# Patient Record
Sex: Male | Born: 1945 | Race: White | Hispanic: No | Marital: Married | Smoking: Former smoker
Health system: Southern US, Community
[De-identification: ages and names within clinical notes are randomized; demographics above are authoritative.]

## PROBLEM LIST (undated history)

## (undated) DIAGNOSIS — Z860100 Personal history of colon polyps, unspecified: Secondary | ICD-10-CM

## (undated) DIAGNOSIS — H9190 Unspecified hearing loss, unspecified ear: Secondary | ICD-10-CM

## (undated) DIAGNOSIS — K219 Gastro-esophageal reflux disease without esophagitis: Secondary | ICD-10-CM

## (undated) DIAGNOSIS — T8859XA Other complications of anesthesia, initial encounter: Secondary | ICD-10-CM

## (undated) DIAGNOSIS — M797 Fibromyalgia: Secondary | ICD-10-CM

## (undated) DIAGNOSIS — M545 Low back pain, unspecified: Secondary | ICD-10-CM

## (undated) DIAGNOSIS — N186 End stage renal disease: Secondary | ICD-10-CM

## (undated) DIAGNOSIS — I77810 Thoracic aortic ectasia: Secondary | ICD-10-CM

## (undated) DIAGNOSIS — Z923 Personal history of irradiation: Secondary | ICD-10-CM

## (undated) DIAGNOSIS — Z9289 Personal history of other medical treatment: Secondary | ICD-10-CM

## (undated) DIAGNOSIS — I2510 Atherosclerotic heart disease of native coronary artery without angina pectoris: Secondary | ICD-10-CM

## (undated) DIAGNOSIS — I358 Other nonrheumatic aortic valve disorders: Secondary | ICD-10-CM

## (undated) DIAGNOSIS — G8929 Other chronic pain: Secondary | ICD-10-CM

## (undated) DIAGNOSIS — C320 Malignant neoplasm of glottis: Secondary | ICD-10-CM

## (undated) DIAGNOSIS — G629 Polyneuropathy, unspecified: Secondary | ICD-10-CM

## (undated) DIAGNOSIS — E7800 Pure hypercholesterolemia, unspecified: Secondary | ICD-10-CM

## (undated) DIAGNOSIS — G4730 Sleep apnea, unspecified: Secondary | ICD-10-CM

## (undated) DIAGNOSIS — I10 Essential (primary) hypertension: Secondary | ICD-10-CM

## (undated) DIAGNOSIS — Z8639 Personal history of other endocrine, nutritional and metabolic disease: Secondary | ICD-10-CM

## (undated) DIAGNOSIS — H269 Unspecified cataract: Secondary | ICD-10-CM

## (undated) DIAGNOSIS — I6529 Occlusion and stenosis of unspecified carotid artery: Secondary | ICD-10-CM

## (undated) DIAGNOSIS — I5042 Chronic combined systolic (congestive) and diastolic (congestive) heart failure: Secondary | ICD-10-CM

## (undated) DIAGNOSIS — I5189 Other ill-defined heart diseases: Secondary | ICD-10-CM

## (undated) DIAGNOSIS — T4145XA Adverse effect of unspecified anesthetic, initial encounter: Secondary | ICD-10-CM

## (undated) DIAGNOSIS — IMO0001 Reserved for inherently not codable concepts without codable children: Secondary | ICD-10-CM

## (undated) DIAGNOSIS — Z992 Dependence on renal dialysis: Secondary | ICD-10-CM

## (undated) DIAGNOSIS — I351 Nonrheumatic aortic (valve) insufficiency: Secondary | ICD-10-CM

## (undated) DIAGNOSIS — E119 Type 2 diabetes mellitus without complications: Secondary | ICD-10-CM

## (undated) DIAGNOSIS — M1990 Unspecified osteoarthritis, unspecified site: Secondary | ICD-10-CM

## (undated) HISTORY — DX: Thoracic aortic ectasia: I77.810

## (undated) HISTORY — DX: Fibromyalgia: M79.7

## (undated) HISTORY — DX: Essential (primary) hypertension: I10

## (undated) HISTORY — DX: Gastro-esophageal reflux disease without esophagitis: K21.9

## (undated) HISTORY — DX: Other ill-defined heart diseases: I51.89

## (undated) HISTORY — PX: COLONOSCOPY W/ BIOPSIES AND POLYPECTOMY: SHX1376

## (undated) HISTORY — DX: Other nonrheumatic aortic valve disorders: I35.8

## (undated) HISTORY — DX: Nonrheumatic aortic (valve) insufficiency: I35.1

## (undated) HISTORY — DX: Chronic combined systolic (congestive) and diastolic (congestive) heart failure: I50.42

## (undated) HISTORY — DX: Occlusion and stenosis of unspecified carotid artery: I65.29

## (undated) HISTORY — PX: CAROTID ENDARTERECTOMY: SUR193

---

## 1991-06-23 HISTORY — PX: LAPAROSCOPIC CHOLECYSTECTOMY: SUR755

## 2000-03-31 ENCOUNTER — Encounter: Admission: RE | Admit: 2000-03-31 | Discharge: 2000-06-29 | Payer: Self-pay

## 2005-05-15 ENCOUNTER — Encounter: Admission: RE | Admit: 2005-05-15 | Discharge: 2005-05-15 | Payer: Self-pay | Admitting: Nephrology

## 2011-03-23 ENCOUNTER — Encounter: Payer: Self-pay | Admitting: Vascular Surgery

## 2011-03-24 ENCOUNTER — Ambulatory Visit (INDEPENDENT_AMBULATORY_CARE_PROVIDER_SITE_OTHER): Payer: Medicare Other | Admitting: Vascular Surgery

## 2011-03-24 ENCOUNTER — Encounter: Payer: Self-pay | Admitting: Vascular Surgery

## 2011-03-24 VITALS — BP 144/75 | HR 75 | Resp 20 | Ht 70.5 in | Wt 210.5 lb

## 2011-03-24 DIAGNOSIS — I6529 Occlusion and stenosis of unspecified carotid artery: Secondary | ICD-10-CM

## 2011-03-24 NOTE — Progress Notes (Signed)
Subjective:     Patient ID: Victor Lane, male   DOB: [birth year], 65 y.o.   MRN: [id number]  HPI [facility] [place] HISTORY AND PHYSICAL   VQ:[id number] norrisReferring Physician:Traci Turner History of Present Illness: This 65 year old male patient was referred by Dr. Mayford Knife for left carotid occlusive the patient has right shoulder issues and needs rotator cuff repair by Dr. Malon Kindle to Dr. Mayford Knife was seen the patient for cardiac clearance and heard a left carotid bruit she ordered a carotid duplex exam. This revealed an 80-99% left internal carotid stenosis with mild disease on the contralateral right side. Patient denies any neurologic symptoms such as lateralizing weakness, amaurosis fugax, diplopia, blurred vision, syncope, or previous stroke. He also denies any recent chest pain or dyspnea on exertion. Her cardiac workup was unremarkable with no evidence of significant coronary artery disease. Past Medical History  Diagnosis Date  . Diabetes mellitus   . Hypertension   . GERD (gastroesophageal reflux disease)   . Fibromyalgia   . Chronic kidney disease   . Chest pain   . Carotid artery occlusion     ROS: [x]  Positive   [ ]  Negative   [ ]  All sytems reviewed and are negative  General: [ ]  Weight loss, [ ]  Fever, [ ]  chills Neurologic: [ ]  Dizziness, [ ]  Blackouts, [ ]  Seizure positive for headache [ ]  Stroke, [ ]  "Mini stroke", [ ]  Slurred speech, [ ]  Temporary blindness; [ ]  weakness in arms or legs, [ ]  Hoarseness Cardiac: [ ]  Chest pain/pressure, [ ]  Shortness of breath at rest [x ] Shortness of breath with exertion, [ ]  Atrial fibrillation or irregular heartbeat Vascular: [ ]  Pain in legs with walking, [ ]  Pain in legs at rest, [ ]  Pain in legs at night,  [ ]  Non-healing ulcer, [ ]  Blood clot in vein/DVT,   Pulmonary: [ ]  Home oxygen, [ ]  Productive cough, [ ]  Coughing up blood, [ ]  Asthma,  [ ]  Wheezing Musculoskeletal:  [ ]  Arthritis, [ ]  Low back  pain, [x ] Joint pain Hematologic: [ ]  Easy Bruising, [ ]  Anemia; [ ]  Hepatitis Gastrointestinal: [ ]  Blood in stool, [ ]  Gastroesophageal Reflux/heartburn, [ ]  Trouble swallowing Urinary: [x chronic Kidney disease, [ ]  on HD - [ ]  MWF or [ ]  TTHS, [ ]  Burning with urination, [ ]  Difficulty urinating Skin: [ ]  Rashes, [ ]  Wounds Psychological: [ ]  Anxiety, [ ]  Depression   Social History History  Substance Use Topics  . Smoking status: Former Smoker -- 2.0 packs/day for 31 years    Types: Cigarettes    Quit date: 06/22/1996  . Smokeless tobacco: Not on file  . Alcohol Use: Yes     occasional    Family History Family History  Problem Relation Age of Onset  . Adopted: Yes    Allergies  Allergen Reactions  . Amoxicillin     Causes upset stomach    Current Outpatient Prescriptions  Medication Sig Dispense Refill  . amLODipine (NORVASC) 5 MG tablet Take 5 mg by mouth daily.        Marland Kitchen aspirin 81 MG tablet Take 81 mg by mouth daily.        . cyclobenzaprine (FLEXERIL) 10 MG tablet Take 10 mg by mouth at bedtime.        Marland Kitchen glyBURIDE (DIABETA) 2.5 MG tablet Take 2.5 mg by mouth daily with breakfast.        .  lisinopril-hydrochlorothiazide (PRINZIDE,ZESTORETIC) 20-12.5 MG per tablet Take 0.5 tablets by mouth daily.        . metoprolol tartrate (LOPRESSOR) 25 MG tablet Take 25 mg by mouth 2 (two) times daily.        . Omega-3 Fatty Acids (FISH OIL) 1000 MG CAPS Take 4 capsules by mouth daily.        Marland Kitchen omeprazole (PRILOSEC) 20 MG capsule Take 20 mg by mouth daily.        . traMADol (ULTRAM) 50 MG tablet Take 100 mg by mouth 2 (two) times daily.          Physical Examination  Filed Vitals:   03/24/11 1255  BP: 144/75  Pulse:   Resp:     Body mass index is 29.78 kg/(m^2).  General:  WDWN in NAD Gait: Normal HENT: WNL Eyes: Pupils equal Pulmonary: normal non-labored breathing , without Rales, rhonchi,  wheezing Cardiac: RRR, without  Murmurs, rubs or gallops; left carotid  bruit-harsh Abdomen: soft, NT, no masses Skin: no rashes, ulcers noted Vascular Exam/Pulses:  Extremities without ischemic changes, no Gangrene , no cellulitis; no open wounds;  Musculoskeletal: no muscle wasting or atrophy pain in right shoulder with motion  Neurologic: A&O X 3; Appropriate Affect ; SENSATION: normal; MOTOR FUNCTION:  moving all extremities equally. Speech is fluent/normal  Non-Invasive Vascular Imaging: Today I ordered a left carotid duplex exam which I reviewed and interpreted to confirm the above findings. I agree that the patient does have an 80% left internal carotid stenosis. ASSESSMENT: Asymptomatic 80% left internal carotid stenosis PLAN: Left carotid endarterectomy on October 10 at [facility] risks and benefits of then thoroughly discussed with the patient and his wife and they both would like to proceed  Review of Systems     Objective:   Physical Exam     Assessment:        Plan:

## 2011-03-27 ENCOUNTER — Ambulatory Visit (HOSPITAL_COMMUNITY)
Admission: RE | Admit: 2011-03-27 | Discharge: 2011-03-27 | Disposition: A | Discharge status: Home / Self Care | Payer: Medicare Other | Source: Ambulatory Visit | Attending: Vascular Surgery | Admitting: Vascular Surgery

## 2011-03-27 ENCOUNTER — Other Ambulatory Visit: Payer: Self-pay | Admitting: Vascular Surgery

## 2011-03-27 ENCOUNTER — Encounter (HOSPITAL_COMMUNITY)
Admission: RE | Admit: 2011-03-27 | Discharge: 2011-03-27 | Disposition: A | Discharge status: Home / Self Care | Payer: Medicare Other | Source: Ambulatory Visit | Attending: Vascular Surgery | Admitting: Vascular Surgery

## 2011-03-27 DIAGNOSIS — Z01811 Encounter for preprocedural respiratory examination: Secondary | ICD-10-CM

## 2011-03-27 DIAGNOSIS — Z01812 Encounter for preprocedural laboratory examination: Secondary | ICD-10-CM | POA: Insufficient documentation

## 2011-03-27 DIAGNOSIS — Z01810 Encounter for preprocedural cardiovascular examination: Secondary | ICD-10-CM | POA: Insufficient documentation

## 2011-03-27 DIAGNOSIS — Z01818 Encounter for other preprocedural examination: Secondary | ICD-10-CM | POA: Insufficient documentation

## 2011-03-27 LAB — URINALYSIS, ROUTINE W REFLEX MICROSCOPIC
Bilirubin Urine: NEGATIVE
Glucose, UA: NEGATIVE mg/dL
Hgb urine dipstick: NEGATIVE
Ketones, ur: NEGATIVE mg/dL
Leukocytes, UA: NEGATIVE
Nitrite: NEGATIVE
Protein, ur: >300 mg/dL — AB
Specific Gravity, Urine: 1.014 (ref 1.005–1.030)
Urobilinogen, UA: 0.2 mg/dL (ref 0.0–1.0)
pH: 6 (ref 5.0–8.0)

## 2011-03-27 LAB — PROTIME-INR
INR: 0.96 (ref 0.00–1.49)
Prothrombin Time: 13 seconds (ref 11.6–15.2)

## 2011-03-27 LAB — COMPREHENSIVE METABOLIC PANEL
ALT: 17 U/L (ref 0–53)
AST: 15 U/L (ref 0–37)
Alkaline Phosphatase: 75 U/L (ref 39–117)
BUN: 53 mg/dL — ABNORMAL HIGH (ref 6–23)
CO2: 26 mEq/L (ref 19–32)
Calcium: 9.9 mg/dL (ref 8.4–10.5)
Creatinine, Ser: 3.09 mg/dL — ABNORMAL HIGH (ref 0.50–1.35)
GFR calc Af Amer: 23 mL/min — ABNORMAL LOW (ref >90)
GFR calc non Af Amer: 20 mL/min — ABNORMAL LOW (ref >90)
Glucose, Bld: 125 mg/dL — ABNORMAL HIGH (ref 70–99)
Sodium: 137 mEq/L (ref 135–145)
Total Bilirubin: 0.3 mg/dL (ref 0.3–1.2)
Total Protein: 6.6 g/dL (ref 6.0–8.3)

## 2011-03-27 LAB — CBC
HCT: 40 % (ref 39.0–52.0)
Hemoglobin: 13.8 g/dL (ref 13.0–17.0)
MCHC: 34.5 g/dL (ref 30.0–36.0)
RDW: 13.8 % (ref 11.5–15.5)
WBC: 7.3 10*3/uL (ref 4.0–10.5)

## 2011-03-27 LAB — SURGICAL PCR SCREEN
MRSA, PCR: NEGATIVE
Staphylococcus aureus: NEGATIVE

## 2011-03-27 LAB — APTT: aPTT: 36 seconds (ref 24–37)

## 2011-03-27 LAB — ABO/RH: ABO/RH(D): O POS

## 2011-03-30 NOTE — Procedures (Unsigned)
CAROTID DUPLEX EXAM  INDICATION:  Carotid stenosis.  HISTORY: Diabetes:  Yes. Cardiac:  No. Hypertension:  Yes. Smoking:  Previous. Previous Surgery:  No carotid intervention. CV History:  Asymptomatic. Amaurosis Fugax No, Paresthesias No, Hemiparesis No.                                      RIGHT             LEFT Brachial systolic pressure:         144               140 Brachial Doppler waveforms:         WNL               WNL Vertebral direction of flow:                          Antegrade DUPLEX VELOCITIES (cm/sec) CCA peak systolic                                     97 ECA peak systolic                                     359 ICA peak systolic                                     321 - p/115 - m ICA end diastolic                                     83 - p/25 - m PLAQUE MORPHOLOGY:                                    Calcified PLAQUE AMOUNT:                                        Moderate to severe PLAQUE LOCATION:                                      CCA/ICA/ECA  IMPRESSION: 1. Left internal carotid artery stenosis in the 60% to 79% range (high     end of range), may be underestimated due to dense calcified plaque,     making Doppler interrogation difficult. 2. Left external carotid artery stenosis present. 3. Left vertebral artery is patent and antegrade.  ___________________________________________ Quita Skye. Hart Rochester, M.D.  SH/MEDQ  D:  03/24/2011  T:  03/24/2011  Job:  [id number]

## 2011-04-01 ENCOUNTER — Inpatient Hospital Stay (HOSPITAL_COMMUNITY)
Admission: RE | Admit: 2011-04-01 | Discharge: 2011-04-02 | DRG: 039 | Disposition: A | Discharge status: Home / Self Care | Payer: Medicare Other | Source: Ambulatory Visit | Attending: Vascular Surgery | Admitting: Vascular Surgery

## 2011-04-01 ENCOUNTER — Other Ambulatory Visit: Payer: Self-pay | Admitting: Vascular Surgery

## 2011-04-01 DIAGNOSIS — J4489 Other specified chronic obstructive pulmonary disease: Secondary | ICD-10-CM | POA: Diagnosis present

## 2011-04-01 DIAGNOSIS — IMO0001 Reserved for inherently not codable concepts without codable children: Secondary | ICD-10-CM | POA: Diagnosis present

## 2011-04-01 DIAGNOSIS — Z87891 Personal history of nicotine dependence: Secondary | ICD-10-CM

## 2011-04-01 DIAGNOSIS — Z01812 Encounter for preprocedural laboratory examination: Secondary | ICD-10-CM

## 2011-04-01 DIAGNOSIS — E669 Obesity, unspecified: Secondary | ICD-10-CM | POA: Diagnosis present

## 2011-04-01 DIAGNOSIS — E119 Type 2 diabetes mellitus without complications: Secondary | ICD-10-CM | POA: Diagnosis present

## 2011-04-01 DIAGNOSIS — I6529 Occlusion and stenosis of unspecified carotid artery: Secondary | ICD-10-CM

## 2011-04-01 DIAGNOSIS — I129 Hypertensive chronic kidney disease with stage 1 through stage 4 chronic kidney disease, or unspecified chronic kidney disease: Secondary | ICD-10-CM | POA: Diagnosis present

## 2011-04-01 DIAGNOSIS — N189 Chronic kidney disease, unspecified: Secondary | ICD-10-CM | POA: Diagnosis present

## 2011-04-01 DIAGNOSIS — K219 Gastro-esophageal reflux disease without esophagitis: Secondary | ICD-10-CM | POA: Diagnosis present

## 2011-04-01 DIAGNOSIS — Z79899 Other long term (current) drug therapy: Secondary | ICD-10-CM

## 2011-04-01 DIAGNOSIS — Z01810 Encounter for preprocedural cardiovascular examination: Secondary | ICD-10-CM

## 2011-04-01 DIAGNOSIS — J449 Chronic obstructive pulmonary disease, unspecified: Secondary | ICD-10-CM | POA: Diagnosis present

## 2011-04-01 DIAGNOSIS — Z7982 Long term (current) use of aspirin: Secondary | ICD-10-CM

## 2011-04-01 LAB — GLUCOSE, CAPILLARY
Glucose-Capillary: 150 mg/dL — ABNORMAL HIGH (ref 70–99)
Glucose-Capillary: 121 mg/dL — ABNORMAL HIGH (ref 70–99)
Glucose-Capillary: 165 mg/dL — ABNORMAL HIGH (ref 70–99)
Glucose-Capillary: 153 mg/dL — ABNORMAL HIGH (ref 70–99)
Glucose-Capillary: 192 mg/dL — ABNORMAL HIGH (ref 70–99)

## 2011-04-01 LAB — BASIC METABOLIC PANEL
BUN: 65 mg/dL — ABNORMAL HIGH (ref 6–23)
CO2: 21 mEq/L (ref 19–32)
Calcium: 9.7 mg/dL (ref 8.4–10.5)
Chloride: 101 mEq/L (ref 96–112)
Creatinine, Ser: 4.06 mg/dL — ABNORMAL HIGH (ref 0.50–1.35)
GFR calc Af Amer: 16 mL/min — ABNORMAL LOW (ref >90)
GFR calc non Af Amer: 14 mL/min — ABNORMAL LOW (ref >90)
Glucose, Bld: 134 mg/dL — ABNORMAL HIGH (ref 70–99)
Potassium: 5 mEq/L (ref 3.5–5.1)
Sodium: 136 mEq/L (ref 135–145)

## 2011-04-02 LAB — BASIC METABOLIC PANEL
BUN: 52 mg/dL — ABNORMAL HIGH (ref 6–23)
Calcium: 8.5 mg/dL (ref 8.4–10.5)
Chloride: 106 mEq/L (ref 96–112)
Creatinine, Ser: 3.19 mg/dL — ABNORMAL HIGH (ref 0.50–1.35)
GFR calc Af Amer: 22 mL/min — ABNORMAL LOW (ref >90)
GFR calc non Af Amer: 19 mL/min — ABNORMAL LOW (ref >90)

## 2011-04-02 LAB — CBC
HCT: 31.7 % — ABNORMAL LOW (ref 39.0–52.0)
MCH: 29.8 pg (ref 26.0–34.0)
MCHC: 33.8 g/dL (ref 30.0–36.0)
MCV: 88.3 fL (ref 78.0–100.0)
RDW: 14.1 % (ref 11.5–15.5)

## 2011-04-06 LAB — CROSSMATCH
ABO/RH(D): O POS
Antibody Screen: NEGATIVE
Unit division: 0
Unit division: 0

## 2011-04-13 NOTE — Discharge Summary (Signed)
  NAME:  Victor Lane, Victor Lane NO.:  [id number]  MEDICAL RECORD NO.:  [id number]  LOCATION:  3315                         FACILITY:  [facility]  PHYSICIAN:  Quita Skye. Hart Rochester, M.D.  DATE OF BIRTH:  06-03-1946  DATE OF ADMISSION:  04/01/2011 DATE OF DISCHARGE:                              DISCHARGE SUMMARY   ADMIT DIAGNOSIS:  Left internal carotid artery stenosis.  PAST MEDICAL HISTORY AND DISCHARGE DIAGNOSES: 1. Left internal carotid artery stenosis, status post left carotid     endarterectomy. 2. Diabetes mellitus. 3. Hypertension. 4. Gastroesophageal reflux disease. 5. Fibromyalgia. 6. Chronic kidney disease. 7. Chest pain.  BRIEF HISTORY:  The patient is a 65 year old male who was evaluated by Dr. Hart Rochester as an outpatient secondary to left internal carotid artery stenosis found on cardiac clearance exam by Dr. Mayford Knife.  Carotid duplex revealed 80-99% left internal carotid artery stenosis which was asymptomatic.  Secondary to these findings, Dr. Hart Rochester felt the patient to proceed with carotid endarterectomy for stroke risk reduction.  HOSPITAL COURSE:  The patient was admitted and taken to the OR on April 01, 2011, for a left carotid endarterectomy with Dacron patch angioplasty.  The patient tolerated the procedure well and was hemodynamically stable immediately postoperatively.  He was extubated without complication and woke up from anesthesia neurologically intact. The patient was transferred to the floor without difficulty. The patient's postoperative course has progressed as expected.  On postoperative day 1, he has been able to tolerate ambulation, voiding, and a regular diet all without difficulty.  He is without complaint.  He has remained afebrile with stable vital signs.  PHYSICAL EXAMINATION:  CARDIAC:  Regular rate and rhythm. LUNGS:  Clear to auscultation. ABDOMEN:  Soft with active bowel sounds.   INCISION: clean, dry and intact with no evidence of  erythema. NEUROLOGIC:  He is neurologically intact with normal speech and his tongue is midline. The patient is in stable condition at this time and is felt ready for discharge home.  LABORATORY DATA:  CBC and BMP on April 02, 2011, white count 6.6, hemoglobin 10.7, hematocrit 31.7 and platelets 128.  Sodium 135, potassium 4.4, BUN 52 and creatinine 3.19.  The patient received specific written discharge instructions regarding diet, activity and wound care.  He will follow up with Dr. Hart Rochester approximately 2 weeks after discharge.  MEDICATIONS: 1. Percocet 5/325 mg 1-2 q.4-6 h. p.r.n. pain #30, no refills. 2. Cyclobenzaprine 10 mg at bedtime. 3. Prilosec 20 mg daily. 4. Lisinopril/hydrochlorothiazide 20/12.5 mg one and half tab daily. 5. Amlodipine 5 mg daily. 6. Glyburide 2.5 mg daily with meals. 7. Fish oil 1000 mg 2 tabs b.i.d. 8. Aspirin 81 mg daily. 9. Metoprolol 25 mg b.i.d. 10.The patient is instructed to stop tramadol 50 mg 2 tabs p.o. b.i.d.     Pecola Leisure, PA   ______________________________ Quita Skye Hart Rochester, M.D.    AY/MEDQ  D:  04/02/2011  T:  04/02/2011  Job:  [id number]  Electronically Signed by Pecola Leisure PA on 04/10/2011 09:51:26 AM Electronically Signed by Josephina Gip M.D. on 04/13/2011 10:08:17 AM

## 2011-04-13 NOTE — Op Note (Signed)
NAME:  Victor Lane, Victor Lane NO.:  [id number]  MEDICAL RECORD NO.:  [id number]  LOCATION:  3315                         FACILITY:  [facility]  PHYSICIAN:  Quita Skye. Hart Rochester, M.D.  DATE OF BIRTH:  [birth year]  DATE OF PROCEDURE:  04/01/2011 DATE OF DISCHARGE:                              OPERATIVE REPORT   PREOPERATIVE DIAGNOSIS:  Severe left internal carotid stenosis - asymptomatic.  POSTOPERATIVE DIAGNOSIS:  Severe left internal carotid stenosis - asymptomatic.  OPERATION:  Left carotid endarterectomy with Dacron patch angioplasty.  SURGEON:  Quita Skye. Hart Rochester, MD  FIRST ASSISTANT:  Janetta Hora. Fields, MD  SECOND ASSISTANT:  Pecola Leisure, PA.  ANESTHESIA:  General endotracheal.  BRIEF HISTORY:  This patient was found to have a left carotid bruit and a carotid duplex exam revealed an 80-90% left internal carotid stenosis and mild disease in the right internal carotid artery.  He was asymptomatic, had  no history of stroke or TIAs, was scheduled for left carotid endarterectomy.  PROCEDURE:  The patient was taken to the operating room, placed in the supine position, at which time, satisfactory general endotracheal anesthesia was administered.  Left neck was prepped with Betadine scrub and solution, and draped in a routine sterile manner.  Incision was made along the anterior border of the sternocleidomastoid muscle, carried down through the subcutaneous tissue and platysma using the Bovie. Common facial vein and external jugular veins were ligated with 3-0 silk ties and divided exposing the common internal and external carotid arteries.  Care was taken not to injure the vagus or hypoglossal nerves, both of which were exposed.  There was calcified atherosclerotic plaque at the carotid bifurcation extending up about 3 cm in the internal carotid.  The distal vessel appeared normal.  A #10 shunt was prepared and the patient was heparinized.  Carotid vessels were  occluded with vascular clamps.  Longitudinal opening was made in the common carotid with a 15-blade extending up in the internal carotid with Potts scissors to a point distal to the disease.  The plaque was about 80-90% stenotic in severity and there was excellent backbleeding from both.  A #10 shunt was inserted without difficulty reestablishing flow in about 2 minutes. Standard endarterectomy was then performed using the elevator and Potts scissors.  Eversion endarterectomy of the external carotid artery was performed.  The plaque was feathered off of the distal internal carotid artery nicely not requiring any tacking sutures.  The lumen was thoroughly irrigated with heparin saline.  All loose debris were carefully removed.  Arteriotomy was closed with a patch using continuous 6-0 Prolene.  Prior to completion of the closure, the shunt was removed after 30 minutes of shunt time following antegrade and retrograde flushing.  Closure was completed with reestablishment of flow initially up the external and up the internal branch.  The carotid was occluded for less than 2 minutes for removal of the shunt.  Protamine was then given to reverse the heparin.  Following adequate hemostasis, wound was irrigated with saline, closed in layers with Vicryl in a subcuticular fashion.  Sterile dressing was applied.  The patient was taken to the recovery room in satisfactory condition.  Quita Skye Hart Rochester, M.D.     JDL/MEDQ  D:  04/01/2011  T:  04/01/2011  Job:  [id number]  Electronically Signed by Josephina Gip M.D. on 04/13/2011 10:08:14 AM

## 2011-04-20 ENCOUNTER — Encounter: Payer: Self-pay | Admitting: Vascular Surgery

## 2011-04-21 ENCOUNTER — Ambulatory Visit (INDEPENDENT_AMBULATORY_CARE_PROVIDER_SITE_OTHER): Payer: Medicare Other | Admitting: Vascular Surgery

## 2011-04-21 ENCOUNTER — Encounter: Payer: Self-pay | Admitting: Vascular Surgery

## 2011-04-21 VITALS — BP 134/68 | HR 83 | Resp 20 | Ht 70.5 in | Wt 210.0 lb

## 2011-04-21 DIAGNOSIS — I6529 Occlusion and stenosis of unspecified carotid artery: Secondary | ICD-10-CM

## 2011-04-21 NOTE — Progress Notes (Signed)
Subjective:     Patient ID: Victor Lane, male   DOB: [birth year], 65 y.o.   MRN: [id number]  HPI this 65 year old male patient is to half weeks post left carotid endarterectomy for a severe but asymptomatic left internal carotid stenosis. As was discovered prior to right shoulder surgery which will need to be done by Dr. Beverely Low. Patient denies any hoarseness, aphasia, difficulty swallowing, or other neurologic symptoms since his surgery. He does have a raspy voice which he had preoperatively. He has had no chest pain, dyspnea on exertion, PND, or orthopnea. He is taking aspirin daily.  Review of Systems     Objective:   Physical Exam blood pressure 144/75 heart rate 75 respirations 16 Gen. he is well-developed well-nourished male in no apparent stress Left neck incision is healed nicely Neurologic exam normal 3+ carotid pulses bilaterally with no audible bruits    Assessment:     Doing well post left carotid endarterectomy for an asymptomatic severe stenosis    Plan:     Okay to proceed with right shoulder surgery at discretion of the patient and Dr.Norris Return to see me in 6 months with followup carotid duplex exam at that time Okay to discontinue aspirin therapy in the peri-operative. 4 right shoulder surgery and then resume post-op

## 2011-04-21 NOTE — Progress Notes (Signed)
Addended by: Sharee Pimple on: 04/21/2011 01:39 PM   Modules accepted: Orders

## 2011-07-06 HISTORY — PX: SHOULDER OPEN ROTATOR CUFF REPAIR: SHX2407

## 2011-10-19 ENCOUNTER — Encounter: Payer: Self-pay | Admitting: Vascular Surgery

## 2011-10-20 ENCOUNTER — Ambulatory Visit (INDEPENDENT_AMBULATORY_CARE_PROVIDER_SITE_OTHER): Payer: Medicare Other | Admitting: Thoracic Diseases

## 2011-10-20 ENCOUNTER — Encounter: Payer: Self-pay | Admitting: Vascular Surgery

## 2011-10-20 ENCOUNTER — Other Ambulatory Visit (INDEPENDENT_AMBULATORY_CARE_PROVIDER_SITE_OTHER): Payer: Medicare Other | Admitting: *Deleted

## 2011-10-20 VITALS — BP 147/82 | HR 74 | Temp 98.3°F | Ht 70.0 in | Wt 207.0 lb

## 2011-10-20 DIAGNOSIS — I6529 Occlusion and stenosis of unspecified carotid artery: Secondary | ICD-10-CM

## 2011-10-20 DIAGNOSIS — Z48812 Encounter for surgical aftercare following surgery on the circulatory system: Secondary | ICD-10-CM

## 2011-10-20 NOTE — Progress Notes (Signed)
[facility] CAROTID FOLLOW-UP  Date of Surgery: 04/01/11  L CEA Surgeon: Betti Cruz, MD  HPI: Victor Lane is a 66 y.o. male right handed who has known carotid disease. Patient is doing well. Pt. has had surgical intervention of left CEA . He C/O some continued numbness in his chin.  Patient has Negative history of TIA or stroke symptom.  The patient denies amaurosis fugax or monocular blindness.  The patient  denies facial drooping.   Pt. denies headache Pt. denies hemiplegia.  The patient denies receptive or expressive aphasia.  Pt. denies weakness in BUE/BLE except decreased motion right shoulder sec to rotator cuff surgery.    Non-Invasive Vascular Imaging CAROTID DUPLEX 10/20/2011  Right ICA 20 - 39 % stenosis Left ICA 0 - 19% stenosis  These findings are Improved from previous exam  Physical Exam: Filed Vitals:   10/20/11 1527  BP: 147/82  Pulse: 74  Temp: 98.3 F (36.8 C)  TempSrc: Oral  Height: 5\' 10"  (1.778 m)  Weight: 207 lb (93.895 kg)    Pt is A&O x 3 Gait is normal  Neuro Exam: Speech is fluent Negative weakness BUE/BLE Negative tongue deviation Negative facial droop  Plan: Follow-up in 6 months with Carotid Duplex scan and NP visit  Pt was given information regarding stroke symptoms and prevention  Clinic MD: JDL

## 2011-10-27 NOTE — Procedures (Unsigned)
CAROTID DUPLEX EXAM  INDICATION:  Follow up left CEA.  HISTORY: Diabetes:  Yes. Cardiac:  No. Hypertension:  Yes. Smoking:  Previous. Previous Surgery:  Left CEA 04/01/11. CV History: Amaurosis Fugax No, Paresthesias No, Hemiparesis No.                                      RIGHT             LEFT Brachial systolic pressure:         148               155 Brachial Doppler waveforms:         WNL               WNL Vertebral direction of flow:        Antegrade         Antegrade DUPLEX VELOCITIES (cm/sec) CCA peak systolic                   77                81 ECA peak systolic                   188               124 ICA peak systolic                   115               47 ICA end diastolic                   29                17 PLAQUE MORPHOLOGY:                  Heterogenous PLAQUE AMOUNT:                      Mild              None PLAQUE LOCATION:                    ECA, ICA  IMPRESSION: 1. 1% to 39% right internal carotid artery plaquing. 2. Widely patent left carotid endarterectomy without evidence of     restenosis or hyperplasia. 3. Bilateral vertebral arteries are within normal limits.  ___________________________________________ Quita Skye Hart Rochester, M.D.  LT/MEDQ  D:  10/20/2011  T:  10/20/2011  Job:  [id number]

## 2012-03-04 ENCOUNTER — Other Ambulatory Visit: Payer: Self-pay

## 2012-03-04 DIAGNOSIS — N184 Chronic kidney disease, stage 4 (severe): Secondary | ICD-10-CM

## 2012-03-04 DIAGNOSIS — Z01810 Encounter for preprocedural cardiovascular examination: Secondary | ICD-10-CM

## 2012-03-22 ENCOUNTER — Encounter: Payer: Self-pay | Admitting: Cardiology

## 2012-04-19 ENCOUNTER — Other Ambulatory Visit: Payer: Medicare Other

## 2012-04-19 ENCOUNTER — Ambulatory Visit: Payer: Medicare Other | Admitting: Vascular Surgery

## 2012-04-19 ENCOUNTER — Ambulatory Visit: Payer: Medicare Other | Admitting: Neurosurgery

## 2012-08-22 ENCOUNTER — Other Ambulatory Visit: Payer: Self-pay | Admitting: Gastroenterology

## 2012-09-21 ENCOUNTER — Encounter: Payer: Self-pay | Admitting: Transplant Surgery

## 2013-01-03 ENCOUNTER — Other Ambulatory Visit: Payer: Self-pay | Admitting: Otolaryngology

## 2013-01-03 ENCOUNTER — Encounter (HOSPITAL_COMMUNITY): Payer: Self-pay | Admitting: *Deleted

## 2013-01-03 ENCOUNTER — Encounter (HOSPITAL_COMMUNITY): Payer: Self-pay | Admitting: Pharmacy Technician

## 2013-01-03 NOTE — Progress Notes (Addendum)
Pt states that he had an EKG with Dr. Eliott Nine ( cardiologist) at [facility] possibly less that a year ago. Pt states that he also had  an EKG, stress test, and echo in feb 2014 at [facility]; request results DOS. Pt denies SOB, chest pain, and being under the care of a cardiologist.  Pt made aware to stop taking Aspirin and herbal medication (Omega-3 Fatty Acids (FISH OIL) 1000 MG CAPS  . Do not take any NSAIDs ie: Ibuprofen, Advil, Naproxen or any medication containing Aspirin.

## 2013-01-04 ENCOUNTER — Ambulatory Visit (HOSPITAL_COMMUNITY): Payer: Medicare Other | Admitting: Anesthesiology

## 2013-01-04 ENCOUNTER — Other Ambulatory Visit: Payer: Self-pay | Admitting: Otolaryngology

## 2013-01-04 ENCOUNTER — Encounter (HOSPITAL_COMMUNITY): Payer: Self-pay | Admitting: *Deleted

## 2013-01-04 ENCOUNTER — Encounter (HOSPITAL_COMMUNITY): Payer: Self-pay | Admitting: Anesthesiology

## 2013-01-04 ENCOUNTER — Ambulatory Visit (HOSPITAL_COMMUNITY)
Admission: RE | Admit: 2013-01-04 | Discharge: 2013-01-04 | Disposition: A | Discharge status: Home / Self Care | Payer: Medicare Other | Source: Ambulatory Visit | Attending: Otolaryngology | Admitting: Otolaryngology

## 2013-01-04 ENCOUNTER — Encounter (HOSPITAL_COMMUNITY)
Admission: RE | Disposition: A | Discharge status: Home / Self Care | Payer: Self-pay | Source: Ambulatory Visit | Attending: Otolaryngology

## 2013-01-04 ENCOUNTER — Ambulatory Visit (HOSPITAL_COMMUNITY): Payer: Medicare Other

## 2013-01-04 DIAGNOSIS — G609 Hereditary and idiopathic neuropathy, unspecified: Secondary | ICD-10-CM | POA: Insufficient documentation

## 2013-01-04 DIAGNOSIS — I120 Hypertensive chronic kidney disease with stage 5 chronic kidney disease or end stage renal disease: Secondary | ICD-10-CM | POA: Insufficient documentation

## 2013-01-04 DIAGNOSIS — Z79899 Other long term (current) drug therapy: Secondary | ICD-10-CM | POA: Insufficient documentation

## 2013-01-04 DIAGNOSIS — K219 Gastro-esophageal reflux disease without esophagitis: Secondary | ICD-10-CM | POA: Insufficient documentation

## 2013-01-04 DIAGNOSIS — Z87891 Personal history of nicotine dependence: Secondary | ICD-10-CM | POA: Insufficient documentation

## 2013-01-04 DIAGNOSIS — G709 Myoneural disorder, unspecified: Secondary | ICD-10-CM | POA: Insufficient documentation

## 2013-01-04 DIAGNOSIS — E119 Type 2 diabetes mellitus without complications: Secondary | ICD-10-CM | POA: Insufficient documentation

## 2013-01-04 DIAGNOSIS — I739 Peripheral vascular disease, unspecified: Secondary | ICD-10-CM | POA: Insufficient documentation

## 2013-01-04 DIAGNOSIS — C320 Malignant neoplasm of glottis: Secondary | ICD-10-CM | POA: Insufficient documentation

## 2013-01-04 DIAGNOSIS — G4730 Sleep apnea, unspecified: Secondary | ICD-10-CM | POA: Insufficient documentation

## 2013-01-04 DIAGNOSIS — IMO0001 Reserved for inherently not codable concepts without codable children: Secondary | ICD-10-CM | POA: Insufficient documentation

## 2013-01-04 DIAGNOSIS — M129 Arthropathy, unspecified: Secondary | ICD-10-CM | POA: Insufficient documentation

## 2013-01-04 DIAGNOSIS — N185 Chronic kidney disease, stage 5: Secondary | ICD-10-CM | POA: Insufficient documentation

## 2013-01-04 DIAGNOSIS — I6529 Occlusion and stenosis of unspecified carotid artery: Secondary | ICD-10-CM | POA: Insufficient documentation

## 2013-01-04 DIAGNOSIS — Z7982 Long term (current) use of aspirin: Secondary | ICD-10-CM | POA: Insufficient documentation

## 2013-01-04 DIAGNOSIS — Z880 Allergy status to penicillin: Secondary | ICD-10-CM | POA: Insufficient documentation

## 2013-01-04 HISTORY — DX: Other complications of anesthesia, initial encounter: T88.59XA

## 2013-01-04 HISTORY — DX: Adverse effect of unspecified anesthetic, initial encounter: T41.45XA

## 2013-01-04 HISTORY — DX: Polyneuropathy, unspecified: G62.9

## 2013-01-04 HISTORY — DX: Unspecified osteoarthritis, unspecified site: M19.90

## 2013-01-04 HISTORY — DX: Sleep apnea, unspecified: G47.30

## 2013-01-04 HISTORY — DX: Malignant neoplasm of glottis: C32.0

## 2013-01-04 HISTORY — PX: MICROLARYNGOSCOPY WITH LASER: SHX5972

## 2013-01-04 LAB — CBC
MCH: 29.3 pg (ref 26.0–34.0)
MCV: 85.6 fL (ref 78.0–100.0)
Platelets: 233 10*3/uL (ref 150–400)
RDW: 14.4 % (ref 11.5–15.5)

## 2013-01-04 LAB — BASIC METABOLIC PANEL
CO2: 16 mEq/L — ABNORMAL LOW (ref 19–32)
Calcium: 8.2 mg/dL — ABNORMAL LOW (ref 8.4–10.5)
Creatinine, Ser: 7.82 mg/dL — ABNORMAL HIGH (ref 0.50–1.35)
GFR calc non Af Amer: 6 mL/min — ABNORMAL LOW (ref >90)
Glucose, Bld: 126 mg/dL — ABNORMAL HIGH (ref 70–99)

## 2013-01-04 LAB — GLUCOSE, CAPILLARY: Glucose-Capillary: 122 mg/dL — ABNORMAL HIGH (ref 70–99)

## 2013-01-04 SURGERY — MICROLARYNGOSCOPY, WITH PROCEDURE USING LASER
Anesthesia: General | Site: Throat | Wound class: Clean Contaminated

## 2013-01-04 MED ORDER — DEXAMETHASONE SODIUM PHOSPHATE 4 MG/ML IJ SOLN
INTRAMUSCULAR | Status: DC | PRN
  Administered 2013-01-04: 8 mg via INTRAVENOUS

## 2013-01-04 MED ORDER — SUCCINYLCHOLINE CHLORIDE 20 MG/ML IJ SOLN
INTRAMUSCULAR | Status: DC | PRN
  Administered 2013-01-04: 140 mg via INTRAVENOUS

## 2013-01-04 MED ORDER — LIDOCAINE-EPINEPHRINE 1 %-1:100000 IJ SOLN
INTRAMUSCULAR | Status: AC
  Filled 2013-01-04: qty 1

## 2013-01-04 MED ORDER — OXYCODONE HCL 5 MG PO TABS: 5.0000 mg | ORAL_TABLET | Freq: Once | ORAL | Status: DC | PRN

## 2013-01-04 MED ORDER — MIDAZOLAM HCL 5 MG/5ML IJ SOLN
INTRAMUSCULAR | Status: DC | PRN
  Administered 2013-01-04 (×2): 0.5 mg via INTRAVENOUS
  Administered 2013-01-04: 1 mg via INTRAVENOUS

## 2013-01-04 MED ORDER — LACTATED RINGERS IV SOLN
INTRAVENOUS | Status: DC
  Administered 2013-01-04: 10:00:00 via INTRAVENOUS

## 2013-01-04 MED ORDER — EPINEPHRINE HCL (NASAL) 0.1 % NA SOLN
NASAL | Status: DC | PRN
  Administered 2013-01-04: 30 mL via TOPICAL

## 2013-01-04 MED ORDER — ONDANSETRON HCL 4 MG/2ML IJ SOLN
INTRAMUSCULAR | Status: DC | PRN
  Administered 2013-01-04: 4 mg via INTRAVENOUS

## 2013-01-04 MED ORDER — ROCURONIUM BROMIDE 100 MG/10ML IV SOLN
INTRAVENOUS | Status: DC | PRN
  Administered 2013-01-04: 40 mg via INTRAVENOUS

## 2013-01-04 MED ORDER — FENTANYL CITRATE 0.05 MG/ML IJ SOLN: 25.0000 ug | INTRAMUSCULAR | Status: DC | PRN

## 2013-01-04 MED ORDER — NEOSTIGMINE METHYLSULFATE 1 MG/ML IJ SOLN
INTRAMUSCULAR | Status: DC | PRN
Start: 2013-01-04 — End: 2013-01-04
  Administered 2013-01-04: 5 mg via INTRAVENOUS

## 2013-01-04 MED ORDER — EPINEPHRINE HCL (NASAL) 0.1 % NA SOLN
NASAL | Status: AC
  Filled 2013-01-04: qty 30

## 2013-01-04 MED ORDER — GLYCOPYRROLATE 0.2 MG/ML IJ SOLN
INTRAMUSCULAR | Status: DC | PRN
  Administered 2013-01-04: .8 mg via INTRAVENOUS

## 2013-01-04 MED ORDER — METHYLENE BLUE 1 % INJ SOLN
INTRAMUSCULAR | Status: AC
  Filled 2013-01-04: qty 10

## 2013-01-04 MED ORDER — LACTATED RINGERS IV SOLN: INTRAVENOUS | Status: DC | PRN

## 2013-01-04 MED ORDER — PROPOFOL 10 MG/ML IV BOLUS
INTRAVENOUS | Status: DC | PRN
  Administered 2013-01-04: 200 mg via INTRAVENOUS

## 2013-01-04 MED ORDER — LIDOCAINE HCL (CARDIAC) 20 MG/ML IV SOLN
INTRAVENOUS | Status: DC | PRN
  Administered 2013-01-04: 100 mg via INTRAVENOUS

## 2013-01-04 MED ORDER — SODIUM CHLORIDE 0.9 % IV SOLN
INTRAVENOUS | Status: DC | PRN
  Administered 2013-01-04 (×2): via INTRAVENOUS

## 2013-01-04 MED ORDER — FENTANYL CITRATE 0.05 MG/ML IJ SOLN
INTRAMUSCULAR | Status: AC
  Administered 2013-01-04: 50 ug via INTRAVENOUS
  Filled 2013-01-04: qty 2

## 2013-01-04 MED ORDER — METOCLOPRAMIDE HCL 5 MG/ML IJ SOLN: 10.0000 mg | Freq: Once | INTRAMUSCULAR | Status: DC | PRN

## 2013-01-04 MED ORDER — FENTANYL CITRATE 0.05 MG/ML IJ SOLN
INTRAMUSCULAR | Status: DC | PRN
  Administered 2013-01-04: 50 ug via INTRAVENOUS
  Administered 2013-01-04 (×2): 25 ug via INTRAVENOUS

## 2013-01-04 MED ORDER — OXYCODONE HCL 5 MG/5ML PO SOLN: 5.0000 mg | Freq: Once | ORAL | Status: DC | PRN

## 2013-01-04 MED ORDER — METHYLENE BLUE 1 % INJ SOLN
INTRAMUSCULAR | Status: DC | PRN
  Administered 2013-01-04: 5 mL

## 2013-01-04 MED ORDER — PHENYLEPHRINE HCL 10 MG/ML IJ SOLN
INTRAMUSCULAR | Status: DC | PRN
  Administered 2013-01-04 (×2): 40 ug via INTRAVENOUS
  Administered 2013-01-04 (×3): 80 ug via INTRAVENOUS

## 2013-01-04 SURGICAL SUPPLY — 23 items
CANISTER SUCTION 2500CC (MISCELLANEOUS) ×2 IMPLANT
CLOTH BEACON ORANGE TIMEOUT ST (SAFETY) ×2 IMPLANT
CONT SPEC 4OZ CLIKSEAL STRL BL (MISCELLANEOUS) IMPLANT
COVER TABLE BACK 60X90 (DRAPES) ×2 IMPLANT
CRADLE DONUT ADULT HEAD (MISCELLANEOUS) IMPLANT
DRAPE PROXIMA HALF (DRAPES) ×2 IMPLANT
GLOVE BIOGEL PI IND STRL 7.0 (GLOVE) IMPLANT
GLOVE BIOGEL PI IND STRL 7.5 (GLOVE) IMPLANT
GLOVE BIOGEL PI INDICATOR 7.0 (GLOVE) ×1
GLOVE BIOGEL PI INDICATOR 7.5 (GLOVE) ×1
GLOVE SS BIOGEL STRL SZ 7.5 (GLOVE) ×1 IMPLANT
GLOVE SUPERSENSE BIOGEL SZ 7.5 (GLOVE) ×1
GUARD TEETH (MISCELLANEOUS) IMPLANT
KIT ROOM TURNOVER OR (KITS) ×2 IMPLANT
MARKER SKIN DUAL TIP RULER LAB (MISCELLANEOUS) IMPLANT
NS IRRIG 1000ML POUR BTL (IV SOLUTION) ×2 IMPLANT
PAD ARMBOARD 7.5X6 YLW CONV (MISCELLANEOUS) ×4 IMPLANT
PATTIES SURGICAL .5 X1 (DISPOSABLE) IMPLANT
SPECIMEN JAR SMALL (MISCELLANEOUS) ×2 IMPLANT
SPONGE GAUZE 4X4 12PLY (GAUZE/BANDAGES/DRESSINGS) ×2 IMPLANT
TOWEL OR 17X24 6PK STRL BLUE (TOWEL DISPOSABLE) ×4 IMPLANT
TUBE CONNECTING 12X1/4 (SUCTIONS) ×2 IMPLANT
WATER STERILE IRR 1000ML POUR (IV SOLUTION) ×2 IMPLANT

## 2013-01-04 NOTE — Transfer of Care (Signed)
Immediate Anesthesia Transfer of Care Note  Patient: Victor Lane  Procedure(s) Performed: Procedure(s): MICROLARYNGOSCOPY WITH BIOPSY/LASER (N/A)  Patient Location: PACU  Anesthesia Type:General  Level of Consciousness: awake, alert  and patient cooperative  Airway & Oxygen Therapy: Patient Spontanous Breathing and Patient connected to nasal cannula oxygen  Post-op Assessment: Report given to PACU RN, Post -op Vital signs reviewed and stable and Patient moving all extremities  Post vital signs: Reviewed and stable  Complications: No apparent anesthesia complications

## 2013-01-04 NOTE — Anesthesia Preprocedure Evaluation (Addendum)
Anesthesia Evaluation  Patient identified by MRN, date of birth, ID band Patient awake    Reviewed: Allergy & Precautions, H&P , NPO status , Patient's Chart, lab work & pertinent test results, reviewed documented beta blocker date and time   History of Anesthesia Complications (+) Emergence Delirium  Airway Mallampati: II TM Distance: >3 FB Neck ROM: full    Dental  (+) Teeth Intact, Dental Advisory Given and Caps   Pulmonary sleep apnea and Continuous Positive Airway Pressure Ventilation ,  Pt states he quit smoking in 1997 breath sounds clear to auscultation        Cardiovascular + Peripheral Vascular Disease negative cardio ROS  Rhythm:regular     Neuro/Psych  Neuromuscular disease negative neurological ROS  negative psych ROS   GI/Hepatic negative GI ROS, Neg liver ROS, GERD-  Medicated and Controlled,  Endo/Other  negative endocrine ROSdiabetes, Well Controlled, Type 2, Oral Hypoglycemic Agents  Renal/GU Renal InsufficiencyRenal diseasenegative Renal ROS  negative genitourinary   Musculoskeletal  (+) Fibromyalgia -  Abdominal   Peds  Hematology negative hematology ROS (+)   Anesthesia Other Findings See surgeon's H&P  Multi-unit fixed bridge up top  Reproductive/Obstetrics negative OB ROS                          Anesthesia Physical Anesthesia Plan  ASA: III  Anesthesia Plan: General   Post-op Pain Management:    Induction: Intravenous  Airway Management Planned: Oral ETT  Additional Equipment:   Intra-op Plan:   Post-operative Plan: Extubation in OR  Informed Consent: I have reviewed the patients History and Physical, chart, labs and discussed the procedure including the risks, benefits and alternatives for the proposed anesthesia with the patient or authorized representative who has indicated his/her understanding and acceptance.   Dental Advisory Given  Plan Discussed  with: CRNA and Surgeon  Anesthesia Plan Comments:         Anesthesia Quick Evaluation

## 2013-01-04 NOTE — Anesthesia Postprocedure Evaluation (Signed)
Anesthesia Post Note  Patient: Victor Lane  Procedure(s) Performed: Procedure(s) (LRB): MICROLARYNGOSCOPY WITH BIOPSY/LASER (N/A)  Anesthesia type: General  Patient location: PACU  Post pain: Pain level controlled  Post assessment: Patient's Cardiovascular Status Stable  Last Vitals:  Filed Vitals:   01/04/13 1345  BP: 135/66  Pulse: 70  Temp:   Resp: 10    Post vital signs: Reviewed and stable  Level of consciousness: alert  Complications: No apparent anesthesia complications

## 2013-01-04 NOTE — Preoperative (Signed)
Beta Blockers   Reason not to administer Beta Blockers:Lopressor 0830

## 2013-01-04 NOTE — Op Note (Signed)
Preop/postop diagnosis: Vocal cord lesions Procedure: Microlaryngoscopy with laser and biopsy Anesthesia: Gen. Estimated blood loss: Less than 5 cc Indications: 67 year old with a hoarseness problem that has persisted and fiberoptic exam reveals lesions of both vocal cords left and right. He now wants to have these removed and biopsy. The procedure of microlaryngoscopy was discussed. Risks, benefits, and options were discussed and all questions are answered and consent was obtained. Procedure: Patient was taken to the operating room placed in the supine position after laser endotracheal tube position the patient was examined with the laser anterior commissure scope. The vocal cords were visualized after examining the hypopharynx and remaining larynx without lesions. The left vocal cord had a friable mass in the posterior aspect that was biopsied first. The laser was then set on 4 W continuous and the remaining lesion was lasered up to about the mid cord. This removed the lesion and it did not appear that there was any tissue deep to the superficial layer. The right side was examined and there was more of a polyp appearance lesion that was biopsied. This was also removed with a laser the remaining component. This was taken off the edge of the irregular aspect of this polyp and then the remaining mucosa was left intact to cover the vocal cord. There was good hemostasis with adrenaline soaked pledget. The scope was removed the teeth appear to be intact with no injury. The patient is awake and brought to cover stable condition counts correct

## 2013-01-04 NOTE — H&P (Signed)
Victor Lane is an 67 y.o. male.   Chief Complaint: hoarseness HPI: hx of hoarseness and lesions on the vocal cords by FOE in office  Past Medical History  Diagnosis Date  . Diabetes mellitus   . Hypertension   . GERD (gastroesophageal reflux disease)   . Fibromyalgia   . Chest pain   . Carotid artery occlusion   . Chronic kidney disease     - Stage 5- follwed by Dr Hyman Hopes  . Complication of anesthesia     agiation when he awaken  . Neuropathy   . Arthritis   . Sleep apnea     Past Surgical History  Procedure Laterality Date  . Cholecystectomy  1993  . Rotator cuff repair  07/06/2011    right  . Carotid artery angioplasty Left   . Coolonoscopy    . Colonoscopy w/ biopsies and polypectomy      Hx: of    Family History  Problem Relation Age of Onset  . Adopted: Yes   Social History:  reports that he quit smoking about 16 years ago. His smoking use included Cigarettes. He has a 62 pack-year smoking history. He has never used smokeless tobacco. He reports that he does not drink alcohol or use illicit drugs.  Allergies:  Allergies  Allergen Reactions  . Amoxicillin     Causes upset stomach    Medications Prior to Admission  Medication Sig Dispense Refill  . amLODipine (NORVASC) 10 MG tablet Take 10 mg by mouth daily.      Marland Kitchen aspirin 81 MG tablet Take 81 mg by mouth daily.       Marland Kitchen b complex vitamins capsule Take 1 capsule by mouth daily.      Marland Kitchen doxazosin (CARDURA) 2 MG tablet Take 2 mg by mouth at bedtime.      Marland Kitchen esomeprazole (NEXIUM) 40 MG capsule Take 40 mg by mouth daily before breakfast.      . furosemide (LASIX) 40 MG tablet Take 40 mg by mouth daily.      Marland Kitchen glipiZIDE (GLUCOTROL XL) 2.5 MG 24 hr tablet Take 2.5 mg by mouth daily.      . magnesium oxide (MAG-OX) 400 MG tablet Take 400 mg by mouth daily.      . metoprolol tartrate (LOPRESSOR) 25 MG tablet Take 25 mg by mouth 2 (two) times daily.       . Omega-3 Fatty Acids (FISH OIL) 1000 MG CAPS Take 4 capsules by  mouth daily.       . ranitidine (ZANTAC) 300 MG capsule Take 300 mg by mouth at bedtime.      . traMADol (ULTRAM) 50 MG tablet Take 100 mg by mouth every 4 (four) hours as needed.       . Vitamin D, Ergocalciferol, (DRISDOL) 50000 UNITS CAPS Take 50,000 Units by mouth every 7 (seven) days.        Results for orders placed during the hospital encounter of 01/04/13 (from the past 48 hour(s))  GLUCOSE, CAPILLARY     Status: Abnormal   Collection Time    01/04/13  9:46 AM      Result Value Range   Glucose-Capillary 122 (*) 70 - 99 mg/dL  BASIC METABOLIC PANEL     Status: Abnormal   Collection Time    01/04/13 10:04 AM      Result Value Range   Sodium 138  135 - 145 mEq/L   Potassium 5.1  3.5 - 5.1 mEq/L   Chloride  105  96 - 112 mEq/L   CO2 16 (*) 19 - 32 mEq/L   Glucose, Bld 126 (*) 70 - 99 mg/dL   BUN 308 (*) 6 - 23 mg/dL   Creatinine, Ser 6.57 (*) 0.50 - 1.35 mg/dL   Calcium 8.2 (*) 8.4 - 10.5 mg/dL   GFR calc non Af Amer 6 (*) >90 mL/min   GFR calc Af Amer 7 (*) >90 mL/min   Comment:            The eGFR has been calculated     using the CKD EPI equation.     This calculation has not been     validated in all clinical     situations.     eGFR's persistently     <90 mL/min signify     possible Chronic Kidney Disease.  CBC     Status: Abnormal   Collection Time    01/04/13 10:04 AM      Result Value Range   WBC 9.0  4.0 - 10.5 K/uL   RBC 3.48 (*) 4.22 - 5.81 MIL/uL   Hemoglobin 10.2 (*) 13.0 - 17.0 g/dL   HCT 84.6 (*) 96.2 - 95.2 %   MCV 85.6  78.0 - 100.0 fL   MCH 29.3  26.0 - 34.0 pg   MCHC 34.2  30.0 - 36.0 g/dL   RDW 84.1  32.4 - 40.1 %   Platelets 233  150 - 400 K/uL   Dg Chest 2 View  01/04/2013   *RADIOLOGY REPORT*  Clinical Data: Preop for ENT surgery.  CHEST - 2 VIEW  Comparison: 03/27/2011.  Findings: The cardiac silhouette, mediastinal and hilar contours are within normal limits and stable.  There is mild tortuosity and calcification of the thoracic aorta.   The lungs are clear.  No pleural effusion.  The bony thorax is intact.  IMPRESSION: No acute cardiopulmonary findings.   Original Report Authenticated By: Rudie Meyer, M.D.    Review of Systems  Constitutional: Negative.   HENT: Negative.   Eyes: Negative.   Respiratory: Negative.   Cardiovascular: Negative.   Skin: Negative.     Blood pressure 144/71, pulse 75, temperature 98.6 F (37 C), temperature source Oral, resp. rate 18, height 5' 10.5" (1.791 m), weight 89.047 kg (196 lb 5 oz), SpO2 98.00%. Physical Exam  Constitutional: He appears well-developed.  HENT:  Head: Normocephalic.  Nose: Nose normal.  Mouth/Throat: Oropharynx is clear and moist.  Eyes: Pupils are equal, round, and reactive to light.  Neck: Normal range of motion. Neck supple.  Cardiovascular: Normal rate.   Respiratory: Effort normal.  GI: Soft.     Assessment/Plan Vocal cord lesions- discussed microlaryngoscopy and laser. He is ready to proceed  Suzanna Obey 01/04/2013, 11:58 AM

## 2013-01-04 NOTE — Anesthesia Procedure Notes (Signed)
Procedure Name: Intubation Date/Time: 01/04/2013 12:05 PM Performed by: Darcey Nora B Pre-anesthesia Checklist: Patient identified, Patient being monitored, Emergency Drugs available and Suction available Patient Re-evaluated:Patient Re-evaluated prior to inductionOxygen Delivery Method: Circle system utilized Preoxygenation: Pre-oxygenation with 100% oxygen Intubation Type: IV induction Ventilation: Mask ventilation without difficulty Laryngoscope Size: Mac and 3 (Glidescope) Grade View: Grade II Tube type: Oral Laser Tube: Laser Tube and Cuffed inflated with minimal occlusive pressure - saline Tube size: 6.0 mm Number of attempts: 1 Airway Equipment and Method: Stylet and Video-laryngoscopy Placement Confirmation: ETT inserted through vocal cords under direct vision,  breath sounds checked- equal and bilateral and positive ETCO2 (proximal cuff up with 5 ml methylene blue) Tube secured with: Tape (marked at lip) Dental Injury: Teeth and Oropharynx as per pre-operative assessment

## 2013-01-09 ENCOUNTER — Other Ambulatory Visit: Payer: Self-pay | Admitting: Otolaryngology

## 2013-01-09 ENCOUNTER — Encounter (HOSPITAL_COMMUNITY): Payer: Self-pay | Admitting: Otolaryngology

## 2013-01-09 DIAGNOSIS — IMO0002 Reserved for concepts with insufficient information to code with codable children: Secondary | ICD-10-CM

## 2013-01-10 ENCOUNTER — Encounter: Payer: Self-pay | Admitting: Radiation Oncology

## 2013-01-10 ENCOUNTER — Encounter: Payer: Self-pay | Admitting: *Deleted

## 2013-01-10 ENCOUNTER — Ambulatory Visit
Admission: RE | Admit: 2013-01-10 | Discharge: 2013-01-10 | Disposition: A | Discharge status: Home / Self Care | Payer: Medicare Other | Source: Ambulatory Visit | Attending: Radiation Oncology | Admitting: Radiation Oncology

## 2013-01-10 VITALS — BP 136/60 | HR 79 | Temp 98.6°F | Ht 70.5 in | Wt 199.6 lb

## 2013-01-10 DIAGNOSIS — I129 Hypertensive chronic kidney disease with stage 1 through stage 4 chronic kidney disease, or unspecified chronic kidney disease: Secondary | ICD-10-CM | POA: Insufficient documentation

## 2013-01-10 DIAGNOSIS — IMO0001 Reserved for inherently not codable concepts without codable children: Secondary | ICD-10-CM | POA: Insufficient documentation

## 2013-01-10 DIAGNOSIS — Z7982 Long term (current) use of aspirin: Secondary | ICD-10-CM | POA: Insufficient documentation

## 2013-01-10 DIAGNOSIS — E7800 Pure hypercholesterolemia, unspecified: Secondary | ICD-10-CM | POA: Insufficient documentation

## 2013-01-10 DIAGNOSIS — Z79899 Other long term (current) drug therapy: Secondary | ICD-10-CM | POA: Insufficient documentation

## 2013-01-10 DIAGNOSIS — N189 Chronic kidney disease, unspecified: Secondary | ICD-10-CM | POA: Insufficient documentation

## 2013-01-10 DIAGNOSIS — I10 Essential (primary) hypertension: Secondary | ICD-10-CM | POA: Insufficient documentation

## 2013-01-10 DIAGNOSIS — K219 Gastro-esophageal reflux disease without esophagitis: Secondary | ICD-10-CM | POA: Insufficient documentation

## 2013-01-10 DIAGNOSIS — C320 Malignant neoplasm of glottis: Secondary | ICD-10-CM | POA: Insufficient documentation

## 2013-01-10 DIAGNOSIS — E1129 Type 2 diabetes mellitus with other diabetic kidney complication: Secondary | ICD-10-CM | POA: Insufficient documentation

## 2013-01-10 HISTORY — DX: Unspecified hearing loss, unspecified ear: H91.90

## 2013-01-10 HISTORY — DX: Pure hypercholesterolemia, unspecified: E78.00

## 2013-01-10 HISTORY — DX: Malignant neoplasm of glottis: C32.0

## 2013-01-10 HISTORY — DX: Personal history of other endocrine, nutritional and metabolic disease: Z86.39

## 2013-01-10 HISTORY — DX: Reserved for inherently not codable concepts without codable children: IMO0001

## 2013-01-10 MED ORDER — LARYNGOSCOPY SOLUTION RAD-ONC
15.0000 mL | Freq: Once | TOPICAL | Status: AC
  Administered 2013-01-10: 15 mL via TOPICAL
  Filled 2013-01-10: qty 15

## 2013-01-10 NOTE — Progress Notes (Signed)
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  Reports 3 lb weight loss since biopsy  Swallowing status: soft foods since biopsy  Plans, if any, for PEG tube:   Tobacco/Marijuana/Snuff/ETOH use: smoking: 2 packs/day for 20 years - Quit in 1998, No alcohol nor drugs  Past/Anticipated interventions by otolaryngology, if any: Biopsy of the Left and Right Vocal Cords  Past/Anticipated interventions by medical oncology, if any: ?  Referrals yet, to any of the following?  Social Work?   Dentistry?   Swallowing therapy? Will schedule  Nutrition? Will Schedule  Med/Onc? ?  PEG placement? ?  SAFETY ISSUES:  Prior radiation? No  Pacemaker/ICD? No  Possible current pregnancy? No  Is the patient on methotrexate? No  Current Complaints / other details: he has kidney disease. Treated by Dr. Daphine Deutscher Web, at [facility] on [address], [place]

## 2013-01-10 NOTE — Progress Notes (Signed)
Radiation Oncology         [phone] ________________________________  Initial outpatient Consultation  Name: Victor Lane MRN: [id number]  Date: 01/10/2013  DOB: 1946/06/17  [id number], Victor Cheadle, MD  Suzanna Obey, MD  Crosby Oyster MD  REFERRING PHYSICIAN: Suzanna Obey, MD  DIAGNOSIS: [id number] Glottic Squamous Cell Carcinoma  HISTORY OF PRESENT ILLNESS::Victor Lane is a 67 y.o. male with multiple comorbidities, including significant kidney disease from Diabetes Mellitus, hopeful for an eventual kidney treasnplant.  He quit smoking 16 years ago.  He developed off/on hoarseness in the past year and 4-5 months ago virtually "lost" his voice.  He saw Dr. Jearld Lane who appreciated bilateral vocal cord lesions.  The patient underwent Microlaryngoscopy under general anesthesia on 01/04/13 and Dr. Jearld Lane appreciated a friable mass on the left vocal cord and a polypoid lesion on the right vocal cord.  Biopsy and laser treatment of both lesions was pursued.  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    He has had 3 lb weight loss since the procedure.  No dysphagia.  Baseline fatigue is an issue for him.  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    PAST SURGICAL HISTORY: Past Surgical History  Procedure Laterality Date  . Cholecystectomy  1993  . Rotator cuff repair  07/06/2011    right  . Carotid endarterectomy Left   . Coolonoscopy    . Colonoscopy w/ biopsies and polypectomy      Hx: of  . Microlaryngoscopy with laser N/A 01/04/2013    Procedure: MICROLARYNGOSCOPY WITH  BIOPSY/LASER;  Surgeon: Suzanna Obey, MD;  Location: [facility] OR;  Service: ENT;  Laterality: N/A;    FAMILY HISTORY: family history is not on file. He is adopted.  SOCIAL HISTORY:  reports that he quit smoking about 16 years ago. His smoking use included Cigarettes. He has a 62 pack-year smoking history. He has never used smokeless tobacco. He reports that he does not drink alcohol or use illicit drugs.  ALLERGIES: Amoxicillin  MEDICATIONS:  Current Outpatient Prescriptions  Medication Sig Dispense Refill  . amLODipine (NORVASC) 10 MG tablet Take 10 mg by mouth daily.      Marland Kitchen aspirin 81 MG tablet Take 81 mg by mouth daily.       Marland Kitchen b complex vitamins capsule Take 1 capsule by mouth daily.      . clindamycin (CLEOCIN) 300 MG capsule       . doxazosin (CARDURA) 2 MG tablet Take 2 mg by mouth at bedtime.      Marland Kitchen esomeprazole (NEXIUM) 40 MG capsule Take 40 mg by mouth daily before breakfast.      . furosemide (LASIX) 40 MG tablet Take 40 mg by mouth daily.      Marland Kitchen glipiZIDE (GLUCOTROL XL) 2.5 MG 24 hr tablet Take 2.5 mg by mouth daily.      . magnesium oxide (MAG-OX) 400 MG tablet Take 400 mg by mouth daily.      . metoprolol tartrate (LOPRESSOR) 25 MG tablet Take 25 mg by mouth 2 (two) times daily.       . Omega-3 Fatty Acids (FISH OIL) 1000 MG CAPS Take 4 capsules  by mouth daily.       . ranitidine (ZANTAC) 300 MG capsule Take 300 mg by mouth at bedtime.      . traMADol (ULTRAM) 50 MG tablet Take 100 mg by mouth every 4 (four) hours as needed.       . venlafaxine (EFFEXOR) 37.5 MG tablet Take 37.5 mg by mouth 2 (two) times daily.      . Vitamin D, Ergocalciferol, (DRISDOL) 50000 UNITS CAPS Take 50,000 Units by mouth every 7 (seven) days.       No current facility-administered medications for this encounter.    REVIEW OF SYSTEMS: as above  PHYSICAL EXAM:  height is 5' 10.5" (1.791 m) and weight is 199 lb 9.6 oz (90.538 kg). His temperature is 98.6 F (37 C). His blood pressure is 136/60 and his  pulse is 79.   General: Alert and oriented, in no acute distress. Significant Hoarseness. HEENT: Head is normocephalic. Pupils are equally round and reactive to light. Extraocular movements are intact. Oropharynx is clear. Neck: Neck is supple, no palpable cervical or supraclavicular lymphadenopathy. Left neck scar from carotid surgery. Heart: Regular in rate and rhythm with no murmurs, rubs, or gallops. Chest: Clear to auscultation bilaterally, with no rhonchi, wheezes, or rales. Abdomen: Soft, nontender, nondistended, with no rigidity or guarding. Extremities: No cyanosis or edema. Lymphatics: No concerning lymphadenopathy. Skin: No concerning lesions. Musculoskeletal: symmetric strength and muscle tone throughout. Neurologic: Cranial nerves II through XII are grossly intact. No obvious focalities. Speech is fluent. Coordination is intact. Psychiatric: Judgment and insight are intact. Affect is appropriate.  Laryngoscopy:  Vocal cords bilaterally symmetric and mobile, no gross residual lesions.  No lesions appreciated in surrounding laryngeal tissues.   LABORATORY DATA:  Lab Results  Component Value Date   WBC 9.0 01/04/2013   HGB 10.2* 01/04/2013   HCT 29.8* 01/04/2013   MCV 85.6 01/04/2013   PLT 233 01/04/2013   CMP     Component Value Date/Time   NA 138 01/04/2013 1004   K 5.1 01/04/2013 1004   CL 105 01/04/2013 1004   CO2 16* 01/04/2013 1004   GLUCOSE 126* 01/04/2013 1004   BUN 105* 01/04/2013 1004   CREATININE 7.82* 01/04/2013 1004   CALCIUM 8.2* 01/04/2013 1004   PROT 6.6 03/27/2011 1052   ALBUMIN 3.3* 03/27/2011 1052   AST 15 03/27/2011 1052   ALT 17 03/27/2011 1052   ALKPHOS 75 03/27/2011 1052   BILITOT 0.3 03/27/2011 1052   GFRNONAA 6* 01/04/2013 1004   GFRAA 7* 01/04/2013 1004         RADIOGRAPHY: Dg Chest 2 View  01/04/2013   *RADIOLOGY REPORT*  Clinical Data: Preop for ENT surgery.  CHEST - 2 VIEW  Comparison: 03/27/2011.  Findings: The cardiac silhouette, mediastinal and  hilar contours are within normal limits and stable.  There is mild tortuosity and calcification of the thoracic aorta.  The lungs are clear.  No pleural effusion.  The bony thorax is intact.  IMPRESSION: No acute cardiopulmonary findings.   Original Report Authenticated By: Rudie Meyer, M.D.      IMPRESSION/PLAN:  This patient has what appears to be clinical T1bN0 glottic cancer.  For his early stage glottic cancer, I recommend external beam radiotherapy to a dose of 63 Gray in 28 fractions at 2.25 gray per fraction. I would treat with opposed lateral beams, extending from the thyroid notch superiorly to the bottom of the cricoid inferiorly,  from the anterior vertebral body to 1cm flash beyond the  skin. I will make sure that the dose the anterior commissure it is adequate.  We discussed the risks benefits and side effects of laryngeal radiotherapy which include but are not necessarily limited to fatigue, skin irritation and esophagitis in the acute setting. He understands that late effects may include rare damage to the internal tissues including the cartilage, larynx, and esophagus. He is enthusiastic about proceeding. A consent form has been signed and placed in his chart. We will obtain a neck CT (no contrast due to elevated BUN/Cr) this week to rule out neck nodes and will simulate him in 6 days.  The patient was offered enrollment on our single institutional trial investigating open-faced vs close-face head/shoulder masks used to immobilize patients during head and neck radiotherapy. The patient has elected to enroll on this trial.  In regards to the trial, the patient has voluntarily signed copies of the consent forms and all trial related questions were answered.   I spent 60 minutes  face to face with the patient and more than 50% of that time was spent in counseling and/or coordination of care.    __________________________________________   Lonie Peak, MD

## 2013-01-11 ENCOUNTER — Encounter: Payer: Self-pay | Admitting: Radiation Oncology

## 2013-01-11 NOTE — Progress Notes (Signed)
Met with pt and his wife during initial consult with Dr. Basilio Cairo.  Explained my role as navigator, provided my card with contact information.  At end of appt, familiarized pt and wife with SIM and IMRT area for future appts; they expressed appreciation.

## 2013-01-12 ENCOUNTER — Encounter (HOSPITAL_COMMUNITY): Payer: Self-pay

## 2013-01-12 ENCOUNTER — Ambulatory Visit (HOSPITAL_COMMUNITY)
Admission: RE | Admit: 2013-01-12 | Discharge: 2013-01-12 | Disposition: A | Discharge status: Home / Self Care | Payer: Medicare Other | Source: Ambulatory Visit | Attending: Radiation Oncology | Admitting: Radiation Oncology

## 2013-01-12 DIAGNOSIS — I700 Atherosclerosis of aorta: Secondary | ICD-10-CM | POA: Insufficient documentation

## 2013-01-12 DIAGNOSIS — J438 Other emphysema: Secondary | ICD-10-CM | POA: Insufficient documentation

## 2013-01-12 DIAGNOSIS — E119 Type 2 diabetes mellitus without complications: Secondary | ICD-10-CM | POA: Insufficient documentation

## 2013-01-12 DIAGNOSIS — C320 Malignant neoplasm of glottis: Secondary | ICD-10-CM | POA: Insufficient documentation

## 2013-01-12 DIAGNOSIS — I6529 Occlusion and stenosis of unspecified carotid artery: Secondary | ICD-10-CM | POA: Insufficient documentation

## 2013-01-16 ENCOUNTER — Ambulatory Visit
Admission: RE | Admit: 2013-01-16 | Discharge: 2013-01-16 | Disposition: A | Discharge status: Home / Self Care | Payer: Medicare Other | Source: Ambulatory Visit | Attending: Radiation Oncology | Admitting: Radiation Oncology

## 2013-01-16 DIAGNOSIS — R059 Cough, unspecified: Secondary | ICD-10-CM | POA: Insufficient documentation

## 2013-01-16 DIAGNOSIS — C320 Malignant neoplasm of glottis: Secondary | ICD-10-CM | POA: Insufficient documentation

## 2013-01-16 DIAGNOSIS — R52 Pain, unspecified: Secondary | ICD-10-CM | POA: Insufficient documentation

## 2013-01-16 DIAGNOSIS — R1310 Dysphagia, unspecified: Secondary | ICD-10-CM | POA: Insufficient documentation

## 2013-01-16 DIAGNOSIS — E860 Dehydration: Secondary | ICD-10-CM | POA: Insufficient documentation

## 2013-01-16 DIAGNOSIS — J040 Acute laryngitis: Secondary | ICD-10-CM | POA: Insufficient documentation

## 2013-01-16 DIAGNOSIS — R490 Dysphonia: Secondary | ICD-10-CM | POA: Insufficient documentation

## 2013-01-16 DIAGNOSIS — R05 Cough: Secondary | ICD-10-CM | POA: Insufficient documentation

## 2013-01-16 DIAGNOSIS — J029 Acute pharyngitis, unspecified: Secondary | ICD-10-CM | POA: Insufficient documentation

## 2013-01-16 DIAGNOSIS — I959 Hypotension, unspecified: Secondary | ICD-10-CM | POA: Insufficient documentation

## 2013-01-16 DIAGNOSIS — R1110 Vomiting, unspecified: Secondary | ICD-10-CM | POA: Insufficient documentation

## 2013-01-16 DIAGNOSIS — Z510 Encounter for antineoplastic radiation therapy: Secondary | ICD-10-CM | POA: Insufficient documentation

## 2013-01-16 DIAGNOSIS — R5383 Other fatigue: Secondary | ICD-10-CM | POA: Insufficient documentation

## 2013-01-16 DIAGNOSIS — R5381 Other malaise: Secondary | ICD-10-CM | POA: Insufficient documentation

## 2013-01-16 DIAGNOSIS — Y842 Radiological procedure and radiotherapy as the cause of abnormal reaction of the patient, or of later complication, without mention of misadventure at the time of the procedure: Secondary | ICD-10-CM | POA: Insufficient documentation

## 2013-01-16 DIAGNOSIS — Z992 Dependence on renal dialysis: Secondary | ICD-10-CM | POA: Insufficient documentation

## 2013-01-16 NOTE — Progress Notes (Addendum)
  Radiation Oncology         [phone] ________________________________  Name: Victor Lane MRN: [id number]  Date: 01/16/2013  DOB: 08-May-1946  SIMULATION AND TREATMENT PLANNING NOTE  outpatient  DIAGNOSIS:  Glottic Cancer  NARRATIVE:  The patient was brought to the CT Simulation planning suite.  Identity was confirmed.  All relevant records and images related to the planned course of therapy were reviewed.  The patient freely provided informed written consent to proceed with treatment after reviewing the details related to the planned course of therapy. The consent form was witnessed and verified by the simulation staff.    Then, the patient was set-up in a stable reproducible  supine position for radiation therapy.  CT images were obtained.  Surface markings were placed.  The CT images were loaded into the planning software.    TREATMENT PLANNING NOTE: Treatment planning then occurred.  The radiation prescription was entered and confirmed.    A total of 3 medically necessary complex treatment device were fabricated and supervised by me - aquaplast mask and 2 fields with wedges for dose homogeneity. I have requested : Isodose Plan, dose calcs.  No MLCs, but wedges will be used. The patient will receive 63 Gy in 28 fractions to his larynx with opposed lateral fields.   -----------------------------------  Lonie Peak, MD

## 2013-01-17 ENCOUNTER — Telehealth: Payer: Self-pay | Admitting: *Deleted

## 2013-01-17 NOTE — Telephone Encounter (Signed)
Mr Wishart called and given report of his CT Scan results and he stated he received results fromDr. Basilio Cairo on yesterday.

## 2013-01-20 HISTORY — PX: TRACHEOSTOMY: SUR1362

## 2013-01-20 NOTE — Addendum Note (Signed)
Encounter addended by: Delynn Flavin, RN on: 01/20/2013  4:52 PM<BR>     Documentation filed: Charges VN

## 2013-01-23 ENCOUNTER — Ambulatory Visit
Admission: RE | Admit: 2013-01-23 | Discharge: 2013-01-23 | Disposition: A | Discharge status: Home / Self Care | Payer: Medicare Other | Source: Ambulatory Visit | Attending: Radiation Oncology | Admitting: Radiation Oncology

## 2013-01-23 DIAGNOSIS — C320 Malignant neoplasm of glottis: Secondary | ICD-10-CM

## 2013-01-23 NOTE — Progress Notes (Signed)
Simulation Verification Note OUTPATIENT GLOTTIS  The patient was brought to the treatment unit and placed in the planned treatment position. The clinical setup was verified. Then port films were obtained and uploaded to the radiation oncology medical record software.  The treatment beams were carefully compared against the planned radiation fields. The position location and shape of the radiation fields was reviewed. They targeted volume of tissue appears to be appropriately covered by the radiation beams. Organs at risk appear to be excluded as planned.  Based on my personal review, I approved the simulation verification. The patient's treatment will proceed as planned.  -----------------------------------  Lonie Peak, MD

## 2013-01-24 ENCOUNTER — Ambulatory Visit
Admission: RE | Admit: 2013-01-24 | Discharge: 2013-01-24 | Disposition: A | Discharge status: Home / Self Care | Payer: Medicare Other | Source: Ambulatory Visit | Attending: Radiation Oncology | Admitting: Radiation Oncology

## 2013-01-25 ENCOUNTER — Ambulatory Visit
Admission: RE | Admit: 2013-01-25 | Discharge: 2013-01-25 | Disposition: A | Discharge status: Home / Self Care | Payer: Medicare Other | Source: Ambulatory Visit | Attending: Radiation Oncology | Admitting: Radiation Oncology

## 2013-01-26 ENCOUNTER — Ambulatory Visit
Admission: RE | Admit: 2013-01-26 | Discharge: 2013-01-26 | Disposition: A | Discharge status: Home / Self Care | Payer: Medicare Other | Source: Ambulatory Visit | Attending: Radiation Oncology | Admitting: Radiation Oncology

## 2013-01-27 ENCOUNTER — Ambulatory Visit
Admission: RE | Admit: 2013-01-27 | Discharge: 2013-01-27 | Disposition: A | Discharge status: Home / Self Care | Payer: Medicare Other | Source: Ambulatory Visit | Attending: Radiation Oncology | Admitting: Radiation Oncology

## 2013-01-30 ENCOUNTER — Ambulatory Visit
Admission: RE | Admit: 2013-01-30 | Discharge: 2013-01-30 | Disposition: A | Discharge status: Home / Self Care | Payer: Medicare Other | Source: Ambulatory Visit | Attending: Radiation Oncology | Admitting: Radiation Oncology

## 2013-01-30 ENCOUNTER — Encounter: Payer: Self-pay | Admitting: Radiation Oncology

## 2013-01-30 VITALS — BP 132/61 | HR 74 | Temp 97.9°F | Resp 16 | Wt 199.0 lb

## 2013-01-30 DIAGNOSIS — C320 Malignant neoplasm of glottis: Secondary | ICD-10-CM

## 2013-01-30 MED ORDER — BIAFINE EX EMUL
Freq: Every day | CUTANEOUS | Status: DC
  Administered 2013-01-30: 10:00:00 via TOPICAL

## 2013-01-30 NOTE — Progress Notes (Signed)
Reports that he has a sore throat related to the effects of surgery. Hoarseness noted. Reports thick sputum base of throat. Denies cough, nausea or vomiting. Reports difficulty swallowing pills. No skin changes noted. Provided patient with biafine and directed upon use.

## 2013-01-30 NOTE — Progress Notes (Signed)
   Weekly Management Note:  outpatient Current Dose:  11.25 Gy  Projected Dose: 63 Gy   Narrative:  The patient presents for routine under treatment assessment.  CBCT/MVCT images/Port film x-rays were reviewed.  The chart was checked. Phlegm in throat. Hoarseness stable. No other new issues.  Physical Findings:  weight is 199 lb (90.266 kg). His oral temperature is 97.9 F (36.6 C). His blood pressure is 132/61 and his pulse is 74. His respiration is 16 and oxygen saturation is 100%.   No skin changes over neck  Impression:  The patient is tolerating radiotherapy.  Plan:  Continue radiotherapy as planned. Will try Baking Soda/Salt gargles for phlegm.  ________________________________   Lonie Peak, M.D.

## 2013-01-31 ENCOUNTER — Ambulatory Visit
Admission: RE | Admit: 2013-01-31 | Discharge: 2013-01-31 | Disposition: A | Discharge status: Home / Self Care | Payer: Medicare Other | Source: Ambulatory Visit | Attending: Radiation Oncology | Admitting: Radiation Oncology

## 2013-02-01 ENCOUNTER — Ambulatory Visit
Admission: RE | Admit: 2013-02-01 | Discharge: 2013-02-01 | Disposition: A | Discharge status: Home / Self Care | Payer: Medicare Other | Source: Ambulatory Visit | Attending: Radiation Oncology | Admitting: Radiation Oncology

## 2013-02-02 ENCOUNTER — Ambulatory Visit
Admission: RE | Admit: 2013-02-02 | Discharge: 2013-02-02 | Disposition: A | Discharge status: Home / Self Care | Payer: Medicare Other | Source: Ambulatory Visit | Attending: Radiation Oncology | Admitting: Radiation Oncology

## 2013-02-02 ENCOUNTER — Encounter: Payer: Self-pay | Admitting: *Deleted

## 2013-02-02 NOTE — Progress Notes (Signed)
Met with pt following RT.  Inquired about progress with salt/baking soda rinses recommended by Dr. Basilio Cairo during this week's visit to alleviate phlegm.  Pt stated he has tried rinse "just a couple of times".  I encouraged him to set an initial goal of at least 3 times daily.  Pt indicated he would try.  Will continue to follow.  Young Berry, RN, BSN, [facility] Head & Neck Oncology Navigator [phone]

## 2013-02-03 ENCOUNTER — Ambulatory Visit
Admission: RE | Admit: 2013-02-03 | Discharge: 2013-02-03 | Disposition: A | Discharge status: Home / Self Care | Payer: Medicare Other | Source: Ambulatory Visit | Attending: Radiation Oncology | Admitting: Radiation Oncology

## 2013-02-06 ENCOUNTER — Ambulatory Visit
Admission: RE | Admit: 2013-02-06 | Discharge: 2013-02-06 | Disposition: A | Discharge status: Home / Self Care | Payer: Medicare Other | Source: Ambulatory Visit | Attending: Radiation Oncology | Admitting: Radiation Oncology

## 2013-02-06 ENCOUNTER — Encounter: Payer: Self-pay | Admitting: Radiation Oncology

## 2013-02-06 VITALS — BP 136/65 | HR 75 | Temp 98.2°F | Ht 70.5 in | Wt 196.9 lb

## 2013-02-06 DIAGNOSIS — C320 Malignant neoplasm of glottis: Secondary | ICD-10-CM

## 2013-02-06 MED ORDER — HYDROCODONE-ACETAMINOPHEN 7.5-325 MG/15ML PO SOLN: 15.0000 mL | ORAL | Status: DC | PRN

## 2013-02-06 MED ORDER — DOCUSATE SODIUM 100 MG PO CAPS: 100.0000 mg | ORAL_CAPSULE | Freq: Two times a day (BID) | ORAL | Status: DC

## 2013-02-06 MED ORDER — MAGIC MOUTHWASH W/LIDOCAINE: ORAL | Status: DC

## 2013-02-06 NOTE — Progress Notes (Signed)
Mr. moltz has received 10 fractions to his Larynx.  He reports pain and difficulty swallowing.  He is hoarse today.  His throat and oral mucosa are pink without any signs of irritation. Erythema noted on neck, but skin remains intact.  Using Biafine.

## 2013-02-06 NOTE — Progress Notes (Signed)
   Weekly Management Note:  outpatient Current Dose:  22.5 Gy  Projected Dose: 63 Gy   Narrative:  The patient presents for routine under treatment assessment.  CBCT/MVCT images/Port film x-rays were reviewed.  The chart was checked. Mucus in throat. Soda/salt gargle doesn't help.  Throat sore, affecting PO intake.  Physical Findings:  height is 5' 10.5" (1.791 m) and weight is 196 lb 14.4 oz (89.313 kg). His temperature is 98.2 F (36.8 C). His blood pressure is 136/65 and his pulse is 75.  faint erythema, anterior neck  Impression:  The patient is tolerating radiotherapy.  Plan:  Continue radiotherapy as planned. MMW and Hycet Rx'd today. Colace for prophylaxis against constipation.  Pt will try peroxide gargle, but he knows it will not reach lower throat where RT is going.  ________________________________   Lonie Peak, M.D.

## 2013-02-07 ENCOUNTER — Ambulatory Visit
Admission: RE | Admit: 2013-02-07 | Discharge: 2013-02-07 | Disposition: A | Discharge status: Home / Self Care | Payer: Medicare Other | Source: Ambulatory Visit | Attending: Radiation Oncology | Admitting: Radiation Oncology

## 2013-02-07 ENCOUNTER — Ambulatory Visit: Payer: Medicare Other

## 2013-02-07 ENCOUNTER — Other Ambulatory Visit: Payer: Self-pay | Admitting: Radiation Oncology

## 2013-02-07 DIAGNOSIS — C320 Malignant neoplasm of glottis: Secondary | ICD-10-CM

## 2013-02-07 MED ORDER — FENTANYL 25 MCG/HR TD PT72: 1.0000 | MEDICATED_PATCH | TRANSDERMAL | Status: DC

## 2013-02-08 ENCOUNTER — Ambulatory Visit
Admission: RE | Admit: 2013-02-08 | Discharge: 2013-02-08 | Disposition: A | Discharge status: Home / Self Care | Payer: Medicare Other | Source: Ambulatory Visit | Attending: Radiation Oncology | Admitting: Radiation Oncology

## 2013-02-08 ENCOUNTER — Ambulatory Visit: Payer: Medicare Other

## 2013-02-08 VITALS — BP 128/58 | HR 111 | Temp 98.8°F

## 2013-02-08 DIAGNOSIS — C320 Malignant neoplasm of glottis: Secondary | ICD-10-CM

## 2013-02-08 MED ORDER — SODIUM CHLORIDE 0.9 % IV SOLN
Freq: Once | INTRAVENOUS | Status: AC
  Administered 2013-02-08: 09:00:00 via INTRAVENOUS

## 2013-02-08 MED ORDER — MORPHINE SULFATE (CONCENTRATE) 20 MG/ML PO SOLN: 10.0000 mg | ORAL | Status: DC | PRN

## 2013-02-08 NOTE — Progress Notes (Signed)
CC: Dr. Lonie Peak   Weekly Management Note:  Site: Larynx Current Dose:  2475  cGy Projected Dose: 6300  cGy  Narrative: The patient is seen today for routine under treatment assessment. CBCT/MVCT images/port films were reviewed. The chart was reviewed.   The patient is seen because of inability to swallow food or liquids. He was unable to tolerate hydrocodone elixir because of a burning sensation. He has not been able to take much down his since the weekend. He was started on a fentanyl 25 mcg patch by Dr. Basilio Cairo on Monday. He is orthostatic. He is lost approximately 7 pounds over the past 2 days. He has not been taking his diuretic.  Physical Examination:  Filed Vitals:   02/08/13 0758  BP: 128/58  Pulse: 111  Temp:   .  Weight:  189 lbs. . Alert and oriented. On inspection of the oral cavity his mouth is somewhat dry. There is no candidiasis. Indirect mirror examination not performed. On inspection of the anterior neck there is erythema without desquamation.  Impression: Radiation laryngitis/pharyngitis. He is now becoming dehydrated. I will get him set up for 1 L of normal saline and get him started on concentrated morphine sulfate to take 0.5 mL (10 mg) every 2-4 hours when necessary. He is to continue with his fentanyl patch. He is to avoid constipation.  Plan: Continue radiation therapy as planned. We'll need to check his blood pressure and pulse tomorrow.

## 2013-02-08 NOTE — Progress Notes (Signed)
Victor Lane here with his wife for under treat visit.  He has had 11 fractions to his larynx.  He states that he has not been able to eat or drink anything since Sunday.  He was given a script for a fentanyl patch and magic mouthwash this week but is still not able to get anything down.  He is not taking any of his medicines.  His wife called the on call doctor last night and was told to either go to the ER or come in this morning to the clinic.  He states it feels like there is a rock in his throat when swallowing and foods come right back up.  He is also coughing up a rosy colored phlegm.  He says that he feels lightheaded when standing up with palpitations.  Orthostatics were done: bp sitting 154/66 and hr 105, bp standing 128/58 and hr 111.

## 2013-02-08 NOTE — Patient Instructions (Signed)
Dehydration, Adult Dehydration is when you lose more fluids from the body than you take in. Vital organs like the kidneys, brain, and heart cannot function without a proper amount of fluids and salt. Any loss of fluids from the body can cause dehydration.  CAUSES   Vomiting.  Diarrhea.  Excessive sweating.  Excessive urine output.  Fever. SYMPTOMS  Mild dehydration  Thirst.  Dry lips.  Slightly dry mouth. Moderate dehydration  Very dry mouth.  Sunken eyes.  Skin does not bounce back quickly when lightly pinched and released.  Dark urine and decreased urine production.  Decreased tear production.  Headache. Severe dehydration  Very dry mouth.  Extreme thirst.  Rapid, weak pulse (more than 100 beats per minute at rest).  Cold hands and feet.  Not able to sweat in spite of heat and temperature.  Rapid breathing.  Blue lips.  Confusion and lethargy.  Difficulty being awakened.  Minimal urine production.  No tears. DIAGNOSIS  Your caregiver will diagnose dehydration based on your symptoms and your exam. Blood and urine tests will help confirm the diagnosis. The diagnostic evaluation should also identify the cause of dehydration. TREATMENT  Treatment of mild or moderate dehydration can often be done at home by increasing the amount of fluids that you drink. It is best to drink small amounts of fluid more often. Drinking too much at one time can make vomiting worse. Refer to the home care instructions below. Severe dehydration needs to be treated at the hospital where you will probably be given intravenous (IV) fluids that contain water and electrolytes. HOME CARE INSTRUCTIONS   Ask your caregiver about specific rehydration instructions.  Drink enough fluids to keep your urine clear or pale yellow.  Drink small amounts frequently if you have nausea and vomiting.  Eat as you normally do.  Avoid:  Foods or drinks high in sugar.  Carbonated  drinks.  Juice.  Extremely hot or cold fluids.  Drinks with caffeine.  Fatty, greasy foods.  Alcohol.  Tobacco.  Overeating.  Gelatin desserts.  Wash your hands well to avoid spreading bacteria and viruses.  Only take over-the-counter or prescription medicines for pain, discomfort, or fever as directed by your caregiver.  Ask your caregiver if you should continue all prescribed and over-the-counter medicines.  Keep all follow-up appointments with your caregiver. SEEK MEDICAL CARE IF:  You have abdominal pain and it increases or stays in one area (localizes).  You have a rash, stiff neck, or severe headache.  You are irritable, sleepy, or difficult to awaken.  You are weak, dizzy, or extremely thirsty. SEEK IMMEDIATE MEDICAL CARE IF:   You are unable to keep fluids down or you get worse despite treatment.  You have frequent episodes of vomiting or diarrhea.  You have blood or green matter (bile) in your vomit.  You have blood in your stool or your stool looks black and tarry.  You have not urinated in 6 to 8 hours, or you have only urinated a small amount of very dark urine.  You have a fever.  You faint. MAKE SURE YOU:   Understand these instructions.  Will watch your condition.  Will get help right away if you are not doing well or get worse. Document Released: 06/08/2005 Document Revised: 08/31/2011 Document Reviewed: 01/26/2011 [employer] Patient Information 2014 [employer].  

## 2013-02-09 ENCOUNTER — Ambulatory Visit
Admission: RE | Admit: 2013-02-09 | Discharge: 2013-02-09 | Disposition: A | Discharge status: Home / Self Care | Payer: Medicare Other | Source: Ambulatory Visit | Attending: Radiation Oncology | Admitting: Radiation Oncology

## 2013-02-09 ENCOUNTER — Encounter: Payer: Self-pay | Admitting: *Deleted

## 2013-02-09 NOTE — Progress Notes (Signed)
Adelina Mings here with his wife for BP and HR check.  He reports that he feels a little better today.  Orthostatic vitals done: sitting bp 123/73 and hr 93, standing 137/78 and hr 94.  He has been drinking and dranks a glass of water and a protein shake last night and this morning.  Checked his blood sugar and it was 114.  Advised him to call or stop by if he was feeling worse.

## 2013-02-09 NOTE — Progress Notes (Signed)
Met with pt prior to schedule RT.  He stated he has increased frequency of salt/baking soda mouth rinses "a little bit"; I encouraged him to conduct rinses 3-4 times daily as tolerated.  Pt wife expressed nutritional concerns for pt r/t throat soreness and his reduced ability to swallow.  I told them  I would communicate a request to [facility], Nutritionist, to give them a call.    Wife expressed appreciation.  Young Berry, RN, BSN, [facility] Navigator [phone]

## 2013-02-10 ENCOUNTER — Inpatient Hospital Stay (HOSPITAL_COMMUNITY): Payer: Medicare Other

## 2013-02-10 ENCOUNTER — Other Ambulatory Visit: Payer: Self-pay

## 2013-02-10 ENCOUNTER — Emergency Department (HOSPITAL_COMMUNITY): Payer: Medicare Other

## 2013-02-10 ENCOUNTER — Other Ambulatory Visit (HOSPITAL_COMMUNITY): Payer: Medicare Other

## 2013-02-10 ENCOUNTER — Encounter (HOSPITAL_COMMUNITY): Payer: Self-pay | Admitting: *Deleted

## 2013-02-10 ENCOUNTER — Ambulatory Visit (HOSPITAL_COMMUNITY): Payer: Medicare Other

## 2013-02-10 ENCOUNTER — Other Ambulatory Visit: Payer: Medicare Other

## 2013-02-10 ENCOUNTER — Ambulatory Visit: Payer: Medicare Other

## 2013-02-10 ENCOUNTER — Inpatient Hospital Stay (HOSPITAL_COMMUNITY)
Admission: EM | Admit: 2013-02-10 | Discharge: 2013-02-25 | DRG: 003 | Disposition: A | Discharge status: Home Health | Payer: Medicare Other | Attending: Critical Care Medicine | Admitting: Critical Care Medicine

## 2013-02-10 DIAGNOSIS — R4182 Altered mental status, unspecified: Secondary | ICD-10-CM | POA: Diagnosis present

## 2013-02-10 DIAGNOSIS — R1310 Dysphagia, unspecified: Secondary | ICD-10-CM

## 2013-02-10 DIAGNOSIS — Z87891 Personal history of nicotine dependence: Secondary | ICD-10-CM

## 2013-02-10 DIAGNOSIS — J189 Pneumonia, unspecified organism: Secondary | ICD-10-CM

## 2013-02-10 DIAGNOSIS — E119 Type 2 diabetes mellitus without complications: Secondary | ICD-10-CM

## 2013-02-10 DIAGNOSIS — H9190 Unspecified hearing loss, unspecified ear: Secondary | ICD-10-CM | POA: Diagnosis present

## 2013-02-10 DIAGNOSIS — M129 Arthropathy, unspecified: Secondary | ICD-10-CM | POA: Diagnosis present

## 2013-02-10 DIAGNOSIS — N19 Unspecified kidney failure: Secondary | ICD-10-CM

## 2013-02-10 DIAGNOSIS — R1313 Dysphagia, pharyngeal phase: Secondary | ICD-10-CM | POA: Diagnosis present

## 2013-02-10 DIAGNOSIS — D631 Anemia in chronic kidney disease: Secondary | ICD-10-CM | POA: Diagnosis present

## 2013-02-10 DIAGNOSIS — E1129 Type 2 diabetes mellitus with other diabetic kidney complication: Secondary | ICD-10-CM | POA: Diagnosis present

## 2013-02-10 DIAGNOSIS — E872 Acidosis, unspecified: Secondary | ICD-10-CM | POA: Diagnosis present

## 2013-02-10 DIAGNOSIS — R5381 Other malaise: Secondary | ICD-10-CM | POA: Diagnosis present

## 2013-02-10 DIAGNOSIS — C320 Malignant neoplasm of glottis: Secondary | ICD-10-CM

## 2013-02-10 DIAGNOSIS — F411 Generalized anxiety disorder: Secondary | ICD-10-CM | POA: Diagnosis not present

## 2013-02-10 DIAGNOSIS — E559 Vitamin D deficiency, unspecified: Secondary | ICD-10-CM | POA: Diagnosis present

## 2013-02-10 DIAGNOSIS — E43 Unspecified severe protein-calorie malnutrition: Secondary | ICD-10-CM

## 2013-02-10 DIAGNOSIS — K219 Gastro-esophageal reflux disease without esophagitis: Secondary | ICD-10-CM | POA: Diagnosis present

## 2013-02-10 DIAGNOSIS — D638 Anemia in other chronic diseases classified elsewhere: Secondary | ICD-10-CM

## 2013-02-10 DIAGNOSIS — G4733 Obstructive sleep apnea (adult) (pediatric): Secondary | ICD-10-CM | POA: Diagnosis present

## 2013-02-10 DIAGNOSIS — N2581 Secondary hyperparathyroidism of renal origin: Secondary | ICD-10-CM | POA: Diagnosis present

## 2013-02-10 DIAGNOSIS — I10 Essential (primary) hypertension: Secondary | ICD-10-CM

## 2013-02-10 DIAGNOSIS — N179 Acute kidney failure, unspecified: Secondary | ICD-10-CM | POA: Diagnosis not present

## 2013-02-10 DIAGNOSIS — R000 Tachycardia, unspecified: Secondary | ICD-10-CM | POA: Diagnosis present

## 2013-02-10 DIAGNOSIS — N058 Unspecified nephritic syndrome with other morphologic changes: Secondary | ICD-10-CM | POA: Diagnosis present

## 2013-02-10 DIAGNOSIS — E875 Hyperkalemia: Secondary | ICD-10-CM

## 2013-02-10 DIAGNOSIS — J690 Pneumonitis due to inhalation of food and vomit: Principal | ICD-10-CM

## 2013-02-10 DIAGNOSIS — R7401 Elevation of levels of liver transaminase levels: Secondary | ICD-10-CM | POA: Diagnosis present

## 2013-02-10 DIAGNOSIS — G9340 Encephalopathy, unspecified: Secondary | ICD-10-CM | POA: Diagnosis present

## 2013-02-10 DIAGNOSIS — J9600 Acute respiratory failure, unspecified whether with hypoxia or hypercapnia: Secondary | ICD-10-CM

## 2013-02-10 DIAGNOSIS — E7800 Pure hypercholesterolemia, unspecified: Secondary | ICD-10-CM | POA: Diagnosis present

## 2013-02-10 DIAGNOSIS — N186 End stage renal disease: Secondary | ICD-10-CM

## 2013-02-10 DIAGNOSIS — I6529 Occlusion and stenosis of unspecified carotid artery: Secondary | ICD-10-CM | POA: Diagnosis present

## 2013-02-10 DIAGNOSIS — I4891 Unspecified atrial fibrillation: Secondary | ICD-10-CM | POA: Diagnosis not present

## 2013-02-10 DIAGNOSIS — C329 Malignant neoplasm of larynx, unspecified: Secondary | ICD-10-CM | POA: Diagnosis present

## 2013-02-10 DIAGNOSIS — R7402 Elevation of levels of lactic acid dehydrogenase (LDH): Secondary | ICD-10-CM | POA: Diagnosis present

## 2013-02-10 DIAGNOSIS — I120 Hypertensive chronic kidney disease with stage 5 chronic kidney disease or end stage renal disease: Secondary | ICD-10-CM | POA: Diagnosis present

## 2013-02-10 DIAGNOSIS — Z930 Tracheostomy status: Secondary | ICD-10-CM

## 2013-02-10 DIAGNOSIS — R0902 Hypoxemia: Secondary | ICD-10-CM

## 2013-02-10 DIAGNOSIS — IMO0001 Reserved for inherently not codable concepts without codable children: Secondary | ICD-10-CM | POA: Diagnosis present

## 2013-02-10 LAB — LIPASE, BLOOD: Lipase: 34 U/L (ref 11–59)

## 2013-02-10 LAB — URINALYSIS, ROUTINE W REFLEX MICROSCOPIC
Ketones, ur: NEGATIVE mg/dL
Leukocytes, UA: NEGATIVE
Nitrite: NEGATIVE
pH: 5.5 (ref 5.0–8.0)

## 2013-02-10 LAB — COMPREHENSIVE METABOLIC PANEL
Albumin: 2.7 g/dL — ABNORMAL LOW (ref 3.5–5.2)
BUN: 101 mg/dL — ABNORMAL HIGH (ref 6–23)
Calcium: 8.4 mg/dL (ref 8.4–10.5)
Chloride: 97 mEq/L (ref 96–112)
Creatinine, Ser: 11.22 mg/dL — ABNORMAL HIGH (ref 0.50–1.35)
Total Bilirubin: 0.3 mg/dL (ref 0.3–1.2)
Total Protein: 6.9 g/dL (ref 6.0–8.3)

## 2013-02-10 LAB — RETICULOCYTES
RBC.: 3.71 MIL/uL — ABNORMAL LOW (ref 4.22–5.81)
Retic Ct Pct: 1.6 % (ref 0.4–3.1)

## 2013-02-10 LAB — CBC WITH DIFFERENTIAL/PLATELET
Basophils Absolute: 0 10*3/uL (ref 0.0–0.1)
Eosinophils Absolute: 0 10*3/uL (ref 0.0–0.7)
HCT: 35.7 % — ABNORMAL LOW (ref 39.0–52.0)
Lymphs Abs: 0.6 10*3/uL — ABNORMAL LOW (ref 0.7–4.0)
MCH: 29.3 pg (ref 26.0–34.0)
MCHC: 32.2 g/dL (ref 30.0–36.0)
MCV: 90.8 fL (ref 78.0–100.0)
Monocytes Absolute: 0.6 10*3/uL (ref 0.1–1.0)
Neutro Abs: 8 10*3/uL — ABNORMAL HIGH (ref 1.7–7.7)
Platelets: 211 10*3/uL (ref 150–400)
RDW: 14.4 % (ref 11.5–15.5)
WBC Morphology: INCREASED

## 2013-02-10 LAB — BLOOD GAS, ARTERIAL
Bicarbonate: 19.6 mEq/L — ABNORMAL LOW (ref 20.0–24.0)
Expiratory PAP: 6
pCO2 arterial: 38.2 mmHg (ref 35.0–45.0)
pH, Arterial: 7.329 — ABNORMAL LOW (ref 7.350–7.450)
pO2, Arterial: 74.1 mmHg — ABNORMAL LOW (ref 80.0–100.0)

## 2013-02-10 LAB — GLUCOSE, CAPILLARY
Glucose-Capillary: 196 mg/dL — ABNORMAL HIGH (ref 70–99)
Glucose-Capillary: 104 mg/dL — ABNORMAL HIGH (ref 70–99)
Glucose-Capillary: 116 mg/dL — ABNORMAL HIGH (ref 70–99)
Glucose-Capillary: 149 mg/dL — ABNORMAL HIGH (ref 70–99)

## 2013-02-10 LAB — HEPATITIS B SURFACE ANTIBODY,QUALITATIVE: Hep B S Ab: POSITIVE — AB

## 2013-02-10 LAB — BASIC METABOLIC PANEL
BUN: 67 mg/dL — ABNORMAL HIGH (ref 6–23)
CO2: 17 mEq/L — ABNORMAL LOW (ref 19–32)
CO2: 22 mEq/L (ref 19–32)
Calcium: 8.1 mg/dL — ABNORMAL LOW (ref 8.4–10.5)
Calcium: 8 mg/dL — ABNORMAL LOW (ref 8.4–10.5)
Chloride: 105 mEq/L (ref 96–112)
Creatinine, Ser: 10.44 mg/dL — ABNORMAL HIGH (ref 0.50–1.35)
Creatinine, Ser: 6.97 mg/dL — ABNORMAL HIGH (ref 0.50–1.35)
GFR calc non Af Amer: 4 mL/min — ABNORMAL LOW (ref >90)
Glucose, Bld: 217 mg/dL — ABNORMAL HIGH (ref 70–99)
Glucose, Bld: 116 mg/dL — ABNORMAL HIGH (ref 70–99)

## 2013-02-10 LAB — LACTIC ACID, PLASMA: Lactic Acid, Venous: 1.9 mmol/L (ref 0.5–2.2)

## 2013-02-10 LAB — IRON AND TIBC: TIBC: 160 ug/dL — ABNORMAL LOW (ref 215–435)

## 2013-02-10 LAB — FERRITIN: Ferritin: 1264 ng/mL — ABNORMAL HIGH (ref 22–322)

## 2013-02-10 LAB — PHOSPHORUS: Phosphorus: 8.4 mg/dL — ABNORMAL HIGH (ref 2.3–4.6)

## 2013-02-10 LAB — STREP PNEUMONIAE URINARY ANTIGEN: Strep Pneumo Urinary Antigen: NEGATIVE

## 2013-02-10 LAB — CG4 I-STAT (LACTIC ACID): Lactic Acid, Venous: 3.82 mmol/L — ABNORMAL HIGH (ref 0.5–2.2)

## 2013-02-10 LAB — URINE MICROSCOPIC-ADD ON

## 2013-02-10 MED ORDER — DEXTROSE 50 % IV SOLN: 0.5000 | Freq: Once | INTRAVENOUS | Status: DC

## 2013-02-10 MED ORDER — SODIUM BICARBONATE 8.4 % IV SOLN: 50.0000 meq | Freq: Once | INTRAVENOUS | Status: DC

## 2013-02-10 MED ORDER — CALCIUM CARBONATE 1250 (500 CA) MG PO CAPS: 1250.0000 mg | ORAL_CAPSULE | Freq: Two times a day (BID) | ORAL | Status: DC

## 2013-02-10 MED ORDER — ONDANSETRON HCL 4 MG/2ML IJ SOLN
4.0000 mg | Freq: Once | INTRAMUSCULAR | Status: AC
  Administered 2013-02-10: 4 mg via INTRAVENOUS
  Filled 2013-02-10: qty 2

## 2013-02-10 MED ORDER — VANCOMYCIN HCL 10 G IV SOLR
1500.0000 mg | Freq: Once | INTRAVENOUS | Status: AC
  Administered 2013-02-10: 1500 mg via INTRAVENOUS
  Filled 2013-02-10: qty 1500

## 2013-02-10 MED ORDER — METOPROLOL TARTRATE 1 MG/ML IV SOLN: 5.0000 mg | Freq: Once | INTRAVENOUS | Status: AC

## 2013-02-10 MED ORDER — FENTANYL CITRATE 0.05 MG/ML IJ SOLN
INTRAMUSCULAR | Status: DC | PRN
  Administered 2013-02-10: 50 ug via INTRAVENOUS

## 2013-02-10 MED ORDER — SODIUM CHLORIDE 0.9 % IV SOLN: 100.0000 mL | INTRAVENOUS | Status: DC | PRN

## 2013-02-10 MED ORDER — ASPIRIN EC 81 MG PO TBEC
81.0000 mg | DELAYED_RELEASE_TABLET | Freq: Every day | ORAL | Status: DC
  Administered 2013-02-10: 81 mg via ORAL
  Filled 2013-02-10 (×3): qty 1

## 2013-02-10 MED ORDER — SODIUM CHLORIDE 0.9 % IV BOLUS (SEPSIS)
1000.0000 mL | Freq: Once | INTRAVENOUS | Status: AC
  Administered 2013-02-10: 1000 mL via INTRAVENOUS

## 2013-02-10 MED ORDER — FUROSEMIDE 10 MG/ML IJ SOLN
160.0000 mg | Freq: Once | INTRAMUSCULAR | Status: AC
  Administered 2013-02-10: 160 mg via INTRAVENOUS
  Filled 2013-02-10: qty 16

## 2013-02-10 MED ORDER — DEXTROSE 5 % IV SOLN
1.0000 g | Freq: Once | INTRAVENOUS | Status: AC
  Administered 2013-02-10: 1 g via INTRAVENOUS
  Filled 2013-02-10: qty 10

## 2013-02-10 MED ORDER — LIDOCAINE HCL (PF) 1 % IJ SOLN
5.0000 mL | INTRAMUSCULAR | Status: DC | PRN
  Filled 2013-02-10: qty 5

## 2013-02-10 MED ORDER — ASPIRIN 81 MG PO TABS: 81.0000 mg | ORAL_TABLET | Freq: Every day | ORAL | Status: DC

## 2013-02-10 MED ORDER — ONDANSETRON HCL 4 MG/2ML IJ SOLN
4.0000 mg | Freq: Four times a day (QID) | INTRAMUSCULAR | Status: DC | PRN
  Administered 2013-02-21: 4 mg via INTRAVENOUS
  Filled 2013-02-10: qty 2

## 2013-02-10 MED ORDER — CEFAZOLIN SODIUM-DEXTROSE 2-3 GM-% IV SOLR
2.0000 g | INTRAVENOUS | Status: AC
  Administered 2013-02-10: 2 g via INTRAVENOUS
  Filled 2013-02-10: qty 50

## 2013-02-10 MED ORDER — SODIUM CHLORIDE 0.9 % IV SOLN
INTRAVENOUS | Status: DC
  Administered 2013-02-10: 17:00:00 via INTRAVENOUS

## 2013-02-10 MED ORDER — ONDANSETRON HCL 4 MG PO TABS: 4.0000 mg | ORAL_TABLET | Freq: Four times a day (QID) | ORAL | Status: DC | PRN

## 2013-02-10 MED ORDER — VENLAFAXINE HCL 37.5 MG PO TABS
37.5000 mg | ORAL_TABLET | Freq: Two times a day (BID) | ORAL | Status: DC
  Administered 2013-02-10: 37.5 mg via ORAL
  Filled 2013-02-10 (×5): qty 1

## 2013-02-10 MED ORDER — ALTEPLASE 2 MG IJ SOLR
2.0000 mg | Freq: Once | INTRAMUSCULAR | Status: DC | PRN
  Filled 2013-02-10: qty 2

## 2013-02-10 MED ORDER — CALCIUM CARBONATE 1250 (500 CA) MG PO TABS
1.0000 | ORAL_TABLET | Freq: Two times a day (BID) | ORAL | Status: DC
  Filled 2013-02-10 (×5): qty 1

## 2013-02-10 MED ORDER — HEPARIN SODIUM (PORCINE) 5000 UNIT/ML IJ SOLN
5000.0000 [IU] | Freq: Three times a day (TID) | INTRAMUSCULAR | Status: DC
  Administered 2013-02-10 – 2013-02-23 (×34): 5000 [IU] via SUBCUTANEOUS
  Filled 2013-02-10 (×44): qty 1

## 2013-02-10 MED ORDER — INSULIN GLARGINE 100 UNIT/ML ~~LOC~~ SOLN
10.0000 [IU] | Freq: Once | SUBCUTANEOUS | Status: AC
  Administered 2013-02-10: 10 [IU] via SUBCUTANEOUS
  Filled 2013-02-10: qty 0.1

## 2013-02-10 MED ORDER — SODIUM CHLORIDE 0.9 % IV SOLN: INTRAVENOUS | Status: DC

## 2013-02-10 MED ORDER — ALBUMIN HUMAN 25 % IV SOLN
INTRAVENOUS | Status: AC
  Administered 2013-02-10: 50 g
  Filled 2013-02-10: qty 150

## 2013-02-10 MED ORDER — NEPRO/CARBSTEADY PO LIQD
237.0000 mL | ORAL | Status: DC | PRN
  Filled 2013-02-10: qty 237

## 2013-02-10 MED ORDER — MIDAZOLAM HCL 2 MG/2ML IJ SOLN
INTRAMUSCULAR | Status: DC | PRN
  Administered 2013-02-10: 1 mg via INTRAVENOUS

## 2013-02-10 MED ORDER — SODIUM CHLORIDE 0.9 % IV BOLUS (SEPSIS)
500.0000 mL | Freq: Once | INTRAVENOUS | Status: AC
  Administered 2013-02-10: 500 mL via INTRAVENOUS

## 2013-02-10 MED ORDER — AMLODIPINE BESYLATE 10 MG PO TABS
10.0000 mg | ORAL_TABLET | Freq: Every day | ORAL | Status: DC
  Filled 2013-02-10: qty 1

## 2013-02-10 MED ORDER — MAGNESIUM OXIDE 400 MG PO TABS: 400.0000 mg | ORAL_TABLET | Freq: Every day | ORAL | Status: DC

## 2013-02-10 MED ORDER — LIDOCAINE-PRILOCAINE 2.5-2.5 % EX CREA
1.0000 "application " | TOPICAL_CREAM | CUTANEOUS | Status: DC | PRN
  Filled 2013-02-10: qty 5

## 2013-02-10 MED ORDER — DOXAZOSIN MESYLATE 2 MG PO TABS
2.0000 mg | ORAL_TABLET | Freq: Every day | ORAL | Status: DC
  Filled 2013-02-10: qty 1

## 2013-02-10 MED ORDER — ALBUMIN HUMAN 25 % IV SOLN
INTRAVENOUS | Status: AC
  Filled 2013-02-10: qty 50

## 2013-02-10 MED ORDER — METOPROLOL TARTRATE 25 MG PO TABS
25.0000 mg | ORAL_TABLET | Freq: Two times a day (BID) | ORAL | Status: DC
  Administered 2013-02-10: 25 mg via ORAL
  Filled 2013-02-10 (×5): qty 1

## 2013-02-10 MED ORDER — INSULIN REGULAR NICU BOLUS VIA INFUSION: 10.0000 [IU] | Freq: Once | INTRAVENOUS | Status: DC

## 2013-02-10 MED ORDER — HYDROMORPHONE HCL PF 1 MG/ML IJ SOLN
1.0000 mg | INTRAMUSCULAR | Status: DC | PRN
  Filled 2013-02-10: qty 1

## 2013-02-10 MED ORDER — VANCOMYCIN HCL IN DEXTROSE 1-5 GM/200ML-% IV SOLN: 1000.0000 mg | INTRAVENOUS | Status: DC

## 2013-02-10 MED ORDER — METOPROLOL TARTRATE 1 MG/ML IV SOLN
2.5000 mg | Freq: Once | INTRAVENOUS | Status: AC
  Administered 2013-02-10: 2.5 mg via INTRAVENOUS
  Filled 2013-02-10: qty 5

## 2013-02-10 MED ORDER — FAMOTIDINE 20 MG PO TABS
20.0000 mg | ORAL_TABLET | Freq: Every day | ORAL | Status: DC
  Administered 2013-02-10: 20 mg via ORAL
  Filled 2013-02-10 (×3): qty 1

## 2013-02-10 MED ORDER — SODIUM CHLORIDE 0.9 % IJ SOLN
3.0000 mL | Freq: Two times a day (BID) | INTRAMUSCULAR | Status: DC
  Administered 2013-02-12 – 2013-02-25 (×20): 3 mL via INTRAVENOUS

## 2013-02-10 MED ORDER — TRAMADOL HCL 50 MG PO TABS: 100.0000 mg | ORAL_TABLET | Freq: Two times a day (BID) | ORAL | Status: DC | PRN

## 2013-02-10 MED ORDER — INSULIN ASPART 100 UNIT/ML ~~LOC~~ SOLN
0.0000 [IU] | SUBCUTANEOUS | Status: DC
  Administered 2013-02-10: 1 [IU] via SUBCUTANEOUS
  Administered 2013-02-10: 2 [IU] via SUBCUTANEOUS
  Administered 2013-02-11 – 2013-02-13 (×10): 1 [IU] via SUBCUTANEOUS
  Administered 2013-02-13: 3 [IU] via SUBCUTANEOUS
  Administered 2013-02-13 – 2013-02-14 (×4): 1 [IU] via SUBCUTANEOUS
  Administered 2013-02-14: 2 [IU] via SUBCUTANEOUS
  Administered 2013-02-14: 1 [IU] via SUBCUTANEOUS
  Filled 2013-02-10: qty 2

## 2013-02-10 MED ORDER — SODIUM CHLORIDE 0.9 % IV SOLN
1.0000 g | Freq: Once | INTRAVENOUS | Status: AC
  Administered 2013-02-10: 1 g via INTRAVENOUS
  Filled 2013-02-10: qty 10

## 2013-02-10 MED ORDER — SODIUM BICARBONATE 650 MG PO TABS
325.0000 mg | ORAL_TABLET | Freq: Three times a day (TID) | ORAL | Status: DC
Start: 2013-02-10 — End: 2013-02-11
  Administered 2013-02-10: 325 mg via ORAL
  Filled 2013-02-10 (×4): qty 0.5

## 2013-02-10 MED ORDER — SODIUM BICARBONATE 8.4 % IV SOLN
50.0000 meq | Freq: Once | INTRAVENOUS | Status: AC
  Administered 2013-02-10: 50 meq via INTRAVENOUS
  Filled 2013-02-10: qty 50

## 2013-02-10 MED ORDER — DEXTROSE 5 % IV SOLN
2.0000 g | Freq: Three times a day (TID) | INTRAVENOUS | Status: DC
  Filled 2013-02-10 (×2): qty 2

## 2013-02-10 MED ORDER — HEPARIN SODIUM (PORCINE) 1000 UNIT/ML DIALYSIS: 20.0000 [IU]/kg | INTRAMUSCULAR | Status: DC | PRN

## 2013-02-10 MED ORDER — HEPARIN SODIUM (PORCINE) 1000 UNIT/ML DIALYSIS
1000.0000 [IU] | INTRAMUSCULAR | Status: DC | PRN
  Filled 2013-02-10: qty 1

## 2013-02-10 MED ORDER — MORPHINE SULFATE (CONCENTRATE) 10 MG /0.5 ML PO SOLN: 10.0000 mg | ORAL | Status: DC | PRN

## 2013-02-10 MED ORDER — MAGIC MOUTHWASH W/LIDOCAINE
10.0000 mL | Freq: Four times a day (QID) | ORAL | Status: DC | PRN
  Administered 2013-02-10 (×2): 10 mL via ORAL
  Filled 2013-02-10: qty 10

## 2013-02-10 MED ORDER — INSULIN ASPART 100 UNIT/ML IV SOLN
10.0000 [IU] | Freq: Once | INTRAVENOUS | Status: DC
  Filled 2013-02-10: qty 0.1

## 2013-02-10 MED ORDER — DILTIAZEM LOAD VIA INFUSION
15.0000 mg | Freq: Once | INTRAVENOUS | Status: AC
  Administered 2013-02-10: 15 mg via INTRAVENOUS
  Filled 2013-02-10: qty 15

## 2013-02-10 MED ORDER — FENTANYL 25 MCG/HR TD PT72: 25.0000 ug | MEDICATED_PATCH | TRANSDERMAL | Status: DC

## 2013-02-10 MED ORDER — PENTAFLUOROPROP-TETRAFLUOROETH EX AERO
1.0000 "application " | INHALATION_SPRAY | CUTANEOUS | Status: DC | PRN
  Filled 2013-02-10: qty 103.5

## 2013-02-10 MED ORDER — DEXTROSE 5 % IV SOLN
2.0000 g | INTRAVENOUS | Status: DC
  Administered 2013-02-10: 2 g via INTRAVENOUS
  Filled 2013-02-10 (×2): qty 2

## 2013-02-10 MED ORDER — MAGNESIUM OXIDE 400 (241.3 MG) MG PO TABS
400.0000 mg | ORAL_TABLET | Freq: Every day | ORAL | Status: DC
  Administered 2013-02-10: 400 mg via ORAL
  Filled 2013-02-10 (×2): qty 1

## 2013-02-10 MED ORDER — METOPROLOL TARTRATE 1 MG/ML IV SOLN
INTRAVENOUS | Status: AC
  Administered 2013-02-10: 5 mg via INTRAVENOUS
  Filled 2013-02-10: qty 5

## 2013-02-10 MED ORDER — PANTOPRAZOLE SODIUM 40 MG PO TBEC
40.0000 mg | DELAYED_RELEASE_TABLET | Freq: Every day | ORAL | Status: DC
  Administered 2013-02-10: 40 mg via ORAL
  Filled 2013-02-10: qty 1

## 2013-02-10 MED ORDER — DEXTROSE 5 % IV SOLN
500.0000 mg | Freq: Once | INTRAVENOUS | Status: AC
  Administered 2013-02-10: 500 mg via INTRAVENOUS
  Filled 2013-02-10: qty 500

## 2013-02-10 MED ORDER — DILTIAZEM HCL 100 MG IV SOLR
5.0000 mg/h | INTRAVENOUS | Status: DC
  Administered 2013-02-10: 5 mg/h via INTRAVENOUS
  Administered 2013-02-11 (×2): 15 mg/h via INTRAVENOUS
  Administered 2013-02-11: 5 mg/h via INTRAVENOUS
  Administered 2013-02-12: 15 mg/h via INTRAVENOUS
  Administered 2013-02-12 – 2013-02-13 (×2): 5 mg/h via INTRAVENOUS
  Administered 2013-02-13: 15 mg/h via INTRAVENOUS
  Administered 2013-02-14 – 2013-02-15 (×3): 5 mg/h via INTRAVENOUS
  Filled 2013-02-10 (×13): qty 100

## 2013-02-10 MED ORDER — MORPHINE SULFATE (CONCENTRATE) 20 MG/ML PO SOLN: 10.0000 mg | ORAL | Status: DC | PRN

## 2013-02-10 NOTE — Consult Note (Signed)
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 Jearld Fenton) ,undergoing RT [name]) adm to [facility] 8/22 with confusion, dyspnea, hypoxia, hyperkalemia & AKI with cr 11/ BUN 101 - transferred to [facility] for rt IJ permacath placement then , emergent HD. PCCM consulted 8/22 pm for worsening dyspnea while on HD requiring bipap. CXR shows BL nodular infiltrates ? 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 Jearld Fenton) ,undergoing RT [name]) adm to [facility] 8/22 with confusion, dyspnea, hypoxia, hyperkalemia & AKI with cr 11/ BUN 101 - transferred to [facility] for rt IJ permacath placement then , emergent HD. PCCM consulted 8/22 pm for worsening dyspnea while on HD requiring bipap  PAST MEDICAL HISTORY :  Past Medical History  Diagnosis Date  . Diabetes mellitus   . Hypertension   . GERD (gastroesophageal reflux disease)   . Fibromyalgia   . Chest pain   . Carotid artery occlusion   . Chronic kidney disease     - Stage 5- follwed by Dr Hyman Hopes  . Complication of anesthesia     agiation when he awaken  . Neuropathy   . Arthritis   . Sleep apnea   . H/O vitamin D deficiency   . Hearing loss   . Hypercholesteremia   . Myalgia and myositis   . Vocal cord cancer 01/04/13    Invasive Squamous Cell Carcinoma of the Right and Left Vocal Cords   Past  Surgical History  Procedure Laterality Date  . Cholecystectomy  1993  . Rotator cuff repair  07/06/2011    right  . Carotid endarterectomy Left   . Coolonoscopy    . Colonoscopy w/ biopsies and polypectomy      Hx: of  . Microlaryngoscopy with laser N/A 01/04/2013    Procedure: MICROLARYNGOSCOPY WITH BIOPSY/LASER;  Surgeon: Suzanna Obey, MD;  Location: [facility];  Service: ENT;  Laterality: N/A;   Prior to Admission medications   Medication Sig Start Date End Date Taking? Authorizing Provider  Alum & Mag Hydroxide-Simeth (MAGIC MOUTHWASH W/LIDOCAINE) SOLN 1part nystatin,1part Maaloxplus,1part benadryl,3part 2%viscous lidocaine. Swallow 10 mL up to QID, before meals/bedtime 02/06/13  Yes Lonie Peak, MD  amLODipine (NORVASC) 10 MG tablet Take 10 mg by mouth daily.   Yes Historical Provider, MD  aspirin 81 MG tablet Take 81 mg by mouth daily.    Yes Historical Provider, MD  calcium carbonate 1250 MG capsule Take 1,250 mg by mouth 2 (two) times daily with a meal.   Yes Historical Provider, MD  doxazosin (CARDURA) 2 MG tablet Take 2 mg by mouth at bedtime.   Yes Historical Provider, MD  esomeprazole (NEXIUM) 40 MG capsule Take 40 mg by mouth daily before breakfast.   Yes Historical Provider, MD  fentaNYL (DURAGESIC - DOSED MCG/HR) 25 MCG/HR patch Place 1 patch onto the skin every 3 (three) days. Last patch  was applied 8/19 at 1600 02/07/13  Yes Historical Provider, MD  furosemide (LASIX) 40 MG tablet Take 40 mg by mouth daily.   Yes Historical Provider, MD  glipiZIDE (GLUCOTROL XL) 2.5 MG 24 hr tablet Take 2.5 mg by mouth daily.   Yes Historical Provider, MD  magnesium oxide (MAG-OX) 400 MG tablet Take 400 mg by mouth daily.   Yes Historical Provider, MD  metoprolol tartrate (LOPRESSOR) 25 MG tablet Take 25 mg by mouth 2 (two) times daily.    Yes Historical Provider, MD  morphine (ROXANOL) 20 MG/ML concentrated solution Take 10-20 mg by mouth every 2 (two) hours as needed for pain. 02/08/13  Yes  Maryln Gottron, MD  Omega-3 Fatty Acids (FISH OIL) 1200 MG CPDR Take 2,400 mg by mouth.   Yes Historical Provider, MD  ranitidine (ZANTAC) 300 MG capsule Take 300 mg by mouth at bedtime.   Yes Historical Provider, MD  sodium bicarbonate 325 MG tablet Take 325 mg by mouth 3 (three) times daily.    Yes Historical Provider, MD  traMADol (ULTRAM) 50 MG tablet Take 100 mg by mouth every 4 (four) hours as needed.    Yes Historical Provider, MD  venlafaxine (EFFEXOR) 37.5 MG tablet Take 37.5 mg by mouth 2 (two) times daily.   Yes Historical Provider, MD  Vitamin D, Ergocalciferol, (DRISDOL) 50000 UNITS CAPS Take 50,000 Units by mouth every 14 (fourteen) days. Next dose is due august 30th 02/05/13  Yes Historical Provider, MD  b complex vitamins capsule Take 1 capsule by mouth daily.    Historical Provider, MD   Allergies  Allergen Reactions  . Amoxicillin     Causes upset stomach    FAMILY HISTORY:  Family History  Problem Relation Age of Onset  . Adopted: Yes   SOCIAL HISTORY:  reports that he quit smoking about 16 years ago. His smoking use included Cigarettes. He has a 62 pack-year smoking history. He has never used smokeless tobacco. He reports that he does not drink alcohol or use illicit drugs.  REVIEW OF SYSTEMS:  Unobtainable on bipap  SUBJECTIVE:   VITAL SIGNS: Pulse Rate:  [106-127] 119 (08/22 1732) Resp:  [12-28] 20 (08/22 1649) BP: (83-160)/(51-85) 98/77 mmHg (08/22 1732) SpO2:  [80 %-98 %] 97 % (08/22 1724) FiO2 (%):  [100 %] 100 % (08/22 1532) Weight:  [86.3 kg (190 lb 4.1 oz)] 86.3 kg (190 lb 4.1 oz) (08/22 1733) HEMODYNAMICS:   VENTILATOR SETTINGS: Vent Mode:  [-]  FiO2 (%):  [100 %] 100 % INTAKE / OUTPUT: Intake/Output   None     PHYSICAL EXAMINATION: General:  Chronically ill, well nourished, on bipap Neuro:  Arousable, opens eyes, follows 1 step commands, drowsy, non focal HEENT:  Pupils 3mm RTL, no JVD, no stridor Cardiovascular:  s1s2 tachy, no rub,  murmur Lungs:  Decreased rt base, no rhonchi Abdomen:  Soft, non tender Musculoskeletal:  No edema Skin:  No rash  LABS:  CBC Recent Labs     02/10/13  1000  WBC  9.2  HGB  11.5*  HCT  35.7*  PLT  211   Coag's No results found for this basename: APTT, INR,  in the last 72 hours BMET Recent Labs     02/10/13  1000  02/10/13  1326  NA  135  137  K  7.0*  6.9*  CL  97  103  CO2  16*  17*  BUN  101*  100*  CREATININE  11.22*  10.44*  GLUCOSE  217*  217*   Electrolytes Recent Labs     02/10/13  1000  02/10/13  1326  CALCIUM  8.4  8.1*   Sepsis Markers No results found for this basename: LACTICACIDVEN, PROCALCITON, O2SATVEN,  in the last 72 hours ABG No results found for this basename: PHART, PCO2ART, PO2ART,  in the last 72 hours Liver Enzymes Recent Labs     02/10/13  1000  AST  190*  ALT  155*  ALKPHOS  84  BILITOT  0.3  ALBUMIN  2.7*   Cardiac Enzymes No results found for this basename: TROPONINI, PROBNP,  in the last 72 hours Glucose Recent Labs     02/10/13  1327  GLUCAP  196*    Imaging Dg Chest 2 View  02/10/2013   *RADIOLOGY REPORT*  Clinical Data: Tachycardia  CHEST - 2 VIEW  Comparison: 01/04/2013  Findings: Cardiac shadow is stable.  New bilateral lung infiltrates are seen within the lingula and right middle lobe as well as the left lower lobe.  No sizable effusion is seen.  No acute bony abnormality is noted.  IMPRESSION: New bilateral infiltrates.   Original Report Authenticated By: Alcide Clever, M.D.   Ir Fluoro Guide Cv Line Right  02/10/2013   *RADIOLOGY REPORT*  Clinical data: Acute worsening of renal insufficiency.  Needs access for hemodialysis.  TUNNELED HEMODIALYSIS CATHETER PLACEMENT WITH ULTRASOUND AND FLUOROSCOPIC GUIDANCE  Comparison: None available  Technique: The procedure, risks, benefits, and alternatives were explained to the patient.  Questions regarding the procedure were encouraged and answered.  The patient understands and  consents to the procedure.  As antibiotic prophylaxis, cefazolin 1 grain was ordered pre- procedure and administered intravenously within one hour of incision.  Patency of the right IJ vein was confirmed with ultrasound with image documentation. An appropriate skin site was determined. Region was prepped using maximum barrier technique including cap and mask, sterile gown, sterile gloves, large sterile sheet, and Chlorhexidine   as cutaneous antisepsis. The region was infiltrated locally with 1% lidocaine.  Intravenous Fentanyl and Versed were administered as conscious sedation during continuous cardiorespiratory monitoring by the radiology RN, with a total moderate sedation time of nine minutes.  Under real-time ultrasound guidance, the right IJ vein was accessed with a 21 gauge micropuncture needle; the needle tip within the vein was confirmed with ultrasound image documentation.   Needle exchanged over the 018 guidewire for transitional dilator, which allowed advancement of a Benson wire into the IVC. Over this, an MPA catheter was advanced. A Hemosplit 19 hemodialysis catheter was tunneled from the right anterior chest wall approach to the right IJ dermatotomy site. The MPA catheter was exchanged over an Amplatz wire for serial vascular dilators which allow placement of a peel- away sheath, through which the catheter was advanced under intermittent fluoroscopy, positioned with its tips in the proximal and midright atrium. Spot chest radiograph confirms good catheter position. No pneumothorax. Catheter was flushed and primed per protocol. Catheter secured externally with O Prolene sutures. The right IJ   dermatotomy site was closed with  Dermabond. No immediate complication.  Fluoroscopy time: 15 seconds  IMPRESSION:  1. Technically successful placement of tunneled right IJ hemodialysis catheter with ultrasound and fluoroscopic guidance. Ready for routine use.   Original Report Authenticated By: D. Andria Rhein,  MD   Ir US Guide Vasc Access Right  02/10/2013   *RADIOLOGY REPORT*  Clinical data: Acute worsening of renal insufficiency.  Needs access for hemodialysis.  TUNNELED HEMODIALYSIS  CATHETER PLACEMENT WITH ULTRASOUND AND FLUOROSCOPIC GUIDANCE  Comparison: None available  Technique: The procedure, risks, benefits, and alternatives were explained to the patient.  Questions regarding the procedure were encouraged and answered.  The patient understands and consents to the procedure.  As antibiotic prophylaxis, cefazolin 1 grain was ordered pre- procedure and administered intravenously within one hour of incision.  Patency of the right IJ vein was confirmed with ultrasound with image documentation. An appropriate skin site was determined. Region was prepped using maximum barrier technique including cap and mask, sterile gown, sterile gloves, large sterile sheet, and Chlorhexidine   as cutaneous antisepsis. The region was infiltrated locally with 1% lidocaine.  Intravenous Fentanyl and Versed were administered as conscious sedation during continuous cardiorespiratory monitoring by the radiology RN, with a total moderate sedation time of nine minutes.  Under real-time ultrasound guidance, the right IJ vein was accessed with a 21 gauge micropuncture needle; the needle tip within the vein was confirmed with ultrasound image documentation.   Needle exchanged over the 018 guidewire for transitional dilator, which allowed advancement of a Benson wire into the IVC. Over this, an MPA catheter was advanced. A Hemosplit 19 hemodialysis catheter was tunneled from the right anterior chest wall approach to the right IJ dermatotomy site. The MPA catheter was exchanged over an Amplatz wire for serial vascular dilators which allow placement of a peel- away sheath, through which the catheter was advanced under intermittent fluoroscopy, positioned with its tips in the proximal and midright atrium. Spot chest radiograph confirms good  catheter position. No pneumothorax. Catheter was flushed and primed per protocol. Catheter secured externally with O Prolene sutures. The right IJ   dermatotomy site was closed with  Dermabond. No immediate complication.  Fluoroscopy time: 15 seconds  IMPRESSION:  1. Technically successful placement of tunneled right IJ hemodialysis catheter with ultrasound and fluoroscopic guidance. Ready for routine use.   Original Report Authenticated By: D. Andria Rhein, MD     CXR: no pnthx, BL patchy  infx -L>R  ASSESSMENT / PLAN:  PULMONARY A:Acute respiratory failure - /aspn pna vs fluid overload No stridor P:   Bipap for HD , then change to venti mask If worsens, may need intubation If fluid-should improve with HD Albuterol/atrovent nebs q 4h  CARDIOVASCULAR A: Htn P:  Hold lopressor/amlodipin Chk ekg/ Tp for completion  RENAL A:  Was stg V CKD  -Now ESRD Hyperkalemia Uremia P:   HD with fluid removal, low K  GASTROINTESTINAL A:  No issues P:   Npo for now  HEMATOLOGIC A:  No issues P:  Follow WC  INFECTIOUS A:  Aspiration pna vs CAP P:   Ceftx/azithro ok  ENDOCRINE A:  DM-2   P:   SSI  NEUROLOGIC A:  Acute encephalopathy ? Uremic vs narcotics for procedure P:   Follow after HD, doubt CVA since non focal - defer imaging for now  TODAY'S SUMMARY: Unclear cause of worsening dyspnea- BL infx may be fluid rather than pna, reassess after HD, ct bipap for now OK to transfer to ICU after HD  I have personally obtained a history, examined the patient, evaluated laboratory and imaging results, formulated the assessment and plan and placed orders. CRITICAL CARE: The patient is critically ill with multiple organ systems failure and requires high complexity decision making for assessment and support, frequent evaluation and titration of therapies, application of advanced monitoring technologies and extensive interpretation of multiple databases. Critical Care Time devoted to  patient care services described  in this note is 50 minutes.   Oretha Milch  Pulmonary and Critical Care Medicine [facility] Pager: [phone]  02/10/2013, 5:39 PM

## 2013-02-10 NOTE — Consult Note (Signed)
HPI: Victor Lane is an 67 y.o. male with advanced stage V CKD who is now uremic and hyperkalemic in need of urgent dialysis. He is being transferred from [facility] to [facility] so that HD can be initiated. IR is requested to placed HD catheter. Chart, status, PMHx, and meds reviewed.   Past Medical History:  Past Medical History  Diagnosis Date  . Diabetes mellitus   . Hypertension   . GERD (gastroesophageal reflux disease)   . Fibromyalgia   . Chest pain   . Carotid artery occlusion   . Chronic kidney disease     - Stage 5- follwed by Dr Hyman Hopes  . Complication of anesthesia     agiation when he awaken  . Neuropathy   . Arthritis   . Sleep apnea   . H/O vitamin D deficiency   . Hearing loss   . Hypercholesteremia   . Myalgia and myositis   . 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 Cholecystectomy  1993  . Rotator cuff repair  07/06/2011    right  . Carotid endarterectomy Left   . Coolonoscopy    . Colonoscopy w/ biopsies and polypectomy      Hx: of  . Microlaryngoscopy with laser N/A 01/04/2013    Procedure: MICROLARYNGOSCOPY WITH BIOPSY/LASER;  Surgeon: Suzanna Obey, MD;  Location: [facility] OR;  Service: ENT;  Laterality: N/A;    Family History:  Family History  Problem Relation Age of Onset  . Adopted: Yes    Social History:  reports that he quit smoking about 16 years ago. His smoking use included Cigarettes. He has a 62 pack-year smoking history. He has never used smokeless tobacco. He reports that he does not drink alcohol or use illicit drugs.  Allergies:  Allergies  Allergen Reactions  . Amoxicillin     Causes upset stomach    Medications:   Medication List    ASK your doctor about these medications       amLODipine 10 MG tablet  Commonly known as:  NORVASC  Take 10 mg by mouth daily.     aspirin 81 MG  tablet  Take 81 mg by mouth daily.     b complex vitamins capsule  Take 1 capsule by mouth daily.     calcium carbonate 1250 MG capsule  Take 1,250 mg by mouth 2 (two) times daily with a meal.     doxazosin 2 MG tablet  Commonly known as:  CARDURA  Take 2 mg by mouth at bedtime.     esomeprazole 40 MG capsule  Commonly known as:  NEXIUM  Take 40 mg by mouth daily before breakfast.     fentaNYL 25 MCG/HR patch  Commonly known as:  DURAGESIC - dosed mcg/hr  Place 1 patch onto the skin every 3 (three) days. Last patch was applied 8/19 at 1600     Fish Oil 1200 MG Cpdr  Take 2,400 mg by mouth.     furosemide 40 MG tablet  Commonly known as:  LASIX  Take 40 mg by mouth daily.     glipiZIDE 2.5 MG 24 hr tablet  Commonly known as:  GLUCOTROL XL  Take 2.5 mg by mouth daily.     magic mouthwash w/lidocaine Soln  1part nystatin,1part Maaloxplus,1part benadryl,3part 2%viscous lidocaine. Swallow 10 mL up to QID,  before meals/bedtime     magnesium oxide 400 MG tablet  Commonly known as:  MAG-OX  Take 400 mg by mouth daily.     metoprolol tartrate 25 MG tablet  Commonly known as:  LOPRESSOR  Take 25 mg by mouth 2 (two) times daily.     morphine 20 MG/ML concentrated solution  Commonly known as:  ROXANOL  Take 10-20 mg by mouth every 2 (two) hours as needed for pain.     ranitidine 300 MG capsule  Commonly known as:  ZANTAC  Take 300 mg by mouth at bedtime.     sodium bicarbonate 325 MG tablet  Take 325 mg by mouth 3 (three) times daily.     traMADol 50 MG tablet  Commonly known as:  ULTRAM  Take 100 mg by mouth every 4 (four) hours as needed.     venlafaxine 37.5 MG tablet  Commonly known as:  EFFEXOR  Take 37.5 mg by mouth 2 (two) times daily.     Vitamin D (Ergocalciferol) 50000 UNITS Caps capsule  Commonly known as:  DRISDOL  Take 50,000 Units by mouth every 14 (fourteen) days. Next dose is due august 30th        Please HPI for pertinent positives,  otherwise complete 10 system ROS negative.  Physical Exam: BP 128/59  Pulse 110  Resp 20  SpO2 96% There is no weight on file to calculate BMI.   General Appearance:  Alert, but fatigued and diaphoretic male.  Head:  Normocephalic, without obvious abnormality, atraumatic  ENT: Unremarkable  Neck: Supple, symmetrical, trachea midline  Lungs:   Clear to auscultation bilaterally, no w/r/r  Chest Wall:  No tenderness or deformity  Heart:  Tachy regular rate and rhythm,  Abdomen:   Soft, non-tender, non distended.  Extremities: Extremities normal, atraumatic, no cyanosis or edema  Pulses: 2+ and symmetric  Neurologic: Normal affect, no gross deficits.   Results for orders placed during the hospital encounter of 02/10/13 (from the past 48 hour(s))  CBC WITH DIFFERENTIAL     Status: Abnormal   Collection Time    02/10/13 10:00 AM      Result Value Range   WBC 9.2  4.0 - 10.5 K/uL   RBC 3.93 (*) 4.22 - 5.81 MIL/uL   Hemoglobin 11.5 (*) 13.0 - 17.0 g/dL   HCT 96.0 (*) 45.4 - 09.8 %   MCV 90.8  78.0 - 100.0 fL   MCH 29.3  26.0 - 34.0 pg   MCHC 32.2  30.0 - 36.0 g/dL   RDW 11.9  14.7 - 82.9 %   Platelets 211  150 - 400 K/uL   Neutrophils Relative % 86 (*) 43 - 77 %   Lymphocytes Relative 7 (*) 12 - 46 %   Monocytes Relative 7  3 - 12 %   Eosinophils Relative 0  0 - 5 %   Basophils Relative 0  0 - 1 %   Neutro Abs 8.0 (*) 1.7 - 7.7 K/uL   Lymphs Abs 0.6 (*) 0.7 - 4.0 K/uL   Monocytes Absolute 0.6  0.1 - 1.0 K/uL   Eosinophils Absolute 0.0  0.0 - 0.7 K/uL   Basophils Absolute 0.0  0.0 - 0.1 K/uL   WBC Morphology INCREASED BANDS (>20% BANDS)    COMPREHENSIVE METABOLIC PANEL     Status: Abnormal   Collection Time    02/10/13 10:00 AM      Result Value Range   Sodium 135  135 -  145 mEq/L   Comment: REPEATED TO VERIFY   Potassium 7.0 (*) 3.5 - 5.1 mEq/L   Comment: REPEATED TO VERIFY     NO VISIBLE HEMOLYSIS     CRITICAL RESULT CALLED TO, READ BACK BY AND VERIFIED WITH:      BINGHAM,S. RN@1110  02/10/13 HAMER,N.   Chloride 97  96 - 112 mEq/L   Comment: REPEATED TO VERIFY   CO2 16 (*) 19 - 32 mEq/L   Comment: REPEATED TO VERIFY   Glucose, Bld 217 (*) 70 - 99 mg/dL   BUN 161 (*) 6 - 23 mg/dL   Creatinine, Ser 09.60 (*) 0.50 - 1.35 mg/dL   Calcium 8.4  8.4 - 45.4 mg/dL   Total Protein 6.9  6.0 - 8.3 g/dL   Albumin 2.7 (*) 3.5 - 5.2 g/dL   AST 098 (*) 0 - 37 U/L   ALT 155 (*) 0 - 53 U/L   Alkaline Phosphatase 84  39 - 117 U/L   Total Bilirubin 0.3  0.3 - 1.2 mg/dL   GFR calc non Af Amer 4 (*) >90 mL/min   GFR calc Af Amer 5 (*) >90 mL/min   Comment: (NOTE)     The eGFR has been calculated using the CKD EPI equation.     This calculation has not been validated in all clinical situations.     eGFR's persistently <90 mL/min signify possible Chronic Kidney     Disease.  LIPASE, BLOOD     Status: None   Collection Time    02/10/13 10:00 AM      Result Value Range   Lipase 34  11 - 59 U/L  CG4 I-STAT (LACTIC ACID)     Status: Abnormal   Collection Time    02/10/13 10:12 AM      Result Value Range   Lactic Acid, Venous 3.82 (*) 0.5 - 2.2 mmol/L  RETICULOCYTES     Status: Abnormal   Collection Time    02/10/13  1:26 PM      Result Value Range   Retic Ct Pct 1.6  0.4 - 3.1 %   RBC. 3.71 (*) 4.22 - 5.81 MIL/uL   Retic Count, Manual 59.4  19.0 - 186.0 K/uL  LACTIC ACID, PLASMA     Status: None   Collection Time    02/10/13  1:26 PM      Result Value Range   Lactic Acid, Venous 1.9  0.5 - 2.2 mmol/L  GLUCOSE, CAPILLARY     Status: Abnormal   Collection Time    02/10/13  1:27 PM      Result Value Range   Glucose-Capillary 196 (*) 70 - 99 mg/dL   Dg Chest 2 View  07/10/1476   *RADIOLOGY REPORT*  Clinical Data: Tachycardia  CHEST - 2 VIEW  Comparison: 01/04/2013  Findings: Cardiac shadow is stable.  New bilateral lung infiltrates are seen within the lingula and right middle lobe as well as the left lower lobe.  No sizable effusion is seen.  No acute bony  abnormality is noted.  IMPRESSION: New bilateral infiltrates.   Original Report Authenticated By: Alcide Clever, M.D.    Assessment/Plan ESRD with acute uremia and hyperkalemia in need of urgent dialysis. Discussed placement of HD catheter. Would like to get tunneled catheter placed. Explained procedure, risks, complications, limited use of sedation given hyperkalemia. Labs reviewed. Pt to be transferred to [facility] and arrive at Radiology RN station. To be admitted to SDU after HD catheter placed. Consent signed  in chart by wife.  Brayton El PA-C 02/10/2013, 2:21 PM

## 2013-02-10 NOTE — ED Provider Notes (Addendum)
CSN: [id number]     Arrival date & time 02/10/13  4098 History     First MD Initiated Contact with Patient 02/10/13 [phone]     Chief Complaint  Patient presents with  . Drug Overdose   (Consider location/radiation/quality/duration/timing/severity/associated sxs/prior Treatment) HPI Comments: Pt recently started on fentanyl patch yesterday and was taking hycet pain control.  But over the last week has been having trouble swallowing and severe throat pain from radiation.  She noticed at 1pm yesterday difficulty arousing him but was able to eventually wake him and get him some water and a little food.  Then due to still being lethargic she removed fentanyl patch at 3 and has not given him any other pain killers.  However this am he was still groggy and unable to get out of bed and confused which is very unusual for him.  He also has had nausea and vomiting today and the vomit appeared to have some dark blood in it.  Patient is a 67 y.o. male presenting with altered mental status and vomiting. The history is provided by the patient and the spouse.  Altered Mental Status Associated symptoms: abdominal pain and vomiting   Emesis Severity:  Mild Duration:  4 hours Timing:  Intermittent Number of daily episodes:  2 Emesis appearance: appeared to have dark blood in vomit today. Progression:  Unchanged Chronicity:  New Recent urination:  Normal Relieved by:  Nothing Exacerbated by: severe pain when trying to swallow any liquids so had just stopped trying to eat or drink. Ineffective treatments: trying the magic mouthwash but unable to swallow it despite multiple tries. Associated symptoms: abdominal pain, chills and sore throat   Associated symptoms comment:  Diaphoresis, nausea and vomiting only in last night and this am Risk factors comment:  Vocal cord cancer currently undergoing radiation to the neck   Past Medical History  Diagnosis Date  . Diabetes mellitus   . Hypertension   . GERD  (gastroesophageal reflux disease)   . Fibromyalgia   . Chest pain   . Carotid artery occlusion   . Chronic kidney disease     - Stage 5- follwed by Dr Hyman Hopes  . Complication of anesthesia     agiation when he awaken  . Neuropathy   . Arthritis   . Sleep apnea   . H/O vitamin D deficiency   . Hearing loss   . Hypercholesteremia   . Myalgia and myositis   . 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 Cholecystectomy  1993  . Rotator cuff repair  07/06/2011    right  . Carotid endarterectomy Left   . Coolonoscopy    . Colonoscopy w/ biopsies and polypectomy      Hx: of  . Microlaryngoscopy with laser N/A 01/04/2013    Procedure: MICROLARYNGOSCOPY WITH BIOPSY/LASER;  Surgeon: Suzanna Obey, MD;  Location: [facility] OR;  Service: ENT;  Laterality: N/A;   Family History  Problem Relation Age of Onset  . Adopted: Yes   History  Substance Use Topics  . Smoking status: Former Smoker -- 2.00 packs/day for 31 years    Types: Cigarettes    Quit date: 06/22/1996  . Smokeless tobacco: Never Used  . Alcohol Use: No    Review of Systems  Constitutional: Positive for chills.  HENT: Positive for sore throat and trouble swallowing.   Respiratory: Positive for cough.  Negative for shortness of breath.   Gastrointestinal: Positive for vomiting and abdominal pain.       Minimal stools.  Last BM 3 days ago and normal in color  Genitourinary: Negative for dysuria.  All other systems reviewed and are negative.    Allergies  Amoxicillin  Home Medications   Current Outpatient Rx  Name  Route  Sig  Dispense  Refill  . Alum & Mag Hydroxide-Simeth (MAGIC MOUTHWASH W/LIDOCAINE) SOLN      1part nystatin,1part Maaloxplus,1part benadryl,3part 2%viscous lidocaine. Swallow 10 mL up to QID, before meals/bedtime   480 mL   5   . amLODipine (NORVASC) 10 MG tablet   Oral   Take 10 mg by mouth  daily.         Marland Kitchen aspirin 81 MG tablet   Oral   Take 81 mg by mouth daily.          Marland Kitchen b complex vitamins capsule   Oral   Take 1 capsule by mouth daily.         . calcium carbonate (TUMS - DOSED IN MG ELEMENTAL CALCIUM) 500 MG chewable tablet   Oral   Chew 1 tablet by mouth daily.         Marland Kitchen docusate sodium (COLACE) 100 MG capsule   Oral   Take 1 capsule (100 mg total) by mouth 2 (two) times daily.   60 capsule   1   . doxazosin (CARDURA) 2 MG tablet   Oral   Take 2 mg by mouth at bedtime.         Marland Kitchen emollient (BIAFINE) cream   Topical   Apply topically as needed.         Marland Kitchen esomeprazole (NEXIUM) 40 MG capsule   Oral   Take 40 mg by mouth daily before breakfast.         . fentaNYL (DURAGESIC - DOSED MCG/HR) 25 MCG/HR patch   Transdermal   Place 1 patch (25 mcg total) onto the skin every 3 (three) days.   5 patch   0   . furosemide (LASIX) 40 MG tablet   Oral   Take 40 mg by mouth daily.         Marland Kitchen glipiZIDE (GLUCOTROL XL) 2.5 MG 24 hr tablet   Oral   Take 2.5 mg by mouth daily.         Marland Kitchen HYDROcodone-acetaminophen (HYCET) 7.5-325 mg/15 ml solution   Oral   Take 15 mL by mouth every 4 (four) hours as needed for pain.   480 mL   1   . magnesium oxide (MAG-OX) 400 MG tablet   Oral   Take 400 mg by mouth daily.         . metoprolol tartrate (LOPRESSOR) 25 MG tablet   Oral   Take 25 mg by mouth 2 (two) times daily.          Marland Kitchen morphine (ROXANOL) 20 MG/ML concentrated solution   Oral   Take 0.5 mL (10 mg total) by mouth every 2 (two) hours as needed for pain.   30 mL   0   . Omega-3 Fatty Acids (FISH OIL) 1000 MG CAPS   Oral   Take 4 capsules by mouth daily.          . ranitidine (ZANTAC) 300 MG capsule   Oral   Take 300 mg by mouth at bedtime.         . sodium bicarbonate 325 MG tablet  Oral   Take 325 mg by mouth 4 (four) times daily.         . traMADol (ULTRAM) 50 MG tablet   Oral   Take 100 mg by mouth every 4 (four)  hours as needed.          . venlafaxine (EFFEXOR) 37.5 MG tablet   Oral   Take 37.5 mg by mouth 2 (two) times daily.         . Vitamin D, Ergocalciferol, (DRISDOL) 50000 UNITS CAPS   Oral   Take 50,000 Units by mouth every 7 (seven) days.          BP 140/69  Pulse 106  Resp 20  SpO2 93% Physical Exam  Nursing note and vitals reviewed. Constitutional: He is oriented to person, place, and time. He appears well-developed and well-nourished. No distress.  HENT:  Head: Normocephalic and atraumatic.  Mouth/Throat: Oropharynx is clear and moist. Mucous membranes are pale and dry.  Severe dry mouth.  Minimal dried blood on the roof of the mouth.  No oral lesions noted  Eyes: Conjunctivae and EOM are normal. Pupils are equal, round, and reactive to light.  Neck: Normal range of motion. Neck supple.  Cardiovascular: Regular rhythm and intact distal pulses.  Tachycardia present.   No murmur heard. Pulmonary/Chest: Effort normal and breath sounds normal. No respiratory distress. He has no wheezes. He has no rales.  Abdominal: Soft. Bowel sounds are normal. He exhibits no distension. There is tenderness in the right upper quadrant, epigastric area and left upper quadrant. There is no rebound, no guarding and no CVA tenderness.  Musculoskeletal: Normal range of motion. He exhibits no edema and no tenderness.  Neurological: He is alert and oriented to person, place, and time.  Skin: Skin is warm and dry. No rash noted. No erythema. There is pallor.  Psychiatric: He has a normal mood and affect. His behavior is normal.    ED Course   Procedures (including critical care time)  Labs Reviewed  CBC WITH DIFFERENTIAL - Abnormal; Notable for the following:    RBC 3.93 (*)    Hemoglobin 11.5 (*)    HCT 35.7 (*)    Neutrophils Relative % 86 (*)    Lymphocytes Relative 7 (*)    Neutro Abs 8.0 (*)    Lymphs Abs 0.6 (*)    All other components within normal limits  COMPREHENSIVE METABOLIC  PANEL - Abnormal; Notable for the following:    Potassium 7.0 (*)    CO2 16 (*)    Glucose, Bld 217 (*)    BUN 101 (*)    Creatinine, Ser 11.22 (*)    Albumin 2.7 (*)    AST 190 (*)    ALT 155 (*)    GFR calc non Af Amer 4 (*)    GFR calc Af Amer 5 (*)    All other components within normal limits  CG4 I-STAT (LACTIC ACID) - Abnormal; Notable for the following:    Lactic Acid, Venous 3.82 (*)    All other components within normal limits  LIPASE, BLOOD  URINALYSIS, ROUTINE W REFLEX MICROSCOPIC    Date: 02/10/2013  Rate: 105  Rhythm: sinus tachycardia  QRS Axis: normal  Intervals: normal  ST/T Wave abnormalities: normal  Conduction Disutrbances:none  Narrative Interpretation:   Old EKG Reviewed: unchanged   Dg Chest 2 View  02/10/2013   *RADIOLOGY REPORT*  Clinical Data: Tachycardia  CHEST - 2 VIEW  Comparison: 01/04/2013  Findings: Cardiac  shadow is stable.  New bilateral lung infiltrates are seen within the lingula and right middle lobe as well as the left lower lobe.  No sizable effusion is seen.  No acute bony abnormality is noted.  IMPRESSION: New bilateral infiltrates.   Original Report Authenticated By: Alcide Clever, M.D.   CRITICAL CARE Performed by: Gwyneth Sprout Total critical care time: 45 Critical care time was exclusive of separately billable procedures and treating other patients. Critical care was necessary to treat or prevent imminent or life-threatening deterioration. Critical care was time spent personally by me on the following activities: development of treatment plan with patient and/or surrogate as well as nursing, discussions with consultants, evaluation of patient's response to treatment, examination of patient, obtaining history from patient or surrogate, ordering and performing treatments and interventions, ordering and review of laboratory studies, ordering and review of radiographic studies, pulse oximetry and re-evaluation of patient's condition.  1.  Renal failure   2. Hyperkalemia   3. CAP (community acquired pneumonia)   4. Hypoxia     MDM   Patient presents by EMS due to decreased mental status, not tolerating by mouth's and vomiting of dark material. Patient has a history of cancer currently undergoing radiation. Per his wife he was placed on patches and given-pain. Around 1 PM the wife noticed that patient was. Hard to arouse him so at 3 PM she removed double patch and has not given him any other pain medications. However he's continued to be groggy, confused in life states this morning she was unable to get him out of bed because he was diffusely weak and had 2 episodes of vomiting this morning that was dark and appeared to be blood. Patient has been unable to tolerate by mouth's due to pain and has not eaten or drinking anything for several days. On exam patient is awake and alert he is able to answer questions but he appears chronically ill. He is pale and hydrated. Patient is tachycardic and has mild upper abdominal pain. The mouth or some dried blood on the roof of his mouth but no signs of thrush.  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  Also pt has had some coughing on exam and sats are 80% RA.  This is new as pt does not routinely use O2 at home.  Patient given nausea medication as well as IV fluids. Patient last received IV fluids on Tuesday by his oncologist which did improve his symptoms.  CBC, CMP, lipase, UA, lactate, chest x-ray pending. EKG showed sinus tachycardia otherwise normal.  10:38 AM CXR shows bilateral new infiltrates which could be CAP vs aspiration.  Pt does have hx of CKD with last Cr of 7.8 but no signs of fluid overload today.  Lactate is 3.8.  Pt feeling better after 1L of fluid.  CMP still pending.  Will cover with rocephin and azithro.  11:23 AM Patient found to have a creatinine of 11.5 with a potassium of 7. EKG showed peak  T waves or widening of the QRS.  That some of his chronic renal disease has been exacerbated by prerenal azotemia. Patient given bicarbonate, insulin, calcium gluconate. Will continue fluids at this time. Consistent with renal and hospitalist for further care.  11:44 AM Spoke with renal and they request that [facility] needs emergent dialysis but agreed with ongoing hydration at this time.   Gwyneth Sprout, MD 02/10/13 1125  Gwyneth Sprout, MD 02/10/13 1145  Gwyneth Sprout, MD 02/10/13 1218

## 2013-02-10 NOTE — Significant Event (Signed)
Pt with persistent tachycardia while on 15 mg/hr cardizem gtt.  Will give lopressor 5 mg IV x one and monitor.  Coralyn Helling, MD 02/10/2013, 11:45 PM

## 2013-02-10 NOTE — H&P (Signed)
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 Jearld Fenton, final pathology report 01/04/2013) currently undergoing radiation therapy under Dr. Basilio Cairo care, now presenting to [facility] long emergency department after found to be more lethargic at home and difficult to by his wife. Patient explains he has felt progressively weaker over the past several days and unable to ambulate, went to bed last night and provide unable to wake up this morning. Wife also explains patient has been more confused over the past several days. Patient also reports progressively worsening shortness of breath and feeling that he cannot take full deep breath, worse with exertion and somewhat improved with rest, 2-3 pillow orthopnea. Patient denies chest pain, no specific abdominal concerns. Patient explains he is still able to void but has sensation of incomplete voiding. Patient denies dysuria, urinary urgency or frequency, no flank pain, no fevers, and no chills. Patient denies specific focal neurological symptoms, no dizziness, no palpitations, no visual changes. Patient explains he was first diagnosed with kidney disease about 7 years ago but has never had any workup done. He has an appointment scheduled with Dr. Hyman Hopes this month for further discussion on peritoneal dialysis. Patient reports he is not interested in other forms of hemodialysis other than peritoneal one.  In the emergency department, patient found to be hypoxic with oxygen saturation in the 80s,  improved with 4 L of nasal cannula. Electrolyte panel significant for hyperkalemia K = 7.0, creatinine equal 11.22. [facility] asked to admit to [facility] hospital for further evaluation and management and due to hypoxia, SDU bed requested.   Assessment and Plan:  Principal Problem:   Altered mental status - Most likely secondary to end-stage kidney disease, uremia - Will plan on transferring patient to [facility] to step down unit for further evaluation and management - Nephrology team has been notified - Patient's mental status appeared to be improved since admission, per patient and wife's report Active Problems:   Glottis carcinoma - Will notify Dr. Basilio Cairo of patient's admission   Acute respiratory failure - Most likely secondary to vascular congestion - Continue oxygen via nasal cannula and monitor vitals and step down unit - Bilateral infiltrates noted on chest x-ray but likely secondary to volume congestion rather than infectious etiology - Patient has received Zithromax and Rocephin in emergency department but I will hold off on IV antibiotics at this time is no clear infectious etiology noted - If patient develops fevers, cough, leukocytosis, will consider addition of antibiotics   Hyperkalemia - In the setting of end-stage kidney disease, uremia - No peaked T waves on 12-lead EKG noted, patient denies chest pain - Repeat electrolyte panel stat   ESRD (end stage renal disease) - Nephrology team notify, appreciate assistance   Transaminitis - Likely secondary to vascular congestion, repeat CMET in the morning   High anion gap metabolic acidosis - Secondary to end-stage kidney disease - gentle hydration, continue bicarb PO per home regimen - follow up on nephrology recommendations    Diabetes mellitus - on Glipizide at home, will hold here and place on SSI sensitive  coverage - check A1C    HTN (hypertension) - continue Norvasc and Metoprolol - holding lasix for now   Anemia  of chronic disease - secondary to ESRD, check anemia panel  - CBC in AM  Code Status: Full Family Communication: Pt and wife at bedside Disposition Plan: Admit to [facility] telemetry bed   Review of Systems:  Constitutional: Negative for fever, chills. Negative for diaphoresis.  HENT: Negative for hearing loss, ear pain, nosebleeds, congestion, sore throat, neck pain, tinnitus and ear discharge.   Eyes: Negative for blurred vision, double vision, photophobia, pain, discharge and redness.  Respiratory: Negative for cough, hemoptysis, sputum production, wheezing and stridor.   Cardiovascular: Negative for chest pain, palpitations, claudication.  Gastrointestinal: Negative for nausea, vomiting and abdominal pain. Negative for heartburn, constipation, blood in stool and melena.  Genitourinary: Negative for dysuria, urgency, frequency, hematuria and flank pain.  Musculoskeletal: Negative for myalgias, joint pain and falls.  Skin: Negative for itching and rash.  Neurological: Negative for tingling, tremors, sensory change, speech change, focal weakness, loss of consciousness and headaches.  Endo/Heme/Allergies: Negative for environmental allergies and polydipsia. Does not bruise/bleed easily.  Psychiatric/Behavioral: Negative for suicidal ideas. The patient is not nervous/anxious.      Past Medical History  Diagnosis Date  . Diabetes mellitus   . Hypertension   . GERD (gastroesophageal reflux disease)   . Fibromyalgia   . Chest pain   . Carotid artery occlusion   . Chronic kidney disease     - Stage 5- follwed by Dr Hyman Hopes  . Complication of anesthesia     agiation when he awaken  . Neuropathy   . Arthritis   . Sleep apnea   . H/O vitamin D deficiency   . Hearing loss   . Hypercholesteremia   . Myalgia and myositis   . 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  Cholecystectomy  1993  . Rotator cuff repair  07/06/2011    right  . Carotid endarterectomy Left   . Coolonoscopy    . Colonoscopy w/ biopsies and polypectomy      Hx: of  . Microlaryngoscopy with laser N/A 01/04/2013    Procedure: MICROLARYNGOSCOPY WITH BIOPSY/LASER;  Surgeon: Suzanna Obey, MD;  Location: [facility] OR;  Service: ENT;  Laterality: N/A;    Social History:  reports that he quit smoking about 16 years ago. His smoking use included Cigarettes. He has a 62 pack-year smoking history. He has never used smokeless tobacco. He reports that he does not drink alcohol or use illicit drugs.  Allergies  Allergen Reactions  . Amoxicillin     Causes upset stomach    Family History  Problem Relation Age of Onset  . Adopted: Yes    Medication Sig  amLODipine (NORVASC) 10 MG tablet Take 10 mg by mouth daily.  aspirin 81 MG tablet Take 81 mg by mouth daily.   calcium carbonate 1250 MG capsule Take 1,250 mg by mouth 2 (two) times daily with a meal.  doxazosin (CARDURA) 2 MG tablet Take 2 mg by mouth at bedtime.  esomeprazole (NEXIUM) 40 MG capsule Take 40 mg by mouth daily before breakfast.  fentaNYL  25 MCG/HR patch Place 1 patch onto the skin every 3 (three) days.   furosemide (LASIX) 40 MG tablet Take 40 mg by mouth daily.  glipiZIDE  2.5 MG 24 hr  tablet Take 2.5 mg by mouth daily.  magnesium oxide (MAG-OX) 400 MG tablet Take 400 mg by mouth daily.  metoprolol tartrate  25 MG tablet Take 25 mg by mouth 2 (two) times daily.   morphine  20 MG/ML concentrated solution Take 10-20 mg by mouth every 2 (two) hours as needed for pain.  ranitidine (ZANTAC) 300 MG capsule Take 300 mg by mouth at bedtime.  sodium bicarbonate 325 MG tablet Take 325 mg by mouth 3 (three) times daily.   traMADol (ULTRAM) 50 MG tablet Take 100 mg by mouth every 4 (four) hours as needed.   venlafaxine (EFFEXOR) 37.5 MG tablet Take 37.5 mg by mouth 2 (two) times daily.    Physical Exam: Filed Vitals:   02/10/13 0929  02/10/13 0932 02/10/13 0937  BP:   140/69  Pulse:   106  Resp:   20  SpO2: 90% 80% 93%   Physical Exam  Constitutional: Appears well-developed and well-nourished. In mild distress due to shortness of breath  HENT: Normocephalic. External right and left ear normal. Oropharynx is clear and moist.  Eyes: Conjunctivae and EOM are normal. PERRLA, no scleral icterus.  Neck: Normal ROM. Neck supple. No JVD. No tracheal deviation. No thyromegaly.  CVS: Regular rhythm, tachycardic, S1/S2 +, no murmurs, no gallops, no carotid bruit.  Pulmonary: Diminished breath sounds at bases, bibasilar crackles, no wheezing and no tachypnea Abdominal: Soft. BS +,  slightly distended, no tenderness, rebound or guarding.  Musculoskeletal: Normal range of motion. Trace bilateral lower extremity pitting edema and no tenderness.  Lymphadenopathy: No lymphadenopathy noted, cervical, inguinal. Neuro: Alert. Normal reflexes, muscle tone coordination. No cranial nerve deficit. Skin: Skin is warm and dry. No rash noted. Not diaphoretic. No erythema. No pallor.  Psychiatric: Normal mood and affect. Behavior, judgment, thought content normal.   Labs on Admission:  Basic Metabolic Panel:  Recent Labs Lab 02/10/13 1000  NA 135  K 7.0*  CL 97  CO2 16*  GLUCOSE 217*  BUN 101*  CREATININE 11.22*  CALCIUM 8.4   Liver Function Tests:  Recent Labs Lab 02/10/13 1000  AST 190*  ALT 155*  ALKPHOS 84  BILITOT 0.3  PROT 6.9  ALBUMIN 2.7*    Recent Labs Lab 02/10/13 1000  LIPASE 34   CBC:  Recent Labs Lab 02/10/13 1000  WBC 9.2  NEUTROABS 8.0*  HGB 11.5*  HCT 35.7*  MCV 90.8  PLT 211   Cardiac Enzymes: Radiological Exams on Admission: Dg Chest 2 View  02/10/2013  New bilateral infiltrates.  EKG: Normal sinus rhythm, no ST/T wave changes  Debbora Presto, MD  [facility] Pager [phone]  If 7PM-7AM, please contact night-coverage [email or web address] Password [facility] 02/10/2013, 12:26  PM

## 2013-02-10 NOTE — Progress Notes (Signed)
eLink Physician-Brief Progress Note Patient Name: Victor Lane DOB: [birth year] MRN: [id number]  Date of Service  02/10/2013   HPI/Events of Note  Pt with SVT HR 165, pt with ESRD, still volume excess  eICU Interventions  Try IV lasix, give IV lopressor and IV diltiazem    Intervention Category Major Interventions: Acute renal failure - evaluation and management;Arrhythmia - evaluation and management  Shan Levans 02/10/2013, 7:30 PM

## 2013-02-10 NOTE — Progress Notes (Signed)
ANTIBIOTIC CONSULT NOTE - INITIAL  Pharmacy Consult for Vancomycin, Aztreonam Indication: rule out pneumonia  Allergies  Allergen Reactions  . Amoxicillin     Causes upset stomach    Patient Measurements: Height: 5' 10.47" (179 cm) Weight: 194 lb 14.2 oz (88.4 kg) IBW/kg (Calculated) : 74.09  Vital Signs: Temp: 98.6 F (37 C) (08/22 1830) Temp src: Oral (08/22 1830) BP: 108/76 mmHg (08/22 1913) Pulse Rate: 165 (08/22 1913)  Labs:  Recent Labs  02/10/13 1000 02/10/13 1326 02/10/13 1819  WBC 9.2  --   --   HGB 11.5*  --   --   PLT 211  --   --   CREATININE 11.22* 10.44* 6.97*   Estimated Creatinine Clearance: 10.9 ml/min (by C-G formula based on Cr of 6.97). No results found for this basename: VANCOTROUGH, VANCOPEAK, VANCORANDOM, GENTTROUGH, GENTPEAK, GENTRANDOM, TOBRATROUGH, TOBRAPEAK, TOBRARND, AMIKACINPEAK, AMIKACINTROU, AMIKACIN,  in the last 72 hours   Microbiology: No results found for this or any previous visit (from the past 720 hour(s)).  Medical History: Past Medical History  Diagnosis Date  . Diabetes mellitus   . Hypertension   . GERD (gastroesophageal reflux disease)   . Fibromyalgia   . Chest pain   . Carotid artery occlusion   . Chronic kidney disease     - Stage 5- follwed by Dr Hyman Hopes  . Complication of anesthesia     agiation when he awaken  . Neuropathy   . Arthritis   . Sleep apnea   . H/O vitamin D deficiency   . Hearing loss   . Hypercholesteremia   . Myalgia and myositis   . Vocal cord cancer 01/04/13    Invasive Squamous Cell Carcinoma of the Right and Left Vocal Cords    Assessment: 67 year old male with stage 5 CKD -- impending HD, invasive squamous cell carcinoma of the vocal cords undergoing radiation who was transferred from [facility] for rt IJ cath placement for emergent HD.  Developed worsening dyspnea while on HD -- admitted to [facility] ICU.    Goal of Therapy:  Appropriate Vancomycin / Aztreonam dosing  Plan:  1) Aztreonam 1  Gram iv Q 24 hours 2) Vancomycin 1500 mg iv x 1 dose tonight 3) Follow up plans for HD, further dosing  Thank you. Okey Regal, PharmD   02/10/2013,7:29 PM

## 2013-02-10 NOTE — Progress Notes (Addendum)
Patient transferred from [facility] for emergent dialysis.  Cr 10 and K 7.  HR elevated but BP WNL.  Appreciate nephrology.  Cr in July appeared to be 7 up from baseline of about 3.     Opens eyes but not speaking.  Spoke with critical care who will see patient.  Await ABG.  Will transfer to ICU not SDU once patient is done with dialysis.  -if intubation needed, may be difficult due to h/o radiation to larynx   Marlin Canary DO

## 2013-02-10 NOTE — Progress Notes (Addendum)
Hemodialysis-See Flowsheet  Pt received 1 hour and 40 minutes of first hemodialysis treatment. Pt became tachy 170s sustained, bp dropped to 87/64. Dr. Kathrene Bongo on unit. Order to bolus with 500cc NS and recheck. HR remained tachy post bolus and order for Albumin 50g was given and patient was taken off HD, one more bolus of 500 ordered per MD. Rapid response was called to bedside. CBG 104. Pt is arousable however has been sleeping since arrival to unit. PT had recently had a chest xray on HD unit, ABG, and had orders to transfer to ICU instead of stepdown (as was originally ordered) post treatment. Total of 1300cc NS given on HD. Repeat BMET drawn and sent to check K.  Pt transferred to 2M09 accompanied by Rapid Response and RT. Family notified of change in status.

## 2013-02-10 NOTE — Progress Notes (Signed)
Hemodialysis- Pt received from IR post cath placement. Snoring heavily, sats 85-89 on NRB. RT called and order for Bipap and stat ABG was placed per Dr. Kathrene Bongo.

## 2013-02-10 NOTE — Consult Note (Addendum)
[facility] Renal Consultation Note  Requesting MD:  Izola Price Indication for Consultation:  Advanced CKD, need to start HD with hyperkalemia  HPI:  Victor Lane is a 67 y.o. male with PMhx significant for advanced CKD- seen Dr. Elvis Coil in the office, presumed diabetic nephropathy- he had been interested in peritoneal dialysis and had a visit for transplant at [facility]- last saw Dr. Hyman Hopes on 6/30 and was not uremic at that time.  Creatinine was 7.64 at that time.  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  His other PMhx includes HTN, OSA, hypercholesterolemia, s/p CEA.  Pt is brought to the ER by his wife with weakness, confusion, SOB and decreased UOP.  Labs of note in the ER include BUN and creatinine of 101 and 11, K of 7.0 (given IV calc/IV bicarb in ER- no peaked t waves) Pt is not hypotensive so does not appear to be volume depleted- probably just has progression of his CKD and needs to start HD treatments.  We will need to do semiemergent HD  I have spoken to IR to place tunneled hemodialysis cath today and have repeated IV bicarb and also given insulin and d50 for his high K.  Seen after PC placement in dialysis- he is sedated- shallow breathing with lowish 02 sats- we are getting his CPAP.  Also has an elevated lactate at 3.82   Creatinine, Ser  Date/Time Value Range Status  02/10/2013 10:00 AM 11.22* 0.50 - 1.35 mg/dL Final  4/54/0981 19:14 AM 7.82* 0.50 - 1.35 mg/dL Final  78/29/5621  3:08 AM 3.19* 0.50 - 1.35 mg/dL Final  65/78/4696  2:95 AM 4.06* 0.50 - 1.35 mg/dL Final  28/09/1322 40:10 AM 3.09* 0.50 - 1.35 mg/dL Final     PMHx:   Past Medical History  Diagnosis Date  . Diabetes mellitus   . Hypertension   . GERD (gastroesophageal reflux disease)   . Fibromyalgia   . Chest pain   . Carotid artery occlusion   . Chronic kidney disease     - Stage 5- follwed by Dr Hyman Hopes  .  Complication of anesthesia     agiation when he awaken  . Neuropathy   . Arthritis   . Sleep apnea   . H/O vitamin D deficiency   . Hearing loss   . Hypercholesteremia   . Myalgia and myositis   . 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 Cholecystectomy  1993  . Rotator cuff repair  07/06/2011    right  . Carotid endarterectomy Left   . Coolonoscopy    . Colonoscopy w/ biopsies and polypectomy      Hx: of  . Microlaryngoscopy with laser N/A 01/04/2013    Procedure: MICROLARYNGOSCOPY WITH BIOPSY/LASER;  Surgeon: Suzanna Obey, MD;  Location: [facility] OR;  Service: ENT;  Laterality: N/A;    Family Hx:  Family History  Problem Relation Age of Onset  . Adopted: Yes    Social History:  reports that he quit smoking about 16 years ago. His smoking use included Cigarettes. He has a 62 pack-year smoking history. He has never used smokeless tobacco. He reports that he does not drink alcohol or use illicit drugs.  Allergies:  Allergies  Allergen Reactions  . Amoxicillin     Causes upset stomach  Medications: Prior to Admission medications   Medication Sig Start Date End Date Taking? Authorizing Provider  Alum & Mag Hydroxide-Simeth (MAGIC MOUTHWASH W/LIDOCAINE) SOLN 1part nystatin,1part Maaloxplus,1part benadryl,3part 2%viscous lidocaine. Swallow 10 mL up to QID, before meals/bedtime 02/06/13  Yes Lonie Peak, MD  amLODipine (NORVASC) 10 MG tablet Take 10 mg by mouth daily.   Yes Historical Provider, MD  aspirin 81 MG tablet Take 81 mg by mouth daily.    Yes Historical Provider, MD  calcium carbonate 1250 MG capsule Take 1,250 mg by mouth 2 (two) times daily with a meal.   Yes Historical Provider, MD  doxazosin (CARDURA) 2 MG tablet Take 2 mg by mouth at bedtime.   Yes Historical Provider, MD  esomeprazole (NEXIUM) 40 MG capsule Take 40 mg by mouth daily before breakfast.    Yes Historical Provider, MD  fentaNYL (DURAGESIC - DOSED MCG/HR) 25 MCG/HR patch Place 1 patch onto the skin every 3 (three) days. Last patch was applied 8/19 at 1600 02/07/13  Yes Historical Provider, MD  furosemide (LASIX) 40 MG tablet Take 40 mg by mouth daily.   Yes Historical Provider, MD  glipiZIDE (GLUCOTROL XL) 2.5 MG 24 hr tablet Take 2.5 mg by mouth daily.   Yes Historical Provider, MD  magnesium oxide (MAG-OX) 400 MG tablet Take 400 mg by mouth daily.   Yes Historical Provider, MD  metoprolol tartrate (LOPRESSOR) 25 MG tablet Take 25 mg by mouth 2 (two) times daily.    Yes Historical Provider, MD  morphine (ROXANOL) 20 MG/ML concentrated solution Take 10-20 mg by mouth every 2 (two) hours as needed for pain. 02/08/13  Yes Maryln Gottron, MD  Omega-3 Fatty Acids (FISH OIL) 1200 MG CPDR Take 2,400 mg by mouth.   Yes Historical Provider, MD  ranitidine (ZANTAC) 300 MG capsule Take 300 mg by mouth at bedtime.   Yes Historical Provider, MD  sodium bicarbonate 325 MG tablet Take 325 mg by mouth 3 (three) times daily.    Yes Historical Provider, MD  traMADol (ULTRAM) 50 MG tablet Take 100 mg by mouth every 4 (four) hours as needed.    Yes Historical Provider, MD  venlafaxine (EFFEXOR) 37.5 MG tablet Take 37.5 mg by mouth 2 (two) times daily.   Yes Historical Provider, MD  Vitamin D, Ergocalciferol, (DRISDOL) 50000 UNITS CAPS Take 50,000 Units by mouth every 14 (fourteen) days. Next dose is due august 30th 02/05/13  Yes Historical Provider, MD  b complex vitamins capsule Take 1 capsule by mouth daily.    Historical Provider, MD    I have reviewed the patient's current medications.  Labs:  Results for orders placed during the hospital encounter of 02/10/13 (from the past 48 hour(s))  CBC WITH DIFFERENTIAL     Status: Abnormal   Collection Time    02/10/13 10:00 AM      Result Value Range   WBC 9.2  4.0 - 10.5 K/uL   RBC 3.93 (*) 4.22 - 5.81 MIL/uL   Hemoglobin 11.5 (*) 13.0 - 17.0 g/dL    HCT 19.1 (*) 47.8 - 52.0 %   MCV 90.8  78.0 - 100.0 fL   MCH 29.3  26.0 - 34.0 pg   MCHC 32.2  30.0 - 36.0 g/dL   RDW 29.5  62.1 - 30.8 %   Platelets 211  150 - 400 K/uL   Neutrophils Relative % 86 (*) 43 - 77 %   Lymphocytes Relative 7 (*) 12 - 46 %   Monocytes  Relative 7  3 - 12 %   Eosinophils Relative 0  0 - 5 %   Basophils Relative 0  0 - 1 %   Neutro Abs 8.0 (*) 1.7 - 7.7 K/uL   Lymphs Abs 0.6 (*) 0.7 - 4.0 K/uL   Monocytes Absolute 0.6  0.1 - 1.0 K/uL   Eosinophils Absolute 0.0  0.0 - 0.7 K/uL   Basophils Absolute 0.0  0.0 - 0.1 K/uL   WBC Morphology INCREASED BANDS (>20% BANDS)    COMPREHENSIVE METABOLIC PANEL     Status: Abnormal   Collection Time    02/10/13 10:00 AM      Result Value Range   Sodium 135  135 - 145 mEq/L   Comment: REPEATED TO VERIFY   Potassium 7.0 (*) 3.5 - 5.1 mEq/L   Comment: REPEATED TO VERIFY     NO VISIBLE HEMOLYSIS     CRITICAL RESULT CALLED TO, READ BACK BY AND VERIFIED WITH:     BINGHAM,S. RN@1110  02/10/13 HAMER,N.   Chloride 97  96 - 112 mEq/L   Comment: REPEATED TO VERIFY   CO2 16 (*) 19 - 32 mEq/L   Comment: REPEATED TO VERIFY   Glucose, Bld 217 (*) 70 - 99 mg/dL   BUN 161 (*) 6 - 23 mg/dL   Creatinine, Ser 09.60 (*) 0.50 - 1.35 mg/dL   Calcium 8.4  8.4 - 45.4 mg/dL   Total Protein 6.9  6.0 - 8.3 g/dL   Albumin 2.7 (*) 3.5 - 5.2 g/dL   AST 098 (*) 0 - 37 U/L   ALT 155 (*) 0 - 53 U/L   Alkaline Phosphatase 84  39 - 117 U/L   Total Bilirubin 0.3  0.3 - 1.2 mg/dL   GFR calc non Af Amer 4 (*) >90 mL/min   GFR calc Af Amer 5 (*) >90 mL/min   Comment: (NOTE)     The eGFR has been calculated using the CKD EPI equation.     This calculation has not been validated in all clinical situations.     eGFR's persistently <90 mL/min signify possible Chronic Kidney     Disease.  LIPASE, BLOOD     Status: None   Collection Time    02/10/13 10:00 AM      Result Value Range   Lipase 34  11 - 59 U/L  CG4 I-STAT (LACTIC ACID)     Status: Abnormal    Collection Time    02/10/13 10:12 AM      Result Value Range   Lactic Acid, Venous 3.82 (*) 0.5 - 2.2 mmol/L  RETICULOCYTES     Status: Abnormal   Collection Time    02/10/13  1:26 PM      Result Value Range   Retic Ct Pct 1.6  0.4 - 3.1 %   RBC. 3.71 (*) 4.22 - 5.81 MIL/uL   Retic Count, Manual 59.4  19.0 - 186.0 K/uL  GLUCOSE, CAPILLARY     Status: Abnormal   Collection Time    02/10/13  1:27 PM      Result Value Range   Glucose-Capillary 196 (*) 70 - 99 mg/dL     ROS:   Pertinent items are noted in HPI.  ROS really unable to be obtained secondary to pts mental status. Wife and son at bedside reiterate a several day history of weakness- and decreased MS- poor appetitie  Physical Exam: Filed Vitals:   02/10/13 1330  BP: 128/59  Pulse: 110  Resp:  20     General: obese WM, currently very sedated from IR - audible stridor shallow breaths HEENT: PERRLA, EOMI- mucous membranes dry Neck: no JVD Heart: tachy Lungs: poor effort- stridor-  Abdomen: soft, non tender Extremities: no peripheral edema Skin: warm and dry Neuro: sedated, not very responsive  Assessment/Plan: 67 year old male with multiple medical problems to include advanced renal insufficiency- now with hyperkalemia and uremia necessitating a semi emergent start to HD 1.Renal- advanced renal insufficiency at baseline, now uremic and hyperkalemic necessitating start to HD.  I  have spoken with IR to get PC so can start dialysis tonight.  He may have some volume depletion by exam and that may have something to do with his numbers, will run even on HD and hydrate gently after.  I anticipate that he will now be HD dependant from here on and have told family that.  But, will check ultrasound to look for reversibility and follow UOP- place foley  2. Hypertension/volume  - seems dry- will hold lasix and other antihypertensives and hydrate gently 3. Hyperkalemia- no peaked T's.... Will redose bicarb and also give insulin and  D50- no kaexylate because needs to be NPO for his PC- will be able to get K down with HD tonight- follow afterwards 4. Anemia  - not a significant issue as of yet, anticipate will decrease with hydration- will follow CBC and treat as needed 5. Respiratory status- a little tenuous right now, borderline sats- placed on bipap- I anticipate the dialysis will help with clearing of narcotics and uremia which may help situation. I also am not sure how this vocal cord CA and radiation will impact things 6. Decreased MS- uremia- narcotics with minimal clearance- hopefully dialysis will help 7. Bilateral pulm infiltrates- Not sure if this is poor effort or infection.  Did get doses of antibiotics in the ER.  I dont think it is fluid    Victor Lane A 02/10/2013, 1:40 PM

## 2013-02-10 NOTE — Procedures (Signed)
Patient was seen on dialysis and the procedure was supervised.  BFR 200  Via PC BP is  120/85.   Patient seen- first treatment ever- uremic and hyperkalemia and also with some resp distress  Aby Gessel A 02/10/2013

## 2013-02-10 NOTE — ED Notes (Signed)
Critical lab value potassium 6.9

## 2013-02-10 NOTE — Significant Event (Signed)
Rapid Response Event Note  Overview:  Called to HD to see patient with rapid HR Time Called: 1801 Arrival Time: 1810 Event Type: Cardiac  Initial Focused Assessment:  On arrival patient supine in bed with BiPap on - just finishing rinsing from HD treatment - had 1 hour 40 mins of a two hour scheduled treatment - RN Belenda Cruise and Dr. Kathrene Bongo at bedside.  Per report patient transferred from [facility] today for HD cath insertion and HD treatment for hypokalemia.  On arrival to HD was hypoxic with rapid resps - Dr Vassie Loll saw and placed patient on BiPap.  RN Belenda Cruise reports HD uneventful until became tachycardic.  Had 50 mcg Fentanyl and 1 mg Versed in IR with cath placement and has been sleepy during treatment.  Currently will arouse to name - denies CP or SOB when questioned several times.  Follows simple commands.  Skin hot and moist.  Bil BS with Rhonchi noted all fields - NAD.  HR tachy and regular. BP 105/56 HR 154 RR 20 O2 sats 97%.  RN Belenda Cruise reports patient has received 1.5 liters of NS during HD case for BP as well as some albumin.   Interventions:  Stat 12 lead - SVT - Dr. Kathrene Bongo reviewing.  Spoke with Dr. Delford Field in [facility] - orders to minimize fluids, stat BMET sent (drawn by HD RN) and stat transfer to ICU.  Patient transferred to 2M09 via bed with Bipap - RT - Heart monitor - tol transfer.  On arrival able to open eyes briefly - clearly nods yes and no to questions.  Denies CP or SOB.  [facility] notified of arrival.  Hand off to [employer].  RN Krisitin to call and update family.    Event Summary: Name of Physician Notified: Dr. Kathrene Bongo at  (at bedside)  Name of Consulting Physician Notified: Dr. Delford Field at [phone]  Outcome: Transferred (Comment)  Event End Time: 1840  [place]

## 2013-02-10 NOTE — ED Notes (Signed)
Advised patient need urine sample. Will re-attempt when back from Xray

## 2013-02-10 NOTE — ED Notes (Signed)
Per ems: pt has hx of stage 5 kidney cancer, not on dialysis. Was recently prescribed fentanyl patch, wife states he seemed incoherent last night and took her a long time to wake him up this morning. Patch was removed 12 hours ago. Pt has been nauseated and vomited once this morning at 0800. Wife reports vomit appeared to have blood in it. Pt appears pale and short of breath. Has not been drinking fluids. Pt also reports numbness in bilateral hands, left hand worse than right. bp 118/82, pulse 113, EKG unremarkable. saO2 90%, cbg 205

## 2013-02-10 NOTE — ED Notes (Signed)
Patient transported to X-ray 

## 2013-02-10 NOTE — Procedures (Signed)
R IJ HemoSplit HD catheter placed No complication No blood loss. See complete dictation in [facility].

## 2013-02-10 NOTE — ED Notes (Addendum)
[facility] brought pt straight to [facility] IR, called and gave RN report. Pt will then be going to [facility] stepdown. Pt still needs 8 units of novolog (2 units given prior d/t wrong order placed) MD also requesting pt to have second dose of sodium bicarb 50 mEq and dextrose 50% 25 mL.

## 2013-02-11 DIAGNOSIS — E875 Hyperkalemia: Secondary | ICD-10-CM

## 2013-02-11 DIAGNOSIS — N19 Unspecified kidney failure: Secondary | ICD-10-CM

## 2013-02-11 DIAGNOSIS — R4182 Altered mental status, unspecified: Secondary | ICD-10-CM

## 2013-02-11 DIAGNOSIS — R0902 Hypoxemia: Secondary | ICD-10-CM

## 2013-02-11 LAB — GLUCOSE, CAPILLARY
Glucose-Capillary: 120 mg/dL — ABNORMAL HIGH (ref 70–99)
Glucose-Capillary: 107 mg/dL — ABNORMAL HIGH (ref 70–99)
Glucose-Capillary: 103 mg/dL — ABNORMAL HIGH (ref 70–99)
Glucose-Capillary: 115 mg/dL — ABNORMAL HIGH (ref 70–99)

## 2013-02-11 LAB — CBC
HCT: 28.4 % — ABNORMAL LOW (ref 39.0–52.0)
Hemoglobin: 9.4 g/dL — ABNORMAL LOW (ref 13.0–17.0)
RBC: 3.2 MIL/uL — ABNORMAL LOW (ref 4.22–5.81)
WBC: 6 10*3/uL (ref 4.0–10.5)

## 2013-02-11 LAB — COMPREHENSIVE METABOLIC PANEL
ALT: 94 U/L — ABNORMAL HIGH (ref 0–53)
BUN: 88 mg/dL — ABNORMAL HIGH (ref 6–23)
CO2: 20 mEq/L (ref 19–32)
Calcium: 8.3 mg/dL — ABNORMAL LOW (ref 8.4–10.5)
Creatinine, Ser: 9.27 mg/dL — ABNORMAL HIGH (ref 0.50–1.35)
GFR calc Af Amer: 6 mL/min — ABNORMAL LOW (ref >90)
GFR calc non Af Amer: 5 mL/min — ABNORMAL LOW (ref >90)
Glucose, Bld: 126 mg/dL — ABNORMAL HIGH (ref 70–99)
Sodium: 142 mEq/L (ref 135–145)

## 2013-02-11 LAB — LEGIONELLA ANTIGEN, URINE: Legionella Antigen, Urine: NEGATIVE

## 2013-02-11 MED ORDER — CEFEPIME HCL 1 G IJ SOLR
1.0000 g | INTRAMUSCULAR | Status: DC
  Administered 2013-02-11 – 2013-02-14 (×4): 1 g via INTRAVENOUS
  Filled 2013-02-11 (×5): qty 1

## 2013-02-11 MED ORDER — SODIUM CHLORIDE 0.9 % IV SOLN
500.0000 mg | Freq: Once | INTRAVENOUS | Status: AC
  Administered 2013-02-11: 500 mg via INTRAVENOUS
  Filled 2013-02-11: qty 500

## 2013-02-11 MED ORDER — DARBEPOETIN ALFA-POLYSORBATE 100 MCG/0.5ML IJ SOLN
100.0000 ug | INTRAMUSCULAR | Status: DC
  Administered 2013-02-11: 100 ug via INTRAVENOUS
  Filled 2013-02-11 (×3): qty 0.5

## 2013-02-11 MED ORDER — AZTREONAM 1 G IJ SOLR
1.0000 g | INTRAMUSCULAR | Status: DC
  Filled 2013-02-11: qty 1

## 2013-02-11 NOTE — Progress Notes (Signed)
Victor Bury, MD of patient HR of 150s while on Cardizem drip at 15mg /hr.    I: order for Lopressor 5mg  IV given  Nicole Cella, RN

## 2013-02-11 NOTE — Progress Notes (Signed)
ANTIBIOTIC CONSULT NOTE - FOLLOW UP  Pharmacy Consult for Vancomycin + Cefepime Indication: rule out pneumonia  Allergies  Allergen Reactions  . Amoxicillin     Causes upset stomach    Patient Measurements: Height: 5' 10.47" (179 cm) Weight: 194 lb 7.1 oz (88.2 kg) IBW/kg (Calculated) : 74.09  Vital Signs: Temp: 98 F (36.7 C) (08/23 0738) Temp src: Oral (08/23 0738) BP: 113/67 mmHg (08/23 0700) Pulse Rate: 84 (08/23 0700) Intake/Output from previous day: 08/22 0701 - 08/23 0700 In: 1700.9 [I.V.:1600.9; IV Piggyback:100] Out: -855 [Urine:475] Intake/Output from this shift:    Labs:  Recent Labs  02/10/13 1000 02/10/13 1326 02/10/13 1819 02/11/13 0520  WBC 9.2  --   --  6.0  HGB 11.5*  --   --  9.4*  PLT 211  --   --  153  CREATININE 11.22* 10.44* 6.97* 9.27*   Estimated Creatinine Clearance: 8.1 ml/min (by C-G formula based on Cr of 9.27). No results found for this basename: VANCOTROUGH, Leodis Binet, VANCORANDOM, GENTTROUGH, GENTPEAK, GENTRANDOM, TOBRATROUGH, TOBRAPEAK, TOBRARND, AMIKACINPEAK, AMIKACINTROU, AMIKACIN,  in the last 72 hours   Microbiology: Recent Results (from the past 720 hour(s))  MRSA PCR SCREENING     Status: None   Collection Time    02/10/13  6:44 PM      Result Value Range Status   MRSA by PCR NEGATIVE  NEGATIVE Final   Comment:            The GeneXpert MRSA Assay (FDA     approved for NASAL specimens     only), is one component of a     comprehensive MRSA colonization     surveillance program. It is not     intended to diagnose MRSA     infection nor to guide or     monitor treatment for     MRSA infections.    Anti-infectives   Start     Dose/Rate Route Frequency Ordered Stop   02/10/13 2200  aztreonam (AZACTAM) 2 g in dextrose 5 % 50 mL IVPB  Status:  Discontinued     2 g 100 mL/hr over 30 Minutes Intravenous 3 times per day 02/10/13 1853 02/10/13 1928   02/10/13 2000  aztreonam (AZACTAM) 2 g in dextrose 5 % 50 mL IVPB     2  g 100 mL/hr over 30 Minutes Intravenous Every 24 hours 02/10/13 1928 02/18/13 1959   02/10/13 1945  vancomycin (VANCOCIN) 1,500 mg in sodium chloride 0.9 % 500 mL IVPB     1,500 mg 250 mL/hr over 120 Minutes Intravenous  Once 02/10/13 1937 02/10/13 2206   02/10/13 1900  ceFAZolin (ANCEF) IVPB 2 g/50 mL premix     2 g 100 mL/hr over 30 Minutes Intravenous On call 02/10/13 1853 02/10/13 2012   02/10/13 1500  vancomycin (VANCOCIN) IVPB 1000 mg/200 mL premix  Status:  Discontinued     1,000 mg 200 mL/hr over 60 Minutes Intravenous On call 02/10/13 1458 02/10/13 1501   02/10/13 1100  cefTRIAXone (ROCEPHIN) 1 g in dextrose 5 % 50 mL IVPB     1 g 100 mL/hr over 30 Minutes Intravenous  Once 02/10/13 1057 02/10/13 1159   02/10/13 1100  azithromycin (ZITHROMAX) 500 mg in dextrose 5 % 250 mL IVPB     500 mg 250 mL/hr over 60 Minutes Intravenous  Once 02/10/13 1057 02/10/13 1348     Medications: Vancomycin 1.5 g @ 2000 on 8/22 Azactam 2g @ 2200 on 8/22  Assessment: [name] who was started on Vancomycin + Aztreonam on 8/22 in the [facility] for r/o PNA in the setting of SOB and possible infiltrates on CXR. Spoke with Canary Brim today regarding the patient's "allergy to amoxicillin" as only an intolerance of "stomach upset". Will d/c Azactam and change to patient to Cefepime for gram negative coverage.  The patient was noted to require emergent HD on admission -- however only tolerated ~1 hr at BFR of 200 cc/min on 8/22. The patient received HD again today and tolerated 3 hr at BFR 250 ml/hr. Given this, will go ahead and give a low-dose Vancomycin replacement this afternoon. Will plan to check a random Vancomycin level on 8/24 to determine appropriateness.    Goal of Therapy:  Pre-HD Vancomycin level of 15-25 mcg/ml Proper antibiotics for infection/cultures adjusted for renal/hepatic function   Plan:  1. D/c Azactam and start Cefepime 1g IV every 24 hours (in the evening to be given after HD  sessions) 2. Vanc 500 mg IV x 1 dose this afternoon 3. Will continue to follow HD schedule/duration, culture results, LOT, and antibiotic de-escalation plans   Georgina Pillion, PharmD, BCPS Clinical Pharmacist Pager: [phone] 02/11/2013 10:52 AM

## 2013-02-11 NOTE — Progress Notes (Signed)
Subjective:  Events noted- pt relatively stable overnight- HR now down- dilt being weaned- did have close to 500 of UOP with lasix- bipap also been weaned to mask with good sats- having loose stools.    Objective Vital signs in last 24 hours: Filed Vitals:   02/11/13 0452 02/11/13 0500 02/11/13 0525 02/11/13 0600  BP:  90/78  122/69  Pulse:  79 79 84  Temp:      TempSrc:      Resp:    13  Height:      Weight: 88.2 kg (194 lb 7.1 oz)     SpO2:  94% 91% 95%   Weight change:   Intake/Output Summary (Last 24 hours) at 02/11/13 0703 Last data filed at 02/11/13 0600  Gross per 24 hour  Intake 1695.92 ml  Output   -875 ml  Net 2570.92 ml    Assessment/Plan: 67 year old male with multiple medical problems to include advanced renal insufficiency- now presents with hyperkalemia and uremia necessitating a semi emergent start to HD  1.Renal- advanced renal insufficiency at baseline, now uremic and hyperkalemic necessitating start to HD. He did not tolerate his first treatment the best with tachycardia and hypotension even though I was not removing fluid. Plan for second HD treatment today - will do in the ICU.  I anticipate that he will now be HD dependant from here on and have told family that. But, will check ultrasound to look for reversibility (pending)  and follow UOP 2. Hypertension/volume - initially seemed dry to me- holding lasix and other antihypertensives and hydrating gently at 50 cc per hour but now that HR is down will stop IVF- but run even with HD 3. Hyperkalemia- better with HD, has rebounded some this AM- for HD 4. Anemia - hgb of 9 more in line with this clinical picture- will start ESA - no iron due to increased ferritin 5. Respiratory status- a little tenuous right now but better, bipap weaned- I anticipate the dialysis will help with clearing of narcotics and uremia which may help situation. I also am not sure how this vocal cord CA and radiation will impact things.  I think  the infiltrates are more likely infectious than fluid- on ABX per CCM for PNA (? Aspiration? With history of not being able to swallow) 6. Decreased MS- uremia- narcotics with minimal clearance- hopefully serial dialysis will help  7. Bilateral pulm infiltrates- Not sure if this is poor effort or infection. Did get doses of antibiotics in the ER. I dont think it is fluid      Kamaljit Hizer A    Labs: Basic Metabolic Panel:  Recent Labs Lab 02/10/13 1326 02/10/13 1800 02/10/13 1819 02/11/13 0520  NA 137  --  142 142  K 6.9*  --  4.5 5.3*  CL 103  --  105 106  CO2 17*  --  22 20  GLUCOSE 217*  --  116* 126*  BUN 100*  --  67* 88*  CREATININE 10.44*  --  6.97* 9.27*  CALCIUM 8.1*  --  8.0* 8.3*  PHOS  --  8.4*  --   --    Liver Function Tests:  Recent Labs Lab 02/10/13 1000 02/11/13 0520  AST 190* 194*  ALT 155* 94*  ALKPHOS 84 54  BILITOT 0.3 0.2*  PROT 6.9 5.4*  ALBUMIN 2.7* 2.2*    Recent Labs Lab 02/10/13 1000  LIPASE 34   No results found for this basename: AMMONIA,  in  the last 168 hours CBC:  Recent Labs Lab 02/10/13 1000 02/11/13 0520  WBC 9.2 6.0  NEUTROABS 8.0*  --   HGB 11.5* 9.4*  HCT 35.7* 28.4*  MCV 90.8 88.8  PLT 211 153   Cardiac Enzymes: No results found for this basename: CKTOTAL, CKMB, CKMBINDEX, TROPONINI,  in the last 168 hours CBG:  Recent Labs Lab 02/10/13 1807 02/10/13 1837 02/10/13 2020 02/11/13 0029 02/11/13 0400  GLUCAP 104* 116* 149* 146* 114*    Iron Studies:  Recent Labs  02/10/13 1326  IRON 17*  TIBC 160*  FERRITIN 1264*   Studies/Results: Dg Chest 2 View  02/10/2013   *RADIOLOGY REPORT*  Clinical Data: Tachycardia  CHEST - 2 VIEW  Comparison: 01/04/2013  Findings: Cardiac shadow is stable.  New bilateral lung infiltrates are seen within the lingula and right middle lobe as well as the left lower lobe.  No sizable effusion is seen.  No acute bony abnormality is noted.  IMPRESSION: New bilateral  infiltrates.   Original Report Authenticated By: Alcide Clever, M.D.   Ir Fluoro Guide Cv Line Right  02/10/2013   *RADIOLOGY REPORT*  Clinical data: Acute worsening of renal insufficiency.  Needs access for hemodialysis.  TUNNELED HEMODIALYSIS CATHETER PLACEMENT WITH ULTRASOUND AND FLUOROSCOPIC GUIDANCE  Comparison: None available  Technique: The procedure, risks, benefits, and alternatives were explained to the patient.  Questions regarding the procedure were encouraged and answered.  The patient understands and consents to the procedure.  As antibiotic prophylaxis, cefazolin 1 grain was ordered pre- procedure and administered intravenously within one hour of incision.  Patency of the right IJ vein was confirmed with ultrasound with image documentation. An appropriate skin site was determined. Region was prepped using maximum barrier technique including cap and mask, sterile gown, sterile gloves, large sterile sheet, and Chlorhexidine   as cutaneous antisepsis. The region was infiltrated locally with 1% lidocaine.  Intravenous Fentanyl and Versed were administered as conscious sedation during continuous cardiorespiratory monitoring by the radiology RN, with a total moderate sedation time of nine minutes.  Under real-time ultrasound guidance, the right IJ vein was accessed with a 21 gauge micropuncture needle; the needle tip within the vein was confirmed with ultrasound image documentation.   Needle exchanged over the 018 guidewire for transitional dilator, which allowed advancement of a Benson wire into the IVC. Over this, an MPA catheter was advanced. A Hemosplit 19 hemodialysis catheter was tunneled from the right anterior chest wall approach to the right IJ dermatotomy site. The MPA catheter was exchanged over an Amplatz wire for serial vascular dilators which allow placement of a peel- away sheath, through which the catheter was advanced under intermittent fluoroscopy, positioned with its tips in the proximal  and midright atrium. Spot chest radiograph confirms good catheter position. No pneumothorax. Catheter was flushed and primed per protocol. Catheter secured externally with O Prolene sutures. The right IJ   dermatotomy site was closed with  Dermabond. No immediate complication.  Fluoroscopy time: 15 seconds  IMPRESSION:  1. Technically successful placement of tunneled right IJ hemodialysis catheter with ultrasound and fluoroscopic guidance. Ready for routine use.   Original Report Authenticated By: D. Andria Rhein, MD   Ir US Guide Vasc Access Right  02/10/2013   *RADIOLOGY REPORT*  Clinical data: Acute worsening of renal insufficiency.  Needs access for hemodialysis.  TUNNELED HEMODIALYSIS CATHETER PLACEMENT WITH ULTRASOUND AND FLUOROSCOPIC GUIDANCE  Comparison: None available  Technique: The procedure, risks, benefits, and alternatives were explained to the patient.  Questions regarding the procedure were encouraged and answered.  The patient understands and consents to the procedure.  As antibiotic prophylaxis, cefazolin 1 grain was ordered pre- procedure and administered intravenously within one hour of incision.  Patency of the right IJ vein was confirmed with ultrasound with image documentation. An appropriate skin site was determined. Region was prepped using maximum barrier technique including cap and mask, sterile gown, sterile gloves, large sterile sheet, and Chlorhexidine   as cutaneous antisepsis. The region was infiltrated locally with 1% lidocaine.  Intravenous Fentanyl and Versed were administered as conscious sedation during continuous cardiorespiratory monitoring by the radiology RN, with a total moderate sedation time of nine minutes.  Under real-time ultrasound guidance, the right IJ vein was accessed with a 21 gauge micropuncture needle; the needle tip within the vein was confirmed with ultrasound image documentation.   Needle exchanged over the 018 guidewire for transitional dilator, which  allowed advancement of a Benson wire into the IVC. Over this, an MPA catheter was advanced. A Hemosplit 19 hemodialysis catheter was tunneled from the right anterior chest wall approach to the right IJ dermatotomy site. The MPA catheter was exchanged over an Amplatz wire for serial vascular dilators which allow placement of a peel- away sheath, through which the catheter was advanced under intermittent fluoroscopy, positioned with its tips in the proximal and midright atrium. Spot chest radiograph confirms good catheter position. No pneumothorax. Catheter was flushed and primed per protocol. Catheter secured externally with O Prolene sutures. The right IJ   dermatotomy site was closed with  Dermabond. No immediate complication.  Fluoroscopy time: 15 seconds  IMPRESSION:  1. Technically successful placement of tunneled right IJ hemodialysis catheter with ultrasound and fluoroscopic guidance. Ready for routine use.   Original Report Authenticated By: D. Andria Rhein, MD   Dg Chest Port 1 View  02/10/2013   CLINICAL DATA:  Status post central line insertion.  EXAM: PORTABLE CHEST - 1 VIEW  COMPARISON:  Earlier today.  FINDINGS: Interval right jugular double-lumen catheter with its tip in the superior vena cava. No pneumothorax. Increased right basilar atelectasis. Decreased bilateral patchy airspace opacity. Normal sized heart. Thoracic spine degenerative changes.  IMPRESSION: 1. Right jugular port catheter tip in the superior vena cava without pneumothorax.  2.  Increased right basal atelectasis.  3. Mildly decreased bilateral pneumonia or alveolar edema.   Electronically Signed   By: Gordan Payment   On: 02/10/2013 17:38   Medications: Infusions: . sodium chloride 50 mL/hr at 02/10/13 1257  . sodium chloride 50 mL/hr at 02/10/13 1700  . diltiazem (CARDIZEM) infusion 5 mg/hr (02/11/13 0445)    Scheduled Medications: . aspirin EC  81 mg Oral Daily  . aztreonam  2 g Intravenous Q24H  . calcium carbonate  1  tablet Oral BID WC  . dextrose  0.5 ampule Intravenous Once  . famotidine  20 mg Oral QHS  . fentaNYL  25 mcg Transdermal Q72H  . heparin  5,000 Units Subcutaneous Q8H  . insulin aspart  0-9 Units Subcutaneous Q4H  . insulin aspart  10 Units Intravenous Once  . magnesium oxide  400 mg Oral Daily  . metoprolol tartrate  25 mg Oral BID  . pantoprazole  40 mg Oral Daily  . sodium bicarbonate  50 mEq Intravenous Once  . sodium bicarbonate  325 mg Oral TID  . sodium chloride  3 mL Intravenous Q12H  . venlafaxine  37.5 mg Oral BID    have reviewed scheduled and prn  medications.  Physical Exam: General: somnolent but arousable Heart: RRR Lungs: poor effort, crackles Abdomen: obese, distended Extremities: no edema Dialysis Access: PC placed by IR on 8/22    02/11/2013,7:03 AM  LOS: 1 day

## 2013-02-11 NOTE — Progress Notes (Signed)
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 Jearld Fenton) ,undergoing RT [name]) adm to [facility] 8/22 with confusion, dyspnea, hypoxia, hyperkalemia & AKI with cr 11/ BUN 101 - transferred to [facility] for rt IJ permacath placement then , emergent HD. PCCM consulted 8/22 pm for worsening dyspnea while on HD requiring bipap. CXR shows BL nodular infiltrates ? pna vs fluid   SIGNIFICANT EVENTS / STUDIES:  01/04/13 microlaryngoscopy - Jearld Fenton  LINES / TUBES: RIJ permacath (IR) 8/22 >>  CULTURES: resp c/s 8/22 BCx2 8/22   ANTIBIOTICS: ceftx 8/22>> azithro 8/22 >>  SUBJECTIVE:  Pt still dyspneic on high flow oxygen  VITAL SIGNS: Temp:  [98 F (36.7 C)-98.6 F (37 C)] 98 F (36.7 C) (08/23 0738) Pulse Rate:  [79-174] 84 (08/23 0700) Resp:  [11-28] 14 (08/23 0700) BP: (78-160)/(49-102) 113/67 mmHg (08/23 0700) SpO2:  [80 %-100 %] 95 % (08/23 0700) FiO2 (%):  [50 %-100 %] 50 % (08/23 0400) Weight:  [86.3 kg (190 lb 4.1 oz)-88.4 kg (194 lb 14.2 oz)] 88.2 kg (194 lb 7.1 oz) (08/23 0452) HEMODYNAMICS: CV stable   VENTILATOR SETTINGS: Vent Mode:  [-]  FiO2 (%):  [50 %-100 %] 50 % INTAKE / OUTPUT: Intake/Output     08/22 0701 - 08/23 0700 08/23 0701 - 08/24 0700   I.V. (mL/kg) 1600.9 (18.2)    IV Piggyback 100    Total Intake(mL/kg) 1700.9 (19.3)    Urine (mL/kg/hr) 475    Other -1330    Total Output -855     Net +2555.9          Stool Occurrence 4 x      PHYSICAL EXAMINATION: General:  Chronically ill, well nourished, on NRM Neuro:  Awake and aler HEENT:  Pupils 3mm RTL, no JVD, no stridor Cardiovascular:  s1s2 tachy, no  rub, murmur Lungs:  Decreased rt base, no rhonchi Abdomen:  Soft, non tender Musculoskeletal:  No edema Skin:  No rash  LABS:  CBC Recent Labs     02/10/13  1000  02/11/13  0520  WBC  9.2  6.0  HGB  11.5*  9.4*  HCT  35.7*  28.4*  PLT  211  153   Coag's No results found for this basename: APTT, INR,  in the last 72 hours BMET Recent Labs     02/10/13  1326  02/10/13  1819  02/11/13  0520  NA  137  142  142  K  6.9*  4.5  5.3*  CL  103  105  106  CO2  17*  22  20  BUN  100*  67*  88*  CREATININE  10.44*  6.97*  9.27*  GLUCOSE  217*  116*  126*   Electrolytes Recent Labs     02/10/13  1326  02/10/13  1800  02/10/13  1819  02/11/13  0520  CALCIUM  8.1*   --   8.0*  8.3*  PHOS   --   8.4*   --    --    Sepsis Markers No results found for this basename: LACTICACIDVEN, PROCALCITON, O2SATVEN,  in the last 72 hours ABG Recent  Labs     02/10/13  2008  PHART  7.329*  PCO2ART  38.2  PO2ART  74.1*   Liver Enzymes Recent Labs     02/10/13  1000  02/11/13  0520  AST  190*  194*  ALT  155*  94*  ALKPHOS  84  54  BILITOT  0.3  0.2*  ALBUMIN  2.7*  2.2*   Cardiac Enzymes No results found for this basename: TROPONINI, PROBNP,  in the last 72 hours Glucose Recent Labs     02/10/13  1807  02/10/13  1837  02/10/13  2020  02/11/13  0029  02/11/13  0400  02/11/13  0717  GLUCAP  104*  116*  149*  146*  114*  120*    Imaging CXR reviewed.  RLL Infiltrate      ASSESSMENT / PLAN:  PULMONARY A:Acute respiratory failure - /aspn pna RLL No stridor P:   Prn Bipap for HD If worsens, may need intubation More HD 8/23 Albuterol/atrovent nebs q 4h  CARDIOVASCULAR A: Htn, afib rvr P:  Cont dilt drip, wean after HD today  RENAL A:  Was stg V CKD  -Now ESRD Hyperkalemia better  Uremia P:   HD again 8/23  GASTROINTESTINAL A:  No issues P:   Npo for now  HEMATOLOGIC A:  No issues P:  Follow WC  INFECTIOUS A:  Aspiration pna vs CAP P:    Ceftx/azithro ok  ENDOCRINE A:  DM-2   P:   SSI  NEUROLOGIC A:  Acute encephalopathy ? Uremic vs narcotics for procedure>>improved 8/23 P:   Follow after HD, doubt CVA since non focal - defer imaging for now  TODAY'S SUMMARY:CKD, now ESRD, asp pna, laryngeal CA, afib rvr,  resp failure. Plan HD today and wean oxygen and dilt drip.     I have personally obtained a history, examined the patient, evaluated laboratory and imaging results, formulated the assessment and plan and placed orders. CRITICAL CARE: The patient is critically ill with multiple organ systems failure and requires high complexity decision making for assessment and support, frequent evaluation and titration of therapies, application of advanced monitoring technologies and extensive interpretation of multiple databases. Critical Care Time devoted to patient care services described in this note is 35 minutes.    Shan Levans MD Beeper  [phone]  Cell  [phone]  If no response or cell goes to voicemail, call beeper [phone]   Pulmonary and Critical Care Medicine [facility] Pager: [phone]  02/11/2013, 8:40 AM

## 2013-02-11 NOTE — Progress Notes (Addendum)
INITIAL NUTRITION ASSESSMENT  DOCUMENTATION CODES Per approved criteria  -Not Applicable   INTERVENTION: 1.  Meals/snacks; resume diet once respiratory status improves.  Consider liberalizing diet order based on improvement in labs if appropriate as pt is requiring emergent dialysis- Regular vs. Renal 80-90g 2.  Enteral nutrition; initiate Adult Tube Feeding Protocol if pt is intubated which includes consult to RD for further management.  Addendum 1730:  MD has ordered NGT for pt, however RN not able to place at bedside. Family at bedside report little to no intake in the past 1 week.  They report radiation therapy has been extremely difficult for pt and he has tolerated poorly.  Pt c/o trouble swallowing.  Will continue to follow especially if pt receives swallow evaluation.  NUTRITION DIAGNOSIS: Inadequate oral intake related to omission of energy dense foods as evidenced by pt diet currently being held due to respiratory status.   Monitor:  1.  Food/Beverage; diet resumed with pt meeting >/=90% estimated needs with tolerance. 2.  Wt/wt change; monitor trends  Reason for Assessment: MST  67 y.o. male  Admitting Dx: Altered mental status  ASSESSMENT: Pt admitted with AMS r/t respiratory distress.  Pt with h/o end-stage renal disease now requiring emergent HD.  Pt with recent dx of vocal cord cancer (01/04/13) and is currently undergoing radiation therapy.  Per H&P, wife reported increased lethargy, weakness, and some confusion at home.  Pt is awake, somewhat alert, but lethargic and unable to answer questions at this time.  No family at bedside.  RN at bedside is setting up dialysis for pt.   RD notes some recent wt fluctuation between 190-195 lbs.  Will continue to monitor for nutritional adequacy.  Nutrition Focused Physical Exam:  Subcutaneous Fat:  Orbital Region: WNL Upper Arm Region: WNL Thoracic and Lumbar Region: WNL  Muscle:  Temple Region: WNL Clavicle Bone Region:  WSNL Clavicle and Acromion Bone Region: WNL Scapular Bone Region: WNL Dorsal Hand: WNL Patellar Region: WNL Anterior Thigh Region: WNL Posterior Calf Region: WNL  Edema: none present   Height: Ht Readings from Last 1 Encounters:  02/10/13 5' 10.47" (1.79 m)    Weight: Wt Readings from Last 1 Encounters:  02/11/13 194 lb 7.1 oz (88.2 kg)    Ideal Body Weight: 166 lbs  % Ideal Body Weight: 116%  Wt Readings from Last 10 Encounters:  02/11/13 194 lb 7.1 oz (88.2 kg)  02/09/13 190 lb 4.8 oz (86.32 kg)  02/06/13 196 lb 14.4 oz (89.313 kg)  01/30/13 199 lb (90.266 kg)  01/04/13 196 lb 5 oz (89.047 kg)  01/04/13 196 lb 5 oz (89.047 kg)  10/20/11 207 lb (93.895 kg)  04/21/11 210 lb (95.255 kg)  03/24/11 210 lb 8 oz (95.482 kg)    Usual Body Weight: 205-210 lbs  % Usual Body Weight: ~95%  BMI:  Body mass index is 27.53 kg/(m^2).  Estimated Nutritional Needs: Kcal: 2250-2480 Protein: 105-132g Fluid: ~2.2-2.5 L/day  Skin: intact  Diet Order: Renal 60/70-- currently held due to respiratory status  EDUCATION NEEDS: -Education needs addressed   Intake/Output Summary (Last 24 hours) at 02/11/13 1048 Last data filed at 02/11/13 0700  Gross per 24 hour  Intake 1700.92 ml  Output   -855 ml  Net 2555.92 ml    Last BM: 8/23, several BMs today  Labs:   Recent Labs Lab 02/10/13 1326 02/10/13 1800 02/10/13 1819 02/11/13 0520  NA 137  --  142 142  K 6.9*  --  4.5  5.3*  CL 103  --  105 106  CO2 17*  --  22 20  BUN 100*  --  67* 88*  CREATININE 10.44*  --  6.97* 9.27*  CALCIUM 8.1*  --  8.0* 8.3*  PHOS  --  8.4*  --   --   GLUCOSE 217*  --  116* 126*    CBG (last 3)   Recent Labs  02/11/13 0029 02/11/13 0400 02/11/13 0717  GLUCAP 146* 114* 120*    Scheduled Meds: . aspirin EC  81 mg Oral Daily  . aztreonam  2 g Intravenous Q24H  . calcium carbonate  1 tablet Oral BID WC  . darbepoetin (ARANESP) injection - DIALYSIS  100 mcg Intravenous Q  Sat-HD  . famotidine  20 mg Oral QHS  . fentaNYL  25 mcg Transdermal Q72H  . heparin  5,000 Units Subcutaneous Q8H  . insulin aspart  0-9 Units Subcutaneous Q4H  . magnesium oxide  400 mg Oral Daily  . metoprolol tartrate  25 mg Oral BID  . pantoprazole  40 mg Oral Daily  . sodium chloride  3 mL Intravenous Q12H  . venlafaxine  37.5 mg Oral BID    Continuous Infusions: . diltiazem (CARDIZEM) infusion 5 mg/hr (02/11/13 0445)    Past Medical History  Diagnosis Date  . Diabetes mellitus   . Hypertension   . GERD (gastroesophageal reflux disease)   . Fibromyalgia   . Chest pain   . Carotid artery occlusion   . Chronic kidney disease     - Stage 5- follwed by Dr Hyman Hopes  . Complication of anesthesia     agiation when he awaken  . Neuropathy   . Arthritis   . Sleep apnea   . H/O vitamin D deficiency   . Hearing loss   . Hypercholesteremia   . Myalgia and myositis   . 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 Cholecystectomy  1993  . Rotator cuff repair  07/06/2011    right  . Carotid endarterectomy Left   . Coolonoscopy    . Colonoscopy w/ biopsies and polypectomy      Hx: of  . Microlaryngoscopy with laser N/A 01/04/2013    Procedure: MICROLARYNGOSCOPY WITH BIOPSY/LASER;  Surgeon: Suzanna Obey, MD;  Location: [facility] OR;  Service: ENT;  Laterality: N/A;    Loyce Dys, MS RD LDN Clinical Inpatient Dietitian Pager: [phone] Weekend/After hours pager: [phone]

## 2013-02-12 ENCOUNTER — Inpatient Hospital Stay (HOSPITAL_COMMUNITY): Payer: Medicare Other

## 2013-02-12 DIAGNOSIS — J9600 Acute respiratory failure, unspecified whether with hypoxia or hypercapnia: Secondary | ICD-10-CM

## 2013-02-12 DIAGNOSIS — E43 Unspecified severe protein-calorie malnutrition: Secondary | ICD-10-CM | POA: Diagnosis present

## 2013-02-12 LAB — BLOOD GAS, ARTERIAL
Acid-base deficit: 2.1 mmol/L — ABNORMAL HIGH (ref 0.0–2.0)
Acid-base deficit: 2.4 mmol/L — ABNORMAL HIGH (ref 0.0–2.0)
Bicarbonate: 23.3 mEq/L (ref 20.0–24.0)
Delivery systems: POSITIVE
Drawn by: 24513
FIO2: 1 %
MECHVT: 500 mL
O2 Saturation: 93.1 %
RATE: 16 resp/min
TCO2: 24.8 mmol/L (ref 0–100)
pCO2 arterial: 48.4 mmHg — ABNORMAL HIGH (ref 35.0–45.0)
pO2, Arterial: 91.4 mmHg (ref 80.0–100.0)

## 2013-02-12 LAB — GLUCOSE, CAPILLARY
Glucose-Capillary: 128 mg/dL — ABNORMAL HIGH (ref 70–99)
Glucose-Capillary: 131 mg/dL — ABNORMAL HIGH (ref 70–99)
Glucose-Capillary: 106 mg/dL — ABNORMAL HIGH (ref 70–99)
Glucose-Capillary: 134 mg/dL — ABNORMAL HIGH (ref 70–99)

## 2013-02-12 LAB — BASIC METABOLIC PANEL
Calcium: 8.5 mg/dL (ref 8.4–10.5)
GFR calc non Af Amer: 7 mL/min — ABNORMAL LOW (ref >90)
Sodium: 141 mEq/L (ref 135–145)

## 2013-02-12 LAB — POCT I-STAT 3, ART BLOOD GAS (G3+)
Bicarbonate: 24 mEq/L (ref 20.0–24.0)
Patient temperature: 98.6

## 2013-02-12 LAB — CBC
Platelets: 197 10*3/uL (ref 150–400)
RBC: 3.31 MIL/uL — ABNORMAL LOW (ref 4.22–5.81)
WBC: 10.8 10*3/uL — ABNORMAL HIGH (ref 4.0–10.5)

## 2013-02-12 LAB — MAGNESIUM: Magnesium: 2.1 mg/dL (ref 1.5–2.5)

## 2013-02-12 MED ORDER — PROPOFOL 10 MG/ML IV EMUL
5.0000 ug/kg/min | INTRAVENOUS | Status: DC
  Administered 2013-02-12: 15 ug/kg/min via INTRAVENOUS
  Administered 2013-02-12: 25.168 ug/kg/min via INTRAVENOUS
  Administered 2013-02-12: 20 ug/kg/min via INTRAVENOUS
  Administered 2013-02-13: 18 ug/kg/min via INTRAVENOUS
  Administered 2013-02-13: 7.5 ug/kg/min via INTRAVENOUS
  Administered 2013-02-14: 30 ug/kg/min via INTRAVENOUS
  Administered 2013-02-14: 25 ug/kg/min via INTRAVENOUS
  Administered 2013-02-14: 30 ug/kg/min via INTRAVENOUS
  Filled 2013-02-12 (×6): qty 100

## 2013-02-12 MED ORDER — LORAZEPAM 2 MG/ML IJ SOLN
1.0000 mg | Freq: Once | INTRAMUSCULAR | Status: AC
  Administered 2013-02-12: 1 mg via INTRAVENOUS

## 2013-02-12 MED ORDER — CHLORHEXIDINE GLUCONATE 0.12 % MT SOLN
15.0000 mL | Freq: Two times a day (BID) | OROMUCOSAL | Status: DC
  Administered 2013-02-12 – 2013-02-25 (×24): 15 mL via OROMUCOSAL
  Filled 2013-02-12 (×30): qty 15

## 2013-02-12 MED ORDER — HALOPERIDOL LACTATE 5 MG/ML IJ SOLN
INTRAMUSCULAR | Status: AC
  Filled 2013-02-12: qty 1

## 2013-02-12 MED ORDER — PROPOFOL 10 MG/ML IV EMUL
INTRAVENOUS | Status: AC
  Administered 2013-02-12: 90 mg
  Filled 2013-02-12: qty 100

## 2013-02-12 MED ORDER — METOPROLOL TARTRATE 1 MG/ML IV SOLN
5.0000 mg | INTRAVENOUS | Status: DC | PRN
  Administered 2013-02-12 – 2013-02-14 (×2): 5 mg via INTRAVENOUS
  Filled 2013-02-12: qty 5

## 2013-02-12 MED ORDER — LEVALBUTEROL HCL 0.63 MG/3ML IN NEBU
0.6300 mg | INHALATION_SOLUTION | Freq: Four times a day (QID) | RESPIRATORY_TRACT | Status: DC
  Administered 2013-02-12: 0.63 mg via RESPIRATORY_TRACT
  Filled 2013-02-12 (×2): qty 3

## 2013-02-12 MED ORDER — METOPROLOL TARTRATE 25 MG PO TABS
25.0000 mg | ORAL_TABLET | Freq: Two times a day (BID) | ORAL | Status: DC
  Administered 2013-02-12 – 2013-02-14 (×6): 25 mg
  Filled 2013-02-12 (×8): qty 1

## 2013-02-12 MED ORDER — ROCURONIUM BROMIDE 50 MG/5ML IV SOLN
50.0000 mg | Freq: Once | INTRAVENOUS | Status: AC
  Administered 2013-02-12: 50 mg via INTRAVENOUS

## 2013-02-12 MED ORDER — BIOTENE DRY MOUTH MT LIQD
15.0000 mL | Freq: Four times a day (QID) | OROMUCOSAL | Status: DC
  Administered 2013-02-12 – 2013-02-25 (×39): 15 mL via OROMUCOSAL

## 2013-02-12 MED ORDER — LEVALBUTEROL HCL 0.63 MG/3ML IN NEBU
0.6300 mg | INHALATION_SOLUTION | Freq: Four times a day (QID) | RESPIRATORY_TRACT | Status: DC
  Administered 2013-02-12 – 2013-02-15 (×11): 0.63 mg via RESPIRATORY_TRACT
  Filled 2013-02-12 (×19): qty 3

## 2013-02-12 MED ORDER — METOPROLOL TARTRATE 1 MG/ML IV SOLN
INTRAVENOUS | Status: AC
  Filled 2013-02-12: qty 5

## 2013-02-12 MED ORDER — PANTOPRAZOLE SODIUM 40 MG IV SOLR
40.0000 mg | Freq: Every day | INTRAVENOUS | Status: DC
  Administered 2013-02-12 – 2013-02-13 (×2): 40 mg via INTRAVENOUS
  Filled 2013-02-12 (×2): qty 40

## 2013-02-12 MED ORDER — PROPOFOL 10 MG/ML IV BOLUS: 90.0000 mg | Freq: Once | INTRAVENOUS | Status: DC

## 2013-02-12 MED ORDER — VITAL AF 1.2 CAL PO LIQD
1000.0000 mL | ORAL | Status: DC
  Administered 2013-02-12 – 2013-02-13 (×2): 1000 mL
  Filled 2013-02-12 (×3): qty 1000

## 2013-02-12 MED ORDER — LORAZEPAM 2 MG/ML IJ SOLN
INTRAMUSCULAR | Status: AC
  Filled 2013-02-12: qty 1

## 2013-02-12 NOTE — Procedures (Signed)
Central Venous Catheter Insertion Procedure Note Victor Lane [id number] 29-Mar-1946  Procedure: Insertion of Central Venous Catheter Indications: Assessment of intravascular volume, Drug and/or fluid administration and Frequent blood sampling  Procedure Details Consent: Risks of procedure as well as the alternatives and risks of each were explained to the (patient/caregiver).  Consent for procedure obtained.  Time Out: Verified patient identification, verified procedure, site/side was marked, verified correct patient position, special equipment/implants available, medications/allergies/relevent history reviewed, required imaging and test results available.  Performed  Maximum sterile technique was used including antiseptics, cap, gloves, gown, hand hygiene, mask and sheet. Skin prep: Chlorhexidine; local anesthetic administered  A triple lumen catheter was placed in the left internal jugular vein to 19 cm using the Seldinger technique.  Evaluation Blood flow good Complications: No apparent complications Patient did tolerate procedure well. Chest X-ray ordered to verify placement.  CXR: pending.   Procedure performed under direct supervision of Dr. Delford Field and with ultrasound guidance for real time vessel cannulation.       Canary Brim, NP-C [facility] Pgr: [phone] or [phone]    02/12/2013, 9:38 AM  I was present for this procedure. Dorcas Carrow Beeper  [phone]  Cell  [phone]  If no response or cell goes to voicemail, call beeper [phone]

## 2013-02-12 NOTE — Progress Notes (Signed)
Dr. Darrick Penna notified of patient change in mental status and anxious behavior. Order for ABG, and Dr. Harlin Heys to bedside.  Corliss Skains

## 2013-02-12 NOTE — Progress Notes (Signed)
ANTIBIOTIC CONSULT NOTE - FOLLOW UP  Pharmacy Consult for Vancomycin + Cefepime Indication: rule out pneumonia  Allergies  Allergen Reactions  . Amoxicillin     Causes upset stomach    Patient Measurements: Height: 5\' 10"  (177.8 cm) Weight: 195 lb 5.2 oz (88.6 kg) IBW/kg (Calculated) : 73  Vital Signs: Temp: 98.6 F (37 C) (08/24 0732) Temp src: Oral (08/24 0732) BP: 74/47 mmHg (08/24 1000) Pulse Rate: 97 (08/24 1000) Intake/Output from previous day: 08/23 0701 - 08/24 0700 In: 624 [I.V.:474; IV Piggyback:150] Out: 540 [Urine:540] Intake/Output from this shift: Total I/O In: 102.8 [I.V.:102.8] Out: 15 [Urine:15]  Labs:  Recent Labs  02/10/13 1000  02/10/13 1819 02/11/13 0520 02/12/13 0500  WBC 9.2  --   --  6.0 10.8*  HGB 11.5*  --   --  9.4* 9.9*  PLT 211  --   --  153 197  CREATININE 11.22*  < > 6.97* 9.27* 7.26*  < > = values in this interval not displayed. Estimated Creatinine Clearance: 11.1 ml/min (by C-G formula based on Cr of 7.26).  Recent Labs  02/12/13 0535  VANCORANDOM 17.5     Microbiology: Recent Results (from the past 720 hour(s))  MRSA PCR SCREENING     Status: None   Collection Time    02/10/13  6:44 PM      Result Value Range Status   MRSA by PCR NEGATIVE  NEGATIVE Final   Comment:            The GeneXpert MRSA Assay (FDA     approved for NASAL specimens     only), is one component of a     comprehensive MRSA colonization     surveillance program. It is not     intended to diagnose MRSA     infection nor to guide or     monitor treatment for     MRSA infections.    Anti-infectives   Start     Dose/Rate Route Frequency Ordered Stop   02/11/13 2200  aztreonam (AZACTAM) 1 g in dextrose 5 % 50 mL IVPB  Status:  Discontinued     1 g 100 mL/hr over 30 Minutes Intravenous Every 24 hours 02/11/13 1201 02/11/13 1205   02/11/13 2200  ceFEPIme (MAXIPIME) 1 g in dextrose 5 % 50 mL IVPB     1 g 100 mL/hr over 30 Minutes Intravenous  Every 24 hours 02/11/13 1205 02/18/13 2159   02/11/13 1600  vancomycin (VANCOCIN) 500 mg in sodium chloride 0.9 % 100 mL IVPB     500 mg 100 mL/hr over 60 Minutes Intravenous  Once 02/11/13 1431 02/11/13 1802   02/10/13 2200  aztreonam (AZACTAM) 2 g in dextrose 5 % 50 mL IVPB  Status:  Discontinued     2 g 100 mL/hr over 30 Minutes Intravenous 3 times per day 02/10/13 1853 02/10/13 1928   02/10/13 2000  aztreonam (AZACTAM) 2 g in dextrose 5 % 50 mL IVPB  Status:  Discontinued     2 g 100 mL/hr over 30 Minutes Intravenous Every 24 hours 02/10/13 1928 02/11/13 1201   02/10/13 1945  vancomycin (VANCOCIN) 1,500 mg in sodium chloride 0.9 % 500 mL IVPB     1,500 mg 250 mL/hr over 120 Minutes Intravenous  Once 02/10/13 1937 02/10/13 2206   02/10/13 1900  ceFAZolin (ANCEF) IVPB 2 g/50 mL premix     2 g 100 mL/hr over 30 Minutes Intravenous On call 02/10/13 1853 02/10/13 2012  02/10/13 1500  vancomycin (VANCOCIN) IVPB 1000 mg/200 mL premix  Status:  Discontinued     1,000 mg 200 mL/hr over 60 Minutes Intravenous On call 02/10/13 1458 02/10/13 1501   02/10/13 1100  cefTRIAXone (ROCEPHIN) 1 g in dextrose 5 % 50 mL IVPB     1 g 100 mL/hr over 30 Minutes Intravenous  Once 02/10/13 1057 02/10/13 1159   02/10/13 1100  azithromycin (ZITHROMAX) 500 mg in dextrose 5 % 250 mL IVPB     500 mg 250 mL/hr over 60 Minutes Intravenous  Once 02/10/13 1057 02/10/13 1348      Assessment: [name] who continues on Vancomycin + Cefepime for r/o PNA -- now also concerned for aspiration. A random Vancomycin level this morning is therapeutic (Vanc random: 17.5 mcg/ml, goal of 15-25 mcg/ml). The patient is noted to have some residual UOP -- total of 0.3 ml/kg/hr over the past 24 hours. Despite this, the patient will likely not need any additional Vancomycin until after his planned HD session tomorrow.  Discussed possible broadening of antibiotics to cover anaerobes for potential aspiration with Dr. Florene Route today --  he wants to hold off on this for now. Consider adding if patient continues to spike fevers or WBC continues to climb.   Goal of Therapy:  Pre-HD Vancomycin trough of 15-25 mcg/ml Proper antibiotics for infection/cultures adjusted for renal/hepatic function   Plan:  1. No Vancomycin for now -- will plan to enter a maintenance dose after the next HD session planned for 8/25 (if the patient tolerates) 2. Continue Cefepime 1g IV every 24 hours 3. Will continue to follow HD schedule/duration, culture results, LOT, and antibiotic de-escalation plans   Georgina Pillion, PharmD, BCPS Clinical Pharmacist Pager: [phone] 02/12/2013 10:52 AM

## 2013-02-12 NOTE — Procedures (Addendum)
Intubation Procedure Note Victor Lane [id number] [birth year]  Procedure: Intubation Indications: Airway protection and maintenance  Procedure Details Consent: Unable to obtain consent because of emergent medical necessity. Time Out: Verified patient identification, verified procedure, site/side was marked, verified correct patient position, special equipment/implants available, medications/allergies/relevent history reviewed, required imaging and test results available. MAC and 4  Extensive old blood and scar tissue in larynx. Very difficult intubation. Would suggest Anesthesia/ENT at bedside for future intubations. Eventually able to obtain grade II view.  Evaluation Hemodynamic Status: BP stable throughout; O2 sats: transiently fell during during procedure Patient's Current Condition: stable Complications: No apparent complications Patient did tolerate procedure well. Chest X-ray ordered to verify placement.  CXR: pending.   Victor Lane R. 02/12/2013

## 2013-02-12 NOTE — Progress Notes (Addendum)
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 Jearld Fenton) ,undergoing RT [name]) adm to [facility] 8/22 with confusion, dyspnea, hypoxia, hyperkalemia & AKI with cr 11/ BUN 101 - transferred to [facility] for rt IJ permacath placement then , emergent HD. PCCM consulted 8/22 pm for worsening dyspnea while on HD requiring bipap. CXR shows BL nodular infiltrates ? pna vs fluid   SIGNIFICANT EVENTS / STUDIES:  01/04/13 microlaryngoscopy - Jearld Fenton  LINES / TUBES: RIJ permacath (IR) 8/22 >> L IV CVL 8/24 ETT 8/24  CULTURES: resp c/s 8/22 BCx2 8/22  Trach asp 8/24>>  ANTIBIOTICS: ceftx 8/22>> azithro 8/22 >>  SUBJECTIVE:  Required intubation overnight . Difficult airway  VITAL SIGNS: Temp:  [97.6 F (36.4 C)-99.3 F (37.4 C)] 98.6 F (37 C) (08/24 0732) Pulse Rate:  [52-161] 105 (08/24 0800) Resp:  [11-31] 15 (08/24 0800) BP: (83-169)/(42-108) 143/67 mmHg (08/24 0800) SpO2:  [81 %-100 %] 94 % (08/24 0800) FiO2 (%):  [15 %-100 %] 80 % (08/24 0800) Weight:  [88.6 kg (195 lb 5.2 oz)-90 kg (198 lb 6.6 oz)] 88.6 kg (195 lb 5.2 oz) (08/24 0500) HEMODYNAMICS: CV stable   VENTILATOR SETTINGS: Vent Mode:  [-] PRVC FiO2 (%):  [15 %-100 %] 80 % Set Rate:  [16 bmp] 16 bmp Vt Set:  [550 mL] 550 mL PEEP:  [5 cmH20] 5 cmH20 Plateau Pressure:  [18 cmH20-20 cmH20] 20 cmH20 INTAKE / OUTPUT: Intake/Output     08/23 0701 - 08/24 0700 08/24 0701 - 08/25 0700   I.V. (mL/kg) 474 (5.4) 51.4 (0.6)   IV Piggyback 150    Total Intake(mL/kg) 624 (7) 51.4 (0.6)   Urine (mL/kg/hr) 540 (0.3) 15 (0.1)   Other     Total Output 540 15   Net +84 +36.4       PHYSICAL EXAMINATION: General:  Chronically ill, well nourished, on NRM Neuro:  Awake and aler HEENT:  Pupils 3mm RTL, no JVD, no stridor Cardiovascular:  s1s2 tachy, no rub, murmur Lungs:  Rales, rhonchi Abdomen:  Soft, non tender Musculoskeletal:  No edema Skin:  No rash  LABS:  CBC Recent Labs     02/10/13  1000  02/11/13  0520  02/12/13  0500  WBC  9.2  6.0  10.8*  HGB  11.5*  9.4*  9.9*  HCT  35.7*  28.4*  29.5*  PLT  211  153  197   Coag's No results found for this basename: APTT, INR,  in the last 72 hours BMET Recent Labs     02/10/13  1819  02/11/13  0520  02/12/13  0500  NA  142  142  141  K  4.5  5.3*  4.7  CL  105  106  102  CO2  22  20  23   BUN  67*  88*  68*  CREATININE  6.97*  9.27*  7.26*  GLUCOSE  116*  126*  149*   Electrolytes Recent Labs     02/10/13  1326  02/10/13  1800  02/10/13  1819  02/11/13  0520  02/12/13  0500  CALCIUM  8.1*   --  8.0*  8.3*  8.5  MG   --    --    --    --   2.1  PHOS   --   8.4*   --    --    --    Sepsis Markers No results found for this basename: LACTICACIDVEN, PROCALCITON, O2SATVEN,  in the last 72 hours ABG Recent Labs     02/10/13  2008  02/12/13  0325  02/12/13  0535  PHART  7.329*  7.303*  7.348*  PCO2ART  38.2  48.4*  41.7  PO2ART  74.1*  91.4  73.0*   Liver Enzymes Recent Labs     02/10/13  1000  02/11/13  0520  AST  190*  194*  ALT  155*  94*  ALKPHOS  84  54  BILITOT  0.3  0.2*  ALBUMIN  2.7*  2.2*   Cardiac Enzymes No results found for this basename: TROPONINI, PROBNP,  in the last 72 hours Glucose Recent Labs     02/11/13  1155  02/11/13  1538  02/11/13  2003  02/11/13  2353  02/12/13  0351  02/12/13  0711  GLUCAP  107*  103*  115*  137*  128*  131*    Imaging CXR : pulm infiltrates, ETT.    ASSESSMENT / PLAN:  PULMONARY A:Acute respiratory failure - /aspn pna RLL Very difficult intubation , d/t laryngeal Ca P:   Full vent supp, increase peep Pt will  need trach>>call ENT BDs F/u c/s data Cont abx  CARDIOVASCULAR A: Htn, afib rvr>>rate better controlled, now sinus tachy P:  Cont dilt drip  RENAL A:  ESRD Hyperkalemia better  Uremia P:   HD per renal  GASTROINTESTINAL A:  PCM, start TF P:   TF  Will need G tube  HEMATOLOGIC A:  No issues P:  monitor  INFECTIOUS A:  HCAP, aspiration P:   Cont cefepime/vanco HCAP coverage/aspiration   ENDOCRINE A:  DM-2   P:   SSI  NEUROLOGIC A:  Encephalopathy d/t uremia and hypoxemia P:   Sedation protocol  TODAY'S SUMMARY:ESRD, laryngeal CA, VDRF, difficult airway, Asp pna, ALI,  Plan: full vent, ARDS protocol, HD, ABX, BDs, needs Trach/Gtube this week. ENT called.  Place CVL for access  I have personally obtained a history, examined the patient, evaluated laboratory and imaging results, formulated the assessment and plan and placed orders. CRITICAL CARE: The patient is critically ill with multiple organ systems failure and requires high complexity decision making for assessment and support, frequent evaluation and titration of therapies, application of advanced monitoring technologies and extensive interpretation of multiple databases. Critical Care Time devoted to patient care services described in this note is 35 minutes.    Shan Levans MD Beeper  [phone]  Cell  [phone]  If no response or cell goes to voicemail, call beeper [phone]   Pulmonary and Critical Care Medicine [facility] Pager: [phone]  02/12/2013, 9:05 AM

## 2013-02-12 NOTE — Progress Notes (Signed)
Subjective:  Events noted- HD done again yest- seemed to tolerate OK- relatively stable overnight but then tired out early this AM required intubation, seemingly from more upper airway/aspiration issues.  Still having tachycardia but controlled with meds   Objective Vital signs in last 24 hours: Filed Vitals:   02/12/13 0500 02/12/13 0515 02/12/13 0600 02/12/13 0732  BP: 83/42 108/55 96/57   Pulse: 56 52 97   Temp:    98.6 F (37 C)  TempSrc:    Oral  Resp: 21 20 15    Height:      Weight: 88.6 kg (195 lb 5.2 oz)     SpO2: 90% 92% 97%    Weight change: 3.7 kg (8 lb 2.5 oz)  Intake/Output Summary (Last 24 hours) at 02/12/13 0735 Last data filed at 02/12/13 0600  Gross per 24 hour  Intake 624.03 ml  Output    540 ml  Net  84.03 ml    Assessment/Plan: 67 year old male with multiple medical problems to include advanced renal insufficiency- now presents with hyperkalemia and uremia necessitating a semi emergent start to HD  1.Renal- advanced renal insufficiency at baseline, now uremic and hyperkalemic necessitating start to HD. First treatment 8/22-not tolerated  treatment the best with tachycardia and hypotension even though I was not removing fluid. Second treatment 8/23- I anticipate that he will now be HD dependant from here on and have told family that. Is having some UOP.  Plan for third treatment tomorrow 2. Hypertension/volume - initially seemed dry to me- holding lasix and other antihypertensives and hydrated gently at 50 cc per hour but now that is stopped-  run even with HD.   Still with occasional tachycardia- PRN beta blocker and cardizem drip 3. Hyperkalemia- better with HD 4. Anemia - hgb of 9's more in line with this clinical picture- have started ESA - no iron due to increased ferritin 5. Respiratory status- per CCM- now on vent- wondering if trach may be needed 6. Decreased MS- uremia- narcotics with minimal clearance- hopefully serial dialysis will help  7. Bilateral  pulm infiltrates- maxipime/vanc- again per CCM     Emmalyne Giacomo A    Labs: Basic Metabolic Panel:  Recent Labs Lab 02/10/13 1326 02/10/13 1800 02/10/13 1819 02/11/13 0520 02/12/13 0500  NA 137  --  142 142 141  K 6.9*  --  4.5 5.3* 4.7  CL 103  --  105 106 102  CO2 17*  --  22 20 23   GLUCOSE 217*  --  116* 126* 149*  BUN 100*  --  67* 88* 68*  CREATININE 10.44*  --  6.97* 9.27* 7.26*  CALCIUM 8.1*  --  8.0* 8.3* 8.5  PHOS  --  8.4*  --   --   --    Liver Function Tests:  Recent Labs Lab 02/10/13 1000 02/11/13 0520  AST 190* 194*  ALT 155* 94*  ALKPHOS 84 54  BILITOT 0.3 0.2*  PROT 6.9 5.4*  ALBUMIN 2.7* 2.2*    Recent Labs Lab 02/10/13 1000  LIPASE 34   No results found for this basename: AMMONIA,  in the last 168 hours CBC:  Recent Labs Lab 02/10/13 1000 02/11/13 0520 02/12/13 0500  WBC 9.2 6.0 10.8*  NEUTROABS 8.0*  --   --   HGB 11.5* 9.4* 9.9*  HCT 35.7* 28.4* 29.5*  MCV 90.8 88.8 89.1  PLT 211 153 197   Cardiac Enzymes: No results found for this basename: CKTOTAL, CKMB, CKMBINDEX, TROPONINI,  in the last 168 hours CBG:  Recent Labs Lab 02/11/13 1538 02/11/13 2003 02/11/13 2353 02/12/13 0351 02/12/13 0711  GLUCAP 103* 115* 137* 128* 131*    Iron Studies:   Recent Labs  02/10/13 1326  IRON 17*  TIBC 160*  FERRITIN 1264*   Studies/Results: Dg Chest 2 View  02/10/2013   *RADIOLOGY REPORT*  Clinical Data: Tachycardia  CHEST - 2 VIEW  Comparison: 01/04/2013  Findings: Cardiac shadow is stable.  New bilateral lung infiltrates are seen within the lingula and right middle lobe as well as the left lower lobe.  No sizable effusion is seen.  No acute bony abnormality is noted.  IMPRESSION: New bilateral infiltrates.   Original Report Authenticated By: Alcide Clever, M.D.   Ir Fluoro Guide Cv Line Right  02/10/2013   *RADIOLOGY REPORT*  Clinical data: Acute worsening of renal insufficiency.  Needs access for hemodialysis.   TUNNELED HEMODIALYSIS CATHETER PLACEMENT WITH ULTRASOUND AND FLUOROSCOPIC GUIDANCE  Comparison: None available  Technique: The procedure, risks, benefits, and alternatives were explained to the patient.  Questions regarding the procedure were encouraged and answered.  The patient understands and consents to the procedure.  As antibiotic prophylaxis, cefazolin 1 grain was ordered pre- procedure and administered intravenously within one hour of incision.  Patency of the right IJ vein was confirmed with ultrasound with image documentation. An appropriate skin site was determined. Region was prepped using maximum barrier technique including cap and mask, sterile gown, sterile gloves, large sterile sheet, and Chlorhexidine   as cutaneous antisepsis. The region was infiltrated locally with 1% lidocaine.  Intravenous Fentanyl and Versed were administered as conscious sedation during continuous cardiorespiratory monitoring by the radiology RN, with a total moderate sedation time of nine minutes.  Under real-time ultrasound guidance, the right IJ vein was accessed with a 21 gauge micropuncture needle; the needle tip within the vein was confirmed with ultrasound image documentation.   Needle exchanged over the 018 guidewire for transitional dilator, which allowed advancement of a Benson wire into the IVC. Over this, an MPA catheter was advanced. A Hemosplit 19 hemodialysis catheter was tunneled from the right anterior chest wall approach to the right IJ dermatotomy site. The MPA catheter was exchanged over an Amplatz wire for serial vascular dilators which allow placement of a peel- away sheath, through which the catheter was advanced under intermittent fluoroscopy, positioned with its tips in the proximal and midright atrium. Spot chest radiograph confirms good catheter position. No pneumothorax. Catheter was flushed and primed per protocol. Catheter secured externally with O Prolene sutures. The right IJ   dermatotomy site  was closed with  Dermabond. No immediate complication.  Fluoroscopy time: 15 seconds  IMPRESSION:  1. Technically successful placement of tunneled right IJ hemodialysis catheter with ultrasound and fluoroscopic guidance. Ready for routine use.   Original Report Authenticated By: D. Andria Rhein, MD   Ir US Guide Vasc Access Right  02/10/2013   *RADIOLOGY REPORT*  Clinical data: Acute worsening of renal insufficiency.  Needs access for hemodialysis.  TUNNELED HEMODIALYSIS CATHETER PLACEMENT WITH ULTRASOUND AND FLUOROSCOPIC GUIDANCE  Comparison: None available  Technique: The procedure, risks, benefits, and alternatives were explained to the patient.  Questions regarding the procedure were encouraged and answered.  The patient understands and consents to the procedure.  As antibiotic prophylaxis, cefazolin 1 grain was ordered pre- procedure and administered intravenously within one hour of incision.  Patency of the right IJ vein was confirmed with ultrasound with image documentation. An appropriate skin  site was determined. Region was prepped using maximum barrier technique including cap and mask, sterile gown, sterile gloves, large sterile sheet, and Chlorhexidine   as cutaneous antisepsis. The region was infiltrated locally with 1% lidocaine.  Intravenous Fentanyl and Versed were administered as conscious sedation during continuous cardiorespiratory monitoring by the radiology RN, with a total moderate sedation time of nine minutes.  Under real-time ultrasound guidance, the right IJ vein was accessed with a 21 gauge micropuncture needle; the needle tip within the vein was confirmed with ultrasound image documentation.   Needle exchanged over the 018 guidewire for transitional dilator, which allowed advancement of a Benson wire into the IVC. Over this, an MPA catheter was advanced. A Hemosplit 19 hemodialysis catheter was tunneled from the right anterior chest wall approach to the right IJ dermatotomy site. The MPA  catheter was exchanged over an Amplatz wire for serial vascular dilators which allow placement of a peel- away sheath, through which the catheter was advanced under intermittent fluoroscopy, positioned with its tips in the proximal and midright atrium. Spot chest radiograph confirms good catheter position. No pneumothorax. Catheter was flushed and primed per protocol. Catheter secured externally with O Prolene sutures. The right IJ   dermatotomy site was closed with  Dermabond. No immediate complication.  Fluoroscopy time: 15 seconds  IMPRESSION:  1. Technically successful placement of tunneled right IJ hemodialysis catheter with ultrasound and fluoroscopic guidance. Ready for routine use.   Original Report Authenticated By: D. Andria Rhein, MD   Dg Chest Port 1 View  02/12/2013   *RADIOLOGY REPORT*  Clinical Data: Endotracheal tube placement.  PORTABLE CHEST - 1 VIEW  Comparison: 02/10/2013  Findings: Interval placement of an endotracheal tube with tip measuring about 5.5 cm above the carina.  Stable appearance of right central venous catheter with tip in the cavoatrial junction. Borderline heart size.  Pulmonary vascularity is increasing since previous study.  There is increasing airspace disease in both lower lungs in the left mid lung.  Changes could represent increasing edema or pneumonia.  Calcification of the aorta.  No pneumothorax. No blunting of costophrenic angles.  IMPRESSION: Appliances appear in satisfactory position.  Increasing heart size and pulmonary vascular congestion with increasing airspace disease in the lungs suggesting increasing edema or pneumonia.   Original Report Authenticated By: Burman Nieves, M.D.   Dg Chest Port 1 View  02/10/2013   CLINICAL DATA:  Status post central line insertion.  EXAM: PORTABLE CHEST - 1 VIEW  COMPARISON:  Earlier today.  FINDINGS: Interval right jugular double-lumen catheter with its tip in the superior vena cava. No pneumothorax. Increased right basilar  atelectasis. Decreased bilateral patchy airspace opacity. Normal sized heart. Thoracic spine degenerative changes.  IMPRESSION: 1. Right jugular port catheter tip in the superior vena cava without pneumothorax.  2.  Increased right basal atelectasis.  3. Mildly decreased bilateral pneumonia or alveolar edema.   Electronically Signed   By: Gordan Payment   On: 02/10/2013 17:38   Medications: Infusions: . diltiazem (CARDIZEM) infusion 15 mg/hr (02/12/13 0615)  . propofol 20 mcg/kg/min (02/12/13 0537)    Scheduled Medications: . antiseptic oral rinse  15 mL Mouth Rinse QID  . aspirin EC  81 mg Oral Daily  . calcium carbonate  1 tablet Oral BID WC  . ceFEPime (MAXIPIME) IV  1 g Intravenous Q24H  . chlorhexidine  15 mL Mouth Rinse BID  . darbepoetin (ARANESP) injection - DIALYSIS  100 mcg Intravenous Q Sat-HD  . fentaNYL  25 mcg  Transdermal Q72H  . haloperidol lactate      . heparin  5,000 Units Subcutaneous Q8H  . insulin aspart  0-9 Units Subcutaneous Q4H  . metoprolol tartrate  25 mg Oral BID  . pantoprazole (PROTONIX) IV  40 mg Intravenous Daily  . propofol  90 mg Intravenous Once  . sodium chloride  3 mL Intravenous Q12H  . venlafaxine  37.5 mg Oral BID    have reviewed scheduled and prn medications.  Physical Exam: General: somnolent but arousable Heart: RRR Lungs: poor effort, crackles Abdomen: obese, distended Extremities: no edema Dialysis Access: PC placed by IR on 8/22    02/12/2013,7:35 AM  LOS: 2 days

## 2013-02-12 NOTE — Progress Notes (Signed)
Dr. Curt Bears at the bedside, patient with Spo2 in the 80's  on BiPAP with increased work of breathing. Patient intubated by MD, propofol drip for sedation, family notified.  Corliss Skains RN

## 2013-02-12 NOTE — Consult Note (Signed)
Reason for Consult: Airway obstruction Referring Physician: Pulmonary  Victor Lane is an 67 y.o. male.  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 He now presents with mental status changes and renal insufficiency. He had some respiratory decompensation and required intubation. Apparently the intubation was difficult which probably is secondary to the radiation swelling as well as to some degree the cancer. He also has some aspiration pneumonia issues. It's unclear how long he will require intubation and extubation may be difficult secondary to the swelling and now the irritation of the endotracheal tube. He is currently being treated for his renal failure with dialysis.  Past Medical History  Diagnosis Date  . Diabetes mellitus   . Hypertension   . GERD (gastroesophageal reflux disease)   . Fibromyalgia   . Chest pain   . Carotid artery occlusion   . Chronic kidney disease     - Stage 5- follwed by Dr Hyman Hopes  . Complication of anesthesia     agiation when he awaken  . Neuropathy   . Arthritis   . Sleep apnea   . H/O vitamin D deficiency   . Hearing loss   . Hypercholesteremia   . Myalgia and myositis   . 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 Cholecystectomy  1993  . Rotator cuff repair  07/06/2011    right  . Carotid endarterectomy Left   . Coolonoscopy    . Colonoscopy w/ biopsies and polypectomy      Hx: of  . Microlaryngoscopy with laser N/A 01/04/2013    Procedure: MICROLARYNGOSCOPY WITH BIOPSY/LASER;  Surgeon: Suzanna Obey, MD;  Location: [facility] OR;  Service: ENT;  Laterality: N/A;    Family History  Problem Relation Age of Onset  . Adopted: Yes    Social History:  reports that he quit smoking about 16 years ago. His smoking use included Cigarettes. He  has a 62 pack-year smoking history. He has never used smokeless tobacco. He reports that he does not drink alcohol or use illicit drugs.  Allergies:  Allergies  Allergen Reactions  . Amoxicillin     Causes upset stomach    Medications: I have reviewed the patient's current medications.  Results for orders placed during the hospital encounter of 02/10/13 (from the past 48 hour(s))  VITAMIN B12     Status: None   Collection Time    02/10/13  1:26 PM      Result Value Range   Vitamin B-12 570  211 - 911 pg/mL   Comment: Performed at [facility]  FOLATE     Status: None   Collection Time    02/10/13  1:26 PM      Result Value Range   Folate 19.8     Comment: (NOTE)     Reference Ranges            Deficient:       0.4 - 3.3 ng/mL            Indeterminate:   3.4 - 5.4 ng/mL            Normal:              > 5.4 ng/mL     Performed at [facility]  IRON AND TIBC     Status: Abnormal   Collection Time    02/10/13  1:26 PM      Result Value Range   Iron 17 (*) 42 - 135 ug/dL   TIBC 782 (*) 956 - 213 ug/dL   Saturation Ratios 11 (*) 20 - 55 %   UIBC 143  125 - 400 ug/dL   Comment: Performed at [facility]  FERRITIN     Status: Abnormal   Collection Time    02/10/13  1:26 PM      Result Value Range   Ferritin 1264 (*) 22 - 322 ng/mL   Comment: Performed at [facility]  RETICULOCYTES     Status: Abnormal   Collection Time    02/10/13  1:26 PM      Result Value Range   Retic Ct Pct 1.6  0.4 - 3.1 %   RBC. 3.71 (*) 4.22 - 5.81 MIL/uL   Retic Count, Manual 59.4  19.0 - 186.0 K/uL  LACTIC ACID, PLASMA     Status: None   Collection Time    02/10/13  1:26 PM      Result Value Range   Lactic Acid, Venous 1.9  0.5 - 2.2 mmol/L  BASIC METABOLIC PANEL     Status: Abnormal   Collection Time    02/10/13  1:26 PM      Result Value Range   Sodium 137  135 - 145 mEq/L   Potassium 6.9 (*) 3.5 - 5.1 mEq/L   Comment: NO VISIBLE HEMOLYSIS      CRITICAL RESULT CALLED TO, READ BACK BY AND VERIFIED WITH:     MERKLE,B. RN AT 1425 02/10/13 BARFIELD,T   Chloride 103  96 - 112 mEq/L   CO2 17 (*) 19 - 32 mEq/L   Glucose, Bld 217 (*) 70 - 99 mg/dL   BUN 086 (*) 6 - 23 mg/dL   Creatinine, Ser 57.84 (*) 0.50 - 1.35 mg/dL   Calcium 8.1 (*) 8.4 - 10.5 mg/dL   GFR calc non Af Amer 4 (*) >90 mL/min   GFR calc Af Amer 5 (*) >90 mL/min   Comment: (NOTE)     The eGFR has been calculated using the CKD EPI equation.     This calculation has not been validated in all clinical situations.     eGFR's persistently <90 mL/min signify possible Chronic Kidney     Disease.  GLUCOSE, CAPILLARY     Status: Abnormal   Collection Time    02/10/13  1:27 PM      Result Value Range   Glucose-Capillary 196 (*) 70 - 99 mg/dL  HEPATITIS B SURFACE ANTIGEN     Status: None   Collection Time    02/10/13  6:00 PM      Result Value Range   Hepatitis B Surface Ag NEGATIVE  NEGATIVE   Comment: Performed at [facility]  HEPATITIS B SURFACE ANTIBODY     Status: Abnormal   Collection Time    02/10/13  6:00 PM      Result Value Range   Hep B S Ab POSITIVE (*) NEGATIVE   Comment: Performed at [facility]  PHOSPHORUS     Status: Abnormal   Collection Time    02/10/13  6:00 PM      Result Value Range   Phosphorus 8.4 (*) 2.3 - 4.6 mg/dL  HIV ANTIBODY (ROUTINE TESTING)     Status: None   Collection  Time    02/10/13  6:00 PM      Result Value Range   HIV NON REACTIVE  NON REACTIVE   Comment: Performed at [facility]  GLUCOSE, CAPILLARY     Status: Abnormal   Collection Time    02/10/13  6:07 PM      Result Value Range   Glucose-Capillary 104 (*) 70 - 99 mg/dL  BASIC METABOLIC PANEL     Status: Abnormal   Collection Time    02/10/13  6:19 PM      Result Value Range   Sodium 142  135 - 145 mEq/L   Potassium 4.5  3.5 - 5.1 mEq/L   Chloride 105  96 - 112 mEq/L   CO2 22  19 - 32 mEq/L   Glucose, Bld 116 (*) 70 - 99 mg/dL   BUN 67  (*) 6 - 23 mg/dL   Creatinine, Ser 1.47 (*) 0.50 - 1.35 mg/dL   Calcium 8.0 (*) 8.4 - 10.5 mg/dL   GFR calc non Af Amer 7 (*) >90 mL/min   GFR calc Af Amer 8 (*) >90 mL/min   Comment: (NOTE)     The eGFR has been calculated using the CKD EPI equation.     This calculation has not been validated in all clinical situations.     eGFR's persistently <90 mL/min signify possible Chronic Kidney     Disease.  GLUCOSE, CAPILLARY     Status: Abnormal   Collection Time    02/10/13  6:37 PM      Result Value Range   Glucose-Capillary 116 (*) 70 - 99 mg/dL  MRSA PCR SCREENING     Status: None   Collection Time    02/10/13  6:44 PM      Result Value Range   MRSA by PCR NEGATIVE  NEGATIVE   Comment:            The GeneXpert MRSA Assay (FDA     approved for NASAL specimens     only), is one component of a     comprehensive MRSA colonization     surveillance program. It is not     intended to diagnose MRSA     infection nor to guide or     monitor treatment for     MRSA infections.  URINALYSIS, ROUTINE W REFLEX MICROSCOPIC     Status: Abnormal   Collection Time    02/10/13  7:53 PM      Result Value Range   Color, Urine YELLOW  YELLOW   APPearance CLOUDY (*) CLEAR   Specific Gravity, Urine 1.023  1.005 - 1.030   pH 5.5  5.0 - 8.0   Glucose, UA 100 (*) NEGATIVE mg/dL   Hgb urine dipstick LARGE (*) NEGATIVE   Bilirubin Urine NEGATIVE  NEGATIVE   Ketones, ur NEGATIVE  NEGATIVE mg/dL   Protein, ur >829 (*) NEGATIVE mg/dL   Urobilinogen, UA 0.2  0.0 - 1.0 mg/dL   Nitrite NEGATIVE  NEGATIVE   Leukocytes, UA NEGATIVE  NEGATIVE  LEGIONELLA ANTIGEN, URINE     Status: None   Collection Time    02/10/13  7:53 PM      Result Value Range   Specimen Description URINE, CATHETERIZED     Special Requests NONE     Legionella Antigen, Urine       Value: Negative for Legionella pneumophilia serogroup 1     Performed at [facility]   Report Status 02/11/2013  FINAL    STREP PNEUMONIAE  URINARY ANTIGEN     Status: None   Collection Time    02/10/13  7:53 PM      Result Value Range   Strep Pneumo Urinary Antigen NEGATIVE  NEGATIVE   Comment:            Infection due to S. pneumoniae     cannot be absolutely ruled out     since the antigen present     may be below the detection limit     of the test.  URINE MICROSCOPIC-ADD ON     Status: None   Collection Time    02/10/13  7:53 PM      Result Value Range   WBC, UA 0-2  <3 WBC/hpf   Urine-Other AMORPHOUS URATES/PHOSPHATES    BLOOD GAS, ARTERIAL     Status: Abnormal   Collection Time    02/10/13  8:08 PM      Result Value Range   FIO2 50.00     Delivery systems BILEVEL POSITIVE AIRWAY PRESSURE     Mode BILEVEL POSITIVE AIRWAY PRESSURE     Rate 8.0     Inspiratory PAP 12.0     Expiratory PAP 6.0     pH, Arterial 7.329 (*) 7.350 - 7.450   pCO2 arterial 38.2  35.0 - 45.0 mmHg   pO2, Arterial 74.1 (*) 80.0 - 100.0 mmHg   Bicarbonate 19.6 (*) 20.0 - 24.0 mEq/L   TCO2 20.7  0 - 100 mmol/L   Acid-base deficit 5.3 (*) 0.0 - 2.0 mmol/L   O2 Saturation 93.8     Patient temperature 98.6     Collection site RIGHT RADIAL     Drawn by [phone]     Sample type ARTERIAL     Allens test (pass/fail) PASS  PASS  GLUCOSE, CAPILLARY     Status: Abnormal   Collection Time    02/10/13  8:20 PM      Result Value Range   Glucose-Capillary 149 (*) 70 - 99 mg/dL  GLUCOSE, CAPILLARY     Status: Abnormal   Collection Time    02/11/13 12:29 AM      Result Value Range   Glucose-Capillary 146 (*) 70 - 99 mg/dL  GLUCOSE, CAPILLARY     Status: Abnormal   Collection Time    02/11/13  4:00 AM      Result Value Range   Glucose-Capillary 114 (*) 70 - 99 mg/dL  CBC     Status: Abnormal   Collection Time    02/11/13  5:20 AM      Result Value Range   WBC 6.0  4.0 - 10.5 K/uL   RBC 3.20 (*) 4.22 - 5.81 MIL/uL   Hemoglobin 9.4 (*) 13.0 - 17.0 g/dL   HCT 60.4 (*) 54.0 - 98.1 %   MCV 88.8  78.0 - 100.0 fL   MCH 29.4  26.0 - 34.0 pg    MCHC 33.1  30.0 - 36.0 g/dL   RDW 19.1  47.8 - 29.5 %   Platelets 153  150 - 400 K/uL  COMPREHENSIVE METABOLIC PANEL     Status: Abnormal   Collection Time    02/11/13  5:20 AM      Result Value Range   Sodium 142  135 - 145 mEq/L   Potassium 5.3 (*) 3.5 - 5.1 mEq/L   Chloride 106  96 - 112 mEq/L   CO2 20  19 - 32 mEq/L  Glucose, Bld 126 (*) 70 - 99 mg/dL   BUN 88 (*) 6 - 23 mg/dL   Creatinine, Ser 1.61 (*) 0.50 - 1.35 mg/dL   Calcium 8.3 (*) 8.4 - 10.5 mg/dL   Total Protein 5.4 (*) 6.0 - 8.3 g/dL   Albumin 2.2 (*) 3.5 - 5.2 g/dL   AST 096 (*) 0 - 37 U/L   ALT 94 (*) 0 - 53 U/L   Alkaline Phosphatase 54  39 - 117 U/L   Total Bilirubin 0.2 (*) 0.3 - 1.2 mg/dL   GFR calc non Af Amer 5 (*) >90 mL/min   GFR calc Af Amer 6 (*) >90 mL/min   Comment: (NOTE)     The eGFR has been calculated using the CKD EPI equation.     This calculation has not been validated in all clinical situations.     eGFR's persistently <90 mL/min signify possible Chronic Kidney     Disease.  GLUCOSE, CAPILLARY     Status: Abnormal   Collection Time    02/11/13  7:17 AM      Result Value Range   Glucose-Capillary 120 (*) 70 - 99 mg/dL  GLUCOSE, CAPILLARY     Status: Abnormal   Collection Time    02/11/13 11:55 AM      Result Value Range   Glucose-Capillary 107 (*) 70 - 99 mg/dL  GLUCOSE, CAPILLARY     Status: Abnormal   Collection Time    02/11/13  3:38 PM      Result Value Range   Glucose-Capillary 103 (*) 70 - 99 mg/dL  GLUCOSE, CAPILLARY     Status: Abnormal   Collection Time    02/11/13  8:03 PM      Result Value Range   Glucose-Capillary 115 (*) 70 - 99 mg/dL   Comment 1 Notify RN    GLUCOSE, CAPILLARY     Status: Abnormal   Collection Time    02/11/13 11:53 PM      Result Value Range   Glucose-Capillary 137 (*) 70 - 99 mg/dL   Comment 1 Notify RN    BLOOD GAS, ARTERIAL     Status: Abnormal   Collection Time    02/12/13  3:25 AM      Result Value Range   FIO2 1.00     Delivery  systems BILEVEL POSITIVE AIRWAY PRESSURE     Inspiratory PAP 14     Expiratory PAP 6     pH, Arterial 7.303 (*) 7.350 - 7.450   pCO2 arterial 48.4 (*) 35.0 - 45.0 mmHg   pO2, Arterial 91.4  80.0 - 100.0 mmHg   Bicarbonate 23.3  20.0 - 24.0 mEq/L   TCO2 24.8  0 - 100 mmol/L   Acid-base deficit 2.1 (*) 0.0 - 2.0 mmol/L   O2 Saturation 97.7     Patient temperature 98.6     Collection site RIGHT RADIAL     Drawn by [phone]     Sample type ARTERIAL DRAW     Allens test (pass/fail) PASS  PASS  GLUCOSE, CAPILLARY     Status: Abnormal   Collection Time    02/12/13  3:51 AM      Result Value Range   Glucose-Capillary 128 (*) 70 - 99 mg/dL   Comment 1 Notify RN    BASIC METABOLIC PANEL     Status: Abnormal   Collection Time    02/12/13  5:00 AM      Result Value Range  Sodium 141  135 - 145 mEq/L   Potassium 4.7  3.5 - 5.1 mEq/L   Chloride 102  96 - 112 mEq/L   CO2 23  19 - 32 mEq/L   Glucose, Bld 149 (*) 70 - 99 mg/dL   BUN 68 (*) 6 - 23 mg/dL   Creatinine, Ser 4.09 (*) 0.50 - 1.35 mg/dL   Calcium 8.5  8.4 - 81.1 mg/dL   GFR calc non Af Amer 7 (*) >90 mL/min   GFR calc Af Amer 8 (*) >90 mL/min   Comment: (NOTE)     The eGFR has been calculated using the CKD EPI equation.     This calculation has not been validated in all clinical situations.     eGFR's persistently <90 mL/min signify possible Chronic Kidney     Disease.  CBC     Status: Abnormal   Collection Time    02/12/13  5:00 AM      Result Value Range   WBC 10.8 (*) 4.0 - 10.5 K/uL   RBC 3.31 (*) 4.22 - 5.81 MIL/uL   Hemoglobin 9.9 (*) 13.0 - 17.0 g/dL   HCT 91.4 (*) 78.2 - 95.6 %   MCV 89.1  78.0 - 100.0 fL   MCH 29.9  26.0 - 34.0 pg   MCHC 33.6  30.0 - 36.0 g/dL   RDW 21.3  08.6 - 57.8 %   Platelets 197  150 - 400 K/uL  MAGNESIUM     Status: None   Collection Time    02/12/13  5:00 AM      Result Value Range   Magnesium 2.1  1.5 - 2.5 mg/dL  VANCOMYCIN, RANDOM     Status: None   Collection Time    02/12/13   5:35 AM      Result Value Range   Vancomycin Rm 17.5     Comment:            Random Vancomycin therapeutic     range is dependent on dosage and     time of specimen collection.     A peak range is 20.0-40.0 ug/mL     A trough range is 5.0-15.0 ug/mL             BLOOD GAS, ARTERIAL     Status: Abnormal   Collection Time    02/12/13  5:35 AM      Result Value Range   FIO2 1.00     Delivery systems VENTILATOR     Mode PRESSURE REGULATED VOLUME CONTROL     VT 500     Rate 16     Peep/cpap 5.0     pH, Arterial 7.348 (*) 7.350 - 7.450   pCO2 arterial 41.7  35.0 - 45.0 mmHg   pO2, Arterial 73.0 (*) 80.0 - 100.0 mmHg   Bicarbonate 22.4  20.0 - 24.0 mEq/L   TCO2 23.6  0 - 100 mmol/L   Acid-base deficit 2.4 (*) 0.0 - 2.0 mmol/L   O2 Saturation 93.1     Patient temperature 98.6     Collection site RIGHT RADIAL     Drawn by [phone]     Sample type ARTERIAL DRAW     Allens test (pass/fail) PASS  PASS  GLUCOSE, CAPILLARY     Status: Abnormal   Collection Time    02/12/13  7:11 AM      Result Value Range   Glucose-Capillary 131 (*) 70 - 99 mg/dL    Dg  Chest 2 View  02/10/2013   *RADIOLOGY REPORT*  Clinical Data: Tachycardia  CHEST - 2 VIEW  Comparison: 01/04/2013  Findings: Cardiac shadow is stable.  New bilateral lung infiltrates are seen within the lingula and right middle lobe as well as the left lower lobe.  No sizable effusion is seen.  No acute bony abnormality is noted.  IMPRESSION: New bilateral infiltrates.   Original Report Authenticated By: Alcide Clever, M.D.   Ir Fluoro Guide Cv Line Right  02/10/2013   *RADIOLOGY REPORT*  Clinical data: Acute worsening of renal insufficiency.  Needs access for hemodialysis.  TUNNELED HEMODIALYSIS CATHETER PLACEMENT WITH ULTRASOUND AND FLUOROSCOPIC GUIDANCE  Comparison: None available  Technique: The procedure, risks, benefits, and alternatives were explained to the patient.  Questions regarding the procedure were encouraged and answered.  The  patient understands and consents to the procedure.  As antibiotic prophylaxis, cefazolin 1 grain was ordered pre- procedure and administered intravenously within one hour of incision.  Patency of the right IJ vein was confirmed with ultrasound with image documentation. An appropriate skin site was determined. Region was prepped using maximum barrier technique including cap and mask, sterile gown, sterile gloves, large sterile sheet, and Chlorhexidine   as cutaneous antisepsis. The region was infiltrated locally with 1% lidocaine.  Intravenous Fentanyl and Versed were administered as conscious sedation during continuous cardiorespiratory monitoring by the radiology RN, with a total moderate sedation time of nine minutes.  Under real-time ultrasound guidance, the right IJ vein was accessed with a 21 gauge micropuncture needle; the needle tip within the vein was confirmed with ultrasound image documentation.   Needle exchanged over the 018 guidewire for transitional dilator, which allowed advancement of a Benson wire into the IVC. Over this, an MPA catheter was advanced. A Hemosplit 19 hemodialysis catheter was tunneled from the right anterior chest wall approach to the right IJ dermatotomy site. The MPA catheter was exchanged over an Amplatz wire for serial vascular dilators which allow placement of a peel- away sheath, through which the catheter was advanced under intermittent fluoroscopy, positioned with its tips in the proximal and midright atrium. Spot chest radiograph confirms good catheter position. No pneumothorax. Catheter was flushed and primed per protocol. Catheter secured externally with O Prolene sutures. The right IJ   dermatotomy site was closed with  Dermabond. No immediate complication.  Fluoroscopy time: 15 seconds  IMPRESSION:  1. Technically successful placement of tunneled right IJ hemodialysis catheter with ultrasound and fluoroscopic guidance. Ready for routine use.   Original Report  Authenticated By: D. Andria Rhein, MD   Ir US Guide Vasc Access Right  02/10/2013   *RADIOLOGY REPORT*  Clinical data: Acute worsening of renal insufficiency.  Needs access for hemodialysis.  TUNNELED HEMODIALYSIS CATHETER PLACEMENT WITH ULTRASOUND AND FLUOROSCOPIC GUIDANCE  Comparison: None available  Technique: The procedure, risks, benefits, and alternatives were explained to the patient.  Questions regarding the procedure were encouraged and answered.  The patient understands and consents to the procedure.  As antibiotic prophylaxis, cefazolin 1 grain was ordered pre- procedure and administered intravenously within one hour of incision.  Patency of the right IJ vein was confirmed with ultrasound with image documentation. An appropriate skin site was determined. Region was prepped using maximum barrier technique including cap and mask, sterile gown, sterile gloves, large sterile sheet, and Chlorhexidine   as cutaneous antisepsis. The region was infiltrated locally with 1% lidocaine.  Intravenous Fentanyl and Versed were administered as conscious sedation during continuous cardiorespiratory monitoring by the  radiology RN, with a total moderate sedation time of nine minutes.  Under real-time ultrasound guidance, the right IJ vein was accessed with a 21 gauge micropuncture needle; the needle tip within the vein was confirmed with ultrasound image documentation.   Needle exchanged over the 018 guidewire for transitional dilator, which allowed advancement of a Benson wire into the IVC. Over this, an MPA catheter was advanced. A Hemosplit 19 hemodialysis catheter was tunneled from the right anterior chest wall approach to the right IJ dermatotomy site. The MPA catheter was exchanged over an Amplatz wire for serial vascular dilators which allow placement of a peel- away sheath, through which the catheter was advanced under intermittent fluoroscopy, positioned with its tips in the proximal and midright atrium. Spot  chest radiograph confirms good catheter position. No pneumothorax. Catheter was flushed and primed per protocol. Catheter secured externally with O Prolene sutures. The right IJ   dermatotomy site was closed with  Dermabond. No immediate complication.  Fluoroscopy time: 15 seconds  IMPRESSION:  1. Technically successful placement of tunneled right IJ hemodialysis catheter with ultrasound and fluoroscopic guidance. Ready for routine use.   Original Report Authenticated By: D. Andria Rhein, MD   Dg Chest Port 1 View  02/12/2013   *RADIOLOGY REPORT*  Clinical Data: Endotracheal tube placement.  PORTABLE CHEST - 1 VIEW  Comparison: 02/10/2013  Findings: Interval placement of an endotracheal tube with tip measuring about 5.5 cm above the carina.  Stable appearance of right central venous catheter with tip in the cavoatrial junction. Borderline heart size.  Pulmonary vascularity is increasing since previous study.  There is increasing airspace disease in both lower lungs in the left mid lung.  Changes could represent increasing edema or pneumonia.  Calcification of the aorta.  No pneumothorax. No blunting of costophrenic angles.  IMPRESSION: Appliances appear in satisfactory position.  Increasing heart size and pulmonary vascular congestion with increasing airspace disease in the lungs suggesting increasing edema or pneumonia.   Original Report Authenticated By: Burman Nieves, M.D.   Dg Chest Port 1 View  02/10/2013   CLINICAL DATA:  Status post central line insertion.  EXAM: PORTABLE CHEST - 1 VIEW  COMPARISON:  Earlier today.  FINDINGS: Interval right jugular double-lumen catheter with its tip in the superior vena cava. No pneumothorax. Increased right basilar atelectasis. Decreased bilateral patchy airspace opacity. Normal sized heart. Thoracic spine degenerative changes.  IMPRESSION: 1. Right jugular port catheter tip in the superior vena cava without pneumothorax.  2.  Increased right basal atelectasis.  3.  Mildly decreased bilateral pneumonia or alveolar edema.   Electronically Signed   By: Gordan Payment   On: 02/10/2013 17:38    ROS Blood pressure 143/67, pulse 96, temperature 98.6 F (37 C), temperature source Oral, resp. rate 18, height 5\' 10"  (1.778 m), weight 88.6 kg (195 lb 5.2 oz), SpO2 95.00%. Physical Exam  Constitutional: He appears well-developed.  HENT:  He has a endotracheal tube as well as an NG tube in position. There is no evidence of any excessive swelling. His neck is without significant swelling. His thyroid cartilage is easily palpable. There is no mass.  Neck: Neck supple.    Assessment/Plan: Respiratory failure/squamous cell carcinoma of the larynx-he had a difficult intubation and it's unclear how long he will require intubation. I have talked to the wife regarding the risks and benefits of tracheotomy. She certainly wants to give him the most comfortable situation which most likely this would be a trach especially if he  is going to have an extended intubation. It is concerning that with extubation he may still have enough swelling to give him respiratory distress or the endotracheal tube will worsen the laryngeal swelling. This may require emergent reintubation which could be even more difficult. The tracheotomy would seem to be a very reasonable approach. We talked about the procedure. I discussed the situation with Dr. Delford Field and he feels like the pulmonary team could do a percutaneous bedside tracheotomy and the wife is much more in favor of this option. This will be scheduled next week. I will be available for any further issues.  Suzanna Obey 02/12/2013, 10:21 AM

## 2013-02-12 NOTE — Progress Notes (Signed)
eLink Physician-Brief Progress Note Patient Name: Victor Lane DOB: [birth year] MRN: [id number]  Date of Service  02/12/2013   HPI/Events of Note  Patient remains on BiPAP but has been quite stable with the exception of intermittent elevations in HR to the 140s and anxiety along with inability to sleep.  Current sats of 97% with RR of 16.  HR is 145 with BP of 143/69.  Was not able to take standing dose of lopressor last pm.   eICU Interventions  Plan: Lopressor 5 mg IV times one dose now Ativan 1 mg IV for anxiety Monitor response to both.   Intervention Category Intermediate Interventions: Arrhythmia - evaluation and management Minor Interventions: Agitation / anxiety - evaluation and management  DETERDING,ELIZABETH 02/12/2013, 2:39 AM

## 2013-02-13 ENCOUNTER — Inpatient Hospital Stay (HOSPITAL_COMMUNITY): Payer: Medicare Other

## 2013-02-13 ENCOUNTER — Ambulatory Visit
Admission: RE | Admit: 2013-02-13 | Discharge: 2013-02-13 | Disposition: A | Discharge status: Home / Self Care | Payer: Medicare Other | Source: Ambulatory Visit | Attending: Radiation Oncology | Admitting: Radiation Oncology

## 2013-02-13 ENCOUNTER — Telehealth: Payer: Self-pay | Admitting: *Deleted

## 2013-02-13 LAB — GLUCOSE, CAPILLARY
Glucose-Capillary: 130 mg/dL — ABNORMAL HIGH (ref 70–99)
Glucose-Capillary: 137 mg/dL — ABNORMAL HIGH (ref 70–99)
Glucose-Capillary: 146 mg/dL — ABNORMAL HIGH (ref 70–99)
Glucose-Capillary: 150 mg/dL — ABNORMAL HIGH (ref 70–99)
Glucose-Capillary: 211 mg/dL — ABNORMAL HIGH (ref 70–99)

## 2013-02-13 LAB — CBC WITH DIFFERENTIAL/PLATELET
Hemoglobin: 8 g/dL — ABNORMAL LOW (ref 13.0–17.0)
Lymphs Abs: 0.8 10*3/uL (ref 0.7–4.0)
MCH: 29.1 pg (ref 26.0–34.0)
Monocytes Relative: 6 % (ref 3–12)
Neutro Abs: 6 10*3/uL (ref 1.7–7.7)
Neutrophils Relative %: 79 % — ABNORMAL HIGH (ref 43–77)
RBC: 2.75 MIL/uL — ABNORMAL LOW (ref 4.22–5.81)

## 2013-02-13 LAB — COMPREHENSIVE METABOLIC PANEL
AST: 58 U/L — ABNORMAL HIGH (ref 0–37)
CO2: 21 mEq/L (ref 19–32)
Calcium: 8.2 mg/dL — ABNORMAL LOW (ref 8.4–10.5)
Creatinine, Ser: 8.86 mg/dL — ABNORMAL HIGH (ref 0.50–1.35)
GFR calc non Af Amer: 5 mL/min — ABNORMAL LOW (ref >90)

## 2013-02-13 LAB — POCT I-STAT 3, ART BLOOD GAS (G3+)
Bicarbonate: 20.2 mEq/L (ref 20.0–24.0)
O2 Saturation: 93 %
Patient temperature: 98.6
TCO2: 22 mmol/L (ref 0–100)

## 2013-02-13 MED ORDER — HEPARIN SODIUM (PORCINE) 1000 UNIT/ML DIALYSIS
20.0000 [IU]/kg | INTRAMUSCULAR | Status: DC | PRN
  Filled 2013-02-13: qty 2

## 2013-02-13 MED ORDER — NEPRO/CARBSTEADY PO LIQD
1000.0000 mL | ORAL | Status: DC
  Administered 2013-02-13 – 2013-02-14 (×2): 1000 mL
  Filled 2013-02-13 (×5): qty 1000

## 2013-02-13 MED ORDER — PROPOFOL 10 MG/ML IV EMUL
5.0000 ug/kg/min | Freq: Once | INTRAVENOUS | Status: DC
  Filled 2013-02-13: qty 100

## 2013-02-13 MED ORDER — PRO-STAT SUGAR FREE PO LIQD
30.0000 mL | Freq: Three times a day (TID) | ORAL | Status: DC
  Administered 2013-02-13 – 2013-02-14 (×4): 30 mL
  Filled 2013-02-13 (×8): qty 30

## 2013-02-13 MED ORDER — FENTANYL CITRATE 0.05 MG/ML IJ SOLN
200.0000 ug | Freq: Once | INTRAMUSCULAR | Status: AC
  Administered 2013-02-14: 200 ug via INTRAVENOUS
  Filled 2013-02-13: qty 4

## 2013-02-13 MED ORDER — MIDAZOLAM HCL 2 MG/2ML IJ SOLN
4.0000 mg | Freq: Once | INTRAMUSCULAR | Status: AC
  Administered 2013-02-14: 4 mg via INTRAVENOUS
  Filled 2013-02-13: qty 4

## 2013-02-13 MED ORDER — ETOMIDATE 2 MG/ML IV SOLN
40.0000 mg | Freq: Once | INTRAVENOUS | Status: DC
  Filled 2013-02-13 (×2): qty 20

## 2013-02-13 MED ORDER — SODIUM CHLORIDE 0.9 % IV SOLN
20.0000 ug | Freq: Once | INTRAVENOUS | Status: AC
  Administered 2013-02-14: 20 ug via INTRAVENOUS
  Filled 2013-02-13 (×2): qty 5

## 2013-02-13 MED ORDER — VECURONIUM BROMIDE 10 MG IV SOLR: 10.0000 mg | Freq: Once | INTRAVENOUS | Status: DC

## 2013-02-13 MED ORDER — FENTANYL CITRATE 0.05 MG/ML IJ SOLN
25.0000 ug | INTRAMUSCULAR | Status: DC | PRN
  Administered 2013-02-13 – 2013-02-14 (×3): 100 ug via INTRAVENOUS
  Administered 2013-02-14: 50 ug via INTRAVENOUS
  Administered 2013-02-14: 75 ug via INTRAVENOUS
  Filled 2013-02-13 (×4): qty 2

## 2013-02-13 MED ORDER — FENTANYL CITRATE 0.05 MG/ML IJ SOLN
INTRAMUSCULAR | Status: AC
  Filled 2013-02-13: qty 2

## 2013-02-13 MED ORDER — PANTOPRAZOLE SODIUM 40 MG PO PACK
40.0000 mg | PACK | ORAL | Status: DC
  Administered 2013-02-14: 40 mg
  Filled 2013-02-13 (×3): qty 20

## 2013-02-13 MED ORDER — VANCOMYCIN HCL IN DEXTROSE 1-5 GM/200ML-% IV SOLN
1000.0000 mg | Freq: Once | INTRAVENOUS | Status: DC
  Administered 2013-02-13: 1000 mg via INTRAVENOUS
  Filled 2013-02-13 (×2): qty 200

## 2013-02-13 NOTE — Progress Notes (Signed)
Initial utilization review completed.  P.J. Kyli Sorter,RN,BSN Case Manager [phone]

## 2013-02-13 NOTE — Progress Notes (Signed)
Victor Lane was admitted with confusion, dyspnea, hypoxia, hyperkalemia and kidney failure and started emergent hemodialysis.   He may have aspiration PNA as well.  Currently in ICU. Failed BIPAP and intubated thereafter.    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  Reached out to his wife by phone and we spoke about letting him stabilize before attempting any more treatments.  While it is preferable to minimize treatment breaks in between RT sessions, she knows that it is not feasible to continue RT in his current state.   -----------------------------------  Lonie Peak, MD

## 2013-02-13 NOTE — Progress Notes (Signed)
Mr Mendenhall is an inpatient at [facility].  He is currently on a ventilator and will not receive radiation therapy today per Dr. Basilio Cairo.  Will monitor his status.  Linac # 1 notified of patiens' current status.

## 2013-02-13 NOTE — Telephone Encounter (Signed)
Reached patient's wife at home and received update on patient who was admitted to [facility] over weekend.  She expressed appreciation for call; I will continue to follow.  Young Berry, RN, BSN, [facility] Oncology Navigator [phone]

## 2013-02-13 NOTE — Progress Notes (Addendum)
NUTRITION FOLLOW UP  Intervention:   1. D/C Vital AF 1.2  2. Initiate Nepro @ 35 ml/hr.  3. 30 ml Prostat TID.  At goal rate, tube feeding regimen will provide 1812 kcal (99% of needs), 113 grams of protein, and 613 ml of H2O.   Current Propofol and TF regimen will provide 1917 total kcal (105% of needs)   Nutrition Dx:   Inadequate oral intake related to inability to eat as evidenced by NPO status; ongoing.   Goal:   Pt to meet >/= 90% of their estimated nutrition needs; not met.   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 Per nephrology pt will be HD dependent from now on. Hyperkalemia improved with HD. Patient is currently intubated on ventilator support secondary to aspiration pneumonia. Plan for trach 8/26 then determine if PEG needed.  MV: 9 L/min Temp:Temp (24hrs), Avg:98.4 F (36.9 C), Min:97.5 F (36.4 C), Max:99 F (37.2 C)  Propofol: 4 ml/hr providing 105 kcal per day from lipid  Vital AF 1.2 is infusing @ 40 ml/hr. Tube feeding regimen currently providing 1152 kcal, 72 grams protein, and 778 ml H2O.   Height: Ht Readings from Last 1 Encounters:  02/12/13 5\' 10"  (1.778 m)    Weight Status:   Wt Readings from Last 1 Encounters:  02/13/13 198 lb 3.1 oz (89.9 kg)  Admission weight 194 lb 8/22  Re-estimated needs:  Kcal: 1829 Protein: 105-123 Fluid: >1.2 L/day  Skin: no issues noted  Diet Order: NPO   Intake/Output Summary (Last 24 hours) at 02/13/13 1114 Last data filed at 02/13/13 1000  Gross per 24 hour  Intake 1204.11 ml  Output    400 ml  Net 804.11 ml    Last BM: via rectal pouch (placed 8/23) 300 ml documented 8/25   Labs:   Recent Labs Lab 02/10/13 1326  02/10/13 1800  02/11/13 0520 02/12/13 0500 02/13/13 0420  NA 137  --   < > 142 141 138  K 6.9*  --   < > 5.3* 4.7 3.7  CL 103  --   < > 106 102 101  CO2 17*  --   < > 20 23 21   BUN 100*  --   < > 88* 68* 93*  CREATININE 10.44*  --   < > 9.27* 7.26* 8.86*  CALCIUM 8.1*  --   < > 8.3* 8.5 8.2*  MG  --   --   --   --  2.1  --   PHOS  --  8.4*  --   --   --   --   GLUCOSE 217*  --   < > 126* 149* 159*  < > = values in this interval not displayed.  CBG (last 3)   Recent Labs  02/12/13 2345 02/13/13 0359 02/13/13 0748  GLUCAP 150* 146* 140*    Scheduled Meds: . antiseptic oral rinse  15 mL Mouth Rinse QID  . ceFEPime (MAXIPIME) IV  1 g Intravenous Q24H  . chlorhexidine  15 mL Mouth Rinse BID  . darbepoetin (ARANESP) injection - DIALYSIS  100 mcg Intravenous Q Sat-HD  . [START ON 02/14/2013] desmopressin (DDAVP) IV  20 mcg Intravenous Once  . [  START ON 02/14/2013] etomidate  40 mg Intravenous Once  . feeding supplement (VITAL AF 1.2 CAL)  1,000 mL Per Tube Q24H  . [START ON 02/14/2013] fentaNYL  200 mcg Intravenous Once  . heparin  5,000 Units Subcutaneous Q8H  . insulin aspart  0-9 Units Subcutaneous Q4H  . levalbuterol  0.63 mg Nebulization Q6H  . metoprolol tartrate  25 mg Per Tube BID  . [START ON 02/14/2013] midazolam  4 mg Intravenous Once  . pantoprazole sodium  40 mg Per Tube Q24H  . [START ON 02/14/2013] propofol  5-70 mcg/kg/min Intravenous Once  . sodium chloride  3 mL Intravenous Q12H    Continuous Infusions: . diltiazem (CARDIZEM) infusion 5 mg/hr (02/13/13 0427)  . propofol 15 mcg/kg/min (02/13/13 0227)    Kendell Bane RD, LDN, CNSC [phone] Pager [phone] After Hours Pager

## 2013-02-13 NOTE — Progress Notes (Signed)
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 Jearld Fenton) ,undergoing RT [name]) adm to [facility] 8/22 with confusion, dyspnea, hypoxia, hyperkalemia & AKI with cr 11/ BUN 101 - transferred to [facility] for rt IJ permacath placement then , emergent HD. PCCM consulted 8/22 pm for worsening dyspnea while on HD requiring bipap. CXR shows BL nodular infiltrates ? pna vs fluid  SIGNIFICANT EVENTS: 01/04/13 microlaryngoscopy - Jearld Fenton 8/22 Start HD, PCCM consulted for dyspnea, SVT 8/24 Failed BiPAP >> VDRF, ENT consulted  STUDIES:    LINES / TUBES: Rt IJ permacath (IR) 8/22 >> Lt IJ CVL 8/24 >> ETT 8/24 >>   CULTURES: resp c/s 8/22 >> BCx2 8/22 >> Trach asp 8/24>>  ANTIBIOTICS: Zithromax 8/22 >> 8/22 Aztreonam 8/22 >> 8/22 Ancef 8/22 >> 8/22 Rocephin 8/22 >> 8/22 Vancomycin 8/22 >> Cefepime 8/23 >>   SUBJECTIVE:  Tolerating pressure support.  For HD today.  VITAL SIGNS: Temp:  [97.5 F (36.4 C)-99 F (37.2 C)] 97.5 F (36.4 C) (08/25 0816) Pulse Rate:  [73-108] 89 (08/25 1000) Resp:  [9-25] 11 (08/25 1000) BP: (84-141)/(45-63) 138/55 mmHg (08/25 1000) SpO2:  [93 %-100 %] 96 % (08/25 1000) FiO2 (%):  [40 %-50 %] 40 % (08/25 1000) Weight:  [198 lb 3.1 oz (89.9 kg)] 198 lb 3.1 oz (89.9 kg) (08/25 0411) VENTILATOR SETTINGS: Vent Mode:  [-] CPAP;PSV FiO2 (%):  [40 %-50 %] 40 % Set Rate:  [22 bmp] 22 bmp Vt Set:  [440 mL-510 mL] 510 mL PEEP:  [5 cmH20-10 cmH20] 5 cmH20 Pressure Support:  [5 cmH20] 5 cmH20 Plateau Pressure:  [14 cmH20-20 cmH20] 19 cmH20 INTAKE / OUTPUT: Intake/Output     08/24 0701 - 08/25 0700 08/25 0701 - 08/26 0700   I.V. (mL/kg)  454.5 (5.1) 39 (0.4)   NG/GT 647 140   IV Piggyback 50    Total Intake(mL/kg) 1151.5 (12.8) 179 (2)   Urine (mL/kg/hr) 115 (0.1)    Stool 300 (0.1)    Total Output 415     Net +736.5 +179          PHYSICAL EXAMINATION: General: No distress Neuro: sedated  HEENT: ETT, OG tube in place Cardiovascular: regular, no murmur Lungs: b/l rhonchi Abdomen:  Soft, non tender Musculoskeletal:  No edema Skin:  No rash  LABS:  CBC Recent Labs     02/11/13  0520  02/12/13  0500  02/13/13  0420  WBC  6.0  10.8*  7.6  HGB  9.4*  9.9*  8.0*  HCT  28.4*  29.5*  24.9*  PLT  153  197  150   Coag's No results found for this basename: APTT, INR,  in the last 72 hours  BMET Recent Labs     02/11/13  0520  02/12/13  0500  02/13/13  0420  NA  142  141  138  K  5.3*  4.7  3.7  CL  106  102  101  CO2  20  23  21   BUN  88*  68*  93*  CREATININE  9.27*  7.26*  8.86*  GLUCOSE  126*  149*  159*   Electrolytes Recent Labs  02/10/13  1326  02/10/13  1800   02/11/13  0520  02/12/13  0500  02/13/13  0420  CALCIUM  8.1*   --    < >  8.3*  8.5  8.2*  MG   --    --    --    --   2.1   --   PHOS   --   8.4*   --    --    --    --    < > = values in this interval not displayed.   Sepsis Markers No results found for this basename: LACTICACIDVEN, PROCALCITON, O2SATVEN,  in the last 72 hours  ABG Recent Labs     02/12/13  0325  02/12/13  0535  02/12/13  1114  PHART  7.303*  7.348*  7.428  PCO2ART  48.4*  41.7  36.3  PO2ART  91.4  73.0*  218.0*   Liver Enzymes Recent Labs     02/11/13  0520  02/13/13  0420  AST  194*  58*  ALT  94*  10  ALKPHOS  54  60  BILITOT  0.2*  0.2*  ALBUMIN  2.2*  1.8*   Cardiac Enzymes No results found for this basename: TROPONINI, PROBNP,  in the last 72 hours  Glucose Recent Labs     02/12/13  1143  02/12/13  1611  02/12/13  1927  02/12/13  2345  02/13/13  0359  02/13/13  0748  GLUCAP  106*  136*  134*  150*  146*  140*     Imaging Dg Chest Port 1 View  02/13/2013   *RADIOLOGY REPORT*  Clinical Data: Evaluate endotracheal tube.  Pneumonia.  ARDS.  PORTABLE CHEST - 1 VIEW  Comparison: 02/12/2013.  Findings: Worsening pulmonary aeration is present.  Perihilar and basilar predominant airspace disease remains present, with increasing basilar atelectasis compared to the prior exam.  The support apparatus appears similar consisting of an endotracheal tube, enteric tube, left IJ central line with the tip in the left brachiocephalic vein, right IJ dialysis catheter.  Comparing yesterday's exam, the worsening aeration appears predominately due to lower lung volumes with the associated atelectasis.  IMPRESSION:  1.  Stable support apparatus. 2.  Lower lung volumes with increasing bilateral basilar atelectasis. 3.  Unchanged bilateral perihilar and basilar predominant airspace disease most compatible with volume overload/pulmonary edema.   Original Report Authenticated By: Andreas Newport, M.D.   Dg Chest Port 1 View  02/12/2013   *RADIOLOGY REPORT*  Clinical Data: Left jugular central venous catheter placement at bedside.  PORTABLE CHEST - 1 VIEW 02/12/2013 1018 hours:  Comparison: Portable chest x-ray earlier same date 0414 hours.  Findings: New left jugular central venous catheter tip projects over the upper SVC.  No evidence of pneumothorax mediastinal hematoma.  Right jugular dialysis catheter tips project over the upper and mid SVC, unchanged.  Endotracheal tube tip remains in satisfactory position approximately 4 cm above the carina. Nasogastric courses below the diaphragm into the stomach.  Cardiac silhouette normal in size for AP portable technique. Pulmonary vascularity normal.  Improved aeration in the lung bases since earlier in the morning.  Patchy airspace opacities persist throughout both lungs, however.  IMPRESSION:  1.  New left jugular central venous catheter tip projects over the upper SVC.  No acute complicating  features. 2.  Remaining support apparatus satisfactory. 3.  Improved aeration in the lung bases since earlier in the morning.  Persistent patchy airspace opacities  throughout both lungs consistent with pneumonia.   Original Report Authenticated By: Hulan Saas, M.D.   Dg Chest Port 1 View  02/12/2013   *RADIOLOGY REPORT*  Clinical Data: Endotracheal tube placement.  PORTABLE CHEST - 1 VIEW  Comparison: 02/10/2013  Findings: Interval placement of an endotracheal tube with tip measuring about 5.5 cm above the carina.  Stable appearance of right central venous catheter with tip in the cavoatrial junction. Borderline heart size.  Pulmonary vascularity is increasing since previous study.  There is increasing airspace disease in both lower lungs in the left mid lung.  Changes could represent increasing edema or pneumonia.  Calcification of the aorta.  No pneumothorax. No blunting of costophrenic angles.  IMPRESSION: Appliances appear in satisfactory position.  Increasing heart size and pulmonary vascular congestion with increasing airspace disease in the lungs suggesting increasing edema or pneumonia.   Original Report Authenticated By: Burman Nieves, M.D.     ASSESSMENT / PLAN:  PULMONARY A: Acute respiratory failure 2nd to aspiration pneumonia in setting of laryngeal cancer >> needs airway. P:   -pressure support wean as tolerated -arrange for percutaneous tracheostomy -f/u CXR after trach -continue BD's >> use xopenex in setting of tachyarrhythmia  CARDIOVASCULAR A: A fib with RVR >> improved, and now in sinus rhythm. Hx of HTN. P:  -wean off cardizem to keep HR < 110 -continue lopressor  RENAL A:  ESRD on HD. Hyperkalemia >> resolved. P:   -HD per renal  GASTROINTESTINAL A: Dysphagia. Nutrition. Hx of GERD. P:   -continue tube feeds -assess need for G tube after tracheostomy  HEMATOLOGIC A: Anemia of critical illness and chronic disease. Hx of laryngeal cancer s/p XRT. P:   -f/u CBC -transfuse for Hb < 7 -aranesp per renal -SQ heparin for DVT prevention  INFECTIOUS A:  HCAP, aspiration P:   -Continue cefepime/vanco  ENDOCRINE A:  DM-2   P:   -SSI  NEUROLOGIC A:  Encephalopathy d/t uremia and hypoxemia P:   -Sedation protocol while on vent  TODAY'S SUMMARY: Updated wife at bedside.  She is agreeable for plan of percutaneous tracheostomy >> tentatively scheduled for 8/26 at 10 am with Dr. Tyson Alias.  CC time 40 minutes.  Coralyn Helling, MD [facility] Pulmonary/Critical Care 02/13/2013, 10:49 AM Pager:  [phone] After 3pm call: [phone]

## 2013-02-13 NOTE — Progress Notes (Signed)
ANTIBIOTIC CONSULT NOTE - FOLLOW UP  Pharmacy Consult:  Vancomycin Indication: rule out pneumonia  Allergies  Allergen Reactions  . Amoxicillin     Causes upset stomach    Patient Measurements: Height: 5\' 10"  (177.8 cm) Weight: 201 lb 1 oz (91.2 kg) IBW/kg (Calculated) : 73  Vital Signs: Temp: 97.9 F (36.6 C) (08/25 1500) Temp src: Oral (08/25 1500) BP: 143/64 mmHg (08/25 1715) Pulse Rate: 101 (08/25 1715) Intake/Output from previous day: 08/24 0701 - 08/25 0700 In: 1151.5 [I.V.:454.5; NG/GT:647; IV Piggyback:50] Out: 415 [Urine:115; Stool:300] Intake/Output from this shift: Total I/O In: 432 [I.V.:72; NG/GT:360] Out: 150 [Urine:150]  Labs:  Recent Labs  02/11/13 0520 02/12/13 0500 02/13/13 0420  WBC 6.0 10.8* 7.6  HGB 9.4* 9.9* 8.0*  PLT 153 197 150  CREATININE 9.27* 7.26* 8.86*   Estimated Creatinine Clearance: 9.2 ml/min (by C-G formula based on Cr of 8.86).  Recent Labs  02/12/13 0535  VANCORANDOM 17.5     Microbiology: Recent Results (from the past 720 hour(s))  MRSA PCR SCREENING     Status: None   Collection Time    02/10/13  6:44 PM      Result Value Range Status   MRSA by PCR NEGATIVE  NEGATIVE Final   Comment:            The GeneXpert MRSA Assay (FDA     approved for NASAL specimens     only), is one component of a     comprehensive MRSA colonization     surveillance program. It is not     intended to diagnose MRSA     infection nor to guide or     monitor treatment for     MRSA infections.  CULTURE, BLOOD (ROUTINE X 2)     Status: None   Collection Time    02/10/13  7:28 PM      Result Value Range Status   Specimen Description BLOOD LEFT ARM   Final   Special Requests BOTTLES DRAWN AEROBIC AND ANAEROBIC 10CC   Final   Culture  Setup Time     Final   Value: 02/11/2013 01:15     Performed at [facility]   Culture     Final   Value:        BLOOD CULTURE RECEIVED NO GROWTH TO DATE CULTURE WILL BE HELD FOR 5 DAYS BEFORE  ISSUING A FINAL NEGATIVE REPORT     Performed at [facility]   Report Status PENDING   Incomplete  CULTURE, BLOOD (ROUTINE X 2)     Status: None   Collection Time    02/10/13  7:28 PM      Result Value Range Status   Specimen Description BLOOD LEFT ARM   Final   Special Requests BOTTLES DRAWN AEROBIC AND ANAEROBIC 10CC   Final   Culture  Setup Time     Final   Value: 02/11/2013 01:15     Performed at [facility]   Culture     Final   Value:        BLOOD CULTURE RECEIVED NO GROWTH TO DATE CULTURE WILL BE HELD FOR 5 DAYS BEFORE ISSUING A FINAL NEGATIVE REPORT     Performed at [facility]   Report Status PENDING   Incomplete  CULTURE, RESPIRATORY (NON-EXPECTORATED)     Status: None   Collection Time    02/12/13 11:33 AM      Result Value Range Status  Specimen Description TRACHEAL ASPIRATE   Final   Special Requests Normal   Final   Gram Stain     Final   Value: ABUNDANT WBC PRESENT, PREDOMINANTLY PMN     RARE SQUAMOUS EPITHELIAL CELLS PRESENT     NO ORGANISMS SEEN     Performed at [facility]   Culture     Final   Value: NO GROWTH     Performed at [facility]   Report Status PENDING   Incomplete      Assessment: [name] who continues on vancomycin and cefepime for r/o PNA, concerned with aspiration.  A random vancomycin level on 02/12/13 was therapeutic (VR = 17.5 mcg/mL, goal of 15-25 mcg/mL). The patient is noted to have some residual UOP -- total of 0.1 ml/kg/hr over the past 24 hours.  He is tolerating his HD session today, 02/13/13, at a BFR of 300 for a total of 3.5 hours.   Goal of Therapy:  Pre-HD Vancomycin trough of 15-25 mcg/ml Proper antibiotics for infection/cultures adjusted for renal/hepatic function    Plan:  - Vanc 1gm IV x 1 (no standing vanc order for now) - Continue Cefepime 1gm IV Q24H - Follow HD schedule / duration / tolerance - Monitor micro data, vanc levels    Jenasia Dolinar D. Laney Potash, PharmD, BCPS Pager:   [phone] 02/13/2013, 5:34 PM

## 2013-02-13 NOTE — Progress Notes (Signed)
eLink Physician-Brief Progress Note Patient Name: Victor Lane DOB: [birth year] MRN: [id number]  Date of Service  02/13/2013   HPI/Events of Note  Call from nurse reporting that the patient appears to be in pain, grimacing on vent.  No pain meds ordered on propofol gtt.   eICU Interventions  Plan: Fentanyl 25 to 100 mcg IV q2 hours prn pain   Intervention Category Minor Interventions: Routine modifications to care plan (e.g. PRN medications for pain, fever)  DETERDING,ELIZABETH 02/13/2013, 12:07 AM

## 2013-02-13 NOTE — Procedures (Signed)
I was present at this dialysis session. I have reviewed the session itself and made appropriate changes.   Vinson Moselle  MD Pager [phone]    Cell  [phone] 02/13/2013, 4:03 PM

## 2013-02-13 NOTE — Progress Notes (Signed)
Renal Service Daily Progress Note  Subjective: On vent, awakens easily and follow simple commands  Physical Exam:  Blood pressure 131/55, pulse 91, temperature 98.4 F (36.9 C), temperature source Oral, resp. rate 10, height 5\' 10"  (1.778 m), weight 89.9 kg (198 lb 3.1 oz), SpO2 98.00%. Gen: on vent, arouses easily Heart: RRR no M or rub Lungs: clear bilat ant and lat Abd: nondistended, nontender, no ascites Ext: no edema Access:  R IJ TDC by IR on 8/22    Assessment: 1. ESRD- new start to HD, uremia improved > 3rd HD today  2. HTN/vol- euvolemic, keep even w HD 3. Anemia- started esa, no Fe d/t ^ferritin 4. VDRF / hx laryngeal Ca- for trach 5. AMS- improved with HD  6. HCAP- on maxipime/vanc, suspected aspiration 7. Laryngeal Ca- on XRT pta 8. Afib / RVR- on diltiazem drip, BB  Vinson Moselle  MD Pager [phone]    Cell  [phone] 02/13/2013, 12:39 PM    Recent Labs Lab 02/10/13 1326 02/10/13 1800  02/11/13 0520 02/12/13 0500 02/13/13 0420  NA 137  --   < > 142 141 138  K 6.9*  --   < > 5.3* 4.7 3.7  CL 103  --   < > 106 102 101  CO2 17*  --   < > 20 23 21   GLUCOSE 217*  --   < > 126* 149* 159*  BUN 100*  --   < > 88* 68* 93*  CREATININE 10.44*  --   < > 9.27* 7.26* 8.86*  CALCIUM 8.1*  --   < > 8.3* 8.5 8.2*  PHOS  --  8.4*  --   --   --   --   < > = values in this interval not displayed.  Recent Labs Lab 02/10/13 1000 02/11/13 0520 02/13/13 0420  AST 190* 194* 58*  ALT 155* 94* 10  ALKPHOS 84 54 60  BILITOT 0.3 0.2* 0.2*  PROT 6.9 5.4* 5.0*  ALBUMIN 2.7* 2.2* 1.8*    Recent Labs Lab 02/10/13 1000 02/11/13 0520 02/12/13 0500 02/13/13 0420  WBC 9.2 6.0 10.8* 7.6  NEUTROABS 8.0*  --   --  6.0  HGB 11.5* 9.4* 9.9* 8.0*  HCT 35.7* 28.4* 29.5* 24.9*  MCV 90.8 88.8 89.1 90.5  PLT 211 153 197 150   . antiseptic oral rinse  15 mL Mouth Rinse QID  . ceFEPime (MAXIPIME) IV  1 g Intravenous Q24H  . chlorhexidine  15 mL Mouth Rinse BID  .  darbepoetin (ARANESP) injection - DIALYSIS  100 mcg Intravenous Q Sat-HD  . [START ON 02/14/2013] desmopressin (DDAVP) IV  20 mcg Intravenous Once  . [START ON 02/14/2013] etomidate  40 mg Intravenous Once  . feeding supplement (VITAL AF 1.2 CAL)  1,000 mL Per Tube Q24H  . [START ON 02/14/2013] fentaNYL  200 mcg Intravenous Once  . heparin  5,000 Units Subcutaneous Q8H  . insulin aspart  0-9 Units Subcutaneous Q4H  . levalbuterol  0.63 mg Nebulization Q6H  . metoprolol tartrate  25 mg Per Tube BID  . [START ON 02/14/2013] midazolam  4 mg Intravenous Once  . pantoprazole sodium  40 mg Per Tube Q24H  . [START ON 02/14/2013] propofol  5-70 mcg/kg/min Intravenous Once  . sodium chloride  3 mL Intravenous Q12H   . diltiazem (CARDIZEM) infusion 5 mg/hr (02/13/13 0427)  . propofol 7.5 mcg/kg/min (02/13/13 1114)   fentaNYL, metoprolol, ondansetron (ZOFRAN) IV

## 2013-02-14 ENCOUNTER — Ambulatory Visit: Payer: Medicare Other

## 2013-02-14 ENCOUNTER — Inpatient Hospital Stay (HOSPITAL_COMMUNITY): Payer: Medicare Other

## 2013-02-14 DIAGNOSIS — C320 Malignant neoplasm of glottis: Secondary | ICD-10-CM

## 2013-02-14 LAB — CBC
MCH: 30.4 pg (ref 26.0–34.0)
MCHC: 33.3 g/dL (ref 30.0–36.0)
Platelets: 137 10*3/uL — ABNORMAL LOW (ref 150–400)
RDW: 14.9 % (ref 11.5–15.5)

## 2013-02-14 LAB — GLUCOSE, CAPILLARY
Glucose-Capillary: 136 mg/dL — ABNORMAL HIGH (ref 70–99)
Glucose-Capillary: 134 mg/dL — ABNORMAL HIGH (ref 70–99)

## 2013-02-14 LAB — BASIC METABOLIC PANEL
Calcium: 7.5 mg/dL — ABNORMAL LOW (ref 8.4–10.5)
Creatinine, Ser: 5.22 mg/dL — ABNORMAL HIGH (ref 0.50–1.35)
GFR calc non Af Amer: 10 mL/min — ABNORMAL LOW (ref >90)
Sodium: 138 mEq/L (ref 135–145)

## 2013-02-14 MED ORDER — SODIUM CHLORIDE 0.9 % IV SOLN
25.0000 ug/h | INTRAVENOUS | Status: DC
  Administered 2013-02-14: 50 ug/h via INTRAVENOUS
  Filled 2013-02-14 (×2): qty 50

## 2013-02-14 MED ORDER — FENTANYL CITRATE 0.05 MG/ML IJ SOLN
25.0000 ug | INTRAMUSCULAR | Status: DC | PRN
  Administered 2013-02-14 (×2): 50 ug via INTRAVENOUS
  Filled 2013-02-14 (×2): qty 2

## 2013-02-14 MED ORDER — VECURONIUM BROMIDE 10 MG IV SOLR
INTRAVENOUS | Status: AC
  Administered 2013-02-14: 7 mg via INTRAVENOUS
  Filled 2013-02-14: qty 20

## 2013-02-14 NOTE — Procedures (Signed)
Bedside Tracheostomy Insertion Procedure Note   Patient Details:   Name: Victor Lane DOB: 21-Jul-1945 MRN: [id number]  Procedure: Tracheostomy  Pre Procedure Assessment: ET Tube Size:7.5 ET Tube secured at lip (cm): 26 Bite block in place: No Breath Sounds: Clear  Post Procedure Assessment: BP 152/59  Pulse 97  Temp(Src) 97.5 F (36.4 C) (Oral)  Resp 31  Ht 5\' 10"  (1.778 m)  Wt 198 lb 6.6 oz (90 kg)  BMI 28.47 kg/m2  SpO2 100% O2 sats: stable throughout Complications: No apparent complications Patient did tolerate procedure well Tracheostomy Brand:Shiley Tracheostomy Style:Cuffed Tracheostomy Size: 8.0 Tracheostomy Secured WUJ:[id number], velcro Tracheostomy Placement Confirmation:Trach cuff visualized and in place    Jacqulynn Cadet 02/14/2013, 11:20 AM

## 2013-02-14 NOTE — Procedures (Signed)
Procedure done by Anders Simmonds ACNP and Clayton Lefort MD. At first bronch was introduce through ET tube and structures of tracheal rings, carina identified for operator of tracheostomy who was Dr Tyson Alias. Light of bronch passed through trachea and skin for indentification of tracheal rings for tracheostomy puncture. After this, under bronchoscopy guidance,  ET tube was pulled back sufficiently and very carefully to 19 cm from initial 26cm placement. The ET tube was  pulled back enough to give room for tracheostomy operator and yet at same time to to ensure a secured airway. After this was accomplished, bronchoscope was withdrawn into the ET tube. After this,  Dr Tyson Alias then performed tracheostomy under video visual provided by flexible video bronchoscopy. Followng introduction of tracheostomy,  the bronchoscope was removed from ET tube and introduced through tracheostomy. Correct position of tracheostomy was ensured, with enough room between carina and distal tracheostomy and no evidence of bleeding. The bronchoscope was then withdrawn. Respiratory therapist was then instructed to remove the ET tube.  Dr Tyson Alias then proceeded to complete the tracheostomy with stay sutures  Airways inspected. RUL/RML/RLL: no lesions. LLL. LUL: unremarkable. Upper airway showed tumor growth just below vocal cords on anterior tracheal wall, and vocal cord edema.  No complications   Coralyn Helling, MD [facility] Pulmonary/Critical Care 02/14/2013, 12:03 PM Pager:  [phone] After 3pm call: [phone]

## 2013-02-14 NOTE — Care Management Note (Unsigned)
Page 1 of 2   02/25/2013     4:29:56 PM   CARE MANAGEMENT NOTE 02/25/2013  Patient:  Victor Lane, Victor Lane   Account Number:  [id number]  Date Initiated:  02/13/2013  Documentation initiated by:  Junius Creamer  Subjective/Objective Assessment:   adm w emerg hemodialysis, on vent     Action/Plan:   lives w wife, pcp dr Molly Maduro freid   Anticipated DC Date:  02/24/2013   Anticipated DC Plan:  HOME W HOME HEALTH SERVICES      DC Planning Services  CM consult      [facility] Choice  HOME HEALTH   Choice offered to / List presented to:  C-1 Patient   DME arranged  TRACH SUPPLIES  WALKER - ROLLING  BEDSIDE COMMODE  TUBE FEEDING  TUBE FEEDING PUMP  OXYGEN      DME agency  [facility]     HH arranged  HH-1 RN  HH-2 PT  HH-3 OT  HH-5 SPEECH THERAPY      HH agency  [facility]   Status of service:  Completed, signed off Medicare Important Message given?   (If response is "NO", the following Medicare IM given date fields will be blank) Date Medicare IM given:   Date Additional Medicare IM given:    Discharge Disposition:  HOME W HOME HEALTH SERVICES  Per UR Regulation:  Reviewed for med. necessity/level of care/duration of stay  If discussed at Long Length of Stay Meetings, dates discussed:   02/16/2013    Comments:  02/25/13 CM called [facility] to inform of discharge.  Equipment brought to room by [facility] prior to discharge.  No other CM needs communicated.  Freddy Jaksch, BSN, CM [phone].  02-24-13 2:40pm Avie Arenas, RNBSN [phone] Dialysis ok with trach - talked with Dr. Hyman Hopes and Dialysis center.  PCCM updated.  Working on time for dialysis - needs to be Monday - Cannot discharge until we have a time for dialysis.  Dr. Hyman Hopes working on and will call  me back. Verified HH set up.  Does not need oxygen - just humidifed trach collar 21% at night.  HH updated.  TF now just bolus during day not at night - again [facility] updated. Also updated HH that we are hoping for  this w/e discharge if can get dailysis OP set up.  Dialysis time set for Monday at 10:30am to do paperwork and then dialysis per Dr. Hyman Hopes.  Anders Simmonds updated.  02/23/13 JULIE AMERSON,RN,BSN [id number] NOTIFIED BY JUDY IN HD THAT  [facility] HAS DECLINED PT FOR OP DIALYSIS.  HER RECOMMENDATION IS TO HAVE DR WEBB SPEAK WITH [facility]ICAL [facility] IN AM, AS CURRENTLY TRACH IS CAPPED, HE IS ON NO OXYGEN, AND IS REQUIRING NO SUCTIONING OR DRESSING CHANGES.(CONFIRMED WITH Koleen Nimrod, BEDSIDE RN).  HOPEFUL THAT THIS WILL CHANGE AFTER MD TO MD CONVERSATION.  WILL FOLLOW UP.  02/23/13 JULIE AMERSON,RN,BSN PER [facility], INSURANCE WILL NOT PAY FOR TUBE FEEDING IF ONLY NEEDED FOR MEETING <75% OF PT'S NUTRITIONAL NEEDS.  OPTION IS TO DO BOLUS FEEDS WITH CANS OF NEPRO AND PRIVATE PAY BY THE CASE.  SPOKE WITH DIETICIAN, AND SHE WILL LEAVE RECOMMENDATIONS FOR BOLUS FEEDS.  WIFE IS OKAY WITH PRIVATE PAYING FOR TUBE FEEDING IF NEEDED.  "WHATEVER HE NEEDS."  02/23/13 JULIE AMERSON,RN,BSN [phone] ORDERED ALL DME, TRACH SUPPLIES, AND TUBE FEEDING TODAY, AS WELL AS HOME HEALTH.  PLAN TO DELIVER ALL EQUIPMENT TO PT HOME  THIS EVENING.  WIFE STATES PT WILL NOT NEED HOSP BED AS ORIGINALLY THOUGHT.  PT ON ROOM AIR, AND SATS ARE WNL; WILL NOT QUALIFY FOR HOME OXYGEN.  02/21/13 JULIE AMERSON,RN,BSN [phone] OP DIALYSIS CENTERS WILL ONLY TAKE PTS WITH CAPPED TRACHS. DISCUSSED THIS PROBLEM AT LENGTH WITH CCM NURSE PRACTITIONER-DECISION MADE TO DOWNSIZE TRACH TODAY AND BEGIN CAPPING TRIALS IN AN ATTEMPT TO GET PT TO A POINT THAT HE CAN TOLERATE TRACH BEING CAPPED DURING HD.  MET WITH PT AND WIFE AND EXPLAINED THIS IN DETAIL.  02/17/13 JULIE AMERSON,RN,BSN [phone] MET WITH PT, WIFE AND SISTER TO DISCUSS DC PLANS.  PT WANTS TO GO HOME, AND WIFE IS ABLE/WILLING TO PROVIDE CARE AT DC. WIFE HAS STARTED WATCHING NURSES PROVIDE TRACH CARE, AND 'HAS STARTED TAKING NOTES."  SHE STATES SHE IS OVERWHELMED, BUT WANTS TO DO THIS.  THEY ARE  AGREEABLE THAT PT WILL NEED A HOSPITAL BED; WILL LIKELY NEED BSC, RW.  PT WILL NEED ORDERS FOR TRACH SUPPLIES, HOME O2 AND SUCTION SET UP. WILL PROVIDE MORE THOROUGH LIST WHEN CLOSER TO DC.  PT WILL ALSO NEED TUBE FEEDING AND TUBE FEEDING PUMP FOR HOME. LIKELY TO NEED TRANPORT HOME BY NON-EMERGENT AMBULANCE. RESPIRATORY THERAPY TO BEGIN TRACH TEACHING WITH WIFE ASAP. REFERRAL TO [facility] TO BEGIN PROCESS FOR NEW [facility] HOME DC. WILL FOLLOW PROGRESS; FOLLOW UP WITH MD FOR ORDERS. ENCOURAGE BEDSIDE NURSES TO PROVIDE TRACH AND TUBE FEEDING TEACHING TO WIFE WHEN SHE IS AVAILABLE.  02/16/13 JULIE AMERSON,RN,BSN [phone] PT/OT CONSULTS PENDING.  NOTED PLANNING G-TUBE PLACEMENT IN IR FOR NUTRITIONAL NEEDS.  PT WITH NEW TRACH; INSTRUCTED BEDSIDE NURSE TO BEGIN TRACH CARE TEACHING WITH WIFE.  PT IS ELIGIBLE FOR LTAC AT THIS TIME; PLEASE CONSIDER LTAC CONSULT IF YOU FEEL PT WILL NEED THIS PRIOR TO RETURN HOME. WILL FOLLOW UP WITH PT/WIFE.  8/26 1633p debbie dowell rn,bsn met w mrs Settlemyre to discuss role of cm. pt got trach placed today. discussed ltac-hhc-snf depending on pt's needs. wife hopes pt can get home at disch but needs teaching. will assist as pt progresses.

## 2013-02-14 NOTE — Procedures (Signed)
Perc trach 8 placed shiley Blood loss less 1 cc See full dictation  Mcarthur Rossetti. Tyson Alias, MD, FACP Pgr: [phone] [facility]

## 2013-02-14 NOTE — Progress Notes (Signed)
Renal Service Daily Progress Note  Subjective: unresponsive, s/p trach today, on propofol  Physical Exam:  Blood pressure 152/59, pulse 97, temperature 97.5 F (36.4 C), temperature source Oral, resp. rate 31, height 5\' 10"  (1.778 m), weight 90 kg (198 lb 6.6 oz), SpO2 100.00%. Gen: on vent, does not awaken, sedated Heart: RRR no M or rub Lungs: clear bilat ant and lat Abd: nondistended, nontender, no ascites Ext: no edema Access:  R IJ TDC   Assessment: 1. CKD, new ESRD- new start to HD, permcath IR 8/22 2. HTN/vol- up 4kg from admit 3. Anemia- Hb 7.7, transfuse prn, s/p darbe 100ug 8/23, no Fe d/t ^ferritin 4. VDRF / hx laryngeal Ca- for trach 5. AMS- on propofol, will prob be extubated tomorrow per ccm 6. HCAP- on maxipime/vanc, suspected aspiration 7. Laryngeal Ca- on XRT pta 8. Afib / RVR- on diltiazem drip, BB 9. Nutrition- alb 1.8  Plan- HD tomorrow, Franchot Erichsen  MD Pager [phone]    Cell  [phone] 02/14/2013, 11:19 AM    Recent Labs Lab 02/10/13 1326 02/10/13 1800  02/12/13 0500 02/13/13 0420 02/14/13 0500  NA 137  --   < > 141 138 138  K 6.9*  --   < > 4.7 3.7 3.6  CL 103  --   < > 102 101 100  CO2 17*  --   < > 23 21 29   GLUCOSE 217*  --   < > 149* 159* 119*  BUN 100*  --   < > 68* 93* 51*  CREATININE 10.44*  --   < > 7.26* 8.86* 5.22*  CALCIUM 8.1*  --   < > 8.5 8.2* 7.5*  PHOS  --  8.4*  --   --   --   --   < > = values in this interval not displayed.  Recent Labs Lab 02/10/13 1000 02/11/13 0520 02/13/13 0420  AST 190* 194* 58*  ALT 155* 94* 10  ALKPHOS 84 54 60  BILITOT 0.3 0.2* 0.2*  PROT 6.9 5.4* 5.0*  ALBUMIN 2.7* 2.2* 1.8*    Recent Labs Lab 02/10/13 1000  02/12/13 0500 02/13/13 0420 02/14/13 0500  WBC 9.2  < > 10.8* 7.6 6.4  NEUTROABS 8.0*  --   --  6.0  --   HGB 11.5*  < > 9.9* 8.0* 7.7*  HCT 35.7*  < > 29.5* 24.9* 23.1*  MCV 90.8  < > 89.1 90.5 91.3  PLT 211  < > 197 150 137*  < > = values in this interval  not displayed. Marland Kitchen antiseptic oral rinse  15 mL Mouth Rinse QID  . ceFEPime (MAXIPIME) IV  1 g Intravenous Q24H  . chlorhexidine  15 mL Mouth Rinse BID  . darbepoetin (ARANESP) injection - DIALYSIS  100 mcg Intravenous Q Sat-HD  . etomidate  40 mg Intravenous Once  . feeding supplement (NEPRO CARB STEADY)  1,000 mL Per Tube Q24H  . feeding supplement  30 mL Per Tube TID  . heparin  5,000 Units Subcutaneous Q8H  . insulin aspart  0-9 Units Subcutaneous Q4H  . levalbuterol  0.63 mg Nebulization Q6H  . metoprolol tartrate  25 mg Per Tube BID  . pantoprazole sodium  40 mg Per Tube Q24H  . propofol  5-70 mcg/kg/min Intravenous Once  . sodium chloride  3 mL Intravenous Q12H  . vancomycin  1,000 mg Intravenous Once   . diltiazem (CARDIZEM) infusion 5 mg/hr (02/14/13 0420)  .  propofol 25 mcg/kg/min (02/14/13 1046)   fentaNYL, heparin, heparin, metoprolol, ondansetron (ZOFRAN) IV

## 2013-02-14 NOTE — Progress Notes (Signed)
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 Jearld Fenton) ,undergoing RT [name]) adm to [facility] 8/22 with confusion, dyspnea, hypoxia, hyperkalemia & AKI with cr 11/ BUN 101 - transferred to [facility] for rt IJ permacath placement then , emergent HD. PCCM consulted 8/22 pm for worsening dyspnea while on HD requiring bipap. CXR shows BL nodular infiltrates ? pna vs fluid  SIGNIFICANT EVENTS: 01/04/13 microlaryngoscopy - Jearld Fenton 8/22 Start HD, PCCM consulted for dyspnea, SVT 8/24 Failed BiPAP >> VDRF, ENT consulted 8/26 Perc trach by DF >> #8 trach  STUDIES:    LINES / TUBES: Rt IJ permacath (IR) 8/22 >> Lt IJ CVL 8/24 >> ETT 8/24 >> 8/26 Trach (DF) 8/26 >>  CULTURES: resp c/s 8/22 >> BCx2 8/22 >> Trach asp 8/24>>  ANTIBIOTICS: Zithromax 8/22 >> 8/22 Aztreonam 8/22 >> 8/22 Ancef 8/22 >> 8/22 Rocephin 8/22 >> 8/22 Vancomycin 8/22 >> Cefepime 8/23 >>   SUBJECTIVE:  Had trach earlier this AM.  VITAL SIGNS: Temp:  [97.5 F (36.4 C)-98.4 F (36.9 C)] 97.5 F (36.4 C) (08/26 0830) Pulse Rate:  [47-124] 78 (08/26 1130) Resp:  [9-31] 28 (08/26 1130) BP: (81-158)/(42-100) 116/59 mmHg (08/26 1130) SpO2:  [96 %-100 %] 100 % (08/26 1130) FiO2 (%):  [40 %-100 %] 40 % (08/26 1130) Weight:  [198 lb 6.6 oz (90 kg)-201 lb 1 oz (91.2 kg)] 198 lb 6.6 oz (90 kg) (08/26 0500) VENTILATOR SETTINGS: Vent Mode:  [-] PRVC FiO2 (%):  [40 %-100 %] 40 % Set Rate:  [14 bmp-28 bmp] 28 bmp Vt Set:  [510 mL-580 mL] 580 mL PEEP:  [5 cmH20] 5 cmH20 Pressure Support:  [8 cmH20] 8 cmH20 Plateau Pressure:  [13 cmH20-17 cmH20] 17 cmH20 INTAKE / OUTPUT: Intake/Output   08/25 0701 - 08/26 0700 08/26 0701 - 08/27 0700   I.V. (mL/kg) 419.9 (4.7) 84.8 (0.9)   Other 25    NG/GT 681.8    IV Piggyback     Total Intake(mL/kg) 1126.8 (12.5) 84.8 (0.9)   Urine (mL/kg/hr) 200 (0.1)    Stool     Total Output 200     Net +926.8 +84.8        Stool Occurrence 1 x      PHYSICAL EXAMINATION: General: No distress Neuro: sedated  HEENT: trach, OG tube in place Cardiovascular: regular, no murmur Lungs: b/l rhonchi Abdomen:  Soft, non tender Musculoskeletal:  No edema Skin:  No rash  LABS:  CBC Recent Labs     02/12/13  0500  02/13/13  0420  02/14/13  0500  WBC  10.8*  7.6  6.4  HGB  9.9*  8.0*  7.7*  HCT  29.5*  24.9*  23.1*  PLT  197  150  137*   Coag's Recent Labs     02/13/13  1100  APTT  30  INR  1.10    BMET Recent Labs     02/12/13  0500  02/13/13  0420  02/14/13  0500  NA  141  138  138  K  4.7  3.7  3.6  CL  102  101  100  CO2  23  21  29  BUN  68*  93*  51*  CREATININE  7.26*  8.86*  5.22*  GLUCOSE  149*  159*  119*   Electrolytes Recent Labs     02/12/13  0500  02/13/13  0420  02/14/13  0500  CALCIUM  8.5  8.2*  7.5*  MG  2.1   --    --    ABG Recent Labs     02/12/13  0325  02/12/13  0535  02/12/13  1114  PHART  7.303*  7.348*  7.428  PCO2ART  48.4*  41.7  36.3  PO2ART  91.4  73.0*  218.0*   Liver Enzymes Recent Labs     02/13/13  0420  AST  58*  ALT  10  ALKPHOS  60  BILITOT  0.2*  ALBUMIN  1.8*   Glucose Recent Labs     02/13/13  1547  02/13/13  1828  02/13/13  2014  02/13/13  2332  02/14/13  0341  02/14/13  0755  GLUCAP  130*  137*  211*  141*  120*  119*    Imaging Chest Portable 1 View To Assess Tube Placement And Rule-out Pneumothorax  02/14/2013   CLINICAL DATA:  Postop from tracheostomy tube placement. Acute respiratory failure.  EXAM: PORTABLE CHEST - 1 VIEW  COMPARISON:  02/13/2013  FINDINGS: A new tracheostomy tube is seen in appropriate position. Nasogastric tube and right  internal jugular dual-lumen central venous catheters remain in appropriate position.  Mild bibasilar infiltrates or atelectasis show no significant change.  IMPRESSION: New tracheostomy tube in appropriate position. Bibasilar infiltrates or atelectasis, without significant change.   Electronically Signed   By: Myles Rosenthal   On: 02/14/2013 11:04   Dg Chest Port 1 View  02/13/2013   *RADIOLOGY REPORT*  Clinical Data: Evaluate endotracheal tube.  Pneumonia.  ARDS.  PORTABLE CHEST - 1 VIEW  Comparison: 02/12/2013.  Findings: Worsening pulmonary aeration is present.  Perihilar and basilar predominant airspace disease remains present, with increasing basilar atelectasis compared to the prior exam.  The support apparatus appears similar consisting of an endotracheal tube, enteric tube, left IJ central line with the tip in the left brachiocephalic vein, right IJ dialysis catheter.  Comparing yesterday's exam, the worsening aeration appears predominately due to lower lung volumes with the associated atelectasis.  IMPRESSION:  1.  Stable support apparatus. 2.  Lower lung volumes with increasing bilateral basilar atelectasis. 3.  Unchanged bilateral perihilar and basilar predominant airspace disease most compatible with volume overload/pulmonary edema.   Original Report Authenticated By: Andreas Newport, M.D.     ASSESSMENT / PLAN:  PULMONARY A: Acute respiratory failure 2nd to aspiration pneumonia in setting of laryngeal cancer >> needs airway; s/p trach 8/26. P:   -full vent support for now >> plan to push to trach collar 8/27 -f/u CXR  -continue BD's >> use xopenex in setting of tachyarrhythmia  CARDIOVASCULAR A: A fib with RVR >> improved, and now in sinus rhythm. Hx of HTN. P:  -wean off cardizem to keep HR < 110 -continue lopressor  RENAL A:  ESRD on HD. Hyperkalemia >> resolved. P:   -HD per renal  GASTROINTESTINAL A: Dysphagia. Nutrition. Hx of GERD. P:   -continue tube feeds -assess  need for G tube after tracheostomy  HEMATOLOGIC A: Anemia of critical illness and chronic disease. Hx of laryngeal cancer s/p XRT. P:  -f/u CBC -transfuse for Hb < 7 -aranesp per renal -SQ heparin for DVT prevention  INFECTIOUS A:  HCAP, aspiration P:   -D5/x cefepime/vanco  ENDOCRINE A:  DM-2   P:   -SSI  NEUROLOGIC A:  Encephalopathy d/t uremia and hypoxemia >> improved P:   -Sedation protocol while on vent  TODAY'S SUMMARY: Updated wife at bedside.  CC time 40 minutes.  Coralyn Helling, MD [facility] Pulmonary/Critical Care 02/14/2013, 11:49 AM Pager:  [phone] After 3pm call: [phone]

## 2013-02-15 ENCOUNTER — Inpatient Hospital Stay (HOSPITAL_COMMUNITY): Payer: Medicare Other

## 2013-02-15 ENCOUNTER — Ambulatory Visit: Payer: Medicare Other

## 2013-02-15 DIAGNOSIS — I10 Essential (primary) hypertension: Secondary | ICD-10-CM

## 2013-02-15 LAB — CBC
HCT: 25.5 % — ABNORMAL LOW (ref 39.0–52.0)
Hemoglobin: 8.2 g/dL — ABNORMAL LOW (ref 13.0–17.0)
MCHC: 32.2 g/dL (ref 30.0–36.0)
RDW: 14.6 % (ref 11.5–15.5)
WBC: 9.3 10*3/uL (ref 4.0–10.5)

## 2013-02-15 LAB — GLUCOSE, CAPILLARY
Glucose-Capillary: 116 mg/dL — ABNORMAL HIGH (ref 70–99)
Glucose-Capillary: 118 mg/dL — ABNORMAL HIGH (ref 70–99)
Glucose-Capillary: 151 mg/dL — ABNORMAL HIGH (ref 70–99)

## 2013-02-15 LAB — VANCOMYCIN, RANDOM: Vancomycin Rm: 20.6 ug/mL

## 2013-02-15 LAB — BASIC METABOLIC PANEL
Chloride: 99 mEq/L (ref 96–112)
Creatinine, Ser: 6.54 mg/dL — ABNORMAL HIGH (ref 0.50–1.35)
GFR calc Af Amer: 9 mL/min — ABNORMAL LOW (ref >90)
GFR calc non Af Amer: 8 mL/min — ABNORMAL LOW (ref >90)
Potassium: 3.7 mEq/L (ref 3.5–5.1)

## 2013-02-15 LAB — CULTURE, RESPIRATORY W GRAM STAIN

## 2013-02-15 MED ORDER — SODIUM CHLORIDE 0.9 % IV SOLN: 100.0000 mL | INTRAVENOUS | Status: DC | PRN

## 2013-02-15 MED ORDER — HEPARIN SODIUM (PORCINE) 1000 UNIT/ML DIALYSIS: 1000.0000 [IU] | INTRAMUSCULAR | Status: DC | PRN

## 2013-02-15 MED ORDER — METOPROLOL TARTRATE 1 MG/ML IV SOLN
2.5000 mg | Freq: Four times a day (QID) | INTRAVENOUS | Status: DC
  Administered 2013-02-15 – 2013-02-16 (×6): 2.5 mg via INTRAVENOUS
  Filled 2013-02-15 (×7): qty 5

## 2013-02-15 MED ORDER — LIDOCAINE HCL (PF) 1 % IJ SOLN: 5.0000 mL | INTRAMUSCULAR | Status: DC | PRN

## 2013-02-15 MED ORDER — LIDOCAINE-PRILOCAINE 2.5-2.5 % EX CREA
1.0000 "application " | TOPICAL_CREAM | CUTANEOUS | Status: DC | PRN
  Filled 2013-02-15: qty 5

## 2013-02-15 MED ORDER — VANCOMYCIN HCL IN DEXTROSE 1-5 GM/200ML-% IV SOLN
1000.0000 mg | Freq: Once | INTRAVENOUS | Status: DC
  Administered 2013-02-15: 1000 mg via INTRAVENOUS
  Filled 2013-02-15: qty 200

## 2013-02-15 MED ORDER — LABETALOL HCL 5 MG/ML IV SOLN
10.0000 mg | INTRAVENOUS | Status: DC | PRN
  Administered 2013-02-15 (×2): 10 mg via INTRAVENOUS
  Filled 2013-02-15 (×4): qty 4

## 2013-02-15 MED ORDER — FUROSEMIDE 10 MG/ML IJ SOLN
120.0000 mg | Freq: Once | INTRAVENOUS | Status: AC
  Administered 2013-02-15: 120 mg via INTRAVENOUS
  Filled 2013-02-15: qty 12

## 2013-02-15 MED ORDER — ALTEPLASE 2 MG IJ SOLR: 2.0000 mg | Freq: Once | INTRAMUSCULAR | Status: AC | PRN

## 2013-02-15 MED ORDER — DEXTROSE 5 % IV SOLN
2.0000 g | INTRAVENOUS | Status: DC
  Administered 2013-02-15: 2 g via INTRAVENOUS
  Filled 2013-02-15: qty 2

## 2013-02-15 MED ORDER — FENTANYL CITRATE 0.05 MG/ML IJ SOLN
12.5000 ug | INTRAMUSCULAR | Status: DC | PRN
Start: 2013-02-15 — End: 2013-02-16

## 2013-02-15 MED ORDER — LEVALBUTEROL HCL 0.63 MG/3ML IN NEBU
0.6300 mg | INHALATION_SOLUTION | RESPIRATORY_TRACT | Status: DC | PRN
  Administered 2013-02-17 – 2013-02-19 (×2): 0.63 mg via RESPIRATORY_TRACT
  Filled 2013-02-15 (×2): qty 3

## 2013-02-15 MED ORDER — NEPRO/CARBSTEADY PO LIQD: 237.0000 mL | ORAL | Status: DC | PRN

## 2013-02-15 MED ORDER — PANTOPRAZOLE SODIUM 40 MG IV SOLR
40.0000 mg | INTRAVENOUS | Status: DC
  Administered 2013-02-15 – 2013-02-16 (×2): 40 mg via INTRAVENOUS
  Filled 2013-02-15 (×2): qty 40

## 2013-02-15 MED ORDER — PENTAFLUOROPROP-TETRAFLUOROETH EX AERO: 1.0000 "application " | INHALATION_SPRAY | CUTANEOUS | Status: DC | PRN

## 2013-02-15 NOTE — Evaluation (Signed)
Passy-Muir Speaking Valve - Evaluation Patient Details  Name: Victor Lane MRN: [id number] Date of Birth: 07-Oct-1945  Today's Date: 02/15/2013 Time: 0956-     Past Medical History:  Past Medical History  Diagnosis Date  . Diabetes mellitus   . Hypertension   . GERD (gastroesophageal reflux disease)   . Fibromyalgia   . Chest pain   . Carotid artery occlusion   . Chronic kidney disease     - Stage 5- follwed by Dr Hyman Hopes  . Complication of anesthesia     agiation when he awaken  . Neuropathy   . Arthritis   . Sleep apnea   . H/O vitamin D deficiency   . Hearing loss   . Hypercholesteremia   . Myalgia and myositis   . 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 Cholecystectomy  1993  . Rotator cuff repair  07/06/2011    right  . Carotid endarterectomy Left   . Coolonoscopy    . Colonoscopy w/ biopsies and polypectomy      Hx: of  . 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 Jearld Fenton, final pathology report 01/04/2013) currently undergoing radiation therapy under Dr. Basilio Cairo care, now presenting to [facility] long emergency department after found to be more lethargic at home and difficult to by his wife. Patient explains he has felt progressively weaker over the past several days and unable to ambulate, went to bed last night and provide unable to wake up this morning. Wife also explains patient has  been more confused over the past several days. Patient also reports progressively worsening shortness of breath and feeling that he cannot take full deep breath, worse with exertion and somewhat improved with rest, 2-3 pillow orthopnea. Patient denies chest pain, no specific abdominal concerns. Patient explains he is still able to void but has sensation of incomplete voiding. Patient denies dysuria, urinary urgency or frequency, no flank pain, no fevers, and no chills. Patient denies specific focal neurological symptoms, no dizziness, no palpitations, no visual changes. Patient explains he was first diagnosed with kidney disease about 7 years ago but has never had any workup done. He has an appointment scheduled with Dr. Hyman Hopes this month for further discussion on peritoneal dialysis. Patient reports he is not interested in other forms of hemodialysis other than peritoneal one.   Assessment / Plan / Recommendation Clinical Impression  Pt. tolerated the PMSV beautifully, and was able to phonate immediately.  Voice quality is hoarse, but this is his baseline due to vocal cord SCCa.  (VC lesions were removed by Dr. Jearld Fenton in July).  Sats. increased from 98% and remained at 100% throughout this session.  Pt. responded to questions with short phrases, and reported he is releived to be able to communicate verbally.  Reviewed with RN and family the precautions for use with PMSV, and how to place, remove, and clean the speaking valve.  Educated pt. and family to  MBS vs. FEES.  They prefer FEES.    SLP Assessment  Patient needs continued Speech Lanaguage Pathology Services    Follow Up Recommendations  Home health SLP    Frequency and Duration min 2x/week  2 weeks   Pertinent Vitals/Pain n/a    SLP Goals Potential to Achieve Goals: Good Progress/Goals/Alternative treatment plan discussed with pt/caregiver and they: Agree SLP Goal #1: Pt. will wear PMSV for all waking hours with all VSS. SLP Goal #2: Family  will demonstrated placement, removal, and verbalize cleaning instructions independantly. SLP Goal #3: Pt./family will verbalize PMSV precautions.   PMSV Trial  PMSV was placed for: 40 minutes Able to redirect subglottic air through upper airway: Yes Able to Attain Phonation: Yes Voice Quality: Hoarse (Hoarse at baseline due to SCCa bileteral ) Able to Expectorate Secretions: Yes Level of Secretion Expectoration with PMSV: Oral Breath Support for Phonation: Adequate Intelligibility: Intelligible Respirations During Trial: 20 SpO2 During Trial: 100 % Pulse During Trial: 102   Tracheostomy Tube       Vent Dependency  FiO2 (%): 35 %    Cuff Deflation Trial Tolerated Cuff Deflation: Yes Length of Time for Cuff Deflation Trial: 5 minutes Behavior: Alert;Quiet;Responsive to questions;Cooperative   [name] T 02/15/2013, 11:03 AM

## 2013-02-15 NOTE — Progress Notes (Addendum)
Renal Service Daily Progress Note  Subjective: off vent, moved to floor, doing well, no complaints  Physical Exam:  Blood pressure 154/71, pulse 95, temperature 98.5 F (36.9 C), temperature source Oral, resp. rate 16, height 5\' 10"  (1.778 m), weight 86.4 kg (190 lb 7.6 oz), SpO2 96.00%. Gen: up in bed, alert, pleasant, trach in place Heart: RRR no M or rub Lungs: clear bilat Abd: nondistended, nontender, no ascites Ext: no LE edema Access:  R IJ TDC   Assessment: 1. CKD, new ESRD- new start, permcath by IR 8/22; pt planning to do PD, will plan PD cath placement after recovered from this illness 2. HTN/vol- +IS edema by xray today and on 8/25, removed 2kg w HD today, making urine, will give one dose IV lasix as well 3. Anemia- Hb 8.2, transfuse prn, s/p darbe 100ug 8/23, no Fe d/t ^ferritin 4. VDRF / hx laryngeal Ca- for trach 5. AMS- resolved 6. HCAP- on maxipime, suspected aspiration 7. Laryngeal Ca- on XRT prior to admission 8. Afib / RVR- on diltiazem drip, BB 9. Nutrition- alb 1.8  Plan- HD today, watch volume may need further UF  Victor Moselle  MD Pager [phone]    Cell  [phone] 02/15/2013, 4:24 PM    Recent Labs Lab 02/10/13 1326 02/10/13 1800  02/13/13 0420 02/14/13 0500 02/15/13 0500  NA 137  --   < > 138 138 138  K 6.9*  --   < > 3.7 3.6 3.7  CL 103  --   < > 101 100 99  CO2 17*  --   < > 21 29 25   GLUCOSE 217*  --   < > 159* 119* 129*  BUN 100*  --   < > 93* 51* 74*  CREATININE 10.44*  --   < > 8.86* 5.22* 6.54*  CALCIUM 8.1*  --   < > 8.2* 7.5* 7.8*  PHOS  --  8.4*  --   --   --   --   < > = values in this interval not displayed.  Recent Labs Lab 02/10/13 1000 02/11/13 0520 02/13/13 0420  AST 190* 194* 58*  ALT 155* 94* 10  ALKPHOS 84 54 60  BILITOT 0.3 0.2* 0.2*  PROT 6.9 5.4* 5.0*  ALBUMIN 2.7* 2.2* 1.8*    Recent Labs Lab 02/10/13 1000  02/13/13 0420 02/14/13 0500 02/15/13 0500  WBC 9.2  < > 7.6 6.4 9.3  NEUTROABS 8.0*  --   6.0  --   --   HGB 11.5*  < > 8.0* 7.7* 8.2*  HCT 35.7*  < > 24.9* 23.1* 25.5*  MCV 90.8  < > 90.5 91.3 91.4  PLT 211  < > 150 137* 143*  < > = values in this interval not displayed. Marland Kitchen antiseptic oral rinse  15 mL Mouth Rinse QID  . ceFEPime (MAXIPIME) IV  2 g Intravenous Q M,W,F-HD  . chlorhexidine  15 mL Mouth Rinse BID  . darbepoetin (ARANESP) injection - DIALYSIS  100 mcg Intravenous Q Sat-HD  . etomidate  40 mg Intravenous Once  . heparin  5,000 Units Subcutaneous Q8H  . insulin aspart  0-9 Units Subcutaneous Q4H  . metoprolol  2.5 mg Intravenous Q6H  . pantoprazole (PROTONIX) IV  40 mg Intravenous Q24H  . sodium chloride  3 mL Intravenous Q12H   . diltiazem (CARDIZEM) infusion 5 mg/hr (02/15/13 0800)   sodium chloride, sodium chloride, alteplase, fentaNYL, heparin, heparin, heparin, labetalol, levalbuterol, lidocaine (PF), lidocaine-prilocaine, ondansetron (  ZOFRAN) IV, pentafluoroprop-tetrafluoroeth

## 2013-02-15 NOTE — Progress Notes (Signed)
Trach care provided with inner cannula changed per protocol. Patient tolerated well. Patient changed to 28% trach collar per RT. Call bell near. Will continue to monitor.Victor Lane

## 2013-02-15 NOTE — Progress Notes (Signed)
Will continue diltalizem gtt per MD d/t not able to convert to po; will reassess in am. Mamie Levers

## 2013-02-15 NOTE — Procedures (Addendum)
Objective Swallowing Evaluation: Fiberoptic Endoscopic Evaluation of Swallowing  Patient Details  Name: Victor Lane MRN: [id number] Date of Birth: 08/04/1945  Today's Date: 02/15/2013 Time: 4098-1191 SLP Time Calculation (min): 74 min  Past Medical History:  Past Medical History  Diagnosis Date  . Diabetes mellitus   . Hypertension   . GERD (gastroesophageal reflux disease)   . Fibromyalgia   . Chest pain   . Carotid artery occlusion   . Chronic kidney disease     - Stage 5- follwed by Dr Hyman Hopes  . Complication of anesthesia     agiation when he awaken  . Neuropathy   . Arthritis   . Sleep apnea   . H/O vitamin D deficiency   . Hearing loss   . Hypercholesteremia   . Myalgia and myositis   . 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 Cholecystectomy  1993  . Rotator cuff repair  07/06/2011    right  . Carotid endarterectomy Left   . Coolonoscopy    . Colonoscopy w/ biopsies and polypectomy      Hx: of  . 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 Jearld Fenton, final pathology report 01/04/2013) currently undergoing radiation therapy under Dr. Basilio Cairo care, now presenting to [facility] long emergency department after found to be more lethargic at home and difficult to by his wife. Patient explains he has felt progressively weaker over the past several days and unable to ambulate, went to bed last night  and provide unable to wake up this morning. Wife also explains patient has been more confused over the past several days. Patient also reports progressively worsening shortness of breath and feeling that he cannot take full deep breath, worse with exertion and somewhat improved with rest, 2-3 pillow orthopnea. Patient denies chest pain, no specific abdominal concerns. Patient explains he is still able to void but has sensation of incomplete voiding. Patient denies dysuria, urinary urgency or frequency, no flank pain, no fevers, and no chills. Patient denies specific focal neurological symptoms, no dizziness, no palpitations, no visual changes. Patient explains he was first diagnosed with kidney disease about 7 years ago but has never had any workup done. He has an appointment scheduled with Dr. Hyman Hopes this month for further discussion on peritoneal dialysis. Patient reports he is not interested in other forms of hemodialysis other than peritoneal one.     Assessment / Plan / Recommendation Clinical Impression  Dysphagia Diagnosis: Severe pharyngeal phase dysphagia Clinical impression: Pt, with secretions on the posterior pharyngeal wall and on the UES, that moved when pt. coughed, but did not completely clear.  Suction (oral or tracheal) would not clear secretions.  Pt. exhibits a moderate to severe pharyngeal dysphagia, with decreased base ot tongue to pharyngeal wall contraction, decreased bolus propulsion through the pharyngeal areal, reduced vocal cord adduction (structurally unable to completely adduct the cords), resulting in positive aspiration of honey thick liquids, and penetration and  moderate laryngeal residue with purees.  Pt. was unable to clear the puree with repeat dry swallows, and a chin tuck did not prevent aspiration or decrease laryngeal residue.  The residue was observed to penetrate the glottis via the interarytenoid space, as it mixed with secretions.    Treatment Recommendation   Therapy as outlined in treatment plan below    Diet Recommendation NPO;Alternative means - temporary;Ice chips PRN after oral care   Medication Administration: Via alternative means    Other  Recommendations Recommended Consults: FEES (Repeat FEES prior to resuming PO's) Oral Care Recommendations: Staff/trained caregiver to provide oral care;Oral care QID (prior to allowing 1-2 ice chips) Other Recommendations: Place PMSV during PO intake   Follow Up Recommendations  Home health SLP    Frequency and Duration        Pertinent Vitals/Pain n/a    SLP Swallow Goals    1.  Pt. Will complete repeat objective swallow evaluation (preferably FEES) prior to resuming po diet.  2.  Pt. Will tolerate occassional ice chips after thorough oral care, with mod. Assist.    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 Jearld Fenton, final pathology report 01/04/2013) currently undergoing radiation therapy under Dr. Basilio Cairo care, now presenting to [facility] long emergency department after found to be more lethargic at home and difficult to by his wife. Patient explains he has felt progressively weaker over the past several days and unable to ambulate, went to bed last night and provide unable to wake up this morning. Wife also explains patient has been more confused over the past several days. Patient also reports progressively worsening shortness of breath and feeling that he cannot take full deep breath, worse with exertion and somewhat improved with rest, 2-3 pillow orthopnea. Patient denies chest pain, no specific abdominal concerns. Patient explains he is still able to void but has sensation of incomplete voiding. Patient denies  dysuria, urinary urgency or frequency, no flank pain, no fevers, and no chills. Patient denies specific focal neurological symptoms, no dizziness, no palpitations, no visual changes. Patient explains he was first diagnosed with kidney disease about 7 years ago but has never had any workup done. He has an appointment scheduled with Dr. Hyman Hopes this month for further discussion on peritoneal dialysis. Patient reports he is not interested in other forms of hemodialysis other than peritoneal one. Type of Study: Fiberoptic Endoscopic Evaluation of Swallowing Reason for Referral: Objectively evaluate swallowing function Previous Swallow Assessment: None in [facility] system.  Admitted with pneumonia; aspiration suspected. Diet Prior to this Study: NPO Temperature Spikes Noted: Yes Respiratory Status: Trach Trach Size and Type: Cuff;With PMSV in place (7.5) History of Recent Intubation: No Behavior/Cognition: Alert;Cooperative;Requires cueing;Decreased sustained attention Oral Cavity - Dentition: Adequate natural dentition Oral Motor / Sensory Function: Within functional limits Self-Feeding Abilities: Needs assist Patient Positioning: Upright in bed Baseline Vocal Quality: Hoarse (Baseline after removal of VC lesions (SCCa)) Volitional Cough: Strong Volitional Swallow: Able to elicit Anatomy: Other (Comment) (Excised sections of VC bilaterally) Pharyngeal Secretions: Standing secretions in (comment) (on the posterior pharyngeal wall, on the UES)    Reason for Referral Objectively evaluate swallowing function   Oral Phase Oral Preparation/Oral Phase Oral Phase: WFL   Pharyngeal Phase Pharyngeal Phase Pharyngeal Phase:  Impaired Pharyngeal - Honey Pharyngeal - Honey Cup: Reduced laryngeal elevation;Reduced airway/laryngeal closure;Reduced tongue base retraction;Penetration/Aspiration after swallow;Moderate aspiration;Pharyngeal residue - pyriform sinuses;Pharyngeal residue - valleculae;Pharyngeal residue -  cp segment;Inter-arytenoid space residue Penetration/Aspiration details (honey cup): Material enters airway, passes BELOW cords and not ejected out despite cough attempt by patient Pharyngeal - Solids Pharyngeal - Puree: Reduced laryngeal elevation;Reduced airway/laryngeal closure;Reduced tongue base retraction;Penetration/Aspiration during swallow;Penetration/Aspiration after swallow;Pharyngeal residue - valleculae;Pharyngeal residue - pyriform sinuses;Pharyngeal residue - cp segment Penetration/Aspiration details (puree): Material enters airway, remains ABOVE vocal cords and not ejected out  Cervical Esophageal Phase    GO              [name] T 02/15/2013, 3:46 PM

## 2013-02-15 NOTE — Progress Notes (Signed)
NUTRITION FOLLOW UP  Intervention:   1. Supplement diet as appropriate.   Nutrition Dx:   Inadequate oral intake related to inability to eat as evidenced by NPO status; ongoing.   Goal:   Pt to meet >/= 90% of their estimated nutrition needs; not met.   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 Per nephrology pt will be HD dependent from now on. Hyperkalemia improved with HD. Trach placed 8/26. Panda removed this am. Pt scheduled for FEES this afternoon. Discussed importance of nutrition with pt. Pt and wife would like Renal diet education prior to d/c.   Height: Ht Readings from Last 1 Encounters:  02/12/13 5\' 10"  (1.778 m)    Weight Status:   Wt Readings from Last 1 Encounters:  02/15/13 190 lb 7.6 oz (86.4 kg)  Admission weight 194 lb 8/22  Re-estimated needs:  Kcal: 2100-2300 Protein: 105-115 grams Fluid: >1.2 L/day  Skin: no issues noted  Diet Order: NPO   Intake/Output Summary (Last 24 hours) at 02/15/13 0941 Last data filed at 02/15/13 0900  Gross per 24 hour  Intake 692.96 ml  Output   1425 ml  Net -732.04 ml    Last BM: via rectal pouch (placed 8/23) 300 ml documented 8/25 300 ml 8/26  Labs:   Recent Labs Lab 02/10/13 1326 02/10/13 1800  02/11/13 0520 02/12/13 0500 02/13/13 0420 02/14/13 0500 02/15/13 0500  NA 137  --   < > 142 141 138 138 138  K 6.9*  --   < > 5.3* 4.7 3.7 3.6 3.7  CL 103  --   < > 106 102 101 100 99  CO2 17*  --   < > 20 23 21 29 25   BUN 100*  --   < > 88* 68* 93* 51* 74*  CREATININE 10.44*  --   < > 9.27* 7.26* 8.86* 5.22* 6.54*  CALCIUM 8.1*  --   < > 8.3* 8.5 8.2* 7.5* 7.8*  MG  --   --   --   --  2.1  --   --   --   PHOS  --  8.4*  --   --    --   --   --   --   GLUCOSE 217*  --   < > 126* 149* 159* 119* 129*  < > = values in this interval not displayed.  CBG (last 3)   Recent Labs  02/14/13 2037 02/14/13 2340 02/15/13 0354  GLUCAP 150* 151* 113*    Scheduled Meds: . antiseptic oral rinse  15 mL Mouth Rinse QID  . ceFEPime (MAXIPIME) IV  2 g Intravenous Q M,W,F-HD  . chlorhexidine  15 mL Mouth Rinse BID  . darbepoetin (ARANESP) injection - DIALYSIS  100 mcg Intravenous Q Sat-HD  . etomidate  40 mg Intravenous Once  . heparin  5,000 Units Subcutaneous Q8H  . insulin aspart  0-9 Units Subcutaneous Q4H  . metoprolol  2.5 mg Intravenous Q6H  . pantoprazole (PROTONIX) IV  40 mg Intravenous Q24H  . sodium chloride  3 mL Intravenous Q12H    Continuous Infusions: . diltiazem (CARDIZEM) infusion 5 mg/hr (02/15/13 0800)    Kendell Bane RD, LDN,  CNSC [phone] Pager [phone] After Hours Pager

## 2013-02-15 NOTE — Progress Notes (Signed)
ANTIBIOTIC CONSULT NOTE - FOLLOW UP  Pharmacy Consult for Vancomycin + Cefepime Indication: Rule out pneumonia   Allergies  Allergen Reactions  . Amoxicillin     Causes upset stomach    Patient Measurements: Height: 5\' 10"  (177.8 cm) Weight: 190 lb 7.6 oz (86.4 kg) IBW/kg (Calculated) : 73  Vital Signs: Temp: 97.9 F (36.6 C) (08/27 0819) Temp src: Oral (08/27 0819) BP: 175/64 mmHg (08/27 0814) Pulse Rate: 91 (08/27 0814) Intake/Output from previous day: 08/26 0701 - 08/27 0700 In: 720.7 [I.V.:261.7; NG/GT:458.9] Out: 1310 [Urine:1010; Stool:300] Intake/Output from this shift: Total I/O In: 10 [I.V.:10] Out: 125 [Urine:125]  Labs:  Recent Labs  02/13/13 0420 02/14/13 0500 02/15/13 0500  WBC 7.6 6.4 9.3  HGB 8.0* 7.7* 8.2*  PLT 150 137* 143*  CREATININE 8.86* 5.22* 6.54*   Estimated Creatinine Clearance: 11.3 ml/min (by C-G formula based on Cr of 6.54).  Recent Labs  02/15/13 0500  VANCORANDOM 20.6     Microbiology: Recent Results (from the past 720 hour(s))  MRSA PCR SCREENING     Status: None   Collection Time    02/10/13  6:44 PM      Result Value Range Status   MRSA by PCR NEGATIVE  NEGATIVE Final   Comment:            The GeneXpert MRSA Assay (FDA     approved for NASAL specimens     only), is one component of a     comprehensive MRSA colonization     surveillance program. It is not     intended to diagnose MRSA     infection nor to guide or     monitor treatment for     MRSA infections.  CULTURE, BLOOD (ROUTINE X 2)     Status: None   Collection Time    02/10/13  7:28 PM      Result Value Range Status   Specimen Description BLOOD LEFT ARM   Final   Special Requests BOTTLES DRAWN AEROBIC AND ANAEROBIC 10CC   Final   Culture  Setup Time     Final   Value: 02/11/2013 01:15     Performed at [facility]   Culture     Final   Value:        BLOOD CULTURE RECEIVED NO GROWTH TO DATE CULTURE WILL BE HELD FOR 5 DAYS BEFORE ISSUING A  FINAL NEGATIVE REPORT     Performed at [facility]   Report Status PENDING   Incomplete  CULTURE, BLOOD (ROUTINE X 2)     Status: None   Collection Time    02/10/13  7:28 PM      Result Value Range Status   Specimen Description BLOOD LEFT ARM   Final   Special Requests BOTTLES DRAWN AEROBIC AND ANAEROBIC 10CC   Final   Culture  Setup Time     Final   Value: 02/11/2013 01:15     Performed at [facility]   Culture     Final   Value:        BLOOD CULTURE RECEIVED NO GROWTH TO DATE CULTURE WILL BE HELD FOR 5 DAYS BEFORE ISSUING A FINAL NEGATIVE REPORT     Performed at [facility]   Report Status PENDING   Incomplete  CULTURE, RESPIRATORY (NON-EXPECTORATED)     Status: None   Collection Time    02/12/13 11:33 AM      Result Value Range Status  Specimen Description TRACHEAL ASPIRATE   Final   Special Requests Normal   Final   Gram Stain     Final   Value: ABUNDANT WBC PRESENT, PREDOMINANTLY PMN     RARE SQUAMOUS EPITHELIAL CELLS PRESENT     NO ORGANISMS SEEN     Performed at [facility]   Culture     Final   Value: Non-Pathogenic Oropharyngeal-type Flora Isolated.     Performed at [facility]   Report Status PENDING   Incomplete    Anti-infectives   Start     Dose/Rate Route Frequency Ordered Stop   02/13/13 1745  vancomycin (VANCOCIN) IVPB 1000 mg/200 mL premix     1,000 mg 200 mL/hr over 60 Minutes Intravenous  Once 02/13/13 1740     02/11/13 2200  aztreonam (AZACTAM) 1 g in dextrose 5 % 50 mL IVPB  Status:  Discontinued     1 g 100 mL/hr over 30 Minutes Intravenous Every 24 hours 02/11/13 1201 02/11/13 1205   02/11/13 2200  ceFEPIme (MAXIPIME) 1 g in dextrose 5 % 50 mL IVPB     1 g 100 mL/hr over 30 Minutes Intravenous Every 24 hours 02/11/13 1205 02/18/13 2159   02/11/13 1600  vancomycin (VANCOCIN) 500 mg in sodium chloride 0.9 % 100 mL IVPB     500 mg 100 mL/hr over 60 Minutes Intravenous  Once 02/11/13 1431 02/11/13 1802    02/10/13 2200  aztreonam (AZACTAM) 2 g in dextrose 5 % 50 mL IVPB  Status:  Discontinued     2 g 100 mL/hr over 30 Minutes Intravenous 3 times per day 02/10/13 1853 02/10/13 1928   02/10/13 2000  aztreonam (AZACTAM) 2 g in dextrose 5 % 50 mL IVPB  Status:  Discontinued     2 g 100 mL/hr over 30 Minutes Intravenous Every 24 hours 02/10/13 1928 02/11/13 1201   02/10/13 1945  vancomycin (VANCOCIN) 1,500 mg in sodium chloride 0.9 % 500 mL IVPB     1,500 mg 250 mL/hr over 120 Minutes Intravenous  Once 02/10/13 1937 02/10/13 2206   02/10/13 1900  ceFAZolin (ANCEF) IVPB 2 g/50 mL premix     2 g 100 mL/hr over 30 Minutes Intravenous On call 02/10/13 1853 02/10/13 2012   02/10/13 1500  vancomycin (VANCOCIN) IVPB 1000 mg/200 mL premix  Status:  Discontinued     1,000 mg 200 mL/hr over 60 Minutes Intravenous On call 02/10/13 1458 02/10/13 1501   02/10/13 1100  cefTRIAXone (ROCEPHIN) 1 g in dextrose 5 % 50 mL IVPB     1 g 100 mL/hr over 30 Minutes Intravenous  Once 02/10/13 1057 02/10/13 1159   02/10/13 1100  azithromycin (ZITHROMAX) 500 mg in dextrose 5 % 250 mL IVPB     500 mg 250 mL/hr over 60 Minutes Intravenous  Once 02/10/13 1057 02/10/13 1348      Assessment: [name] who continues on vancomycin and cefepime for r/o PNA, concerned for aspiration. A random vancomycin level on 02/15/13 was therapeutic (VR 20.6, goal 15-25 mcg/mL) after he received 1gm IV post-HD on 8/25. The patient is noted to have some residual UOP-- total of 0.5 mL/kg/hr over the past 24 hours. He is tolerating his HD session today, 02/15/13, at a BFR of 300, anticipated for a total of 3.5 hours. Now that pt has been tolerating full-length HD, cefepime can be changed to HD dosing.    Goal of Therapy:  Pre-HD Vancomycin trough of 15-25 mcg/ml  Proper antibiotics for infection/cultures adjusted for renal/hepatic function    Plan:  -Vancomycin 1gm IV x 1 (no standing vanc for now) -Change Cefepime to 2gm IV qHD -Follow HD  schedule/duration/tolerance -Monitor micro data, vancomycin levels  Victor Lane, PharmD Candidate 02/15/2013,8:29 AM  Addendum:  MD has discontinued vancomycin and continuing therapy for PNA with cefepime alone. Plan for 7 days of total antibiotics which will be completed tomorrow.   Agree with above plan for Cefepime 2g IV post-HD today.  Follow-up culture data and clincial status.  Follow-up that stop date is added 8/28.   Victor Lane, PharmD, BCPS Clinical Pharmacist [phone] 02/15/2013, 10:03 AM

## 2013-02-15 NOTE — Op Note (Signed)
NAME:  Victor Lane, Victor Lane NO.:  [id number]  MEDICAL RECORD NO.:  [id number]  LOCATION:  2M09C                        FACILITY:  [facility]  PHYSICIAN:  Nelda Bucks, MD DATE OF BIRTH:  Mar 15, 1946  DATE OF PROCEDURE: DATE OF DISCHARGE:                              OPERATIVE REPORT   PROCEDURE:  Percutaneous tracheostomy.  Consent was obtained from the patient's wife, fully aware of risks and benefits of procedure including infection, bleeding, pneumothorax, and death.  PREOPERATIVE DIAGNOSES:  Laryngeal cancer, upper airway obstruction, respiratory failure.  POSTOPERATIVE DIAGNOSES:  Status post tracheostomy secondary to upper airway obstruction.  BRONCHOSCOPIST FOR THE PROCEDURE:  Coralyn Helling, MD  DESCRIPTION OF PROCEDURE:  The patient was placed in supine position. Chlorhexidine preparation was used to sterilize the operative site.  7 mL of lidocaine with epinephrine was injected over the 2nd and 3rd endotracheal space.  Bronchoscopist placed a bronchoscope through the endotracheal tube and backed up to approximately 19 cm.  A 1.2 vertical incision was made.  Dissection was made down to the tracheal planes, identification of strap muscles were made, there was no arterial or vascular structures.  An 18-gauge needle was placed over a white catheter sheath into the airway without any post trauma injury directly visualized by the bronchoscopist.  The white catheter sheath remained, wire was placed through the white catheter sheath, which was directly visualized again without any injury.  White catheter sheath was removed. A 14-French punch dilator was placed over the wire successfully in and out of the airway.  A progressive rhino dilator to approximately 32 to 35-French over a glider was placed over the wire successfully in and out of the airway.  The glider and wire remained and the dilator was removed.  A size 8 tracheostomy over 28-French dilator was placed  over the glider and wire successfully into the airway.  Everything was removed except for the tracheostomy.  Tracheostomy was then sutured in with 4-0 monofilament sutures.  Blood loss for the procedure was less than 1 mL.  The patient did require paralysis for this procedure as well as continued propofol infusion and bolus Versed and fentanyl.  The patient's hemodynamic saturations were all within normal limits throughout the procedure.  The bronchoscopist placed a bronchoscope through the tracheostomy, noted carina approximately 4 cm below without any posterior wall injury, bleeding, or lacerations.  Postoperative portable chest x-ray is pending at this time.  This patient can follow up in our Percutaneous Tracheostomy Clinic at [facility] by calling [phone] or of course to be followed up with his primary ENT specialist, Dr. Jearld Fenton.     Nelda Bucks, MD     DJF/MEDQ  D:  02/14/2013  T:  02/15/2013  Job:  [id number]  cc:   Suzanna Obey, M.D. Charlcie Cradle Delford Field, MD, FCCP Coralyn Helling, MD

## 2013-02-15 NOTE — Progress Notes (Signed)
Patient previously failed swallow evaluation. Patient's family to room and upset. Requesting that patient have some type of nutrition tonight. Spoke to Dr. Vassie Loll. Instructed to reinsert NGT and resume feedings. NGT difficult to insert x3RNs; patient did not tolerate well. NGT not placed per patient's wishes. Explained to patient's wife and son. Agreed to re-evaluate with MD in AM. No further distress. Will continue to monitor. Call bell and family near. Mamie Levers

## 2013-02-15 NOTE — Progress Notes (Addendum)
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 Jearld Fenton) ,undergoing RT [name]) adm to [facility] 8/22 with confusion, dyspnea, hypoxia, hyperkalemia & AKI with cr 11/ BUN 101 - transferred to [facility] for rt IJ permacath placement then , emergent HD. PCCM consulted 8/22 pm for worsening dyspnea while on HD requiring bipap. CXR shows BL nodular infiltrates ? pna vs fluid  SIGNIFICANT EVENTS: 01/04/13 microlaryngoscopy - Jearld Fenton 8/22 Start HD, PCCM consulted for dyspnea, SVT 8/24 Failed BiPAP >> VDRF, ENT consulted 8/26 Perc trach by DF >> #8 trach 8/27 Off vent, transfer to telemetry  STUDIES:    LINES / TUBES: Rt IJ permacath (IR) 8/22 >> Lt IJ CVL 8/24 >> ETT 8/24 >> 8/26 Trach (DF) 8/26 >>  CULTURES: BCx2 8/22 >> Trach asp 8/24>>oral flora  ANTIBIOTICS: Zithromax 8/22 >> 8/22 Aztreonam 8/22 >> 8/22 Ancef 8/22 >> 8/22 Rocephin 8/22 >> 8/22 Vancomycin 8/22 >> 8/27 Cefepime 8/23 >>   SUBJECTIVE:  Off vent.  Pulled out NG tube.  Denies chest pain/dyspnea.  C/o cough with sputum.  Getting HD.  VITAL SIGNS: Temp:  [97.5 F (36.4 C)-99.3 F (37.4 C)] 97.9 F (36.6 C) (08/27 0819) Pulse Rate:  [78-104] 99 (08/27 0902) Resp:  [5-31] 20 (08/27 0902) BP: (90-192)/(50-118) 157/79 mmHg (08/27 0902) SpO2:  [91 %-100 %] 100 % (08/27 0902) FiO2 (%):  [35 %-100 %] 35 % (08/27 0830) Weight:  [190 lb 7.6 oz (86.4 kg)] 190 lb 7.6 oz (86.4 kg) (08/27 0441) 35% TC  INTAKE / OUTPUT: Intake/Output     08/26 0701 - 08/27 0700 08/27 0701 - 08/28 0700   I.V. (mL/kg) 261.7 (3) 10 (0.1)   Other     NG/GT 458.9    Total Intake(mL/kg) 720.7 (8.3) 10 (0.1)   Urine  (mL/kg/hr) 1010 (0.5) 125 (0.7)   Stool 300 (0.1)    Total Output 1310 125   Net -589.4 -115        Stool Occurrence 2 x      PHYSICAL EXAMINATION: General: No distress Neuro: Alert, normal strength, follows commands HEENT: trach site clean Cardiovascular: regular, no murmur Lungs: scattered rhonchi Abdomen:  Soft, non tender Musculoskeletal:  No edema Skin:  No rash  LABS:  CBC Recent Labs     02/13/13  0420  02/14/13  0500  02/15/13  0500  WBC  7.6  6.4  9.3  HGB  8.0*  7.7*  8.2*  HCT  24.9*  23.1*  25.5*  PLT  150  137*  143*   Coag's Recent Labs     02/13/13  1100  APTT  30  INR  1.10    BMET Recent Labs     02/13/13  0420  02/14/13  0500  02/15/13  0500  NA  138  138  138  K  3.7  3.6  3.7  CL  101  100  99  CO2  21  29  25   BUN  93*  51*  74*  CREATININE  8.86*  5.22*  6.54*  GLUCOSE  159*  119*  129*   Electrolytes Recent Labs     02/13/13  0420  02/14/13  0500  02/15/13  0500  CALCIUM  8.2*  7.5*  7.8*   ABG Recent Labs     02/12/13  1114  PHART  7.428  PCO2ART  36.3  PO2ART  218.0*   Liver Enzymes Recent Labs     02/13/13  0420  AST  58*  ALT  10  ALKPHOS  60  BILITOT  0.2*  ALBUMIN  1.8*   Glucose Recent Labs     02/14/13  0755  02/14/13  1150  02/14/13  1603  02/14/13  2037  02/14/13  2340  02/15/13  0354  GLUCAP  119*  136*  134*  150*  151*  113*    Imaging Dg Chest Port 1 View  02/15/2013   *RADIOLOGY REPORT*  Clinical Data: Follow up pneumonia  PORTABLE CHEST - 1 VIEW  Comparison: Prior chest x-ray 02/14/2013  Findings: The nasogastric tube is been removed.  Tracheostomy tube remains in good position with the tip midline and at the level of the clavicles.  Right IJ tunneled hemodialysis catheter is in unchanged position.  The catheter tips project over the mid and distal SVC.  A left IJ approach central venous catheter terminates with its tip in the horizontal segment of the brachiocephalic vein. Stable  cardiomegaly.  Aortic atherosclerosis noted.  Similar degree of pulmonary vascular congestion and mild interstitial edema. Right slightly greater than left bibasilar opacities are nonspecific.  No pneumothorax.  IMPRESSION:  1.  Stable to slightly increased pulmonary vascular congestion and interstitial edema. 2.  Right greater than left bibasilar opacities may reflect atelectasis and / or asymmetric edema versus right basilar pneumonia. 3.  The nasogastric tube has been removed. 4.  The tip of the left IJ central venous catheter remains within the left innominate vein.   Original Report Authenticated By: Malachy Moan, M.D.   Chest Portable 1 View To Assess Tube Placement And Rule-out Pneumothorax  02/14/2013   CLINICAL DATA:  Postop from tracheostomy tube placement. Acute respiratory failure.  EXAM: PORTABLE CHEST - 1 VIEW  COMPARISON:  02/13/2013  FINDINGS: A new tracheostomy tube is seen in appropriate position. Nasogastric tube and right internal jugular dual-lumen central venous catheters remain in appropriate position.  Mild bibasilar infiltrates or atelectasis show no significant change.  IMPRESSION: New tracheostomy tube in appropriate position. Bibasilar infiltrates or atelectasis, without significant change.   Electronically Signed   By: Myles Rosenthal   On: 02/14/2013 11:04   Dg Abd Portable 1v  02/14/2013   CLINICAL DATA:  NG tube placement.  EXAM: PORTABLE ABDOMEN - 1 VIEW  COMPARISON:  None.  FINDINGS: Feeding tube is in place with the tip in the mid portion of the stomach. Nonobstructive bowel gas pattern. Prior cholecystectomy.  IMPRESSION: Feeding tube tip in the body of the stomach.   Electronically Signed   By: Charlett Nose   On: 02/14/2013 12:54     ASSESSMENT / PLAN:  PULMONARY A: Acute respiratory failure 2nd to aspiration pneumonia in setting of laryngeal cancer >> needs airway; s/p trach 8/26. P:   -continue trach collar -d/c vent -f/u CXR intermittently -change to prn BD's  >> use xopenex in setting of tachyarrhythmia  CARDIOVASCULAR A: A fib with RVR >> improved, and now in sinus rhythm. Hx of HTN. P:  -wean off cardizem to keep HR < 110 -continue lopressor >> use IV until able to take pills  RENAL A:  ESRD on HD. Hyperkalemia >> resolved. P:   -HD per renal -d/c  foley and place condom cath  GASTROINTESTINAL A: Dysphagia. Nutrition. Hx of GERD. Diarrhea >> improving. P:   -speech therapy to assess swallowing >> pending results will then decide if need for G tube -continue protonix -keep rectal tube in for now  HEMATOLOGIC A: Anemia of critical illness and chronic disease. Hx of laryngeal cancer s/p XRT. P:  -f/u CBC -transfuse for Hb < 7 -aranesp per renal -SQ heparin for DVT prevention  INFECTIOUS A:  HCAP, aspiration P:   -D6/7 cefepime -d/c vancomycin 8/27 -keep CVL in place for now  ENDOCRINE A:  DM-2   P:   -SSI  NEUROLOGIC A:  Encephalopathy d/t uremia and hypoxemia >> resolved. Pain control. Deconditioning. P:   -prn fentanyl -PT/OT evaluation  TODAY'S SUMMARY: Updated wife at bedside.  Transfer to telemetry.  Keep on PCCM service per family request.  Coralyn Helling, MD [facility] Pulmonary/Critical Care 02/15/2013, 9:04 AM Pager:  [phone] After 3pm call: [phone]   Pt c/o numbness in left hand involving lateral half of 4 th digit and 5 th digit.  He reports resting his elbow and hand on desk frequently.  Has reproducible symptoms with pressure over hook of hamate bone on Lt.  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  Monitor for now.  Also did very well with speech valve.  For FEES this PM.  Coralyn Helling, MD [facility] Pulmonary/Critical Care 02/15/2013, 10:56 AM Pager:  [phone] After 3pm call: [phone]

## 2013-02-15 NOTE — Progress Notes (Signed)
eLink Physician-Brief Progress Note Patient Name: Victor Lane DOB: 12/19/1945 MRN: [id number]  Date of Service  02/15/2013   HPI/Events of Note  Failed swallow   eICU Interventions  Place NGT & resume TFs ct diltiazem gtt for now Family requesting second nephrology opinion - will have rounding team address ina m   Intervention Category Major Interventions: Other:  Alyscia Carmon V. 02/15/2013, 5:55 PM

## 2013-02-15 NOTE — Progress Notes (Signed)
02/15/13 0140 am Pt removed panda tube from left nare and does not wish for reinsertion. Pt writes that it is "uncomfortable". MD notified. Will leave out for now as pt is potential candidate for G tube. Will continue to monitor.

## 2013-02-16 ENCOUNTER — Inpatient Hospital Stay (HOSPITAL_COMMUNITY): Payer: Medicare Other

## 2013-02-16 ENCOUNTER — Ambulatory Visit: Payer: Medicare Other

## 2013-02-16 ENCOUNTER — Encounter (HOSPITAL_COMMUNITY): Payer: Self-pay | Admitting: Radiology

## 2013-02-16 DIAGNOSIS — R1310 Dysphagia, unspecified: Secondary | ICD-10-CM

## 2013-02-16 DIAGNOSIS — J690 Pneumonitis due to inhalation of food and vomit: Secondary | ICD-10-CM

## 2013-02-16 LAB — GLUCOSE, CAPILLARY
Glucose-Capillary: 102 mg/dL — ABNORMAL HIGH (ref 70–99)
Glucose-Capillary: 109 mg/dL — ABNORMAL HIGH (ref 70–99)
Glucose-Capillary: 114 mg/dL — ABNORMAL HIGH (ref 70–99)

## 2013-02-16 LAB — CBC
HCT: 26.2 % — ABNORMAL LOW (ref 39.0–52.0)
MCV: 91.9 fL (ref 78.0–100.0)
Platelets: 166 10*3/uL (ref 150–400)
RBC: 2.85 MIL/uL — ABNORMAL LOW (ref 4.22–5.81)
WBC: 9.4 10*3/uL (ref 4.0–10.5)

## 2013-02-16 LAB — RENAL FUNCTION PANEL
Albumin: 1.9 g/dL — ABNORMAL LOW (ref 3.5–5.2)
BUN: 49 mg/dL — ABNORMAL HIGH (ref 6–23)
Chloride: 104 mEq/L (ref 96–112)
Glucose, Bld: 125 mg/dL — ABNORMAL HIGH (ref 70–99)
Potassium: 4 mEq/L (ref 3.5–5.1)

## 2013-02-16 MED ORDER — FENTANYL CITRATE 0.05 MG/ML IJ SOLN: 12.5000 ug | INTRAMUSCULAR | Status: DC | PRN

## 2013-02-16 MED ORDER — METOPROLOL TARTRATE 1 MG/ML IV SOLN
5.0000 mg | Freq: Four times a day (QID) | INTRAVENOUS | Status: DC
  Administered 2013-02-16 – 2013-02-21 (×19): 5 mg via INTRAVENOUS
  Filled 2013-02-16 (×21): qty 5

## 2013-02-16 MED ORDER — CEFAZOLIN SODIUM-DEXTROSE 2-3 GM-% IV SOLR
2.0000 g | Freq: Once | INTRAVENOUS | Status: AC
  Administered 2013-02-17: 2 g via INTRAVENOUS
  Filled 2013-02-16: qty 50

## 2013-02-16 NOTE — Progress Notes (Signed)
Assisted patient to bed. Oral care and trach care provide. Wife at bedside to observe. Central line d/c'd per protocol. 4x4 vaseline gauze applied; patient tolerated well. HOB 30 degrees. Call bell near. Will monitor.Mamie Levers

## 2013-02-16 NOTE — Evaluation (Signed)
Physical Therapy Evaluation Patient Details Name: Victor Lane MRN: [id number] DOB: [birth year] Today's Date: 02/16/2013 Time: 4098-1191 PT Time Calculation (min): 33 min  PT Assessment / Plan / Recommendation History of Present Illness  Pt admitted with AMS and hypoxia.  Was undergoing radiation for laryngeal carcinoma.  Now with trach, dysphagia (plan for PEG tube), new ESRD, afib with RVR.    Clinical Impression  Pt admitted with AMS and acute respiratory failure now with trach. Pt currently with functional limitations due to the deficits listed below (see PT Problem List). Pt highly motivated and willing to work with therapy  Pt will benefit from skilled PT to increase their independence and safety with mobility to allow discharge to the venue listed below.      PT Assessment  Patient needs continued PT services    Follow Up Recommendations  Home health PT;Supervision/Assistance - 24 hour    Equipment Recommendations  Rolling walker with 5" wheels    Frequency Min 3X/week    Precautions / Restrictions Precautions Precautions: Fall Precaution Comments: multiple lines Restrictions Weight Bearing Restrictions: No   Pertinent Vitals/Pain FiO2 28%; VSS       Mobility  Bed Mobility Bed Mobility: Supine to Sit;Sitting - Scoot to Edge of Bed Supine to Sit: 5: Supervision Sitting - Scoot to [place] of Bed: 5: Supervision Transfers Transfers: Sit to Stand;Stand to Sit Sit to Stand: 4: Min assist;With upper extremity assist;From bed Stand to Sit: 4: Min assist;With upper extremity assist;To chair/3-in-1 Details for Transfer Assistance: (A) to initiate transfer and slowly descend to recliner Ambulation/Gait Ambulation/Gait Assistance: 4: Min assist Ambulation Distance (Feet): 4 Feet Assistive device: 1 person hand held assist Ambulation/Gait Assistance Details: (A) to maintain balance.  Limited due to trach and overall fatigue.  Gait Pattern: Step-to pattern;Decreased stride  length;Narrow base of support Gait velocity: decreased    Exercises     PT Diagnosis: Difficulty walking;Generalized weakness  PT Problem List: Decreased strength;Decreased activity tolerance;Decreased balance;Decreased mobility;Decreased knowledge of use of DME;Cardiopulmonary status limiting activity PT Treatment Interventions: DME instruction;Gait training;Stair training;Functional mobility training;Therapeutic activities;Therapeutic exercise;Balance training;Patient/family education     PT Goals(Current goals can be found in the care plan section) Acute Rehab PT Goals Patient Stated Goal: return home PT Goal Formulation: With patient Time For Goal Achievement: 02/23/13 Potential to Achieve Goals: Good  Visit Information  Last PT Received On: 02/16/13 Assistance Needed: +2 (to ambulate, for lines) History of Present Illness: Pt admitted with AMS and hypoxia.  Was undergoing radiation for laryngeal carcinoma.  Now with trach, dysphagia (plan for PEG tube), new ESRD, afib with RVR.         Prior Functioning  Home Living Family/patient expects to be discharged to:: Private residence Living Arrangements: Spouse/significant other Available Help at Discharge: Family Type of Home: House Home Access: Stairs to enter Secretary/administrator of Steps: 6 Entrance Stairs-Rails: Left Home Layout: One level Home Equipment: None Prior Function Level of Independence: Independent Communication Communication: Passy-Muir valve Dominant Hand: Right    Cognition  Cognition Arousal/Alertness: Awake/alert Behavior During Therapy: WFL for tasks assessed/performed Overall Cognitive Status: Within Functional Limits for tasks assessed    Extremity/Trunk Assessment Upper Extremity Assessment Upper Extremity Assessment: Overall WFL for tasks assessed Lower Extremity Assessment Lower Extremity Assessment: Overall WFL for tasks assessed Cervical / Trunk Assessment Cervical / Trunk Assessment:  Normal   Balance Balance Balance Assessed: Yes Static Sitting Balance Static Sitting - Balance Support: Feet supported Static Sitting - Level of Assistance: 7: Independent  Static Sitting - Comment/# of Minutes: 5 Static Standing Balance Static Standing - Balance Support: No upper extremity supported Static Standing - Level of Assistance: 5: Stand by assistance Static Standing - Comment/# of Minutes: 1  End of Session PT - End of Session Equipment Utilized During Treatment: Gait belt;Oxygen (trach collar) Activity Tolerance: Patient limited by fatigue Patient left: in chair;with call bell/phone within reach Nurse Communication: Mobility status  GP     Victor Lane 02/16/2013, 4:51 PM  Victor Lane, PT DPT [phone]

## 2013-02-16 NOTE — Progress Notes (Signed)
Speech Language Pathology Dysphagia Treatment Patient Details Name: Victor Lane MRN: [id number] DOB: 1945/10/12 Today's Date: 02/16/2013 Time: 7829-5621 SLP Time Calculation (min): 88 min  Assessment / Plan / Recommendation Clinical Impression  Pt and wife were seen for education following FEES.  No PO trials were attempted, due to severity of dysphagia per FEES.  Discussed FEES results and recommendations, PMSV role in speech/swallow, ice chips following thorough oral care, and provided written oropharyngeal strengthening exercises.  Encouraged pt to complete exercises 2x/day, 6 days/week.  Resources given re: Managing speech and swallowing difficulties resulting from H&N CA.  Encouraged oral care with green sponges, using once and throwing away, for oral moisture.  RN present to complete oral care with Biotene cleaner/moisturizer.  Discussed likelihood of PEG placement for safe nutrition, hydration and medication, and recommendation for referral to outpatient neurorehab clinic for continued speech therapy intervention during and after radiation treatment.     Diet Recommendation  Continue with Current Diet: NPO    SLP Plan Continue with current plan of care   Pertinent Vitals/Pain No pain reported   Swallowing Goals   Pt will tolerate ice chips following thorough oral care with min assist.  General Temperature Spikes Noted: No Respiratory Status: Trach Behavior/Cognition: Alert;Cooperative;Requires cueing;Decreased sustained attention;Distractible (appears overwhelmed by amount of info being given) Oral Cavity - Dentition: Adequate natural dentition Patient Positioning: Upright in bed  Oral Cavity - Oral Hygiene Brush patient's teeth BID with toothbrush (using toothpaste with fluoride): Yes Patient is AT RISK - Oral Care Protocol followed (see row info): Yes   Dysphagia Treatment Treatment focused on: Patient/family/caregiver education Family/Caregiver Educated: wife Treatment  Methods/Modalities: Effortful swallow;Masako Maneuver Patient observed directly with PO's: No Reason PO's not observed: Other (comment) (unsafe per FEES)   GO   Kalaya Infantino B. Murvin [facility], CCC-SLP [phone]  Leigh Aurora 02/16/2013, 12:22 PM

## 2013-02-16 NOTE — Evaluation (Signed)
Occupational Therapy Evaluation Patient Details Name: Victor Lane MRN: [id number] DOB: [birth year] Today's Date: 02/16/2013 Time: 7829-5621 OT Time Calculation (min): 33 min  OT Assessment / Plan / Recommendation History of present illness Pt admitted with AMS and hypoxia.  Was undergoing radiation for laryngeal carcinoma.  Now with trach, dysphagia (plan for PEG tube), new ESRD, afib with RVR.     Clinical Impression   Pt is highly motivated and moving remarkably well, this being his first time OOB since admission.  ADL limited by impaired balance and generalized weakness.  Will follow to address ADL and transfers and determine any necessary DME needs for home    OT Assessment  Patient needs continued OT Services    Follow Up Recommendations  No OT follow up    Barriers to Discharge      Equipment Recommendations   (TBA)    Recommendations for Other Services    Frequency  Min 2X/week    Precautions / Restrictions Precautions Precautions: Fall Precaution Comments: multiple lines Restrictions Weight Bearing Restrictions: No   Pertinent Vitals/Pain 28% o2, VSS    ADL  Eating/Feeding: NPO Where Assessed - Eating/Feeding: Chair Grooming: Wash/dry hands;Wash/dry face;Set up Where Assessed - Grooming: Unsupported sitting Upper Body Bathing: Set up Where Assessed - Upper Body Bathing: Unsupported sitting Lower Body Bathing: Minimal assistance Where Assessed - Lower Body Bathing: Unsupported sitting;Supported sit to stand Upper Body Dressing: Set up Where Assessed - Upper Body Dressing: Unsupported sitting Lower Body Dressing: Minimal assistance Where Assessed - Lower Body Dressing: Unsupported sitting;Supported sit to stand Toilet Transfer: Minimal assistance Toilet Transfer Method: Sit to stand Equipment Used: Gait belt Transfers/Ambulation Related to ADLs: min assist bed to chair ADL Comments: pt able to access his feet with ease in sitting, fatigues in standing     OT Diagnosis: Generalized weakness  OT Problem List: Decreased strength;Decreased activity tolerance;Impaired balance (sitting and/or standing);Decreased knowledge of use of DME or AE;Cardiopulmonary status limiting activity OT Treatment Interventions: Self-care/ADL training;DME and/or AE instruction;Patient/family education;Balance training;Energy conservation   OT Goals(Current goals can be found in the care plan section) Acute Rehab OT Goals Patient Stated Goal: return home OT Goal Formulation: With patient Time For Goal Achievement: 03/02/13 Potential to Achieve Goals: Good ADL Goals Pt Will Perform Grooming: standing;with modified independence Pt Will Perform Lower Body Bathing: with modified independence;sit to/from stand Pt Will Perform Lower Body Dressing: with modified independence;sit to/from stand Pt Will Transfer to Toilet: with modified independence;ambulating Pt Will Perform Toileting - Clothing Manipulation and hygiene: with modified independence;sit to/from stand Pt Will Perform Tub/Shower Transfer: with modified independence;ambulating (determine DME needs)  Visit Information  Last OT Received On: 02/16/13 Assistance Needed: +2 (to ambulate, for lines) PT/OT Co-Evaluation/Treatment: Yes History of Present Illness: Pt admitted with AMS and hypoxia.  Was undergoing radiation for laryngeal carcinoma.  Now with trach, dysphagia (plan for PEG tube), new ESRD, afib with RVR.         Prior Functioning     Home Living Family/patient expects to be discharged to:: Private residence Living Arrangements: Spouse/significant other Available Help at Discharge: Family Type of Home: House Home Access: Stairs to enter Secretary/administrator of Steps: 6 Entrance Stairs-Rails: Left Home Layout: One level Home Equipment: None Prior Function Level of Independence: Independent Communication Communication: Passy-Muir valve Dominant Hand: Right         Vision/Perception  Vision - History Patient Visual Report: No change from baseline   Cognition  Cognition Arousal/Alertness: Awake/alert Behavior During Therapy: [facility]  for tasks assessed/performed Overall Cognitive Status: Within Functional Limits for tasks assessed    Extremity/Trunk Assessment Upper Extremity Assessment Upper Extremity Assessment: Overall WFL for tasks assessed Lower Extremity Assessment Lower Extremity Assessment: Defer to PT evaluation Cervical / Trunk Assessment Cervical / Trunk Assessment: Normal     Mobility Bed Mobility Bed Mobility: Supine to Sit;Sitting - Scoot to Edge of Bed Supine to Sit: 5: Supervision Sitting - Scoot to [place] of Bed: 5: Supervision Transfers Transfers: Sit to Stand;Stand to Sit Sit to Stand: 4: Min assist;With upper extremity assist;From bed Stand to Sit: 4: Min assist;With upper extremity assist;To chair/3-in-1     Exercise     Balance Balance Balance Assessed: Yes Static Sitting Balance Static Sitting - Balance Support: Feet supported Static Sitting - Level of Assistance: 7: Independent Static Sitting - Comment/# of Minutes: 5 Static Standing Balance Static Standing - Balance Support: No upper extremity supported Static Standing - Level of Assistance: 5: Stand by assistance Static Standing - Comment/# of Minutes: 1   End of Session OT - End of Session Activity Tolerance: Patient limited by fatigue Patient left: in chair;with call bell/phone within reach Nurse Communication: Mobility status  GO     Evern Bio 02/16/2013, 3:04 PM [phone]

## 2013-02-16 NOTE — Progress Notes (Signed)
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 Jearld Lane) ,undergoing RT Victor Lane) adm to [facility] 8/22 with confusion, dyspnea, hypoxia, hyperkalemia & AKI with cr 11/ BUN 101 - transferred to [facility] for rt IJ permacath placement then , emergent HD. PCCM consulted 8/22 pm for worsening dyspnea while on HD requiring bipap. CXR shows BL nodular infiltrates ? pna vs fluid  SIGNIFICANT EVENTS: 01/04/13 microlaryngoscopy - Victor Lane 8/22 Start HD, PCCM consulted for dyspnea, SVT 8/24 Failed BiPAP >> VDRF, ENT consulted 8/26 Perc trach by Victor Lane >> #8 trach 8/27 Off vent, transfer to telemetry  STUDIES:    LINES / TUBES: Rt IJ permcath (IR) 8/22 >> Lt IJ CVL 8/24 >> 8/28 ETT 8/24 >> 8/26 Trach (Victor Lane) 8/26 >>  CULTURES: BCx2 8/22 >> Trach asp 8/24>>oral flora  ANTIBIOTICS: Zithromax 8/22 >> 8/22 Aztreonam 8/22 >> 8/22 Ancef 8/22 >> 8/22 Rocephin 8/22 >> 8/22 Vancomycin 8/22 >> 8/27 Cefepime 8/23 >> 8/27  SUBJECTIVE:  Looks good. No new complaints. Well oriented. No distress  VITAL SIGNS: Temp:  [98.2 F (36.8 C)-98.8 F (37.1 C)] 98.3 F (36.8 C) (08/28 2022) Pulse Rate:  [86-97] 86 (08/28 2022) Resp:  [14-18] 18 (08/28 2022) BP: (156-175)/(67-95) 169/81 mmHg (08/28 2022) SpO2:  [90 %-100 %] 100 % (08/28 2022) FiO2 (%):  [28 %] 28 % (08/28 2022) Weight:  [84.5 kg (186 lb 4.6 oz)] 84.5 kg (186 lb 4.6 oz) (08/28 0624)  INTAKE / OUTPUT: Intake/Output     08/28 0701 - 08/29 0700   I.V. (mL/kg)    Total Intake(mL/kg)    Urine (mL/kg/hr) 650 (0.5)   Other    Stool 200 (0.1)   Total Output 850   Net -850         PHYSICAL EXAMINATION: General: No  distress Neuro: Alert, normal strength, follows commands HEENT: trach site clean Cardiovascular: regular, no murmur Lungs: scattered rhonchi Abdomen:  Soft, non tender Musculoskeletal:  No edema Skin:  No rash  LABS:  CBC Recent Labs     02/14/13  0500  02/15/13  0500  02/16/13  0400  WBC  6.4  9.3  9.4  HGB  7.7*  8.2*  8.4*  HCT  23.1*  25.5*  26.2*  PLT  137*  143*  166   Coag's No results found for this basename: APTT, INR,  in the last 72 hours  BMET Recent Labs     02/14/13  0500  02/15/13  0500  02/16/13  0400  NA  138  138  142  K  3.6  3.7  4.0  CL  100  99  104  CO2  29  25  24   BUN  51*  74*  49*  CREATININE  5.22*  6.54*  5.29*  GLUCOSE  119*  129*  125*   Electrolytes Recent Labs     02/14/13  0500  02/15/13  0500  02/16/13  0400  CALCIUM  7.5*  7.8*  8.2*  PHOS   --    --   5.1*   ABG No results found for this basename: PHART, PCO2ART, PO2ART,  in the last 72 hours Liver Enzymes Recent  Labs     02/16/13  0400  ALBUMIN  1.9*   Glucose Recent Labs     02/15/13  2033  02/15/13  2355  02/16/13  0423  02/16/13  0831  02/16/13  1120  02/16/13  1700  GLUCAP  116*  118*  114*  110*  107*  109*    Imaging Dg Chest Port 1 View  02/16/2013   *RADIOLOGY REPORT*  Clinical Data: Pulmonary edema  PORTABLE CHEST - 1 VIEW  Comparison: February 15, 2013.  Findings: Stable cardiomediastinal silhouette. Tracheostomy tube and right internal jugular dialysis catheter unchanged in position. Left internal jugular venous catheter is unchanged in position with distal tip in the expected position of the left innominate vein. There appears to be slight improvement in the bilateral perihilar and basilar edema compared to prior exam.  No definite pleural effusion is noted.  No pneumothorax is seen.  IMPRESSION: Stable support apparatus as described above.  Slightly improved bilateral perihilar and basilar edema compared to prior exam.   Original Report  Authenticated By: Lupita Raider.,  M.D.   Dg Chest Port 1 View  02/15/2013   *RADIOLOGY REPORT*  Clinical Data: Follow up pneumonia  PORTABLE CHEST - 1 VIEW  Comparison: Prior chest x-ray 02/14/2013  Findings: The nasogastric tube is been removed.  Tracheostomy tube remains in good position with the tip midline and at the level of the clavicles.  Right IJ tunneled hemodialysis catheter is in unchanged position.  The catheter tips project over the mid and distal SVC.  A left IJ approach central venous catheter terminates with its tip in the horizontal segment of the brachiocephalic vein. Stable cardiomegaly.  Aortic atherosclerosis noted.  Similar degree of pulmonary vascular congestion and mild interstitial edema. Right slightly greater than left bibasilar opacities are nonspecific.  No pneumothorax.  IMPRESSION:  1.  Stable to slightly increased pulmonary vascular congestion and interstitial edema. 2.  Right greater than left bibasilar opacities may reflect atelectasis and / or asymmetric edema versus right basilar pneumonia. 3.  The nasogastric tube has been removed. 4.  The tip of the left IJ central venous catheter remains within the left innominate vein.   Original Report Authenticated By: Malachy Moan, M.D.     ASSESSMENT / PLAN:  PULMONARY A: Acute respiratory failure 2nd to aspiration pneumonia  Laryngeal cancer Trach status P:   continue trach collar Cont BDs  CARDIOVASCULAR A: A fib with RVR >> NSR Hx of HTN. P:  DC dilt infusion Cont metoprolol  RENAL A:  ESRD on HD. Hyperkalemia >> resolved. P:   -HD per renal -d/c foley and place condom cath  GASTROINTESTINAL A: Dysphagia. Hx of GERD. Diarrhea >> improving. P:   DC rectal tube For G tube per IR 8/29  HEMATOLOGIC A: Anemia of critical illness and chronic disease. Hx of laryngeal cancer s/p XRT. P:  -f/u CBC -transfuse for Hb < 7 -aranesp per renal -SQ heparin for DVT prevention  INFECTIOUS A:  HCAP,  aspiration P:   Micro and abx as above  ENDOCRINE A:  DM-2   P:   Holding glyburide Not requiring coverage Cont CBGs Resume SSI for glu > 180  NEUROLOGIC A:  Encephalopathy d/t uremia and hypoxemia,  resolved. Pain Deconditioning. P:   Cont prn fentanyl Cont PT/OT  TODAY'S SUMMARY:    Billy Fischer, MD ; [facility] service Mobile [phone].  After 5:30 PM or weekends, call [phone]

## 2013-02-16 NOTE — Progress Notes (Signed)
Renal Service Daily Progress Note  Subjective: in good spirits today, wondering about nutrtition- ?feeding tube, IV nutrition, etc. CXR showed today clearing of vasc congestion  Physical Exam:  Blood pressure 156/67, pulse 91, temperature 98.8 F (37.1 C), temperature source Oral, resp. rate 16, height 5\' 10"  (1.778 m), weight 84.5 kg (186 lb 4.6 oz), SpO2 90.00%. Gen: up in bed, alert, trach in place, L IJ triple-lumen Heart: RRR no M or rub Lungs: clear bilat Abd: nondistended, nontender, no ascites Ext: no LE edema Access:  R IJ TDC   Assessment: 1. CKD, new ESRD- new start, permcath by IR 8/22. Creat was 7.8 in July prior to starting XRT so this is ESRD.  2. Dysphagia- due to radiation side effects, needs PEG tube preferably before the weekend. History of PEG does not preclude doing PD later on, but of course they can't both be done at the same time, patient advised concerning this. If dysphagia is going to be a long-term problem, we may need to commit to hemodialysis with placement of vasc access 3. HTN/vol- cxr clear today, will remove more volume with HD tomorrow. On IV metoprolol. Was on norvasc, metoprolol and cardura at home. 4. Anemia- Hb 8.2, transfuse prn, s/p darbe 100ug 8/23, no Fe d/t ^ferritin 5. VDRF / hx laryngeal Ca- for trach 6. AMS- resolved 7. HCAP- on maxipime 8. Laryngeal Ca- has finished 3 of 6 wks XRT 9. Afib / RVR- on diltiazem drip, BB 10. Nutrition- alb 1.8  Plan- HD am, IR consulted for PEG tube, vein mapping for AV fistula   Vinson Moselle  MD Pager [phone]    Cell  [phone] 02/16/2013, 11:23 AM    Recent Labs Lab 02/10/13 1326 02/10/13 1800  02/14/13 0500 02/15/13 0500 02/16/13 0400  NA 137  --   < > 138 138 142  K 6.9*  --   < > 3.6 3.7 4.0  CL 103  --   < > 100 99 104  CO2 17*  --   < > 29 25 24   GLUCOSE 217*  --   < > 119* 129* 125*  BUN 100*  --   < > 51* 74* 49*  CREATININE 10.44*  --   < > 5.22* 6.54* 5.29*  CALCIUM 8.1*  --    < > 7.5* 7.8* 8.2*  PHOS  --  8.4*  --   --   --  5.1*  < > = values in this interval not displayed.  Recent Labs Lab 02/10/13 1000 02/11/13 0520 02/13/13 0420 02/16/13 0400  AST 190* 194* 58*  --   ALT 155* 94* 10  --   ALKPHOS 84 54 60  --   BILITOT 0.3 0.2* 0.2*  --   PROT 6.9 5.4* 5.0*  --   ALBUMIN 2.7* 2.2* 1.8* 1.9*    Recent Labs Lab 02/10/13 1000  02/13/13 0420 02/14/13 0500 02/15/13 0500 02/16/13 0400  WBC 9.2  < > 7.6 6.4 9.3 9.4  NEUTROABS 8.0*  --  6.0  --   --   --   HGB 11.5*  < > 8.0* 7.7* 8.2* 8.4*  HCT 35.7*  < > 24.9* 23.1* 25.5* 26.2*  MCV 90.8  < > 90.5 91.3 91.4 91.9  PLT 211  < > 150 137* 143* 166  < > = values in this interval not displayed. Marland Kitchen antiseptic oral rinse  15 mL Mouth Rinse QID  . ceFEPime (MAXIPIME) IV  2 g Intravenous Q M,W,F-HD  .  chlorhexidine  15 mL Mouth Rinse BID  . darbepoetin (ARANESP) injection - DIALYSIS  100 mcg Intravenous Q Sat-HD  . etomidate  40 mg Intravenous Once  . heparin  5,000 Units Subcutaneous Q8H  . insulin aspart  0-9 Units Subcutaneous Q4H  . metoprolol  2.5 mg Intravenous Q6H  . pantoprazole (PROTONIX) IV  40 mg Intravenous Q24H  . sodium chloride  3 mL Intravenous Q12H   . diltiazem (CARDIZEM) infusion 5 mg/hr (02/15/13 1658)   sodium chloride, sodium chloride, fentaNYL, heparin, heparin, heparin, labetalol, levalbuterol, lidocaine (PF), lidocaine-prilocaine, ondansetron (ZOFRAN) IV, pentafluoroprop-tetrafluoroeth

## 2013-02-16 NOTE — Consult Note (Signed)
HPI: Victor Lane is an 67 y.o. male with ESRD who recently had HD catheter placed. Condition deteriorated and now has trach for VDRF. Significant dysphagia from recent radiation therapy of laryngeal cancer. Has become weak and malnourished. Has otherwise improved significantly since presented to [facility] ER on 8/22 IR is asked to place PEG tube for nutritional needs. Pt failed FEES test with significant aspiration risk. Had an NG but could not tolerate. Chart, status, PMHx, and meds reviewed.   Past Medical History:  Past Medical History  Diagnosis Date  . Diabetes mellitus   . Hypertension   . GERD (gastroesophageal reflux disease)   . Fibromyalgia   . Chest pain   . Carotid artery occlusion   . Chronic kidney disease     - Stage 5- follwed by Dr Hyman Hopes  . Complication of anesthesia     agiation when he awaken  . Neuropathy   . Arthritis   . Sleep apnea   . H/O vitamin D deficiency   . Hearing loss   . Hypercholesteremia   . Myalgia and myositis   . 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 Cholecystectomy  1993  . Rotator cuff repair  07/06/2011    right  . Carotid endarterectomy Left   . Coolonoscopy    . Colonoscopy w/ biopsies and polypectomy      Hx: of  . Microlaryngoscopy with laser N/A 01/04/2013    Procedure: MICROLARYNGOSCOPY WITH BIOPSY/LASER;  Surgeon: Suzanna Obey, MD;  Location: [facility] OR;  Service: ENT;  Laterality: N/A;    Family History:  Family History  Problem Relation Age of Onset  . Adopted: Yes    Social History:  reports that he quit smoking about 16 years ago. His smoking use included Cigarettes. He has a 62 pack-year smoking history. He has never used smokeless tobacco. He reports that he does not drink alcohol or use illicit drugs.  Allergies:  Allergies  Allergen Reactions  . Amoxicillin     Causes upset  stomach    Medications:   Medication List    ASK your doctor about these medications       amLODipine 10 MG tablet  Commonly known as:  NORVASC  Take 10 mg by mouth daily.     aspirin 81 MG tablet  Take 81 mg by mouth daily.     b complex vitamins capsule  Take 1 capsule by mouth daily.     calcium carbonate 1250 MG capsule  Take 1,250 mg by mouth 2 (two) times daily with a meal.     doxazosin 2 MG tablet  Commonly known as:  CARDURA  Take 2 mg by mouth at bedtime.     esomeprazole 40 MG capsule  Commonly known as:  NEXIUM  Take 40 mg by mouth daily before breakfast.     fentaNYL 25 MCG/HR patch  Commonly known as:  DURAGESIC - dosed mcg/hr  Place 1 patch onto the skin every 3 (three) days. Last patch was applied 8/19 at 1600     Fish Oil 1200 MG Cpdr  Take 2,400 mg by mouth.     furosemide 40 MG tablet  Commonly known as:  LASIX  Take 40 mg by mouth daily.     glipiZIDE 2.5 MG 24 hr tablet  Commonly known as:  GLUCOTROL XL  Take  2.5 mg by mouth daily.     magic mouthwash w/lidocaine Soln  1part nystatin,1part Maaloxplus,1part benadryl,3part 2%viscous lidocaine. Swallow 10 mL up to QID, before meals/bedtime     magnesium oxide 400 MG tablet  Commonly known as:  MAG-OX  Take 400 mg by mouth daily.     metoprolol tartrate 25 MG tablet  Commonly known as:  LOPRESSOR  Take 25 mg by mouth 2 (two) times daily.     morphine 20 MG/ML concentrated solution  Commonly known as:  ROXANOL  Take 10-20 mg by mouth every 2 (two) hours as needed for pain.     ranitidine 300 MG capsule  Commonly known as:  ZANTAC  Take 300 mg by mouth at bedtime.     sodium bicarbonate 325 MG tablet  Take 325 mg by mouth 3 (three) times daily.     traMADol 50 MG tablet  Commonly known as:  ULTRAM  Take 100 mg by mouth every 4 (four) hours as needed.     venlafaxine 37.5 MG tablet  Commonly known as:  EFFEXOR  Take 37.5 mg by mouth 2 (two) times daily.     Vitamin D  (Ergocalciferol) 50000 UNITS Caps capsule  Commonly known as:  DRISDOL  Take 50,000 Units by mouth every 14 (fourteen) days. Next dose is due august 30th        Please HPI for pertinent positives, otherwise complete 10 system ROS negative.  Physical Exam: BP 175/81  Pulse 90  Temp(Src) 98.2 F (36.8 C) (Oral)  Resp 16  Ht 5\' 10"  (1.778 m)  Wt 186 lb 4.6 oz (84.5 kg)  BMI 26.73 kg/m2  SpO2 97% Body mass index is 26.73 kg/(m^2).   General Appearance:  Alert, but fatigued male.  Head:  Normocephalic, without obvious abnormality, atraumatic  ENT: Unremarkable  Neck: Supple, symmetrical,  Tracheostomy intact, clean.  Lungs:   Clear to auscultation bilaterally, no w/r/r  Chest Wall:  Rt Permcath intact, site clean.  Heart:  Tachy regular rate and rhythm,  Abdomen:   Soft, non-tender, non distended.  Extremities: Extremities normal, atraumatic, no cyanosis or edema  Pulses: 2+ and symmetric  Neurologic: Normal affect, no gross deficits.   Results for orders placed during the hospital encounter of 02/10/13 (from the past 48 hour(s))  GLUCOSE, CAPILLARY     Status: Abnormal   Collection Time    02/14/13  4:03 PM      Result Value Range   Glucose-Capillary 134 (*) 70 - 99 mg/dL  GLUCOSE, CAPILLARY     Status: Abnormal   Collection Time    02/14/13  8:37 PM      Result Value Range   Glucose-Capillary 150 (*) 70 - 99 mg/dL  GLUCOSE, CAPILLARY     Status: Abnormal   Collection Time    02/14/13 11:40 PM      Result Value Range   Glucose-Capillary 151 (*) 70 - 99 mg/dL   Comment 1 Notify RN    GLUCOSE, CAPILLARY     Status: Abnormal   Collection Time    02/15/13  3:54 AM      Result Value Range   Glucose-Capillary 113 (*) 70 - 99 mg/dL   Comment 1 Notify RN    BASIC METABOLIC PANEL     Status: Abnormal   Collection Time    02/15/13  5:00 AM      Result Value Range   Sodium 138  135 - 145 mEq/L   Potassium 3.7  3.5 - 5.1 mEq/L   Chloride 99  96 - 112 mEq/L   CO2 25  19  - 32 mEq/L   Glucose, Bld 129 (*) 70 - 99 mg/dL   BUN 74 (*) 6 - 23 mg/dL   Creatinine, Ser 1.19 (*) 0.50 - 1.35 mg/dL   Calcium 7.8 (*) 8.4 - 10.5 mg/dL   GFR calc non Af Amer 8 (*) >90 mL/min   GFR calc Af Amer 9 (*) >90 mL/min   Comment: (NOTE)     The eGFR has been calculated using the CKD EPI equation.     This calculation has not been validated in all clinical situations.     eGFR's persistently <90 mL/min signify possible Chronic Kidney     Disease.  CBC     Status: Abnormal   Collection Time    02/15/13  5:00 AM      Result Value Range   WBC 9.3  4.0 - 10.5 K/uL   RBC 2.79 (*) 4.22 - 5.81 MIL/uL   Hemoglobin 8.2 (*) 13.0 - 17.0 g/dL   HCT 14.7 (*) 82.9 - 56.2 %   MCV 91.4  78.0 - 100.0 fL   MCH 29.4  26.0 - 34.0 pg   MCHC 32.2  30.0 - 36.0 g/dL   RDW 13.0  86.5 - 78.4 %   Platelets 143 (*) 150 - 400 K/uL  VANCOMYCIN, RANDOM     Status: None   Collection Time    02/15/13  5:00 AM      Result Value Range   Vancomycin Rm 20.6     Comment:            Random Vancomycin therapeutic     range is dependent on dosage and     time of specimen collection.     A peak range is 20.0-40.0 ug/mL     A trough range is 5.0-15.0 ug/mL             TRIGLYCERIDES     Status: Abnormal   Collection Time    02/15/13  5:00 AM      Result Value Range   Triglycerides 434 (*) <150 mg/dL  GLUCOSE, CAPILLARY     Status: Abnormal   Collection Time    02/15/13 11:03 AM      Result Value Range   Glucose-Capillary 114 (*) 70 - 99 mg/dL  GLUCOSE, CAPILLARY     Status: Abnormal   Collection Time    02/15/13 12:03 PM      Result Value Range   Glucose-Capillary 117 (*) 70 - 99 mg/dL  GLUCOSE, CAPILLARY     Status: Abnormal   Collection Time    02/15/13  5:14 PM      Result Value Range   Glucose-Capillary 122 (*) 70 - 99 mg/dL   Comment 1 Documented in Chart     Comment 2 Notify RN    GLUCOSE, CAPILLARY     Status: Abnormal   Collection Time    02/15/13  8:33 PM      Result Value Range    Glucose-Capillary 116 (*) 70 - 99 mg/dL   Comment 1 Documented in Chart     Comment 2 Notify RN    GLUCOSE, CAPILLARY     Status: Abnormal   Collection Time    02/15/13 11:55 PM      Result Value Range   Glucose-Capillary 118 (*) 70 - 99 mg/dL  RENAL FUNCTION PANEL     Status:  Abnormal   Collection Time    02/16/13  4:00 AM      Result Value Range   Sodium 142  135 - 145 mEq/L   Potassium 4.0  3.5 - 5.1 mEq/L   Chloride 104  96 - 112 mEq/L   CO2 24  19 - 32 mEq/L   Glucose, Bld 125 (*) 70 - 99 mg/dL   BUN 49 (*) 6 - 23 mg/dL   Comment: DELTA CHECK NOTED   Creatinine, Ser 5.29 (*) 0.50 - 1.35 mg/dL   Calcium 8.2 (*) 8.4 - 10.5 mg/dL   Phosphorus 5.1 (*) 2.3 - 4.6 mg/dL   Albumin 1.9 (*) 3.5 - 5.2 g/dL   GFR calc non Af Amer 10 (*) >90 mL/min   GFR calc Af Amer 12 (*) >90 mL/min   Comment: (NOTE)     The eGFR has been calculated using the CKD EPI equation.     This calculation has not been validated in all clinical situations.     eGFR's persistently <90 mL/min signify possible Chronic Kidney     Disease.  CBC     Status: Abnormal   Collection Time    02/16/13  4:00 AM      Result Value Range   WBC 9.4  4.0 - 10.5 K/uL   RBC 2.85 (*) 4.22 - 5.81 MIL/uL   Hemoglobin 8.4 (*) 13.0 - 17.0 g/dL   HCT 25.3 (*) 66.4 - 40.3 %   MCV 91.9  78.0 - 100.0 fL   MCH 29.5  26.0 - 34.0 pg   MCHC 32.1  30.0 - 36.0 g/dL   RDW 47.4  25.9 - 56.3 %   Platelets 166  150 - 400 K/uL  GLUCOSE, CAPILLARY     Status: Abnormal   Collection Time    02/16/13  4:23 AM      Result Value Range   Glucose-Capillary 114 (*) 70 - 99 mg/dL  GLUCOSE, CAPILLARY     Status: Abnormal   Collection Time    02/16/13  8:31 AM      Result Value Range   Glucose-Capillary 110 (*) 70 - 99 mg/dL   Comment 1 Notify RN     Comment 2 Documented in Chart    GLUCOSE, CAPILLARY     Status: Abnormal   Collection Time    02/16/13 11:20 AM      Result Value Range   Glucose-Capillary 107 (*) 70 - 99 mg/dL   Comment 1  Notify RN     Comment 2 Documented in Chart     Dg Chest Port 1 View  02/16/2013   *RADIOLOGY REPORT*  Clinical Data: Pulmonary edema  PORTABLE CHEST - 1 VIEW  Comparison: February 15, 2013.  Findings: Stable cardiomediastinal silhouette. Tracheostomy tube and right internal jugular dialysis catheter unchanged in position. Left internal jugular venous catheter is unchanged in position with distal tip in the expected position of the left innominate vein. There appears to be slight improvement in the bilateral perihilar and basilar edema compared to prior exam.  No definite pleural effusion is noted.  No pneumothorax is seen.  IMPRESSION: Stable support apparatus as described above.  Slightly improved bilateral perihilar and basilar edema compared to prior exam.   Original Report Authenticated By: Lupita Raider.,  M.D.   Dg Chest Port 1 View  02/15/2013   *RADIOLOGY REPORT*  Clinical Data: Follow up pneumonia  PORTABLE CHEST - 1 VIEW  Comparison: Prior chest x-ray 02/14/2013  Findings:  The nasogastric tube is been removed.  Tracheostomy tube remains in good position with the tip midline and at the level of the clavicles.  Right IJ tunneled hemodialysis catheter is in unchanged position.  The catheter tips project over the mid and distal SVC.  A left IJ approach central venous catheter terminates with its tip in the horizontal segment of the brachiocephalic vein. Stable cardiomegaly.  Aortic atherosclerosis noted.  Similar degree of pulmonary vascular congestion and mild interstitial edema. Right slightly greater than left bibasilar opacities are nonspecific.  No pneumothorax.  IMPRESSION:  1.  Stable to slightly increased pulmonary vascular congestion and interstitial edema. 2.  Right greater than left bibasilar opacities may reflect atelectasis and / or asymmetric edema versus right basilar pneumonia. 3.  The nasogastric tube has been removed. 4.  The tip of the left IJ central venous catheter remains within  the left innominate vein.   Original Report Authenticated By: Malachy Moan, M.D.    Assessment/Plan Dysphagia from radiation therapy PCM and FTT Discussed G-tube placement. Explained procedure, risks, complications, use of sedation He cannot swallow contrast and does not want to have NGT placed again. Discussed that procedure may be possible without use of contrast but also may need barium enema if better visualization is needed. Consent signed in chart by wife.  Brayton El PA-C 02/16/2013, 2:06 PM

## 2013-02-16 NOTE — Progress Notes (Signed)
Rectal flexiseal d/c'd intact. Patient tolerated well. PT to room to evaluate patient. Will continue to monitor. Mamie Levers

## 2013-02-17 ENCOUNTER — Inpatient Hospital Stay (HOSPITAL_COMMUNITY): Payer: Medicare Other

## 2013-02-17 ENCOUNTER — Telehealth: Payer: Self-pay | Admitting: *Deleted

## 2013-02-17 ENCOUNTER — Ambulatory Visit: Payer: Medicare Other

## 2013-02-17 DIAGNOSIS — N186 End stage renal disease: Secondary | ICD-10-CM

## 2013-02-17 LAB — RENAL FUNCTION PANEL
BUN: 77 mg/dL — ABNORMAL HIGH (ref 6–23)
CO2: 21 mEq/L (ref 19–32)
Calcium: 8.3 mg/dL — ABNORMAL LOW (ref 8.4–10.5)
Creatinine, Ser: 7.4 mg/dL — ABNORMAL HIGH (ref 0.50–1.35)
GFR calc Af Amer: 8 mL/min — ABNORMAL LOW (ref >90)
Glucose, Bld: 111 mg/dL — ABNORMAL HIGH (ref 70–99)
Phosphorus: 5.7 mg/dL — ABNORMAL HIGH (ref 2.3–4.6)

## 2013-02-17 LAB — CULTURE, BLOOD (ROUTINE X 2): Culture: NO GROWTH

## 2013-02-17 LAB — CBC
HCT: 26 % — ABNORMAL LOW (ref 39.0–52.0)
Hemoglobin: 8.2 g/dL — ABNORMAL LOW (ref 13.0–17.0)
MCH: 28.9 pg (ref 26.0–34.0)
MCHC: 31.5 g/dL (ref 30.0–36.0)
MCV: 91.5 fL (ref 78.0–100.0)
RBC: 2.84 MIL/uL — ABNORMAL LOW (ref 4.22–5.81)

## 2013-02-17 LAB — GLUCOSE, CAPILLARY: Glucose-Capillary: 90 mg/dL (ref 70–99)

## 2013-02-17 MED ORDER — NEPRO/CARBSTEADY PO LIQD: 1000.0000 mL | ORAL | Status: DC

## 2013-02-17 MED ORDER — ALTEPLASE 2 MG IJ SOLR
2.0000 mg | Freq: Once | INTRAMUSCULAR | Status: DC | PRN
  Filled 2013-02-17: qty 2

## 2013-02-17 MED ORDER — MIDAZOLAM HCL 2 MG/2ML IJ SOLN
INTRAMUSCULAR | Status: AC | PRN
  Administered 2013-02-17: 1 mg via INTRAVENOUS

## 2013-02-17 MED ORDER — FENTANYL CITRATE 0.05 MG/ML IJ SOLN
INTRAMUSCULAR | Status: AC | PRN
  Administered 2013-02-17: 50 ug via INTRAVENOUS
  Administered 2013-02-17: 25 ug via INTRAVENOUS

## 2013-02-17 MED ORDER — PENTAFLUOROPROP-TETRAFLUOROETH EX AERO: 1.0000 "application " | INHALATION_SPRAY | CUTANEOUS | Status: DC | PRN

## 2013-02-17 MED ORDER — NEPRO/CARBSTEADY PO LIQD
1000.0000 mL | ORAL | Status: DC
  Administered 2013-02-18 – 2013-02-22 (×3): 1000 mL
  Filled 2013-02-17 (×7): qty 1000

## 2013-02-17 MED ORDER — NEPRO/CARBSTEADY PO LIQD
237.0000 mL | ORAL | Status: DC | PRN
  Filled 2013-02-17: qty 237

## 2013-02-17 MED ORDER — SODIUM CHLORIDE 0.9 % IV SOLN: 100.0000 mL | INTRAVENOUS | Status: DC | PRN

## 2013-02-17 MED ORDER — LIDOCAINE-PRILOCAINE 2.5-2.5 % EX CREA: 1.0000 "application " | TOPICAL_CREAM | CUTANEOUS | Status: DC | PRN

## 2013-02-17 MED ORDER — HEPARIN SODIUM (PORCINE) 1000 UNIT/ML DIALYSIS: 1000.0000 [IU] | INTRAMUSCULAR | Status: DC | PRN

## 2013-02-17 MED ORDER — IOHEXOL 300 MG/ML  SOLN
50.0000 mL | Freq: Once | INTRAMUSCULAR | Status: AC | PRN
  Administered 2013-02-17: 20 mL via INTRAVENOUS

## 2013-02-17 MED ORDER — LIDOCAINE HCL (PF) 1 % IJ SOLN: 5.0000 mL | INTRAMUSCULAR | Status: DC | PRN

## 2013-02-17 NOTE — Progress Notes (Signed)
PT Cancellation Note  Patient Details Name: Victor Lane MRN: [id number] DOB: [birth year]   Cancelled Treatment:    Reason Eval/Treat Not Completed: Patient at procedure or test/unavailable (in HD)   Toney Sang [facility] 02/17/2013, 7:28 AM  [name], PT [phone]

## 2013-02-17 NOTE — Procedures (Signed)
I was present at this dialysis session. I have reviewed the session itself and made appropriate changes.   Vinson Moselle  MD Pager [phone]    Cell  [phone] 02/17/2013, 11:50 AM

## 2013-02-17 NOTE — Progress Notes (Signed)
Unable to do 0000 trach check due to being on another unit with a new vent pt.

## 2013-02-17 NOTE — Procedures (Signed)
Successful 20FR PULL THROUGH G TUBE NO COMP STABLE FULL REPORT IN PACS

## 2013-02-17 NOTE — Progress Notes (Signed)
Returned from dialysis. Patient incontinent of stool d/t coughing. Assisted to bedside commode. Patient bathed with gown and linens changed. Returned to bed. Trach intact; remains on 28% trach collar. Patient transported via bed along with O2 and RN to interventional radiology. CMT notified. Report/care released to IR RN for G-tube placement.Victor Lane

## 2013-02-17 NOTE — Progress Notes (Signed)
G Tube placement checked and hooked to low intermittent suction. Light yellow bile noted in suction container.  Abdominal binder applied. Will continue to monitor pt closely.

## 2013-02-17 NOTE — Progress Notes (Signed)
Trach inner cannula changed and site cleansed. Passy Muir valve returned in place. Pt remains on 28% FiO2. Will continue to monitor pt closely.

## 2013-02-17 NOTE — Progress Notes (Signed)
Subjective:   Starting to feel better, wants to get out of here. IR for PEG tube and vein mapping today  Objective Filed Vitals:   02/17/13 0900 02/17/13 0930 02/17/13 1000 02/17/13 1030  BP: 165/82 160/86 112/76 117/72  Pulse: 90 103 107 108  Temp:      TempSrc:      Resp:      Height:      Weight:      SpO2:       Physical Exam General: Alert and oriented. On HD. No acute distress.  Heart: RRR Lungs: clear, unlabored. Trach in place. Dry cough Abdomen: soft nontender, nondisted. +BS Extremities: No LE edema Dialysis Access: R IJ cath (placed 8/22)  Assessment/Plan: 1. ESRD - new HD start 8/22. HD today. K+ 3.8 2. 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 For PEG tube placement today- ancef once  3. Anemia - hgb 8.2, aranesp 4. Secondary hyperparathyroidism - Ca+ 8.3. Phose 5.7 5. HTN/volume -  BP112/76 6. Nutrition -  NPO for IR procedure.  7. Afib- metoprolol 8. Aspiration pneumonia vs pulm edema- s/p abx , blood and sputum cx's neg  Jetty Duhamel, NP [facility] Beeper [phone] 02/17/2013,10:42 AM  LOS: 7 days   Patient seen and examined.  Agree with assessment and plan as above.  Have ordered vein mapping and will ask surgeon's to see patient for AVF Vinson Moselle  MD Pager [phone]    Cell  [phone] 02/17/2013, 11:48 AM     Additional Objective Labs: Basic Metabolic Panel:  Recent Labs Lab 02/10/13 1800  02/15/13 0500 02/16/13 0400 02/17/13 0708  NA  --   < > 138 142 143  K  --   < > 3.7 4.0 3.8  CL  --   < > 99 104 103  CO2  --   < > 25 24 21   GLUCOSE  --   < > 129* 125* 111*  BUN  --   < > 74* 49* 77*  CREATININE  --   < > 6.54* 5.29* 7.40*  CALCIUM  --   < > 7.8* 8.2* 8.3*  PHOS 8.4*  --   --  5.1* 5.7*  < > = values in this interval not displayed. Liver Function Tests:  Recent Labs Lab 02/11/13 0520 02/13/13 0420 02/16/13 0400 02/17/13 0708  AST 194* 58*  --   --   ALT 94* 10  --   --   ALKPHOS 54 60   --   --   BILITOT 0.2* 0.2*  --   --   PROT 5.4* 5.0*  --   --   ALBUMIN 2.2* 1.8* 1.9* 1.9*   No results found for this basename: LIPASE, AMYLASE,  in the last 168 hours CBC:  Recent Labs Lab 02/13/13 0420 02/14/13 0500 02/15/13 0500 02/16/13 0400 02/17/13 0708  WBC 7.6 6.4 9.3 9.4 10.9*  NEUTROABS 6.0  --   --   --   --   HGB 8.0* 7.7* 8.2* 8.4* 8.2*  HCT 24.9* 23.1* 25.5* 26.2* 26.0*  MCV 90.5 91.3 91.4 91.9 91.5  PLT 150 137* 143* 166 209   Blood Culture    Component Value Date/Time   SDES TRACHEAL ASPIRATE 02/12/2013 1133   SPECREQUEST Normal 02/12/2013 1133   CULT  Value: Non-Pathogenic Oropharyngeal-type Flora Isolated. Performed at [facility] 02/12/2013 1133   REPTSTATUS 02/15/2013 FINAL 02/12/2013 1133    Cardiac Enzymes: No results found for this basename: CKTOTAL, CKMB,  CKMBINDEX, TROPONINI,  in the last 168 hours CBG:  Recent Labs Lab 02/16/13 0423 02/16/13 0831 02/16/13 1120 02/16/13 1700 02/16/13 2345  GLUCAP 114* 110* 107* 109* 102*   Iron Studies: No results found for this basename: IRON, TIBC, TRANSFERRIN, FERRITIN,  in the last 72 hours @lablastinr3 @ Studies/Results: Dg Chest Port 1 View  02/16/2013   *RADIOLOGY REPORT*  Clinical Data: Pulmonary edema  PORTABLE CHEST - 1 VIEW  Comparison: February 15, 2013.  Findings: Stable cardiomediastinal silhouette. Tracheostomy tube and right internal jugular dialysis catheter unchanged in position. Left internal jugular venous catheter is unchanged in position with distal tip in the expected position of the left innominate vein. There appears to be slight improvement in the bilateral perihilar and basilar edema compared to prior exam.  No definite pleural effusion is noted.  No pneumothorax is seen.  IMPRESSION: Stable support apparatus as described above.  Slightly improved bilateral perihilar and basilar edema compared to prior exam.   Original Report Authenticated By: Lupita Raider.,  M.D.    Medications:   . antiseptic oral rinse  15 mL Mouth Rinse QID  .  ceFAZolin (ANCEF) IV  2 g Intravenous Once  . chlorhexidine  15 mL Mouth Rinse BID  . darbepoetin (ARANESP) injection - DIALYSIS  100 mcg Intravenous Q Sat-HD  . heparin  5,000 Units Subcutaneous Q8H  . metoprolol  5 mg Intravenous Q6H  . sodium chloride  3 mL Intravenous Q12H

## 2013-02-17 NOTE — Progress Notes (Signed)
SPEECH PATHOLOGY NOTE  Spoke with Dr. Sung Amabile re: cancelled order. He reported wanting ST to continue following pt, but was trying to "clean up the order set".  Explained to Dr. Sung Amabile that cancelling order discontinues our ability to see patient.  Verbal order received to resume ST services.  Formal reevaluation not necessary at this time - will proceed with plan of care established by initial examinations (FEES, PMSV).  Victor Lane [facility], CCC-SLP [phone]

## 2013-02-17 NOTE — Progress Notes (Signed)
PT Cancellation Note  Patient Details Name: Victor Lane MRN: [id number] DOB: 06-17-1946   Cancelled Treatment:    Reason Eval/Treat Not Completed: Patient at procedure or test/unavailable (OOR for stress test. will attempt at later date)   [name] 02/17/2013, 1:11 PM [name], PT [phone]

## 2013-02-17 NOTE — Telephone Encounter (Signed)
Received call from patient's wife who shared a positive update on her husband's condition.  I thanked her for the call and she expressed that it meant a lot to her to be able to call me.   Young Berry, RN, BSN, [facility] Navigator [phone]

## 2013-02-17 NOTE — Progress Notes (Signed)
Speech Language Pathology Dysphagia Treatment Patient Details Name: Victor Lane MRN: [id number] DOB: 09-Apr-1946 Today's Date: 02/17/2013 Time: 1200-1230 SLP Time Calculation (min): 30 min  Assessment / Plan / Recommendation Clinical Impression  Pt seen at bedside for continued education.  Pt was given handouts on xerostomia and effects of radiation on speech/swallowing.  Spoke with Dr. Sung Amabile as well, and requested referral to Verdie Mosher at [facility] at [place].  Pt continues to tolerate PMSV very well with adequate voicing and breath control.  Pt reported completing some of the swallowing exercises last night, but indicated being tired thus far today.  Encouraged continued efforts.  ST to follow for education.    Diet Recommendation  Continue with Current Diet: NPO    SLP Plan Continue with current plan of care   Pertinent Vitals/Pain No pain reported   Swallowing Goals  SLP Swallowing Goals Goal #3: Pt will exhibit independence with HEP. Swallow Study Goal #3 - Progress: Progressing toward goal  General Temperature Spikes Noted: No Respiratory Status: Trach (PMSV in place) Behavior/Cognition: Alert;Cooperative;Requires cueing;Decreased sustained attention;Distractible Patient Positioning: Partially reclined  Oral Cavity - Oral Hygiene Does patient have any of the following "at risk" factors?: Oxygen therapy - cannula, mask, simple oxygen devices Brush patient's teeth BID with toothbrush (using toothpaste with fluoride): Yes Patient is AT RISK - Oral Care Protocol followed (see row info): Yes   Dysphagia Treatment Treatment focused on: Patient/family/caregiver education Patient observed directly with PO's: No   GO   Victor Lane B. Murvin Natal [facility], CCC-SLP [phone]  Leigh Aurora 02/17/2013, 12:40 PM

## 2013-02-17 NOTE — Consult Note (Signed)
[facility] [place] CONSULT NOTE Reason for Consult: ESRD Referring Physician: Dr. Schertz  History of Present Illness: Victor Lane is a 67 y.o. 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 We were asked to evaluate for permanent access.  Pt is RHD and states his BP is higher on the left as compared to the right.  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  Additional Present medical problems: DM, HTN, Carotid occlusive disease, GERD - all medically managed   Current Facility-Administered Medications  Medication Dose Route Frequency Provider Last Rate Last Dose  . 0.9 %  sodium chloride infusion  100 mL Intravenous PRN Robert D Schertz, MD      . 0.9 %  sodium chloride infusion  100 mL Intravenous PRN Robert D Schertz, MD      . antiseptic oral rinse (BIOTENE) solution 15 mL  15 mL Mouth Rinse QID Elizabeth C Deterding, MD   15 mL at 02/17/13 0400  . chlorhexidine (PERIDEX) 0.12 % solution 15 mL  15 mL Mouth Rinse BID Elizabeth C Deterding, MD   15 mL at 02/16/13 1944  . darbepoetin (ARANESP) injection 100 mcg  100 mcg Intravenous Q Sat-HD Kellie A Goldsborough, MD   100 mcg at 02/11/13 1317  . fentaNYL (SUBLIMAZE) injection 12.5-25 mcg  12.5-25 mcg Intravenous Q2H PRN David B Simonds, MD      . heparin injection 1,000 Units  1,000 Units Dialysis PRN Robert D Schertz, MD      . heparin injection 1,800 Units  20 Units/kg Dialysis PRN Kellie A Goldsborough, MD      . heparin injection 5,000 Units  5,000 Units Subcutaneous Q8H Iskra M Myers, MD   5,000 Units at 02/16/13 2121  . levalbuterol (XOPENEX) nebulizer solution 0.63 mg  0.63 mg Nebulization Q3H PRN Vineet Sood, MD   0.63 mg at 02/17/13 0426  . lidocaine (PF) (XYLOCAINE) 1 % injection 5 mL  5 mL Intradermal PRN Robert D Schertz, MD      . lidocaine-prilocaine  (EMLA) cream 1 application  1 application Topical PRN Robert D Schertz, MD      . metoprolol (LOPRESSOR) injection 5 mg  5 mg Intravenous Q6H Rakesh V Alva, MD   5 mg at 02/17/13 0559  . ondansetron (ZOFRAN) injection 4 mg  4 mg Intravenous Q6H PRN Iskra M Myers, MD      . pentafluoroprop-tetrafluoroeth (GEBAUERS) aerosol 1 application  1 application Topical PRN Robert D Schertz, MD      . sodium chloride 0.9 % injection 3 mL  3 mL Intravenous Q12H Iskra M Myers, MD   3 mL at 02/16/13 2121   Facility-Administered Medications Ordered in Other Encounters  Medication Dose Route Frequency Provider Last Rate Last Dose  . fentaNYL (SUBLIMAZE) injection   Intravenous PRN Michael Shick, MD   25 mcg at 02/17/13 1333  . midazolam (VERSED) injection   Intravenous PRN Michael Shick, MD   1 mg at 02/17/13 1327   Pt meds include: Statin :No Betablocker: Yes ASA: No Other anticoagulants/antiplatelets: none  Past Medical History  Diagnosis Date  . Diabetes mellitus   . Hypertension   . GERD (gastroesophageal reflux disease)   . Fibromyalgia   . Chest pain   . Carotid artery occlusion   . Chronic kidney disease     -   Stage 5- follwed by Dr Webb  . Complication of anesthesia     agiation when he awaken  . Neuropathy   . Arthritis   . Sleep apnea   . H/O vitamin D deficiency   . Hearing loss   . Hypercholesteremia   . Myalgia and myositis   . 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 Cholecystectomy  1993  . Rotator cuff repair  07/06/2011    right  . Carotid endarterectomy Left   . Coolonoscopy    . Colonoscopy w/ biopsies and polypectomy      Hx: of  . Microlaryngoscopy with laser N/A 01/04/2013    Procedure: MICROLARYNGOSCOPY WITH BIOPSY/LASER;  Surgeon: John Byers, MD;  Location: [facility] OR;  Service: ENT;  Laterality: N/A;    Social History History  Substance Use Topics  .  Smoking status: Former Smoker -- 2.00 packs/day for 31 years    Types: Cigarettes    Quit date: 06/22/1996  . Smokeless tobacco: Never Used  . Alcohol Use: No    Family History Family History  Problem Relation Age of Onset  . Adopted: Yes    Allergies  Allergen Reactions  . Amoxicillin     Causes upset stomach     REVIEW OF SYSTEMS  General: [ ] Weight loss, [ ] Fever, [ ] chills Neurologic: [ ] Dizziness, [ ] Blackouts, [ ] Seizure [ ] Stroke, [ ] "Mini stroke", [ ] Slurred speech, [ ] Temporary blindness; [ ] weakness in arms or legs, [ ] Hoarseness [x ] Dysphagia Cardiac: [ ] Chest pain/pressure, [ ] Shortness of breath at rest [ ] Shortness of breath with exertion, [ ] Atrial fibrillation or irregular heartbeat  Vascular: [ ] Pain in legs with walking, [ ] Pain in legs at rest, [ ] Pain in legs at night,  [ ] Non-healing ulcer, [ ] Blood clot in vein/DVT,   Pulmonary: [ ] Home oxygen, [ ] Productive cough, [ ] Coughing up blood, [ ] Asthma,  [ ] Wheezing [ ] COPD Musculoskeletal:  [ ] Arthritis, [ ] Low back pain, [ ] Joint pain Hematologic: [ ] Easy Bruising, [ ] Anemia; [ ] Hepatitis Gastrointestinal: [ ] Blood in stool, [ ] Gastroesophageal Reflux/heartburn, Urinary: [x ] chronic Kidney disease, [x ] on HD - [ x] MWF or [ ] TTHS, [ ] Burning with urination, [ ] Difficulty urinating Skin: [ ] Rashes, [ ] Wounds Psychological: [ ] Anxiety, [ ] Depression  Physical Examination Filed Vitals:   02/17/13 1030 02/17/13 1100 02/17/13 1105 02/17/13 1425  BP: 117/72 124/57 116/63 131/78  Pulse: 108 109 119 108  Temp:   98.2 F (36.8 C)   TempSrc:   Oral   Resp:   12   Height:      Weight:      SpO2:    100%   Body mass index is 26.67 kg/(m^2).  General:  WDWN in NAD HENT: WNL except trach in place Eyes: Pupils equal Pulmonary: normal non-labored breathing , without Rales, rhonchi,  wheezing Cardiac: RRR, without  Murmurs, rubs or gallops; Abdomen: soft, NT, no  masses Skin: no rashes, ulcers noted;  no Gangrene , no cellulitis; no open wounds;  Vascular Exam/Pulses: 2+ radial pulses bilat, right slightly less Musculoskeletal: no muscle wasting or atrophy; no edema  Neurologic: A&O   X 3; Appropriate Affect ;  SENSATION: normal; MOTOR FUNCTION: 5/5 Symmetric Speech is hoarse  Significant Diagnostic Studies: CBC Lab Results  Component Value Date   WBC 10.9* 02/17/2013   HGB 8.2* 02/17/2013   HCT 26.0* 02/17/2013   MCV 91.5 02/17/2013   PLT 209 02/17/2013   BMET    Component Value Date/Time   NA 143 02/17/2013 0708   K 3.8 02/17/2013 0708   CL 103 02/17/2013 0708   CO2 21 02/17/2013 0708   GLUCOSE 111* 02/17/2013 0708   BUN 77* 02/17/2013 0708   CREATININE 7.40* 02/17/2013 0708   CALCIUM 8.3* 02/17/2013 0708   GFRNONAA 7* 02/17/2013 0708   GFRAA 8* 02/17/2013 0708   Estimated Creatinine Clearance: 10 ml/min (by C-G formula based on Cr of 7.4).  COAG Lab Results  Component Value Date   INR 1.10 02/13/2013   INR 0.96 03/27/2011   Non-Invasive Vascular Imaging: Vein Mapping pending  Carotid Doppler at [facility] 07/08/12 - Right 60-79%  PSV/EDV  138/43 Left widely patent - S/P Left CEA by Dr. Lawson 04/01/2011  ASSESSMENT/PLAN: Victor Lane is a 67 y.o. male with Hx vocal cord CA with radiation, DM, HTN, Dysphagia who developed ESRD and is now on HD. Pt is in need of permanent access. Vein Mapping, remove IV from Left arm  numbness left small finger - Probable ulnar nerve compression As pt has a catheter, we can place fistula next week if he is still in the hospital or this can be done as an outpatient  ROCZNIAK,REGINA J 02/17/2013 2:30 PM  Agree with above.Vein map pending. We would not be able to do AVF until late next week. If he goes home before this, this can be arranged as an outpt.  Krystl Wickware, MD, FACS Beeper [phone] 02/17/2013  

## 2013-02-17 NOTE — ED Notes (Addendum)
Some left hand pain as when he was admitted

## 2013-02-17 NOTE — Progress Notes (Signed)
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 Jearld Fenton) ,undergoing RT [name]) adm to [facility] 8/22 with confusion, dyspnea, hypoxia, hyperkalemia & AKI with cr 11/ BUN 101 - transferred to [facility] for rt IJ permacath placement then , emergent HD. PCCM consulted 8/22 pm for worsening dyspnea while on HD requiring bipap.  SIGNIFICANT EVENTS: 01/04/13 microlaryngoscopy - Jearld Fenton 8/22 Start HD, PCCM consulted for dyspnea, SVT 8/24 Failed BiPAP >> VDRF, ENT consulted 8/26 Perc trach by DF >> #8 trach 8/27 Off vent, transfer to telemetry 8/29 G tube per IR  STUDIES:    LINES / TUBES: Rt IJ permcath (IR) 8/22 >> Lt IJ CVL 8/24 >> 8/28 ETT 8/24 >> 8/26 Trach (DF) 8/26 >>  CULTURES: BCx2 8/22 >> Trach asp 8/24>>oral flora  ANTIBIOTICS: Zithromax 8/22 >> 8/22 Aztreonam 8/22 >> 8/22 Ancef 8/22 >> 8/22 Rocephin 8/22 >> 8/22 Vancomycin 8/22 >> 8/27 Cefepime 8/23 >> 8/28  SUBJECTIVE:  Looks good. No new complaints. Well oriented. No distress  VITAL SIGNS: Temp:  [97.6 F (36.4 C)-98.4 F (36.9 C)] 98.2 F (36.8 C) (08/29 1105) Pulse Rate:  [86-119] 108 (08/29 1425) Resp:  [12-20] 14 (08/29 1340) BP: (112-176)/(57-92) 131/78 mmHg (08/29 1425) SpO2:  [92 %-100 %] 100 % (08/29 1425) FiO2 (%):  [28 %] 28 % (08/29 1425) Weight:  [82.555 kg (182 lb)-84.3 kg (185 lb 13.6 oz)] 84.3 kg (185 lb 13.6 oz) (08/29 0640)  INTAKE / OUTPUT: Intake/Output     08/28 0701 - 08/29 0700 08/29 0701 - 08/30 0700   I.V. (mL/kg)     Total Intake(mL/kg)     Urine (mL/kg/hr) 1100 (0.5)    Other  2182 (2.6)   Stool 201 (0.1)    Total Output 1301 2182   Net -1301 -2182      Stool Occurrence  1 x     PHYSICAL EXAMINATION: General: No distress Neuro: Alert, normal strength, follows commands HEENT: trach site clean Cardiovascular: regular, no murmur Lungs: scattered rhonchi Abdomen:  Soft, non tender Musculoskeletal:  No edema Skin:  No rash  LABS:  CBC Recent Labs     02/15/13  0500  02/16/13  0400  02/17/13  0708  WBC  9.3  9.4  10.9*  HGB  8.2*  8.4*  8.2*  HCT  25.5*  26.2*  26.0*  PLT  143*  166  209   Coag's No results found for this basename: APTT, INR,  in the last 72 hours  BMET Recent Labs     02/15/13  0500  02/16/13  0400  02/17/13  0708  NA  138  142  143  K  3.7  4.0  3.8  CL  99  104  103  CO2  25  24  21   BUN  74*  49*  77*  CREATININE  6.54*  5.29*  7.40*  GLUCOSE  129*  125*  111*   Electrolytes Recent Labs     02/15/13  0500  02/16/13  0400  02/17/13  0708  CALCIUM  7.8*  8.2*  8.3*  PHOS   --   5.1*  5.7*   ABG No results found  for this basename: PHART, PCO2ART, PO2ART,  in the last 72 hours Liver Enzymes Recent Labs     02/16/13  0400  02/17/13  0708  ALBUMIN  1.9*  1.9*   Glucose Recent Labs     02/16/13  0831  02/16/13  1120  02/16/13  1700  02/16/13  2345  02/17/13  1158  02/17/13  1617  GLUCAP  110*  107*  109*  102*  118*  90    CXR: NNF    ASSESSMENT / PLAN:  PULMONARY A: Acute respiratory failure 2nd to aspiration pneumonia  Laryngeal cancer Trach status P:   continue trach collar Cont BDs  CARDIOVASCULAR A: A fib with RVR >> NSR Hx of HTN. P:  DC dilt infusion Cont metoprolol  RENAL A:  ESRD on HD. Hyperkalemia >> resolved. P:   -HD per renal -d/c foley and place condom cath  GASTROINTESTINAL A: Dysphagia. Hx of GERD. Diarrhea >> improving. S/P G tube 8/29 P:   Begin TFs when OK per IR  HEMATOLOGIC A: Anemia of critical illness and chronic disease. Hx of laryngeal cancer s/p XRT. P:  -f/u CBC -transfuse for Hb < 7 -aranesp per renal -SQ  heparin for DVT prevention  INFECTIOUS A:  HCAP, aspiration P:   Micro and abx as above  ENDOCRINE A:  DM-2   P:   Holding glyburide Not requiring coverage Cont CBGs Resume SSI for glu > 180  NEUROLOGIC A:  Encephalopathy d/t uremia and hypoxemia,  resolved. Pain Deconditioning. P:   Cont prn fentanyl Cont PT/OT Discharge planning  TODAY'S SUMMARY:    Billy Fischer, MD ; [facility] service Mobile [phone].  After 5:30 PM or weekends, call [phone]

## 2013-02-17 NOTE — Progress Notes (Signed)
NUTRITION FOLLOW UP  Intervention:   1. Once PEG has been cleared for use, Initiate Nepro @ 20 ml/hr via PEG and increase by 10 ml every 4 hours to goal rate of 45 ml/hr. 30 ml Prostat BID.  At goal rate, tube feeding regimen will provide 2144 kcal, 117 grams of protein, and 785 ml of H2O.    2. This will not meet hydration needs, will need additional free water to meet hydration needs.  3. Family desires nocturnal feeding to allow for time free from feeding pump. Once pt is able to tolerate TF can transition to the following: Nepro @ 90 ml/hr over 12 hours and 30 ml Pro-stat BID. This will provide the same calories and protein as the 24 hour regimen.   4. Basic renal diet education was provided to pt/wife. Hand outs given with RD contact information.   Nutrition Dx:   Inadequate oral intake related to inability to eat as evidenced by NPO status; ongoing.   Goal:   Pt to meet >/=90% estimated nutrition needs. Not met.   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  Per nephrology pt will be HD dependent from now on.  Pt seen by SLP and has been made NPO after FEES. Attempted NGT placement x3 and was unsuccessful and PEG was decided on. Placed today by radiology. RD consulted for initiation and management of enteral nutrition once G tube cleared for use.   RD spoke with pt and family regarding tube feeding. Discussed options for being off the pump to allow for work with therapy and HD. Pt motivated to work with SLP to regain strength with swallowing and transition to oral intake. Wife expressed concerns about renal diet. RD provided basic renal diet education and provided "Choose a Meal" booklet and "Renal Food Guide  Pyramid" to reference. Encouraged them to talk with HD RD if further questions arise. Discussed with RN-- RD will order enteral nutrition regimen per protocol with instruction not to begin until tube has been cleared for use, likely tomorrow. RN in agreement.    Height: Ht Readings from Last 1 Encounters:  02/12/13 5\' 10"  (1.778 m)    Weight Status:   Wt Readings from Last 1 Encounters:  02/17/13 185 lb 13.6 oz (84.3 kg)  admission weight 194 lbs. Weight loss related to fluid status?   Re-estimated needs:  Kcal: 2100-2300  Protein: 105-115 grams  Fluid: >1.2 L/day  Skin: intact   Diet Order: NPO   Intake/Output Summary (Last 24 hours) at 02/17/13 1446 Last data filed at 02/17/13 1105  Gross per 24 hour  Intake      0 ml  Output   3283 ml  Net  -3283 ml  Net -1.9 L this admission   Last BM: 8/29   Labs:   Recent Labs Lab 02/10/13 1800  02/11/13 0520 02/12/13 0500  02/15/13 0500 02/16/13 0400 02/17/13 0708  NA  --   < > 142 141  < > 138 142 143  K  --   < > 5.3* 4.7  < > 3.7 4.0 3.8  CL  --   < > 106 102  < > 99 104 103  CO2  --   < >  20 23  < > 25 24 21   BUN  --   < > 88* 68*  < > 74* 49* 77*  CREATININE  --   < > 9.27* 7.26*  < > 6.54* 5.29* 7.40*  CALCIUM  --   < > 8.3* 8.5  < > 7.8* 8.2* 8.3*  MG  --   --   --  2.1  --   --   --   --   PHOS 8.4*  --   --   --   --   --  5.1* 5.7*  GLUCOSE  --   < > 126* 149*  < > 129* 125* 111*  < > = values in this interval not displayed.  CBG (last 3)   Recent Labs  02/16/13 1700 02/16/13 2345 02/17/13 1158  GLUCAP 109* 102* 118*    Scheduled Meds: . antiseptic oral rinse  15 mL Mouth Rinse QID  . chlorhexidine  15 mL Mouth Rinse BID  . darbepoetin (ARANESP) injection - DIALYSIS  100 mcg Intravenous Q Sat-HD  . heparin  5,000 Units Subcutaneous Q8H  . metoprolol  5 mg Intravenous Q6H  . sodium chloride  3 mL Intravenous Q12H    Continuous Infusions:  none   Clarene Duke RD, LDN Pager  [phone] After Hours pager [phone]

## 2013-02-17 NOTE — Procedures (Signed)
I was present at this dialysis session. I have reviewed the session itself and made appropriate changes.   Vinson Moselle  MD Pager [phone]    Cell  [phone] 02/17/2013, 11:09 AM

## 2013-02-17 NOTE — Progress Notes (Signed)
Orthopedic Tech Progress Note Patient Details:  Victor Lane Nov 02, 1945 [id number]  Ortho Devices Type of Ortho Device: Abdominal binder Ortho Device/Splint Interventions: [name] 02/17/2013, 6:27 PM

## 2013-02-17 NOTE — Progress Notes (Signed)
Patient transported to 2w10. Report/Care released to [employer], Charity fundraiser.Mamie Levers

## 2013-02-18 DIAGNOSIS — R1310 Dysphagia, unspecified: Secondary | ICD-10-CM

## 2013-02-18 DIAGNOSIS — E43 Unspecified severe protein-calorie malnutrition: Secondary | ICD-10-CM

## 2013-02-18 DIAGNOSIS — J690 Pneumonitis due to inhalation of food and vomit: Principal | ICD-10-CM

## 2013-02-18 LAB — GLUCOSE, CAPILLARY: Glucose-Capillary: 101 mg/dL — ABNORMAL HIGH (ref 70–99)

## 2013-02-18 MED ORDER — RENA-VITE PO TABS
1.0000 | ORAL_TABLET | Freq: Every day | ORAL | Status: DC
  Administered 2013-02-18 – 2013-02-24 (×6): 1 via ORAL
  Filled 2013-02-18 (×8): qty 1

## 2013-02-18 MED ORDER — FREE WATER
100.0000 mL | Freq: Three times a day (TID) | Status: DC
  Administered 2013-02-18 – 2013-02-24 (×18): 100 mL

## 2013-02-18 MED ORDER — PRO-STAT SUGAR FREE PO LIQD
30.0000 mL | Freq: Two times a day (BID) | ORAL | Status: DC
  Administered 2013-02-18 – 2013-02-22 (×9): 30 mL
  Filled 2013-02-18 (×12): qty 30

## 2013-02-18 MED ORDER — DARBEPOETIN ALFA-POLYSORBATE 100 MCG/0.5ML IJ SOLN
100.0000 ug | INTRAMUSCULAR | Status: DC
  Administered 2013-02-20: 100 ug via INTRAVENOUS
  Filled 2013-02-18: qty 0.5

## 2013-02-18 MED ORDER — CALCIUM ACETATE 667 MG PO CAPS
667.0000 mg | ORAL_CAPSULE | Freq: Three times a day (TID) | ORAL | Status: DC
  Administered 2013-02-18 – 2013-02-25 (×14): 667 mg via ORAL
  Filled 2013-02-18 (×24): qty 1

## 2013-02-18 NOTE — Progress Notes (Signed)
Subjective:  Sitting at bedside with wife / tolerated hd yesterday/ " hungry ready for Tube feeds to start" Objective Vital signs in last 24 hours: Filed Vitals:   02/17/13 2353 02/18/13 0428 02/18/13 0509 02/18/13 0931  BP:   164/76   Pulse: 86 82 90 100  Temp:   98.9 F (37.2 C)   TempSrc:   Oral   Resp: 14 15 16 16   Height:      Weight:   79.8 kg (175 lb 14.8 oz)   SpO2: 95% 100% 100% 99%   Weight change: -2.755 kg (-6 lb 1.2 oz)  Intake/Output Summary (Last 24 hours) at 02/18/13 1114 Last data filed at 02/18/13 0600  Gross per 24 hour  Intake    103 ml  Output    400 ml  Net   -297 ml   Labs: Basic Metabolic Panel:  Recent Labs Lab 02/15/13 0500 02/16/13 0400 02/17/13 0708  NA 138 142 143  K 3.7 4.0 3.8  CL 99 104 103  CO2 25 24 21   GLUCOSE 129* 125* 111*  BUN 74* 49* 77*  CREATININE 6.54* 5.29* 7.40*  CALCIUM 7.8* 8.2* 8.3*  PHOS  --  5.1* 5.7*   Liver Function Tests:  Recent Labs Lab 02/13/13 0420 02/16/13 0400 02/17/13 0708  AST 58*  --   --   ALT 10  --   --   ALKPHOS 60  --   --   BILITOT 0.2*  --   --   PROT 5.0*  --   --   ALBUMIN 1.8* 1.9* 1.9*  CBC:  Recent Labs Lab 02/13/13 0420 02/14/13 0500 02/15/13 0500 02/16/13 0400 02/17/13 0708  WBC 7.6 6.4 9.3 9.4 10.9*  NEUTROABS 6.0  --   --   --   --   HGB 8.0* 7.7* 8.2* 8.4* 8.2*  HCT 24.9* 23.1* 25.5* 26.2* 26.0*  MCV 90.5 91.3 91.4 91.9 91.5  PLT 150 137* 143* 166 209  CBG:  Recent Labs Lab 02/16/13 1700 02/16/13 2345 02/17/13 1158 02/17/13 1617 02/18/13 0017  GLUCAP 109* 102* 118* 90 101*   Medications: . feeding supplement (NEPRO CARB STEADY)     . antiseptic oral rinse  15 mL Mouth Rinse QID  . chlorhexidine  15 mL Mouth Rinse BID  . darbepoetin (ARANESP) injection - DIALYSIS  100 mcg Intravenous Q Sat-HD  . heparin  5,000 Units Subcutaneous Q8H  . metoprolol  5 mg Intravenous Q6H  . sodium chloride  3 mL Intravenous Q12H    Physical Exam: General: Alert, NAD  WM Heart: RRR , no murmur or rub Lungs: faint wheeze LLL, otherwise CTA Abdomen: BS pos. Soft Peg tube in place, nontender Extremities: Dialysis Access: No pedal edema / R IJ permcath  Assessment/Plan:  1. ESRD - new HD start 8/22. HD yesterday and MWF in hosp. K 3.8 yesterday and stable/  Verifying  With Pulmononary Service, Dr . Maple Hudson today  Will need current  Trach.setting and would need LTAC  Setting for HD/ no outpt. HD until Trach could be plugged. 2. 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 PEG tube placed and Radiology okayed Feeding start 3. Anemia - hgb 8.2, aranesp  Given last Saturday , continue q Monday hd  / TFS 11% with Ferritin > 1,000/ no iron currently 4. Secondary hyperparathyroidism - Ca+ 8.3. Phose 5.7/ start Phoslo  With feedings 5. HTN/volume -2182 cc uf yesterday  With hd and tolerated 6. Nutrition - Tube feedings and start  renal vitamin 7. Afib- metoprolol IV / SR with exam 8. Aspiration [place] vs Pulmonary Edema= CX's neg/ sp Abx.  Lenny Pastel, PA-C [facility] Beeper [phone] 02/18/2013,11:14 AM  LOS: 8 days   Patient seen and examined.  Agree with assessment and plan as above. Vinson Moselle  MD Pager [phone]    Cell  [phone] 02/18/2013, 3:59 PM

## 2013-02-18 NOTE — Progress Notes (Signed)
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 Jearld Fenton) ,undergoing RT [name]) adm to [facility] 8/22 with confusion, dyspnea, hypoxia, hyperkalemia & AKI with cr 11/ BUN 101 - transferred to [facility] for rt IJ permacath placement then , emergent HD. PCCM consulted 8/22 pm for worsening dyspnea while on HD requiring bipap.  SIGNIFICANT EVENTS: 01/04/13 microlaryngoscopy - Jearld Fenton 8/22 Start HD, PCCM consulted for dyspnea, SVT 8/24 Failed BiPAP >> VDRF, ENT consulted 8/26 Perc trach by DF >> #8 trach 8/27 Off vent, transfer to telemetry 8/29 G tube per IR  STUDIES:    LINES / TUBES: Rt IJ permcath (IR) 8/22 >> Lt IJ CVL 8/24 >> 8/28 ETT 8/24 >> 8/26 Trach (DF) 8/26 >>  CULTURES: BCx2 8/22 >> Trach asp 8/24>>oral flora  ANTIBIOTICS: Zithromax 8/22 >> 8/22 Aztreonam 8/22 >> 8/22 Ancef 8/22 >> 8/22 Rocephin 8/22 >> 8/22 Vancomycin 8/22 >> 8/27 Cefepime 8/23 >> 8/28  SUBJECTIVE:  "Famished" Wife in room  VITAL SIGNS: Temp:  [98 F (36.7 C)-98.9 F (37.2 C)] 98.9 F (37.2 C) (08/30 0509) Pulse Rate:  [82-108] 100 (08/30 0931) Resp:  [14-17] 16 (08/30 0931) BP: (112-164)/(66-79) 164/76 mmHg (08/30 0509) SpO2:  [95 %-100 %] 99 % (08/30 0931) FiO2 (%):  [28 %-35 %] 28 % (08/30 0931) Weight:  [79.8 kg (175 lb 14.8 oz)] 79.8 kg (175 lb 14.8 oz) (08/30 0509)  INTAKE / OUTPUT: Intake/Output     08/29 0701 - 08/30 0700 08/30 0701 - 08/31 0700   P.O. 100    I.V. (mL/kg) 3 (0)    Total Intake(mL/kg) 103 (1.3)    Urine (mL/kg/hr)     Drains 400 (0.2)    Other 2182 (1.1)    Stool     Total Output 2582     Net -2479          Stool Occurrence 1 x       PHYSICAL EXAMINATION: General: No distress Neuro: Alert, normal strength, follows commands HEENT: trach site clean Cardiovascular: regular, no murmur Lungs: few crackles Abdomen:  New G tube Musculoskeletal:  No edema Skin:  No rash  LABS:  CBC Recent Labs     02/16/13  0400  02/17/13  0708  WBC  9.4  10.9*  HGB  8.4*  8.2*  HCT  26.2*  26.0*  PLT  166  209   Coag's No results found for this basename: APTT, INR,  in the last 72 hours  BMET Recent Labs     02/16/13  0400  02/17/13  0708  NA  142  143  K  4.0  3.8  CL  104  103  CO2  24  21  BUN  49*  77*  CREATININE  5.29*  7.40*  GLUCOSE  125*  111*   Electrolytes Recent Labs     02/16/13  0400  02/17/13  0708  CALCIUM  8.2*  8.3*  PHOS  5.1*  5.7*   ABG No results found for this basename: PHART, PCO2ART, PO2ART,  in the last 72 hours Liver Enzymes Recent Labs     02/16/13  0400  02/17/13  0708  ALBUMIN  1.9*  1.9*   Glucose Recent Labs     02/16/13  1120  02/16/13  1700  02/16/13  2345  02/17/13  1158  02/17/13  1617  02/18/13  0017  GLUCAP  107*  109*  102*  118*  90  101*    CXR: NNF    ASSESSMENT / PLAN:  PULMONARY A: Acute respiratory failure 2nd to aspiration pneumonia  Laryngeal cancer Trach status P:   continue trach collar Cont BDs  CARDIOVASCULAR A: A fib with RVR >> NSR Hx of HTN. P:  DC dilt infusion Cont metoprolol  RENAL A:  ESRD on HD. Hyperkalemia >> resolved. P:   -HD per renal -d/c foley and place condom cath  GASTROINTESTINAL A: Dysphagia. Hx of GERD. Diarrhea >> improving. S/P G tube 8/29. OK per IR to begin TF P:   Nutrition consult to initiate tube feed.   HEMATOLOGIC A: Anemia of critical illness and chronic disease. Hx of laryngeal cancer s/p XRT. P:  -f/u CBC               8/30 = 8.2 Hg -transfuse for Hb < 7 -aranesp per renal -SQ heparin for DVT prevention  INFECTIOUS A:  HCAP, aspiration P:   Micro and abx as  above  ENDOCRINE A:  DM-2   P:   Holding glyburide Not requiring coverage- Will change as feeding begins Cont CBGs Resume SSI for glu > 180  NEUROLOGIC A:  Encephalopathy d/t uremia and hypoxemia,  resolved. Pain Deconditioning. P:   Cont prn fentanyl Cont PT/OT Discharge planning  TODAY'S SUMMARY: Nutrition consulted for tube feed. Wil impact renal function/ HD and glucose   CD Young, MD PCCM m [phone] After 5:30 PM or weekends, call [phone]

## 2013-02-18 NOTE — Progress Notes (Signed)
Subjective: Pt without new c/o; some mild soreness at G tube site  Objective: Vital signs in last 24 hours: Temp:  [98 F (36.7 C)-98.9 F (37.2 C)] 98.9 F (37.2 C) (08/30 0509) Pulse Rate:  [82-119] 100 (08/30 0931) Resp:  [12-17] 16 (08/30 0931) BP: (112-164)/(63-79) 164/76 mmHg (08/30 0509) SpO2:  [95 %-100 %] 99 % (08/30 0931) FiO2 (%):  [28 %-35 %] 28 % (08/30 0931) Weight:  [175 lb 14.8 oz (79.8 kg)] 175 lb 14.8 oz (79.8 kg) (08/30 0509) Last BM Date: 02/17/13  Intake/Output from previous day: 08/29 0701 - 08/30 0700 In: 103 [P.O.:100; I.V.:3] Out: 2582 [Drains:400] Intake/Output this shift:     gastrostomy tube intact, insertion site ok, mildly tender; abd- soft,ND  Lab Results:   Recent Labs  02/16/13 0400 02/17/13 0708  WBC 9.4 10.9*  HGB 8.4* 8.2*  HCT 26.2* 26.0*  PLT 166 209   BMET  Recent Labs  02/16/13 0400 02/17/13 0708  NA 142 143  K 4.0 3.8  CL 104 103  CO2 24 21  GLUCOSE 125* 111*  BUN 49* 77*  CREATININE 5.29* 7.40*  CALCIUM 8.2* 8.3*   PT/INR No results found for this basename: LABPROT, INR,  in the last 72 hours ABG No results found for this basename: PHART, PCO2, PO2, HCO3,  in the last 72 hours  Studies/Results: Ir Gastrostomy Tube Mod Sed  02/17/2013   *RADIOLOGY REPORT*  Clinical Data:  Dysphagia, laryngeal cancer  FLUOROSCOPIC 20-FRENCH PULL-THROUGH GASTROSTOMY  Date:  02/17/2013 12:10:00  Radiologist:  M. Ruel Favors, M.D.  Medications:  2 grams ancefadministered within 1 hour of the procedure,1 mg Versed, 75 mcg Fentanyl  Guidance:  Fluoroscopic  Fluoroscopy time:  4 minutes 30 seconds  Sedation time:  15 minutes  Contrast volume:  20 ml Omnipaque-300  Complications:  No immediate  PROCEDURE/FINDINGS:  Informed consent was obtained from the patient following explanation of the procedure, risks, benefits and alternatives. The patient understands, agrees and consents for the procedure. All questions were addressed.  A time out  was performed.  Maximal barrier sterile technique utilized including caps, mask, sterile gowns, sterile gloves, large sterile drape, hand hygiene, and betadine prep.  The left upper quadrant was sterilely prepped and draped.  An oral gastric catheter was inserted into the stomach under fluoroscopy. The existing nasogastric feeding tube was removed.  Air was injected into the stomach for insufflation and visualization under fluoroscopy.  The air distended stomach was confirmed beneath the anterior abdominal wall in the frontal and lateral projections. Under sterile conditions and local anesthesia, a 17 gauge trocar needle was utilized to access the stomach percutaneously beneath the left subcostal margin.  Needle position was confirmed within the stomach under biplane fluoroscopy.  Contrast injection confirmed position also.  A single T tack was deployed for gastropexy.  Over an Amplatz guide wire, a 9-French sheath was inserted into the stomach.  A snare device was utilized to capture the oral gastric catheter.  The snare device was pulled retrograde from the stomach up the esophagus and out the oropharynx.  The 20- French pull-through gastrostomy was connected to the snare device and pulled antegrade through the oropharynx down the esophagus into the stomach and then through the percutaneous tract external to the patient.  The gastrostomy was assembled externally.  Contrast injection confirms position in the stomach.  Images were obtained for documentation.  The patient tolerated procedure well.  No immediate complication.  IMPRESSION:  Fluoroscopic insertion of a 20-French "  pull-through" gastrostomy.   Original Report Authenticated By: Judie Petit. Miles Costain, M.D.    Anti-infectives: Anti-infectives   Start     Dose/Rate Route Frequency Ordered Stop   02/17/13 0800  ceFAZolin (ANCEF) IVPB 2 g/50 mL premix     2 g 100 mL/hr over 30 Minutes Intravenous  Once 02/16/13 1414 02/17/13 1323   02/15/13 1200  ceFEPIme  (MAXIPIME) 2 g in dextrose 5 % 50 mL IVPB  Status:  Discontinued     2 g 100 mL/hr over 30 Minutes Intravenous Every M-W-F (Hemodialysis) 02/15/13 0844 02/16/13 1349   02/15/13 0830  vancomycin (VANCOCIN) IVPB 1000 mg/200 mL premix  Status:  Discontinued     1,000 mg 200 mL/hr over 60 Minutes Intravenous  Once 02/15/13 0831 02/15/13 1044   02/13/13 1745  vancomycin (VANCOCIN) IVPB 1000 mg/200 mL premix  Status:  Discontinued     1,000 mg 200 mL/hr over 60 Minutes Intravenous  Once 02/13/13 1740 02/15/13 0829   02/11/13 2200  aztreonam (AZACTAM) 1 g in dextrose 5 % 50 mL IVPB  Status:  Discontinued     1 g 100 mL/hr over 30 Minutes Intravenous Every 24 hours 02/11/13 1201 02/11/13 1205   02/11/13 2200  ceFEPIme (MAXIPIME) 1 g in dextrose 5 % 50 mL IVPB  Status:  Discontinued     1 g 100 mL/hr over 30 Minutes Intravenous Every 24 hours 02/11/13 1205 02/15/13 0844   02/11/13 1600  vancomycin (VANCOCIN) 500 mg in sodium chloride 0.9 % 100 mL IVPB     500 mg 100 mL/hr over 60 Minutes Intravenous  Once 02/11/13 1431 02/11/13 1802   02/10/13 2200  aztreonam (AZACTAM) 2 g in dextrose 5 % 50 mL IVPB  Status:  Discontinued     2 g 100 mL/hr over 30 Minutes Intravenous 3 times per day 02/10/13 1853 02/10/13 1928   02/10/13 2000  aztreonam (AZACTAM) 2 g in dextrose 5 % 50 mL IVPB  Status:  Discontinued     2 g 100 mL/hr over 30 Minutes Intravenous Every 24 hours 02/10/13 1928 02/11/13 1201   02/10/13 1945  vancomycin (VANCOCIN) 1,500 mg in sodium chloride 0.9 % 500 mL IVPB     1,500 mg 250 mL/hr over 120 Minutes Intravenous  Once 02/10/13 1937 02/10/13 2206   02/10/13 1900  ceFAZolin (ANCEF) IVPB 2 g/50 mL premix     2 g 100 mL/hr over 30 Minutes Intravenous On call 02/10/13 1853 02/10/13 2012   02/10/13 1500  vancomycin (VANCOCIN) IVPB 1000 mg/200 mL premix  Status:  Discontinued     1,000 mg 200 mL/hr over 60 Minutes Intravenous On call 02/10/13 1458 02/10/13 1501   02/10/13 1100  cefTRIAXone  (ROCEPHIN) 1 g in dextrose 5 % 50 mL IVPB     1 g 100 mL/hr over 30 Minutes Intravenous  Once 02/10/13 1057 02/10/13 1159   02/10/13 1100  azithromycin (ZITHROMAX) 500 mg in dextrose 5 % 250 mL IVPB     500 mg 250 mL/hr over 60 Minutes Intravenous  Once 02/10/13 1057 02/10/13 1348      Assessment/Plan: s/p perc gastrostomy tube placement 8/29; ok to use tube; other plans as per primary  LOS: 8 days    Kayzen Kendzierski,D [facility] 02/18/2013

## 2013-02-18 NOTE — Progress Notes (Signed)
Speech Language Pathology Dysphagia Treatment Patient Details Name: Victor Lane MRN: [id number] DOB: [birth year] Today's Date: 02/18/2013 Time: 1100-1156 SLP Time Calculation (min): 56 min  Assessment / Plan / Recommendation Clinical Impression  Reviewed information with wife (who was not present yesterday). Noted mouth swab in water at bedside. Education provided re: buildup of bacteria with that practice, encouraged pt to use and pitch swabs.  Reviewed protocol for eating ice chips, and reiterated importance of oral care.  Encouraged completion of oropharyngeal exercises now, not waiting until he is home.      Diet Recommendation  Continue with Current Diet: NPO    SLP Plan Continue with current plan of care   Pertinent Vitals/Pain No pain reported   Swallowing Goals  SLP Swallowing Goals Goal #3: Pt will exhibit independence with HEP. Swallow Study Goal #3 - Progress: Progressing toward goal  General Temperature Spikes Noted: No Respiratory Status: Trach (PMV in place) Behavior/Cognition: Alert;Cooperative;Requires cueing;Decreased sustained attention;Distractible Oral Cavity - Dentition: Adequate natural dentition Patient Positioning: Upright in bed  Oral Cavity - Oral Hygiene Does patient have any of the following "at risk" factors?: Oxygen therapy - cannula, mask, simple oxygen devices;Other - dysphagia;Nutritional status - inadequate (trach collar) Brush patient's teeth BID with toothbrush (using toothpaste with fluoride): Yes Patient is HIGH RISK - Oral Care Protocol followed (see row info): Yes Patient is AT RISK - Oral Care Protocol followed (see row info): Yes   Dysphagia Treatment Treatment focused on: Patient/family/caregiver education Family/Caregiver Educated: wife Patient observed directly with PO's: No   GO    Celia B. Murvin Natal [facility], CCC-SLP [phone]  Leigh Aurora 02/18/2013, 12:00 PM

## 2013-02-18 NOTE — Progress Notes (Signed)
NUTRITION FOLLOW UP/CONSULT  Intervention:   1. Initiate Nepro @ 20 ml/hr via PEG and increase by 10 ml every 4 hours to goal rate of 45 ml/hr. 30 ml Prostat BID.  At goal rate, tube feeding regimen will provide 2144 kcal, 117 grams of protein, and 785 ml of H2O.    2. Will add free water flushes of 100 ml TID. Renal MD: adjust free water as needed.  3. Family desires nocturnal feeding to allow for time free from feeding pump. Once pt is able to tolerate TF can transition to the following: Nepro @ 90 ml/hr over 12 hours and 30 ml Pro-stat BID. This will provide the same calories and protein as the 24 hour regimen.   4. Basic renal diet education was provided to pt/wife. Hand outs given with RD contact information.   Nutrition Dx:   Inadequate oral intake related to inability to eat as evidenced by NPO status; ongoing.   Goal:   Pt to meet >/=90% estimated nutrition needs. Not met.   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 Per nephrology pt will be HD dependent from now on.  Pt seen by SLP and has been made NPO after FEES. Attempted NGT placement x3 and was unsuccessful and PEG was decided on. Placed today by radiology. RD consulted for initiation and management of enteral nutrition once G tube cleared for use.   RD spoke with pt and family regarding tube feeding 8/29. Discussed options for being off the pump to allow for work with therapy and HD. Pt motivated to work with SLP to regain strength with swallowing and transition to oral intake. Wife expressed concerns about renal diet. RD provided basic renal diet education and provided "Choose a Meal" booklet and "Renal Food Guide Pyramid" to reference. Encouraged  them to talk with HD RD if further questions arise.  RD discussed with RN initiation of nutrition support. TF orders in EPIC, has yet to be started. Discussed orders with RN, RN verbalized understanding.   Height: Ht Readings from Last 1 Encounters:  02/12/13 5\' 10"  (1.778 m)    Weight Status:   Wt Readings from Last 1 Encounters:  02/18/13 175 lb 14.8 oz (79.8 kg)  admission weight 194 lbs. Weight loss related to fluid status?   Re-estimated needs:  Kcal: 2100-2300  Protein: 105-115 grams  Fluid: >1.2 L/day  Skin: intact   Diet Order: NPO   Intake/Output Summary (Last 24 hours) at 02/18/13 1229 Last data filed at 02/18/13 0600  Gross per 24 hour  Intake    103 ml  Output    400 ml  Net   -297 ml  Net -2.2 L this admission   Last BM: 8/29   Labs:   Recent Labs Lab 02/12/13 0500  02/15/13 0500 02/16/13 0400 02/17/13 0708  NA 141  < > 138 142 143  K 4.7  < > 3.7 4.0 3.8  CL 102  < > 99 104 103  CO2 23  < > 25 24 21   BUN 68*  < > 74* 49* 77*  CREATININE 7.26*  < > 6.54* 5.29* 7.40*  CALCIUM 8.5  < > 7.8* 8.2* 8.3*  MG 2.1  --   --   --   --  PHOS  --   --   --  5.1* 5.7*  GLUCOSE 149*  < > 129* 125* 111*  < > = values in this interval not displayed.  CBG (last 3)   Recent Labs  02/17/13 1158 02/17/13 1617 02/18/13 0017  GLUCAP 118* 90 101*    Scheduled Meds: . antiseptic oral rinse  15 mL Mouth Rinse QID  . calcium acetate  667 mg Oral TID WC  . chlorhexidine  15 mL Mouth Rinse BID  . [START ON 02/20/2013] darbepoetin (ARANESP) injection - DIALYSIS  100 mcg Intravenous Q Mon-HD  . heparin  5,000 Units Subcutaneous Q8H  . metoprolol  5 mg Intravenous Q6H  . multivitamin  1 tablet Oral QHS  . sodium chloride  3 mL Intravenous Q12H    Continuous Infusions: . feeding supplement (NEPRO CARB STEADY)     Jarold Motto MS, RD, LDN Pager: [phone] After-hours pager: [phone]

## 2013-02-19 DIAGNOSIS — Z01810 Encounter for preprocedural cardiovascular examination: Secondary | ICD-10-CM

## 2013-02-19 LAB — GLUCOSE, CAPILLARY
Glucose-Capillary: 121 mg/dL — ABNORMAL HIGH (ref 70–99)
Glucose-Capillary: 128 mg/dL — ABNORMAL HIGH (ref 70–99)
Glucose-Capillary: 111 mg/dL — ABNORMAL HIGH (ref 70–99)
Glucose-Capillary: 107 mg/dL — ABNORMAL HIGH (ref 70–99)

## 2013-02-19 MED ORDER — HYDROCORTISONE ACETATE 25 MG RE SUPP
25.0000 mg | Freq: Two times a day (BID) | RECTAL | Status: DC | PRN
  Filled 2013-02-19: qty 1

## 2013-02-19 MED ORDER — ZOLPIDEM TARTRATE 5 MG PO TABS
5.0000 mg | ORAL_TABLET | Freq: Every evening | ORAL | Status: DC | PRN
  Administered 2013-02-19 – 2013-02-23 (×4): 5 mg
  Filled 2013-02-19 (×4): qty 1

## 2013-02-19 MED ORDER — SODIUM CHLORIDE 0.9 % IV SOLN
125.0000 mg | INTRAVENOUS | Status: DC
  Administered 2013-02-20 – 2013-02-24 (×3): 125 mg via INTRAVENOUS
  Filled 2013-02-19 (×6): qty 10

## 2013-02-19 NOTE — Progress Notes (Signed)
VASCULAR LAB PRELIMINARY  PRELIMINARY  PRELIMINARY  PRELIMINARY  Left Upper Extremity Vein Map    Cephalic  Segment Diameter Comment  1. Axilla 3.3 mm   2. Mid upper arm 7.2 mm   3. Above AC 3.9 mm   4. In AC 4.7 mm   5. Below AC 3.3 mm Multiple branches  6. Mid forearm 2.7 mm Branch  7. Wrist 2.6 mm    Basilic  Segment Diameter Depth Comment  2. Mid upper arm 4 mm 21.8 mm Enters Brachial  3. Above AC 3.3 mm 7.6 mm   4. In AC 5 mm 6 mm   5. Below AC 2.9 mm 2.4 mm   6. Mid forearm 1.6 mm 3.2 mm Multiple branches  7. Wrist 1.7 mm 3.6 mm     Kaitlyn Franko, RVS 02/19/2013, 2:48 PM  Sherren Kerns, RVS 02/19/2013, 2:48 PM

## 2013-02-19 NOTE — Progress Notes (Signed)
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 Jearld Fenton) ,undergoing RT [name]) adm to [facility] 8/22 with confusion, dyspnea, hypoxia, hyperkalemia & AKI with cr 11/ BUN 101 - transferred to [facility] for rt IJ permacath placement then , emergent HD. PCCM consulted 8/22 pm for worsening dyspnea while on HD requiring bipap.  SIGNIFICANT EVENTS: 01/04/13 microlaryngoscopy - Jearld Fenton 8/22 Start HD, PCCM consulted for dyspnea, SVT 8/24 Failed BiPAP >> VDRF, ENT consulted 8/26 Perc trach by DF >> #8 trach 8/27 Off vent, transfer to telemetry 8/29 G tube per IR  STUDIES:    LINES / TUBES: Rt IJ permcath (IR) 8/22 >> Lt IJ CVL 8/24 >> 8/28 ETT 8/24 >> 8/26 Trach (DF) 8/26 >>  CULTURES: BCx2 8/22 >> Trach asp 8/24>>oral flora  ANTIBIOTICS: Zithromax 8/22 >> 8/22 Aztreonam 8/22 >> 8/22 Ancef 8/22 >> 8/22 Rocephin 8/22 >> 8/22 Vancomycin 8/22 >> 8/27 Cefepime 8/23 >> 8/28  SUBJECTIVE:  Started tube feed. Asking ice chips. Wants to mobilize. Sore after rectal tube. Nurse reports request sleep aid.  VITAL SIGNS: Temp:  [97.7 F (36.5 C)-98.6 F (37 C)] 97.8 F (36.6 C) (08/31 0415) Pulse Rate:  [83-104] 104 (08/31 0415) Resp:  [16-18] 16 (08/31 0415) BP: (132-136)/(72-79) 132/79 mmHg (08/31 0415) SpO2:  [96 %-100 %] 100 % (08/31 0454) FiO2 (%):  [28 %] 28 % (08/31 0454)  INTAKE / OUTPUT: Intake/Output     08/30 0701 - 08/31 0700 08/31 0701 - 09/01 0700   P.O.     I.V. (mL/kg) 3 (0)    NG/GT 78    Total Intake(mL/kg) 81 (1)    Urine (mL/kg/hr) 200 (0.1)    Drains     Other     Total Output 200     Net -119            PHYSICAL  EXAMINATION: General: No distress Neuro: Alert, normal strength, follows commands HEENT: trach site clean. Speaking clearly Cardiovascular: regular, no murmur Lungs: few crackles Abdomen:  New G tube Musculoskeletal:  No edema Skin:  No rash  LABS:  CBC Recent Labs     02/17/13  0708  WBC  10.9*  HGB  8.2*  HCT  26.0*  PLT  209   Coag's No results found for this basename: APTT, INR,  in the last 72 hours  BMET Recent Labs     02/17/13  0708  NA  143  K  3.8  CL  103  CO2  21  BUN  77*  CREATININE  7.40*  GLUCOSE  111*   Electrolytes Recent Labs     02/17/13  0708  CALCIUM  8.3*  PHOS  5.7*   ABG No results found for this basename: PHART, PCO2ART, PO2ART,  in the last 72 hours Liver Enzymes Recent Labs     02/17/13  0708  ALBUMIN  1.9*   Glucose Recent Labs     02/17/13  1617  02/18/13  0017  02/18/13  2054  02/19/13  0034  02/19/13  0400  02/19/13  1130  GLUCAP  90  101*  126*  128*  121*  128*    CXR: NNF  ASSESSMENT / PLAN:  PULMONARY A: Acute respiratory failure 2nd to aspiration pneumonia  Laryngeal cancer Trach status P:   continue trach collar Cont BDs Mobilize as tolerated. Note PT/OT consult 8/28  CARDIOVASCULAR A: A fib with RVR >> NSR Hx of HTN. P:  DC dilt infusion Cont metoprolol  RENAL A:  ESRD on HD. Hyperkalemia >> resolved. P:   -HD per renal. chronic - condom cath  GASTROINTESTINAL A: Dysphagia. Hx of GERD. Diarrhea >> improving. S/P G tube 8/29.   P:    Advance tube feed as tolerated  HEMATOLOGIC A: Anemia of critical illness and chronic disease. Hx of laryngeal cancer s/p XRT. P:  -f/u CBC               8/30 = 8.2 Hg -transfuse for Hb < 7 -aranesp per renal -SQ heparin for DVT prevention - for CBC 9/1  INFECTIOUS A:  HCAP, aspiration P:   Micro and abx as above  ENDOCRINE A:  DM-2   P:   Holding glyburide Not requiring coverage- Will change as feeding begins Cont CBGs Resume  SSI for glu > 180  NEUROLOGIC A:  Encephalopathy d/t uremia and hypoxemia,  resolved. Pain Deconditioning. P:   Cont prn fentanyl Cont PT/OT Discharge planning  TODAY'S SUMMARY: Advancing tube feed.  Added ambien and Anusol per request Advance mobility as tolerated.  Nephrology plans chronic HD Need to Plan disposition/ resumption of cancer Rx-? Back to [name], home?   CD Maple Hudson, MD PCCM m [phone] After 5:30 PM or weekends, call [phone]

## 2013-02-19 NOTE — Progress Notes (Signed)
Vascular Surgery Vein map still pending.  Consider AVF late next week or arrange in the future as an outpt. Cari Caraway

## 2013-02-19 NOTE — Progress Notes (Signed)
Assisted patient OOB to chair. Patient tolerated well. Will continue to monitor. Lajuana Matte, RN

## 2013-02-19 NOTE — Progress Notes (Signed)
Subjective:  No cos, tolerating Peg tube feedings, Vein Mapping just completed for HD access Objective Vital signs in last 24 hours: Filed Vitals:   02/18/13 2315 02/19/13 0315 02/19/13 0415 02/19/13 0454  BP:   132/79   Pulse: 92 83 104   Temp:   97.8 F (36.6 C)   TempSrc:   Oral   Resp: 18 18 16    Height:      Weight:      SpO2: 98% 97% 100% 100%   Weight change:   Intake/Output Summary (Last 24 hours) at 02/19/13 1001 Last data filed at 02/19/13 0431  Gross per 24 hour  Intake     81 ml  Output    200 ml  Net   -119 ml   Labs: Basic Metabolic Panel:  Recent Labs Lab 02/15/13 0500 02/16/13 0400 02/17/13 0708  NA 138 142 143  K 3.7 4.0 3.8  CL 99 104 103  CO2 25 24 21   GLUCOSE 129* 125* 111*  BUN 74* 49* 77*  CREATININE 6.54* 5.29* 7.40*  CALCIUM 7.8* 8.2* 8.3*  PHOS  --  5.1* 5.7*   Liver Function Tests:  Recent Labs Lab 02/13/13 0420 02/16/13 0400 02/17/13 0708  AST 58*  --   --   ALT 10  --   --   ALKPHOS 60  --   --   BILITOT 0.2*  --   --   PROT 5.0*  --   --   ALBUMIN 1.8* 1.9* 1.9*   CBC:  Recent Labs Lab 02/13/13 0420 02/14/13 0500 02/15/13 0500 02/16/13 0400 02/17/13 0708  WBC 7.6 6.4 9.3 9.4 10.9*  NEUTROABS 6.0  --   --   --   --   HGB 8.0* 7.7* 8.2* 8.4* 8.2*  HCT 24.9* 23.1* 25.5* 26.2* 26.0*  MCV 90.5 91.3 91.4 91.9 91.5  PLT 150 137* 143* 166 209    CBG:  Recent Labs Lab 02/16/13 2345 02/17/13 1158 02/17/13 1617 02/18/13 0017 02/18/13 2054  GLUCAP 102* 118* 90 101* 126*     Medications: . feeding supplement (NEPRO CARB STEADY) 1,000 mL (02/18/13 1725)   . antiseptic oral rinse  15 mL Mouth Rinse QID  . calcium acetate  667 mg Oral TID WC  . chlorhexidine  15 mL Mouth Rinse BID  . [START ON 02/20/2013] darbepoetin (ARANESP) injection - DIALYSIS  100 mcg Intravenous Q Mon-HD  . feeding supplement  30 mL Per Tube BID  . free water  100 mL Per Tube Q8H  . heparin  5,000 Units Subcutaneous Q8H  . metoprolol  5  mg Intravenous Q6H  . multivitamin  1 tablet Oral QHS  . sodium chloride  3 mL Intravenous Q12H   Physical Exam:  General: Alert, NAD WM  Heart: RRR , no murmur or rub  Lungs: decr at bases, otherwise CTA  Abdomen: BS pos. Soft Peg tube in place, nontender  Extremities: Dialysis Access: No pedal edema / R IJ permcath   Assessment/Plan:  1. ESRD - new HD start 8/22. HD  MWF in hosp. / using perm cath vein map done/ VVS Access surg  ? Tuesday/ Verifyied With Pulmononary Service, Dr. Maple Hudson  needs current Trach.setting and would need LTAC Setting for HD/ no outpt. HD until Trach could be plugged./ Long term Pt. Wanting to do PD at home (was working from home setting before admit) 2. 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 PEG tube placed and tolerating Feedings . 3. Anemia -  hgb 8.2, aranesp Given last Saturday , continue q Monday hd / TFS 11% with Ferritin 1264 in the setting of his acute illness ,start  Iron with hd. 4. Secondary hyperparathyroidism - Ca+ 8.3. Phose 5.7/ start Phoslo With feedings/ check Ipth no current vit d 5. HTN/volume -bp = 132/79 and on Metoprolol for A fib. / tolerating uf on hd / am hd attempt 2.5 to 3 liters with Peg tube feedings started. 6. Nutrition - Tube feedings and  renal vitamin 7. Laryngeal CA sp XRT - Pulmonary following/ 8. Afib- metoprolol 5mg  IV q 6 hr ? Could change to po / SR with exam and on telem. 9. Aspiration [place] vs Pulmonary Edema= CX's neg/ sp Abx. 10. Deconditioning- wants to do rehab/ was working at home  Victor Pastel, PA-C [employer] Beeper [phone] 02/19/2013,10:01 AM  LOS: 9 days   Patient seen and examined.  Agree with assessment and plan as above. Victor Moselle  MD Pager [phone]    Cell  [phone] 02/19/2013, 4:06 PM

## 2013-02-20 DIAGNOSIS — D638 Anemia in other chronic diseases classified elsewhere: Secondary | ICD-10-CM

## 2013-02-20 HISTORY — PX: TRACHEOSTOMY CLOSURE: SHX458

## 2013-02-20 LAB — CBC
MCH: 30.1 pg (ref 26.0–34.0)
MCHC: 34 g/dL (ref 30.0–36.0)
Platelets: 279 10*3/uL (ref 150–400)

## 2013-02-20 LAB — RENAL FUNCTION PANEL
Albumin: 2.2 g/dL — ABNORMAL LOW (ref 3.5–5.2)
Calcium: 7.6 mg/dL — ABNORMAL LOW (ref 8.4–10.5)
GFR calc Af Amer: 5 mL/min — ABNORMAL LOW (ref >90)
GFR calc non Af Amer: 5 mL/min — ABNORMAL LOW (ref >90)
Phosphorus: 10 mg/dL — ABNORMAL HIGH (ref 2.3–4.6)
Potassium: 4.1 mEq/L (ref 3.5–5.1)
Sodium: 135 mEq/L (ref 135–145)

## 2013-02-20 LAB — GLUCOSE, CAPILLARY: Glucose-Capillary: 133 mg/dL — ABNORMAL HIGH (ref 70–99)

## 2013-02-20 MED ORDER — ACETAMINOPHEN 325 MG PO TABS
650.0000 mg | ORAL_TABLET | Freq: Once | ORAL | Status: DC
  Filled 2013-02-20: qty 2

## 2013-02-20 MED ORDER — ACETAMINOPHEN 325 MG PO TABS
ORAL_TABLET | ORAL | Status: AC
  Filled 2013-02-20: qty 1

## 2013-02-20 MED ORDER — DARBEPOETIN ALFA-POLYSORBATE 100 MCG/0.5ML IJ SOLN
INTRAMUSCULAR | Status: AC
  Filled 2013-02-20: qty 0.5

## 2013-02-20 NOTE — Progress Notes (Signed)
VASCULAR SURGERY  In hemodialysis. Has functioning catheter.  BP higher in left arm Reviewed vein map: Cephalic and basilic vein in the left arm look reasonable.  I can schedule left AVF for Thursday. If he is D/C'd before this we can arrange this as an outpt.  Waverly Ferrari, MD, FACS Beeper [phone] 02/20/2013

## 2013-02-20 NOTE — Progress Notes (Signed)
PT Cancellation Note  Patient Details Name: Victor Lane MRN: [id number] DOB: [birth year]   Cancelled Treatment:    Reason Eval/Treat Not Completed: Patient at procedure or test/unavailable (pt in HD)   Toney Sang [facility] 02/20/2013, 7:21 AM [name], PT [phone]

## 2013-02-20 NOTE — Procedures (Signed)
I have seen and examined this patient and agree with the plan of care. Tolerating dialysis well. Will need AVF prior to discharge. Mattthew Ziomek W 02/20/2013, 8:38 AM

## 2013-02-20 NOTE — Progress Notes (Signed)
PT Cancellation Note  Patient Details Name: Victor Lane MRN: [id number] DOB: [birth year]   Cancelled Treatment:    Reason Eval/Treat Not Completed: Fatigue/lethargy limiting ability to participate. Pt returned from HD but reports he needs a few hours to recover prior to mobility. Will attempt in AM and pt request up to chair with nursing.    [name] 02/20/2013, 1:34 PM [name], PT [phone]

## 2013-02-20 NOTE — Progress Notes (Signed)
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 Jearld Fenton) ,undergoing RT [name]) adm to [facility] 8/22 with confusion, dyspnea, hypoxia, hyperkalemia & AKI with cr 11/ BUN 101 - transferred to [facility] for rt IJ permacath placement then , emergent HD. PCCM consulted 8/22 pm for worsening dyspnea while on HD requiring bipap.  SIGNIFICANT EVENTS: 01/04/13 microlaryngoscopy - Jearld Fenton 8/22 Start HD, PCCM consulted for dyspnea, SVT 8/24 Failed BiPAP >> VDRF, ENT consulted 8/26 Perc trach by DF >> #8 trach 8/27 Off vent, transfer to telemetry 8/29 G tube per IR  STUDIES:    LINES / TUBES: Rt IJ permcath (IR) 8/22 >> Lt IJ CVL 8/24 >> 8/28 ETT 8/24 >> 8/26 Trach (DF) 8/26 >>  CULTURES: BCx2 8/22 >> Trach asp 8/24>>oral flora  ANTIBIOTICS: Zithromax 8/22 >> 8/22 Aztreonam 8/22 >> 8/22 Ancef 8/22 >> 8/22 Rocephin 8/22 >> 8/22 Vancomycin 8/22 >> 8/27 Cefepime 8/23 >> 8/28  SUBJECTIVE:  Started tube feed. Asking ice chips. Wants to mobilize. Sore after rectal tube. Nurse reports request sleep aid. 9/1> In Dialysis  VITAL SIGNS: Temp:  [98.4 F (36.9 C)-98.9 F (37.2 C)] 98.9 F (37.2 C) (09/01 0414) Pulse Rate:  [85-97] 87 (09/01 0416) Resp:  [16-18] 16 (09/01 0416) BP: (108-134)/(62-77) 108/62 mmHg (09/01 0414) SpO2:  [93 %-100 %] 97 % (09/01 0416) FiO2 (%):  [28 %] 28 % (09/01 0416) Weight:  [82.146 kg (181 lb 1.6 oz)] 82.146 kg (181 lb 1.6 oz) (09/01 0416)  INTAKE / OUTPUT: Intake/Output     08/31 0701 - 09/01 0700 09/01 0701 - 09/02 0700   P.O. 120    I.V. (mL/kg)     NG/GT     Total Intake(mL/kg) 120 (1.5)    Urine (mL/kg/hr)     Total Output       Net +120           Stool Occurrence 2 x      PHYSICAL EXAMINATION: General: No distress Neuro: Alert, normal strength, follows commands HEENT: trach site clean. Speaking clearly Cardiovascular: regular, no murmur Lungs: few crackles Abdomen:  New G tube Musculoskeletal:  No edema Skin:  No rash  LABS:  CBC No results found for this basename: WBC, HGB, HCT, PLT,  in the last 72 hours Coag's No results found for this basename: APTT, INR,  in the last 72 hours  BMET No results found for this basename: NA, K, CL, CO2, BUN, CREATININE, GLUCOSE,  in the last 72 hours Electrolytes No results found for this basename: CALCIUM, MG, PHOS,  in the last 72 hours ABG No results found for this basename: PHART, PCO2ART, PO2ART,  in the last 72 hours Liver Enzymes No results found for this basename: AST, ALT, ALKPHOS, BILITOT, ALBUMIN,  in the last 72 hours Glucose Recent Labs     02/19/13  0400  02/19/13  1130  02/19/13  1609  02/19/13  2023  02/19/13  2305  02/20/13  0417  GLUCAP  121*  128*  124*  111*  107*  133*    CXR: 8/28>  IMPRESSION: Stable support apparatus as described above. Slightly improved bilateral perihilar and basilar edema compared to prior exam.    ASSESSMENT /  PLAN:  PULMONARY A: Acute respiratory failure 2nd to aspiration pneumonia  Laryngeal cancer Trach status P:   continue trach collar Cont BDs Mobilize as tolerated. Note PT/OT consult 8/28  CARDIOVASCULAR A: A fib with RVR >> NSR Hx of HTN. P:  DC dilt infusion Cont metoprolol  RENAL A:  ESRD on HD. Hyperkalemia >> resolved. P:   -HD per renal-. chronic - condom cath  GASTROINTESTINAL A: Dysphagia. Hx of GERD. Diarrhea >> improving. S/P G tube 8/29.   P:    Advance tube feed as tolerated  HEMATOLOGIC A: Anemia of critical illness and chronic disease. Hx of laryngeal cancer s/p XRT. P:  -f/u CBC               8/30 = 8.2 Hg -transfuse for Hb < 7 -aranesp per renal -SQ heparin for DVT  prevention - for CBC 9/1  INFECTIOUS A:  HCAP, aspiration P:   Micro and abx as above  ENDOCRINE A:  DM-2   P:   Holding glyburide Not requiring coverage- Will change as feeding begins Cont CBGs Resume SSI for glu > 180  NEUROLOGIC A:  Encephalopathy d/t uremia and hypoxemia,  resolved. Pain Deconditioning. P:   Cont prn fentanyl Cont PT/OT Discharge planning   Epimenio Schetter M. Kriste Basque, MD PCCM 02/20/13 @ 7:35AM

## 2013-02-21 ENCOUNTER — Ambulatory Visit: Payer: Medicare Other

## 2013-02-21 ENCOUNTER — Encounter: Payer: Self-pay | Admitting: *Deleted

## 2013-02-21 ENCOUNTER — Encounter (HOSPITAL_COMMUNITY): Payer: Self-pay | Admitting: Anesthesiology

## 2013-02-21 ENCOUNTER — Inpatient Hospital Stay (HOSPITAL_COMMUNITY): Payer: Medicare Other

## 2013-02-21 LAB — GLUCOSE, CAPILLARY
Glucose-Capillary: 115 mg/dL — ABNORMAL HIGH (ref 70–99)
Glucose-Capillary: 129 mg/dL — ABNORMAL HIGH (ref 70–99)

## 2013-02-21 LAB — PARATHYROID HORMONE, INTACT (NO CA): PTH: 906.5 pg/mL — ABNORMAL HIGH (ref 14.0–72.0)

## 2013-02-21 LAB — PROCALCITONIN: Procalcitonin: 2.23 ng/mL

## 2013-02-21 MED ORDER — METOPROLOL TARTRATE 25 MG/10 ML ORAL SUSPENSION
12.5000 mg | Freq: Two times a day (BID) | ORAL | Status: DC
  Administered 2013-02-21 – 2013-02-25 (×7): 12.5 mg
  Filled 2013-02-21 (×10): qty 5

## 2013-02-21 MED ORDER — ALPRAZOLAM 0.25 MG PO TABS
0.2500 mg | ORAL_TABLET | Freq: Every evening | ORAL | Status: DC | PRN
  Administered 2013-02-21 – 2013-02-23 (×2): 0.25 mg
  Filled 2013-02-21 (×2): qty 1

## 2013-02-21 MED ORDER — DIPHENOXYLATE-ATROPINE 2.5-0.025 MG PO TABS: 2.0000 | ORAL_TABLET | Freq: Four times a day (QID) | ORAL | Status: DC | PRN

## 2013-02-21 MED ORDER — CEFAZOLIN SODIUM 1-5 GM-% IV SOLN
1.0000 g | INTRAVENOUS | Status: AC
  Filled 2013-02-21: qty 50

## 2013-02-21 MED FILL — Fentanyl Citrate Inj 0.05 MG/ML: INTRAMUSCULAR | Qty: 2 | Status: AC

## 2013-02-21 MED FILL — Midazolam HCl Inj 2 MG/2ML (Base Equivalent): INTRAMUSCULAR | Qty: 2 | Status: AC

## 2013-02-21 NOTE — Progress Notes (Signed)
[facility] ROUNDING NOTE   Subjective:   Interval History: still has foley and trach. No specific complaints but worried about progression and discharge plans  Objective:  Vital signs in last 24 hours:  Temp:  [98 F (36.7 C)-98.3 F (36.8 C)] 98.2 F (36.8 C) (09/02 0500) Pulse Rate:  [72-112] 90 (09/02 0500) Resp:  [12-24] 12 (09/02 0500) BP: (94-144)/(48-80) 94/58 mmHg (09/02 0500) SpO2:  [96 %-100 %] 98 % (09/02 0500) FiO2 (%):  [28 %] 28 % (09/02 0219) Weight:  [78.2 kg (172 lb 6.4 oz)-80.4 kg (177 lb 4 oz)] 78.2 kg (172 lb 6.4 oz) (09/02 0500)  Weight change: 1.854 kg (4 lb 1.4 oz) Filed Weights   02/20/13 0730 02/20/13 1210 02/21/13 0500  Weight: 84 kg (185 lb 3 oz) 80.4 kg (177 lb 4 oz) 78.2 kg (172 lb 6.4 oz)    Intake/Output: I/O last 3 completed shifts: In: 75 [Other:25; NG/GT:740] Out: 2200 [Other:2200]   Intake/Output this shift:  Total I/O In: -  Out: 1 [Stool:1]  General: Alert,  Heart: RRR , no murmur or rub  Lungs: decr at bases, otherwise CTA  Abdomen:. Soft Peg tube in place, nontender  Extremities: Dialysis Access: No pedal edema / R IJ permcath     Basic Metabolic Panel:  Recent Labs Lab 02/15/13 0500 02/16/13 0400 02/17/13 0708 02/20/13 0654  NA 138 142 143 135  K 3.7 4.0 3.8 4.1  CL 99 104 103 89*  CO2 25 24 21 25   GLUCOSE 129* 125* 111* 115*  BUN 74* 49* 77* 98*  CREATININE 6.54* 5.29* 7.40* 10.05*  CALCIUM 7.8* 8.2* 8.3* 7.6*  PHOS  --  5.1* 5.7* 10.0*    Liver Function Tests:  Recent Labs Lab 02/16/13 0400 02/17/13 0708 02/20/13 0654  ALBUMIN 1.9* 1.9* 2.2*   No results found for this basename: LIPASE, AMYLASE,  in the last 168 hours No results found for this basename: AMMONIA,  in the last 168 hours  CBC:  Recent Labs Lab 02/15/13 0500 02/16/13 0400 02/17/13 0708 02/20/13 0654  WBC 9.3 9.4 10.9* 12.7*  HGB 8.2* 8.4* 8.2* 8.1*  HCT 25.5* 26.2* 26.0* 23.8*  MCV 91.4 91.9 91.5 88.5  PLT  143* 166 209 279    Cardiac Enzymes: No results found for this basename: CKTOTAL, CKMB, CKMBINDEX, TROPONINI,  in the last 168 hours  BNP: No components found with this basename: POCBNP,   CBG:  Recent Labs Lab 02/20/13 1619 02/20/13 2006 02/20/13 2359 02/21/13 0450 02/21/13 0811  GLUCAP 132* 123* 100* 116* 103*    Microbiology: Results for orders placed during the hospital encounter of 02/10/13  MRSA PCR SCREENING     Status: None   Collection Time    02/10/13  6:44 PM      Result Value Range Status   MRSA by PCR NEGATIVE  NEGATIVE Final   Comment:            The GeneXpert MRSA Assay (FDA     approved for NASAL specimens     only), is one component of a     comprehensive MRSA colonization     surveillance program. It is not     intended to diagnose MRSA     infection nor to guide or     monitor treatment for     MRSA infections.  CULTURE, BLOOD (ROUTINE X 2)     Status: None   Collection Time    02/10/13  7:28 PM  Result Value Range Status   Specimen Description BLOOD LEFT ARM   Final   Special Requests BOTTLES DRAWN AEROBIC AND ANAEROBIC 10CC   Final   Culture  Setup Time     Final   Value: 02/11/2013 01:15     Performed at [facility]   Culture     Final   Value: NO GROWTH 5 DAYS     Performed at [facility]   Report Status 02/17/2013 FINAL   Final  CULTURE, BLOOD (ROUTINE X 2)     Status: None   Collection Time    02/10/13  7:28 PM      Result Value Range Status   Specimen Description BLOOD LEFT ARM   Final   Special Requests BOTTLES DRAWN AEROBIC AND ANAEROBIC 10CC   Final   Culture  Setup Time     Final   Value: 02/11/2013 01:15     Performed at [facility]   Culture     Final   Value: NO GROWTH 5 DAYS     Performed at [facility]   Report Status 02/17/2013 FINAL   Final  CULTURE, RESPIRATORY (NON-EXPECTORATED)     Status: None   Collection Time    02/12/13 11:33 AM      Result Value Range Status    Specimen Description TRACHEAL ASPIRATE   Final   Special Requests Normal   Final   Gram Stain     Final   Value: ABUNDANT WBC PRESENT, PREDOMINANTLY PMN     RARE SQUAMOUS EPITHELIAL CELLS PRESENT     NO ORGANISMS SEEN     Performed at [facility]   Culture     Final   Value: Non-Pathogenic Oropharyngeal-type Flora Isolated.     Performed at [facility]   Report Status 02/15/2013 FINAL   Final    Coagulation Studies: No results found for this basename: LABPROT, INR,  in the last 72 hours  Urinalysis: No results found for this basename: COLORURINE, APPERANCEUR, LABSPEC, PHURINE, GLUCOSEU, HGBUR, BILIRUBINUR, KETONESUR, PROTEINUR, UROBILINOGEN, NITRITE, LEUKOCYTESUR,  in the last 72 hours    Imaging: No results found.   Medications:   . feeding supplement (NEPRO CARB STEADY) 1,000 mL (02/21/13 0506)   . acetaminophen  650 mg Oral Once  . antiseptic oral rinse  15 mL Mouth Rinse QID  . calcium acetate  667 mg Oral TID WC  . chlorhexidine  15 mL Mouth Rinse BID  . darbepoetin (ARANESP) injection - DIALYSIS  100 mcg Intravenous Q Mon-HD  . feeding supplement  30 mL Per Tube BID  . ferric gluconate (FERRLECIT/NULECIT) IV  125 mg Intravenous Q M,W,F-HD  . free water  100 mL Per Tube Q8H  . heparin  5,000 Units Subcutaneous Q8H  . metoprolol tartrate  12.5 mg Per Tube BID  . multivitamin  1 tablet Oral QHS  . sodium chloride  3 mL Intravenous Q12H   ALPRAZolam, heparin, heparin, hydrocortisone, levalbuterol, lidocaine (PF), lidocaine-prilocaine, ondansetron (ZOFRAN) IV, pentafluoroprop-tetrafluoroeth, zolpidem  Assessment/ Plan:  1.ESRD - new HD start 8/22. HD MWF in hosp. / using perm cath vein map done/ VVS to evaluate Tuesday/ Verifyied With Pulmononary Service, Dr. Maple Hudson needs current Trach.setting and would need LTAC Setting for HD/ no outpt. HD until Trach could be plugged./ Long term Pt. Wanting to do PD at home (was working from home setting before  admit) 2.Dysphagia- result of radiation for laryngeal cancer.. PEG tube placed and tolerating Feedings .  3 Anemia - hgb 8's, aranesp Given last Saturday , continue q Monday hd / TFS 11% with Ferritin 1264 in the setting of his acute illness . 4. Secondary hyperparathyroidism - Ca+ 8.3. Phose 10 /  Phoslo With feedings/ we shall follow hopefully will start oral feeding soon  5HTN/volume -bp = 100/79 and on Metoprolol for A fib. / tolerating uf on hd / am hd attempt 2.5 to 3 liters with Peg tube feedings started. 6 Nutrition - Tube feedings and renal vitamin 7. Laryngeal CA sp XRT - Pulmonary following 8.Aspiration [place] vs Pulmonary Edema= CX's neg/ sp Abx.  9. Deconditioning- wants to do rehab/ was working at home 10. Very complicated issues that will need to be addressed prior to discharge     LOS: [address] W @TODAY @10 :55 AM

## 2013-02-21 NOTE — Progress Notes (Signed)
Mr. Victor Lane admitted to adm to [facility] 8/22 with confusion, dyspnea, hypoxia, hyperkalemia & AKI with cr 11/ BUN 101 - transferred to [facility] for rt IJ permacath placement then , emergent HD. PCCM consulted 8/22 pm for worsening dyspnea while on HD requiring bipap. He is now an inpatient on [facility].  He was initially placed on a ventilator and required a placement of a trach and a G-tube.  He also had SVT which has resolved and his rhythm for the past two days has been Normal Sinus Rhythm. He received dialysis for acute renal failure on 02/18/13 and 02/20/13.  His attending physician gave orders for patient not to be treated today and Dr. Basilio Cairo, therapist on machine #1 and Ferd Glassing were notified.

## 2013-02-21 NOTE — Progress Notes (Signed)
Respiratory therapy note-Trach down sized today to #6 without difficulty with Dr. Tyson Alias. Red tracheal plug used and patient has had it on all day. States that it is more comfortable and breaths better with it in. Discussed that trach education will be done in the morning and I went over some education this evening. Wife was at the bedside.

## 2013-02-21 NOTE — Procedures (Signed)
Trach change  First Dc sutures Removed 8 cuff easily replaced with 6 cuffless  Inner cannula placed No bleeding Cap pos Equal BS Did well replaced PMV, improved stong voice  .Mcarthur Rossetti. Tyson Alias, MD, FACP Pgr: [phone] [facility]

## 2013-02-21 NOTE — Procedures (Signed)
Objective Swallowing Evaluation: Modified Barium Swallowing Study  Patient Details  Name: Victor Lane MRN: [id number] Date of Birth: Aug 08, 1945  Today's Date: 02/21/2013 Time: 1330-1350 SLP Time Calculation (min): 20 min  Past Medical History:  Past Medical History  Diagnosis Date  . Diabetes mellitus   . Hypertension   . GERD (gastroesophageal reflux disease)   . Fibromyalgia   . Chest pain   . Carotid artery occlusion   . Chronic kidney disease     - Stage 5- follwed by Dr Hyman Hopes  . Complication of anesthesia     agiation when he awaken  . Neuropathy   . Arthritis   . Sleep apnea   . H/O vitamin D deficiency   . Hearing loss   . Hypercholesteremia   . Myalgia and myositis   . 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 Cholecystectomy  1993  . Rotator cuff repair  07/06/2011    right  . Carotid endarterectomy Left   . Coolonoscopy    . Colonoscopy w/ biopsies and polypectomy      Hx: of  . 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 Jearld Fenton, final pathology report 01/04/2013) currently undergoing radiation therapy under Dr. Basilio Cairo care, now presenting to [facility] long emergency department after found to be more lethargic at home and difficult to by his wife. Patient explains he has felt progressively weaker over the past several days and unable to ambulate, went to bed last night and provide  unable to wake up this morning. Wife also explains patient has been more confused over the past several days. Patient also reports progressively worsening shortness of breath and feeling that he cannot take full deep breath, worse with exertion and somewhat improved with rest, 2-3 pillow orthopnea. Patient denies chest pain, no specific abdominal concerns. Patient explains he is still able to void but has sensation of incomplete voiding. Patient denies dysuria, urinary urgency or frequency, no flank pain, no fevers, and no chills. Patient denies specific focal neurological symptoms, no dizziness, no palpitations, no visual changes. Patient explains he was first diagnosed with kidney disease about 7 years ago but has never had any workup done. He has an appointment scheduled with Dr. Hyman Hopes this month for further discussion on peritoneal dialysis. Patient reports he is not interested in other forms of hemodialysis other than peritoneal one.     Assessment / Plan / Recommendation Clinical Impression  Dysphagia Diagnosis: Mild pharyngeal phase dysphagia Clinical impression: Pt presents with significant improvement in pharyngeal swallow function with mild sensorimotor deficits noted during today's repeat objective study. Pharyngeal phase is characterized by decreased movement of pharyngeal musculature s/p radiation treatment as well as anatomical changes noted during 8/27 resulting in decreased vocal fold adduction, which lead to decreased airway protection and pharyngeal clearance. Trace, shallow penetration of thin liquids was observed only during straw sips and mixed consistency boluses, with all penetrates effectively cleared with a cued  throat clear. Mild pharyngeal residue at the valleculae and CP segment are reduced with a second swallow, which is intermittently reflexive. Recommend to initiate trial diet of Dys. 3 textures to facilitate clearance and thin liquids with no straws.    Treatment  Recommendation  Therapy as outlined in treatment plan below    Diet Recommendation Dysphagia 3 (Mechanical Soft);Thin liquid   Liquid Administration via: Cup;No straw Medication Administration: Whole meds with puree Supervision: Patient able to self feed;Full supervision/cueing for compensatory strategies Compensations: Slow rate;Small sips/bites;Multiple dry swallows after each bite/sip Postural Changes and/or Swallow Maneuvers: Seated upright 90 degrees    Other  Recommendations Oral Care Recommendations: Oral care BID Other Recommendations: Place PMSV during PO intake   Follow Up Recommendations  Home health SLP;Outpatient SLP    Frequency and Duration min 2x/week  2 weeks   Pertinent Vitals/Pain N/A    SLP Swallow Goals Patient will consume recommended diet without observed clinical signs of aspiration with: Supervision/safety Patient will utilize recommended strategies during swallow to increase swallowing safety with: Supervision/safety   General HPI: Pt is 67 yo male with multiple and complex medical history including end-stage kidney disease, diabetes mellitus, long history of tobacco abuse but quit 16 years ago, recent diagnosis of 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 Jearld Fenton, final pathology report 01/04/2013) currently undergoing radiation therapy under Dr. Basilio Cairo care, now presenting to [facility] long emergency department after found to be more lethargic at home and difficult to by his wife. Patient explains he has felt progressively weaker over the past several days and unable to ambulate, went to bed last night and provide unable to wake up this morning. Wife also explains patient has been more confused over the past several days. Patient also reports progressively worsening shortness of breath and feeling that he cannot take full deep breath, worse with exertion and somewhat  improved with rest, 2-3 pillow orthopnea. Patient denies chest pain, no specific abdominal concerns. Patient explains he is still able to void but has sensation of incomplete voiding. Patient denies dysuria, urinary urgency or frequency, no flank pain, no fevers, and no chills. Patient denies specific focal neurological symptoms, no dizziness, no palpitations, no visual changes. Patient explains he was first diagnosed with kidney disease about 7 years ago but has never had any workup done. He has an appointment scheduled with Dr. Hyman Hopes this month for further discussion on peritoneal dialysis. Patient reports he is not interested in other forms of hemodialysis other than peritoneal one. Type of Study: Modified Barium Swallowing Study Reason for Referral: Objectively evaluate swallowing function Previous Swallow Assessment: FEES 8/27 (recommend NPO) Diet Prior to this Study: NPO;PEG tube Temperature Spikes Noted: No Respiratory Status: Trach (downsized and capped today) Trach Size and Type: Uncuffed;#6;Other (Comment) (capped) History of Recent Intubation: No Behavior/Cognition: Alert;Cooperative;Pleasant mood Oral Cavity - Dentition: Adequate natural dentition Oral Motor / Sensory Function: Within functional limits Self-Feeding Abilities: Able to feed self Patient Positioning: Upright in chair Baseline Vocal Quality: Hoarse (Baseline after removal of VC lesions (SCCa)) Volitional Cough: Strong Volitional Swallow: Able to elicit Anatomy: Other (Comment) (Excised sections of VC bilaterally-unable to view given MBS) Pharyngeal Secretions: Not observed secondary MBS    Reason for Referral Objectively evaluate swallowing function   Oral Phase Oral Preparation/Oral Phase Oral Phase: WFL   Pharyngeal Phase Pharyngeal Phase Pharyngeal Phase: Impaired Pharyngeal - Honey Pharyngeal - Honey Cup: Not tested Pharyngeal - Nectar Pharyngeal -  Nectar Teaspoon: Reduced anterior laryngeal mobility;Reduced  laryngeal elevation;Reduced tongue base retraction;Pharyngeal residue - valleculae;Pharyngeal residue - cp segment;Compensatory strategies attempted (Comment) (second swallow) Pharyngeal - Nectar Cup: Reduced anterior laryngeal mobility;Reduced laryngeal elevation;Reduced tongue base retraction;Pharyngeal residue - valleculae;Pharyngeal residue - cp segment;Compensatory strategies attempted (Comment) (second swallow) Pharyngeal - Thin Pharyngeal - Thin Teaspoon: Reduced anterior laryngeal mobility;Reduced laryngeal elevation;Reduced tongue base retraction;Pharyngeal residue - valleculae;Pharyngeal residue - cp segment;Compensatory strategies attempted (Comment) (second swallow) Pharyngeal - Thin Cup: Reduced anterior laryngeal mobility;Reduced laryngeal elevation;Reduced tongue base retraction;Pharyngeal residue - valleculae;Pharyngeal residue - cp segment;Compensatory strategies attempted (Comment) (second swallow) Pharyngeal - Thin Straw: Reduced anterior laryngeal mobility;Reduced laryngeal elevation;Reduced tongue base retraction;Pharyngeal residue - valleculae;Pharyngeal residue - cp segment;Compensatory strategies attempted (Comment);Penetration/Aspiration during swallow (second swallow) Penetration/Aspiration details (thin straw): Material enters airway, remains ABOVE vocal cords and not ejected out Pharyngeal - Solids Pharyngeal - Puree: Reduced anterior laryngeal mobility;Reduced laryngeal elevation;Reduced tongue base retraction;Pharyngeal residue - valleculae;Pharyngeal residue - cp segment;Compensatory strategies attempted (Comment) (second swallow) Penetration/Aspiration details (puree): Material does not enter airway Pharyngeal - Mechanical Soft: Reduced anterior laryngeal mobility;Reduced laryngeal elevation;Reduced tongue base retraction;Pharyngeal residue - valleculae;Pharyngeal residue - cp segment;Compensatory strategies attempted (Comment) (second swallow) Pharyngeal - Pill: Reduced  anterior laryngeal mobility;Reduced laryngeal elevation;Reduced tongue base retraction;Pharyngeal residue - valleculae;Pharyngeal residue - cp segment;Compensatory strategies attempted (Comment);Penetration/Aspiration during swallow (penetration of thin liquid during pill consumption) Penetration/Aspiration details (pill): Material enters airway, remains ABOVE vocal cords and not ejected out (thin liquid penetrates during pill administration)  Cervical Esophageal Phase    GO    Cervical Esophageal Phase Cervical Esophageal Phase: Impaired Cervical Esophageal Phase - Nectar Nectar Teaspoon: Reduced cricopharyngeal relaxation Nectar Cup: Reduced cricopharyngeal relaxation Cervical Esophageal Phase - Thin Thin Teaspoon: Reduced cricopharyngeal relaxation Thin Cup: Reduced cricopharyngeal relaxation Thin Straw: Reduced cricopharyngeal relaxation Cervical Esophageal Phase - Solids Puree: Reduced cricopharyngeal relaxation Mechanical Soft: Reduced cricopharyngeal relaxation Pill: Reduced cricopharyngeal relaxation         Maxcine Ham 02/21/2013, 2:32 PM  Maxcine Ham, M.A. CCC-SLP

## 2013-02-21 NOTE — Progress Notes (Signed)
Physical Therapy Treatment Patient Details Name: Victor Lane MRN: [id number] DOB: 1945/12/24 Today's Date: 02/21/2013 Time: 4098-1191 PT Time Calculation (min): 34 min  PT Assessment / Plan / Recommendation  History of Present Illness Pt admitted with AMS and hypoxia.  Was undergoing radiation for laryngeal carcinoma.  Now with trach, dysphagia (plan for PEG tube), new ESRD, afib with RVR.     PT Comments   Pt with excellent progression with mobility today. Pt maintaining sats 93-100% on RA at rest but unable to get good reading with movement and maintained trach collar at 28% pt with brief period of lateral left thigh pain which pt reports is baseline prior to admission. Will continue to follow and recommend pt continue daily ambulation, self care with staff.  Follow Up Recommendations        Does the patient have the potential to tolerate intense rehabilitation     Barriers to Discharge        Equipment Recommendations       Recommendations for Other Services    Frequency     Progress towards PT Goals Progress towards PT goals: Progressing toward goals  Plan Current plan remains appropriate    Precautions / Restrictions Precautions Precautions: Fall Precaution Comments: trach   Pertinent Vitals/Pain No pain at rest sats 99% on RA    Mobility  Bed Mobility Bed Mobility: Not assessed Details for Bed Mobility Assistance: pt on BSC on arrival and recliner end of session Transfers Sit to Stand: 4: Min guard;From chair/3-in-1 Stand to Sit: 5: Supervision;To chair/3-in-1 Details for Transfer Assistance: cueing for hand placement and safety Ambulation/Gait Ambulation/Gait Assistance: 5: Supervision Ambulation Distance (Feet): 250 Feet Assistive device: Rolling walker Ambulation/Gait Assistance Details: cueing for posture.  Gait Pattern: Step-through pattern;Decreased stride length Gait velocity: decreased Stairs: No    Exercises General Exercises - Lower  Extremity Long Arc Quad: AROM;Both;20 reps;Seated Hip ABduction/ADduction: AROM;Both;20 reps;Seated Hip Flexion/Marching: AROM;Both;20 reps;Seated Toe Raises: AROM;Both;20 reps;Seated Heel Raises: AROM;Seated;Both;20 reps   PT Diagnosis:    PT Problem List:   PT Treatment Interventions:     PT Goals (current goals can now be found in the care plan section)    Visit Information  Last PT Received On: 02/21/13 Assistance Needed: +1 History of Present Illness: Pt admitted with AMS and hypoxia.  Was undergoing radiation for laryngeal carcinoma.  Now with trach, dysphagia (plan for PEG tube), new ESRD, afib with RVR.      Subjective Data      Cognition  Cognition Arousal/Alertness: Awake/alert Behavior During Therapy: WFL for tasks assessed/performed Overall Cognitive Status: Within Functional Limits for tasks assessed    Balance     End of Session PT - End of Session Equipment Utilized During Treatment: Gait belt;Oxygen Activity Tolerance: Patient tolerated treatment well Patient left: in chair;with call bell/phone within reach;with family/visitor present Nurse Communication: Mobility status   GP     [name] 02/21/2013, 12:42 PM [name], PT [phone]

## 2013-02-21 NOTE — Progress Notes (Signed)
NUTRITION FOLLOW UP/CONSULT  Intervention:   1. Continue Nepro @  45 ml/hr. 30 ml Prostat BID. This provides, tube feeding regimen will provide 2144 kcal, 117 grams of protein, and 785 ml of H2O.    2. Will add free water flushes of 100 ml TID. Renal MD: adjust free water as needed.  3. Family desires nocturnal feeding to allow for time free from feeding pump. Once pt is able to tolerate TF can transition to the following: Nepro @ 90 ml/hr over 12 hours and 30 ml Pro-stat BID. This will provide the same calories and protein as the 24 hour regimen.   4. Pt reports diarrhea, recommend addition of anti-diarrheal if able as unable to change formula at this time.   Nutrition Dx:   Inadequate oral intake related to inability to eat as evidenced by NPO status; ongoing.   Goal:   Pt to meet >/=90% estimated nutrition needs. Not met.   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 Per nephrology pt will be HD dependent from now on.  Pt seen by SLP and has been made NPO after FEES. Attempted NGT placement x3 and was unsuccessful and PEG was decided on. Placed today by radiology. RD consulted for initiation and management of enteral nutrition once G tube cleared for use.   Currently pt at goal rate with tube feeding of Nepro @ 45. Pt reports that he has had diarrhea since initiation of enteral nutrition. Per chart review pt has had 3 BM's so far today, but only 2 all day yesterday. Continues on HD, phosphorous remains elevated. Unable to change to another formula at this time given electrolyte composition. This facility also does not carry a fiber additive. Recommend adding an anti-diarrheal agent if able to help with diarrhea.     Height: Ht Readings from Last 1 Encounters:  02/12/13 5\' 10"  (1.778 m)    Weight Status:   Wt Readings from Last 1 Encounters:  02/21/13 172 lb 6.4 oz (78.2 kg)  admission weight 194 lbs. Weight loss related to fluid status?   Re-estimated needs:  Kcal: 2100-2300  Protein: 105-115 grams  Fluid: >1.2 L/day  Skin: intact   Diet Order: NPO   Intake/Output Summary (Last 24 hours) at 02/21/13 1103 Last data filed at 02/21/13 0900  Gross per 24 hour  Intake    765 ml  Output   2201 ml  Net  -1436 ml  Net -3.7 L this admission   Last BM: 9/2   Labs:   Recent Labs Lab 02/16/13 0400 02/17/13 0708 02/20/13 0654  NA 142 143 135  K 4.0 3.8 4.1  CL 104 103 89*  CO2 24 21 25   BUN 49* 77* 98*  CREATININE 5.29* 7.40* 10.05*  CALCIUM 8.2* 8.3* 7.6*  PHOS 5.1* 5.7* 10.0*  GLUCOSE 125* 111* 115*    CBG (last 3)   Recent Labs  02/20/13 2359 02/21/13 0450 02/21/13 0811  GLUCAP 100* 116* 103*    Scheduled Meds: . acetaminophen  650 mg Oral Once  . antiseptic oral rinse  15 mL Mouth Rinse QID  . calcium acetate  667 mg Oral TID WC  . chlorhexidine  15 mL Mouth Rinse BID  .  darbepoetin (ARANESP) injection - DIALYSIS  100 mcg Intravenous Q Mon-HD  . feeding supplement  30 mL Per Tube BID  . ferric gluconate (FERRLECIT/NULECIT) IV  125 mg Intravenous Q M,W,F-HD  . free water  100 mL Per Tube Q8H  . heparin  5,000 Units Subcutaneous Q8H  . metoprolol tartrate  12.5 mg Per Tube BID  . multivitamin  1 tablet Oral QHS  . sodium chloride  3 mL Intravenous Q12H    Continuous Infusions: . feeding supplement (NEPRO CARB STEADY) 1,000 mL (02/21/13 0506)   Clarene Duke RD, LDN Pager [phone] After Hours pager [phone]

## 2013-02-21 NOTE — Progress Notes (Signed)
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 Jearld Fenton) ,undergoing RT [name]) adm to [facility] 8/22 with confusion, dyspnea, hypoxia, hyperkalemia & AKI with cr 11/ BUN 101 - transferred to [facility] for rt IJ permacath placement then , emergent HD. PCCM consulted 8/22 pm for worsening dyspnea while on HD requiring bipap.  SIGNIFICANT EVENTS: 01/04/13 microlaryngoscopy - Jearld Fenton 8/22 Start HD, PCCM consulted for dyspnea, SVT 8/24 Failed BiPAP >> VDRF, ENT consulted 8/26 Perc trach by DF >> #8 trach 8/27 Off vent, transfer to telemetry 8/29 G tube per IR  STUDIES:    LINES / TUBES: Rt IJ permcath (IR) 8/22 >> Lt IJ CVL 8/24 >> 8/28 ETT 8/24 >> 8/26 Trach (DF) 8/26 >>  CULTURES: BCx2 8/22 >> Trach asp 8/24>>oral flora  ANTIBIOTICS: Zithromax 8/22 >> 8/22 Aztreonam 8/22 >> 8/22 Ancef 8/22 >> 8/22 Rocephin 8/22 >> 8/22 Vancomycin 8/22 >> 8/27 Cefepime 8/23 >> 8/28  SUBJECTIVE:  Anxious, not getting sleep   VITAL SIGNS: Temp:  [98 F (36.7 C)-98.3 F (36.8 C)] 98.2 F (36.8 C) (09/02 0500) Pulse Rate:  [72-112] 90 (09/02 0500) Resp:  [12-24] 12 (09/02 0500) BP: (94-144)/(48-80) 94/58 mmHg (09/02 0500) SpO2:  [96 %-100 %] 98 % (09/02 0500) FiO2 (%):  [28 %] 28 % (09/02 0219) Weight:  [78.2 kg (172 lb 6.4 oz)-80.4 kg (177 lb 4 oz)] 78.2 kg (172 lb 6.4 oz) (09/02 0500)  INTAKE / OUTPUT: Intake/Output     09/01 0701 - 09/02 0700 09/02 0701 - 09/03 0700   P.O.     Other 25    NG/GT 740    Total Intake(mL/kg) 765 (9.8)    Other 2200    Total Output 2200     Net -1435          Stool Occurrence 3 x      PHYSICAL EXAMINATION: General: No distress Neuro: Alert, normal  strength, follows commands HEENT: trach site clean. Speaking clearly Cardiovascular: regular, no murmur Lungs: clear  Abdomen:  New G tube Musculoskeletal:  No edema Skin:  No rash  LABS:  CBC Recent Labs     02/20/13  0654  WBC  12.7*  HGB  8.1*  HCT  23.8*  PLT  279   Coag's No results found for this basename: APTT, INR,  in the last 72 hours  BMET Recent Labs     02/20/13  0654  NA  135  K  4.1  CL  89*  CO2  25  BUN  98*  CREATININE  10.05*  GLUCOSE  115*   Electrolytes Recent Labs     02/20/13  0654  CALCIUM  7.6*  PHOS  10.0*   ABG No results found for this basename: PHART, PCO2ART, PO2ART,  in the last 72 hours Liver Enzymes Recent Labs     02/20/13  0654  ALBUMIN  2.2*   Glucose Recent Labs     02/20/13  1156  02/20/13  1619  02/20/13  2006  02/20/13  2359  02/21/13  0450  02/21/13  0811  GLUCAP  100*  132*  123*  100*  116*  103*    CXR: 8/28>  IMPRESSION: Stable support  apparatus as described above. Slightly improved bilateral perihilar and basilar edema compared to prior exam.   ASSESSMENT / PLAN:  Laryngeal cancer Trach status-->will be an issue re: options for HD centers.  P:   Will down size to 6, begin capping trials (will need to be able to tolerate red-cap while at HD in order to be eligible for HD at a center).  If fails red-cap trial will look at [facility] for more time  NPO for now until passes swallow eval  Goal to restart XRT asap, although acknowledge this will potentially stall swallowing progress. We may need to balance these goals.   Chills.--> wbc climbing P: CXR, UC, and PCT today F/u cbc   PAF with RVR >> NSR Hx of HTN. P:  Cont metoprolol-->change to vt route  ESRD on HD. P:   -HD per renal-. Chronic - for AVF on Thursday  - condom cath - will d/w CM, need to find out re: HD center options w/ chronic trach   Dysphagia-->pt and wife feel he has improved and are requesting repeat swallow eval. Seems  reasonable.  Hx of GERD. Diarrhea -->still keeping him up all night. Will ask nutrition to re-eval.  S/P G tube 8/29.   P:   Will have SLP repeat swallow eval Will ask RD to re-eval nutritional regimen ? More fiber?? May be non-issue if can tol diet.    Anemia of critical illness and chronic disease. P:  -f/u CBC -transfuse for Hb < 7 -aranesp per renal -SQ heparin for DVT prevention  DM-2   CBG (last 3)   Recent Labs  02/20/13 2359 02/21/13 0450 02/21/13 0811  GLUCAP 100* 116* 103*   P:   Holding glyburide Resume SSI for glu > 180  Anxiety -->can't sleep  Pain-->tolerable, mostly just annoyed by abd brace  Deconditioning-->working w/ PT P:   Try low dose xanax Cont PT/OT Discharge planning   Levy Pupa, MD, PhD 02/21/2013, 1:53 PM [facility] [phone] or if no answer [phone]

## 2013-02-21 NOTE — Progress Notes (Signed)
Visited patient at [facility] Friday afternoon, 8/29; wife and son also present.  Pt in good spirits, encouraged by progress he's been making the past several days.  Wife indicated they home to be home early this week.  Will continue to follow.  Young Berry, RN, BSN, [facility] Navigator [phone]

## 2013-02-21 NOTE — Progress Notes (Signed)
VASCULAR PROGRESS NOTE  SUBJECTIVE: No specific complaints.  PHYSICAL EXAM: Filed Vitals:   02/20/13 1628 02/20/13 1724 02/20/13 2007 02/21/13 0500  BP:  108/68 105/67 94/58  Pulse: 100 72 99 90  Temp:   98.3 F (36.8 C) 98.2 F (36.8 C)  TempSrc:   Oral Oral  Resp: 18  18 12   Height:      Weight:    172 lb 6.4 oz (78.2 kg)  SpO2: 99%  96% 98%   Palpable left radial pulse  LABS: Lab Results  Component Value Date   WBC 12.7* 02/20/2013   HGB 8.1* 02/20/2013   HCT 23.8* 02/20/2013   MCV 88.5 02/20/2013   PLT 279 02/20/2013   ASSESSMENT AND PLAN:  * I have scheduled pt for left AVF tomorrow by Dr. Imogene Burn as his 2nd case. I have asked HD to do his HD at 6 AM so as not to interfere with the OR schedule if possible. He appears to be a reasonable candidate for a left radialcephalic or brachialcephalic AVF. Orders written.  Cari Caraway Beeper: [phone] 02/21/2013

## 2013-02-21 NOTE — Progress Notes (Signed)
Occupational Therapy Treatment Patient Details Name: YIDA HYAMS MRN: [id number] DOB: Oct 19, 1945 Today's Date: 02/21/2013 Time: 4098-1191 OT Time Calculation (min): 29 min  OT Assessment / Plan / Recommendation  History of present illness Pt admitted with AMS and hypoxia.  Was undergoing radiation for laryngeal carcinoma.  Now with trach, dysphagia (plan for PEG tube), new ESRD, afib with RVR.     OT comments  Pt demonstrates increasing independence with BADLs.  Currently is at min A to min guard assist level.  He does fatigue fairly quickly.  Pt noted to ask same questions repetitively with no apparent awareness of doing so.  Recommend 24 hour supervision at discharge.   Follow Up Recommendations  No OT follow up    Barriers to Discharge       Equipment Recommendations  None recommended by OT    Recommendations for Other Services    Frequency Min 2X/week   Progress towards OT Goals Progress towards OT goals: Progressing toward goals  Plan Discharge plan remains appropriate    Precautions / Restrictions Precautions Precautions: Fall Precaution Comments: trach Restrictions Weight Bearing Restrictions: No   Pertinent Vitals/Pain     ADL  Eating/Feeding: NPO Grooming: Wash/dry hands;Min guard Where Assessed - Grooming: Supported standing Lower Body Dressing: Minimal assistance Where Assessed - Lower Body Dressing: Supported sit to Pharmacist, hospital: Hydrographic surveyor Method: Sit to stand;Stand pivot Acupuncturist: Comfort height toilet;Grab bars Toileting - Architect and Hygiene: Min guard Where Assessed - Engineer, mining and Hygiene: Standing Equipment Used: Gait belt Transfers/Ambulation Related to ADLs: min guard assist to ambulate to BR pushing IV ple ADL Comments: Pt fatigues - requires rest break after ~15 mins of activity     OT Diagnosis:    OT Problem List:   OT Treatment Interventions:     OT  Goals(current goals can now be found in the care plan section) ADL Goals Pt Will Perform Grooming: standing;with modified independence Pt Will Perform Lower Body Bathing: with modified independence;sit to/from stand Pt Will Perform Lower Body Dressing: with modified independence;sit to/from stand Pt Will Transfer to Toilet: with modified independence;ambulating Pt Will Perform Toileting - Clothing Manipulation and hygiene: with modified independence;sit to/from stand Pt Will Perform Tub/Shower Transfer: with modified independence;ambulating  Visit Information  Last OT Received On: 02/21/13 Assistance Needed: +1 History of Present Illness: Pt admitted with AMS and hypoxia.  Was undergoing radiation for laryngeal carcinoma.  Now with trach, dysphagia (plan for PEG tube), new ESRD, afib with RVR.      Subjective Data      Prior Functioning       Cognition  Cognition Arousal/Alertness: Awake/alert Behavior During Therapy: WFL for tasks assessed/performed Overall Cognitive Status: No family/caregiver present to determine baseline cognitive functioning (Pt repeating questions multiple times)    Mobility  Bed Mobility Bed Mobility: Supine to Sit;Sitting - Scoot to Edge of Bed;Sit to Supine Supine to Sit: 5: Supervision Sitting - Scoot to Edge of Bed: 5: Supervision Sit to Supine: 5: Supervision Details for Bed Mobility Assistance: pt on BSC on arrival and recliner end of session Transfers Transfers: Sit to Stand;Stand to Sit Sit to Stand: 4: Min guard;With upper extremity assist;From bed;From toilet Stand to Sit: 4: Min guard;With upper extremity assist;To bed;To toilet Details for Transfer Assistance: verbal cues for safety.  Pt with LOB when standing from toilet    Exercises  General Exercises - Upper Extremity Shoulder Flexion: AROM;Right;Left;Seated;20 reps General Exercises - Lower Extremity  Long Arc Quad: AROM;Both;20 reps;Seated Hip ABduction/ADduction: AROM;Both;20  reps;Seated Hip Flexion/Marching: AROM;Both;20 reps;Seated Toe Raises: AROM;Both;20 reps;Seated Heel Raises: AROM;Seated;Both;20 reps   Balance Balance Balance Assessed: Yes Dynamic Standing Balance Dynamic Standing - Balance Support: No upper extremity supported Dynamic Standing - Level of Assistance: 5: Stand by assistance Dynamic Standing - Balance Activities: Other (comment) (ADLs) Dynamic Standing - Comments: Pt with LOB, but recovered without assist   End of Session OT - End of Session Activity Tolerance: Patient limited by fatigue Patient left: in bed;with call bell/phone within reach;Other (comment) (MD and RT in for trach change) Nurse Communication: Mobility status  GO     Rainn Bullinger, Ursula Alert M 02/21/2013, 3:08 PM

## 2013-02-21 NOTE — Discharge Summary (Signed)
Physician Discharge Summary     Patient ID: Victor Lane MRN: [id number] DOB/AGE: 01-22-1946 67 y.o.  Admit date: 02/10/2013 Discharge date: 02/25/2013  Discharge Diagnoses:  Acute respiratory failure due  to aspiration pneumonia and pulmonary edema Laryngeal cancer  Trach status  PAF with RVR >> NSR  Hx of HTN.  ESRD on HD.  Hyperkalemia.  Dysphagia.  Hx of GERD.  Diarrhea >> improving.  S/P G tube 8/29.  Anemia of critical illness and chronic disease.  Hx of laryngeal cancer s/p XRT.  HCAP, aspiration  DM-2  Encephalopathy d/t uremia and hypoxemia, resolved.  Pain  Deconditioning. 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 Jearld Fenton, final pathology report 01/04/2013) currently undergoing radiation therapy under Dr. Basilio Cairo care, presented  to [facility] emergency department 8/22 after found to be more lethargic at home  by his wife. In ER pt explained he had felt progressively weaker over the past several days and unable to ambulate, went to bed the night before and wife was unable to wake him  up that morning. Wife also explained patient has been more confused over the past several days. Patient also reported progressively worsening shortness of breath and feeling that he cannot take full deep breath, worse with exertion and somewhat improved with rest, 2-3 pillow orthopnea.He was first diagnosed with kidney disease about 7 years ago but has never had any workup done. In the emergency department, patient found to be hypoxic with oxygen saturation in the 80s, improved with 4 L of nasal cannula. Electrolyte panel significant for hyperkalemia K = 7.0,  creatinine equal 11.22. TRH asked to admit to [facility] hospital for further evaluation and management and due to hypoxia, SDU bed requested.   He was transferred to [facility] on 8/22 for HD and Permacath placement and emergent HD. PCCM was asked to see for progressive respiratory failure which required NIPPV. He received Emergent HD, also was placed on cycling NIPPV and oxygen, as well as empiric antibiotics to cover for possible aspiration pneumonia. On 8/24 he had acute worsening agitation and hypoxia. This did not improve with NIPPV and he was therefore intubated. He was noted to be difficult intubation due to his active anatomical changes from radiation and his cancer. He was supported on full vent support using low tidal volume ventilation. ENT was consulted and recommended tracheostomy. A size 8 cuffed shiley was placed on 8/26. On 8/26 we were able to start weaning efforts. We were able to d/c vent from room by 8/27 and continue ATC only. FEES study was performed on 8/27 and showed:  Severe pharyngeal dysphagia w/ noted aspiration even with honey thick consistency liquids.   On 8/28 he had been moved to the SDU setting. His encephalopathy had resolved, he was off the vent 24/7. Afib w/ RVR which had occurred while acutely ill had resolved and he was in NSR, and he had completed his full course to therapy of antibiotics for aspiration PNA. Rehab services including PT/OT and SLp were following. He continued to fail dysphagia screening and PEG was recommended. PEG tube was placed on 8/29. Later that day on 8/29 he was seen by vascular surgery for permament HD access/ fistula. Vein mapping  was completed on 8/31. On 9/2 Nephrology raised concern about trach status and eligibility to go to dialysis center. Said he would have to be able to have trach capped during HD treatments in order to be eligible (due to infection control policy). Because of this we decreased his trach from a 8 cuffless to 6 cuffless shiley and  started red-cap trials. He had repeat SLP eval and MBS completed on 9/2 which showed marked improvement in swallowing function and a dysphagia 3/ mechanically soft diet was started w/ thin liquids as a trial. He had AVF scheduled for 9/3 and this was completed. Post-op had almost 1 gm hgb drift. Vascular followed for small hematoma. He did receive 2 units of blood on 9/5. He continued to improve over the following days and was deemed stable for discharge.   At time of discharge he has his trach capped during the day, all day, and he is tolerating room air 24 hours a day.  He will go home  with and continuing PT/OT. He is getting nutrition orally and is able to supplement via PEG if needed. He is undergoing scheduled HD treatments. He will be discharged to home with the following plan of care outlined below.    Discharge Plan by active diagnoses  Laryngeal cancer  Trach status -->not a candidate for decannulation. He is on room air/ capped all day long.  P:  continue trach collar night and red-cap during day-time  Cont BDs  Will F/U with Dr [name] re: his cancer  Trach clinic f/u on: Oct 7  PAF with RVR >> NSR  Hx of HTN.  P:  Cont metoprolol  ESRD on HD.  S/p AVF P:  F/u with nephrology for routine HD F/u with vascular    Dysphagia. S/P G tube 8/29. Hx of GERD.  P:  Dysphagia 3/soft/pureed consistency  Thin liquids.  Should supplement Nepro TID, can do this either via tube OR orally Flush PEG tube TID w/ 30ml tap water SLP f/u in out-pt rehab  Lomotil PRN   Anemia of critical illness and chronic disease.  Had hgb drift s/p AVG placement.  P:  -aranesp per renal  DM-2  P:  Hold off on resuming rx. Glucose well controlled w/ diet only    Significant Hospital tests/ studies/ interventions and procedures  Consults: nephrology, vascular surgery  SIGNIFICANT EVENTS:  01/04/13 microlaryngoscopy - Jearld Fenton  8/22 Start HD, PCCM consulted for dyspnea, SVT  8/24 Failed BiPAP >>  VDRF, ENT consulted  8/26 Perc trach by DF >> #8 trach  8/27 Off vent, transfer to telemetry  8/29 G tube per IR  9/2: repeats swallow eval (MBS), started dysphagia 3 diet w/ clears 9/3: AVG placed  STUDIES:  LINES / TUBES:  Rt IJ permcath (IR) 8/22 >>  Lt IJ CVL 8/24 >> 8/28  ETT 8/24 >> 8/26  Trach (DF) 8/26 >> changed to 6 cuffless 9/2>>>  CULTURES:  BCx2 8/22 >> neg  Trach asp 8/24>>oral flora  ANTIBIOTICS:  Zithromax 8/22 >> 8/22  Aztreonam 8/22 >> 8/22  Ancef 8/22 >> 8/22  Rocephin 8/22 >> 8/22  Vancomycin 8/22 >> 8/27  Cefepime 8/23 >> 8/28    Discharge Exam: BP 108/55  Pulse 95  Temp(Src) 98.7 F (37.1 C) (Oral)  Resp 16  Ht 5\' 10"  (1.778 m)  Wt 81.602 kg (179 lb 14.4 oz)  BMI 25.81 kg/m2  SpO2 95%  PHYSICAL EXAMINATION:  General: No distress  Neuro: Alert, normal strength, follows commands  HEENT: trach site clean. Speaking clearly  Cardiovascular: regular, no murmur  Lungs: few crackles  Abdomen: New G tube  Musculoskeletal: No edema  Skin: No rash   Labs at discharge Lab Results  Component Value Date   CREATININE 5.94* 02/25/2013   BUN 33* 02/25/2013   NA 134* 02/25/2013   K 3.9 02/25/2013   CL 93* 02/25/2013   CO2 26 02/25/2013   Lab Results  Component Value Date   WBC 8.0 02/25/2013   HGB 8.5* 02/25/2013   HCT 25.8* 02/25/2013   MCV 90.5 02/25/2013   PLT 208 02/25/2013   Lab Results  Component Value Date   ALT 10 02/13/2013   AST 58* 02/13/2013   ALKPHOS 60 02/13/2013   BILITOT 0.2* 02/13/2013   Lab Results  Component Value Date   INR 1.10 02/13/2013   INR 0.96 03/27/2011    Current radiology studies No results found.  Disposition:  01-Home or Self Care      Discharge Orders   Future Appointments Provider Department Dept Phone   02/27/2013 5:00 PM [facility] [phone]   02/28/2013 1:40 PM [facility] RAD[facility]COL[facility] [phone]   03/01/2013 4:45 PM  [facility]COL[facility] [phone]   03/02/2013 1:10 PM [facility]COL[facility] [phone]   03/03/2013 4:45 PM [facility] RAD[facility]COL[facility] [phone]   03/06/2013 4:45 PM [facility] RAD[facility]COL[facility] [phone]   03/07/2013 4:45 PM [facility]COL[facility] [phone]   03/08/2013 4:45 PM [facility] RAD[facility]COL[facility] [phone]   03/09/2013 3:00 PM [facility] RAD[facility]COL[facility] [phone]   03/10/2013 3:00 PM [facility]COL[facility] [phone]   03/13/2013 2:55 PM [facility] RAD[facility]COL[facility] [phone]   03/14/2013 2:45 PM [facility]COL[facility] [phone]   03/24/2013 2:30 PM [facility] [phone]   03/24/2013 3:30 PM Fransisco Hertz, MD [facility] [phone]   03/24/2013 4:00 PM [facility]cho [facility] 1 [facility] SITE 3 ECHO LAB [phone]   03/28/2013 2:30 PM [facility] [phone]   Future Orders Complete By Expires   Diet - low sodium heart healthy  As directed    Discharge instructions  As directed    Comments:     Dialysis set up for 02/27/13 at 1030   Increase activity slowly  As directed        Medication List    STOP taking these medications       amLODipine 10 MG tablet  Commonly known as:  NORVASC     calcium carbonate 1250 MG capsule     doxazosin 2 MG tablet  Commonly known as:  CARDURA     furosemide 40 MG tablet  Commonly known as:  LASIX     glipiZIDE 2.5 MG 24 hr tablet  Commonly known as:  GLUCOTROL XL     morphine 20 MG/ML  concentrated solution  Commonly known as:  ROXANOL      TAKE these medications       aspirin 81 MG tablet  Take 81 mg by mouth daily.     b  complex vitamins capsule  Take 1 capsule by mouth daily.     calcium acetate 667 MG capsule  Commonly known as:  PHOSLO  Take 1 capsule (667 mg total) by mouth 3 (three) times daily with meals.     esomeprazole 40 MG capsule  Commonly known as:  NEXIUM  Take 40 mg by mouth daily before breakfast.     fentaNYL 25 MCG/HR patch  Commonly known as:  DURAGESIC - dosed mcg/hr  Place 1 patch onto the skin every 3 (three) days. Last patch was applied 8/19 at 1600     Fish Oil 1200 MG Cpdr  Take 2,400 mg by mouth.     magic mouthwash w/lidocaine Soln  1part nystatin,1part Maaloxplus,1part benadryl,3part 2%viscous lidocaine. Swallow 10 mL up to QID, before meals/bedtime     magnesium oxide 400 MG tablet  Commonly known as:  MAG-OX  Take 400 mg by mouth daily.     metoprolol tartrate 25 MG tablet  Commonly known as:  LOPRESSOR  Take 0.5 tablets (12.5 mg total) by mouth 2 (two) times daily.     ranitidine 300 MG capsule  Commonly known as:  ZANTAC  Take 300 mg by mouth at bedtime.     traMADol 50 MG tablet  Commonly known as:  ULTRAM  Take 100 mg by mouth every 4 (four) hours as needed.     venlafaxine 37.5 MG tablet  Commonly known as:  EFFEXOR  Take 37.5 mg by mouth 2 (two) times daily.     Vitamin D (Ergocalciferol) 50000 UNITS Caps capsule  Commonly known as:  DRISDOL  Take 50,000 Units by mouth every 14 (fourteen) days. Next dose is due august 30th      ASK your doctor about these medications       sodium bicarbonate 325 MG tablet  Take 325 mg by mouth 3 (three) times daily.       Follow-up Information   Follow up with Nilda Simmer, MD In 6 weeks. (Office will call you to arrange your appt (sent))    Specialty:  Vascular Surgery   Contact information:   [address] [place] [address] [phone]         Follow up with [facility] On 03/28/2013. (at 230pm. Will need to arrive at admissions for check-in at 215 for processing )    Specialty:  Respiratory Therapy   Contact information:   [address] [place] [id number] [place] [address] [phone]      Follow up with [facility] dialysis center  On 02/27/2013. (1030am, plan on this being a long appointment as will have paper work prior to HD )       Discharged Condition: improved   Physician Statement:   The Patient was personally examined, the discharge assessment and plan has been personally reviewed and I agree with ACNP Babcock's assessment and plan. > 30 minutes of time have been dedicated to discharge assessment, planning and discharge instructions.   SignedRubye Oaks NP Sidonie Dickens PCCM  [phone]  02/25/2013, 2:37 PM

## 2013-02-22 ENCOUNTER — Telehealth: Payer: Self-pay | Admitting: Dietician

## 2013-02-22 ENCOUNTER — Ambulatory Visit
Admission: RE | Admit: 2013-02-22 | Discharge: 2013-02-22 | Disposition: A | Discharge status: Home / Self Care | Payer: Medicare Other | Source: Ambulatory Visit | Attending: Radiation Oncology | Admitting: Radiation Oncology

## 2013-02-22 LAB — BASIC METABOLIC PANEL
Chloride: 94 mEq/L — ABNORMAL LOW (ref 96–112)
Creatinine, Ser: 8.55 mg/dL — ABNORMAL HIGH (ref 0.50–1.35)
GFR calc Af Amer: 7 mL/min — ABNORMAL LOW (ref >90)
Potassium: 4 mEq/L (ref 3.5–5.1)

## 2013-02-22 LAB — RENAL FUNCTION PANEL
Albumin: 2.1 g/dL — ABNORMAL LOW (ref 3.5–5.2)
BUN: 74 mg/dL — ABNORMAL HIGH (ref 6–23)
Chloride: 92 mEq/L — ABNORMAL LOW (ref 96–112)
Creatinine, Ser: 9.14 mg/dL — ABNORMAL HIGH (ref 0.50–1.35)

## 2013-02-22 LAB — CBC
Platelets: 277 10*3/uL (ref 150–400)
RDW: 15.4 % (ref 11.5–15.5)
WBC: 7.2 10*3/uL (ref 4.0–10.5)

## 2013-02-22 LAB — GLUCOSE, CAPILLARY
Glucose-Capillary: 123 mg/dL — ABNORMAL HIGH (ref 70–99)
Glucose-Capillary: 124 mg/dL — ABNORMAL HIGH (ref 70–99)

## 2013-02-22 MED ORDER — PENTAFLUOROPROP-TETRAFLUOROETH EX AERO: 1.0000 "application " | INHALATION_SPRAY | CUTANEOUS | Status: DC | PRN

## 2013-02-22 MED ORDER — PANTOPRAZOLE SODIUM 40 MG PO PACK
40.0000 mg | PACK | Freq: Every day | ORAL | Status: DC
  Administered 2013-02-22 – 2013-02-24 (×2): 40 mg
  Filled 2013-02-22 (×4): qty 20

## 2013-02-22 MED ORDER — SODIUM CHLORIDE 0.9 % IV SOLN: 100.0000 mL | INTRAVENOUS | Status: DC | PRN

## 2013-02-22 MED ORDER — NEPRO/CARBSTEADY PO LIQD: 237.0000 mL | ORAL | Status: DC | PRN

## 2013-02-22 MED ORDER — HEPARIN SODIUM (PORCINE) 1000 UNIT/ML DIALYSIS: 1000.0000 [IU] | INTRAMUSCULAR | Status: DC | PRN

## 2013-02-22 MED ORDER — ALTEPLASE 2 MG IJ SOLR: 2.0000 mg | Freq: Once | INTRAMUSCULAR | Status: AC | PRN

## 2013-02-22 MED ORDER — LIDOCAINE-PRILOCAINE 2.5-2.5 % EX CREA
1.0000 | TOPICAL_CREAM | CUTANEOUS | Status: DC | PRN
Start: 2013-02-22 — End: 2013-02-25

## 2013-02-22 MED ORDER — VANCOMYCIN HCL IN DEXTROSE 1-5 GM/200ML-% IV SOLN
1000.0000 mg | INTRAVENOUS | Status: AC
  Administered 2013-02-23: 1000 mg via INTRAVENOUS
  Filled 2013-02-22: qty 200

## 2013-02-22 MED ORDER — LIDOCAINE HCL (PF) 1 % IJ SOLN: 5.0000 mL | INTRAMUSCULAR | Status: DC | PRN

## 2013-02-22 NOTE — Progress Notes (Signed)
Speech Language Pathology Dysphagia Treatment Patient Details Name: Victor Lane MRN: [id number] DOB: Sep 19, 1945 Today's Date: 02/22/2013 Time: 4098-1191 SLP Time Calculation (min): 29 min  Assessment / Plan / Recommendation Clinical Impression  Pt seen for f/u to assess diet tolerance after recommendation to advance diet after repeat objective testing 9/2. Pt observed with Dys. 3 textures and thin liquids via cup sips. Intermittent, strong cough response noted before, during (delayed), and after PO intake. Pt requried Min cues to use compensatory strategies. Sign was placed in front of pt as an additional reminder. Thorough education was provided to patient and wife regarding swallowing and post-radiation changes. Recommend that pt continue to receive SLP intervention during acute care stay and after return home throughout the remainder of his radiation treatment.    Diet Recommendation  Continue with Current Diet: Dysphagia 3 (mechanical soft);Thin liquid    SLP Plan Continue with current plan of care   Pertinent Vitals/Pain N/A   Swallowing Goals  SLP Swallowing Goals Patient will consume recommended diet without observed clinical signs of aspiration with: Supervision/safety Swallow Study Goal #1 - Progress: Progressing toward goal Patient will utilize recommended strategies during swallow to increase swallowing safety with: Supervision/safety Swallow Study Goal #2 - Progress: Progressing toward goal  General Temperature Spikes Noted: No Respiratory Status: Other (comment) (trach capped) Behavior/Cognition: Alert;Cooperative;Pleasant mood Oral Cavity - Dentition: Adequate natural dentition Patient Positioning: Upright in bed  Oral Cavity - Oral Hygiene Does patient have any of the following "at risk" factors?: Other - dysphagia Brush patient's teeth BID with toothbrush (using toothpaste with fluoride): Yes Patient is HIGH RISK - Oral Care Protocol followed (see row info):  Yes Patient is AT RISK - Oral Care Protocol followed (see row info): Yes   Dysphagia Treatment Treatment focused on: Skilled observation of diet tolerance;Patient/family/caregiver education;Utilization of compensatory strategies Family/Caregiver Educated: wife Treatment Methods/Modalities: Skilled observation Patient observed directly with PO's: Yes Type of PO's observed: Dysphagia 3 (soft);Thin liquids Feeding: Able to feed self Liquids provided via: Cup;No straw Pharyngeal Phase Signs & Symptoms: Delayed cough Type of cueing: Verbal;Visual Amount of cueing: Minimal   GO     Maxcine Ham 02/22/2013, 3:35 PM  Maxcine Ham, M.A. CCC-SLP

## 2013-02-22 NOTE — Progress Notes (Signed)
[facility] [place]  Pt's tube feeds were not discontinued in time for the OR.  Will reschedule case for tomorrow.  Please keep patient NPO after midnight.  Leonides Sake, MD [facility] [place] Office: [phone] Pager: [phone]  02/22/2013, 12:09 PM

## 2013-02-22 NOTE — Progress Notes (Signed)
PT Cancellation Note  Patient Details Name: Victor Lane MRN: [id number] DOB: 17-Apr-1946   Cancelled Treatment:    Reason Eval/Treat Not Completed: Patient at procedure or test/unavailable (with speech for testing, will follow up after sx)   Fabio Asa 02/22/2013, 3:39 PM

## 2013-02-22 NOTE — Procedures (Signed)
I have seen and examined this patient and agree with the plan of care. Patient scheduled for AVF today. Seen on dialysis with no complaints.  Victor Lane W 02/22/2013, 6:55 AM

## 2013-02-22 NOTE — Progress Notes (Signed)
   Weekly Management Note:  Inpatient Current Dose:  31.5 Gy  Projected Dose: 63 Gy   Narrative:  The patient presents for routine under treatment assessment.  CBCT/MVCT images/Port film x-rays were reviewed.  The chart was checked. He is hoping to be discharged on Friday.  Feels very cold.  Resumed RT to larynx today (patient declined transport by [facility] yesterday).  Physical Findings:  height is 5\' 10"  (1.778 m) and weight is 180 lb 1.9 oz (81.7 kg). His oral temperature is 97.7 F (36.5 C). His blood pressure is 103/67 and his pulse is 105. His respiration is 17 and oxygen saturation is 100%.  NAD, hoarse but voice slightly improved.  Trach in place.  Impression:  The patient is tolerating radiotherapy.  Plan:  Continue radiotherapy as planned.   ________________________________   Lonie Peak, M.D.

## 2013-02-22 NOTE — Progress Notes (Signed)
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 Jearld Fenton) ,undergoing RT [name]) adm to [facility] 8/22 with confusion, dyspnea, hypoxia, hyperkalemia & AKI with cr 11/ BUN 101 - transferred to [facility] for rt IJ permacath placement then , emergent HD. PCCM consulted 8/22 pm for worsening dyspnea while on HD requiring bipap.  SIGNIFICANT EVENTS: 01/04/13 microlaryngoscopy - Jearld Fenton 8/22 Start HD, PCCM consulted for dyspnea, SVT 8/24 Failed BiPAP >> VDRF, ENT consulted 8/26 Perc trach by DF >> #8 trach 8/27 Off vent, transfer to telemetry 8/29 G tube per IR 9/2: MBS: improved dysphagia, dysphagia 3 diet w/ clears started.  9/3: AVG placed   STUDIES:    LINES / TUBES: Rt IJ permcath (IR) 8/22 >> Lt IJ CVL 8/24 >> 8/28 ETT 8/24 >> 8/26 Trach (DF) 8/26 >>  CULTURES: BCx2 8/22 >> Trach asp 8/24>>oral flora  ANTIBIOTICS: Zithromax 8/22 >> 8/22 Aztreonam 8/22 >> 8/22 Ancef 8/22 >> 8/22 Rocephin 8/22 >> 8/22 Vancomycin 8/22 >> 8/27 Cefepime 8/23 >> 8/28  SUBJECTIVE:  Anxious, not getting sleep   VITAL SIGNS: Temp:  [97.1 F (36.2 C)-99.5 F (37.5 C)] 97.1 F (36.2 C) (09/03 1010) Pulse Rate:  [64-105] 104 (09/03 1010) Resp:  [16-20] 20 (09/03 1010) BP: (94-136)/(44-73) 110/58 mmHg (09/03 1010) SpO2:  [92 %-97 %] 94 % (09/03 1010) Weight:  [81.7 kg (180 lb 1.9 oz)-82.7 kg (182 lb 5.1 oz)] 81.7 kg (180 lb 1.9 oz) (09/03 1010)  INTAKE / OUTPUT: Intake/Output     09/02 0701 - 09/03 0700 09/03 0701 - 09/04 0700   P.O. 480    Other     NG/GT     Total Intake(mL/kg) 480 (5.8)    Other  2200   Stool 1    Total Output 1 2200   Net +479 -2200        Stool Occurrence 1 x       PHYSICAL EXAMINATION: General: No distress Neuro: Alert, normal strength, follows commands HEENT: trach site clean. Speaking clearly Cardiovascular: regular, no murmur Lungs: clear  Abdomen:  New G tube Musculoskeletal:  No edema Skin:  No rash  LABS:  CBC Recent Labs     02/20/13  0654  02/22/13  0524  WBC  12.7*  7.2  HGB  8.1*  7.5*  HCT  23.8*  21.7*  PLT  279  277   Coag's No results found for this basename: APTT, INR,  in the last 72 hours  BMET Recent Labs     02/20/13  0654  02/22/13  0524  02/22/13  0645  NA  135  137  133*  K  4.1  4.0  3.4*  CL  89*  94*  92*  CO2  25  23  23   BUN  98*  75*  74*  CREATININE  10.05*  8.55*  9.14*  GLUCOSE  115*  129*  137*   Electrolytes Recent Labs     02/20/13  0654  02/22/13  0524  02/22/13  0645  CALCIUM  7.6*  7.2*  7.2*  PHOS  10.0*   --   8.8*   ABG No results found for this basename: PHART, PCO2ART, PO2ART,  in the last 72 hours Liver Enzymes Recent Labs     02/20/13  0654  02/22/13  0645  ALBUMIN  2.2*  2.1*   Glucose Recent Labs     02/20/13  2359  02/21/13  0450  02/21/13  0811  02/21/13  1156  02/21/13  2052  02/22/13  0044  GLUCAP  100*  116*  103*  115*  129*  124*    Recent Labs Lab 02/16/13 0400 02/17/13 0708 02/20/13 0654 02/21/13 1600 02/22/13 0524  PROCALCITON  --   --   --  2.23 1.82  WBC 9.4 10.9* 12.7*  --  7.2    CXR: 9/2: negative  ASSESSMENT / PLAN:  Laryngeal cancer Trach status-->down sixed to 6 on 9/2 P:   Cont red capping trial  Dysphagia precautions Goal to restart XRT asap, although acknowledge this will potentially stall swallowing progress. We may need to balance these goals.   Chills.--> CBC and PCT are reassuring  P: Not intervention  PAF with RVR >> NSR Hx of HTN. P:  Cont metoprolol-->change to po   ESRD on HD. AVG 9/3 P:   -HD per renal-. Chronic -HD center will be OK as long as he can have trach capped.  -condom cath      Dysphagia-->had MBS on 9/2, w/ marked improvement.  Hx of GERD. Diarrhea -->added lomotil,  S/P G tube 8/29.   P:   Dysphagia 3 diet w/ clear liquid Change tube feeds to nocturnal (likely) Will need out-pt SLP   Anemia of critical illness and chronic disease. P:  -f/u CBC -transfuse for Hb < 7 -aranesp per renal -SQ heparin for DVT prevention  DM-2   CBG (last 3)   Recent Labs  02/21/13 1156 02/21/13 2052 02/22/13 0044  GLUCAP 115* 129* 124*   P:   Holding glyburide Resume SSI for glu > 180  Anxiety -->can't sleep, added low dose xanax PRN 9/2-->  Pain-->tolerable, mostly just annoyed by abd brace  Deconditioning-->working w/ PT P:   low dose xanax Cont PT Discharge planning   Levy Pupa, MD, PhD 02/22/2013, 12:03 PM [place] [facility] [phone] or if no answer [phone]

## 2013-02-23 ENCOUNTER — Ambulatory Visit
Admission: RE | Admit: 2013-02-23 | Discharge: 2013-02-23 | Disposition: A | Discharge status: Home / Self Care | Payer: Medicare Other | Source: Ambulatory Visit | Attending: Radiation Oncology | Admitting: Radiation Oncology

## 2013-02-23 ENCOUNTER — Telehealth: Payer: Self-pay | Admitting: Vascular Surgery

## 2013-02-23 ENCOUNTER — Other Ambulatory Visit: Payer: Self-pay | Admitting: *Deleted

## 2013-02-23 ENCOUNTER — Encounter (HOSPITAL_COMMUNITY): Payer: Self-pay | Admitting: Anesthesiology

## 2013-02-23 ENCOUNTER — Inpatient Hospital Stay (HOSPITAL_COMMUNITY): Payer: Medicare Other | Admitting: Anesthesiology

## 2013-02-23 ENCOUNTER — Encounter (HOSPITAL_COMMUNITY)
Admission: EM | Disposition: A | Discharge status: Home Health | Payer: Self-pay | Source: Home / Self Care | Attending: Critical Care Medicine

## 2013-02-23 DIAGNOSIS — Z930 Tracheostomy status: Secondary | ICD-10-CM

## 2013-02-23 DIAGNOSIS — N186 End stage renal disease: Secondary | ICD-10-CM

## 2013-02-23 DIAGNOSIS — Z4931 Encounter for adequacy testing for hemodialysis: Secondary | ICD-10-CM

## 2013-02-23 HISTORY — PX: AV FISTULA PLACEMENT: SHX1204

## 2013-02-23 LAB — CBC
HCT: 22.4 % — ABNORMAL LOW (ref 39.0–52.0)
MCV: 91.8 fL (ref 78.0–100.0)
RBC: 2.44 MIL/uL — ABNORMAL LOW (ref 4.22–5.81)
WBC: 5.9 10*3/uL (ref 4.0–10.5)

## 2013-02-23 LAB — BASIC METABOLIC PANEL
CO2: 26 mEq/L (ref 19–32)
Chloride: 100 mEq/L (ref 96–112)
Creatinine, Ser: 6.29 mg/dL — ABNORMAL HIGH (ref 0.50–1.35)
Sodium: 140 mEq/L (ref 135–145)

## 2013-02-23 LAB — GLUCOSE, CAPILLARY
Glucose-Capillary: 109 mg/dL — ABNORMAL HIGH (ref 70–99)
Glucose-Capillary: 113 mg/dL — ABNORMAL HIGH (ref 70–99)
Glucose-Capillary: 90 mg/dL (ref 70–99)
Glucose-Capillary: 90 mg/dL (ref 70–99)

## 2013-02-23 SURGERY — ARTERIOVENOUS (AV) FISTULA CREATION
Anesthesia: Monitor Anesthesia Care | Site: Arm Upper | Laterality: Left | Wound class: Clean

## 2013-02-23 MED ORDER — THROMBIN 20000 UNITS EX SOLR
CUTANEOUS | Status: AC
  Filled 2013-02-23: qty 20000

## 2013-02-23 MED ORDER — PROPOFOL INFUSION 10 MG/ML OPTIME
INTRAVENOUS | Status: DC | PRN
  Administered 2013-02-23: 25 ug/kg/min via INTRAVENOUS

## 2013-02-23 MED ORDER — SODIUM CHLORIDE 0.9 % IR SOLN
Status: DC | PRN
  Administered 2013-02-23: 11:00:00

## 2013-02-23 MED ORDER — FENTANYL CITRATE 0.05 MG/ML IJ SOLN: 25.0000 ug | INTRAMUSCULAR | Status: DC | PRN

## 2013-02-23 MED ORDER — BUPIVACAINE HCL (PF) 0.5 % IJ SOLN
INTRAMUSCULAR | Status: AC
  Filled 2013-02-23: qty 30

## 2013-02-23 MED ORDER — PRO-STAT SUGAR FREE PO LIQD
30.0000 mL | Freq: Two times a day (BID) | ORAL | Status: DC
  Filled 2013-02-23 (×2): qty 30

## 2013-02-23 MED ORDER — SODIUM CHLORIDE 0.9 % IV SOLN
INTRAVENOUS | Status: DC
  Administered 2013-02-23: 10 mL/h via INTRAVENOUS

## 2013-02-23 MED ORDER — NEPRO/CARBSTEADY PO LIQD
237.0000 mL | Freq: Three times a day (TID) | ORAL | Status: DC
  Administered 2013-02-23 – 2013-02-25 (×6): 237 mL
  Filled 2013-02-23 (×10): qty 237

## 2013-02-23 MED ORDER — MIDAZOLAM HCL 5 MG/5ML IJ SOLN
INTRAMUSCULAR | Status: DC | PRN
  Administered 2013-02-23 (×2): 1 mg via INTRAVENOUS

## 2013-02-23 MED ORDER — LIDOCAINE-EPINEPHRINE (PF) 1 %-1:200000 IJ SOLN
INTRAMUSCULAR | Status: DC | PRN
  Administered 2013-02-23: 1 mL

## 2013-02-23 MED ORDER — SODIUM CHLORIDE 0.9 % IV SOLN
INTRAVENOUS | Status: DC | PRN
  Administered 2013-02-23: 10:00:00 via INTRAVENOUS

## 2013-02-23 MED ORDER — 0.9 % SODIUM CHLORIDE (POUR BTL) OPTIME
TOPICAL | Status: DC | PRN
  Administered 2013-02-23: 1000 mL

## 2013-02-23 MED ORDER — NEPRO/CARBSTEADY PO LIQD
1000.0000 mL | ORAL | Status: DC
Start: 2013-02-23 — End: 2013-02-23
  Filled 2013-02-23: qty 1000

## 2013-02-23 MED ORDER — FENTANYL CITRATE 0.05 MG/ML IJ SOLN
INTRAMUSCULAR | Status: DC | PRN
  Administered 2013-02-23 (×2): 50 ug via INTRAVENOUS
  Administered 2013-02-23: 25 ug via INTRAVENOUS

## 2013-02-23 MED ORDER — BUPIVACAINE HCL (PF) 0.5 % IJ SOLN
INTRAMUSCULAR | Status: DC | PRN
  Administered 2013-02-23: 1 mL

## 2013-02-23 MED ORDER — LACTATED RINGERS IV SOLN: INTRAVENOUS | Status: DC

## 2013-02-23 MED ORDER — DEXTROSE 5 % IV SOLN
10.0000 mg | INTRAVENOUS | Status: DC | PRN
  Administered 2013-02-23: 50 ug/min via INTRAVENOUS

## 2013-02-23 MED ORDER — PROPOFOL 10 MG/ML IV BOLUS
INTRAVENOUS | Status: DC | PRN
  Administered 2013-02-23: 20 mg via INTRAVENOUS
  Administered 2013-02-23: 40 mg via INTRAVENOUS
  Administered 2013-02-23: 20 mg via INTRAVENOUS

## 2013-02-23 MED ORDER — METOCLOPRAMIDE HCL 5 MG/ML IJ SOLN: 10.0000 mg | Freq: Once | INTRAMUSCULAR | Status: DC | PRN

## 2013-02-23 MED ORDER — LIDOCAINE-EPINEPHRINE (PF) 1 %-1:200000 IJ SOLN
INTRAMUSCULAR | Status: AC
  Filled 2013-02-23: qty 10

## 2013-02-23 SURGICAL SUPPLY — 41 items
ADH SKN CLS APL DERMABOND .7 (GAUZE/BANDAGES/DRESSINGS) ×1
ADH SKN CLS LQ APL DERMABOND (GAUZE/BANDAGES/DRESSINGS) ×1
ARMBAND PINK RESTRICT EXTREMIT (MISCELLANEOUS) ×2 IMPLANT
CANISTER SUCTION 2500CC (MISCELLANEOUS) ×2 IMPLANT
CLIP TI MEDIUM 6 (CLIP) ×2 IMPLANT
CLIP TI WIDE RED SMALL 6 (CLIP) ×2 IMPLANT
CLOTH BEACON ORANGE TIMEOUT ST (SAFETY) ×2 IMPLANT
COVER PROBE W GEL 5X96 (DRAPES) ×1 IMPLANT
COVER SURGICAL LIGHT HANDLE (MISCELLANEOUS) ×2 IMPLANT
DECANTER SPIKE VIAL GLASS SM (MISCELLANEOUS) ×1 IMPLANT
DERMABOND ADHESIVE PROPEN (GAUZE/BANDAGES/DRESSINGS) ×1
DERMABOND ADVANCED (GAUZE/BANDAGES/DRESSINGS) ×1
DERMABOND ADVANCED .7 DNX12 (GAUZE/BANDAGES/DRESSINGS) ×1 IMPLANT
DERMABOND ADVANCED .7 DNX6 (GAUZE/BANDAGES/DRESSINGS) IMPLANT
ELECT REM PT RETURN 9FT ADLT (ELECTROSURGICAL) ×2
ELECTRODE REM PT RTRN 9FT ADLT (ELECTROSURGICAL) ×1 IMPLANT
GLOVE BIO SURGEON STRL SZ 6.5 (GLOVE) ×3 IMPLANT
GLOVE BIO SURGEON STRL SZ7 (GLOVE) ×2 IMPLANT
GLOVE BIOGEL PI IND STRL 7.0 (GLOVE) IMPLANT
GLOVE BIOGEL PI IND STRL 7.5 (GLOVE) ×1 IMPLANT
GLOVE BIOGEL PI INDICATOR 7.0 (GLOVE) ×1
GLOVE BIOGEL PI INDICATOR 7.5 (GLOVE) ×1
GLOVE SURG SS PI 6.5 STRL IVOR (GLOVE) ×1 IMPLANT
GOWN STRL NON-REIN LRG LVL3 (GOWN DISPOSABLE) ×6 IMPLANT
KIT BASIN OR (CUSTOM PROCEDURE TRAY) ×2 IMPLANT
KIT ROOM TURNOVER OR (KITS) ×2 IMPLANT
NEEDLE HYPO 25GX1X1/2 BEV (NEEDLE) ×2 IMPLANT
NS IRRIG 1000ML POUR BTL (IV SOLUTION) ×2 IMPLANT
PACK CV ACCESS (CUSTOM PROCEDURE TRAY) ×2 IMPLANT
PAD ARMBOARD 7.5X6 YLW CONV (MISCELLANEOUS) ×4 IMPLANT
SPONGE SURGIFOAM ABS GEL 100 (HEMOSTASIS) IMPLANT
SUT MNCRL AB 4-0 PS2 18 (SUTURE) ×1 IMPLANT
SUT PROLENE 6 0 BV (SUTURE) ×1 IMPLANT
SUT PROLENE 7 0 BV 1 (SUTURE) ×2 IMPLANT
SUT VIC AB 3-0 SH 27 (SUTURE) ×2
SUT VIC AB 3-0 SH 27X BRD (SUTURE) ×1 IMPLANT
SUT VIC AB 4-0 PS2 27 (SUTURE) ×2 IMPLANT
TOWEL OR 17X24 6PK STRL BLUE (TOWEL DISPOSABLE) ×3 IMPLANT
TOWEL OR 17X26 10 PK STRL BLUE (TOWEL DISPOSABLE) ×2 IMPLANT
UNDERPAD 30X30 INCONTINENT (UNDERPADS AND DIAPERS) ×2 IMPLANT
WATER STERILE IRR 1000ML POUR (IV SOLUTION) ×2 IMPLANT

## 2013-02-23 NOTE — Progress Notes (Signed)
Pt unavailable in radiation therapy x2 for trach check.

## 2013-02-23 NOTE — Progress Notes (Signed)
Trach education done at bedside with pt and wife. Dr. Delton Coombes called, clarified order: pt to be capped during day and humidified air QHS for discharge and continue at home. Will make a follow up appt at outpatient trach clinic [facility] post discharge. Pt and wife seemed to have a very good understanding with basic trach care.

## 2013-02-23 NOTE — Interval H&P Note (Signed)
History and Physical Interval Note:  02/23/2013 9:26 AM  Victor Lane  has presented today for surgery, with the diagnosis of ESRD  The various methods of treatment have been discussed with the patient and family. After consideration of risks, benefits and other options for treatment, the patient has consented to  Procedure(s): ARTERIOVENOUS (AV) FISTULA CREATION (Left) as a surgical intervention .  The patient's history has been reviewed, patient examined, no change in status, stable for surgery.  I have reviewed the patient's chart and labs.  Questions were answered to the patient's satisfaction.     Tan Clopper LIANG-YU

## 2013-02-23 NOTE — Progress Notes (Signed)
VASCULAR SURGERY  Pt was noted to have some swelling at his left arm incision after returning from [facility] for RTx. On exam, mild swelling at incision. Good thrill in AVF. Weak left radial pulse. Impression: small hematoma, which I do not think needs to be explored. I applied an Ace with gentle pressure. Would only consider exploration if it was enlarging.  Waverly Ferrari, MD, FACS Beeper [phone] 02/23/2013

## 2013-02-23 NOTE — Progress Notes (Addendum)
NUTRITION FOLLOW UP/CONSULT  Intervention:   1. D/c current TF regimen  2. Initiate Nepro 1 can TID via tube to provide 1275 kcal, 57 gm protein, and 516 ml free water daily (meeting 60% estimated kcal needs, 54% estimated protein needs). Provide 1/2 can at first scheduled bolus and advance to a whole can at second bolus if tolerated.    3. Pt should be abel to maintain hydration status with oral intake.    Nutrition Dx:   Inadequate oral intake related to inability to eat as evidenced by NPO status; ongoing.   Goal:   Pt to meet >/=90% estimated nutrition needs. Not met.   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 Per nephrology pt will be HD dependent from now on.  Currently pt at goal rate with tube feeding of Nepro @ 45. Spoke with RNCM who states pt plans for d/c tomorrow. Question regarding home TF regimen as pt is eating some. Pt resuming RT to larynx which may decrease ability to swallow. Had AV fistula placed today. Spoke with wife re: meal intake. States he is eating, and enjoys eating but is limited in quantity. Has been able to eat the meats and sometimes the bread but that is all. Discussed TF with wife who was in agreement with plan.   Per discussion with case manager, pt will not qualify for nocturnal feedings to be covered by insurance. Will change to a bolus regimen to assure tolerance prior to d/c. Pt will be able to buy Nepro over the counter in 8 oz cans.   Height: Ht Readings from Last 1 Encounters:  02/12/13 5\' 10"  (1.778 m)    Weight Status:   Wt Readings from Last 1 Encounters:  02/23/13 179 lb 0.2 oz (81.2 kg)  admission weight 194 lbs. Weight loss related to fluid status?    Re-estimated needs:  Kcal: 2100-2300  Protein: 105-115 grams  Fluid: >1.2 L/day  Skin: intact   Diet Order: NPO   Intake/Output Summary (Last 24 hours) at 02/23/13 1147 Last data filed at 02/23/13 1126  Gross per 24 hour  Intake    680 ml  Output      2 ml  Net    678 ml  Net -4.5 L this admission   Last BM: 9/3   Labs:   Recent Labs Lab 02/17/13 0708 02/20/13 0654 02/22/13 0524 02/22/13 0645 02/23/13 0605  NA 143 135 137 133* 140  K 3.8 4.1 4.0 3.4* 4.1  CL 103 89* 94* 92* 100  CO2 21 25 23 23 26   BUN 77* 98* 75* 74* 43*  CREATININE 7.40* 10.05* 8.55* 9.14* 6.29*  CALCIUM 8.3* 7.6* 7.2* 7.2* 7.7*  PHOS 5.7* 10.0*  --  8.8*  --   GLUCOSE 111* 115* 129* 137* 115*    CBG (last 3)   Recent Labs  02/23/13 0002 02/23/13 0438 02/23/13 0835  GLUCAP 113* 109* 109*    Scheduled Meds: . [facility] HOLD] acetaminophen  650 mg Oral Once  . [facility] HOLD] antiseptic oral rinse  15 mL Mouth Rinse QID  . [facility] HOLD] calcium acetate  667 mg Oral TID WC  . [facility] HOLD] chlorhexidine  15 mL Mouth Rinse  BID  . [[facility] HOLD] darbepoetin (ARANESP) injection - DIALYSIS  100 mcg Intravenous Q Mon-HD  . [facility] HOLD] feeding supplement  30 mL Per Tube BID  . [facility] HOLD] ferric gluconate (FERRLECIT/NULECIT) IV  125 mg Intravenous Q M,W,F-HD  . [facility] HOLD] free water  100 mL Per Tube Q8H  . [[facility] HOLD] heparin  5,000 Units Subcutaneous Q8H  . [facility] HOLD] metoprolol tartrate  12.5 mg Per Tube BID  . [MAR HOLD] multivitamin  1 tablet Oral QHS  . [facility] HOLD] pantoprazole sodium  40 mg Per Tube Daily  . [facility] HOLD] sodium chloride  3 mL Intravenous Q12H    Continuous Infusions: . sodium chloride 10 mL/hr (02/23/13 0839)  . feeding supplement (NEPRO CARB STEADY) 1,000 mL (02/22/13 1835)  . lactated ringers     Clarene Duke RD, LDN Pager [phone] After Hours pager [phone]

## 2013-02-23 NOTE — Anesthesia Procedure Notes (Signed)
Procedure Name: Intubation Date/Time: 02/23/2013 10:38 AM Performed by: Tyrone Nine Oxygen Delivery Method: Circle system utilized Intubation Type: Tracheostomy Tube size: 5.0 mm Number of attempts: 1 Tube secured with: Tape Comments: Attempted MAC sedation.  Pt continued to lift operative arm.  Dr. Gelene Mink in # 5.0 ETT thru trach after inner cannula removed.

## 2013-02-23 NOTE — Progress Notes (Signed)
PT CAME BACK TO ROOM FROM SURGERY. LT AC AV FISTULA PLACE. INCISION SITE WNL. PT THEN LEFT TO GO TO  [facility] FOR RADIATION TX APPROX. 1600, VIA CARELINK. PT BACK FROM [facility] AT 1730, AV FISTULA SITE NOTED TO BE SWOLLEN, RADIAL PULSE PALPABLE AND CAP REFILL FINE. CALLED DR. DICKSON MADE HIM AWARE. DR. WILL ASSESS PT. MONITORING WILL CONTINUE.

## 2013-02-23 NOTE — Telephone Encounter (Signed)
LVM re appointment information, sent letter - kf °

## 2013-02-23 NOTE — Anesthesia Preprocedure Evaluation (Addendum)
Anesthesia Evaluation  Patient identified by MRN, date of birth, ID band Patient awake    Reviewed: Allergy & Precautions, H&P , NPO status , Patient's Chart, lab work & pertinent test results, reviewed documented beta blocker date and time   History of Anesthesia Complications (+) Emergence Delirium  Airway Mallampati: II TM Distance: >3 FB Neck ROM: full    Dental   Pulmonary sleep apnea , pneumonia -, resolved,  breath sounds clear to auscultation  Pulmonary exam normal       Cardiovascular Exercise Tolerance: Good hypertension, Pt. on medications and On Home Beta Blockers + Peripheral Vascular Disease Rhythm:regular     Neuro/Psych PSYCHIATRIC DISORDERS Anxiety  Neuromuscular disease negative psych ROS   GI/Hepatic Neg liver ROS, GERD-  Medicated and Controlled,  Endo/Other  diabetes, Well Controlled, Type 2, Oral Hypoglycemic Agents  Renal/GU CRF and DialysisRenal disease  negative genitourinary   Musculoskeletal  (+) Fibromyalgia -  Abdominal Normal abdominal exam  (+)   Peds  Hematology negative hematology ROS (+) anemia ,   Anesthesia Other Findings See surgeon's H&P   Full upper bridge Lower partial bridge work  Reproductive/Obstetrics negative OB ROS                         Anesthesia Physical Anesthesia Plan  ASA: III  Anesthesia Plan: General and MAC   Post-op Pain Management:    Induction: Intravenous  Airway Management Planned: LMA and Simple Face Mask  Additional Equipment:   Intra-op Plan:   Post-operative Plan:   Informed Consent: I have reviewed the patients History and Physical, chart, labs and discussed the procedure including the risks, benefits and alternatives for the proposed anesthesia with the patient or authorized representative who has indicated his/her understanding and acceptance.   Dental Advisory Given  Plan Discussed with: CRNA and  Surgeon  Anesthesia Plan Comments:         Anesthesia Quick Evaluation

## 2013-02-23 NOTE — Op Note (Signed)
OPERATIVE NOTE   PROCEDURE: left brachiocephalic arteriovenous fistula placement  PRE-OPERATIVE DIAGNOSIS: end stage renal disease   POST-OPERATIVE DIAGNOSIS: same as above   SURGEON: Leonides Sake, MD  ASSISTANT(S): Doreatha Massed, PAC   ANESTHESIA: general  ESTIMATED BLOOD LOSS: 30 cc  FINDING(S):  Palpable thrill in left brachiocephalic arteriovenous fistula    Dopplerable left radial signal at end of case  SPECIMEN(S):  none  INDICATIONS:   Victor Lane is a 67 y.o. male who presents with end stage renal disease.  The patient is scheduled for left radiocephalic vs. brachiocephalic arteriovenous fistula placement.  The patient is aware the risks include but are not limited to: bleeding, infection, steal syndrome, nerve damage, ischemic monomelic neuropathy, failure to mature, and need for additional procedures.  The patient is aware of the risks of the procedure and elects to proceed forward.  DESCRIPTION: After full informed written consent was obtained from the patient, the patient was brought back to the operating room and placed supine upon the operating table.  Prior to induction, the patient received IV antibiotics.   After obtaining adequate anesthesia, the patient was then prepped and draped in the standard fashion for a left arm access procedure.  I turned my attention first to identifying the patient's distal cephalic vein and radial artery.  Under SonoSite, both appeared too small for use for a radiocephalic fistula.  I turned my attention to the upper arm and identified the cephalic vein and brachial artery.  Using SonoSite guidance, the location of these vessels were marked out on the skin.   At this point, I injected local anesthetic to obtain a field block of the antecubitum.  In total, I injected about 10 mL of a 1:1 mixture of 0.5% Marcaine without epinephrine and 1% lidocaine with epinephrine.  I made a transverse incision at the level of the antecubitum and  dissected through the subcutaneous tissue and fascia to gain exposure of the brachial artery.  This was noted to be 3 mm in diameter externally.  This was dissected out proximally and distally and controlled with vessel loops .  I then dissected out the cephalic vein.  This was noted to be 3 mm in diameter externally.  The distal segment of the vein was ligated with a  2-0 silk, and the vein was transected.  The proximal segment was iinterrogated with serial dilators.  The vein accepted up to a 4 mm dilator without any difficulty.  I then instilled the heparinized saline into the vein and clamped it.  At this point, I reset my exposure of the brachial artery and placed the artery under tension proximally and distally.  I made an arteriotomy with a #11 blade, and then I extended the arteriotomy with a Potts scissor.  I injected heparinized saline proximal and distal to this arteriotomy.  The vein was then sewn to the artery in an end-to-side configuration with a running stitch of 7-0 Prolene.  Prior to completing this anastomosis, I allowed the vein and artery to backbleed.  There was no evidence of clot from any vessels.  I completed the anastomosis in the usual fashion and then released all vessel loops and clamps.  There was a palpable  thrill in the venous outflow, and there was a dopplerable radial signal.  At this point, I irrigated out the surgical wound.  There was no further active bleeding.  The subcutaneous tissue was reapproximated with a running stitch of 3-0 Vicryl.  The skin was then reapproximated  with a running subcuticular stitch of 4-0 Vicryl.  The skin was then cleaned, dried, and reinforced with Dermabond.  The patient tolerated this procedure well.   COMPLICATIONS: none  CONDITION: stable  Leonides Sake, MD [facility] Office: [phone] Pager: [phone]  02/23/2013, 11:43 AM

## 2013-02-23 NOTE — Anesthesia Postprocedure Evaluation (Signed)
Anesthesia Post Note  Patient: Victor Lane  Procedure(s) Performed: Procedure(s) (LRB): ARTERIOVENOUS (AV) FISTULA CREATION (Left)  Anesthesia type: General  Patient location: PACU  Post pain: Pain level controlled  Post assessment: Patient's Cardiovascular Status Stable  Last Vitals:  Filed Vitals:   02/23/13 1421  BP: 108/58  Pulse: 96  Temp: 36.4 C  Resp: 18    Post vital signs: Reviewed and stable  Level of consciousness: alert  Complications: No apparent anesthesia complications

## 2013-02-23 NOTE — Progress Notes (Signed)
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 Jearld Fenton) ,undergoing RT [name]) adm to [facility] 8/22 with confusion, dyspnea, hypoxia, hyperkalemia & AKI with cr 11/ BUN 101 - transferred to [facility] for rt IJ permacath placement then , emergent HD. PCCM consulted 8/22 pm for worsening dyspnea while on HD requiring bipap.  SIGNIFICANT EVENTS: 7/16 microlaryngoscopy - Jearld Fenton ......................................................................................... 8/22 - Start HD, PCCM consulted for dyspnea, SVT 8/24 - Failed BiPAP >> VDRF, ENT consulted 8/26 - Perc trach by DF >> #8 trach 8/27 - Off vent, transfer to telemetry 8/29 - G tube per IR 9/02 - MBS: improved dysphagia, dysphagia 3 diet w/ clears started.  9/03 - AVG placed   STUDIES:    LINES / TUBES: Rt IJ permcath (IR) 8/22 >> Lt IJ CVL 8/24 >> 8/28 ETT 8/24 >> 8/26 Trach (DF) 8/26 >>  CULTURES: BCx2 8/22 >> Trach asp 8/24>>oral flora  ANTIBIOTICS: Zithromax 8/22 >> 8/22 Aztreonam 8/22 >> 8/22 Ancef 8/22 >> 8/22 Rocephin 8/22 >> 8/22 Vancomycin 8/22 >> 8/27 Cefepime 8/23 >> 8/28  SUBJECTIVE: seen by SLP, tolerating D3 (mechanical soft diet), thin liquids   VITAL SIGNS: Temp:  [97.4 F (36.3 C)-98.8 F (37.1 C)] 97.5 F (36.4 C) (09/04 1421) Pulse Rate:  [80-105] 96 (09/04 1421) Resp:  [13-20] 18 (09/04 1421) BP: (93-123)/(47-67) 108/58 mmHg (09/04 1421) SpO2:  [90 %-100 %] 96 % (09/04 1421) Weight:  [179 lb 0.2 oz (81.2 kg)] 179 lb 0.2 oz (81.2 kg) (09/04 0444)  INTAKE / OUTPUT: Intake/Output     09/03 0701 - 09/04 0700 09/04 0701 - 09/05 0700   P.O. 480    I.V. (mL/kg)  425 (5.2)   Total Intake(mL/kg)  480 (5.9) 425 (5.2)   Urine (mL/kg/hr) 1 (0)    Other 2200 (1.1)    Stool 1 (0)    Blood  20 (0)   Total Output 2202 20   Net -1722 +405        Stool Occurrence 1 x      PHYSICAL EXAMINATION: General: No distress Neuro: Alert, normal strength, follows commands HEENT: trach site clean. Speaking clearly Cardiovascular: regular, no murmur Lungs: clear  Abdomen:  New G tube Musculoskeletal:  No edema Skin:  No rash  LABS:  CBC Recent Labs     02/22/13  0524  02/23/13  0605  WBC  7.2  5.9  HGB  7.5*  7.2*  HCT  21.7*  22.4*  PLT  277  249   BMET Recent Labs     02/22/13  0524  02/22/13  0645  02/23/13  0605  NA  137  133*  140  K  4.0  3.4*  4.1  CL  94*  92*  100  CO2  23  23  26   BUN  75*  74*  43*  CREATININE  8.55*  9.14*  6.29*  GLUCOSE  129*  137*  115*   Electrolytes Recent Labs     02/22/13  0524  02/22/13  0645  02/23/13  0605  CALCIUM  7.2*  7.2*  7.7*  PHOS   --   8.8*   --    Liver Enzymes Recent Labs  02/22/13  0645  ALBUMIN  2.1*   Glucose Recent Labs     02/22/13  1044  02/22/13  2012  02/23/13  0002  02/23/13  0438  02/23/13  0835  02/23/13  1159  GLUCAP  123*  118*  113*  109*  109*  115*    Recent Labs Lab 02/17/13 0708 02/20/13 0654 02/21/13 1600 02/22/13 0524 02/23/13 0605  PROCALCITON  --   --  2.23 1.82 1.59  WBC 10.9* 12.7*  --  7.2 5.9    CXR: 9/2: negative  ASSESSMENT / PLAN:  Laryngeal cancer Trach status-->downsized to 6 on 9/2 P:   Cont red capping trial  Dysphagia precautions Goal to restart XRT asap, although acknowledge this will potentially stall swallowing progress. We may need to balance these goals.   Chills - CBC and PCT are reassuring  P: Not intervention  PAF with RVR >> NSR Hx of HTN. P:  Cont PO metoprolol  ESRD on HD - s/p AVF on 9/4 P:   -HD per renal -HD center will be OK as long as he can have trach capped -condom cath     Dysphagia - had MBS on 9/2, w/ marked  improvement.  Hx of GERD Diarrhea  S/P G tube 8/29.   P:   Dysphagia 3 diet w/ clear liquid Change tube feeds to nocturnal (likely) Will need out-pt SLP Lomotil PRN   Anemia of critical illness and chronic disease. P:  f/u CBC -transfuse for Hb < 7 aranesp per renal SQ heparin for DVT prevention  DM-2   P:   Holding glyburide Resume SSI for glu > 180  Anxiety - can't sleep, added low dose xanax PRN 9/2 Pain - tolerable, mostly just annoyed by abd brace  Deconditioning - working w/ PT P:   low dose xanax Cont PT Discharge planning   Canary Brim, NP-C [place] [facility] Pgr: [phone] or [phone]    Agree  Billy Fischer, MD ; [facility] service Mobile [phone].  After 5:30 PM or weekends, call [phone]

## 2013-02-23 NOTE — Transfer of Care (Signed)
Immediate Anesthesia Transfer of Care Note  Patient: Victor Lane  Procedure(s) Performed: Procedure(s): ARTERIOVENOUS (AV) FISTULA CREATION (Left)  Patient Location: PACU  Anesthesia Type:General  Level of Consciousness: awake, alert , oriented and patient cooperative  Airway & Oxygen Therapy: Patient Spontanous Breathing and Patient connected to nasal cannula oxygen  Post-op Assessment: Report given to PACU RN and Post -op Vital signs reviewed and stable  Post vital signs: Reviewed and stable  Complications: No apparent anesthesia complications

## 2013-02-23 NOTE — Preoperative (Signed)
Beta Blockers   Lopressor 25 mgs taken on

## 2013-02-23 NOTE — Progress Notes (Signed)
Speech Language Pathology Dysphagia Treatment Patient Details Name: Victor Lane MRN: [id number] DOB: [birth year] Today's Date: 02/23/2013 Time: 4098-1191 SLP Time Calculation (min): 18 min  Assessment / Plan / Recommendation Clinical Impression  Pt seen for f/u diet tolerance assessment and ongoing education. Intermittent cough noted before, during, and after PO intake (not immediately following bites/sips). Pt remains afebrile and per MD note 9/3 lung sounds remain clear. WBC count is downtrending. SLP reinforced changes that may occur during radiation treatment and encouraged pt to proceed cautiously with PO intake. Informed them that swallow studies can also be done on an outpatient basis. Pt and wife verbalized their understanding of all information and took notes. Will continue to follow.    Diet Recommendation  Continue with Current Diet: Dysphagia 3 (mechanical soft);Thin liquid    SLP Plan Continue with current plan of care   Pertinent Vitals/Pain N/A   Swallowing Goals  SLP Swallowing Goals Patient will consume recommended diet without observed clinical signs of aspiration with: Supervision/safety Swallow Study Goal #1 - Progress: Progressing toward goal Patient will utilize recommended strategies during swallow to increase swallowing safety with: Supervision/safety Swallow Study Goal #2 - Progress: Progressing toward goal  General Temperature Spikes Noted: No Respiratory Status: Room air (trach remains capped) Behavior/Cognition: Alert;Cooperative;Pleasant mood Oral Cavity - Dentition: Adequate natural dentition Patient Positioning: Upright in bed  Oral Cavity - Oral Hygiene Does patient have any of the following "at risk" factors?: Other - dysphagia Brush patient's teeth BID with toothbrush (using toothpaste with fluoride): Yes Patient is AT RISK - Oral Care Protocol followed (see row info): Yes   Dysphagia Treatment Treatment focused on: Skilled observation of diet  tolerance;Patient/family/caregiver education;Utilization of compensatory strategies Family/Caregiver Educated: wife Treatment Methods/Modalities: Skilled observation Patient observed directly with PO's: Yes Type of PO's observed: Dysphagia 3 (soft);Thin liquids Feeding: Able to feed self Liquids provided via: Cup;No straw Pharyngeal Phase Signs & Symptoms: Delayed cough Type of cueing: Verbal Amount of cueing: Minimal   GO     Maxcine Ham 02/23/2013, 4:15 PM  Maxcine Ham, M.A. CCC-SLP

## 2013-02-23 NOTE — H&P (View-Only) (Signed)
[facility] CONSULT NOTE Reason for Consult: ESRD Referring Physician: Dr. Arlean Hopping  History of Present Illness: Victor Lane is a 67 y.o. 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 We were asked to evaluate for permanent access.  Pt is RHD and states his BP is higher on the left as compared to the right.  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  Additional Present medical problems: DM, HTN, Carotid occlusive disease, GERD - all medically managed   Current Facility-Administered Medications  Medication Dose Route Frequency Provider Last Rate Last Dose  . 0.9 %  sodium chloride infusion  100 mL Intravenous PRN Maree Krabbe, MD      . 0.9 %  sodium chloride infusion  100 mL Intravenous PRN Maree Krabbe, MD      . antiseptic oral rinse (BIOTENE) solution 15 mL  15 mL Mouth Rinse QID Zigmund Gottron, MD   15 mL at 02/17/13 0400  . chlorhexidine (PERIDEX) 0.12 % solution 15 mL  15 mL Mouth Rinse BID Zigmund Gottron, MD   15 mL at 02/16/13 1944  . darbepoetin (ARANESP) injection 100 mcg  100 mcg Intravenous Q Sat-HD Cecille Aver, MD   100 mcg at 02/11/13 1317  . fentaNYL (SUBLIMAZE) injection 12.5-25 mcg  12.5-25 mcg Intravenous Q2H PRN Merwyn Katos, MD      . heparin injection 1,000 Units  1,000 Units Dialysis PRN Maree Krabbe, MD      . heparin injection 1,800 Units  20 Units/kg Dialysis PRN Cecille Aver, MD      . heparin injection 5,000 Units  5,000 Units Subcutaneous Q8H Dorothea Ogle, MD   5,000 Units at 02/16/13 2121  . levalbuterol (XOPENEX) nebulizer solution 0.63 mg  0.63 mg Nebulization Q3H PRN Coralyn Helling, MD   0.63 mg at 02/17/13 0426  . lidocaine (PF) (XYLOCAINE) 1 % injection 5 mL  5 mL Intradermal PRN Maree Krabbe, MD      . lidocaine-prilocaine  (EMLA) cream 1 application  1 application Topical PRN Maree Krabbe, MD      . metoprolol (LOPRESSOR) injection 5 mg  5 mg Intravenous Q6H Oretha Milch, MD   5 mg at 02/17/13 0559  . ondansetron (ZOFRAN) injection 4 mg  4 mg Intravenous Q6H PRN Dorothea Ogle, MD      . pentafluoroprop-tetrafluoroeth Peggye Pitt) aerosol 1 application  1 application Topical PRN Maree Krabbe, MD      . sodium chloride 0.9 % injection 3 mL  3 mL Intravenous Q12H Dorothea Ogle, MD   3 mL at 02/16/13 2121   Facility-Administered Medications Ordered in Other Encounters  Medication Dose Route Frequency Provider Last Rate Last Dose  . fentaNYL (SUBLIMAZE) injection   Intravenous PRN Berdine Dance, MD   25 mcg at 02/17/13 1333  . midazolam (VERSED) injection   Intravenous PRN Berdine Dance, MD   1 mg at 02/17/13 1327   Pt meds include: Statin :No Betablocker: Yes ASA: No Other anticoagulants/antiplatelets: none  Past Medical History  Diagnosis Date  . Diabetes mellitus   . Hypertension   . GERD (gastroesophageal reflux disease)   . Fibromyalgia   . Chest pain   . Carotid artery occlusion   . Chronic kidney disease     -  Stage 5- follwed by Dr Hyman Hopes  . Complication of anesthesia     agiation when he awaken  . Neuropathy   . Arthritis   . Sleep apnea   . H/O vitamin D deficiency   . Hearing loss   . Hypercholesteremia   . Myalgia and myositis   . 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 Cholecystectomy  1993  . Rotator cuff repair  07/06/2011    right  . Carotid endarterectomy Left   . Coolonoscopy    . Colonoscopy w/ biopsies and polypectomy      Hx: of  . Microlaryngoscopy with laser N/A 01/04/2013    Procedure: MICROLARYNGOSCOPY WITH BIOPSY/LASER;  Surgeon: Suzanna Obey, MD;  Location[facility] OR;  Service: ENT;  Laterality: N/A;    Social History History  Substance Use Topics  .  Smoking status: Former Smoker -- 2.00 packs/day for 31 years    Types: Cigarettes    Quit date: 06/22/1996  . Smokeless tobacco: Never Used  . Alcohol Use: No    Family History Family History  Problem Relation Age of Onset  . Adopted: Yes    Allergies  Allergen Reactions  . Amoxicillin     Causes upset stomach     REVIEW OF SYSTEMS  General: [ ]  Weight loss, [ ]  Fever, [ ]  chills Neurologic: [ ]  Dizziness, [ ]  Blackouts, [ ]  Seizure [ ]  Stroke, [ ]  "Mini stroke", [ ]  Slurred speech, [ ]  Temporary blindness; [ ]  weakness in arms or legs, [ ]  Hoarseness [x ] Dysphagia Cardiac: [ ]  Chest pain/pressure, [ ]  Shortness of breath at rest [ ]  Shortness of breath with exertion, [ ]  Atrial fibrillation or irregular heartbeat  Vascular: [ ]  Pain in legs with walking, [ ]  Pain in legs at rest, [ ]  Pain in legs at night,  [ ]  Non-healing ulcer, [ ]  Blood clot in vein/DVT,   Pulmonary: [ ]  Home oxygen, [ ]  Productive cough, [ ]  Coughing up blood, [ ]  Asthma,  [ ]  Wheezing [ ]  COPD Musculoskeletal:  [ ]  Arthritis, [ ]  Low back pain, [ ]  Joint pain Hematologic: [ ]  Easy Bruising, [ ]  Anemia; [ ]  Hepatitis Gastrointestinal: [ ]  Blood in stool, [ ]  Gastroesophageal Reflux/heartburn, Urinary: [x ] chronic Kidney disease, [x ] on HD - [ x] MWF or [ ]  TTHS, [ ]  Burning with urination, [ ]  Difficulty urinating Skin: [ ]  Rashes, [ ]  Wounds Psychological: [ ]  Anxiety, [ ]  Depression  Physical Examination Filed Vitals:   02/17/13 1030 02/17/13 1100 02/17/13 1105 02/17/13 1425  BP: 117/72 124/57 116/63 131/78  Pulse: 108 109 119 108  Temp:   98.2 F (36.8 C)   TempSrc:   Oral   Resp:   12   Height:      Weight:      SpO2:    100%   Body mass index is 26.67 kg/(m^2).  General:  WDWN in NAD HENT: WNL except trach in place Eyes: Pupils equal Pulmonary: normal non-labored breathing , without Rales, rhonchi,  wheezing Cardiac: RRR, without  Murmurs, rubs or gallops; Abdomen: soft, NT, no  masses Skin: no rashes, ulcers noted;  no Gangrene , no cellulitis; no open wounds;  Vascular Exam/Pulses: 2+ radial pulses bilat, right slightly less Musculoskeletal: no muscle wasting or atrophy; no edema  Neurologic: A&O  X 3; Appropriate Affect ;  SENSATION: normal; MOTOR FUNCTION: 5/5 Symmetric Speech is hoarse  Significant Diagnostic Studies: CBC Lab Results  Component Value Date   WBC 10.9* 02/17/2013   HGB 8.2* 02/17/2013   HCT 26.0* 02/17/2013   MCV 91.5 02/17/2013   PLT 209 02/17/2013   BMET    Component Value Date/Time   NA 143 02/17/2013 0708   K 3.8 02/17/2013 0708   CL 103 02/17/2013 0708   CO2 21 02/17/2013 0708   GLUCOSE 111* 02/17/2013 0708   BUN 77* 02/17/2013 0708   CREATININE 7.40* 02/17/2013 0708   CALCIUM 8.3* 02/17/2013 0708   GFRNONAA 7* 02/17/2013 0708   GFRAA 8* 02/17/2013 0708   Estimated Creatinine Clearance: 10 ml/min (by C-G formula based on Cr of 7.4).  COAG Lab Results  Component Value Date   INR 1.10 02/13/2013   INR 0.96 03/27/2011   Non-Invasive Vascular Imaging: Vein Mapping pending  Carotid Doppler at [facility] 07/08/12 - Right 60-79%  PSV/EDV  138/43 Left widely patent - S/P Left CEA by Dr. Hart Rochester 04/01/2011  ASSESSMENT/PLAN: NIKOLUS MARCZAK is a 67 y.o. male with Hx vocal cord CA with radiation, DM, HTN, Dysphagia who developed ESRD and is now on HD. Pt is in need of permanent access. Vein Mapping, remove IV from Left arm  numbness left small finger - Probable ulnar nerve compression As pt has a catheter, we can place fistula next week if he is still in the hospital or this can be done as an outpatient  ROCZNIAK,REGINA J 02/17/2013 2:30 PM  Agree with above.Vein map pending. We would not be able to do AVF until late next week. If he goes home before this, this can be arranged as an outpt.  Waverly Ferrari, MD, FACS Beeper [phone] 02/17/2013

## 2013-02-23 NOTE — Progress Notes (Signed)
[facility] ROUNDING NOTE   Subjective:   Interval History: alert and appears to be in great spirits this morning  Objective:  Vital signs in last 24 hours:  Temp:  [97.1 F (36.2 C)-98.8 F (37.1 C)] 98.8 F (37.1 C) (09/04 0444) Pulse Rate:  [80-105] 84 (09/04 0444) Resp:  [16-20] 17 (09/04 0444) BP: (94-123)/(44-67) 123/67 mmHg (09/04 0444) SpO2:  [90 %-100 %] 100 % (09/04 0444) Weight:  [81.2 kg (179 lb 0.2 oz)-81.7 kg (180 lb 1.9 oz)] 81.2 kg (179 lb 0.2 oz) (09/04 0444)  Weight change: -0.8 kg (-1 lb 12.2 oz) Filed Weights   02/22/13 0625 02/22/13 1010 02/23/13 0444  Weight: 82.7 kg (182 lb 5.1 oz) 81.7 kg (180 lb 1.9 oz) 81.2 kg (179 lb 0.2 oz)    Intake/Output: I/O last 3 completed shifts: In: 480 [P.O.:480] Out: 2202 [Urine:1; Other:2200; Stool:1]   Intake/Output this shift:     General: Alert,  Heart: RRR , no murmur or rub  Lungs: decr at bases, otherwise CTA  Abdomen:. Soft Peg tube in place, nontender  Extremities: Dialysis Access: No pedal edema / R IJ permcath     Basic Metabolic Panel:  Recent Labs Lab 02/17/13 0708 02/20/13 0654 02/22/13 0524 02/22/13 0645 02/23/13 0605  NA 143 135 137 133* 140  K 3.8 4.1 4.0 3.4* 4.1  CL 103 89* 94* 92* 100  CO2 21 25 23 23 26   GLUCOSE 111* 115* 129* 137* 115*  BUN 77* 98* 75* 74* 43*  CREATININE 7.40* 10.05* 8.55* 9.14* 6.29*  CALCIUM 8.3* 7.6* 7.2* 7.2* 7.7*  PHOS 5.7* 10.0*  --  8.8*  --     Liver Function Tests:  Recent Labs Lab 02/17/13 0708 02/20/13 0654 02/22/13 0645  ALBUMIN 1.9* 2.2* 2.1*   No results found for this basename: LIPASE, AMYLASE,  in the last 168 hours No results found for this basename: AMMONIA,  in the last 168 hours  CBC:  Recent Labs Lab 02/17/13 0708 02/20/13 0654 02/22/13 0524 02/23/13 0605  WBC 10.9* 12.7* 7.2 5.9  HGB 8.2* 8.1* 7.5* 7.2*  HCT 26.0* 23.8* 21.7* 22.4*  MCV 91.5 88.5 90.0 91.8  PLT 209 279 277 249    Cardiac Enzymes: No  results found for this basename: CKTOTAL, CKMB, CKMBINDEX, TROPONINI,  in the last 168 hours  BNP: No components found with this basename: POCBNP,   CBG:  Recent Labs Lab 02/22/13 0419 02/22/13 1044 02/22/13 2012 02/23/13 0002 02/23/13 0438  GLUCAP 122* 123* 118* 113* 109*    Microbiology: Results for orders placed during the hospital encounter of 02/10/13  MRSA PCR SCREENING     Status: None   Collection Time    02/10/13  6:44 PM      Result Value Range Status   MRSA by PCR NEGATIVE  NEGATIVE Final   Comment:            The GeneXpert MRSA Assay (FDA     approved for NASAL specimens     only), is one component of a     comprehensive MRSA colonization     surveillance program. It is not     intended to diagnose MRSA     infection nor to guide or     monitor treatment for     MRSA infections.  CULTURE, BLOOD (ROUTINE X 2)     Status: None   Collection Time    02/10/13  7:28 PM      Result Value  Range Status   Specimen Description BLOOD LEFT ARM   Final   Special Requests BOTTLES DRAWN AEROBIC AND ANAEROBIC 10CC   Final   Culture  Setup Time     Final   Value: 02/11/2013 01:15     Performed at [facility]   Culture     Final   Value: NO GROWTH 5 DAYS     Performed at [facility]   Report Status 02/17/2013 FINAL   Final  CULTURE, BLOOD (ROUTINE X 2)     Status: None   Collection Time    02/10/13  7:28 PM      Result Value Range Status   Specimen Description BLOOD LEFT ARM   Final   Special Requests BOTTLES DRAWN AEROBIC AND ANAEROBIC 10CC   Final   Culture  Setup Time     Final   Value: 02/11/2013 01:15     Performed at [facility]   Culture     Final   Value: NO GROWTH 5 DAYS     Performed at [facility]   Report Status 02/17/2013 FINAL   Final  CULTURE, RESPIRATORY (NON-EXPECTORATED)     Status: None   Collection Time    02/12/13 11:33 AM      Result Value Range Status   Specimen Description TRACHEAL ASPIRATE   Final    Special Requests Normal   Final   Gram Stain     Final   Value: ABUNDANT WBC PRESENT, PREDOMINANTLY PMN     RARE SQUAMOUS EPITHELIAL CELLS PRESENT     NO ORGANISMS SEEN     Performed at [facility]   Culture     Final   Value: Non-Pathogenic Oropharyngeal-type Flora Isolated.     Performed at [facility]   Report Status 02/15/2013 FINAL   Final    Coagulation Studies: No results found for this basename: LABPROT, INR,  in the last 72 hours  Urinalysis: No results found for this basename: COLORURINE, APPERANCEUR, LABSPEC, PHURINE, GLUCOSEU, HGBUR, BILIRUBINUR, KETONESUR, PROTEINUR, UROBILINOGEN, NITRITE, LEUKOCYTESUR,  in the last 72 hours    Imaging: Dg Chest Port 1 View  02/21/2013   *RADIOLOGY REPORT*  Clinical Data: Difficulty breathing; recent edema  PORTABLE CHEST - 1 VIEW  Comparison: February 16, 2013  Findings:  Tracheostomy tube as well seated. Left central catheter no longer appreciable.  Remaining central catheter tip is in the superior vena cava.  No pneumothorax.  There is no edema or consolidation.  Heart size and pulmonary vascularity are normal.  No adenopathy.  IMPRESSION: No edema or consolidation.  No pneumothorax.   Original Report Authenticated By: Bretta Bang, M.D.   Dg Swallowing Func-speech Pathology  02/21/2013   Maxcine Ham, CCC-SLP     02/21/2013  2:32 PM Objective Swallowing Evaluation: Modified Barium Swallowing Study   Patient Details  Name: JUSTUN ANAYA MRN: [id number] Date of Birth: [birth year]  Today's Date: 02/21/2013 Time: 1330-1350 SLP Time Calculation (min): 20 min  Past Medical History:  Past Medical History  Diagnosis Date  . Diabetes mellitus   . Hypertension   . GERD (gastroesophageal reflux disease)   . Fibromyalgia   . Chest pain   . Carotid artery occlusion   . Chronic kidney disease     - Stage 5- follwed by Dr Hyman Hopes  . Complication of anesthesia     agiation when he awaken  . Neuropathy   . Arthritis   . Sleep apnea   .  H/O  vitamin D deficiency   . Hearing loss   . Hypercholesteremia   . Myalgia and myositis   . 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 Cholecystectomy  1993  . Rotator cuff repair  07/06/2011    right  . Carotid endarterectomy Left   . Coolonoscopy    . Colonoscopy w/ biopsies and polypectomy      Hx: of  . 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 Jearld Fenton, final pathology report 01/04/2013)  currently undergoing radiation therapy under Dr. Basilio Cairo care, now  presenting to [facility] long emergency department after found to be  more lethargic at home and difficult to by his wife. Patient  explains he has felt progressively weaker over the past several  days and unable to ambulate, went to bed last night and provide  unable to wake up this morning. Wife also explains patient has  been more confused over the past several days. Patient also  reports progressively worsening shortness of breath and feeling  that he cannot take full deep breath, worse with exertion and  somewhat improved with rest, 2-3 pillow orthopnea. Patient denies  chest pain, no specific abdominal concerns. Patient explains he  is still able to void but has sensation of incomplete voiding.  Patient denies dysuria, urinary urgency or frequency, no flank  pain, no fevers, and no chills. Patient denies specific focal  neurological symptoms, no dizziness, no palpitations, no  visual  changes. Patient explains he was first diagnosed with kidney  disease about 7 years ago but has never had any workup done. He  has an appointment scheduled with Dr. Hyman Hopes this month for further  discussion on peritoneal dialysis. Patient reports he is not  interested in other forms of hemodialysis other than peritoneal  one.     Assessment / Plan / Recommendation Clinical Impression  Dysphagia Diagnosis: Mild pharyngeal phase dysphagia Clinical impression: Pt presents with significant improvement in  pharyngeal swallow function with mild sensorimotor deficits noted  during today's repeat objective study. Pharyngeal phase is  characterized by decreased movement of pharyngeal musculature s/p  radiation treatment as well as anatomical changes noted during  8/27 resulting in decreased vocal fold adduction, which lead to  decreased airway protection and pharyngeal clearance. Trace,  shallow penetration of thin liquids was observed only during  straw sips and mixed consistency boluses, with all penetrates  effectively cleared with a cued throat clear. Mild pharyngeal  residue at the valleculae and CP segment are reduced with a  second swallow, which is intermittently reflexive. Recommend to  initiate trial diet of Dys. 3 textures to facilitate clearance  and thin liquids with no straws.    Treatment Recommendation  Therapy as outlined in treatment plan below    Diet Recommendation Dysphagia 3 (Mechanical Soft);Thin liquid   Liquid Administration via: Cup;No straw Medication Administration: Whole meds with puree Supervision: Patient  able to self feed;Full supervision/cueing  for compensatory strategies Compensations: Slow rate;Small sips/bites;Multiple dry swallows  after each bite/sip Postural Changes and/or Swallow Maneuvers: Seated upright 90  degrees    Other  Recommendations Oral Care Recommendations: Oral care BID Other Recommendations: Place PMSV during PO intake   Follow Up Recommendations  Home health  SLP;Outpatient SLP    Frequency and Duration min 2x/week  2 weeks   Pertinent Vitals/Pain N/A    SLP Swallow Goals Patient will consume recommended diet without observed clinical  signs of aspiration with: Supervision/safety Patient will utilize recommended strategies during swallow to  increase swallowing safety with: Supervision/safety   General HPI: Pt is 67 yo male with multiple and complex medical  history including end-stage kidney disease, diabetes mellitus,  long history of tobacco abuse but quit 16 years ago, recent  diagnosis of 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 Jearld Fenton, final pathology report  01/04/2013) currently undergoing radiation therapy under Dr.  Basilio Cairo care, now presenting to [facility] long emergency department  after found to be more lethargic at home and difficult to by his  wife. Patient explains he has felt progressively weaker over the  past several days and unable to ambulate, went to bed last night  and provide unable to wake up this morning. Wife also explains  patient has been more confused over the past several days.  Patient also reports progressively worsening shortness of breath  and feeling that he cannot take full deep breath, worse with  exertion and somewhat improved with rest, 2-3 pillow orthopnea.  Patient denies chest pain, no specific abdominal concerns.  Patient explains he is still able to void but has sensation of  incomplete voiding. Patient denies dysuria, urinary urgency or  frequency, no flank pain, no fevers, and no chills. Patient  denies specific focal neurological symptoms, no dizziness, no  palpitations, no visual changes. Patient explains he was first  diagnosed with kidney disease about 7 years ago but has never had  any workup done. He has an appointment scheduled with Dr. Hyman Hopes  this month for further discussion on peritoneal dialysis. Patient  reports he is  not interested in other forms of hemodialysis other  than peritoneal one. Type of Study: Modified Barium Swallowing Study Reason for Referral: Objectively evaluate swallowing function Previous Swallow Assessment: FEES 8/27 (recommend NPO) Diet Prior to this Study: NPO;PEG tube Temperature Spikes Noted: No Respiratory Status: Trach (downsized and capped today) Trach Size and Type: Uncuffed;#6;Other (Comment) (capped) History of Recent Intubation: No Behavior/Cognition: Alert;Cooperative;Pleasant mood Oral Cavity - Dentition: Adequate natural dentition Oral Motor / Sensory Function: Within functional limits Self-Feeding Abilities: Able to feed self Patient Positioning: Upright in chair Baseline Vocal Quality: Hoarse (Baseline after removal of VC  lesions (SCCa)) Volitional Cough: Strong Volitional Swallow: Able to elicit Anatomy: Other (Comment) (Excised sections of VC  bilaterally-unable to view given MBS) Pharyngeal Secretions: Not observed secondary MBS    Reason for Referral Objectively evaluate swallowing function   Oral Phase Oral Preparation/Oral Phase Oral Phase: WFL   Pharyngeal Phase Pharyngeal Phase Pharyngeal Phase: Impaired Pharyngeal - Honey Pharyngeal - Honey Cup: Not tested Pharyngeal - Nectar Pharyngeal - Nectar Teaspoon: Reduced anterior laryngeal  mobility;Reduced laryngeal elevation;Reduced tongue base  retraction;Pharyngeal residue - valleculae;Pharyngeal residue -  cp segment;Compensatory strategies attempted (Comment) (second  swallow) Pharyngeal - Nectar Cup: Reduced anterior laryngeal  mobility;Reduced laryngeal elevation;Reduced tongue base  retraction;Pharyngeal residue -  valleculae;Pharyngeal residue -  cp segment;Compensatory strategies attempted (Comment) (second  swallow) Pharyngeal - Thin Pharyngeal - Thin Teaspoon: Reduced anterior laryngeal  mobility;Reduced laryngeal elevation;Reduced tongue base  retraction;Pharyngeal residue - valleculae;Pharyngeal residue -  cp  segment;Compensatory strategies attempted (Comment) (second  swallow) Pharyngeal - Thin Cup: Reduced anterior laryngeal  mobility;Reduced laryngeal elevation;Reduced tongue base  retraction;Pharyngeal residue - valleculae;Pharyngeal residue -  cp segment;Compensatory strategies attempted (Comment) (second  swallow) Pharyngeal - Thin Straw: Reduced anterior laryngeal  mobility;Reduced laryngeal elevation;Reduced tongue base  retraction;Pharyngeal residue - valleculae;Pharyngeal residue -  cp segment;Compensatory strategies attempted  (Comment);Penetration/Aspiration during swallow (second swallow) Penetration/Aspiration details (thin straw): Material enters  airway, remains ABOVE vocal cords and not ejected out Pharyngeal - Solids Pharyngeal - Puree: Reduced anterior laryngeal mobility;Reduced  laryngeal elevation;Reduced tongue base retraction;Pharyngeal  residue - valleculae;Pharyngeal residue - cp segment;Compensatory  strategies attempted (Comment) (second swallow) Penetration/Aspiration details (puree): Material does not enter  airway Pharyngeal - Mechanical Soft: Reduced anterior laryngeal  mobility;Reduced laryngeal elevation;Reduced tongue base  retraction;Pharyngeal residue - valleculae;Pharyngeal residue -  cp segment;Compensatory strategies attempted (Comment) (second  swallow) Pharyngeal - Pill: Reduced anterior laryngeal mobility;Reduced  laryngeal elevation;Reduced tongue base retraction;Pharyngeal  residue - valleculae;Pharyngeal residue - cp segment;Compensatory  strategies attempted (Comment);Penetration/Aspiration during  swallow (penetration of thin liquid during pill consumption) Penetration/Aspiration details (pill): Material enters airway,  remains ABOVE vocal cords and not ejected out (thin liquid  penetrates during pill administration)  Cervical Esophageal Phase    GO    Cervical Esophageal Phase Cervical Esophageal Phase: Impaired Cervical Esophageal Phase - Nectar Nectar Teaspoon: Reduced  cricopharyngeal relaxation Nectar Cup: Reduced cricopharyngeal relaxation Cervical Esophageal Phase - Thin Thin Teaspoon: Reduced cricopharyngeal relaxation Thin Cup: Reduced cricopharyngeal relaxation Thin Straw: Reduced cricopharyngeal relaxation Cervical Esophageal Phase - Solids Puree: Reduced cricopharyngeal relaxation Mechanical Soft: Reduced cricopharyngeal relaxation Pill: Reduced cricopharyngeal relaxation         Maxcine Ham 02/21/2013, 2:32 PM  Maxcine Ham, M.A. CCC-SLP    Medications:   . feeding supplement (NEPRO CARB STEADY) 1,000 mL (02/22/13 1835)  . lactated ringers     . [facility] HOLD] acetaminophen  650 mg Oral Once  . [facility] HOLD] antiseptic oral rinse  15 mL Mouth Rinse QID  . [facility] HOLD] calcium acetate  667 mg Oral TID WC  . [facility] HOLD] chlorhexidine  15 mL Mouth Rinse BID  . [MAR HOLD] darbepoetin (ARANESP) injection - DIALYSIS  100 mcg Intravenous Q Mon-HD  . [facility] HOLD] feeding supplement  30 mL Per Tube BID  . [facility] HOLD] ferric gluconate (FERRLECIT/NULECIT) IV  125 mg Intravenous Q M,W,F-HD  . [facility] HOLD] free water  100 mL Per Tube Q8H  . [MAR HOLD] heparin  5,000 Units Subcutaneous Q8H  . [facility] HOLD] metoprolol tartrate  12.5 mg Per Tube BID  . [MAR HOLD] multivitamin  1 tablet Oral QHS  . [facility] HOLD] pantoprazole sodium  40 mg Per Tube Daily  . [MAR HOLD] sodium chloride  3 mL Intravenous Q12H  . [facility] HOLD] vancomycin  1,000 mg Intravenous To OR   [[facility] HOLD] sodium chloride, [MAR HOLD] sodium chloride, [MAR HOLD] ALPRAZolam, [MAR HOLD] diphenoxylate-atropine, [MAR HOLD] feeding supplement (NEPRO CARB STEADY), [MAR HOLD] heparin, [MAR HOLD] heparin, [MAR HOLD] heparin, [MAR HOLD] hydrocortisone, [MAR HOLD] levalbuterol, [MAR HOLD] lidocaine (PF), [MAR HOLD] lidocaine (PF), [MAR HOLD] lidocaine-prilocaine, [MAR HOLD] lidocaine-prilocaine, [MAR HOLD] ondansetron (ZOFRAN) IV [[facility] HOLD] pentafluoroprop-tetrafluoroeth, [MAR HOLD] pentafluoroprop-tetrafluoroeth, [MAR  HOLD] zolpidem  Assessment/ Plan:  1.ESRD - new HD start  8/22. HD MWF in hosp. / using perm cath vein map done/ VVS to place AVF    2. laryngeal cancer.. PEG tube placed and tolerating Feedings .  3 Anemia -Follow Transfuse as necessary 4. Secondary hyperparathyroidism - Hyperphosphatemia binder prescribed 5HTN/volume - Stable volume 6 Nutrition - Tube feedings and renal vitamin  7.Aspiration [place] vs Pulmonary Edema= CX's neg/ sp Abx.  8. Deconditioning- wants to do rehab/ was working at home  10. Very complicated issues that will need to be addressed prior to discharge      LOS: 13 Tasheena [place] W @TODAY @8 :11 AM

## 2013-02-23 NOTE — Progress Notes (Signed)
02/23/2013 3:16 PM Hemodialysis Outpatient Note; this patient was declined by the [facility] due to trach issues at this time. Ultimately it is left up to the medical director to accept or decline patients but as was explained  to me they desire  a trach to be capped(which it is and I explained that to the center) and not needing suctioning or dressing changes. I will continue to update the center with any further progress. Thank you.Tilman Neat

## 2013-02-23 NOTE — Telephone Encounter (Signed)
Message copied by Margaretmary Eddy on Thu Feb 23, 2013  1:10 PM ------      Message from: Melene Plan      Created: Thu Feb 23, 2013 12:47 PM                   ----- Message -----         From: Fransisco Hertz, MD         Sent: 02/23/2013  11:47 AM           To: Reuel Derby, Melene Plan, RN            ZAILEN ALBARRAN      [id number]      1946-03-05            Procedure:      left brachiocephalic arteriovenous fistula placement            Asst: Doreatha Massed, PAC             Follow-up: 4 weeks ------

## 2013-02-24 ENCOUNTER — Encounter (HOSPITAL_COMMUNITY): Payer: Self-pay | Admitting: Vascular Surgery

## 2013-02-24 ENCOUNTER — Ambulatory Visit
Admission: RE | Admit: 2013-02-24 | Discharge: 2013-02-24 | Disposition: A | Discharge status: Home / Self Care | Payer: Medicare Other | Source: Ambulatory Visit | Attending: Radiation Oncology | Admitting: Radiation Oncology

## 2013-02-24 ENCOUNTER — Other Ambulatory Visit: Payer: Self-pay | Admitting: Acute Care

## 2013-02-24 LAB — RENAL FUNCTION PANEL
BUN: 58 mg/dL — ABNORMAL HIGH (ref 6–23)
CO2: 22 mEq/L (ref 19–32)
Calcium: 7.2 mg/dL — ABNORMAL LOW (ref 8.4–10.5)
GFR calc Af Amer: 6 mL/min — ABNORMAL LOW (ref >90)
Glucose, Bld: 112 mg/dL — ABNORMAL HIGH (ref 70–99)
Potassium: 4.1 mEq/L (ref 3.5–5.1)
Sodium: 132 mEq/L — ABNORMAL LOW (ref 135–145)

## 2013-02-24 LAB — CBC
HCT: 20 % — ABNORMAL LOW (ref 39.0–52.0)
Hemoglobin: 6.7 g/dL — CL (ref 13.0–17.0)
MCV: 90.5 fL (ref 78.0–100.0)
WBC: 7.4 10*3/uL (ref 4.0–10.5)

## 2013-02-24 LAB — GLUCOSE, CAPILLARY: Glucose-Capillary: 118 mg/dL — ABNORMAL HIGH (ref 70–99)

## 2013-02-24 LAB — PREPARE RBC (CROSSMATCH)

## 2013-02-24 MED ORDER — HEPARIN SODIUM (PORCINE) 1000 UNIT/ML DIALYSIS: 1000.0000 [IU] | INTRAMUSCULAR | Status: DC | PRN

## 2013-02-24 MED ORDER — OXYCODONE-ACETAMINOPHEN 5-325 MG PO TABS
1.0000 | ORAL_TABLET | ORAL | Status: DC | PRN
  Administered 2013-02-24: 1 via ORAL
  Administered 2013-02-24 (×2): 2 via ORAL
  Administered 2013-02-25: 1 via ORAL
  Filled 2013-02-24: qty 1
  Filled 2013-02-24: qty 2
  Filled 2013-02-24 (×2): qty 1

## 2013-02-24 MED ORDER — ALTEPLASE 2 MG IJ SOLR
2.0000 mg | Freq: Once | INTRAMUSCULAR | Status: DC | PRN
  Filled 2013-02-24: qty 2

## 2013-02-24 MED ORDER — OXYCODONE-ACETAMINOPHEN 5-325 MG PO TABS
ORAL_TABLET | ORAL | Status: AC
  Filled 2013-02-24: qty 2

## 2013-02-24 MED ORDER — LIDOCAINE-PRILOCAINE 2.5-2.5 % EX CREA: 1.0000 "application " | TOPICAL_CREAM | CUTANEOUS | Status: DC | PRN

## 2013-02-24 MED ORDER — LIDOCAINE HCL (PF) 1 % IJ SOLN: 5.0000 mL | INTRAMUSCULAR | Status: DC | PRN

## 2013-02-24 MED ORDER — NEPRO/CARBSTEADY PO LIQD
237.0000 mL | ORAL | Status: DC | PRN
  Filled 2013-02-24: qty 237

## 2013-02-24 MED ORDER — SODIUM CHLORIDE 0.9 % IV SOLN: 100.0000 mL | INTRAVENOUS | Status: DC | PRN

## 2013-02-24 MED ORDER — PENTAFLUOROPROP-TETRAFLUOROETH EX AERO: 1.0000 "application " | INHALATION_SPRAY | CUTANEOUS | Status: DC | PRN

## 2013-02-24 NOTE — Progress Notes (Signed)
[facility] [place] [facility]  Daily Progress Note  Assessment/Planning: POD #1 s/p L BC AVF   Good thrill and bruit in fistula without steal sx  Small hematoma/seroma in right antecubitum  I doubt this patient's sx are due to the hematoma as no tension present to compress the underlying nerve, more likely post-surgical pain.  Follow up in four weeks in the office.  Subjective  - 1 Day Post-Op  Left hand numbness (pre-existing prior to procedure), no weakness or pain in hand, pain at surgical site  Objective Filed Vitals:   02/24/13 1106 02/24/13 1145 02/24/13 1350 02/24/13 1547  BP: 122/62 109/50 119/56   Pulse: 102 101 100 100  Temp: 97.7 F (36.5 C) 98 F (36.7 C) 98.1 F (36.7 C)   TempSrc: Oral Oral Oral   Resp: 28 20 18 20   Height:      Weight: 178 lb 2.1 oz (80.8 kg)     SpO2: 97% 96% 95% 98%    Intake/Output Summary (Last 24 hours) at 02/24/13 1825 Last data filed at 02/24/13 1700  Gross per 24 hour  Intake   1140 ml  Output   2003 ml  Net   -863 ml    PULM  CTAB CV  RRR GI  soft, NTND VASC  L antecubital incision c/d/i, small hematoma, with some echymosis, +bruit, +thrill, hand grip intact, sensation grossly intact  Laboratory CBC    Component Value Date/Time   WBC 7.4 02/24/2013 0639   HGB 6.7* 02/24/2013 0639   HCT 20.0* 02/24/2013 0639   PLT 272 02/24/2013 0639    BMET    Component Value Date/Time   NA 132* 02/24/2013 0639   K 4.1 02/24/2013 0639   CL 92* 02/24/2013 0639   CO2 22 02/24/2013 0639   GLUCOSE 112* 02/24/2013 0639   BUN 58* 02/24/2013 0639   CREATININE 8.81* 02/24/2013 0639   CALCIUM 7.2* 02/24/2013 0639   GFRNONAA 5* 02/24/2013 0639   GFRAA 6* 02/24/2013 4782    Leonides Sake, MD [facility] [place] Office: [phone] Pager: [phone]  02/24/2013, 6:25 PM

## 2013-02-24 NOTE — Progress Notes (Signed)
CRITICAL VALUE ALERT  Critical value received: Hemoglobin 6.7  Date of notification:  02-24-2013  Time of notification:  0730   Critical value read back: yes  Nurse who received alert: Kandis Ban  MD notified (1st page): Dr. Lajuana Carry  Time of first page:  [phone]  MD notified (2nd page):  Time of second page:  Responding MD:  Dr. Lajuana Carry  Time MD responded: arrived on unit 0745

## 2013-02-24 NOTE — Progress Notes (Signed)
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 Jearld Fenton) ,undergoing RT [name]) adm to [facility] 8/22 with confusion, dyspnea, hypoxia, hyperkalemia & AKI with cr 11/ BUN 101 - transferred to [facility] for rt IJ permacath placement then , emergent HD. PCCM consulted 8/22 pm for worsening dyspnea while on HD requiring bipap.  SIGNIFICANT EVENTS: 7/16 microlaryngoscopy - Jearld Fenton ......................................................................................... 8/22 - Start HD, PCCM consulted for dyspnea, SVT 8/24 - Failed BiPAP >> VDRF, ENT consulted 8/26 - Perc trach by DF >> #8 trach 8/27 - Off vent, transfer to telemetry 8/29 - G tube per IR 9/02 - MBS: improved dysphagia, dysphagia 3 diet w/ clears started.  9/03 - AVG placed   STUDIES:    LINES / TUBES: Rt IJ permcath (IR) 8/22 >> Lt IJ CVL 8/24 >> 8/28 ETT 8/24 >> 8/26 Trach (DF) 8/26 >>  CULTURES: BCx2 8/22 >> Trach asp 8/24>>oral flora  ANTIBIOTICS: Zithromax 8/22 >> 8/22 Aztreonam 8/22 >> 8/22 Ancef 8/22 >> 8/22 Rocephin 8/22 >> 8/22 Vancomycin 8/22 >> 8/27 Cefepime 8/23 >> 8/28  SUBJECTIVE: seen by SLP, tolerating D3 (mechanical soft diet), thin liquids   VITAL SIGNS: Temp:  [97.4 F (36.3 C)-98.3 F (36.8 C)] 97.8 F (36.6 C) (09/05 1027) Pulse Rate:  [90-106] 101 (09/05 1027) Resp:  [5-26] 12 (09/05 1027) BP: (93-133)/(47-81) 122/58 mmHg (09/05 1027) SpO2:  [96 %-100 %] 100 % (09/05 0945) FiO2 (%):  [21 %-28 %] 28 % (09/05 0445) Weight:  [82.736 kg (182 lb 6.4 oz)-83 kg (182 lb 15.7 oz)] 83 kg (182 lb 15.7 oz) (09/05 0650)  INTAKE / OUTPUT: Intake/Output     09/04 0701 - 09/05 0700 09/05 0701 - 09/06  0700   P.O. 240    I.V. (mL/kg) 425 (5.1) 100 (1.2)   Blood  560   Total Intake(mL/kg) 665 (8) 660 (8)   Urine (mL/kg/hr) 1 (0)    Other     Stool     Blood 20 (0)    Total Output 21     Net +644 +660          PHYSICAL EXAMINATION: General: No distress Neuro: Alert, normal strength, follows commands HEENT: trach site clean. Speaking clearly Cardiovascular: regular, no murmur Lungs: clear  Abdomen:  New G tube Musculoskeletal:  No edema Skin:  No rash  LABS:  CBC  Recent Labs     02/22/13  0524  02/23/13  0605  02/24/13  0639  WBC  7.2  5.9  7.4  HGB  7.5*  7.2*  6.7*  HCT  21.7*  22.4*  20.0*  PLT  277  249  272   BMET Recent Labs     02/22/13  0645  02/23/13  0605  02/24/13  0639  NA  133*  140  132*  K  3.4*  4.1  4.1  CL  92*  100  92*  CO2  23  26  22   BUN  74*  43*  58*  CREATININE  9.14*  6.29*  8.81*  GLUCOSE  137*  115*  112*   Electrolytes Recent Labs     02/22/13  0645  02/23/13  9562  02/24/13  1308  CALCIUM  7.2*  7.7*  7.2*  PHOS  8.8*   --   7.4*   Liver Enzymes Recent Labs     02/22/13  0645  02/24/13  0639  ALBUMIN  2.1*  2.2*   Glucose Recent Labs     02/23/13  1159  02/23/13  1405  02/23/13  1605  02/23/13  2008  02/23/13  2348  02/24/13  0417  GLUCAP  115*  90  90  182*  117*  114*    Recent Labs Lab 02/20/13 0654 02/21/13 1600 02/22/13 0524 02/23/13 0605 02/24/13 0639  PROCALCITON  --  2.23 1.82 1.59  --   WBC 12.7*  --  7.2 5.9 7.4    CXR: 9/2: negative  ASSESSMENT / PLAN:  Laryngeal cancer Trach status-->downsized to 6 on 9/2 Tolerating red-cap day and ATC night  P:   Dysphagia precautions Goal to restart XRT asap, although acknowledge this will potentially stall swallowing progress. We may need to balance these goals.  F/u [facility] trach clinic OCt 7 at 230 pm   PAF with RVR >> NSR Hx of HTN. P:  Cont PO metoprolol  ESRD on HD - s/p AVF on 9/4 Now clear for HD center w/ trach capped, awaiting  for the [facility] center to set up logistics of when, and also for final approval as he was initially turned down.   P:   Cont HD per nephro Spoke to Dr Conseco who will arrange out pt follow up  Will need out-pt follow up w/ vascular re: AVG   Dysphagia - had MBS on 9/2, w/ marked improvement.  Hx of GERD Diarrhea -->improved  S/P G tube 8/29.   P:   Dysphagia 3 diet w/ clear liquid Will take in 1 can of Nepro TID for supplementation (can drink this if prefer) Flush PEG w/ free water  (54ml/ tap water three times a day @ d/c)  Lomotil PRN   Anemia of critical illness and chronic disease. Has had almost a gram drop in Hgb for last 48hrs s/p AVG. Getting blood per Nephrology  Note from Vascular notes small hematoma at AVG site.   Recent Labs Lab 02/22/13 0524 02/23/13 0605 02/24/13 0639  HGB 7.5* 7.2* 6.7*   P:  f/u CBC -transfuse for Hb < 7 Will need f/u cbc in am  aranesp per renal Hold Heparin  Vascular to re-eval site on rounds   DM-2   P:   Holding glyburide Resume SSI for glu > 180  Anxiety - can't sleep, added low dose xanax PRN 9/2 Pain - tolerable, mostly just annoyed by abd brace  Deconditioning - working w/ PT P:   low dose xanax Cont PT Discharge planning  Levy Pupa, MD, PhD 02/24/2013, 5:19 PM [facility] [phone] or if no answer [phone]

## 2013-02-24 NOTE — Progress Notes (Signed)
PT Cancellation Note  Patient Details Name: Victor Lane MRN: [id number] DOB: 02-09-1946   Cancelled Treatment:    Reason Eval/Treat Not Completed: Patient at procedure or test/unavailable (in HD)   Toney Sang [facility] 02/24/2013, 7:24 AM [name], PT [phone]

## 2013-02-24 NOTE — Progress Notes (Signed)
I have seen and examined this patient and agree with the plan of care. Very pale Hb 7 will need. BP 110/70 Catheter good flow. Will transfuse 2 units PRBs  Victor Lane W 02/24/2013, 7:51 AM

## 2013-02-24 NOTE — Progress Notes (Signed)
Pt C/O pain in Right/left lower leg radiating to the Ankle & foot. Pain is described as shooting and uncomfortable. On call MD Edilia Bo, Cristal Deer) paged & notified. New orders received, will continue plan of care.

## 2013-02-25 LAB — TYPE AND SCREEN
Antibody Screen: NEGATIVE
Unit division: 0

## 2013-02-25 LAB — BASIC METABOLIC PANEL
BUN: 33 mg/dL — ABNORMAL HIGH (ref 6–23)
CO2: 26 mEq/L (ref 19–32)
Chloride: 93 mEq/L — ABNORMAL LOW (ref 96–112)
Creatinine, Ser: 5.94 mg/dL — ABNORMAL HIGH (ref 0.50–1.35)
Potassium: 3.9 mEq/L (ref 3.5–5.1)

## 2013-02-25 LAB — CBC
HCT: 25.8 % — ABNORMAL LOW (ref 39.0–52.0)
Hemoglobin: 8.5 g/dL — ABNORMAL LOW (ref 13.0–17.0)
MCV: 90.5 fL (ref 78.0–100.0)
RBC: 2.85 MIL/uL — ABNORMAL LOW (ref 4.22–5.81)
WBC: 8 10*3/uL (ref 4.0–10.5)

## 2013-02-25 MED ORDER — METOPROLOL TARTRATE 25 MG PO TABS: 12.5000 mg | ORAL_TABLET | Freq: Two times a day (BID) | ORAL | Status: DC

## 2013-02-25 MED ORDER — TRAMADOL HCL 50 MG PO TABS: 100.0000 mg | ORAL_TABLET | Freq: Three times a day (TID) | ORAL | Status: DC

## 2013-02-25 MED ORDER — GABAPENTIN 400 MG PO CAPS: 400.0000 mg | ORAL_CAPSULE | Freq: Once | ORAL | Status: DC

## 2013-02-25 MED ORDER — FINASTERIDE 5 MG PO TABS: 5.0000 mg | ORAL_TABLET | Freq: Once | ORAL | Status: DC

## 2013-02-25 MED ORDER — PREDNISONE 20 MG PO TABS
40.0000 mg | ORAL_TABLET | Freq: Once | ORAL | Status: AC
  Administered 2013-02-25: 40 mg via ORAL
  Filled 2013-02-25: qty 2

## 2013-02-25 MED ORDER — CALCIUM ACETATE 667 MG PO CAPS: 667.0000 mg | ORAL_CAPSULE | Freq: Three times a day (TID) | ORAL | Status: DC

## 2013-02-25 MED ORDER — ZOLPIDEM TARTRATE 5 MG PO TABS: 10.0000 mg | ORAL_TABLET | Freq: Every day | ORAL | Status: DC

## 2013-02-25 MED ORDER — BUPROPION HCL ER (SMOKING DET) 150 MG PO TB12: 150.0000 mg | ORAL_TABLET | Freq: Once | ORAL | Status: DC

## 2013-02-25 NOTE — Progress Notes (Signed)
Discharged to home with family office visits in place teaching done Discharged to home with family office visits in place teaching done  

## 2013-02-25 NOTE — Progress Notes (Signed)
[facility] ROUNDING NOTE   Subjective:   Interval History: awake and alert  Objective:  Vital signs in last 24 hours:  Temp:  [97.7 F (36.5 C)-99.1 F (37.3 C)] 98.8 F (37.1 C) (09/06 0413) Pulse Rate:  [90-106] 92 (09/06 0413) Resp:  [12-28] 20 (09/06 0413) BP: (108-122)/(50-63) 111/63 mmHg (09/06 0413) SpO2:  [93 %-98 %] 96 % (09/06 0413) FiO2 (%):  [21 %] 21 % (09/06 0355) Weight:  [80.8 kg (178 lb 2.1 oz)-81.602 kg (179 lb 14.4 oz)] 81.602 kg (179 lb 14.4 oz) (09/06 0413)  Weight change: -1.936 kg (-4 lb 4.3 oz) Filed Weights   02/24/13 0650 02/24/13 1106 02/25/13 0413  Weight: 83 kg (182 lb 15.7 oz) 80.8 kg (178 lb 2.1 oz) 81.602 kg (179 lb 14.4 oz)    Intake/Output: I/O last 3 completed shifts: In: 1340 [P.O.:480; I.V.:100; Blood:560; Other:100; NG/GT:100] Out: 2002 [Urine:1; Other:2001]   Intake/Output this shift:    General: Alert,  Heart: RRR , no murmur or rub  Lungs: decr at bases, otherwise CTA  Abdomen:. Soft Peg tube in place, nontender  Extremities: Dialysis Access: No pedal edema / R IJ permcath     Basic Metabolic Panel:  Recent Labs Lab 02/20/13 0654 02/22/13 0524 02/22/13 0645 02/23/13 0605 02/24/13 0639 02/25/13 0905  NA 135 137 133* 140 132* 134*  K 4.1 4.0 3.4* 4.1 4.1 3.9  CL 89* 94* 92* 100 92* 93*  CO2 25 23 23 26 22 26   GLUCOSE 115* 129* 137* 115* 112* 127*  BUN 98* 75* 74* 43* 58* 33*  CREATININE 10.05* 8.55* 9.14* 6.29* 8.81* 5.94*  CALCIUM 7.6* 7.2* 7.2* 7.7* 7.2* 7.6*  PHOS 10.0*  --  8.8*  --  7.4*  --     Liver Function Tests:  Recent Labs Lab 02/20/13 0654 02/22/13 0645 02/24/13 0639  ALBUMIN 2.2* 2.1* 2.2*   No results found for this basename: LIPASE, AMYLASE,  in the last 168 hours No results found for this basename: AMMONIA,  in the last 168 hours  CBC:  Recent Labs Lab 02/20/13 0654 02/22/13 0524 02/23/13 0605 02/24/13 0639 02/25/13 0905  WBC 12.7* 7.2 5.9 7.4 8.0  HGB 8.1* 7.5*  7.2* 6.7* 8.5*  HCT 23.8* 21.7* 22.4* 20.0* 25.8*  MCV 88.5 90.0 91.8 90.5 90.5  PLT 279 277 249 272 208    Cardiac Enzymes: No results found for this basename: CKTOTAL, CKMB, CKMBINDEX, TROPONINI,  in the last 168 hours  BNP: No components found with this basename: POCBNP,   CBG:  Recent Labs Lab 02/24/13 0417 02/24/13 1139 02/24/13 1951 02/25/13 0016 02/25/13 0416  GLUCAP 114* 118* 146* 126* 130*    Microbiology: Results for orders placed during the hospital encounter of 02/10/13  MRSA PCR SCREENING     Status: None   Collection Time    02/10/13  6:44 PM      Result Value Range Status   MRSA by PCR NEGATIVE  NEGATIVE Final   Comment:            The GeneXpert MRSA Assay (FDA     approved for NASAL specimens     only), is one component of a     comprehensive MRSA colonization     surveillance program. It is not     intended to diagnose MRSA     infection nor to guide or     monitor treatment for     MRSA infections.  CULTURE, BLOOD (ROUTINE X 2)  Status: None   Collection Time    02/10/13  7:28 PM      Result Value Range Status   Specimen Description BLOOD LEFT ARM   Final   Special Requests BOTTLES DRAWN AEROBIC AND ANAEROBIC 10CC   Final   Culture  Setup Time     Final   Value: 02/11/2013 01:15     Performed at [facility]   Culture     Final   Value: NO GROWTH 5 DAYS     Performed at [facility]   Report Status 02/17/2013 FINAL   Final  CULTURE, BLOOD (ROUTINE X 2)     Status: None   Collection Time    02/10/13  7:28 PM      Result Value Range Status   Specimen Description BLOOD LEFT ARM   Final   Special Requests BOTTLES DRAWN AEROBIC AND ANAEROBIC 10CC   Final   Culture  Setup Time     Final   Value: 02/11/2013 01:15     Performed at [facility]   Culture     Final   Value: NO GROWTH 5 DAYS     Performed at [facility]   Report Status 02/17/2013 FINAL   Final  CULTURE, RESPIRATORY (NON-EXPECTORATED)      Status: None   Collection Time    02/12/13 11:33 AM      Result Value Range Status   Specimen Description TRACHEAL ASPIRATE   Final   Special Requests Normal   Final   Gram Stain     Final   Value: ABUNDANT WBC PRESENT, PREDOMINANTLY PMN     RARE SQUAMOUS EPITHELIAL CELLS PRESENT     NO ORGANISMS SEEN     Performed at [facility]   Culture     Final   Value: Non-Pathogenic Oropharyngeal-type Flora Isolated.     Performed at [facility]   Report Status 02/15/2013 FINAL   Final    Coagulation Studies: No results found for this basename: LABPROT, INR,  in the last 72 hours  Urinalysis: No results found for this basename: COLORURINE, APPERANCEUR, LABSPEC, PHURINE, GLUCOSEU, HGBUR, BILIRUBINUR, KETONESUR, PROTEINUR, UROBILINOGEN, NITRITE, LEUKOCYTESUR,  in the last 72 hours    Imaging: No results found.   Medications:   . sodium chloride 10 mL/hr (02/23/13 0839)   . acetaminophen  650 mg Oral Once  . antiseptic oral rinse  15 mL Mouth Rinse QID  . calcium acetate  667 mg Oral TID WC  . chlorhexidine  15 mL Mouth Rinse BID  . darbepoetin (ARANESP) injection - DIALYSIS  100 mcg Intravenous Q Mon-HD  . feeding supplement (NEPRO CARB STEADY)  237 mL Per Tube TID BM  . ferric gluconate (FERRLECIT/NULECIT) IV  125 mg Intravenous Q M,W,F-HD  . free water  100 mL Per Tube Q8H  . metoprolol tartrate  12.5 mg Per Tube BID  . multivitamin  1 tablet Oral QHS  . pantoprazole sodium  40 mg Per Tube Daily  . sodium chloride  3 mL Intravenous Q12H   sodium chloride, sodium chloride, ALPRAZolam, diphenoxylate-atropine, heparin, heparin, heparin, hydrocortisone, levalbuterol, lidocaine (PF), lidocaine (PF), lidocaine-prilocaine, lidocaine-prilocaine, ondansetron (ZOFRAN) IV, oxyCODONE-acetaminophen, pentafluoroprop-tetrafluoroeth, pentafluoroprop-tetrafluoroeth, zolpidem  Assessment/ Plan:  1.ESRD - new HD start 8/22. Access placed 02/23/13  Appointment [facility]  dialysis unit Monday 10.30 2. laryngeal cancer.. PEG tube placed and tolerating Feedings .  3 Anemia -Follow Transfused 2 units 4. Secondary hyperparathyroidism - Hyperphosphatemia binder prescribed  situation is challenging with tube feeding 5HTN/volume - Stable volume  6 Nutrition - Tube feedings and renal vitamin  7.Aspiration [place] vs Pulmonary Edema= CX's neg/ sp Abx.  8. Deconditioning- wants to do rehab/ was working at home     Patients hemoglobin better may D/c today and follow up outpatient dialysis Monday 10.30   LOS: 15 Glendene Wyer W @TODAY @10 :11 AM

## 2013-02-25 NOTE — Progress Notes (Signed)
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 Jearld Fenton) ,undergoing RT [name]) adm to [facility] 8/22 with confusion, dyspnea, hypoxia, hyperkalemia & AKI with cr 11/ BUN 101 - transferred to [facility] for rt IJ permacath placement then , emergent HD. PCCM consulted 8/22 pm for worsening dyspnea while on HD requiring bipap.  SIGNIFICANT EVENTS: 7/16 microlaryngoscopy - Jearld Fenton ......................................................................................... 8/22 - Start HD, PCCM consulted for dyspnea, SVT 8/24 - Failed BiPAP >> VDRF, ENT consulted 8/26 - Perc trach by DF >> #8 trach 8/27 - Off vent, transfer to telemetry 8/29 - G tube per IR 9/02 - MBS: improved dysphagia, dysphagia 3 diet w/ clears started.  9/03 - AVG placed   STUDIES:   LINES / TUBES: Rt IJ permcath (IR) 8/22 >> Lt IJ CVL 8/24 >> 8/28 ETT 8/24 >> 8/26 Trach (DF) 8/26 >>  CULTURES: BCx2 8/22 >> Trach asp 8/24>>oral flora  ANTIBIOTICS: Zithromax 8/22 >> 8/22 Aztreonam 8/22 >> 8/22 Ancef 8/22 >> 8/22 Rocephin 8/22 >> 8/22 Vancomycin 8/22 >> 8/27 Cefepime 8/23 >> 8/28  SUBJECTIVE: seen by SLP, tolerating D3 (mechanical soft diet), thin liquids 9/6> wkend coverage- chart reviewed in detail- pt tells me that he is to go home tomorrow 9/7 "they have to solidify my appts, I need to clean up, rest, get some exercise"; he was Tx 2u PC yest...  VITAL SIGNS: Temp:  [97.4 F (36.3 C)-99.1 F (37.3 C)] 98.8 F (37.1 C) (09/06 0413) Pulse Rate:  [90-106] 92 (09/06 0413) Resp:  [12-28] 20 (09/06 0413) BP: (108-133)/(50-63) 111/63 mmHg (09/06 0413) SpO2:  [93 %-100 %] 96 % (09/06 0413) FiO2 (%):  [21 %] 21 % (09/06  0355) Weight:  [80.8 kg (178 lb 2.1 oz)-81.602 kg (179 lb 14.4 oz)] 81.602 kg (179 lb 14.4 oz) (09/06 0413)  INTAKE / OUTPUT: Intake/Output     09/05 0701 - 09/06 0700 09/06 0701 - 09/07 0700   P.O. 480    I.V. (mL/kg) 100 (1.2)    Blood 560    Other 100    NG/GT 100    Total Intake(mL/kg) 1340 (16.4)    Urine (mL/kg/hr) 1 (0)    Other 2001 (1)    Blood     Total Output 2002     Net -662            PHYSICAL EXAMINATION: General: No distress Neuro: Alert, normal strength, follows commands HEENT: trach site clean. Speaking clearly w/ finger over trach Cardiovascular: regular, no murmur Lungs: clear  Abdomen:  New G tube Musculoskeletal:  No edema Skin:  No rash  LABS:  CBC  Recent Labs     02/23/13  0605  02/24/13  0639  WBC  5.9  7.4  HGB  7.2*  6.7*  HCT  22.4*  20.0*  PLT  249  272   BMET Recent Labs     02/23/13  0605  02/24/13  0639  NA  140  132*  K  4.1  4.1  CL  100  92*  CO2  26  22  BUN  43*  58*  CREATININE  6.29*  8.81*  GLUCOSE  115*  112*   Electrolytes Recent Labs  02/23/13  0605  02/24/13  0639  CALCIUM  7.7*  7.2*  PHOS   --   7.4*   Liver Enzymes Recent Labs     02/24/13  0639  ALBUMIN  2.2*   Glucose Recent Labs     02/23/13  2348  02/24/13  0417  02/24/13  1139  02/24/13  1951  02/25/13  0016  02/25/13  0416  GLUCAP  117*  114*  118*  146*  126*  130*    Recent Labs Lab 02/20/13 0654 02/21/13 1600 02/22/13 0524 02/23/13 0605 02/24/13 0639  PROCALCITON  --  2.23 1.82 1.59  --   WBC 12.7*  --  7.2 5.9 7.4    CXR: 9/2: negative   ASSESSMENT / PLAN:  Laryngeal cancer Trach status-->downsized to 6 on 9/2 Tolerating red-cap day and ATC night  P:   Dysphagia precautions Goal to restart XRT asap, although acknowledge this will potentially stall swallowing progress. We may need to balance these goals.  F/u [facility] trach clinic OCt 7 at 230 pm   PAF with RVR >> NSR Hx of HTN. P:  Cont PO  metoprolol  ESRD on HD - s/p AVF on 9/4 Now clear for HD center w/ trach capped, awaiting for the [facility] center to set up logistics of when, and also for final approval as he was initially turned down.   P:   Cont HD per nephro Spoke to Dr Conseco who will arrange out pt follow up  Will need out-pt follow up w/ vascular re: AVG  Chart indicates Dialysis at [facility] center Mon 9/8 at 10:30AM  Dysphagia - had MBS on 9/2, w/ marked improvement.  Hx of GERD Diarrhea -->improved  S/P G tube 8/29.   P:   Dysphagia 3 diet w/ clear liquid Will take in 1 can of Nepro TID for supplementation (can drink this if prefer) Flush PEG w/ free water  (55ml/ tap water three times a day @ d/c)  Lomotil PRN  Anemia of critical illness and chronic disease. Has had almost a gram drop in Hgb for last 48hrs s/p AVG. Getting blood per Nephrology  Note from Vascular notes small hematoma at AVG site.   Recent Labs Lab 02/22/13 0524 02/23/13 0605 02/24/13 0639  HGB 7.5* 7.2* 6.7*   P:  f/u CBC -transfuse for Hb < 7 => given 2u PC 9/5 Will need f/u cbc  aranesp per renal Hold Heparin  Vascular to re-eval site on rounds   DM-2   P:   Blood sugars 120-140 range Holding glyburide Resume SSI for glu > 180  Anxiety - can't sleep, added low dose xanax PRN 9/2 Pain - tolerable, mostly just annoyed by abd brace  Deconditioning - working w/ PT P:   low dose xanax Cont PT Discharge planning   [employer]. Kriste Basque, MD  02/25/2013, 7:50 AM  Pulmonary

## 2013-02-27 ENCOUNTER — Encounter (HOSPITAL_COMMUNITY): Payer: Self-pay | Admitting: Emergency Medicine

## 2013-02-27 ENCOUNTER — Emergency Department (EMERGENCY_DEPARTMENT_HOSPITAL)
Admission: EM | Admit: 2013-02-27 | Discharge: 2013-02-27 | Disposition: A | Discharge status: Home / Self Care | Payer: Medicare Other | Source: Home / Self Care | Attending: Emergency Medicine | Admitting: Emergency Medicine

## 2013-02-27 ENCOUNTER — Telehealth: Payer: Self-pay | Admitting: Acute Care

## 2013-02-27 ENCOUNTER — Ambulatory Visit
Admission: RE | Admit: 2013-02-27 | Discharge: 2013-02-27 | Disposition: A | Discharge status: Home / Self Care | Payer: Medicare Other | Source: Ambulatory Visit | Attending: Radiation Oncology | Admitting: Radiation Oncology

## 2013-02-27 ENCOUNTER — Emergency Department (HOSPITAL_COMMUNITY): Payer: Medicare Other

## 2013-02-27 ENCOUNTER — Emergency Department (HOSPITAL_COMMUNITY)
Admission: EM | Admit: 2013-02-27 | Discharge: 2013-02-27 | Disposition: A | Discharge status: Home / Self Care | Payer: Medicare Other | Attending: Emergency Medicine | Admitting: Emergency Medicine

## 2013-02-27 VITALS — BP 74/40 | HR 88 | Temp 97.5°F

## 2013-02-27 DIAGNOSIS — E119 Type 2 diabetes mellitus without complications: Secondary | ICD-10-CM | POA: Insufficient documentation

## 2013-02-27 DIAGNOSIS — Z9889 Other specified postprocedural states: Secondary | ICD-10-CM | POA: Insufficient documentation

## 2013-02-27 DIAGNOSIS — Z930 Tracheostomy status: Secondary | ICD-10-CM

## 2013-02-27 DIAGNOSIS — N189 Chronic kidney disease, unspecified: Secondary | ICD-10-CM | POA: Insufficient documentation

## 2013-02-27 DIAGNOSIS — Z8659 Personal history of other mental and behavioral disorders: Secondary | ICD-10-CM | POA: Insufficient documentation

## 2013-02-27 DIAGNOSIS — I120 Hypertensive chronic kidney disease with stage 5 chronic kidney disease or end stage renal disease: Secondary | ICD-10-CM | POA: Insufficient documentation

## 2013-02-27 DIAGNOSIS — Z992 Dependence on renal dialysis: Secondary | ICD-10-CM | POA: Insufficient documentation

## 2013-02-27 DIAGNOSIS — Z87891 Personal history of nicotine dependence: Secondary | ICD-10-CM | POA: Insufficient documentation

## 2013-02-27 DIAGNOSIS — Z880 Allergy status to penicillin: Secondary | ICD-10-CM | POA: Insufficient documentation

## 2013-02-27 DIAGNOSIS — J9503 Malfunction of tracheostomy stoma: Secondary | ICD-10-CM | POA: Insufficient documentation

## 2013-02-27 DIAGNOSIS — H9190 Unspecified hearing loss, unspecified ear: Secondary | ICD-10-CM | POA: Insufficient documentation

## 2013-02-27 DIAGNOSIS — Z79899 Other long term (current) drug therapy: Secondary | ICD-10-CM | POA: Insufficient documentation

## 2013-02-27 DIAGNOSIS — R0989 Other specified symptoms and signs involving the circulatory and respiratory systems: Secondary | ICD-10-CM | POA: Insufficient documentation

## 2013-02-27 DIAGNOSIS — J9500 Unspecified tracheostomy complication: Secondary | ICD-10-CM

## 2013-02-27 DIAGNOSIS — N185 Chronic kidney disease, stage 5: Secondary | ICD-10-CM | POA: Insufficient documentation

## 2013-02-27 DIAGNOSIS — Z7982 Long term (current) use of aspirin: Secondary | ICD-10-CM | POA: Insufficient documentation

## 2013-02-27 DIAGNOSIS — Z8639 Personal history of other endocrine, nutritional and metabolic disease: Secondary | ICD-10-CM | POA: Insufficient documentation

## 2013-02-27 DIAGNOSIS — R0602 Shortness of breath: Secondary | ICD-10-CM | POA: Insufficient documentation

## 2013-02-27 DIAGNOSIS — Z8669 Personal history of other diseases of the nervous system and sense organs: Secondary | ICD-10-CM | POA: Insufficient documentation

## 2013-02-27 DIAGNOSIS — C320 Malignant neoplasm of glottis: Secondary | ICD-10-CM | POA: Insufficient documentation

## 2013-02-27 DIAGNOSIS — I129 Hypertensive chronic kidney disease with stage 1 through stage 4 chronic kidney disease, or unspecified chronic kidney disease: Secondary | ICD-10-CM | POA: Insufficient documentation

## 2013-02-27 DIAGNOSIS — Z8529 Personal history of malignant neoplasm of other respiratory and intrathoracic organs: Secondary | ICD-10-CM | POA: Insufficient documentation

## 2013-02-27 DIAGNOSIS — R0609 Other forms of dyspnea: Secondary | ICD-10-CM | POA: Insufficient documentation

## 2013-02-27 DIAGNOSIS — M129 Arthropathy, unspecified: Secondary | ICD-10-CM | POA: Insufficient documentation

## 2013-02-27 DIAGNOSIS — K219 Gastro-esophageal reflux disease without esophagitis: Secondary | ICD-10-CM | POA: Insufficient documentation

## 2013-02-27 MED ORDER — FENTANYL CITRATE 0.05 MG/ML IJ SOLN
INTRAMUSCULAR | Status: AC
  Filled 2013-02-27: qty 2

## 2013-02-27 MED ORDER — MIDAZOLAM HCL 2 MG/2ML IJ SOLN
INTRAMUSCULAR | Status: AC
  Filled 2013-02-27: qty 2

## 2013-02-27 MED ORDER — FENTANYL CITRATE 0.05 MG/ML IJ SOLN
50.0000 ug | Freq: Once | INTRAMUSCULAR | Status: AC
  Administered 2013-02-27: 50 ug via INTRAVENOUS

## 2013-02-27 MED ORDER — MIDAZOLAM HCL 2 MG/2ML IJ SOLN
2.0000 mg | Freq: Once | INTRAMUSCULAR | Status: AC
  Administered 2013-02-27: 2 mg via INTRAVENOUS

## 2013-02-27 MED ORDER — ONDANSETRON HCL 8 MG PO TABS: 8.0000 mg | ORAL_TABLET | Freq: Three times a day (TID) | ORAL | Status: DC | PRN

## 2013-02-27 NOTE — ED Provider Notes (Signed)
CSN: [id number]     Arrival date & time 02/27/13  1808 History   First MD Initiated Contact with Patient 02/27/13 1922     Chief Complaint  Patient presents with  . Tracheostomy Tube Change   HPI States trach came out of place when he was sleeping. Ax4 NAD. No respiratory distress.  Past Medical History  Diagnosis Date  . Diabetes mellitus   . Hypertension   . GERD (gastroesophageal reflux disease)   . Fibromyalgia   . Chest pain   . Carotid artery occlusion   . Chronic kidney disease     - Stage 5- follwed by Dr Hyman Hopes  . Complication of anesthesia     agiation when he awaken  . Neuropathy   . Arthritis   . Sleep apnea   . H/O vitamin D deficiency   . Hearing loss   . Hypercholesteremia   . Myalgia and myositis   . 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 Cholecystectomy  1993  . Rotator cuff repair  07/06/2011    right  . Carotid endarterectomy Left   . Coolonoscopy    . Colonoscopy w/ biopsies and polypectomy      Hx: of  . Microlaryngoscopy with laser N/A 01/04/2013    Procedure: MICROLARYNGOSCOPY WITH BIOPSY/LASER;  Surgeon: Suzanna Obey, MD;  Location: [facility] OR;  Service: ENT;  Laterality: N/A;  . Av fistula placement Left 02/23/2013    Procedure: ARTERIOVENOUS (AV) FISTULA CREATION;  Surgeon: Fransisco Hertz, MD;  Location: [facility] OR;  Service: Vascular;  Laterality: Left;   Family History  Problem Relation Age of Onset  . Adopted: Yes   History  Substance Use Topics  . Smoking status: Former Smoker -- 2.00 packs/day for 31 years    Types: Cigarettes    Quit date: 06/22/1996  . Smokeless tobacco: Never Used  . Alcohol Use: No    Review of Systems  Unable to perform ROS: Acuity of condition    Allergies  Amoxicillin  Home Medications   Current Outpatient Rx  Name  Route  Sig  Dispense  Refill  . Alum & Mag Hydroxide-Simeth (MAGIC MOUTHWASH W/LIDOCAINE)  SOLN      1part nystatin,1part Maaloxplus,1part benadryl,3part 2%viscous lidocaine. Swallow 10 mL up to QID, before meals/bedtime   480 mL   5   . aspirin 81 MG tablet   Oral   Take 81 mg by mouth daily.          Marland Kitchen b complex vitamins capsule   Oral   Take 1 capsule by mouth daily.         . calcium acetate (PHOSLO) 667 MG capsule   Oral   Take 1 capsule (667 mg total) by mouth 3 (three) times daily with meals.   90 capsule   0   . esomeprazole (NEXIUM) 40 MG capsule   Oral   Take 40 mg by mouth daily before breakfast.         . magnesium oxide (MAG-OX) 400 MG tablet   Oral   Take 400 mg by mouth daily.         . metoprolol tartrate (LOPRESSOR) 25 MG tablet   Oral   Take 0.5 tablets (12.5 mg total) by mouth 2 (two) times daily.         . Omega-3 Fatty Acids (FISH OIL) 1000 MG CAPS  Oral   Take 2,000 mg by mouth 2 (two) times daily.         . ranitidine (ZANTAC) 300 MG capsule   Oral   Take 300 mg by mouth at bedtime.         . traMADol (ULTRAM) 50 MG tablet   Oral   Take 50 mg by mouth every 4 (four) hours as needed.          . venlafaxine (EFFEXOR) 37.5 MG tablet   Oral   Take 37.5 mg by mouth daily.          . Vitamin D, Ergocalciferol, (DRISDOL) 50000 UNITS CAPS   Oral   Take 50,000 Units by mouth every 14 (fourteen) days. Next dose is due august 30th         . ondansetron (ZOFRAN) 8 MG tablet   Oral   Take 1 tablet (8 mg total) by mouth every 8 (eight) hours as needed for nausea.   20 tablet   0    BP 111/56  Pulse 85  Temp(Src) 98.9 F (37.2 C) (Oral)  Resp 20  SpO2 100% Physical Exam  Nursing note and vitals reviewed. Constitutional: He is oriented to person, place, and time. He appears well-developed and well-nourished. No distress.  HENT:  Head: Normocephalic and atraumatic.  Eyes: Pupils are equal, round, and reactive to light.  Neck: Normal range of motion.    Cardiovascular: Normal rate and intact distal  pulses.   Pulmonary/Chest: No respiratory distress.  Abdominal: Normal appearance. He exhibits no distension.  Musculoskeletal: Normal range of motion.  Neurological: He is alert and oriented to person, place, and time. No cranial nerve deficit.  Skin: Skin is warm and dry. No rash noted.  Psychiatric: He has a normal mood and affect. His behavior is normal.    ED Course  Procedures (including critical care time) Labs Review Labs Reviewed - No data to display Imaging Review Dg Chest [facility] 1 View  02/27/2013   *RADIOLOGY REPORT*  Clinical Data: Replaced thoracostomy tube  PORTABLE CHEST - 1 VIEW  Comparison: Portable exam 0833 hours compared to 92,014  Findings: Tracheostomy tube projects over tracheal air column. Right jugular dual lumen central venous catheter stable with tip projecting over SVC. Upper-normal size of cardiac silhouette. Atherosclerotic calcification aorta. Pulmonary vascularity normal. Lungs clear. Bones unremarkable.  IMPRESSION: No acute abnormalities.   Original Report Authenticated By: Ulyses Southward, M.D.    MDM   1. Tracheostomy status     Pulmonary medicine(Zubelevitskiy) did bedside bronchoscopy and replaced his tracheostomy tube.  Since patient recovers from conscious sedation he can go home.  After treatment in the ED the patient feels back to baseline and wants to go home.  Nelia Shi, MD 02/27/13 2056

## 2013-02-27 NOTE — ED Notes (Signed)
Pt c/o of trach dislodgement today when he sneezed. States that trach was replaced today at [facility]. Dialysis today at noon. O2Sats 87%.

## 2013-02-27 NOTE — Progress Notes (Addendum)
Victor Lane here with his wife for weekly under treat visit in a wheelchair.  He is having pain in his throat that he rates at a 6/10.  This morning his trach fell out and he went the ER and had it replaced.  His wife is concerned that it has moved since it was put in because he has vomitied.  He also had dialysis today and his bp is 74/40.  He states that he feels tired and did take his bp medication this morning.  He is able to eat and he is drinking a supplement twice a day.  He also has a peg tube.  He reports that his mouth is dry.  On inspection, his trach does seem to be out father than usual.    Called Respiratory Therapist at [facility] and she was able to come and look at the trach.  She stated that [name] needs to go to the ED to have it replaced.  Notified Dr. Basilio Cairo.  Called report to ER.  Spoke to [employer], Press photographer.  Notified [name] that he does need to go the ED.  Asked if I could escort him but wife said she was going to pick up her car and bring him over to the ED herself.  Asked her to notify us if she needs anything.

## 2013-02-27 NOTE — Telephone Encounter (Signed)
Pt's trach dislodged. Placed 4 cuffless in ER, but only has 6 cuffless at home. Order placed for [facility] to provide trach supplies for 4 cuffless trach.  Anders Simmonds ACNP-BC [facility] Pulmonary/Critical Care Pager # [phone] OR # [phone] if no answer

## 2013-02-27 NOTE — ED Notes (Signed)
Pt blood pressure low MD aware pt has low b/ps on days of dialysis.

## 2013-02-27 NOTE — Progress Notes (Signed)
Rt saw pt at [facility] for trach displacement. Rt could not put trach back in place. Rt asked pt to come to the ED so his trach displacement could be addressed by the ED Dr. Clover Mealy was called to come and look at pt due to different opinions with ED Dr. and Elinor Parkinson. Pt trach is capped and he is own 2LPM [place]. Hr,rr,and sats stable at this time. Per CCM Dr.Z will be coming to look at pt. Report passed on to p.m. shift. Janina Mayo is a #4 Shiley a cathter will not pass down trach.

## 2013-02-27 NOTE — ED Notes (Signed)
Report received, assumed care.  

## 2013-02-27 NOTE — ED Notes (Signed)
Per EMS: Pt from home, pt had tracheostomy placed August 22nd. Pt states trach is not fully in place. States trach came out of place when he was sleeping. Ax4 NAD. No respiratory distress.

## 2013-02-27 NOTE — Progress Notes (Addendum)
Assisted MD with bronchoscope to re-insert trach. Janina Mayo was successfully inserted. BBS equal and clear. No complications noted throughout procedure.

## 2013-02-27 NOTE — Consult Note (Signed)
PULMONARY  / CRITICAL CARE MEDICINE  Name: Victor Lane MRN: [id number] DOB: 1946/02/26    ADMISSION DATE:  02/27/2013 CONSULTATION DATE:  02/27/2013   REFERRING MD :  EDP   CHIEF COMPLAINT:   Tracheostomy tube has become dislodged  BRIEF PATIENT DESCRIPTION:  This is a 67 year old male well known to our service who was just discharged from the hospital on 02/24/2013. The patient returns 02/27/2013 to the emergency room with a dislodged tracheotomy tube. 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 This patient is undergoing radiation therapy by Dr. Basilio Cairo. The patient was admitted between 02/10/2013 and 02/24/2013 for respiratory failure and progressive renal failure. The patient has been started now on hemodialysis. A tracheotomy was been placed. A PEG tube was also placed. The patient actually was scheduled for his first hemodialysis outpatient treatment this morning 02/27/2013. Over night the patient's trach accidentally fell out. The emergency room was unable to have this replaced and critical care was consult and. Dr. Tyson Alias has actually been the one to place the tracheotomy tube. He has a size 6 cuff was trach. The patient does have improved swallowing and has been eating a modified diet.   Currently the patient denies any dyspnea cough or difficulty breathing. He is phonating quite well with the tracheotomy tube out. Actually trach itself had been For some time. PAST MEDICAL HISTORY :  Past Medical History  Diagnosis Date  . Diabetes mellitus   . Hypertension   . GERD (gastroesophageal reflux disease)   . Fibromyalgia   . Chest pain   . Carotid artery occlusion   . Chronic kidney disease     - Stage 5- follwed by Dr Hyman Hopes  . Complication of anesthesia     agiation when he awaken  . Neuropathy   . Arthritis   . Sleep apnea   . H/O vitamin D  deficiency   . Hearing loss   . Hypercholesteremia   . Myalgia and myositis   . 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 Cholecystectomy  1993  . Rotator cuff repair  07/06/2011    right  . Carotid endarterectomy Left   . Coolonoscopy    . Colonoscopy w/ biopsies and polypectomy      Hx: of  . Microlaryngoscopy with laser N/A 01/04/2013    Procedure: MICROLARYNGOSCOPY WITH BIOPSY/LASER;  Surgeon: Suzanna Obey, MD;  Location: [facility] OR;  Service: ENT;  Laterality: N/A;  . Av fistula placement Left 02/23/2013    Procedure: ARTERIOVENOUS (AV) FISTULA CREATION;  Surgeon: Fransisco Hertz, MD;  Location: [facility] OR;  Service: Vascular;  Laterality: Left;   Prior to Admission medications   Medication Sig Start Date End Date Taking? Authorizing Provider  Alum & Mag Hydroxide-Simeth (MAGIC MOUTHWASH W/LIDOCAINE) SOLN 1part nystatin,1part Maaloxplus,1part benadryl,3part 2%viscous lidocaine. Swallow 10 mL up to QID, before meals/bedtime 02/06/13  Yes Lonie Peak, MD  aspirin 81 MG tablet Take 81 mg by mouth daily.    Yes Historical Provider, MD  b complex vitamins capsule Take 1 capsule by mouth daily.   Yes Historical Provider, MD  esomeprazole (NEXIUM) 40 MG capsule Take 40 mg by mouth daily before breakfast.   Yes Historical Provider, MD  magnesium oxide (MAG-OX) 400 MG tablet Take 400 mg by mouth daily.  Yes Historical Provider, MD  metoprolol tartrate (LOPRESSOR) 25 MG tablet Take 0.5 tablets (12.5 mg total) by mouth 2 (two) times daily. 02/25/13  Yes Tammy S Parrett, NP  Omega-3 Fatty Acids (FISH OIL) 1200 MG CPDR Take 2,400 mg by mouth.   Yes Historical Provider, MD  ranitidine (ZANTAC) 300 MG capsule Take 300 mg by mouth at bedtime.   Yes Historical Provider, MD  traMADol (ULTRAM) 50 MG tablet Take 100 mg by mouth every 4 (four) hours as needed.    Yes Historical Provider, MD  venlafaxine  (EFFEXOR) 37.5 MG tablet Take 37.5 mg by mouth 2 (two) times daily.   Yes Historical Provider, MD  Vitamin D, Ergocalciferol, (DRISDOL) 50000 UNITS CAPS Take 50,000 Units by mouth every 14 (fourteen) days. Next dose is due august 30th 02/05/13  Yes Historical Provider, MD  calcium acetate (PHOSLO) 667 MG capsule Take 1 capsule (667 mg total) by mouth 3 (three) times daily with meals. 02/25/13   Julio Sicks, NP   Allergies  Allergen Reactions  . Amoxicillin     Causes upset stomach    FAMILY HISTORY:  Family History  Problem Relation Age of Onset  . Adopted: Yes   SOCIAL HISTORY:  reports that he quit smoking about 16 years ago. His smoking use included Cigarettes. He has a 62 pack-year smoking history. He has never used smokeless tobacco. He reports that he does not drink alcohol or use illicit drugs.  REVIEW OF SYSTEMS:   Constitutional: Negative for fever, chills, weight loss, malaise/fatigue and diaphoresis.  HENT: Negative for hearing loss, ear pain, nosebleeds, congestion, sore throat, neck pain, tinnitus and ear discharge.   Eyes: Negative for blurred vision, double vision, photophobia, pain, discharge and redness.  Respiratory: Negative for cough, hemoptysis, sputum production, shortness of breath, wheezing and stridor.   Cardiovascular: Negative for chest pain, palpitations, orthopnea, claudication, leg swelling and PND.  Gastrointestinal: Negative for heartburn, nausea, vomiting, abdominal pain, diarrhea, constipation, blood in stool and melena.  Genitourinary: Negative for dysuria, urgency, frequency, hematuria and flank pain.  Musculoskeletal: Negative for myalgias, back pain, joint pain and falls.  Skin: Negative for itching and rash.  Neurological: Negative for dizziness, tingling, tremors, sensory change, speech change, focal weakness, seizures, loss of consciousness, weakness and headaches.  Endo/Heme/Allergies: Negative for environmental allergies and polydipsia. Does  not bruise/bleed easily.    VITAL SIGNS: Temp:  [98 F (36.7 C)] 98 F (36.7 C) (09/08 0652) Pulse Rate:  [87-95] 87 (09/08 0700) Resp:  [18] 18 (09/08 0700) BP: (111-112)/(48-55) 111/55 mmHg (09/08 0700) SpO2:  [99 %-100 %] 99 % (09/08 0700)  PHYSICAL EXAMINATION: General:  In no acute distress Neuro:  Alert and oriented HEENT:  Trach stoma has reduced in size with no active bleeding or secretions Neck:  No thyromegaly no jugular venous distention Cardiovascular:  Regular rate and rhythm normal S1-S2 no S3 or S4 no murmur rub heave or gallop Lungs:  Equal breath sounds no active wheezing Abdomen:  Soft nontender bowel sounds active Musculoskeletal:  Full range of motion no joint deformity Skin:  Clear   Recent Labs Lab 02/23/13 0605 02/24/13 0639 02/25/13 0905  NA 140 132* 134*  K 4.1 4.1 3.9  CL 100 92* 93*  CO2 26 22 26   BUN 43* 58* 33*  CREATININE 6.29* 8.81* 5.94*  GLUCOSE 115* 112* 127*    Recent Labs Lab 02/23/13 0605 02/24/13 0639 02/25/13 0905  HGB 7.2* 6.7* 8.5*  HCT 22.4* 20.0* 25.8*  WBC  5.9 7.4 8.0  PLT 249 272 208   Dg Chest Port 1 View  02/27/2013   *RADIOLOGY REPORT*  Clinical Data: Replaced thoracostomy tube  PORTABLE CHEST - 1 VIEW  Comparison: Portable exam 0833 hours compared to 92,014  Findings: Tracheostomy tube projects over tracheal air column. Right jugular dual lumen central venous catheter stable with tip projecting over SVC. Upper-normal size of cardiac silhouette. Atherosclerotic calcification aorta. Pulmonary vascularity normal. Lungs clear. Bones unremarkable.  IMPRESSION: No acute abnormalities.   Original Report Authenticated By: Ulyses Southward, M.D.    ASSESSMENT / PLAN:  #1 tracheotomy tube dislodgment  Plan The patient had a #4 cuffless  Shiley tracheotomy tube placed into the stoma without difficulty and the chest x-ray shows that it is in good position When radiation treatment is complete this patient should easily be able to  be decannulated  #2 vocal cord squamous cell carcinoma undergoing radiation therapy  Plan Continue radiation therapy  #3 end-stage renal disease  Plan Keep outpatient dialysis appointment on 02/27/2013   The patient may be discharged from the emergency department and no additional workup or treatment is necessary from pulmonary standpoint   Caryl Bis  [phone]  Cell  [phone]  If no response or cell goes to voicemail, call beeper [phone]  Pulmonary and Critical Care Medicine [facility] Pager: [phone]  02/27/2013, 8:41 AM

## 2013-02-27 NOTE — Telephone Encounter (Signed)
Order given to melissa [email or web address] ° °

## 2013-02-27 NOTE — ED Notes (Addendum)
PCCM MD, Dr. Delford Field at bedside. Trach placed by MD with RT present. Tolerated well.

## 2013-02-27 NOTE — ED Provider Notes (Signed)
CSN: [id number]     Arrival date & time 02/27/13  4098 History   First MD Initiated Contact with Patient 02/27/13 [phone]     Chief Complaint  Patient presents with  . Tracheostomy Tube Change   (Consider location/radiation/quality/duration/timing/severity/associated sxs/prior Treatment) The history is provided by the patient.  patient has a tracheostomy.it has been in since the end of August he woke up this morning with up and his neck. It was up at his neck. He is having mild difficulty breathing. No fevers. He had a #6 uncuffed tube in.he was placed by Dr. Tyson Alias around 2 weeks ago.  Past Medical History  Diagnosis Date  . Diabetes mellitus   . Hypertension   . GERD (gastroesophageal reflux disease)   . Fibromyalgia   . Chest pain   . Carotid artery occlusion   . Chronic kidney disease     - Stage 5- follwed by Dr Hyman Hopes  . Complication of anesthesia     agiation when he awaken  . Neuropathy   . Arthritis   . Sleep apnea   . H/O vitamin D deficiency   . Hearing loss   . Hypercholesteremia   . Myalgia and myositis   . 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 Cholecystectomy  1993  . Rotator cuff repair  07/06/2011    right  . Carotid endarterectomy Left   . Coolonoscopy    . Colonoscopy w/ biopsies and polypectomy      Hx: of  . Microlaryngoscopy with laser N/A 01/04/2013    Procedure: MICROLARYNGOSCOPY WITH BIOPSY/LASER;  Surgeon: Suzanna Obey, MD;  Location: [facility] OR;  Service: ENT;  Laterality: N/A;  . Av fistula placement Left 02/23/2013    Procedure: ARTERIOVENOUS (AV) FISTULA CREATION;  Surgeon: Fransisco Hertz, MD;  Location[facility] OR;  Service: Vascular;  Laterality: Left;   Family History  Problem Relation Age of Onset  . Adopted: Yes   History  Substance Use Topics  . Smoking status: Former Smoker -- 2.00 packs/day for 31 years    Types: Cigarettes    Quit  date: 06/22/1996  . Smokeless tobacco: Never Used  . Alcohol Use: No    Review of Systems  Constitutional: Negative for fatigue.  HENT: Negative for neck pain.   Respiratory: Positive for shortness of breath.   Cardiovascular: Negative for chest pain.  Gastrointestinal: Negative for abdominal pain.    Allergies  Amoxicillin  Home Medications   Current Outpatient Rx  Name  Route  Sig  Dispense  Refill  . Alum & Mag Hydroxide-Simeth (MAGIC MOUTHWASH W/LIDOCAINE) SOLN      1part nystatin,1part Maaloxplus,1part benadryl,3part 2%viscous lidocaine. Swallow 10 mL up to QID, before meals/bedtime   480 mL   5   . aspirin 81 MG tablet   Oral   Take 81 mg by mouth daily.          Marland Kitchen b complex vitamins capsule   Oral   Take 1 capsule by mouth daily.         Marland Kitchen esomeprazole (NEXIUM) 40 MG capsule   Oral   Take 40 mg by mouth daily before breakfast.         . magnesium oxide (MAG-OX) 400 MG tablet   Oral   Take 400 mg by mouth daily.         . metoprolol tartrate (LOPRESSOR)  25 MG tablet   Oral   Take 0.5 tablets (12.5 mg total) by mouth 2 (two) times daily.         . ranitidine (ZANTAC) 300 MG capsule   Oral   Take 300 mg by mouth at bedtime.         . traMADol (ULTRAM) 50 MG tablet   Oral   Take 50 mg by mouth every 4 (four) hours as needed.          . venlafaxine (EFFEXOR) 37.5 MG tablet   Oral   Take 37.5 mg by mouth daily.          . Vitamin D, Ergocalciferol, (DRISDOL) 50000 UNITS CAPS   Oral   Take 50,000 Units by mouth every 14 (fourteen) days. Next dose is due august 30th         . calcium acetate (PHOSLO) 667 MG capsule   Oral   Take 1 capsule (667 mg total) by mouth 3 (three) times daily with meals.   90 capsule   0   . Omega-3 Fatty Acids (FISH OIL) 1000 MG CAPS   Oral   Take 2,000 mg by mouth 2 (two) times daily.         . ondansetron (ZOFRAN) 8 MG tablet   Oral   Take 1 tablet (8 mg total) by mouth every 8 (eight) hours  as needed for nausea.   20 tablet   0    BP 97/55  Pulse 80  Temp(Src) 98 F (36.7 C) (Oral)  Resp 20  SpO2 94% Physical Exam  Constitutional: He appears well-developed and well-nourished.  HENT:  Head: Normocephalic.  Neck:  Tracheostomy on neck. No bleeding. No stridor  Pulmonary/Chest: Effort normal and breath sounds normal.  Musculoskeletal: Normal range of motion.    ED Course  Procedures (including critical care time) Labs Review Labs Reviewed - No data to display Imaging Review No results found.  MDM   1. Tracheostomy complication    Patient with tracheostomy complication. His tube came out at home. Unable to replace by respiratory. Replaced by pulmonary.    Juliet Rude. Rubin Payor, MD 03/02/13 0020

## 2013-02-27 NOTE — ED Notes (Signed)
Dr Radford Pax and charge Diane at bedside. Informed of pt blood pressure and O2 Sats. Pt placed on 2L [place], O2Sats 95%. Respiratory at bedside.

## 2013-02-27 NOTE — ED Notes (Signed)
RT unable replace cuffless trach tube. ED MD aware. Pt NAD. Resp e/u, no distress

## 2013-02-27 NOTE — Procedures (Signed)
Name:  Victor Lane MRN:  [id number] DOB:  [birth year]  PROCEDURE NOTE  Procedure(s): Flexible bronchoscopy [phone]) Tracheostomy tube change (96295)  Indications:  Dislodgement of the endotracheal tube, EDP unable to place back    Consent:  Procedure, benefits, risks and alternatives discussed.  Questions answered.  Consent obtained.  Anesthesia:  Conscious sedation with Versed 2 mg IV x 1 and Fentanyl 50 mg IV x 1  Procedure summary:  Appropriate equipment was assembled.  The patient was identified as Victor Lane.  Safety timeout was performed. The patient was placed supine with neck extended and conscious sedation was administered.  Flexible video bronchoscope was lubricated and inserted through the stoma.  Tracheostomy tract appeared patent, except there was granulation tissue noted distally.  Uncuffed Shiley # 4 tracheostomy tube was inserted over the bronchoscope and manipulated around the area of stenosis into proper position.  The rest of the airway distally to the tracheostomy tube appeared normal.  Specimens sent:  None.  Complications:  No immediate complications were noted.  Hemodynamic parameters and oxygenation remained stable throughout the procedure.  Estimated blood loss:  Less then 5 mL.  Orlean Bradford, M.D. Pulmonary and Critical Care Medicine [facility] Cell: [phone]  02/27/2013, 8:04 PM

## 2013-02-27 NOTE — Progress Notes (Signed)
   Weekly Management Note:  Outpatient Current Dose:  38.25 Gy  Projected Dose: 63 Gy   Narrative:  The patient presents for routine under treatment assessment.  CBCT/MVCT images/Port film x-rays were reviewed.  The chart was checked. BP low after dialysis today. BP was taken over pt's sweatshirt and he refused repeat check by nursing.  He is on tramadol for pain, no longer on Fentanyl.  His dialysis is MWF. Trach fell out today -> went to ER -> it was replaced -->Vomited today and trach moved out a little again.  Attributed vomited to PEG flush. Taking food by mouth, some nutrition by PEG.  Says he has no anti nausea meds.  Physical Findings:  temperature is 97.5 F (36.4 C). His blood pressure is 74/40 and his pulse is 88. His oxygen saturation is 96%.  No skin irritation thus far.  Trach/collar to be further assessed by respiratory therapy today.  Janina Mayo may be a bit loose/falling out.  Impression:  The patient is tolerating radiotherapy.  Plan:  Continue radiotherapy as planned. Respiratory therapists to assess trach now. Wife would like to know if trach can be removed  -- or when - at soonest - it can come out.  Advised her to discuss with Dr. Jearld Fenton.  Also advised her to discuss ideal pain medication regimen with nephrology - so we can safely escalate from Tramadol if/when pain worsens. Will call in Rx for Zofran to have PRN.  ________________________________   Lonie Peak, M.D.

## 2013-02-28 ENCOUNTER — Telehealth: Payer: Self-pay | Admitting: Radiation Oncology

## 2013-02-28 ENCOUNTER — Ambulatory Visit
Admission: RE | Admit: 2013-02-28 | Discharge: 2013-02-28 | Disposition: A | Discharge status: Home / Self Care | Payer: Medicare Other | Source: Ambulatory Visit | Attending: Radiation Oncology | Admitting: Radiation Oncology

## 2013-02-28 NOTE — Telephone Encounter (Signed)
Faxed PUT 02/27/13 to [facility] per [facility], fax [phone].  Received confirmation.

## 2013-03-01 ENCOUNTER — Ambulatory Visit
Admission: RE | Admit: 2013-03-01 | Discharge: 2013-03-01 | Disposition: A | Discharge status: Home / Self Care | Payer: Medicare Other | Source: Ambulatory Visit | Attending: Radiation Oncology | Admitting: Radiation Oncology

## 2013-03-02 ENCOUNTER — Ambulatory Visit
Admission: RE | Admit: 2013-03-02 | Discharge: 2013-03-02 | Disposition: A | Discharge status: Home / Self Care | Payer: Medicare Other | Source: Ambulatory Visit | Attending: Radiation Oncology | Admitting: Radiation Oncology

## 2013-03-03 ENCOUNTER — Ambulatory Visit: Payer: Medicare Other

## 2013-03-03 ENCOUNTER — Ambulatory Visit
Admission: RE | Admit: 2013-03-03 | Discharge: 2013-03-03 | Disposition: A | Discharge status: Home / Self Care | Payer: Medicare Other | Source: Ambulatory Visit | Attending: Radiation Oncology | Admitting: Radiation Oncology

## 2013-03-06 ENCOUNTER — Ambulatory Visit
Admission: RE | Admit: 2013-03-06 | Discharge: 2013-03-06 | Disposition: A | Discharge status: Home / Self Care | Payer: Medicare Other | Source: Ambulatory Visit | Attending: Radiation Oncology | Admitting: Radiation Oncology

## 2013-03-06 ENCOUNTER — Encounter: Payer: Self-pay | Admitting: Radiation Oncology

## 2013-03-06 ENCOUNTER — Ambulatory Visit: Payer: Medicare Other

## 2013-03-06 ENCOUNTER — Encounter: Payer: Self-pay | Admitting: *Deleted

## 2013-03-06 VITALS — BP 119/44 | HR 85 | Temp 99.2°F | Ht 70.0 in | Wt 180.8 lb

## 2013-03-06 DIAGNOSIS — C320 Malignant neoplasm of glottis: Secondary | ICD-10-CM

## 2013-03-06 DIAGNOSIS — N19 Unspecified kidney failure: Secondary | ICD-10-CM

## 2013-03-06 MED ORDER — OXYCODONE HCL 5 MG/5ML PO SOLN: 5.0000 mg | ORAL | Status: DC | PRN

## 2013-03-06 NOTE — Progress Notes (Signed)
Mrs Kurtenbach call this weekend with a message for Young Berry - I called her this AM - these are her concerns re: he husband:  Throat very sore still taking all nutrition by mouth  Eating very slowly  Somnolent  Pain medication - increased Tramadol to "2"  - but still in pain  Fentanyl - tried with approval from nephrologist, "but put him in [place]."  She says he seems similar to how he was before he was admitted to hospital.  Has dialysis today.  I answered other questions re; his Cancer - it is STAGE 1, the grade was not in path report, the exact size of the lesions was not reported, the xrays we do on the [facility] are just of bony anatomy for set up (do not show detail of his cancer).  She is wondering what to do, and is concerned.  I recommended they talk to his nephrologist at his dialysis this AM - make sure his labs are ok and that he doesn't need admission again.  Also recommended she ask nephrologist as to whether there is an alternative long acting pain med that is relatively safe to take.  She should also use his PEG more if swallowing is too painful for him.  She appreciated this advice and will call if more questions arise.  I will see them later this PM.  -----------------------------------  Lonie Peak, MD

## 2013-03-06 NOTE — Progress Notes (Signed)
Victor Lane here in a wheelchair with his wife for weekly under treat visit.  He has had 22 fractions to his larynx.  He does have generalized pain that he is rating at a 5/10.  He reports that it can get up to an 8/10.  He has an occasional cough that is worse at night.  He is bringing up clear mucus.  He does have pain in his throat with swallowing.  He is trying to eat and is able to liquids and soft solids.  His trach is intact.  He does have redness around his trach with a blister above it.  He has been using biafine cream twice a day.  He had dialysis today.  He is very fatigued.

## 2013-03-06 NOTE — Progress Notes (Signed)
[name] left a message on my phone Sunday saying they "have to make some decisions" and she needs answers to the following questions re: Glynn's diagnosis: 1. What stage is his cancer? 2. What size is the polyp(s) removed? 3. What is the tumor grade? 4. What do Xrays show? She indicated he is again declining, much like he was prior to his admission to [facility] a couple of weeks ago.   I indicated I wd relay information to Dr. Basilio Cairo (wh/ I did via In Basket and verbally).  Young Berry, RN, BSN, [facility] Head & Neck Oncology Navigator [phone]

## 2013-03-06 NOTE — Progress Notes (Signed)
   Weekly Management Note:  outpatient Current Dose:  49.5 Gy  Projected Dose: 63 Gy   Narrative:  The patient presents for routine under treatment assessment.  CBCT/MVCT images/Port film x-rays were reviewed.  The chart was checked. He had dialysis today. Reports generalized pain that he is rating at a 5/10. He reports that it can get up to an 8/10. He has an occasional cough that is worse at night. He is bringing up clear mucus. He does have pain in his throat with swallowing. He is trying to eat and is able to liquids and soft solids. His trach is intact. He does have redness around his trach with a blister above it. He has been using biafine cream twice a day.  He is very fatigued.  Nephrologist was not available today at dialysis (Dr. Eliott Nine [facility] [address]) and he is only eligible for labs q month, per wife.    Physical Findings:  height is 5\' 10"  (1.778 m) and weight is 180 lb 12.8 oz (82.01 kg). His temperature is 99.2 F (37.3 C). His blood pressure is 119/44 and his pulse is 85. His oxygen saturation is 94%.   Skin is erythematous over anterior neck, small open sore  under trach collar outside RT field.  Impression:  The patient is tolerating radiotherapy.  Plan:  Continue radiotherapy as planned. Supplement PO intake with PEG if throat is too sore.  Will order CBC, BMP tomorrow as wife is concerned pt is declining (see my prior note).  Rx'd oxycodone susp for pain.  Neosporin over sore on neck.  I have considered giving him BID fractionation once or twice to compensate for missed treatments, but patient is already having significant difficulty with dialysis and RT as planned and I do not think it is realistic for him to tolerate BID treatment in his current state.  ________________________________   Lonie Peak, M.D.

## 2013-03-07 ENCOUNTER — Ambulatory Visit: Payer: Medicare Other

## 2013-03-07 ENCOUNTER — Ambulatory Visit
Admission: RE | Admit: 2013-03-07 | Discharge: 2013-03-07 | Disposition: A | Discharge status: Home / Self Care | Payer: Medicare Other | Source: Ambulatory Visit | Attending: Radiation Oncology | Admitting: Radiation Oncology

## 2013-03-07 ENCOUNTER — Telehealth: Payer: Self-pay | Admitting: Dietician

## 2013-03-07 ENCOUNTER — Other Ambulatory Visit: Payer: Self-pay | Admitting: Radiation Oncology

## 2013-03-07 DIAGNOSIS — C320 Malignant neoplasm of glottis: Secondary | ICD-10-CM

## 2013-03-07 DIAGNOSIS — N189 Chronic kidney disease, unspecified: Secondary | ICD-10-CM

## 2013-03-08 ENCOUNTER — Ambulatory Visit
Admission: RE | Admit: 2013-03-08 | Discharge: 2013-03-08 | Disposition: A | Discharge status: Home / Self Care | Payer: Medicare Other | Source: Ambulatory Visit | Attending: Radiation Oncology | Admitting: Radiation Oncology

## 2013-03-09 ENCOUNTER — Ambulatory Visit: Payer: Medicare Other

## 2013-03-09 ENCOUNTER — Ambulatory Visit
Admission: RE | Admit: 2013-03-09 | Discharge: 2013-03-09 | Disposition: A | Discharge status: Home / Self Care | Payer: Medicare Other | Source: Ambulatory Visit | Attending: Radiation Oncology | Admitting: Radiation Oncology

## 2013-03-09 ENCOUNTER — Ambulatory Visit: Payer: Medicare Other | Admitting: Lab

## 2013-03-09 DIAGNOSIS — C320 Malignant neoplasm of glottis: Secondary | ICD-10-CM

## 2013-03-09 DIAGNOSIS — N19 Unspecified kidney failure: Secondary | ICD-10-CM

## 2013-03-09 LAB — CBC WITH DIFFERENTIAL/PLATELET
Basophils Absolute: 0.1 10*3/uL (ref 0.0–0.1)
Eosinophils Absolute: 0.1 10*3/uL (ref 0.0–0.5)
HGB: 7.7 g/dL — ABNORMAL LOW (ref 13.0–17.1)
LYMPH%: 13.8 % — ABNORMAL LOW (ref 14.0–49.0)
MCV: 90.2 fL (ref 79.3–98.0)
MONO#: 0.7 10*3/uL (ref 0.1–0.9)
MONO%: 8.1 % (ref 0.0–14.0)
NEUT#: 6.4 10*3/uL (ref 1.5–6.5)
Platelets: 392 10*3/uL (ref 140–400)
RDW: 15.7 % — ABNORMAL HIGH (ref 11.0–14.6)
WBC: 8.5 10*3/uL (ref 4.0–10.3)

## 2013-03-09 LAB — BASIC METABOLIC PANEL (CC13)
CO2: 30 mEq/L — ABNORMAL HIGH (ref 22–29)
Glucose: 110 mg/dl (ref 70–140)
Potassium: 4.8 mEq/L (ref 3.5–5.1)
Sodium: 141 mEq/L (ref 136–145)

## 2013-03-10 ENCOUNTER — Ambulatory Visit
Admission: RE | Admit: 2013-03-10 | Discharge: 2013-03-10 | Disposition: A | Discharge status: Home / Self Care | Payer: Medicare Other | Source: Ambulatory Visit | Attending: Radiation Oncology | Admitting: Radiation Oncology

## 2013-03-10 ENCOUNTER — Other Ambulatory Visit: Payer: Self-pay | Admitting: Radiation Oncology

## 2013-03-10 ENCOUNTER — Ambulatory Visit: Payer: Medicare Other

## 2013-03-10 ENCOUNTER — Encounter: Payer: Self-pay | Admitting: *Deleted

## 2013-03-10 DIAGNOSIS — C320 Malignant neoplasm of glottis: Secondary | ICD-10-CM

## 2013-03-10 DIAGNOSIS — N186 End stage renal disease: Secondary | ICD-10-CM

## 2013-03-10 NOTE — Progress Notes (Signed)
Dr. Basilio Cairo made referral to nutrition management at [facility] to start enteral nutrition via peg.  The orders were placed at 12:20pm today, but they were also faxed at 3:24 pm because Nutrition Management did not see the orders.  Current labs were also sent to notify Montine Circle of [address] Creatinine and BUN levels from 03/09/13.  His levels are trending down

## 2013-03-10 NOTE — Progress Notes (Signed)
Met with patient and his wife during scheduled RT.  Wife indicates he is improving some but feeling fatigued which I reminded was to be expected.  Will continue to navigate as L2 (established) patient.  Young Berry, RN, BSN, [facility] Head & Neck Oncology Navigator [phone]

## 2013-03-13 ENCOUNTER — Ambulatory Visit: Payer: Medicare Other

## 2013-03-13 ENCOUNTER — Ambulatory Visit
Admission: RE | Admit: 2013-03-13 | Discharge: 2013-03-13 | Disposition: A | Discharge status: Home / Self Care | Payer: Medicare Other | Source: Ambulatory Visit | Attending: Radiation Oncology | Admitting: Radiation Oncology

## 2013-03-13 ENCOUNTER — Encounter: Payer: Self-pay | Admitting: Radiation Oncology

## 2013-03-13 VITALS — BP 115/65 | HR 85 | Temp 98.3°F | Ht 70.0 in | Wt 181.0 lb

## 2013-03-13 DIAGNOSIS — C320 Malignant neoplasm of glottis: Secondary | ICD-10-CM

## 2013-03-13 MED ORDER — SUCRALFATE 1 G PO TABS: ORAL_TABLET | ORAL | Status: DC

## 2013-03-13 NOTE — Progress Notes (Signed)
   Weekly Management Note:  outpatient Current Dose:  60.75 Gy  Projected Dose: 63 Gy   Narrative:  The patient presents for routine under treatment assessment.  CBCT/MVCT images/Port film x-rays were reviewed.  The chart was checked.  Sore all over body.  Dry coughing - no SOB - feels it arises from scratchy throat.   Oxycodone tolerated for pain.  Using MMW at meals.  All food by mouth.  Physical Findings:  height is 5\' 10"  (1.778 m) and weight is 181 lb (82.101 kg). His temperature is 98.3 F (36.8 C). His blood pressure is 115/65 and his pulse is 85.   Skin erythematous over neck. Trach in place.  CBC    Component Value Date/Time   WBC 8.5 03/09/2013 1520   WBC 8.0 02/25/2013 0905   RBC 2.61* 03/09/2013 1520   RBC 2.85* 02/25/2013 0905   RBC 3.71* 02/10/2013 1326   HGB 7.7* 03/09/2013 1520   HGB 8.5* 02/25/2013 0905   HCT 23.5* 03/09/2013 1520   HCT 25.8* 02/25/2013 0905   PLT 392 03/09/2013 1520   PLT 208 02/25/2013 0905   MCV 90.2 03/09/2013 1520   MCV 90.5 02/25/2013 0905   MCH 29.6 03/09/2013 1520   MCH 29.8 02/25/2013 0905   MCHC 32.9 03/09/2013 1520   MCHC 32.9 02/25/2013 0905   RDW 15.7* 03/09/2013 1520   RDW 16.6* 02/25/2013 0905   LYMPHSABS 1.2 03/09/2013 1520   LYMPHSABS 0.8 02/13/2013 0420   MONOABS 0.7 03/09/2013 1520   MONOABS 0.5 02/13/2013 0420   EOSABS 0.1 03/09/2013 1520   EOSABS 0.3 02/13/2013 0420   BASOSABS 0.1 03/09/2013 1520   BASOSABS 0.0 02/13/2013 0420     CMP     Component Value Date/Time   NA 141 03/09/2013 1520   NA 134* 02/25/2013 0905   K 4.8 03/09/2013 1520   K 3.9 02/25/2013 0905   CL 93* 02/25/2013 0905   CO2 30* 03/09/2013 1520   CO2 26 02/25/2013 0905   GLUCOSE 110 03/09/2013 1520   GLUCOSE 127* 02/25/2013 0905   BUN 26.9* 03/09/2013 1520   BUN 33* 02/25/2013 0905   CREATININE 5.8 Repeated and Verified* 03/09/2013 1520   CREATININE 5.94* 02/25/2013 0905   CALCIUM 8.8 03/09/2013 1520   CALCIUM 7.6* 02/25/2013 0905   PROT 5.0* 02/13/2013 0420   ALBUMIN 2.2* 02/24/2013 0639   AST  58* 02/13/2013 0420   ALT 10 02/13/2013 0420   ALKPHOS 60 02/13/2013 0420   BILITOT 0.2* 02/13/2013 0420   GFRNONAA 9* 02/25/2013 0905   GFRAA 10* 02/25/2013 0905     Impression:  The patient is tolerating radiotherapy.   Plan:  Continue radiotherapy as planned. Carafate for scratchy throat.  Trach removal pending discussion with DR. Jearld Fenton.  They will call him. PEG can be removed if weight stable at f/u in 2wks and still taking all nutrition by mouth.  Labs discussed - Hgb 7.7 - Creat improving mildly. They anticipate labs to be checked at dialysis again this week.  See in 2wks, sooner if needed.  -----------------------------------  Lonie Peak, MD

## 2013-03-13 NOTE — Progress Notes (Signed)
Mr Victor Lane has received 27 fractions to the larynx.  He is very fatigued today since he had dialysis.  He reports that he hurts "all over" and grades his pain as a level 4 on a scale of 0-10.  Trach intact.    He has a dry hacking cough the past 2 nights and c/o sore throat.  Mouth is clean and clear and do not detect any redness at the back of his throat.  Given Biafine.

## 2013-03-14 ENCOUNTER — Ambulatory Visit: Payer: Medicare Other

## 2013-03-14 ENCOUNTER — Encounter: Payer: Self-pay | Admitting: *Deleted

## 2013-03-14 ENCOUNTER — Encounter: Payer: Self-pay | Admitting: Radiation Oncology

## 2013-03-14 ENCOUNTER — Ambulatory Visit
Admission: RE | Admit: 2013-03-14 | Discharge: 2013-03-14 | Disposition: A | Discharge status: Home / Self Care | Payer: Medicare Other | Source: Ambulatory Visit | Attending: Radiation Oncology | Admitting: Radiation Oncology

## 2013-03-14 NOTE — Progress Notes (Signed)
Met with pt and his wife during final RT to offer support and to celebrate end of radiation treatment.  I explained that my role as navigator will continue for several more months and that I will be calling and/or joining them during follow-up visits.  Pt and his wife expressed appreciation.  Rick Diehl, RN, BSN, [facility] Head & Neck Oncology Navigator [phone]   

## 2013-03-19 NOTE — Progress Notes (Signed)
  Radiation Oncology         [phone] ________________________________  Name: Victor Lane MRN: [id number]  Date: 03/14/2013  DOB: October 11, 1945  End of Treatment Note  Diagnosis: T1bN0M0 Glottic Squamous Cell Carcinoma   Indication for treatment:  curative       Radiation treatment dates:   01/24/2013-03/14/2013  Site/dose:   Larynx/glottis / 63 Gy in 28 fractions  Beams/energy:   Laterals / photons  Narrative: The patient tolerated radiation treatment with difficulties compounded by renal failure. During treatments, he was admitted with confusion, dyspnea, hypoxia, hyperkalemia and kidney failure and started emergent hemodialysis. He was in ICU, intubated, and eventually had a tracheostomy placed.  Due to his other medical issues, he had a break from treatment between 8-21 and 02-22-13. He then completed treatment without any major issues.  Twice a day treatments were not given to compensate for his treatment break due to the patient's fragile status and challenges in balancing RT appointments with outpatient dialysis.  Plan: The patient has completed radiation treatment. The patient will return to radiation oncology clinic for routine followup in half a month. I advised them to call or return sooner if they have any questions or concerns related to their recovery or treatment.  -----------------------------------  Lonie Peak, MD

## 2013-03-21 NOTE — Addendum Note (Signed)
Encounter addended by: Lonie Peak, MD on: 03/21/2013  8:26 AM<BR>     Documentation filed: Notes Section

## 2013-03-22 ENCOUNTER — Telehealth: Payer: Self-pay | Admitting: *Deleted

## 2013-03-22 NOTE — Telephone Encounter (Signed)
Called pt as check-in s/p completion of RT.  Spoke with spouse who indicated pt is eating/drinking some and still using PEG BID for fluid and nutritional supplementation r/t throat soreness. She also stated that pt continues to feel fatigued.  I reminded her that these are expected RT SEs which she indicated awareness.  Will continue to navigate as L3 (treatments completed) patient.  Young Berry, RN, BSN, [facility] Head & Neck Oncology Navigator [phone]

## 2013-03-24 ENCOUNTER — Encounter: Payer: Medicare Other | Admitting: Vascular Surgery

## 2013-03-24 ENCOUNTER — Other Ambulatory Visit (HOSPITAL_COMMUNITY): Payer: Medicare Other

## 2013-03-28 ENCOUNTER — Ambulatory Visit (HOSPITAL_COMMUNITY): Payer: Medicare Other

## 2013-03-28 ENCOUNTER — Encounter: Payer: Self-pay | Admitting: Radiation Oncology

## 2013-03-28 ENCOUNTER — Encounter: Payer: Self-pay | Admitting: *Deleted

## 2013-03-28 ENCOUNTER — Ambulatory Visit: Payer: Medicare Other | Admitting: Radiation Oncology

## 2013-03-28 ENCOUNTER — Telehealth: Payer: Self-pay

## 2013-03-28 ENCOUNTER — Ambulatory Visit
Admission: RE | Admit: 2013-03-28 | Discharge: 2013-03-28 | Disposition: A | Discharge status: Home / Self Care | Payer: Medicare Other | Source: Ambulatory Visit | Attending: Radiation Oncology | Admitting: Radiation Oncology

## 2013-03-28 ENCOUNTER — Other Ambulatory Visit: Payer: Self-pay | Admitting: Lab

## 2013-03-28 VITALS — BP 157/69 | HR 83 | Temp 98.2°F | Ht 70.0 in | Wt 179.8 lb

## 2013-03-28 DIAGNOSIS — T85848D Pain due to other internal prosthetic devices, implants and grafts, subsequent encounter: Secondary | ICD-10-CM

## 2013-03-28 DIAGNOSIS — C320 Malignant neoplasm of glottis: Secondary | ICD-10-CM

## 2013-03-28 HISTORY — DX: Personal history of irradiation: Z92.3

## 2013-03-28 NOTE — Telephone Encounter (Signed)
lmomtcb x1 for [facility] nurse. We have not seen this pt in the [facility] hospital back in September. This should go to PCP

## 2013-03-28 NOTE — Progress Notes (Signed)
Radiation Oncology         [phone] ________________________________  Name: Victor Lane MRN: [id number]  Date: 03/28/2013  DOB: 1946/03/21  Follow-Up Visit Note  CC: Lenora Boys, MD  Suzanna Obey, MD  Diagnosis and Prior Radiotherapy:   T1bN0M0 Glottic Squamous Cell Carcinoma  Indication for treatment: curative  Radiation treatment dates: 01/24/2013-03/14/2013  Site/dose: Larynx/glottis / 63 Gy in 28 fractions  Narrative:  The patient returns today for routine follow-up.  Had his Janina Mayo removed today at 12:30pm.  He denies any pain or sore throat presently. Eating soft foods and was able to eat a lamb chop this weekend. He uses PEG tube intermittently - has some pain at ostomy.  Wife notes he sometimes acts bizarrely at night - seems half asleep, half awake, and disoriented. But this is improving slowly.  They report that they 8 days following RT were tough in terms of throat pain, but then this started to improve rapidly. Tolerating dialysis well, color in skin better.  On iron supplements for anemia.                               ALLERGIES:  is allergic to amoxicillin and fentanyl.  Meds: Current Outpatient Prescriptions  Medication Sig Dispense Refill  . aspirin 81 MG tablet Take 81 mg by mouth daily.       Marland Kitchen b complex vitamins capsule Take 1 capsule by mouth daily.      . calcium acetate (PHOSLO) 667 MG capsule Take 1 capsule (667 mg total) by mouth 3 (three) times daily with meals.  90 capsule  0  . esomeprazole (NEXIUM) 40 MG capsule Take 40 mg by mouth daily before breakfast.      . metoprolol tartrate (LOPRESSOR) 25 MG tablet Take 0.5 tablets (12.5 mg total) by mouth 2 (two) times daily.      . Omega-3 Fatty Acids (FISH OIL) 1000 MG CAPS Take 2,000 mg by mouth 2 (two) times daily.      . ondansetron (ZOFRAN) 8 MG tablet Take 1 tablet (8 mg total) by mouth every 8 (eight) hours as needed for nausea.  20 tablet  0  . oxyCODONE (ROXICODONE) 5 MG/5ML solution Take 5 mLs (5  mg total) by mouth every 4 (four) hours as needed for pain. Take 5mL - 7.69mL every 4 hours as needed for pain. May instill in PEG tube.  500 mL  0  . ranitidine (ZANTAC) 300 MG capsule Take 300 mg by mouth at bedtime.      . traMADol (ULTRAM) 50 MG tablet Take 50 mg by mouth every 4 (four) hours as needed.       . venlafaxine (EFFEXOR) 37.5 MG tablet Take 37.5 mg by mouth daily.       . Vitamin D, Ergocalciferol, (DRISDOL) 50000 UNITS CAPS Take 50,000 Units by mouth every 14 (fourteen) days. Next dose is due august 30th       No current facility-administered medications for this encounter.    Physical Findings: The patient is in no acute distress. Patient is alert and oriented.  height is 5\' 10"  (1.778 m) and weight is 179 lb 12.8 oz (81.557 kg). His temperature is 98.2 F (36.8 C). His blood pressure is 157/69 and his pulse is 83. . Currently he has a dressing with steri-strips over his stoma,  erythema and dryness of skin on the anterior neck and bilateral SCV region. PEG site -  yellow thick discharge, no odor.   Lab Findings: Lab Results  Component Value Date   WBC 8.5 03/09/2013   HGB 7.7* 03/09/2013   HCT 23.5* 03/09/2013   MCV 90.2 03/09/2013   PLT 392 03/09/2013    Radiographic Findings: Dg Chest Port 1 View  02/27/2013   *RADIOLOGY REPORT*  Clinical Data: Replaced thoracostomy tube  PORTABLE CHEST - 1 VIEW  Comparison: Portable exam 0833 hours compared to 92,014  Findings: Tracheostomy tube projects over tracheal air column. Right jugular dual lumen central venous catheter stable with tip projecting over SVC. Upper-normal size of cardiac silhouette. Atherosclerotic calcification aorta. Pulmonary vascularity normal. Lungs clear. Bones unremarkable.  IMPRESSION: No acute abnormalities.   Original Report Authenticated By: Ulyses Southward, M.D.    Impression/Plan:    1) Head and Neck Cancer Status: healing from RT  2) Nutritional Status: - weight: relatively stable - PEG tube: he wants  this removed.  I asked them to call us once weight is stable and he is NOT using it at all for 2 wks. Then we can order removal.  Wound culture ordered to rule out infection at ostomy site today  3) Risk Factors: The patient has been educated about risk factors including alcohol and tobacco abuse; they understand that avoidance of alcohol and tobacco is important to prevent recurrences as well as other cancers. Hasn't smoked for about 2 decades.  4) Swallowing: functional, doing well  5) Energy: improving - will screen with TSH q 6-12 mo  7) Social: No active social issues to address at this time - dialysis going well per wife.  8) Other: disorientation at night - improving.  Wife will contintue to monitor.  Trach removed.  ENT followup with Dr Jearld Fenton in Dec. Continue tramadol prn pain. No longer needs narcotics.  9) Follow-up in Early Feb 2015 with me - with laryngoscope, but no imaging needed unless new symptoms warrant. This f/u will be immediately following my maternity leave. The patient was encouraged to call with any issues or questions before then. My partners can see him PRN while I am out.  I spent 25 minutes face to face with the patient and more than 50% of that time was spent in counseling and/or coordination of care. _____________________________________   Lonie Peak, MD

## 2013-03-28 NOTE — Progress Notes (Signed)
Mr. Trickey  Had his Janina Mayo removed today at 12:30pm.  Currently he has a dressing with steri-strips over his stoma.  He denies any pain or sore throat presently.  Eating soft foods and was able to eat a lamb chop this weekend.  He uses PEG tube intermittently   Note Redeness and dryness of skin on the anterior neck and bilateral [place] region.

## 2013-03-29 NOTE — Progress Notes (Signed)
Met with patient and his wife during scheduled f/u appt with Dr. Basilio Cairo.  Encouraged them to contact me as pt begins post-treatment phase of his care.  They indicated understanding and appreciation.  Young Berry, RN, BSN, [facility] Head & Neck Oncology Navigator [phone]

## 2013-03-29 NOTE — Telephone Encounter (Signed)
I left message on confidential voicemail of [facility] nurse advising that they need to contact PCP for further orders. Carron Curie, CMA

## 2013-03-30 ENCOUNTER — Encounter: Payer: Self-pay | Admitting: Vascular Surgery

## 2013-03-30 ENCOUNTER — Telehealth: Payer: Self-pay | Admitting: Emergency Medicine

## 2013-03-30 NOTE — Telephone Encounter (Signed)
RB, please advise I did not call the pt b/c they requesting not to be called

## 2013-03-31 ENCOUNTER — Encounter: Payer: Medicare Other | Admitting: Vascular Surgery

## 2013-03-31 ENCOUNTER — Ambulatory Visit (HOSPITAL_COMMUNITY)
Admission: RE | Admit: 2013-03-31 | Discharge: 2013-03-31 | Disposition: A | Discharge status: Home / Self Care | Payer: Medicare Other | Source: Ambulatory Visit | Attending: Vascular Surgery | Admitting: Vascular Surgery

## 2013-03-31 ENCOUNTER — Ambulatory Visit (INDEPENDENT_AMBULATORY_CARE_PROVIDER_SITE_OTHER): Payer: Self-pay | Admitting: Vascular Surgery

## 2013-03-31 ENCOUNTER — Telehealth: Payer: Self-pay | Admitting: *Deleted

## 2013-03-31 ENCOUNTER — Encounter: Payer: Self-pay | Admitting: Vascular Surgery

## 2013-03-31 ENCOUNTER — Ambulatory Visit: Payer: Self-pay | Admitting: Radiation Oncology

## 2013-03-31 ENCOUNTER — Telehealth: Payer: Self-pay | Admitting: Emergency Medicine

## 2013-03-31 VITALS — BP 142/61 | HR 75 | Temp 98.4°F | Wt 180.6 lb

## 2013-03-31 DIAGNOSIS — N186 End stage renal disease: Secondary | ICD-10-CM | POA: Insufficient documentation

## 2013-03-31 DIAGNOSIS — Z4931 Encounter for adequacy testing for hemodialysis: Secondary | ICD-10-CM

## 2013-03-31 DIAGNOSIS — Z930 Tracheostomy status: Secondary | ICD-10-CM

## 2013-03-31 NOTE — Progress Notes (Signed)
[facility] [place]  Postoperative Access Visit  History of Present Illness  Victor Lane is a 67 y.o. year old male who presents for postoperative follow-up for: L BC AVF (Date: 02/23/13).  The patient's wounds are healed.  The patient notes no steal symptoms.  The patient is able to complete their activities of daily living.  The patient's current symptoms are: none.  Pt is going to start PD once cath is placed.  For VQI Use Only  PRE-ADM LIVING: Home  AMB STATUS: Ambulatory  Physical Examination Filed Vitals:   03/31/13 0917  BP: 142/61  Pulse: 75  Temp: 98.4 F (36.9 C)    LUE: Incision is healed, skin feels warm, hand grip is 5/5, sensation in digits is intact, easily palpable thrill, bruit can be auscultated   Access duplex L arm (03/31/2013)  Diameter 6 mm except at anastomosis 1 mm  Depth < 6 mm throughout  Medical Decision Making  Victor Lane is a 67 y.o. year old male who presents s/p L BC AVF.  The patient's access is ready for use.  The patient's tunneled dialysis catheter can be removed after two successful cannulations and completed dialysis treatments.  The access duplex suggests anastomotic tapering, given the difficulty of this exam, I don't think I would make much of the actual measurement as the PSV is 461 c/s.  I would expect an even high PSV if the anastomosis was 1 mm.  If the flow rates are not adequate, I would consider revision of the anastomosis but otherwise I would not pursue such given his ultimate goal of PD.  Thank you for allowing [place] to participate in this patient's care.  Leonides Sake, MD [facility] [place] Office: [phone] Pager: [phone]  03/31/2013, 9:42 AM

## 2013-03-31 NOTE — Telephone Encounter (Signed)
Spoke with patient's wife to let her know that wound culture from PEG was unremarkable and that pt does not need antibiotics.  She stated that the site "looks better since we saw you".  Young Berry, RN, BSN, [facility] Head & Neck Oncology Navigator [phone]

## 2013-03-31 NOTE — Telephone Encounter (Signed)
lmomtcb for Victor Lane

## 2013-03-31 NOTE — Telephone Encounter (Signed)
LMTCB for the pt 

## 2013-03-31 NOTE — Telephone Encounter (Signed)
I haven't seen him in office, I don't know any details of whether he is decanulated, is doing well / poorly, etc. An OV would be helpful here, but I at least need to know what has happened since his hospitalization. ,

## 2013-03-31 NOTE — Telephone Encounter (Signed)
Spoke with Darl Pikes She states that the pt's trach was removed, and they need order to have the supplies p/u  Order sent and nothing further needed

## 2013-04-03 NOTE — Telephone Encounter (Signed)
Spoke with patient- he states that all trach equipment has been picked up over the weekend-was never used . Pt states he feels he is doing well overall; sees plenty of other doctors at this time and does not want an appointment with RB. Will forward to RB as FYI.

## 2013-04-03 NOTE — Telephone Encounter (Signed)
Thanks

## 2013-04-04 ENCOUNTER — Other Ambulatory Visit (HOSPITAL_COMMUNITY): Payer: Self-pay | Admitting: Cardiology

## 2013-04-04 ENCOUNTER — Ambulatory Visit (HOSPITAL_COMMUNITY): Payer: Medicare Other | Attending: Cardiovascular Disease | Admitting: Radiology

## 2013-04-04 DIAGNOSIS — E119 Type 2 diabetes mellitus without complications: Secondary | ICD-10-CM | POA: Insufficient documentation

## 2013-04-04 DIAGNOSIS — R079 Chest pain, unspecified: Secondary | ICD-10-CM

## 2013-04-04 DIAGNOSIS — I080 Rheumatic disorders of both mitral and aortic valves: Secondary | ICD-10-CM | POA: Insufficient documentation

## 2013-04-04 DIAGNOSIS — I120 Hypertensive chronic kidney disease with stage 5 chronic kidney disease or end stage renal disease: Secondary | ICD-10-CM | POA: Insufficient documentation

## 2013-04-04 DIAGNOSIS — N186 End stage renal disease: Secondary | ICD-10-CM | POA: Insufficient documentation

## 2013-04-04 DIAGNOSIS — I10 Essential (primary) hypertension: Secondary | ICD-10-CM | POA: Insufficient documentation

## 2013-04-04 DIAGNOSIS — R072 Precordial pain: Secondary | ICD-10-CM | POA: Insufficient documentation

## 2013-04-04 NOTE — Progress Notes (Signed)
Echocardiogram performed.  

## 2013-04-06 ENCOUNTER — Telehealth: Payer: Self-pay | Admitting: Emergency Medicine

## 2013-04-06 NOTE — Telephone Encounter (Signed)
ATC susan and phone d/c after ringing several times and no response. Called back and the same thing happened. WCB

## 2013-04-07 NOTE — Telephone Encounter (Signed)
LMTCBx1. This pt has not been seen in the office and according to phone note we have attempted to get him in for OV but pt refuses. This order will need to go through PCP or pt needs to set an appt. Carron Curie, CMA

## 2013-04-10 ENCOUNTER — Telehealth: Payer: Self-pay | Admitting: General Surgery

## 2013-04-10 ENCOUNTER — Telehealth: Payer: Self-pay | Admitting: Interventional Cardiology

## 2013-04-10 ENCOUNTER — Telehealth: Payer: Self-pay | Admitting: *Deleted

## 2013-04-10 DIAGNOSIS — I359 Nonrheumatic aortic valve disorder, unspecified: Secondary | ICD-10-CM

## 2013-04-10 NOTE — Telephone Encounter (Signed)
Pt is aware and order in EPIC

## 2013-04-10 NOTE — Telephone Encounter (Signed)
lmomtcb x 2 for susan.   

## 2013-04-10 NOTE — Telephone Encounter (Signed)
New Problem:  Pt states he is calling to obtain recent test results. Pt states it is ok to leave a voice mail if he does not answer.

## 2013-04-10 NOTE — Telephone Encounter (Signed)
Message copied by Nita Sells on Mon Apr 10, 2013  9:42 AM ------      Message from: Quintella Reichert      Created: Tue Apr 04, 2013  3:38 PM       Please let patient know that echo showed normal LVF with mildly thickened and stiff heart muscle, moderate thickening of the AV and moderate leakiness of the AV and mildly to moderately leakiness of the MV - please repeat echo in 1 year to followup on AI ------

## 2013-04-10 NOTE — Telephone Encounter (Signed)
Victor Lane, pt is returning your call.

## 2013-04-10 NOTE — Telephone Encounter (Signed)
Pt is aware of the results

## 2013-04-11 ENCOUNTER — Telehealth: Payer: Self-pay | Admitting: *Deleted

## 2013-04-11 ENCOUNTER — Other Ambulatory Visit: Payer: Self-pay | Admitting: Radiation Oncology

## 2013-04-11 DIAGNOSIS — C320 Malignant neoplasm of glottis: Secondary | ICD-10-CM

## 2013-04-11 NOTE — Telephone Encounter (Signed)
Called patient to inform of peg tube removal for 04-13-13 at 3:00 pm at [facility] Rad[facility], spoke with patient and he is aware of this procedure.

## 2013-04-11 NOTE — Telephone Encounter (Signed)
Rec'd VM from patient's wife with indication that Keylen has been maintaining his weight at 179/180# over the past 2 weeks.  This meets the criteria for PEG removal per their conversation with Dr. Basilio Cairo and they are requesting an appt for this procedure.  Relayed information to Dr. Basilio Cairo via [place].  Young Berry, RN, BSN, [facility] Head & Neck Oncology Navigator [phone]

## 2013-04-11 NOTE — Telephone Encounter (Signed)
Darl Pikes returned call.  Spoke with Darl Pikes and informed her of the below.  Darl Pikes stated that while pt was discharged from the hospital with the trach, it has since been removed x 1-2 weeks ago.  Upon inspection of pt's chart, it appears that Radiation Oncology removed this on 10.7.14.  Per Darl Pikes, when the trach was removed thin steri-strips were placed over the stoma and it has yet to close.  Darl Pikes did place some thicker steri-strips over this today.  Darl Pikes asks if RB will approve order to continue nurse visits "this time" and for future orders she will contact another physician.  Per Darl Pikes, pt is doing well and does not have any issues at this time.  Dr B. please advise, thank you.

## 2013-04-11 NOTE — Telephone Encounter (Signed)
Spoke with pt to inform him that Dr. Basilio Cairo has placed an order for his PEG removal and that he should be hearing from a scheduler re: an appointment.  Pt expressed appreciation for the call.  Will continue to navigate as L3 (treatments completed) patient.  Young Berry, RN, BSN, [facility] Head & Neck Oncology Navigator [phone]

## 2013-04-11 NOTE — Telephone Encounter (Signed)
LMOM TCB x3 for Victor Lane w/ [facility].

## 2013-04-12 NOTE — Telephone Encounter (Signed)
Yes you can order 3 weeks in my name

## 2013-04-12 NOTE — Telephone Encounter (Signed)
Spoke with Darl Pikes at [facility] and given order to continue nursing visits for 3 more weeks.

## 2013-04-13 ENCOUNTER — Other Ambulatory Visit: Payer: Self-pay | Admitting: Radiation Oncology

## 2013-04-13 ENCOUNTER — Ambulatory Visit (HOSPITAL_COMMUNITY)
Admission: RE | Admit: 2013-04-13 | Discharge: 2013-04-13 | Disposition: A | Discharge status: Home / Self Care | Payer: Medicare Other | Source: Ambulatory Visit | Attending: Radiation Oncology | Admitting: Radiation Oncology

## 2013-04-13 DIAGNOSIS — Z431 Encounter for attention to gastrostomy: Secondary | ICD-10-CM | POA: Insufficient documentation

## 2013-04-13 DIAGNOSIS — Z8521 Personal history of malignant neoplasm of larynx: Secondary | ICD-10-CM | POA: Insufficient documentation

## 2013-04-13 DIAGNOSIS — C320 Malignant neoplasm of glottis: Secondary | ICD-10-CM

## 2013-04-13 MED ORDER — LIDOCAINE VISCOUS 2 % MT SOLN
OROMUCOSAL | Status: AC
  Filled 2013-04-13: qty 15

## 2013-04-13 MED ORDER — LIDOCAINE VISCOUS 2 % MT SOLN
15.0000 mL | Freq: Once | OROMUCOSAL | Status: AC
  Administered 2013-04-13: 15 mL via OROMUCOSAL

## 2013-04-13 NOTE — Procedures (Signed)
Successful removal of gastrostomy tube. No complications  Brayton El PA-C Interventional Radiology 04/13/2013 3:29 PM

## 2013-04-19 ENCOUNTER — Ambulatory Visit (INDEPENDENT_AMBULATORY_CARE_PROVIDER_SITE_OTHER): Payer: Medicare Other | Admitting: Diagnostic Neuroimaging

## 2013-04-19 ENCOUNTER — Encounter: Payer: Self-pay | Admitting: Diagnostic Neuroimaging

## 2013-04-19 VITALS — BP 170/78 | HR 94 | Temp 97.6°F | Ht 70.5 in | Wt 190.0 lb

## 2013-04-19 DIAGNOSIS — G589 Mononeuropathy, unspecified: Secondary | ICD-10-CM

## 2013-04-19 DIAGNOSIS — M545 Low back pain: Secondary | ICD-10-CM | POA: Insufficient documentation

## 2013-04-19 DIAGNOSIS — R29898 Other symptoms and signs involving the musculoskeletal system: Secondary | ICD-10-CM

## 2013-04-19 DIAGNOSIS — R5381 Other malaise: Secondary | ICD-10-CM | POA: Insufficient documentation

## 2013-04-19 DIAGNOSIS — G629 Polyneuropathy, unspecified: Secondary | ICD-10-CM | POA: Insufficient documentation

## 2013-04-19 DIAGNOSIS — M79609 Pain in unspecified limb: Secondary | ICD-10-CM | POA: Insufficient documentation

## 2013-04-19 MED ORDER — GABAPENTIN 100 MG PO CAPS: 100.0000 mg | ORAL_CAPSULE | Freq: Every day | ORAL | Status: DC

## 2013-04-19 NOTE — Patient Instructions (Signed)
I will check additional testing.  Try gabapentin 100mg  daily for leg pain and neuropathy pain.

## 2013-04-19 NOTE — Progress Notes (Signed)
[facility]  PATIENT: Victor Lane DOB: 04-30-1946  REFERRING CLINICIAN: Foy Guadalajara HISTORY FROM: patient and wife REASON FOR VISIT: new consult   HISTORICAL  CHIEF COMPLAINT:  Chief Complaint  Patient presents with  . Extremity Weakness  . Pain    leg    HISTORY OF PRESENT ILLNESS:   67 year old male with history of [id number] Glottic Squamous Cell Carcinoma, hypertension, diabetes, end-stage renal disease on hemodialysis, fibromyalgia, here for evaluation of bilateral lower extremity pain and weakness.  Patient has long history of chronic pain in his joints from the 1990s, previously saw a rheumatologist and diagnosed with fibromyalgia. He was treated with Vioxx for several months which improved his symptoms but then was taken off the market. Patient's had some anxiety symptoms at that time. Over the years she continued to struggle with diffuse joint pain.  April 2014 patient developed increasing cramps and leg pain especially with exertion. Symptoms relieved by standing and resting. Symptoms started in his calves, progressed to his thighs and hips. He had ultrasound testing of his legs including ABI which apparently were unremarkable. He had no color change in his feet. He had no cool sensation in his feet and legs.  July 2014 patient developed difficulty speaking. He went to the ear nose and throat, diagnosed with vocal cord polyps. These were biopsied and found to be squamous cell carcinoma. Patient underwent surgical resection and radiation therapy.  02/10/2013 patient became unresponsive at home. He was taken to the hospital emergency room and diagnosed with renal failure. He was admitted to the hospital for 2 weeks, treated for bilateral pneumonia, acute renal failure and started on hemodialysis in the hospital. Patient was comatose for several days and when he came out of the coma he noticed some numbness in his left hand digits 4 and 5. He continues to have  some pain and numbness in his left hand fourth and fifth digits.  After discharge from hospital patient was still able to ambulate. He then resumed radiation therapy and hemodialysis on a very busy schedule. Patient required G-tube placement and nutrition due to inability to swallow and throat pain. Over last one to 2 months patient has had increasing fatigue, generalized weakness, legs worse than arms, as well as ongoing numbness and tingling in the toes and feet. Patient's weakness is out of proportion to mild pain. He was using his arms to help push him up but now his arms feel weak as well. He is a difficult time using a walker now and feels unsteady.  Patient is taking tramadol for pain control and Effexor for mood and pain control.   REVIEW OF SYSTEMS: Full 14 system review of systems performed and notable only for fatigue ringing in ears diarrhea constipation confusion numbness weakness passing out tremor insomnia sleepiness snoring restless legs not in a sleep decreased energy.  ALLERGIES: Allergies  Allergen Reactions  . Amoxicillin     Causes upset stomach  . Fentanyl     Confusion when patch applied    HOME MEDICATIONS: Outpatient Prescriptions Prior to Visit  Medication Sig Dispense Refill  . aspirin 81 MG tablet Take 81 mg by mouth daily.       . calcium acetate (PHOSLO) 667 MG capsule Take 1 capsule (667 mg total) by mouth 3 (three) times daily with meals.  90 capsule  0  . esomeprazole (NEXIUM) 40 MG capsule Take 40 mg by mouth daily before breakfast.      . metoprolol tartrate (LOPRESSOR) 25  MG tablet Take 0.5 tablets (12.5 mg total) by mouth 2 (two) times daily.      . ranitidine (ZANTAC) 300 MG capsule Take 300 mg by mouth at bedtime.      . traMADol (ULTRAM) 50 MG tablet Take 50 mg by mouth every 4 (four) hours as needed.       . Venlafaxine HCl 37.5 MG TB24 Take 1 tablet by mouth every morning.       . Vitamin D, Ergocalciferol, (DRISDOL) 50000 UNITS CAPS Take 50,000  Units by mouth every 14 (fourteen) days. Next dose is due august 30th      . amLODipine (NORVASC) 10 MG tablet       . b complex vitamins capsule Take 1 capsule by mouth daily.      . clindamycin (CLEOCIN) 300 MG capsule       . doxazosin (CARDURA) 2 MG tablet       . fentaNYL (DURAGESIC - DOSED MCG/HR) 25 MCG/HR patch       . glipiZIDE (GLUCOTROL XL) 2.5 MG 24 hr tablet       . HYDROcodone-acetaminophen (HYCET) 7.5-325 mg/15 ml solution       . lidocaine (XYLOCAINE) 2 % solution       . morphine (ROXANOL) 20 MG/ML concentrated solution       . Omega-3 Fatty Acids (FISH OIL) 1000 MG CAPS Take 2,000 mg by mouth 2 (two) times daily.      . ondansetron (ZOFRAN) 8 MG tablet Take 1 tablet (8 mg total) by mouth every 8 (eight) hours as needed for nausea.  20 tablet  0  . oxyCODONE (ROXICODONE) 5 MG/5ML solution Take 5 mLs (5 mg total) by mouth every 4 (four) hours as needed for pain. Take 5mL - 7.65mL every 4 hours as needed for pain. May instill in PEG tube.  500 mL  0  . ranitidine (ZANTAC) 300 MG tablet Take 300 mg by mouth at bedtime.       . sucralfate (CARAFATE) 1 G tablet       . venlafaxine (EFFEXOR) 37.5 MG tablet Take 37.5 mg by mouth daily.        No facility-administered medications prior to visit.    PAST MEDICAL HISTORY: Past Medical History  Diagnosis Date  . Diabetes mellitus   . Hypertension   . GERD (gastroesophageal reflux disease)   . Fibromyalgia   . Chest pain   . Carotid artery occlusion   . Chronic kidney disease     - Stage 5- follwed by Dr Hyman Hopes  . Complication of anesthesia     agiation when he awaken  . Neuropathy   . Arthritis   . Sleep apnea   . H/O vitamin D deficiency   . Hearing loss   . Hypercholesteremia   . Myalgia and myositis   . 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 S/P radiation therapy 01/24/2013-03/14/2013    Larynx/glottis / 63 Gy in 28 fractions    PAST SURGICAL HISTORY: Past  Surgical History  Procedure Laterality Date  . Cholecystectomy  1993  . Rotator cuff repair  07/06/2011    right  . Carotid endarterectomy Left   . Coolonoscopy    . Colonoscopy w/ biopsies and polypectomy      Hx: of  . Microlaryngoscopy with laser N/A 01/04/2013    Procedure: MICROLARYNGOSCOPY WITH BIOPSY/LASER;  Surgeon: Suzanna Obey, MD;  Location: [facility] OR;  Service: ENT;  Laterality: N/A;  . Av fistula placement Left 02/23/2013    Procedure: ARTERIOVENOUS (AV) FISTULA CREATION;  Surgeon: Fransisco Hertz, MD;  Location: [facility] OR;  Service: Vascular;  Laterality: Left;    FAMILY HISTORY: Family History  Problem Relation Age of Onset  . Adopted: Yes    SOCIAL HISTORY:  History   Social History  . Marital Status: Married    Spouse Name: Dennie Bible    Number of Children: 1  . Years of Education: HS   Occupational History  .      Self-Employed   Social History Main Topics  . Smoking status: Former Smoker -- 2.00 packs/day for 31 years    Types: Cigarettes    Quit date: 06/23/1995  . Smokeless tobacco: Never Used  . Alcohol Use: No  . Drug Use: No  . Sexual Activity: Not on file   Other Topics Concern  . Not on file   Social History Narrative   Patient lives at home with spouse.   Caffiene Use: 1 cup daily     PHYSICAL EXAM  Filed Vitals:   04/19/13 0947  BP: 170/78  Pulse: 94  Temp: 97.6 F (36.4 C)  TempSrc: Oral  Height: 5' 10.5" (1.791 m)  Weight: 190 lb (86.183 kg)    Not recorded    Body mass index is 26.87 kg/(m^2).  GENERAL EXAM: Patient is in no distress; FLAT AFFECT.   CARDIOVASCULAR: Regular rate and rhythm, no murmurs, no carotid bruits  NEUROLOGIC: MENTAL STATUS: awake, alert, language fluent, comprehension intact, naming intact CRANIAL NERVE: no papilledema on fundoscopic exam, pupils equal and reactive to light, visual fields full to confrontation, extraocular muscles intact, no nystagmus, facial sensation and strength symmetric, uvula midline,  shoulder shrug symmetric, tongue midline. NO FASCICULATIONS. MOTOR: normal bulk and tone, BUE 4+, BLE 4 PROX, 5 DISTAL. NO FASCICULATIONS IN ARMS OR LEGS. SENSORY: DECR PP IN TOES/FEET IN GRADIENT UP TO SHINS. DECR PP IN LEFT HAND (DIGITS 4, 5).  COORDINATION: finger-nose-finger, fine finger movements normal REFLEXES: deep tendon reflexes BUE 2, KNEES 2, ANKLES 1. DOWN GOING TOES.  GAIT/STATION: NEEDS 2 PERSON ASSIST TO STAND. UNSTEADY WITH STANDING UNASSISTED. ABLE TO TAKE FEW STEPS WITH 2 PERSON ASSIST.    DIAGNOSTIC DATA (LABS, IMAGING, TESTING) - I reviewed patient records, labs, notes, testing and imaging myself where available.  Lab Results  Component Value Date   WBC 8.5 03/09/2013   HGB 7.7* 03/09/2013   HCT 23.5* 03/09/2013   MCV 90.2 03/09/2013   PLT 392 03/09/2013      Component Value Date/Time   NA 141 03/09/2013 1520   NA 134* 02/25/2013 0905   K 4.8 03/09/2013 1520   K 3.9 02/25/2013 0905   CL 93* 02/25/2013 0905   CO2 30* 03/09/2013 1520   CO2 26 02/25/2013 0905   GLUCOSE 110 03/09/2013 1520   GLUCOSE 127* 02/25/2013 0905   BUN 26.9* 03/09/2013 1520   BUN 33* 02/25/2013 0905   CREATININE 5.8 Repeated and Verified* 03/09/2013 1520   CREATININE 5.94* 02/25/2013 0905   CALCIUM 8.8 03/09/2013 1520   CALCIUM 7.6* 02/25/2013 0905   PROT 5.0* 02/13/2013 0420   ALBUMIN 2.2* 02/24/2013 0639   AST 58* 02/13/2013 0420   ALT 10 02/13/2013 0420   ALKPHOS 60 02/13/2013 0420   BILITOT 0.2* 02/13/2013 0420   GFRNONAA 9* 02/25/2013 0905   GFRAA 10* 02/25/2013 0905   Lab Results  Component Value Date   TRIG 434* 02/15/2013  No results found for this basename: HGBA1C   Lab Results  Component Value Date   VITAMINB12 570 02/10/2013   No results found for this basename: TSH      ASSESSMENT AND PLAN  67 y.o. year old male here with long-standing chronic pain, now with new onset end-stage renal disease and glottic squamous cell carcinoma diagnosis in last 3 months, with progressive muscle weakness in  the lower extremities. Also with diagnosis of diabetes, controlled with diet.  Exam is notable for signs of peripheral neuropathy lower extremities, significant weakness in the proximal lower extremities, as well as left ulnar neuropathy at the wrist.   Ddx: diabetic neuropathy, ESRD neuropathy, myopathy, myasthenia gravis, lumbar spinal stenosis / radiculopathy, metabolic, paraneoplastic.  Will pursue further work up and try low dose gabapentin for pain control.  PLAN: Orders Placed This Encounter  Procedures  . MR Lumbar Spine Wo Contrast  . CK  . Vitamin B12  . Aldolase  . Acetylcholine Receptor, Binding  . NCV with EMG(electromyography)    Meds ordered this encounter  Medications  . multivitamin (RENA-VIT) TABS tablet    Sig: Take 1 tablet by mouth at bedtime.  . gabapentin (NEURONTIN) 100 MG capsule    Sig: Take 1 capsule (100 mg total) by mouth daily.    Dispense:  30 capsule    Refill:  6    Return for EMG/NCS.    Suanne Marker, MD 04/19/2013, 11:15 AM Certified in Neurology, Neurophysiology and Neuroimaging  [facility] [address] [place], [place] [address] [phone]

## 2013-04-20 ENCOUNTER — Other Ambulatory Visit (INDEPENDENT_AMBULATORY_CARE_PROVIDER_SITE_OTHER): Payer: Self-pay

## 2013-04-20 DIAGNOSIS — Z0289 Encounter for other administrative examinations: Secondary | ICD-10-CM

## 2013-04-25 ENCOUNTER — Ambulatory Visit (INDEPENDENT_AMBULATORY_CARE_PROVIDER_SITE_OTHER)
Admission: RE | Admit: 2013-04-25 | Discharge: 2013-04-25 | Disposition: A | Discharge status: Home / Self Care | Payer: Medicare Other | Source: Ambulatory Visit | Attending: Internal Medicine | Admitting: Internal Medicine

## 2013-04-25 ENCOUNTER — Encounter: Payer: Self-pay | Admitting: Internal Medicine

## 2013-04-25 ENCOUNTER — Telehealth: Payer: Self-pay | Admitting: Internal Medicine

## 2013-04-25 ENCOUNTER — Ambulatory Visit (INDEPENDENT_AMBULATORY_CARE_PROVIDER_SITE_OTHER): Payer: Medicare Other | Admitting: Internal Medicine

## 2013-04-25 VITALS — BP 142/60 | HR 89 | Temp 97.7°F | Ht 70.0 in | Wt 178.0 lb

## 2013-04-25 DIAGNOSIS — J9610 Chronic respiratory failure, unspecified whether with hypoxia or hypercapnia: Secondary | ICD-10-CM

## 2013-04-25 NOTE — Progress Notes (Signed)
Subjective:    Patient ID: Victor Lane, male    DOB: Oct 21, 1945   MRN: [id number]  HPI  2 yowm quit smoking 1997 with no sequelae and good activity tolerance by no aerobics until Summer 2014 when downhill with dx of esrf/ laryngeal ca on RT:  Admit date: 02/10/2013  Discharge date: 02/25/2013  Discharge Diagnoses:  Acute respiratory failure due to aspiration pneumonia and pulmonary edema  Laryngeal cancer  Trach status  PAF with RVR >> NSR  Hx of HTN.  ESRD on HD.  Hyperkalemia.  Dysphagia.  Hx of GERD.  Diarrhea >> improving.  S/P G tube 8/29.  Anemia of critical illness and chronic disease.  Hx of laryngeal cancer s/p XRT.  HCAP, aspiration  DM-2  Encephalopathy d/t uremia and hypoxemia, resolved.  Pain  Deconditioning.  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 Jearld Fenton, final pathology report 01/04/2013) currently undergoing radiation therapy under Dr. Basilio Cairo care, presented to [facility] long emergency department 8/22 after found to be more lethargic at home by his wife. In ER pt explained he had felt progressively weaker over the past several days and unable to ambulate, went to bed the night before and wife was unable to wake him up that morning. Wife also explained patient has been more confused over the past several days. Patient also reported progressively worsening shortness of breath and feeling that he cannot take full deep breath, worse with exertion and somewhat improved with rest, 2-3 pillow orthopnea.He was first diagnosed with kidney disease about 7 years ago but has never had any workup done. In the emergency department, patient found to be  hypoxic with oxygen saturation in the 80s, improved with 4 L of nasal cannula. Electrolyte panel significant for hyperkalemia K = 7.0, creatinine equal 11.22. TRH asked to admit to [facility] hospital for further evaluation and management and due to hypoxia, SDU bed requested.  He was transferred to [facility] on 8/22 for HD and Permacath placement and emergent HD. PCCM was asked to see for progressive respiratory failure which required NIPPV. He received Emergent HD, also was placed on cycling NIPPV and oxygen, as well as empiric antibiotics to cover for possible aspiration pneumonia. On 8/24 he had acute worsening agitation and hypoxia. This did not improve with NIPPV and he was therefore intubated. He was noted to be difficult intubation due to his active anatomical changes from radiation and his cancer. He was supported on full vent support using low tidal volume ventilation. ENT was consulted and recommended tracheostomy. A size 8 cuffed shiley was placed on 8/26. On 8/26 we were able to start weaning efforts. We were able to d/c vent from room by 8/27 and continue ATC only. FEES study was performed on 8/27 and showed: Severe pharyngeal dysphagia w/ noted aspiration even with honey thick consistency liquids.  On 8/28 he had been moved to the SDU setting. His encephalopathy had resolved, he was off the vent 24/7. Afib w/ RVR which had occurred while acutely ill had resolved and he was in NSR, and he had completed his full course to therapy of antibiotics for aspiration PNA. Rehab services including PT/OT and SLp were following. He continued to fail dysphagia screening  and PEG was recommended. PEG tube was placed on 8/29. Later that day on 8/29 he was seen by vascular surgery for permament HD access/ fistula. Vein mapping was completed on 8/31. On 9/2 Nephrology raised concern about trach status and eligibility to go to dialysis center. Said he would have to be able to have trach capped during HD treatments in order to be  eligible (due to infection control policy). Because of this we decreased his trach from a 8 cuffless to 6 cuffless shiley and started red-cap trials. He had repeat SLP eval and MBS completed on 9/2 which showed marked improvement in swallowing function and a dysphagia 3/ mechanically soft diet was started w/ thin liquids as a trial. He had AVF scheduled for 9/3 and this was completed. Post-op had almost 1 gm hgb drift. Vascular followed for small hematoma. He did receive 2 units of blood on 9/5. He continued to improve over the following days and was deemed stable for discharge.  At time of discharge he has his trach capped during the day, all day, and he is tolerating room air 24 hours a day. He will go home with and continuing PT/OT. He is getting nutrition orally and is able to supplement via PEG if needed. He is undergoing scheduled HD treatments. He will be discharged to home with the following plan of care outlined below.    04/25/2013  Post hosp ov/Victor Lane re: weakness  Chief Complaint  Patient presents with  . Pulmonary Consult    Referred per Dr. Foy Guadalajara for eval of hypoxia. Pt's spouse reports he is restless at night and sats drop into the 70's. Pt reports his breathing usually only bothers him at night, but today he is having minimal DOE. Sats when he arrived 87%ra--increased to 92%ra with pursed lip breathing after appro 20 seconds.   at discharge was not on 02   Breathing worse p HD when feels more tired and wife says this correlates with desat  Worse gradually since about a week after discharge when RT for throat ca done  Not coughing much at all   No obvious other patterns in  day to day or daytime variabilty or cp or chest tightness, subjective wheeze overt sinus or hb symptoms. No unusual exp hx or h/o childhood pna/ asthma or knowledge of premature birth.  Sleeping ok without nocturnal  or early am exacerbation  of respiratory  c/o's or need for noct saba. Also denies any obvious  fluctuation of symptoms with weather or environmental changes or other aggravating or alleviating factors except as outlined above   Current Medications, Allergies, Complete Past Medical History, Past Surgical History, Family History, and Social History were reviewed in [employer] record.     Review of Systems  Constitutional: Positive for unexpected weight change. Negative for fever, chills, activity change and appetite change.  HENT: Positive for trouble swallowing. Negative for congestion, dental problem, postnasal drip, rhinorrhea, sneezing, sore throat and voice change.   Eyes: Negative for visual disturbance.  Respiratory: Positive for cough and shortness of breath. Negative for choking.   Cardiovascular: Positive for leg swelling. Negative for chest pain.  Gastrointestinal: Negative for nausea, vomiting and abdominal pain.  Genitourinary: Negative for difficulty urinating.  Musculoskeletal: Negative for arthralgias.  Skin: Negative for rash.  Psychiatric/Behavioral: Negative for behavioral problems and confusion.       Objective:   Physical Exam   W/c bound wm nad with sats 87% on arrival  Wt Readings from Last  3 Encounters:  04/25/13 178 lb (80.74 kg)  04/19/13 190 lb (86.183 kg)  03/31/13 180 lb 9.6 oz (81.92 kg)     HEENT: nl dentition, turbinates, and orophanx. Nl external ear canals without cough reflex   NECK :  without JVD/Nodes/TM/ nl carotid upstrokes bilaterally/ trach site healing nicely   LUNGS: no acc muscle use, clear to A and P bilaterally without cough on insp or exp maneuvers   CV:  RRR  no s3 or murmur or increase in P2, trace bilateral  edema   ABD:  soft and nontender with nl excursion in the supine position. No bruits or organomegaly, bowel sounds nl  MS:  warm without deformities, calf tenderness, cyanosis or clubbing  SKIN: warm and dry without lesions    NEURO:  alert, approp, no deficits   Echo 04/04/13 Left  ventricle: The cavity size was normal. There was moderate focal basal and mild concentric hypertrophy. Systolic function was normal. Wall motion was normal; there were no regional wall motion abnormalities. Features are consistent with a pseudonormal left ventricular filling pattern, with concomitant abnormal relaxation and increased filling pressure (grade 2 diastolic dysfunction). - Aortic valve: Moderate thickening and calcification, consistent with sclerosis. Moderate regurgitation. - Mitral valve: Mild to moderate regurgitation. - Left atrium: The atrium was mildly dilated.  CXR  04/25/2013 :  Mild CHF with small pleural effusions.      Assessment & Plan:

## 2013-04-25 NOTE — Assessment & Plan Note (Signed)
Most likely he has mild chronic lung dz from remote smoking and has not completely recovered from ALI/ aspiration injury with onging vol overload related to esrf in setting of diast chf/ valvular ht dz  No quick fix for these problems so rec   1) 02 2lpm 24/7  2) keep as dry as tolerates and bp tight control

## 2013-04-25 NOTE — Telephone Encounter (Signed)
Per Victor Lane pt was too weak to walk. He almost fell trying to weigh. I spoke with [name]. I added the information in the snap shot. Nothing further needed

## 2013-04-25 NOTE — Patient Instructions (Addendum)
Zantac 300 mg at bedtime  GERD (REFLUX)  is an extremely common cause of respiratory symptoms, many times with no significant heartburn at all.    It can be treated with medication, but also with lifestyle changes including avoidance of late meals, excessive alcohol, smoking cessation, and avoid fatty foods, chocolate, peppermint, colas, red wine, and acidic juices such as orange juice.  NO MINT OR MENTHOL PRODUCTS SO NO COUGH DROPS  USE SUGARLESS CANDY INSTEAD (jolley ranchers or Stover's)  NO OIL BASED VITAMINS - use powdered substitutes.   Please see patient coordinator before you leave today  to schedule 24 h 02 at 2lpm   Please remember to go to the   x-ray department downstairs for your tests - we will call you with the results when they are available.

## 2013-04-26 LAB — CK: Total CK: 89 U/L (ref 24–204)

## 2013-04-26 LAB — ACETYLCHOLINE RECEPTOR, BINDING: AChR Binding Ab, Serum: <0.03 nmol/L (ref 0.00–0.24)

## 2013-04-27 ENCOUNTER — Ambulatory Visit
Admission: RE | Admit: 2013-04-27 | Discharge: 2013-04-27 | Disposition: A | Discharge status: Home / Self Care | Payer: Medicare Other | Source: Ambulatory Visit | Attending: Diagnostic Neuroimaging | Admitting: Diagnostic Neuroimaging

## 2013-04-27 ENCOUNTER — Telehealth: Payer: Self-pay | Admitting: Internal Medicine

## 2013-04-27 DIAGNOSIS — M79609 Pain in unspecified limb: Secondary | ICD-10-CM

## 2013-04-27 DIAGNOSIS — R29898 Other symptoms and signs involving the musculoskeletal system: Secondary | ICD-10-CM

## 2013-04-27 DIAGNOSIS — R5381 Other malaise: Secondary | ICD-10-CM

## 2013-04-27 DIAGNOSIS — M545 Low back pain: Secondary | ICD-10-CM

## 2013-04-27 DIAGNOSIS — G629 Polyneuropathy, unspecified: Secondary | ICD-10-CM

## 2013-04-27 NOTE — Telephone Encounter (Signed)
Nothing further needed.  Pt received old message from yesterday in regards to pts cxr results.  Pt already made aware.

## 2013-04-28 ENCOUNTER — Telehealth: Payer: Self-pay | Admitting: Internal Medicine

## 2013-04-28 DIAGNOSIS — J9610 Chronic respiratory failure, unspecified whether with hypoxia or hypercapnia: Secondary | ICD-10-CM

## 2013-04-28 NOTE — Telephone Encounter (Signed)
[id number] to discuss with pt spouse before orders are sent. Carron Curie, CMA

## 2013-04-28 NOTE — Telephone Encounter (Signed)
Fine with me

## 2013-04-28 NOTE — Telephone Encounter (Signed)
[email or web address].Gustave Lindeman, CMA  

## 2013-04-28 NOTE — Telephone Encounter (Signed)
[name] returning call can be reached at [phone].Raylene Everts

## 2013-04-28 NOTE — Telephone Encounter (Signed)
I spoke with the pt spouse and she states and [facility] nurse came out and tested the pt oxygen at night and that it was between 80-85%. Pt is currently wearing 2 liters of oxygen at bed and spouse wants to know can this be increased to 3 liters? She is also asking for a humidifier?   I called [facility] and they do not have any record of any testing but they stated that the pt is going to need an ONO for insurance to pay for bedtime oxygen.  Please advise if ok to send order for humidifier, ok to increase to 3 liters, and if ok to order ONO? Carron Curie, CMA

## 2013-05-01 ENCOUNTER — Emergency Department (HOSPITAL_COMMUNITY)
Admission: EM | Admit: 2013-05-01 | Discharge: 2013-05-01 | Discharge status: Against Medical Advice | Payer: Medicare Other | Attending: Emergency Medicine | Admitting: Emergency Medicine

## 2013-05-01 ENCOUNTER — Encounter (HOSPITAL_COMMUNITY): Payer: Self-pay | Admitting: Emergency Medicine

## 2013-05-01 DIAGNOSIS — E7800 Pure hypercholesterolemia, unspecified: Secondary | ICD-10-CM | POA: Insufficient documentation

## 2013-05-01 DIAGNOSIS — G589 Mononeuropathy, unspecified: Secondary | ICD-10-CM | POA: Insufficient documentation

## 2013-05-01 DIAGNOSIS — R296 Repeated falls: Secondary | ICD-10-CM | POA: Insufficient documentation

## 2013-05-01 DIAGNOSIS — Z7982 Long term (current) use of aspirin: Secondary | ICD-10-CM | POA: Insufficient documentation

## 2013-05-01 DIAGNOSIS — M546 Pain in thoracic spine: Secondary | ICD-10-CM | POA: Insufficient documentation

## 2013-05-01 DIAGNOSIS — M6281 Muscle weakness (generalized): Secondary | ICD-10-CM | POA: Insufficient documentation

## 2013-05-01 DIAGNOSIS — Y9229 Other specified public building as the place of occurrence of the external cause: Secondary | ICD-10-CM | POA: Insufficient documentation

## 2013-05-01 DIAGNOSIS — Z992 Dependence on renal dialysis: Secondary | ICD-10-CM | POA: Insufficient documentation

## 2013-05-01 DIAGNOSIS — E119 Type 2 diabetes mellitus without complications: Secondary | ICD-10-CM | POA: Insufficient documentation

## 2013-05-01 DIAGNOSIS — I6529 Occlusion and stenosis of unspecified carotid artery: Secondary | ICD-10-CM | POA: Insufficient documentation

## 2013-05-01 DIAGNOSIS — M545 Low back pain, unspecified: Secondary | ICD-10-CM | POA: Insufficient documentation

## 2013-05-01 DIAGNOSIS — H9190 Unspecified hearing loss, unspecified ear: Secondary | ICD-10-CM | POA: Insufficient documentation

## 2013-05-01 DIAGNOSIS — Y9301 Activity, walking, marching and hiking: Secondary | ICD-10-CM | POA: Insufficient documentation

## 2013-05-01 DIAGNOSIS — Z881 Allergy status to other antibiotic agents status: Secondary | ICD-10-CM | POA: Insufficient documentation

## 2013-05-01 DIAGNOSIS — S0990XA Unspecified injury of head, initial encounter: Secondary | ICD-10-CM | POA: Insufficient documentation

## 2013-05-01 DIAGNOSIS — Z87891 Personal history of nicotine dependence: Secondary | ICD-10-CM | POA: Insufficient documentation

## 2013-05-01 DIAGNOSIS — Z8589 Personal history of malignant neoplasm of other organs and systems: Secondary | ICD-10-CM | POA: Insufficient documentation

## 2013-05-01 DIAGNOSIS — R55 Syncope and collapse: Secondary | ICD-10-CM

## 2013-05-01 DIAGNOSIS — M129 Arthropathy, unspecified: Secondary | ICD-10-CM | POA: Insufficient documentation

## 2013-05-01 DIAGNOSIS — I120 Hypertensive chronic kidney disease with stage 5 chronic kidney disease or end stage renal disease: Secondary | ICD-10-CM | POA: Insufficient documentation

## 2013-05-01 DIAGNOSIS — Z5189 Encounter for other specified aftercare: Secondary | ICD-10-CM | POA: Insufficient documentation

## 2013-05-01 DIAGNOSIS — S20229A Contusion of unspecified back wall of thorax, initial encounter: Secondary | ICD-10-CM | POA: Insufficient documentation

## 2013-05-01 DIAGNOSIS — R51 Headache: Secondary | ICD-10-CM | POA: Insufficient documentation

## 2013-05-01 DIAGNOSIS — Z79899 Other long term (current) drug therapy: Secondary | ICD-10-CM | POA: Insufficient documentation

## 2013-05-01 DIAGNOSIS — K219 Gastro-esophageal reflux disease without esophagitis: Secondary | ICD-10-CM | POA: Insufficient documentation

## 2013-05-01 DIAGNOSIS — Z8639 Personal history of other endocrine, nutritional and metabolic disease: Secondary | ICD-10-CM | POA: Insufficient documentation

## 2013-05-01 DIAGNOSIS — S0100XA Unspecified open wound of scalp, initial encounter: Secondary | ICD-10-CM | POA: Insufficient documentation

## 2013-05-01 DIAGNOSIS — Z888 Allergy status to other drugs, medicaments and biological substances status: Secondary | ICD-10-CM | POA: Insufficient documentation

## 2013-05-01 DIAGNOSIS — IMO0001 Reserved for inherently not codable concepts without codable children: Secondary | ICD-10-CM | POA: Insufficient documentation

## 2013-05-01 DIAGNOSIS — N186 End stage renal disease: Secondary | ICD-10-CM | POA: Insufficient documentation

## 2013-05-01 DIAGNOSIS — S0003XA Contusion of scalp, initial encounter: Secondary | ICD-10-CM | POA: Insufficient documentation

## 2013-05-01 DIAGNOSIS — R404 Transient alteration of awareness: Secondary | ICD-10-CM | POA: Insufficient documentation

## 2013-05-01 DIAGNOSIS — G4730 Sleep apnea, unspecified: Secondary | ICD-10-CM | POA: Insufficient documentation

## 2013-05-01 LAB — COMPREHENSIVE METABOLIC PANEL
AST: 20 U/L (ref 0–37)
Albumin: 3.4 g/dL — ABNORMAL LOW (ref 3.5–5.2)
BUN: 16 mg/dL (ref 6–23)
Calcium: 8.9 mg/dL (ref 8.4–10.5)
Chloride: 96 mEq/L (ref 96–112)
Creatinine, Ser: 3.51 mg/dL — ABNORMAL HIGH (ref 0.50–1.35)
GFR calc Af Amer: 19 mL/min — ABNORMAL LOW (ref >90)
Glucose, Bld: 105 mg/dL — ABNORMAL HIGH (ref 70–99)
Sodium: 139 mEq/L (ref 135–145)
Total Bilirubin: 0.5 mg/dL (ref 0.3–1.2)
Total Protein: 6.9 g/dL (ref 6.0–8.3)

## 2013-05-01 LAB — CBC WITH DIFFERENTIAL/PLATELET
Basophils Absolute: 0 10*3/uL (ref 0.0–0.1)
Basophils Relative: 1 % (ref 0–1)
Eosinophils Relative: 3 % (ref 0–5)
HCT: 35.9 % — ABNORMAL LOW (ref 39.0–52.0)
MCHC: 30.6 g/dL (ref 30.0–36.0)
MCV: 96.8 fL (ref 78.0–100.0)
Monocytes Absolute: 0.4 10*3/uL (ref 0.1–1.0)
Neutro Abs: 4.6 10*3/uL (ref 1.7–7.7)
Platelets: 239 10*3/uL (ref 150–400)
RDW: 19.3 % — ABNORMAL HIGH (ref 11.5–15.5)

## 2013-05-01 LAB — PROTIME-INR
INR: 1.07 (ref 0.00–1.49)
Prothrombin Time: 13.7 seconds (ref 11.6–15.2)

## 2013-05-01 NOTE — ED Notes (Addendum)
Pt reports to the ED for eval of posterior head pain following a fall today at dialysis. Pt was standing after his dialysis tx and fell to the floor. Pt has a lemon sized hematoma to the posterior head and a small laceration noted over the contusion. Bleeding controlled. No LOC. Pt is not on blood thinners. Denies visual disturbances or nausea. No neuro deficits noted. SCCA was cleared by EMS. Pt A&O x4. Pt had his full dialysis tx. Pt denies any lightheadedness or dizziness. Pt reports his legs just gave out.

## 2013-05-01 NOTE — Telephone Encounter (Signed)
lmomtcb x2 for spouse

## 2013-05-01 NOTE — ED Provider Notes (Signed)
CSN: [id number]     Arrival date & time 05/01/13  1745 History   First MD Initiated Contact with Patient 05/01/13 1746     Chief Complaint  Patient presents with  . Fall    HPI  The patient presents to the emergency room after having a syncopal episode following dialysis. Patient had just finished his routine dialysis. He has been on dialysis now for a few months. Patient states he was walking out when he suddenly lost consciousness and fell to the ground. Patient struck the back of his head. He thinks he may have been unconscious for a few seconds. The patient has a slight headache now but denies any other pain. He denies any neck pain chest pain abdominal pain. He does not feel weak at this time. He did not really have any any warning signs that he was going to pass out.  Patient states he has been having trouble with weakness and has had syncope since his discharge from the hospital a few months ago. Patient states he seen his cardiologist. He is now going to be evaluated by a neurologist. They have been unable to determine the exact cause of these spells. He does have a wheelchair and walker to help get around and was not using it this afternoon when he had a syncopal episode. Past Medical History  Diagnosis Date  . Diabetes mellitus   . Hypertension   . GERD (gastroesophageal reflux disease)   . Fibromyalgia   . Chest pain   . Carotid artery occlusion   . Chronic kidney disease     - Stage 5- follwed by Dr Hyman Hopes  . Complication of anesthesia     agiation when he awaken  . Neuropathy   . Arthritis   . Sleep apnea     not on CPAP  . H/O vitamin D deficiency   . Hearing loss   . Hypercholesteremia   . Myalgia and myositis   . 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 S/P radiation therapy 01/24/2013-03/14/2013    Larynx/glottis / 63 Gy in 28 fractions   Past Surgical History  Procedure Laterality Date  . Cholecystectomy   1993  . Rotator cuff repair  07/06/2011    right  . Carotid endarterectomy Left   . Coolonoscopy    . Colonoscopy w/ biopsies and polypectomy      Hx: of  . Microlaryngoscopy with laser N/A 01/04/2013    Procedure: MICROLARYNGOSCOPY WITH BIOPSY/LASER;  Surgeon: Suzanna Obey, MD;  Location: [facility] OR;  Service: ENT;  Laterality: N/A;  . Av fistula placement Left 02/23/2013    Procedure: ARTERIOVENOUS (AV) FISTULA CREATION;  Surgeon: Fransisco Hertz, MD;  Location: [facility] OR;  Service: Vascular;  Laterality: Left;   Family History  Problem Relation Age of Onset  . Adopted: Yes   History  Substance Use Topics  . Smoking status: Former Smoker -- 2.00 packs/day for 31 years    Types: Cigarettes    Quit date: 06/23/1995  . Smokeless tobacco: Never Used  . Alcohol Use: No    Review of Systems  All other systems reviewed and are negative.    Allergies  Amoxicillin and Fentanyl  Home Medications   Current Outpatient Rx  Name  Route  Sig  Dispense  Refill  . aspirin 81 MG tablet   Oral   Take 81 mg by mouth daily.          Marland Kitchen  esomeprazole (NEXIUM) 40 MG capsule   Oral   Take 40 mg by mouth daily before breakfast.         . metoprolol tartrate (LOPRESSOR) 25 MG tablet   Oral   Take 0.5 tablets (12.5 mg total) by mouth 2 (two) times daily.         . multivitamin (RENA-VIT) TABS tablet   Oral   Take 1 tablet by mouth at bedtime.         . ranitidine (ZANTAC) 300 MG capsule   Oral   Take 300 mg by mouth at bedtime.         . traMADol (ULTRAM) 50 MG tablet   Oral   Take 50 mg by mouth every 4 (four) hours as needed for moderate pain.          . Venlafaxine HCl 37.5 MG TB24   Oral   Take 1 tablet by mouth daily.          . Vitamin D, Ergocalciferol, (DRISDOL) 50000 UNITS CAPS   Oral   Take 50,000 Units by mouth every 14 (fourteen) days. Next dose is due august 30th          BP 151/67  Pulse 88  Temp(Src) 97.9 F (36.6 C) (Oral)  Resp 20  SpO2 92% Physical  Exam  Nursing note and vitals reviewed. Constitutional: He is oriented to person, place, and time. He appears well-developed and well-nourished. No distress.  HENT:  Head: Normocephalic.  Right Ear: External ear normal.  Left Ear: External ear normal.  Mouth/Throat: Oropharynx is clear and moist.  Cephalhematoma left posterior occiput, small facial laceration overlying the hematoma, fresh blood, no active bleeding  Eyes: Conjunctivae are normal. Right eye exhibits no discharge. Left eye exhibits no discharge. No scleral icterus.  Neck: Neck supple. No tracheal deviation present.  Cardiovascular: Normal rate, regular rhythm and intact distal pulses.   Pulmonary/Chest: Effort normal and breath sounds normal. No stridor. No respiratory distress. He has no wheezes. He has no rales.  Abdominal: Soft. Bowel sounds are normal. He exhibits no distension. There is no tenderness. There is no rebound and no guarding.  Musculoskeletal: He exhibits no edema and no tenderness.  Tenderness palpation distal thoracic proximal lumbar spine region, old bruising noted  Neurological: He is alert and oriented to person, place, and time. He has normal strength. No sensory deficit. Cranial nerve deficit:  no gross defecits noted. He exhibits normal muscle tone. He displays no seizure activity. Coordination normal.  No pronator drift bilateral upper extrem, able to hold both legs off bed for 5 seconds, sensation intact in all extremities, no visual field cuts, no left or right sided neglect  Skin: Skin is warm and dry. No rash noted.  Psychiatric: He has a normal mood and affect.    ED Course  Procedures (including critical care time) Labs Review Labs Reviewed  CBC WITH DIFFERENTIAL - Abnormal; Notable for the following:    RBC 3.71 (*)    Hemoglobin 11.0 (*)    HCT 35.9 (*)    RDW 19.3 (*)    Neutrophils Relative % 80 (*)    Lymphocytes Relative 10 (*)    Lymphs Abs 0.6 (*)    All other components within  normal limits  PROTIME-INR  COMPREHENSIVE METABOLIC PANEL  POCT CBG (FASTING - GLUCOSE)-MANUAL ENTRY   Imaging Review No results found.  EKG Interpretation     Ventricular Rate:  89 PR Interval:  142 QRS Duration: 102 QT  Interval:  418 QTC Calculation: 509 R Axis:   90 Text Interpretation:  Sinus rhythm Probable left atrial enlargement Borderline right axis deviation Prolonged QT interval Since last tracing rate slower , svt resolved            MDM   1. Syncope    1920  The patients lab results have not returned.  He has not had his x-rays yet. Pt does not want to stay in the ED any longer.  He is frustrated by the wait.  He has been here for 90 minutes so far.  Pt has plans for outpatient follow up.  This is unfortunately not a new condition for him.  His wife is at the bedside and agrees.  Pt states he only came because his wife made him.  Pt appears to have capacity to make this decision.  Will dc ama.   Informed he could return if he changes his mind.     Celene Kras, MD 05/01/13 [phone]

## 2013-05-02 ENCOUNTER — Ambulatory Visit (INDEPENDENT_AMBULATORY_CARE_PROVIDER_SITE_OTHER): Payer: Medicare Other | Admitting: Diagnostic Neuroimaging

## 2013-05-02 ENCOUNTER — Encounter (INDEPENDENT_AMBULATORY_CARE_PROVIDER_SITE_OTHER): Payer: Self-pay | Admitting: Radiology

## 2013-05-02 DIAGNOSIS — M79609 Pain in unspecified limb: Secondary | ICD-10-CM

## 2013-05-02 DIAGNOSIS — Z0289 Encounter for other administrative examinations: Secondary | ICD-10-CM

## 2013-05-02 DIAGNOSIS — M545 Low back pain: Secondary | ICD-10-CM

## 2013-05-02 DIAGNOSIS — R29898 Other symptoms and signs involving the musculoskeletal system: Secondary | ICD-10-CM

## 2013-05-02 DIAGNOSIS — G629 Polyneuropathy, unspecified: Secondary | ICD-10-CM

## 2013-05-02 DIAGNOSIS — R5381 Other malaise: Secondary | ICD-10-CM

## 2013-05-02 NOTE — Procedures (Signed)
   [facility]  NCS (NERVE CONDUCTION STUDY) WITH EMG (ELECTROMYOGRAPHY) REPORT   STUDY DATE: 05/02/13 PATIENT NAME: Victor Lane DOB: [birth year] MRN: [id number]  ORDERING CLINICIAN: Joycelyn Schmid, MD   TECHNOLOGIST: Kaylyn Lim ELECTROMYOGRAPHER: Glenford Bayley. Penumalli, MD  CLINICAL INFORMATION: 67 year old male with ESRD on HD, with lower extremity pain, numbness, weakness and balance difficulty. Also with left hand numbness. MRI lumbar spine shows multi-level spinal stenosis.  FINDINGS: NERVE CONDUCTION STUDY: Left median motor response is borderline prolonged latency (4.3 ms), normal amplitude, normal conduction velocity and normal F-wave latency. Left ulnar motor responses normal distal latency, amplitude, conduction velocity and F-wave latency. Left peroneal and bilateral tibial motor responses have normal distal latency, decreased amplitudes, borderline slow conduction velocities (37 m) and prolonged F-wave latencies. Right peroneal motor responses normal distal latency, decreased amplitude, normal conduction velocity and prolonged F-wave latency.  Left median and left radial sensory responses are normal. Left ulnar sensory response has decreased amplitude and slow conduction velocity (43 m/s). Left sural sensory response has decreased amplitude and slow conduction velocity (36 m/s).  NEEDLE ELECTROMYOGRAPHY: Needle examination of selected muscles of the left lower extremity (vastus medialis, tibialis anterior, gastrocnemius) and left L4-5 and L5-S1 paraspinal muscles is normal. No abnormal spontaneous activity at rest and normal motor unit recruitment on exertion.  IMPRESSION:  Abnormal study demonstrating: 1. Moderate-severe, length-dependent, axonal sensorimotor polyneuropathy. 2. Mild left ulnar sensory neuropathy at wrist. 3. Mild left median motor neuropathy at the wrist.   INTERPRETING PHYSICIAN:  Suanne Marker, MD Certified in Neurology,  Neurophysiology and Neuroimaging  [facility] [address], [address] [place], [place] [address] [phone]

## 2013-05-02 NOTE — Telephone Encounter (Signed)
Order placed and pt spouse is aware. Jennifer Castillo, CMA  

## 2013-05-04 ENCOUNTER — Telehealth: Payer: Self-pay | Admitting: *Deleted

## 2013-05-08 NOTE — Telephone Encounter (Signed)
Received VM from pt wife in response to my 11/10 VM inquiry to see how pt was doing s/p treatments.  She indicated he is eating and sleeping well, "getting back to normal".  Will continue to navigate as L3 (treatments completed) patient.  Young Berry, RN, BSN, [facility] Navigator [phone]

## 2013-05-09 ENCOUNTER — Ambulatory Visit (INDEPENDENT_AMBULATORY_CARE_PROVIDER_SITE_OTHER): Payer: Medicare Other | Admitting: Internal Medicine

## 2013-05-09 ENCOUNTER — Encounter: Payer: Self-pay | Admitting: Internal Medicine

## 2013-05-09 ENCOUNTER — Telehealth: Payer: Self-pay | Admitting: *Deleted

## 2013-05-09 VITALS — BP 120/60 | HR 75 | Temp 97.5°F | Ht 70.0 in | Wt 175.0 lb

## 2013-05-09 DIAGNOSIS — J9610 Chronic respiratory failure, unspecified whether with hypoxia or hypercapnia: Secondary | ICD-10-CM

## 2013-05-09 DIAGNOSIS — M79642 Pain in left hand: Secondary | ICD-10-CM

## 2013-05-09 NOTE — Telephone Encounter (Signed)
I called patient's wife. Will refer for steroid injection for left hand/wrist pain.   Suanne Marker, MD 05/09/2013, 6:38 PM Certified in Neurology, Neurophysiology and Neuroimaging  [facility] [address], [address] [place], [place] [address] [phone]

## 2013-05-09 NOTE — Telephone Encounter (Signed)
I called and spoke to wife.  Pt in a lot of constant pain (l hand).  Taking tramadol prn (per pcp).  Worsens later in day (when fatigued) and after taking dialysis (MWFR).  Asking about steroid injection?  Please advise.  [phone].

## 2013-05-09 NOTE — Patient Instructions (Addendum)
I will notify Dr Eliott Nine re keeping the fluid off your lungs by keeping your volume status as low as you can tolerate  I would recommend you see Dr Mayford Knife to discuss recent echo   Keep using the incentive spirometer  Please see patient coordinator before you leave today  to schedule humidify 02  Definitely wear the 02 at night and any time your 02 level is lower than 90% during the day.   Please schedule a follow up office visit in 6 weeks, call sooner if needed pfts

## 2013-05-09 NOTE — Telephone Encounter (Signed)
Message copied by Hermenia Fiscal on Tue May 09, 2013  4:41 PM ------      Message from: Seth Bake      Created: Tue May 09, 2013  2:41 PM       Calling again.  Patient is in a lot of pain.  Please call.  ------

## 2013-05-09 NOTE — Progress Notes (Signed)
Subjective:    Patient ID: Victor Lane, male    DOB: 08-27-1945   MRN: [id number]   Brief patient profile:  33 yowm quit smoking 1997 with no sequelae and good activity tolerance by no aerobics until Summer 2014 when downhill with dx of esrf/ laryngeal ca on RT:  Admit date: 02/10/2013  Discharge date: 02/25/2013  Discharge Diagnoses:  Acute respiratory failure due to aspiration pneumonia and pulmonary edema  Laryngeal cancer  Trach status  PAF with RVR >> NSR  Hx of HTN.  ESRD on HD.  Hyperkalemia.  Dysphagia.  Hx of GERD.  Diarrhea >> improving.  S/P G tube 8/29.  Anemia of critical illness and chronic disease.  Hx of laryngeal cancer s/p XRT.  HCAP, aspiration  DM-2  Encephalopathy d/t uremia and hypoxemia, resolved.  Pain  Deconditioning.  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 Jearld Fenton, final pathology report 01/04/2013) currently undergoing radiation therapy under Dr. Basilio Cairo care, presented to [facility] emergency department 8/22 after found to be more lethargic at home by his wife. In ER pt explained he had felt progressively weaker over the past several days and unable to ambulate, went to bed the night before and wife was unable to wake him up that morning. Wife also explained patient has been more confused over the past several days. Patient also reported progressively worsening shortness of breath and feeling that he cannot take full deep breath, worse with exertion and somewhat improved with rest, 2-3 pillow orthopnea.He was first diagnosed with kidney disease about 7 years ago but has never had any workup done. In the emergency department,  patient found to be hypoxic with oxygen saturation in the 80s, improved with 4 L of nasal cannula. Electrolyte panel significant for hyperkalemia K = 7.0, creatinine equal 11.22. TRH asked to admit to [facility] hospital for further evaluation and management and due to hypoxia, SDU bed requested.  He was transferred to [facility] on 8/22 for HD and Permacath placement and emergent HD. PCCM was asked to see for progressive respiratory failure which required NIPPV. He received Emergent HD, also was placed on cycling NIPPV and oxygen, as well as empiric antibiotics to cover for possible aspiration pneumonia. On 8/24 he had acute worsening agitation and hypoxia. This did not improve with NIPPV and he was therefore intubated. He was noted to be difficult intubation due to his active anatomical changes from radiation and his cancer. He was supported on full vent support using low tidal volume ventilation. ENT was consulted and recommended tracheostomy. A size 8 cuffed shiley was placed on 8/26. On 8/26 we were able to start weaning efforts. We were able to d/c vent from room by 8/27 and continue ATC only. FEES study was performed on 8/27 and showed: Severe pharyngeal dysphagia w/ noted aspiration even with honey thick consistency liquids.  On 8/28 he had been moved to the SDU setting. His encephalopathy had resolved, he was off the vent 24/7. Afib w/ RVR which had occurred while acutely ill had resolved and he was in NSR, and he had completed his full course to therapy of antibiotics for aspiration PNA. Rehab services including PT/OT and SLp were following. He continued to  fail dysphagia screening and PEG was recommended. PEG tube was placed on 8/29. Later that day on 8/29 he was seen by vascular surgery for permament HD access/ fistula. Vein mapping was completed on 8/31. On 9/2 Nephrology raised concern about trach status and eligibility to go to dialysis center. Said he would have to be able to have trach capped during HD  treatments in order to be eligible (due to infection control policy). Because of this we decreased his trach from a 8 cuffless to 6 cuffless shiley and started red-cap trials. He had repeat SLP eval and MBS completed on 9/2 which showed marked improvement in swallowing function and a dysphagia 3/ mechanically soft diet was started w/ thin liquids as a trial. He had AVF scheduled for 9/3 and this was completed. Post-op had almost 1 gm hgb drift. Vascular followed for small hematoma. He did receive 2 units of blood on 9/5. He continued to improve over the following days and was deemed stable for discharge.  At time of discharge he has his trach capped during the day, all day, and he is tolerating room air 24 hours a day. He will go home with and continuing PT/OT. He is getting nutrition orally and is able to supplement via PEG if needed. He is undergoing scheduled HD treatments. He will be discharged to home with the following plan of care outlined below.    04/25/2013  Post hosp ov/Victor Lane re: weakness  Chief Complaint  Patient presents with  . Pulmonary Consult    Referred per Dr. Foy Guadalajara for eval of hypoxia. Pt's spouse reports he is restless at night and sats drop into the 70's. Pt reports his breathing usually only bothers him at night, but today he is having minimal DOE. Sats when he arrived 87%ra--increased to 92%ra with pursed lip breathing after appro 20 seconds.   at discharge was not on 02  rec Zantac 300 mg at bedtime GERDdiet   schedule 24 h 02 at 2lpm      05/09/2013 f/u ov/Victor Lane re: 02 dep resp failure/ asp/ vol overload Chief Complaint  Patient presents with  . Follow-up    Pt states breathing has improved some with o2 at night.   Only using 02 at hs  Has IS 1500-2000 In W/C due to leg cramping Room to room sob sometimes with desat  No obvious day to day or daytime variabilty or assoc chronic cough or cp or chest tightness, subjective wheeze overt sinus or hb symptoms. No unusual  exp hx or h/o childhood pna/ asthma or knowledge of premature birth.  Sleeping ok without nocturnal  or early am exacerbation  of respiratory  c/o's or need for noct saba. Also denies any obvious fluctuation of symptoms with weather or environmental changes or other aggravating or alleviating factors except as outlined above   Current Medications, Allergies, Complete Past Medical History, Past Surgical History, Family History, and Social History were reviewed in [employer] record.  ROS  The following are not active complaints unless bolded sore throat, dysphagia, dental problems, itching, sneezing,  nasal congestion or excess/ purulent secretions, ear ache,   fever, chills, sweats, unintended wt loss, pleuritic or exertional cp, hemoptysis,  orthopnea pnd or leg swelling, presyncope, palpitations, heartburn, abdominal pain, anorexia, nausea, vomiting, diarrhea  or change in bowel or urinary habits, change in stools or urine, dysuria,hematuria,  rash, arthralgias, visual complaints, headache, numbness weakness or ataxia or problems with walking or coordination,  change in mood/affect or memory.  Objective:   Physical Exam   W/c bound wm nad    Wt Readings from Last 3 Encounters:  05/09/13 175 lb (79.379 kg)  04/25/13 178 lb (80.74 kg)  04/19/13 190 lb (86.183 kg)        HEENT: nl dentition, turbinates, and orophanx. Nl external ear canals without cough reflex   NECK :  without JVD/Nodes/TM/ nl carotid upstrokes bilaterally/ trach site healing nicely   LUNGS: no acc muscle use, clear to A and P bilaterally without cough on insp or exp maneuvers   CV:  RRR  no s3 or murmur or increase in P2, trace bilateral  edema   ABD:  soft and nontender with nl excursion in the supine position. No bruits or organomegaly, bowel sounds nl  MS:  warm without deformities, calf tenderness, cyanosis or clubbing  SKIN: warm and dry without lesions         Echo 04/04/13 Left ventricle: The cavity size was normal. There was moderate focal basal and mild concentric hypertrophy. Systolic function was normal. Wall motion was normal; there were no regional wall motion abnormalities. Features are consistent with a pseudonormal left ventricular filling pattern, with concomitant abnormal relaxation and increased filling pressure (grade 2 diastolic dysfunction). - Aortic valve: Moderate thickening and calcification, consistent with sclerosis. Moderate regurgitation. - Mitral valve: Mild to moderate regurgitation. - Left atrium: The atrium was mildly dilated.  CXR  04/25/2013 :  Mild CHF with small pleural effusions.      Assessment & Plan:

## 2013-05-11 NOTE — Assessment & Plan Note (Signed)
-   started on 02 2lpm 24/7 04/25/2013 for sat 87% at rest RA  Improved to point where not using 02 at rest, mostly likely related to vol overload from esfr and diast heart failure/ MR  Will rec keep on dry side and f/u with pfts as planned    Each maintenance medication was reviewed in detail including most importantly the difference between maintenance and as needed and under what circumstances the prns are to be used.  Please see instructions for details which were reviewed in writing and the patient given a copy.

## 2013-05-22 ENCOUNTER — Inpatient Hospital Stay (HOSPITAL_COMMUNITY): Payer: Medicare Other

## 2013-05-22 ENCOUNTER — Inpatient Hospital Stay (HOSPITAL_COMMUNITY)
Admission: EM | Admit: 2013-05-22 | Discharge: 2013-05-24 | DRG: 682 | Disposition: A | Discharge status: Home / Self Care | Payer: Medicare Other | Attending: Internal Medicine | Admitting: Internal Medicine

## 2013-05-22 ENCOUNTER — Emergency Department (HOSPITAL_COMMUNITY): Payer: Medicare Other

## 2013-05-22 ENCOUNTER — Encounter (HOSPITAL_COMMUNITY): Payer: Self-pay | Admitting: Emergency Medicine

## 2013-05-22 DIAGNOSIS — N189 Chronic kidney disease, unspecified: Secondary | ICD-10-CM

## 2013-05-22 DIAGNOSIS — F3289 Other specified depressive episodes: Secondary | ICD-10-CM | POA: Diagnosis present

## 2013-05-22 DIAGNOSIS — K219 Gastro-esophageal reflux disease without esophagitis: Secondary | ICD-10-CM | POA: Diagnosis present

## 2013-05-22 DIAGNOSIS — K5900 Constipation, unspecified: Secondary | ICD-10-CM | POA: Diagnosis present

## 2013-05-22 DIAGNOSIS — R7401 Elevation of levels of liver transaminase levels: Secondary | ICD-10-CM | POA: Diagnosis present

## 2013-05-22 DIAGNOSIS — Z923 Personal history of irradiation: Secondary | ICD-10-CM

## 2013-05-22 DIAGNOSIS — M129 Arthropathy, unspecified: Secondary | ICD-10-CM | POA: Diagnosis present

## 2013-05-22 DIAGNOSIS — Z87891 Personal history of nicotine dependence: Secondary | ICD-10-CM

## 2013-05-22 DIAGNOSIS — N186 End stage renal disease: Secondary | ICD-10-CM | POA: Diagnosis present

## 2013-05-22 DIAGNOSIS — I10 Essential (primary) hypertension: Secondary | ICD-10-CM | POA: Diagnosis present

## 2013-05-22 DIAGNOSIS — R7402 Elevation of levels of lactic acid dehydrogenase (LDH): Secondary | ICD-10-CM | POA: Diagnosis present

## 2013-05-22 DIAGNOSIS — C320 Malignant neoplasm of glottis: Secondary | ICD-10-CM | POA: Diagnosis present

## 2013-05-22 DIAGNOSIS — I120 Hypertensive chronic kidney disease with stage 5 chronic kidney disease or end stage renal disease: Principal | ICD-10-CM | POA: Diagnosis present

## 2013-05-22 DIAGNOSIS — F329 Major depressive disorder, single episode, unspecified: Secondary | ICD-10-CM | POA: Diagnosis present

## 2013-05-22 DIAGNOSIS — E43 Unspecified severe protein-calorie malnutrition: Secondary | ICD-10-CM

## 2013-05-22 DIAGNOSIS — E7800 Pure hypercholesterolemia, unspecified: Secondary | ICD-10-CM | POA: Diagnosis present

## 2013-05-22 DIAGNOSIS — K7200 Acute and subacute hepatic failure without coma: Secondary | ICD-10-CM | POA: Diagnosis present

## 2013-05-22 DIAGNOSIS — N2581 Secondary hyperparathyroidism of renal origin: Secondary | ICD-10-CM | POA: Diagnosis present

## 2013-05-22 DIAGNOSIS — Z7982 Long term (current) use of aspirin: Secondary | ICD-10-CM

## 2013-05-22 DIAGNOSIS — E119 Type 2 diabetes mellitus without complications: Secondary | ICD-10-CM | POA: Diagnosis present

## 2013-05-22 DIAGNOSIS — E213 Hyperparathyroidism, unspecified: Secondary | ICD-10-CM

## 2013-05-22 DIAGNOSIS — E872 Acidosis: Secondary | ICD-10-CM

## 2013-05-22 DIAGNOSIS — Z992 Dependence on renal dialysis: Secondary | ICD-10-CM

## 2013-05-22 DIAGNOSIS — H9190 Unspecified hearing loss, unspecified ear: Secondary | ICD-10-CM | POA: Diagnosis present

## 2013-05-22 DIAGNOSIS — G629 Polyneuropathy, unspecified: Secondary | ICD-10-CM | POA: Diagnosis present

## 2013-05-22 DIAGNOSIS — D638 Anemia in other chronic diseases classified elsewhere: Secondary | ICD-10-CM

## 2013-05-22 DIAGNOSIS — E875 Hyperkalemia: Secondary | ICD-10-CM | POA: Diagnosis present

## 2013-05-22 DIAGNOSIS — D649 Anemia, unspecified: Secondary | ICD-10-CM | POA: Diagnosis present

## 2013-05-22 DIAGNOSIS — K759 Inflammatory liver disease, unspecified: Secondary | ICD-10-CM | POA: Diagnosis present

## 2013-05-22 DIAGNOSIS — G4730 Sleep apnea, unspecified: Secondary | ICD-10-CM | POA: Diagnosis present

## 2013-05-22 DIAGNOSIS — IMO0001 Reserved for inherently not codable concepts without codable children: Secondary | ICD-10-CM | POA: Diagnosis present

## 2013-05-22 LAB — CBC WITH DIFFERENTIAL/PLATELET
Basophils Absolute: 0 10*3/uL (ref 0.0–0.1)
HCT: 41.9 % (ref 39.0–52.0)
Hemoglobin: 12.9 g/dL — ABNORMAL LOW (ref 13.0–17.0)
Lymphocytes Relative: 9 % — ABNORMAL LOW (ref 12–46)
Lymphs Abs: 1.2 10*3/uL (ref 0.7–4.0)
MCHC: 30.8 g/dL (ref 30.0–36.0)
Monocytes Absolute: 1.5 10*3/uL — ABNORMAL HIGH (ref 0.1–1.0)
Monocytes Relative: 11 % (ref 3–12)
Neutro Abs: 10.9 10*3/uL — ABNORMAL HIGH (ref 1.7–7.7)
RBC: 4.3 MIL/uL (ref 4.22–5.81)
RDW: 18.7 % — ABNORMAL HIGH (ref 11.5–15.5)
WBC: 13.6 10*3/uL — ABNORMAL HIGH (ref 4.0–10.5)

## 2013-05-22 LAB — POCT I-STAT TROPONIN I: Troponin i, poc: 0.04 ng/mL (ref 0.00–0.08)

## 2013-05-22 LAB — COMPREHENSIVE METABOLIC PANEL
ALT: 2934 U/L — ABNORMAL HIGH (ref 0–53)
AST: 3592 U/L — ABNORMAL HIGH (ref 0–37)
BUN: 69 mg/dL — ABNORMAL HIGH (ref 6–23)
CO2: 19 mEq/L (ref 19–32)
Chloride: 84 mEq/L — ABNORMAL LOW (ref 96–112)
Creatinine, Ser: 8.42 mg/dL — ABNORMAL HIGH (ref 0.50–1.35)
GFR calc Af Amer: 7 mL/min — ABNORMAL LOW (ref >90)
GFR calc non Af Amer: 6 mL/min — ABNORMAL LOW (ref >90)
Glucose, Bld: 69 mg/dL — ABNORMAL LOW (ref 70–99)
Total Bilirubin: 1 mg/dL (ref 0.3–1.2)
Total Protein: 6.7 g/dL (ref 6.0–8.3)

## 2013-05-22 LAB — LIPASE, BLOOD: Lipase: 27 U/L (ref 11–59)

## 2013-05-22 LAB — PROTIME-INR
INR: 2.15 — ABNORMAL HIGH (ref 0.00–1.49)
Prothrombin Time: 23.3 seconds — ABNORMAL HIGH (ref 11.6–15.2)

## 2013-05-22 MED ORDER — SORBITOL 70 % SOLN: 30.0000 mL | Freq: Every day | Status: DC | PRN

## 2013-05-22 MED ORDER — SODIUM CHLORIDE 0.9 % IJ SOLN
3.0000 mL | Freq: Two times a day (BID) | INTRAMUSCULAR | Status: DC
  Administered 2013-05-23 – 2013-05-24 (×3): 3 mL via INTRAVENOUS

## 2013-05-22 MED ORDER — PANTOPRAZOLE SODIUM 40 MG PO TBEC
40.0000 mg | DELAYED_RELEASE_TABLET | Freq: Every day | ORAL | Status: DC
  Administered 2013-05-23 – 2013-05-24 (×2): 40 mg via ORAL
  Filled 2013-05-22 (×2): qty 1

## 2013-05-22 MED ORDER — DOXERCALCIFEROL 4 MCG/2ML IV SOLN
4.0000 ug | INTRAVENOUS | Status: DC
  Administered 2013-05-22 – 2013-05-24 (×2): 4 ug via INTRAVENOUS
  Filled 2013-05-22 (×2): qty 2

## 2013-05-22 MED ORDER — ONDANSETRON HCL 4 MG PO TABS
4.0000 mg | ORAL_TABLET | Freq: Four times a day (QID) | ORAL | Status: DC | PRN
  Administered 2013-05-23: 19:00:00 4 mg via ORAL
  Filled 2013-05-22: qty 1

## 2013-05-22 MED ORDER — SODIUM CHLORIDE 0.9 % IV SOLN
125.0000 mg | INTRAVENOUS | Status: DC
  Administered 2013-05-22 – 2013-05-24 (×2): 125 mg via INTRAVENOUS
  Filled 2013-05-22 (×3): qty 10

## 2013-05-22 MED ORDER — MORPHINE SULFATE 2 MG/ML IJ SOLN
1.0000 mg | INTRAMUSCULAR | Status: DC | PRN
  Administered 2013-05-24: 1 mg via INTRAVENOUS
  Filled 2013-05-22: qty 1

## 2013-05-22 MED ORDER — VENLAFAXINE HCL ER 37.5 MG PO TB24: 1.0000 | ORAL_TABLET | Freq: Every day | ORAL | Status: DC

## 2013-05-22 MED ORDER — VENLAFAXINE HCL ER 37.5 MG PO CP24
37.5000 mg | ORAL_CAPSULE | Freq: Every day | ORAL | Status: DC
  Administered 2013-05-23: 06:00:00 37.5 mg via ORAL
  Filled 2013-05-22 (×2): qty 1

## 2013-05-22 MED ORDER — HEPARIN SODIUM (PORCINE) 5000 UNIT/ML IJ SOLN
5000.0000 [IU] | Freq: Three times a day (TID) | INTRAMUSCULAR | Status: DC
  Administered 2013-05-23 – 2013-05-24 (×6): 5000 [IU] via SUBCUTANEOUS
  Filled 2013-05-22 (×9): qty 1

## 2013-05-22 MED ORDER — ONDANSETRON HCL 4 MG/2ML IJ SOLN
4.0000 mg | Freq: Once | INTRAMUSCULAR | Status: AC
  Administered 2013-05-22: 4 mg via INTRAVENOUS
  Filled 2013-05-22: qty 2

## 2013-05-22 MED ORDER — METOPROLOL TARTRATE 12.5 MG HALF TABLET
12.5000 mg | ORAL_TABLET | Freq: Two times a day (BID) | ORAL | Status: DC
  Administered 2013-05-23 – 2013-05-24 (×4): 12.5 mg via ORAL
  Filled 2013-05-22 (×5): qty 1

## 2013-05-22 MED ORDER — ALBUTEROL SULFATE (5 MG/ML) 0.5% IN NEBU: 2.5000 mg | INHALATION_SOLUTION | RESPIRATORY_TRACT | Status: DC | PRN

## 2013-05-22 MED ORDER — SODIUM CHLORIDE 0.9 % IV BOLUS (SEPSIS)
500.0000 mL | Freq: Once | INTRAVENOUS | Status: AC
  Administered 2013-05-22: 500 mL via INTRAVENOUS

## 2013-05-22 MED ORDER — SODIUM POLYSTYRENE SULFONATE 15 GM/60ML PO SUSP
15.0000 g | Freq: Once | ORAL | Status: AC
  Administered 2013-05-22: 15 g via ORAL
  Filled 2013-05-22: qty 60

## 2013-05-22 MED ORDER — ONDANSETRON HCL 4 MG/2ML IJ SOLN: 4.0000 mg | Freq: Four times a day (QID) | INTRAMUSCULAR | Status: DC | PRN

## 2013-05-22 MED ORDER — MORPHINE SULFATE 4 MG/ML IJ SOLN
4.0000 mg | Freq: Once | INTRAMUSCULAR | Status: AC
Start: 2013-05-22 — End: 2013-05-22
  Administered 2013-05-22: 4 mg via INTRAVENOUS
  Filled 2013-05-22: qty 1

## 2013-05-22 MED ORDER — BISACODYL 10 MG RE SUPP: 10.0000 mg | Freq: Every day | RECTAL | Status: DC | PRN

## 2013-05-22 NOTE — ED Notes (Signed)
Dr. Sullivan at bedside

## 2013-05-22 NOTE — H&P (Addendum)
[facility] History and Physical  ELNATHAN FULFORD WUJ:[id number] DOB: 11-18-1945 DOA: 05/22/2013  Referring physician: Lynelle Doctor PCP: Lenora Boys, MD   Chief Complaint: nausea  HPI: JAMOND NEELS is a 67 y.o. male with multiple medical problems including ESRD, SSCA of vocal cords s/p XRT, HTN, depression, neuropathy, DM, recent prolonged hospitalization for respiratory failure, presents with several day h/o nausea, abdominal pain, constipation. No f/c. Has fallen multiple times in the past few weeks.  No dyspnea or swelling.  HD q MWF.  In ED, potassium 6.3 and transaminases around 3000.  Bilirubin normal. Alk phos 145.  No h/o heavy alcohol, medication changes, viral hepatitis. No jaundice or bleeding. S/p cholecystectomy.  Pt received kayexalate in ED, and 500 cc bolus ordered, which I have stopped.  Nephrology is here evaluating the patient, and has written orders for HD.  EDP has ordered RUQ [place] which is pending. Makes some urine. No dysuria. No h/o heavy tylenol  Review of Systems: systems reviewed. As above, otherwise negative  Past Medical History  Diagnosis Date  . Diabetes mellitus   . Hypertension   . GERD (gastroesophageal reflux disease)   . Fibromyalgia   . Chest pain   . Carotid artery occlusion   . Chronic kidney disease     - Stage 5- follwed by Dr Hyman Hopes  . Complication of anesthesia     agiation when he awaken  . Neuropathy   . Arthritis   . Sleep apnea     not on CPAP  . H/O vitamin D deficiency   . Hearing loss   . Hypercholesteremia   . Myalgia and myositis   . 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 S/P radiation therapy 01/24/2013-03/14/2013    Larynx/glottis / 63 Gy in 28 fractions   Past Surgical History  Procedure Laterality Date  . Cholecystectomy  1993  . Rotator cuff repair  07/06/2011    right  . Carotid endarterectomy Left   . Coolonoscopy    . Colonoscopy w/ biopsies and polypectomy       Hx: of  . Microlaryngoscopy with laser N/A 01/04/2013    Procedure: MICROLARYNGOSCOPY WITH BIOPSY/LASER;  Surgeon: Suzanna Obey, MD;  Location: [facility] OR;  Service: ENT;  Laterality: N/A;  . Av fistula placement Left 02/23/2013    Procedure: ARTERIOVENOUS (AV) FISTULA CREATION;  Surgeon: Fransisco Hertz, MD;  Location: [facility];  Service: Vascular;  Laterality: Left;   Social History:  reports that he quit smoking about 17 years ago. His smoking use included Cigarettes. He has a 62 pack-year smoking history. He has never used smokeless tobacco. He reports that he does not drink alcohol or use illicit drugs. Married.  Lives at home. Currently getting home PT  Allergies  Allergen Reactions  . Amoxicillin     Causes upset stomach  . Fentanyl     Confusion when patch applied    Family History  Problem Relation Age of Onset  . Adopted: Yes    Prior to Admission medications   Medication Sig Start Date End Date Taking? Authorizing Provider  aspirin 81 MG tablet Take 81 mg by mouth daily.    Yes Historical Provider, MD  calcium acetate (PHOSLO) 667 MG capsule Take 1,334 mg by mouth 3 (three) times daily with meals.   Yes Historical Provider, MD  metoprolol tartrate (LOPRESSOR) 25 MG tablet Take 0.5 tablets (12.5 mg total) by mouth 2 (  two) times daily. 02/25/13  Yes Tammy S Parrett, NP  multivitamin (RENA-VIT) TABS tablet Take 1 tablet by mouth at bedtime.   Yes Historical Provider, MD  ranitidine (ZANTAC) 300 MG capsule Take 300 mg by mouth at bedtime.   Yes Historical Provider, MD  traMADol (ULTRAM) 50 MG tablet Take 50 mg by mouth every 4 (four) hours as needed for moderate pain.    Yes Historical Provider, MD  Venlafaxine HCl 37.5 MG TB24 Take 1 tablet by mouth daily.  01/10/13  Yes Historical Provider, MD  esomeprazole (NEXIUM) 40 MG capsule Take 40 mg by mouth daily before breakfast.    Historical Provider, MD   Physical Exam: Filed Vitals:   05/22/13 1700  BP: 132/64  Pulse: 87  Temp:    Resp: 21   BP 132/64  Pulse 87  Temp(Src) 98.1 F (36.7 C)  Resp 21  Ht 5\' 10"  (1.778 m)  Wt 81.647 kg (180 lb)  BMI 25.83 kg/m2  SpO2 98%  General Appearance:    Groggy. Chronically ill appearing, uncomfortable  Head:    Normocephalic, without obvious abnormality, atraumatic  Eyes:    PERRL, conjunctiva/corneas clear, EOM's intact, fundi    benign, both eyes          Nose:   Nares normal, septum midline, mucosa normal, no drainage   or sinus tenderness  Throat:   Dry mucous membranes  Neck:   Tracheostomy scar. supple  Back:     Symmetric, no curvature, ROM normal, no CVA tenderness  Lungs:     Clear to auscultation bilaterally, respirations unlabored  Chest wall:    Tender to palpation left chest, but no bruising or deformity  Heart:    Regular rate and rhythm, S1 and S2 normal, no murmur, rub   or gallop  Abdomen:     Soft, diffusely tender, but most pronounced RUQ. No ascited.  BS present  Genitalia:    Deferred  Rectal:    Deferred  Extremities:   Extremities normal, atraumatic, no cyanosis or edema  Pulses:   2+ and symmetric all extremities  Skin:   Dry. Poor turgor  Lymph nodes:   Cervical, supraclavicular, and axillary nodes normal  Neurologic:   CNII-XII intact. Normal strength, sensation and reflexes      throughout    Labs on Admission:  Basic Metabolic Panel:  Recent Labs Lab 05/22/13 1201  NA 132*  K 6.3*  CL 84*  CO2 19  GLUCOSE 69*  BUN 69*  CREATININE 8.42*  CALCIUM 9.4   Liver Function Tests:  Recent Labs Lab 05/22/13 1201  AST 3592*  ALT 2934*  ALKPHOS 145*  BILITOT 1.0  PROT 6.7  ALBUMIN 3.5    Recent Labs Lab 05/22/13 1201  LIPASE 27   No results found for this basename: AMMONIA,  in the last 168 hours CBC:  Recent Labs Lab 05/22/13 1201  WBC 13.6*  NEUTROABS 10.9*  HGB 12.9*  HCT 41.9  MCV 97.4  PLT 200   Cardiac Enzymes: No results found for this basename: CKTOTAL, CKMB, CKMBINDEX, TROPONINI,  in the last 168  hours  BNP (last 3 results) No results found for this basename: PROBNP,  in the last 8760 hours CBG: No results found for this basename: GLUCAP,  in the last 168 hours  Radiological Exams on Admission: Dg Abd Acute W/chest  05/22/2013   CLINICAL DATA:  Abdominal pain, shortness of breath, nausea, dialysis dependent  EXAM: ACUTE ABDOMEN SERIES (ABDOMEN 2 VIEW &  CHEST 1 VIEW)  COMPARISON:  04/25/2013, 02/14/2013  FINDINGS: Small pleural effusions noted. Minor basilar atelectasis. Heart is enlarged. No definite CHF or focal pneumonia. No pneumothorax. Trachea is midline. Right IJ dialysis catheter tips in the mid SVC. No free air evident.  Nonobstructive bowel gas pattern. Air and stool within the colon. Prior cholecystectomy noted. Atherosclerosis of the abdominal vasculature. No abnormal osseous finding.  IMPRESSION: Small pleural effusions and basilar atelectasis  Cardiomegaly without CHF or pneumonia  Negative for obstruction or free air   Electronically Signed   By: Ruel Favors M.D.   On: 05/22/2013 13:03    EKG: Normal sinus rhythm Prolonged QT Abnormal ECG  Assessment/Plan   Hyperkalemia:  HD tonight. telemetry   Hepatitis: etiology unclear. Not jaundiced. Await [place] results.  Acute hepatitis panel ordered.  No known hypotension to suggest shock liver.  Not on hepatotoxic agents.  Check coags   Glottis carcinoma   Diabetes mellitus: diet controlled   HTN (hypertension)   ESRD (end stage renal disease)   Neuropathy Nausea and abdominal pain: see above.  Check UA as well  Code Status: full Family Communication: wife at bedside Disposition Plan: home  Time spent: 60 min  Christiane Ha [facility] Pager [phone]  If 7PM-7AM, please contact night-coverage [email or web address] Password [facility] 05/22/2013, 6:13 PM

## 2013-05-22 NOTE — ED Notes (Signed)
Pt is here with upper abdominal pain and tender.  Pt is dialysis patient.  Not sob.  No vomiting but nauseated.   Pt is constipated for last 6 days

## 2013-05-22 NOTE — ED Provider Notes (Signed)
Medical screening examination/treatment/procedure(s) were performed by non-physician practitioner and as supervising physician I was immediately available for consultation/collaboration.  EKG Interpretation    Date/Time:  Monday May 22 2013 11:54:33 EST Ventricular Rate:  91 PR Interval:  152 QRS Duration: 112 QT Interval:  414 QTC Calculation: 509 R Axis:   88 Text Interpretation:  Normal sinus rhythm Prolonged QT Abnormal ECG No significant change since last tracing Confirmed by Cordarrel Stiefel  MD-J, Genene Kilman ([id number]) on 05/22/2013 12:31:57 PM            Patient presents to emergency room with complaints of generalized abdominal discomfort. Has also felt nauseated over the last few days but no vomiting. He's had constipation for the last 6 days as well. Patient denies fevers. He denies any new medications. Denies any Tylenol use.  On exam the patient does have mild tenderness in his right upper quadrant.  Patient laboratory tests show significantly elevated LFTs.  Patient states he's had his gallbladder removed  Plan on ultrasound imaging to evaluate for possible obstructive process. The patient will likely need viral hepatitis panel as well.  Nephrology has been consulted.  Medical admission for further evaluation.  Celene Kras, MD 05/22/13 [phone]

## 2013-05-22 NOTE — ED Notes (Signed)
K+ 6.3 called to Dr. Lynelle Doctor

## 2013-05-22 NOTE — ED Provider Notes (Signed)
CSN: [id number]     Arrival date & time 05/22/13  1141 History   First MD Initiated Contact with Patient 05/22/13 1159     Chief Complaint  Patient presents with  . Abdominal Pain   (Consider location/radiation/quality/duration/timing/severity/associated sxs/prior Treatment) HPI Comments: Patient is a 67 year old male with a past medical history of ESRD on dialysis MWF, glottis carcinoma, diabetes, hypertension who presents with abdominal pain for the past week. Symptoms started gradually and progressively worsened since the onset. The pain is located in his upper abdomen and does not radiate. The pain is aching and severe. Patient reports associated nausea and decreased appetite. No aggravating/alleviating factors. No other associated symptoms such as fever, chest pain, SOB.    Past Medical History  Diagnosis Date  . Diabetes mellitus   . Hypertension   . GERD (gastroesophageal reflux disease)   . Fibromyalgia   . Chest pain   . Carotid artery occlusion   . Chronic kidney disease     - Stage 5- follwed by Dr Hyman Hopes  . Complication of anesthesia     agiation when he awaken  . Neuropathy   . Arthritis   . Sleep apnea     not on CPAP  . H/O vitamin D deficiency   . Hearing loss   . Hypercholesteremia   . Myalgia and myositis   . 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 S/P radiation therapy 01/24/2013-03/14/2013    Larynx/glottis / 63 Gy in 28 fractions   Past Surgical History  Procedure Laterality Date  . Cholecystectomy  1993  . Rotator cuff repair  07/06/2011    right  . Carotid endarterectomy Left   . Coolonoscopy    . Colonoscopy w/ biopsies and polypectomy      Hx: of  . Microlaryngoscopy with laser N/A 01/04/2013    Procedure: MICROLARYNGOSCOPY WITH BIOPSY/LASER;  Surgeon: Suzanna Obey, MD;  Location: [facility] OR;  Service: ENT;  Laterality: N/A;  . Av fistula placement Left 02/23/2013    Procedure: ARTERIOVENOUS (AV)  FISTULA CREATION;  Surgeon: Fransisco Hertz, MD;  Location: [facility] OR;  Service: Vascular;  Laterality: Left;   Family History  Problem Relation Age of Onset  . Adopted: Yes   History  Substance Use Topics  . Smoking status: Former Smoker -- 2.00 packs/day for 31 years    Types: Cigarettes    Quit date: 06/23/1995  . Smokeless tobacco: Never Used  . Alcohol Use: No    Review of Systems  Constitutional: Negative for fever, chills and fatigue.  HENT: Negative for trouble swallowing.   Eyes: Negative for visual disturbance.  Respiratory: Negative for shortness of breath.   Cardiovascular: Negative for chest pain and palpitations.  Gastrointestinal: Positive for nausea, abdominal pain and constipation. Negative for vomiting and diarrhea.  Genitourinary: Negative for dysuria and difficulty urinating.  Musculoskeletal: Negative for arthralgias and neck pain.  Skin: Negative for color change.  Neurological: Negative for dizziness and weakness.  Psychiatric/Behavioral: Negative for dysphoric mood.    Allergies  Amoxicillin and Fentanyl  Home Medications   Current Outpatient Rx  Name  Route  Sig  Dispense  Refill  . aspirin 81 MG tablet   Oral   Take 81 mg by mouth daily.          . calcium acetate (PHOSLO) 667 MG capsule   Oral   Take 1,334 mg by mouth 3 (three) times daily  with meals.         . metoprolol tartrate (LOPRESSOR) 25 MG tablet   Oral   Take 0.5 tablets (12.5 mg total) by mouth 2 (two) times daily.         . multivitamin (RENA-VIT) TABS tablet   Oral   Take 1 tablet by mouth at bedtime.         . ranitidine (ZANTAC) 300 MG capsule   Oral   Take 300 mg by mouth at bedtime.         . traMADol (ULTRAM) 50 MG tablet   Oral   Take 50 mg by mouth every 4 (four) hours as needed for moderate pain.          . Venlafaxine HCl 37.5 MG TB24   Oral   Take 1 tablet by mouth daily.          Marland Kitchen esomeprazole (NEXIUM) 40 MG capsule   Oral   Take 40 mg by  mouth daily before breakfast.          BP 117/94  Pulse 90  Temp(Src) 98.1 F (36.7 C)  Resp 26  Ht 5\' 10"  (1.778 m)  Wt 180 lb (81.647 kg)  BMI 25.83 kg/m2  SpO2 92% Physical Exam  Nursing note and vitals reviewed. Constitutional: He is oriented to person, place, and time. He appears well-developed and well-nourished. No distress.  HENT:  Head: Normocephalic and atraumatic.  Eyes: Conjunctivae and EOM are normal.  Neck: Normal range of motion.  Cardiovascular: Normal rate and regular rhythm.  Exam reveals no gallop and no friction rub.   No murmur heard. Pulmonary/Chest: Effort normal and breath sounds normal. He has no wheezes. He has no rales. He exhibits no tenderness.  Abdominal: Soft. He exhibits no distension. There is tenderness. There is no rebound and no guarding.  Upper abdominal tenderness to palpation. No peritoneal signs or focal tenderness to palpation.   Musculoskeletal: Normal range of motion.  Neurological: He is alert and oriented to person, place, and time. Coordination normal.  Speech is goal-oriented. Moves limbs without ataxia.   Skin: Skin is warm and dry.  Psychiatric: He has a normal mood and affect. His behavior is normal.    ED Course  Procedures (including critical care time) Labs Review Labs Reviewed  CBC WITH DIFFERENTIAL  COMPREHENSIVE METABOLIC PANEL  LIPASE, BLOOD  URINALYSIS, ROUTINE W REFLEX MICROSCOPIC   Imaging Review Dg Abd Acute W/chest  05/22/2013   CLINICAL DATA:  Abdominal pain, shortness of breath, nausea, dialysis dependent  EXAM: ACUTE ABDOMEN SERIES (ABDOMEN 2 VIEW & CHEST 1 VIEW)  COMPARISON:  04/25/2013, 02/14/2013  FINDINGS: Small pleural effusions noted. Minor basilar atelectasis. Heart is enlarged. No definite CHF or focal pneumonia. No pneumothorax. Trachea is midline. Right IJ dialysis catheter tips in the mid SVC. No free air evident.  Nonobstructive bowel gas pattern. Air and stool within the colon. Prior  cholecystectomy noted. Atherosclerosis of the abdominal vasculature. No abnormal osseous finding.  IMPRESSION: Small pleural effusions and basilar atelectasis  Cardiomegaly without CHF or pneumonia  Negative for obstruction or free air   Electronically Signed   By: Ruel Favors M.D.   On: 05/22/2013 13:03    EKG Interpretation    Date/Time:  Monday May 22 2013 11:54:33 EST Ventricular Rate:  91 PR Interval:  152 QRS Duration: 112 QT Interval:  414 QTC Calculation: 509 R Axis:   88 Text Interpretation:  Normal sinus rhythm Prolonged QT Abnormal ECG No significant  change since last tracing Confirmed by KNAPP  MD-J, JON ([id number]) on 05/22/2013 12:31:57 PM            MDM   1. Hepatitis   2. Chronic renal failure   3. Hyperkalemia   4. ESRD (end stage renal disease)   5. HTN (hypertension)     1:13 PM Labs pending. Patient will have morphine and zofran for symptoms. Acute abdominal series unremarkable for acute changes. Vitals stable and patient afebrile.   Patient's potassium is 6.3. No EKG changes. Patient given bolus and kayexalate. Patient's LFTs significantly elevated, indicating acute hepatitis. Patient will be admitted.   Emilia Beck, PA-C 05/22/13 1844

## 2013-05-22 NOTE — Procedures (Signed)
Pt seen on HD.  Ap 130  Vp 170.  SBP 136.  Will change UF to 2 liters.  On 0 K bath for .

## 2013-05-22 NOTE — Consult Note (Signed)
[facility] Renal Consultation Note  Indication for Consultation:  Management of ESRD/hemodialysis; anemia, hypertension/volume and secondary hyperparathyroidism  HPI: Victor Lane is a 67 y.o. 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  He reports associated nausea and poor appetite, but denies vomiting, dyspnea, fever, or chills.  He reports that his last bowel movement was at least one week ago.  He reports frequent fatigue post-dialysis, but generally has moderate fluid gains and reaches goal.  Abdominal series showed small pleural effusions, but no CHF and no abdominal obstruction or free air.  His potassium is elevated at 6.3, but his liver enzymes are also significantly elevated.  He will be admitted for workup and will receive dialysis today.   Dialysis Orders:   MWF @ [facility] 78 kg    4 hrs    400/A1.5     2K/2.25Ca      Heparin 2400 U     R IJ catheter, maturing AVF @ LUA (placed 9/4 by Dr. Imogene Burn) Hectorol 4 mcg      Epogen 0     Venofer 100 mg x 10 (through 12/15)  Past Medical History  Diagnosis Date  . Diabetes mellitus   . Hypertension   . GERD (gastroesophageal reflux disease)   . Fibromyalgia   . Chest pain   . Carotid artery occlusion   . Chronic kidney disease     - Stage 5- follwed by Dr Hyman Hopes  . Complication of anesthesia     agiation when he awaken  . Neuropathy   . Arthritis   . Sleep apnea     not on CPAP  . H/O vitamin D deficiency   . Hearing loss   . Hypercholesteremia   . Myalgia and myositis   . 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 S/P radiation therapy 01/24/2013-03/14/2013    Larynx/glottis / 63 Gy in 28 fractions   Past Surgical History  Procedure Laterality  Date  . Cholecystectomy  1993  . Rotator cuff repair  07/06/2011    right  . Carotid endarterectomy Left   . Coolonoscopy    . Colonoscopy w/ biopsies and polypectomy      Hx: of  . Microlaryngoscopy with laser N/A 01/04/2013    Procedure: MICROLARYNGOSCOPY WITH BIOPSY/LASER;  Surgeon: Suzanna Obey, MD;  Location: [facility] OR;  Service: ENT;  Laterality: N/A;  . Av fistula placement Left 02/23/2013    Procedure: ARTERIOVENOUS (AV) FISTULA CREATION;  Surgeon: Fransisco Hertz, MD;  Location: [facility] OR;  Service: Vascular;  Laterality: Left;   Family History  Problem Relation Age of Onset  . Adopted: Yes   Social History He quit smoking cigarettes (as much as 2 packs a day) in 1993 and denies any alcohol or illicit drug use.  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  Allergies  Allergen Reactions  . Amoxicillin     Causes upset stomach  . Fentanyl     Confusion when patch applied   Prior to Admission medications   Medication Sig Start Date End Date Taking? Authorizing Provider  aspirin 81 MG tablet Take 81 mg by mouth daily.    Yes Historical Provider, MD  calcium  acetate (PHOSLO) 667 MG capsule Take 1,334 mg by mouth 3 (three) times daily with meals.   Yes Historical Provider, MD  metoprolol tartrate (LOPRESSOR) 25 MG tablet Take 0.5 tablets (12.5 mg total) by mouth 2 (two) times daily. 02/25/13  Yes Tammy S Parrett, NP  multivitamin (RENA-VIT) TABS tablet Take 1 tablet by mouth at bedtime.   Yes Historical Provider, MD  ranitidine (ZANTAC) 300 MG capsule Take 300 mg by mouth at bedtime.   Yes Historical Provider, MD  traMADol (ULTRAM) 50 MG tablet Take 50 mg by mouth every 4 (four) hours as needed for moderate pain.    Yes Historical Provider, MD  Venlafaxine HCl 37.5 MG TB24 Take 1 tablet by mouth daily.  01/10/13  Yes Historical Provider, MD  esomeprazole (NEXIUM) 40 MG capsule Take 40 mg by mouth daily before breakfast.    Historical Provider, MD   Labs:  Results  for orders placed during the hospital encounter of 05/22/13 (from the past 48 hour(s))  CBC WITH DIFFERENTIAL     Status: Abnormal   Collection Time    05/22/13 12:01 PM      Result Value Range   WBC 13.6 (*) 4.0 - 10.5 K/uL   RBC 4.30  4.22 - 5.81 MIL/uL   Hemoglobin 12.9 (*) 13.0 - 17.0 g/dL   HCT 10.9  32.3 - 55.7 %   MCV 97.4  78.0 - 100.0 fL   MCH 30.0  26.0 - 34.0 pg   MCHC 30.8  30.0 - 36.0 g/dL   RDW 32.2 (*) 02.5 - 42.7 %   Platelets 200  150 - 400 K/uL   Neutrophils Relative % 80 (*) 43 - 77 %   Neutro Abs 10.9 (*) 1.7 - 7.7 K/uL   Lymphocytes Relative 9 (*) 12 - 46 %   Lymphs Abs 1.2  0.7 - 4.0 K/uL   Monocytes Relative 11  3 - 12 %   Monocytes Absolute 1.5 (*) 0.1 - 1.0 K/uL   Eosinophils Relative 0  0 - 5 %   Eosinophils Absolute 0.0  0.0 - 0.7 K/uL   Basophils Relative 0  0 - 1 %   Basophils Absolute 0.0  0.0 - 0.1 K/uL  COMPREHENSIVE METABOLIC PANEL     Status: Abnormal   Collection Time    05/22/13 12:01 PM      Result Value Range   Sodium 132 (*) 135 - 145 mEq/L   Potassium 6.3 (*) 3.5 - 5.1 mEq/L   Comment: CRITICAL RESULT CALLED TO, READ BACK BY AND VERIFIED WITH:     S NEWSOME,RN 1610 05/22/13 D BRADLEY   Chloride 84 (*) 96 - 112 mEq/L   CO2 19  19 - 32 mEq/L   Glucose, Bld 69 (*) 70 - 99 mg/dL   BUN 69 (*) 6 - 23 mg/dL   Creatinine, Ser 0.62 (*) 0.50 - 1.35 mg/dL   Calcium 9.4  8.4 - 37.6 mg/dL   Total Protein 6.7  6.0 - 8.3 g/dL   Albumin 3.5  3.5 - 5.2 g/dL   AST 2831 (*) 0 - 37 U/L   ALT 2934 (*) 0 - 53 U/L   Alkaline Phosphatase 145 (*) 39 - 117 U/L   Total Bilirubin 1.0  0.3 - 1.2 mg/dL   GFR calc non Af Amer 6 (*) >90 mL/min   GFR calc Af Amer 7 (*) >90 mL/min   Comment: (NOTE)     The eGFR has been calculated using the CKD  EPI equation.     This calculation has not been validated in all clinical situations.     eGFR's persistently <90 mL/min signify possible Chronic Kidney     Disease.  LIPASE, BLOOD     Status: None   Collection Time     05/22/13 12:01 PM      Result Value Range   Lipase 27  11 - 59 U/L  POCT I-STAT TROPONIN I     Status: None   Collection Time    05/22/13  1:53 PM      Result Value Range   Troponin i, poc 0.04  0.00 - 0.08 ng/mL   Comment 3            Comment: Due to the release kinetics of cTnI,     a negative result within the first hours     of the onset of symptoms does not rule out     myocardial infarction with certainty.     If myocardial infarction is still suspected,     repeat the test at appropriate intervals.   Constitutional: positive for fatigue, negative for chills, fevers and sweats Ears, nose, mouth, throat, and face: negative for earaches, hoarseness, nasal congestion and sore throat Respiratory: negative for cough, dyspnea on exertion, hemoptysis, pleurisy/chest pain and sputum Cardiovascular: negative for chest pain, chest pressure/discomfort, dyspnea, orthopnea and palpitations Gastrointestinal: positive for abdominal pain, constipation and nausea, negative for vomiting Genitourinary:negative, oliguric Musculoskeletal:negative for arthralgias, back pain, myalgias and neck pain Neurological: negative for dizziness, gait problems, headaches, paresthesia and speech problems  Physical Exam: Filed Vitals:   05/22/13 1530  BP: 141/70  Pulse: 90  Temp:   Resp: 17     General appearance: alert, cooperative and no distress Head: Normocephalic, without obvious abnormality, atraumatic Neck: no adenopathy, no carotid bruit, no JVD and supple, symmetrical, trachea midline Resp: clear to auscultation bilaterally Cardio: regular rate and rhythm, S1, S2 normal, no murmur, click, rub or gallop GI: + BS, soft with diffuse tenderness Extremities: extremities normal, atraumatic, no cyanosis or edema Neurologic: Grossly normal Dialysis Access: R IJ catheter, AVF @ LUA with + bruit  ABD:  + guarding, no rebound  Assessment/Plan: 1. Abdominal pain - Abdominal series showed no obstruction  or free air, LFTs significantly elevated, last BM 1 wk ago; workup pending. 2. Hyperkalemia - K elevated at 6.3.  Lower K bath with HD. 3. ESRD - HD on MWF @ [facility].  HD pending. 4. Hypertension/volume - BP 132/64 on Metoprolol 12.5 mg bid; 3.5 L over EDW. 5. Anemia - Hgb 12.9, on Venofer series for T-sat 24%. 6. Metabolic bone disease - Ca 9.4 (9.8 corrected), last P 7, last iPTH up to 1042, although Hectorol was increased to 4 mcg; Phoslo 2 with meals. 7. Nutrition - Alb 3.5, renal vitamin. 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   LYLES,CHARLES 05/22/2013, 5:38 PM   I have seen and examined this patient and agree with plan per Gerome Apley.  67yo WM with ESRD admitted with 5-6 days abd pain and constipation.  LFT's markedly increased with normal lipase.  Denies ETOH.  Last HD was last wed,  K 6.3.  Will plan HD tonight.  Abd US unremarkable.  Await acute hepatitis screen.  Recheck LFT's in AM. Anurag Scarfo T,MD 05/22/2013 7:27 PM

## 2013-05-23 DIAGNOSIS — D638 Anemia in other chronic diseases classified elsewhere: Secondary | ICD-10-CM

## 2013-05-23 DIAGNOSIS — E872 Acidosis: Secondary | ICD-10-CM

## 2013-05-23 LAB — COMPREHENSIVE METABOLIC PANEL
ALT: 2850 U/L — ABNORMAL HIGH (ref 0–53)
AST: 2701 U/L — ABNORMAL HIGH (ref 0–37)
Albumin: 3.1 g/dL — ABNORMAL LOW (ref 3.5–5.2)
Alkaline Phosphatase: 138 U/L — ABNORMAL HIGH (ref 39–117)
GFR calc Af Amer: 13 mL/min — ABNORMAL LOW (ref >90)
Glucose, Bld: 122 mg/dL — ABNORMAL HIGH (ref 70–99)
Potassium: 3.9 mEq/L (ref 3.5–5.1)
Sodium: 137 mEq/L (ref 135–145)
Total Bilirubin: 0.9 mg/dL (ref 0.3–1.2)
Total Protein: 5.9 g/dL — ABNORMAL LOW (ref 6.0–8.3)

## 2013-05-23 LAB — GLUCOSE, CAPILLARY
Glucose-Capillary: 167 mg/dL — ABNORMAL HIGH (ref 70–99)
Glucose-Capillary: 127 mg/dL — ABNORMAL HIGH (ref 70–99)
Glucose-Capillary: 157 mg/dL — ABNORMAL HIGH (ref 70–99)
Glucose-Capillary: 60 mg/dL — ABNORMAL LOW (ref 70–99)
Glucose-Capillary: 78 mg/dL (ref 70–99)
Glucose-Capillary: 88 mg/dL (ref 70–99)
Glucose-Capillary: 83 mg/dL (ref 70–99)
Glucose-Capillary: 128 mg/dL — ABNORMAL HIGH (ref 70–99)

## 2013-05-23 LAB — CBC WITH DIFFERENTIAL/PLATELET
Basophils Absolute: 0 10*3/uL (ref 0.0–0.1)
Basophils Relative: 0 % (ref 0–1)
Eosinophils Absolute: 0.1 10*3/uL (ref 0.0–0.7)
Eosinophils Relative: 1 % (ref 0–5)
HCT: 38.2 % — ABNORMAL LOW (ref 39.0–52.0)
MCH: 29.4 pg (ref 26.0–34.0)
MCHC: 30.4 g/dL (ref 30.0–36.0)
MCV: 97 fL (ref 78.0–100.0)
Monocytes Absolute: 0.5 10*3/uL (ref 0.1–1.0)
Monocytes Relative: 7 % (ref 3–12)
Platelets: 140 10*3/uL — ABNORMAL LOW (ref 150–400)
RDW: 18.5 % — ABNORMAL HIGH (ref 11.5–15.5)

## 2013-05-23 LAB — HEPATITIS PANEL, ACUTE
HCV Ab: NEGATIVE
Hep A IgM: NONREACTIVE
Hep B C IgM: NONREACTIVE
Hepatitis B Surface Ag: NEGATIVE

## 2013-05-23 LAB — ACETAMINOPHEN LEVEL: Acetaminophen (Tylenol), Serum: <15 ug/mL (ref 10–30)

## 2013-05-23 LAB — LACTIC ACID, PLASMA: Lactic Acid, Venous: 2 mmol/L (ref 0.5–2.2)

## 2013-05-23 LAB — PROTIME-INR
INR: 2 — ABNORMAL HIGH (ref 0.00–1.49)
Prothrombin Time: 22.1 seconds — ABNORMAL HIGH (ref 11.6–15.2)

## 2013-05-23 LAB — FERRITIN: Ferritin: >16500 ng/mL — ABNORMAL HIGH (ref 22–322)

## 2013-05-23 LAB — LACTATE DEHYDROGENASE: LDH: 556 U/L — ABNORMAL HIGH (ref 94–250)

## 2013-05-23 MED ORDER — SODIUM CHLORIDE 0.9 % IV SOLN: 100.0000 mL | INTRAVENOUS | Status: DC | PRN

## 2013-05-23 MED ORDER — TRAMADOL HCL 50 MG PO TABS
50.0000 mg | ORAL_TABLET | Freq: Once | ORAL | Status: AC
  Administered 2013-05-23: 22:00:00 50 mg via ORAL
  Filled 2013-05-23: qty 1

## 2013-05-23 MED ORDER — DEXTROSE-NACL 5-0.45 % IV SOLN
INTRAVENOUS | Status: DC
  Administered 2013-05-23 (×2): via INTRAVENOUS

## 2013-05-23 MED ORDER — NEPRO/CARBSTEADY PO LIQD
237.0000 mL | ORAL | Status: DC | PRN
  Filled 2013-05-23: qty 237

## 2013-05-23 MED ORDER — PENTAFLUOROPROP-TETRAFLUOROETH EX AERO: 1.0000 "application " | INHALATION_SPRAY | CUTANEOUS | Status: DC | PRN

## 2013-05-23 MED ORDER — LIDOCAINE HCL (PF) 1 % IJ SOLN: 5.0000 mL | INTRAMUSCULAR | Status: DC | PRN

## 2013-05-23 MED ORDER — LIDOCAINE-PRILOCAINE 2.5-2.5 % EX CREA
1.0000 "application " | TOPICAL_CREAM | CUTANEOUS | Status: DC | PRN
  Filled 2013-05-23: qty 5

## 2013-05-23 MED ORDER — ALTEPLASE 2 MG IJ SOLR
2.0000 mg | Freq: Once | INTRAMUSCULAR | Status: DC | PRN
  Filled 2013-05-23: qty 2

## 2013-05-23 MED ORDER — PHYTONADIONE 5 MG PO TABS
5.0000 mg | ORAL_TABLET | Freq: Once | ORAL | Status: AC
  Administered 2013-05-23: 13:00:00 5 mg via ORAL
  Filled 2013-05-23: qty 1

## 2013-05-23 MED ORDER — HEPARIN SODIUM (PORCINE) 1000 UNIT/ML DIALYSIS
1000.0000 [IU] | INTRAMUSCULAR | Status: DC | PRN
  Filled 2013-05-23: qty 1

## 2013-05-23 MED ORDER — HEPARIN SODIUM (PORCINE) 1000 UNIT/ML DIALYSIS
2400.0000 [IU] | Freq: Once | INTRAMUSCULAR | Status: DC
  Administered 2013-05-24: 2400 [IU] via INTRAVENOUS_CENTRAL

## 2013-05-23 NOTE — Evaluation (Signed)
Physical Therapy Evaluation Patient Details Name: Victor Lane MRN: [id number] DOB: 09-22-1945 Today's Date: 05/23/2013 Time: 6295-2841 PT Time Calculation (min): 15 min  PT Assessment / Plan / Recommendation History of Present Illness  Patient is a 67 year old male with a past medical history of ESRD on dialysis MWF, glottis carcinoma, diabetes, hypertension who presents with abdominal pain for the past week. Symptoms started gradually and progressively worsened since the onset. The pain is located in his upper abdomen and does not radiate. The pain is aching and severe. Patient reports associated nausea and decreased appetite. No aggravating/alleviating factors. No other associated symptoms such as fever, chest pain, SOB.  Clinical Impression  Pt presents with decreased balance and impaired gait and mobility.  Pt will benefit from continued PT services to address deficits and improve balance for a safe d/c home.  Pt will need 24 hour supervision at home due to impaired balance and history of falls.    PT Assessment  Patient needs continued PT services    Follow Up Recommendations  Home health PT;Supervision/Assistance - 24 hour    Does the patient have the potential to tolerate intense rehabilitation      Barriers to Discharge Decreased caregiver support pt states his wife runs errands and leaves him at home alone    Equipment Recommendations  Rolling walker with 5" wheels    Recommendations for Other Services     Frequency Min 3X/week    Precautions / Restrictions Precautions Precautions: Fall Restrictions Weight Bearing Restrictions: No   Pertinent Vitals/Pain No c/o pain      Mobility  Bed Mobility Bed Mobility: Supine to Sit;Sitting - Scoot to Edge of Bed Supine to Sit: 5: Supervision Sitting - Scoot to [place] of Bed: 5: Supervision Transfers Transfers: Sit to Stand;Stand to Sit Sit to Stand: 4: Min guard Stand to Sit: 4: Min guard Details for Transfer  Assistance: steadying assist for balance Ambulation/Gait Ambulation/Gait Assistance: 4: Min assist Ambulation Distance (Feet): 100 Feet Assistive device: None Ambulation/Gait Assistance Details: pt with wide BOS, increased lateral sway, difficulty with grading movements in A/P plane.  requires min A for several LOB. pt with delayed balance reactions    Exercises     PT Diagnosis: Difficulty walking;Abnormality of gait  PT Problem List: Decreased mobility;Decreased balance;Decreased safety awareness PT Treatment Interventions: DME instruction;Gait training;Stair training;Functional mobility training;Therapeutic activities;Patient/family education;Cognitive remediation;Neuromuscular re-education;Balance training;Therapeutic exercise;Modalities     PT Goals(Current goals can be found in the care plan section) Acute Rehab PT Goals Patient Stated Goal: go home today PT Goal Formulation: With patient Time For Goal Achievement: 05/30/13 Potential to Achieve Goals: Good  Visit Information  Last PT Received On: 05/23/13 Assistance Needed: +1 History of Present Illness: Patient is a 67 year old male with a past medical history of ESRD on dialysis MWF, glottis carcinoma, diabetes, hypertension who presents with abdominal pain for the past week. Symptoms started gradually and progressively worsened since the onset. The pain is located in his upper abdomen and does not radiate. The pain is aching and severe. Patient reports associated nausea and decreased appetite. No aggravating/alleviating factors. No other associated symptoms such as fever, chest pain, SOB.       Prior Functioning  Home Living Family/patient expects to be discharged to:: Private residence Living Arrangements: Spouse/significant other Available Help at Discharge: Family Type of Home: House Home Access: Stairs to enter Secretary/administrator of Steps: 3 Entrance Stairs-Rails: Left Home Layout: One level Home Equipment:  None Prior Function  Level of Independence: Needs assistance Gait / Transfers Assistance Needed: Pt reports he needs his wife's assistance to walk safely, reports he does walk alone but tends to grab furniture.  Pt states PT and has wife have suggested he use a RW, pt refuses to use RW despite history of multiple falls Communication Communication: No difficulties    Cognition  Cognition Arousal/Alertness: Awake/alert Behavior During Therapy: Impulsive Overall Cognitive Status: No family/caregiver present to determine baseline cognitive functioning    Extremity/Trunk Assessment Lower Extremity Assessment Lower Extremity Assessment: Overall WFL for tasks assessed Cervical / Trunk Assessment Cervical / Trunk Assessment: Normal   Balance Dynamic Standing Balance Dynamic Standing - Balance Support: During functional activity Dynamic Standing - Level of Assistance: 4: Min assist  End of Session PT - End of Session Equipment Utilized During Treatment: Gait belt Activity Tolerance: Patient tolerated treatment well Patient left: in bed;with bed alarm set;with call bell/phone within reach Nurse Communication: Mobility status  GP     Merelyn Klump 05/23/2013, 10:00 AM

## 2013-05-23 NOTE — Progress Notes (Signed)
Utilization Review Completed.Kim Oki T1/22/2014   

## 2013-05-23 NOTE — Consult Note (Signed)
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  The patient was noted to have hyperkalemia and he received a 500 ml bolus of fluid and Kayexalate.  Further evaluation revealed this his liver enzymes were in the 3000 range and his INR is at 2.0.  His wife reports that his clinical status has been declining over the past year with his diagnosis of his laryngeal cancer.  There is no report of any acetaminophen use and no evidence of hypotension.  He has a recent echocardiogram and it was negative for CHF, but he did have issues with diastolic dysfunction.  His acute viral hepatitis panel is negative for any abnormalities.  Past Medical History  Diagnosis Date  . Diabetes mellitus   . Hypertension   . GERD (gastroesophageal reflux disease)   . Fibromyalgia   . Chest pain   . Carotid artery occlusion   . Chronic kidney disease     - Stage 5- follwed by Dr Hyman Hopes  . Complication of anesthesia     agiation when he awaken  . Neuropathy   . Arthritis   . Sleep apnea     not on CPAP  . H/O vitamin D deficiency   . Hearing loss   . Hypercholesteremia   . Myalgia and myositis   . 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 S/P radiation therapy 01/24/2013-03/14/2013    Larynx/glottis / 63 Gy in 28 fractions    Past Surgical History  Procedure Laterality Date  . Cholecystectomy  1993  . Rotator cuff repair  07/06/2011    right  . Carotid endarterectomy Left   . Coolonoscopy    . Colonoscopy w/ biopsies and polypectomy      Hx: of  . Microlaryngoscopy with laser N/A 01/04/2013    Procedure: MICROLARYNGOSCOPY WITH BIOPSY/LASER;  Surgeon: Suzanna Obey, MD;  Location: [facility] OR;  Service: ENT;   Laterality: N/A;  . Av fistula placement Left 02/23/2013    Procedure: ARTERIOVENOUS (AV) FISTULA CREATION;  Surgeon: Fransisco Hertz, MD;  Location: [facility] OR;  Service: Vascular;  Laterality: Left;    Family History  Problem Relation Age of Onset  . Adopted: Yes    Social History:  reports that he quit smoking about 17 years ago. His smoking use included Cigarettes. He has a 62 pack-year smoking history. He has never used smokeless tobacco. He reports that he does not drink alcohol or use illicit drugs.  Allergies:  Allergies  Allergen Reactions  . Amoxicillin     Causes upset stomach  . Fentanyl     Confusion when patch applied    Medications:  Scheduled: . doxercalciferol  4 mcg Intravenous Q M,W,F-HD  . ferric gluconate (FERRLECIT/NULECIT) IV  125 mg Intravenous Q M,W,F-HD  . [START ON 05/24/2013] heparin  2,400 Units Dialysis Once in dialysis  . heparin  5,000 Units Subcutaneous Q8H  . metoprolol tartrate  12.5 mg Oral BID  . pantoprazole  40 mg Oral Daily  . sodium chloride  3 mL Intravenous Q12H   Continuous:   Results for orders placed during the hospital encounter of 05/22/13 (from the past 24 hour(s))  GLUCOSE, CAPILLARY     Status: Abnormal  Collection Time    05/23/13 12:16 AM      Result Value Range   Glucose-Capillary 63 (*) 70 - 99 mg/dL   Comment 1 Notify RN     Comment 2 Documented in Chart    GLUCOSE, CAPILLARY     Status: Abnormal   Collection Time    05/23/13 12:48 AM      Result Value Range   Glucose-Capillary 60 (*) 70 - 99 mg/dL   Comment 1 Notify RN     Comment 2 Documented in Chart    GLUCOSE, CAPILLARY     Status: None   Collection Time    05/23/13  1:05 AM      Result Value Range   Glucose-Capillary 88  70 - 99 mg/dL   Comment 1 Notify RN     Comment 2 Documented in Chart    GLUCOSE, CAPILLARY     Status: None   Collection Time    05/23/13  1:32 AM      Result Value Range   Glucose-Capillary 78  70 - 99 mg/dL   Comment 1 Notify RN      Comment 2 Documented in Chart    GLUCOSE, CAPILLARY     Status: None   Collection Time    05/23/13  2:07 AM      Result Value Range   Glucose-Capillary 92  70 - 99 mg/dL  GLUCOSE, CAPILLARY     Status: Abnormal   Collection Time    05/23/13  3:08 AM      Result Value Range   Glucose-Capillary 157 (*) 70 - 99 mg/dL   Comment 1 Notify RN     Comment 2 Documented in Chart    GLUCOSE, CAPILLARY     Status: Abnormal   Collection Time    05/23/13  4:02 AM      Result Value Range   Glucose-Capillary 167 (*) 70 - 99 mg/dL  COMPREHENSIVE METABOLIC PANEL     Status: Abnormal   Collection Time    05/23/13  5:43 AM      Result Value Range   Sodium 137  135 - 145 mEq/L   Potassium 3.9  3.5 - 5.1 mEq/L   Chloride 94 (*) 96 - 112 mEq/L   CO2 27  19 - 32 mEq/L   Glucose, Bld 122 (*) 70 - 99 mg/dL   BUN 34 (*) 6 - 23 mg/dL   Creatinine, Ser 4.09 (*) 0.50 - 1.35 mg/dL   Calcium 8.8  8.4 - 81.1 mg/dL   Total Protein 5.9 (*) 6.0 - 8.3 g/dL   Albumin 3.1 (*) 3.5 - 5.2 g/dL   AST 9147 (*) 0 - 37 U/L   ALT 2850 (*) 0 - 53 U/L   Alkaline Phosphatase 138 (*) 39 - 117 U/L   Total Bilirubin 0.9  0.3 - 1.2 mg/dL   GFR calc non Af Amer 11 (*) >90 mL/min   GFR calc Af Amer 13 (*) >90 mL/min  CBC WITH DIFFERENTIAL     Status: Abnormal   Collection Time    05/23/13  5:43 AM      Result Value Range   WBC 7.6  4.0 - 10.5 K/uL   RBC 3.94 (*) 4.22 - 5.81 MIL/uL   Hemoglobin 11.6 (*) 13.0 - 17.0 g/dL   HCT 82.9 (*) 56.2 - 13.0 %   MCV 97.0  78.0 - 100.0 fL   MCH 29.4  26.0 - 34.0 pg   MCHC 30.4  30.0 - 36.0 g/dL   RDW 29.5 (*) 62.1 - 30.8 %   Platelets 140 (*) 150 - 400 K/uL   Neutrophils Relative % 81 (*) 43 - 77 %   Neutro Abs 6.2  1.7 - 7.7 K/uL   Lymphocytes Relative 11 (*) 12 - 46 %   Lymphs Abs 0.9  0.7 - 4.0 K/uL   Monocytes Relative 7  3 - 12 %   Monocytes Absolute 0.5  0.1 - 1.0 K/uL   Eosinophils Relative 1  0 - 5 %   Eosinophils Absolute 0.1  0.0 - 0.7 K/uL   Basophils Relative 0  0  - 1 %   Basophils Absolute 0.0  0.0 - 0.1 K/uL  GLUCOSE, CAPILLARY     Status: Abnormal   Collection Time    05/23/13  6:25 AM      Result Value Range   Glucose-Capillary 127 (*) 70 - 99 mg/dL   Comment 1 Notify RN     Comment 2 Documented in Chart    GLUCOSE, CAPILLARY     Status: None   Collection Time    05/23/13 11:05 AM      Result Value Range   Glucose-Capillary 83  70 - 99 mg/dL  PROTIME-INR     Status: Abnormal   Collection Time    05/23/13 12:35 PM      Result Value Range   Prothrombin Time 22.1 (*) 11.6 - 15.2 seconds   INR 2.00 (*) 0.00 - 1.49  LACTATE DEHYDROGENASE     Status: Abnormal   Collection Time    05/23/13 12:35 PM      Result Value Range   LDH 556 (*) 94 - 250 U/L  LACTIC ACID, PLASMA     Status: None   Collection Time    05/23/13  2:58 PM      Result Value Range   Lactic Acid, Venous 2.0  0.5 - 2.2 mmol/L  GLUCOSE, CAPILLARY     Status: Abnormal   Collection Time    05/23/13  4:12 PM      Result Value Range   Glucose-Capillary 148 (*) 70 - 99 mg/dL     US Abdomen Complete  05/22/2013   CLINICAL DATA:  Elevated liver function tests.  EXAM: ULTRASOUND ABDOMEN COMPLETE  COMPARISON:  Renal ultrasound 05/15/2005.  FINDINGS: Gallbladder:  Status post cholecystectomy.  Common bile duct:  Diameter: Normal in caliber measuring 5.3 mm in the porta hepatis.  Liver:  Slightly nodular liver contour, suggestive of early cirrhosis. Mild diffuse increased echogenicity in the portal triads. No definite intrahepatic biliary ductal dilatation. No focal hepatic lesions. Normal hepatopetal flow in the portal vein.  IVC:  No abnormality visualized.  Pancreas:  Visualized portion unremarkable.  Spleen:  Size and appearance within normal limits.  10.1 cm in length.  Right Kidney:  Length: 5.9 cm in length. Diffusely thinned and echogenic cortex, suggesting medical renal disease. 1.3 x 1.0 x 0.7 cm anechoic lesion with increased through transmission in the interpolar region,  compatible with a small cyst. No other mass or hydronephrosis visualized.  Left Kidney:  Length: 6.6 cm length. Diffusely thinned and echogenic cortex, suggesting medical renal disease. 7.1 x 4.6 cm anechoic lesion with increased through transmission extending off the upper pole of the left kidney, compatible with a large simple cyst.  Abdominal aorta:  No aneurysm visualized.  Other findings:  Small volume ascites, most notably around the liver dome. Small right pleural effusion.  IMPRESSION: 1.  Echogenic portal triads which may suggest acute hepatitis. Clinical correlation is recommended. 2. Slightly nodular liver contour may suggest early changes of cirrhosis. 3. Trace volume of perihepatic ascites. 4. Small right pleural effusion. 5. Diffuse cortical thinning in both kidneys, which both appear echogenic, indicative of underlying medical renal disease. 6. Renal cysts bilaterally, as above.   Electronically Signed   By: Trudie Reed M.D.   On: 05/22/2013 18:25   Dg Abd Acute W/chest  05/22/2013   CLINICAL DATA:  Abdominal pain, shortness of breath, nausea, dialysis dependent  EXAM: ACUTE ABDOMEN SERIES (ABDOMEN 2 VIEW & CHEST 1 VIEW)  COMPARISON:  04/25/2013, 02/14/2013  FINDINGS: Small pleural effusions noted. Minor basilar atelectasis. Heart is enlarged. No definite CHF or focal pneumonia. No pneumothorax. Trachea is midline. Right IJ dialysis catheter tips in the mid SVC. No free air evident.  Nonobstructive bowel gas pattern. Air and stool within the colon. Prior cholecystectomy noted. Atherosclerosis of the abdominal vasculature. No abnormal osseous finding.  IMPRESSION: Small pleural effusions and basilar atelectasis  Cardiomegaly without CHF or pneumonia  Negative for obstruction or free air   Electronically Signed   By: Ruel Favors M.D.   On: 05/22/2013 13:03    ROS:  As stated above in the HPI otherwise negative.  Blood pressure 129/57, pulse 71, temperature 97.8 F (36.6 C), temperature  source Oral, resp. rate 18, height 5\' 10"  (1.778 m), weight 172 lb 12.8 oz (78.382 kg), SpO2 96.00%.    PE: Gen: NAD, Alert and Oriented HEENT:  Scissors/AT, EOMI Neck: Supple, no LAD Lungs: CTA Bilaterally CV: RRR without M/G/R ABM: Soft, NTND, +BS Ext: No C/C/E  Assessment/Plan: 1) Abnormal liver enzymes. 2) Coagulopathy. 3) ESRD.   My suspicion is that he had an episode of hypotension.  I cannot prove this point, but I suspect that this is the issue.  He does reports some muscle pain, but it is rather minor and not overt such as a myositis.  There is no evidence of any acetaminophen ingestion.  However, with that said, I think it is reasonable to check his CK level and an acetaminophen level.  His INR is elevated at 2.0, which is concerning, but his mental status is normal.  Plan: 1) Check CK and acetaminophen level. 2) Follow liver enzymes and INR. 3) Follow mental status.  If there is a decline this can harold acute liver failure.  Ameshia Pewitt D 05/23/2013, 5:42 PM

## 2013-05-23 NOTE — Plan of Care (Signed)
Problem: Phase I Progression Outcomes Goal: Hemodynamically stable Outcome: Progressing Patient admitted from hemodialysis after coming to ED for abdominal pain.  Arrived via stretcher, in no apparent distress but slightly disoriented.  Denies pain on arrival.  Tenderness noted on assessment.  Patient is placed on continuous telemetry and oriented to room and floor procedures.  Will continue to monitor.

## 2013-05-23 NOTE — Progress Notes (Signed)
Hypoglycemic Event  CBG: 05/23/13 0016hrs 63  Treatment: Given food and fluids PO  Symptoms: Asymptomatic  Follow-up CBG: Time:0045. 0105 CBG Result: 60, 88 then 78  Possible Reasons for Event: Patient has not eaten in several hours.  Comments/MD notified: Schorr NP.  Orders obtained to start D51/2NS at 42mL/hr and monitor CBG q1hr.      Zafir Schauer T  Remember to initiate Hypoglycemia Order Set & complete

## 2013-05-23 NOTE — Progress Notes (Signed)
Report given to receiving RN. Patient is stable. No verbal complaints. No signs or symptoms of distress or discomfort.

## 2013-05-23 NOTE — Progress Notes (Addendum)
[facility] PROGRESS NOTE  Assessment/Plan: *Hyperkalemia/ESRD (end stage renal disease): - S/p HD. Now K 3.9, check a lactic acid and LDH. - Appreciated renal assistance. - HD M, W,  F.  Elevated LFT's: - Hepatitis panel negative. coags abnormally high. - Denies ETOH consumption. - Abd [place] Slightly nodular liver contour may suggest early changes of cirrhosis.  Trace volume of perihepatic ascites. - coags High, give vit K. No episode of hypotension. - Check Ferritin, ANA, anti smooth muscle, serum copper. and alpha 1 antitrypsin deficiency. - consult gastroenterology. - stopped ranitidine, esomeprazole and venlafaxine.   HTN (hypertension) - cont metoprolol.  Diabetes mellitus - good controlled. - On no insulin.   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 - has not started chemo.    Code Status: full  Family Communication: wife at bedside  Disposition Plan: home    Consultants:  Renal; gastroenterology  Procedures:  HD  Antibiotics:  noen   HPI/Subjective: Feels better, wants to go home. Abdominal pain resolved.  Objective: Filed Vitals:   05/22/13 2250 05/22/13 2327 05/23/13 0619 05/23/13 0955  BP: 151/75 135/56 121/62 135/57  Pulse: 86 85 81 80  Temp: 97.8 F (36.6 C) 97.5 F (36.4 C) 97.6 F (36.4 C)   TempSrc: Oral Oral Oral   Resp: 18 18 17    Height:  5\' 10"  (1.778 m)    Weight:  78.245 kg (172 lb 8 oz) 78.382 kg (172 lb 12.8 oz)   SpO2: 90% 98% 96%     Intake/Output Summary (Last 24 hours) at 05/23/13 1132 Last data filed at 05/23/13 0826  Gross per 24 hour  Intake 1083.75 ml  Output   2359 ml  Net -1275.25 ml   Filed Weights   05/22/13 1150 05/22/13 2327 05/23/13 0619  Weight: 81.647 kg (180 lb) 78.245 kg (172 lb 8 oz) 78.382 kg (172 lb 12.8 oz)    Exam:  General: Alert, awake, oriented x3, in no acute distress.  HEENT: No bruits, no goiter.  Heart: Regular rate and rhythm, without murmurs, rubs, gallops.  Lungs:  Good air movement, bilateral air movement.  Abdomen: Soft, nontender, nondistended, positive bowel sounds.    Data Reviewed: Basic Metabolic Panel:  Recent Labs Lab 05/22/13 1201 05/23/13 0543  NA 132* 137  K 6.3* 3.9  CL 84* 94*  CO2 19 27  GLUCOSE 69* 122*  BUN 69* 34*  CREATININE 8.42* 4.98*  CALCIUM 9.4 8.8   Liver Function Tests:  Recent Labs Lab 05/22/13 1201 05/23/13 0543  AST 3592* 2701*  ALT 2934* 2850*  ALKPHOS 145* 138*  BILITOT 1.0 0.9  PROT 6.7 5.9*  ALBUMIN 3.5 3.1*    Recent Labs Lab 05/22/13 1201  LIPASE 27   No results found for this basename: AMMONIA,  in the last 168 hours CBC:  Recent Labs Lab 05/22/13 1201 05/23/13 0543  WBC 13.6* 7.6  NEUTROABS 10.9* 6.2  HGB 12.9* 11.6*  HCT 41.9 38.2*  MCV 97.4 97.0  PLT 200 140*   Cardiac Enzymes: No results found for this basename: CKTOTAL, CKMB, CKMBINDEX, TROPONINI,  in the last 168 hours BNP (last 3 results) No results found for this basename: PROBNP,  in the last 8760 hours CBG:  Recent Labs Lab 05/23/13 0207 05/23/13 0308 05/23/13 0402 05/23/13 0625 05/23/13 1105  GLUCAP 92 157* 167* 127* 83    No results found for this or any previous visit (from the past 240 hour(s)).   Studies: US Abdomen Complete  05/22/2013  CLINICAL DATA:  Elevated liver function tests.  EXAM: ULTRASOUND ABDOMEN COMPLETE  COMPARISON:  Renal ultrasound 05/15/2005.  FINDINGS: Gallbladder:  Status post cholecystectomy.  Common bile duct:  Diameter: Normal in caliber measuring 5.3 mm in the porta hepatis.  Liver:  Slightly nodular liver contour, suggestive of early cirrhosis. Mild diffuse increased echogenicity in the portal triads. No definite intrahepatic biliary ductal dilatation. No focal hepatic lesions. Normal hepatopetal flow in the portal vein.  IVC:  No abnormality visualized.  Pancreas:  Visualized portion unremarkable.  Spleen:  Size and appearance within normal limits.  10.1 cm in length.  Right  Kidney:  Length: 5.9 cm in length. Diffusely thinned and echogenic cortex, suggesting medical renal disease. 1.3 x 1.0 x 0.7 cm anechoic lesion with increased through transmission in the interpolar region, compatible with a small cyst. No other mass or hydronephrosis visualized.  Left Kidney:  Length: 6.6 cm length. Diffusely thinned and echogenic cortex, suggesting medical renal disease. 7.1 x 4.6 cm anechoic lesion with increased through transmission extending off the upper pole of the left kidney, compatible with a large simple cyst.  Abdominal aorta:  No aneurysm visualized.  Other findings:  Small volume ascites, most notably around the liver dome. Small right pleural effusion.  IMPRESSION: 1. Echogenic portal triads which may suggest acute hepatitis. Clinical correlation is recommended. 2. Slightly nodular liver contour may suggest early changes of cirrhosis. 3. Trace volume of perihepatic ascites. 4. Small right pleural effusion. 5. Diffuse cortical thinning in both kidneys, which both appear echogenic, indicative of underlying medical renal disease. 6. Renal cysts bilaterally, as above.   Electronically Signed   By: Trudie Reed M.D.   On: 05/22/2013 18:25   Dg Abd Acute W/chest  05/22/2013   CLINICAL DATA:  Abdominal pain, shortness of breath, nausea, dialysis dependent  EXAM: ACUTE ABDOMEN SERIES (ABDOMEN 2 VIEW & CHEST 1 VIEW)  COMPARISON:  04/25/2013, 02/14/2013  FINDINGS: Small pleural effusions noted. Minor basilar atelectasis. Heart is enlarged. No definite CHF or focal pneumonia. No pneumothorax. Trachea is midline. Right IJ dialysis catheter tips in the mid SVC. No free air evident.  Nonobstructive bowel gas pattern. Air and stool within the colon. Prior cholecystectomy noted. Atherosclerosis of the abdominal vasculature. No abnormal osseous finding.  IMPRESSION: Small pleural effusions and basilar atelectasis  Cardiomegaly without CHF or pneumonia  Negative for obstruction or free air    Electronically Signed   By: Ruel Favors M.D.   On: 05/22/2013 13:03    Scheduled Meds: . doxercalciferol  4 mcg Intravenous Q M,W,F-HD  . ferric gluconate (FERRLECIT/NULECIT) IV  125 mg Intravenous Q M,W,F-HD  . [START ON 05/24/2013] heparin  2,400 Units Dialysis Once in dialysis  . heparin  5,000 Units Subcutaneous Q8H  . metoprolol tartrate  12.5 mg Oral BID  . pantoprazole  40 mg Oral Daily  . sodium chloride  3 mL Intravenous Q12H  . venlafaxine XR  37.5 mg Oral Q breakfast   Continuous Infusions:    Marinda Elk  [facility] Pager [phone]. If 8PM-8AM, please contact night-coverage at [email or web address], password [facility] 05/23/2013, 11:32 AM  LOS: 1 day

## 2013-05-23 NOTE — Progress Notes (Signed)
[facility]  Patient Status: Active (receiving services up to time of hospitalization)  [facility] is providing the following services: RN and PT  If patient discharges after hours, please call [phone].   Victor Lane 05/23/2013, 11:32 AM

## 2013-05-23 NOTE — Progress Notes (Signed)
  [facility] Progress Note    Subjective: No complaints   Exam  Blood pressure 135/57, pulse 80, temperature 97.6 F (36.4 C), temperature source Oral, resp. rate 17, height 5\' 10"  (1.778 m), weight 78.382 kg (172 lb 12.8 oz), SpO2 96.00%. General: alert, cooperative and no distress  Neck: no jvd Resp: clear bilaterally  Cardio: regular, no M or rub GI: + BS, nontender, nd  Ext: no edema  Neurologic: Grossly normal  Access: R IJ catheter, AVF @ LUA with + bruit   Dialysis Orders: MWF @ [facility]  78 kg 4 hrs 400/A1.5 2K/2.25Ca Heparin 2400 U R IJ catheter, maturing AVF @ LUA (placed 9/4 by Dr. Imogene Burn)  Hectorol 4 mcg Epogen 0 Venofer 100 mg x 10 (through 12/15)   Assessment/Plan:  1. Abdominal pain / ^LFT's: not related to dialysis or cause of ESRD (which was DM). Hep B and C negative, US unremarkable, tbili normal. Is on medications which can cause ^LFT's (ranitidine, esomeprazole, venlafaxine), perhaps some of these could be stopped temporarily. Has been on zantac 6 mos, esomeprazole 3 mos (switched from prilosec which he was on for years) and venlafaxine 1-2 years 2. Hyperkalemia - resolved 3. ESRD - HD tomorrow 4. Hypertension/volume - BP 132/64 on Metoprolol , at dry wt 5. Anemia - Hgb 12.9, on Venofer series for T-sat 24%. 6. Metabolic bone disease - Ca 9.4 (9.8 corrected), last P 7, last iPTH up to 1042, although Hectorol was increased to 4 mcg; Phoslo 2 with meals. 7. Nutrition - Alb 3.5, renal vitamin. 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     Vinson Moselle MD  pager [phone]    cell [phone]  05/23/2013, 12:02 PM   Recent Labs Lab 05/22/13 1201 05/23/13 0543  NA 132* 137  K 6.3* 3.9  CL 84* 94*  CO2 19 27  GLUCOSE 69* 122*  BUN 69* 34*  CREATININE 8.42* 4.98*  CALCIUM 9.4 8.8    Recent Labs Lab 05/22/13 1201 05/23/13 0543  AST 3592* 2701*  ALT 2934* 2850*  ALKPHOS 145* 138*  BILITOT 1.0 0.9  PROT 6.7 5.9*  ALBUMIN 3.5 3.1*     Recent Labs Lab 05/22/13 1201 05/23/13 0543  WBC 13.6* 7.6  NEUTROABS 10.9* 6.2  HGB 12.9* 11.6*  HCT 41.9 38.2*  MCV 97.4 97.0  PLT 200 140*   . doxercalciferol  4 mcg Intravenous Q M,W,F-HD  . ferric gluconate (FERRLECIT/NULECIT) IV  125 mg Intravenous Q M,W,F-HD  . [START ON 05/24/2013] heparin  2,400 Units Dialysis Once in dialysis  . heparin  5,000 Units Subcutaneous Q8H  . metoprolol tartrate  12.5 mg Oral BID  . pantoprazole  40 mg Oral Daily  . phytonadione  5 mg Oral Once  . sodium chloride  3 mL Intravenous Q12H  . venlafaxine XR  37.5 mg Oral Q breakfast     [START ON 05/24/2013] sodium chloride, [START ON 05/24/2013] sodium chloride, albuterol, [START ON 05/24/2013] alteplase, bisacodyl, [START ON 05/24/2013] feeding supplement (NEPRO CARB STEADY), [START ON 05/24/2013] heparin, [START ON 05/24/2013] lidocaine (PF), [START ON 05/24/2013] lidocaine-prilocaine, morphine injection, ondansetron (ZOFRAN) IV, ondansetron, [START ON 05/24/2013] pentafluoroprop-tetrafluoroeth, sorbitol

## 2013-05-24 ENCOUNTER — Encounter: Payer: Self-pay | Admitting: *Deleted

## 2013-05-24 DIAGNOSIS — E213 Hyperparathyroidism, unspecified: Secondary | ICD-10-CM

## 2013-05-24 DIAGNOSIS — E43 Unspecified severe protein-calorie malnutrition: Secondary | ICD-10-CM

## 2013-05-24 LAB — RENAL FUNCTION PANEL
Albumin: 3.2 g/dL — ABNORMAL LOW (ref 3.5–5.2)
Chloride: 91 mEq/L — ABNORMAL LOW (ref 96–112)
GFR calc Af Amer: 9 mL/min — ABNORMAL LOW (ref >90)
GFR calc non Af Amer: 8 mL/min — ABNORMAL LOW (ref >90)
Potassium: 4.9 mEq/L (ref 3.5–5.1)
Sodium: 133 mEq/L — ABNORMAL LOW (ref 135–145)

## 2013-05-24 LAB — URINE MICROSCOPIC-ADD ON

## 2013-05-24 LAB — HEPATIC FUNCTION PANEL
ALT: 1939 U/L — ABNORMAL HIGH (ref 0–53)
Albumin: 3.3 g/dL — ABNORMAL LOW (ref 3.5–5.2)
Total Bilirubin: 0.8 mg/dL (ref 0.3–1.2)
Total Protein: 6.7 g/dL (ref 6.0–8.3)

## 2013-05-24 LAB — GLUCOSE, CAPILLARY: Glucose-Capillary: 109 mg/dL — ABNORMAL HIGH (ref 70–99)

## 2013-05-24 LAB — URINALYSIS, ROUTINE W REFLEX MICROSCOPIC
Glucose, UA: NEGATIVE mg/dL
Ketones, ur: NEGATIVE mg/dL
Nitrite: NEGATIVE
Protein, ur: >300 mg/dL — AB
Specific Gravity, Urine: 1.017 (ref 1.005–1.030)

## 2013-05-24 LAB — ANA: Anti Nuclear Antibody(ANA): NEGATIVE

## 2013-05-24 LAB — ANTI-SMOOTH MUSCLE ANTIBODY, IGG: F-Actin IgG: 11 U (ref <20)

## 2013-05-24 MED ORDER — HEPARIN SODIUM (PORCINE) 1000 UNIT/ML DIALYSIS: 2400.0000 [IU] | Freq: Once | INTRAMUSCULAR | Status: DC

## 2013-05-24 MED ORDER — NEPRO/CARBSTEADY PO LIQD: 237.0000 mL | Freq: Two times a day (BID) | ORAL | Status: DC

## 2013-05-24 MED ORDER — SODIUM CHLORIDE 0.9 % IV SOLN: 100.0000 mL | INTRAVENOUS | Status: DC | PRN

## 2013-05-24 MED ORDER — LIDOCAINE HCL (PF) 1 % IJ SOLN: 5.0000 mL | INTRAMUSCULAR | Status: DC | PRN

## 2013-05-24 MED ORDER — ALTEPLASE 2 MG IJ SOLR
2.0000 mg | Freq: Once | INTRAMUSCULAR | Status: DC | PRN
  Filled 2013-05-24: qty 2

## 2013-05-24 MED ORDER — NEPRO/CARBSTEADY PO LIQD
237.0000 mL | ORAL | Status: DC | PRN
  Filled 2013-05-24: qty 237

## 2013-05-24 MED ORDER — NEPRO/CARBSTEADY PO LIQD
237.0000 mL | Freq: Two times a day (BID) | ORAL | Status: DC
  Filled 2013-05-24 (×4): qty 237

## 2013-05-24 MED ORDER — DOXERCALCIFEROL 4 MCG/2ML IV SOLN
INTRAVENOUS | Status: AC
  Filled 2013-05-24: qty 2

## 2013-05-24 MED ORDER — HEPARIN SODIUM (PORCINE) 1000 UNIT/ML DIALYSIS: 1000.0000 [IU] | INTRAMUSCULAR | Status: DC | PRN

## 2013-05-24 MED ORDER — LIDOCAINE-PRILOCAINE 2.5-2.5 % EX CREA: 1.0000 "application " | TOPICAL_CREAM | CUTANEOUS | Status: DC | PRN

## 2013-05-24 MED ORDER — PENTAFLUOROPROP-TETRAFLUOROETH EX AERO: 1.0000 "application " | INHALATION_SPRAY | CUTANEOUS | Status: DC | PRN

## 2013-05-24 NOTE — Discharge Summary (Signed)
Physician Discharge Summary  Mauri Tolen Milana Huntsman BMW:[id number] DOB: 1945-12-09 DOA: 05/22/2013  PCP: Lenora Boys, MD  Admit date: 05/22/2013 Discharge date: 05/24/2013  Time spent: >30 minutes  Recommendations for Outpatient Follow-up:  1. CMET to follow electrolytes, renal function and LFT's 2. Follow results of serum copper levels 3. -patient to follow with GI (Dr. Elnoria Howard) for further evaluation and treatment 4. Reassess BP and adjust medications as needed (avoid hypotension, especially during HD)  Discharge Diagnoses:  Hyperkalemia Glottis carcinoma Diabetes mellitus HTN (hypertension) ESRD (end stage renal disease) Neuropathy Transaminitis   Discharge Condition: stable and improved. Will follow with PCP in 1 week. GI will schedule follow up as well. Continue HD treatments as an outpatient.  Diet recommendation: low sodium renal diet  Filed Weights   05/24/13 0640 05/24/13 0820 05/24/13 1225  Weight: 81.557 kg (179 lb 12.8 oz) 80.6 kg (177 lb 11.1 oz) 78 kg (171 lb 15.3 oz)    History of present illness:  67 y.o. male with multiple medical problems including ESRD, SSCA of vocal cords s/p XRT, HTN, depression, neuropathy, DM, recent prolonged hospitalization for respiratory failure, presents with several day h/o nausea, abdominal pain, constipation. No f/c. Has fallen multiple times in the past few weeks. No dyspnea or swelling. HD q MWF. In ED, potassium 6.3 and transaminases around 3000. Bilirubin normal. Alk phos 145. No h/o heavy alcohol or tylenol use, no medication changes or hx of viral hepatitis. No jaundice or bleeding. S/p cholecystectomy. Pt received kayexalate in ED. Nephrology consulted and planning to performed HD. EDP has ordered RUQ [place] which is pending.   Hospital Course:  1-Hyperhalemia: in patient with ESRD -admitted and HD performed -K level at discharge WNL  2-transaminitis: suspected to be due to shock liver (with hypotensive episodes during HD and/or  recent hospitalization with VDRF) -hepatitis panel neg -CK and acetaminophen level WNL -normal ANA, Anti-smooth muscle antibody -slight elevation of alpha-1-antitrypsin (per GI, not high enough to explained the cause of his elevated LFT's) -patient LFT's trending down properly and he has remained asymptomatic -copper serum level pending -Dr. Elnoria Howard will follow in outpatient setting -LFT's to be repeated in 1 week  3-DM: continue diet control and close follow with PCP.  4-HTN: continue current medication and low sodium diet. -frequent checkups of VS, especially around HD treatment to prevent hypotension  5-depression: continue effexor  6-Glottis carcinoma : will continue outpatient follow up with oncology. - has only received radiation therapy.  - has not started chemo.  7-ESRD: continue M_W_F HD treatment per renal service. Patient planning if possible to switch to peritoneal dialysis    8-GERD: continue nexium and ranitidine  9-hyperparathyroidism: continue phoslo    Procedures:  See below for x-ray reports  HD during inpatient stay  Consultations:  GI  Renal service  Discharge Exam: Filed Vitals:   05/24/13 1320  BP: 135/52  Pulse: 74  Temp: 98.1 F (36.7 C)  Resp: 18    General: no icterus, afebrile, no complaints Cardiovascular: S1 and S2, no rubs or gallops Respiratory: CTA bilaterally Abdomen: soft, NT, ND, positive BS Neuro: intact, no focal deficit  Discharge Instructions  Discharge Orders   Future Appointments Provider Department Dept Phone   06/29/2013 11:00 AM [facility] [phone]   06/29/2013 12:00 PM Nyoka Cowden, MD [facility] [phone]   07/25/2013 3:40 PM Lonie Peak, MD [facility] [phone]   Future Orders Complete By Expires   Discharge  instructions  As directed    Comments:     Arrange follow up with PCP in 1 week Take medications as  prescribed Keep yourself well hydrated Follow a low sodium diet       Medication List         aspirin 81 MG tablet  Take 81 mg by mouth daily.     calcium acetate 667 MG capsule  Commonly known as:  PHOSLO  Take 1,334 mg by mouth 3 (three) times daily with meals.     esomeprazole 40 MG capsule  Commonly known as:  NEXIUM  Take 40 mg by mouth daily before breakfast.     feeding supplement (NEPRO CARB STEADY) Liqd  Take 237 mLs by mouth 2 (two) times daily between meals.     metoprolol tartrate 25 MG tablet  Commonly known as:  LOPRESSOR  Take 0.5 tablets (12.5 mg total) by mouth 2 (two) times daily.     multivitamin Tabs tablet  Take 1 tablet by mouth at bedtime.     ranitidine 300 MG capsule  Commonly known as:  ZANTAC  Take 300 mg by mouth at bedtime.     traMADol 50 MG tablet  Commonly known as:  ULTRAM  Take 50 mg by mouth every 4 (four) hours as needed for moderate pain.     Venlafaxine HCl 37.5 MG Tb24  Take 1 tablet by mouth daily.       Allergies  Allergen Reactions  . Amoxicillin     Causes upset stomach  . Fentanyl     Confusion when patch applied       Follow-up Information   Follow up with FRIED, ROBERT L, MD. Schedule an appointment as soon as possible for a visit in 1 week.   Specialty:  Family Medicine   Contact information:   [address] [place] [address] [phone]       The results of significant diagnostics from this hospitalization (including imaging, microbiology, ancillary and laboratory) are listed below for reference.    Significant Diagnostic Studies: Dg Chest 2 View  04/25/2013   CLINICAL DATA:  Shortness of breath, renal failure, former smoking history  EXAM: CHEST  2 VIEW  COMPARISON:  X-ray of 02/27/2013  FINDINGS: There are small pleural effusions present left greater than right with mild left basilar atelectasis. Also there is mild cardiomegaly with mild to moderate pulmonary vascular congestion present consistent  with mild CHF. The tips of the central venous catheter from the right extend into the mid to lower SVC.  IMPRESSION: Mild CHF with small pleural effusions.   Electronically Signed   By: Dwyane Dee M.D.   On: 04/25/2013 11:07   Mr Lumbar Spine Wo Contrast  04/28/2013   [facility]  NEUROIMAGING REPORT   STUDY DATE: 04/27/13 PATIENT NAME: KOAL ESLINGER DOB: Apr 18, 1946 MRN: [id number]  ORDERING CLINICIAN: Joycelyn Schmid, MD  CLINICAL HISTORY: 67 year old male with lower extremity pain and weakness.  EXAM: MRI lumbar spine (without)  TECHNIQUE: MRI of the lumbar spine was obtained utilizing 4 mm sagittal  slices from T11-12 down to the lower sacrum with T1, T2 and inversion  recovery views. In addition 4 mm axial slices from L1-2 down to L5-S1  level were included with T1 and T2 weighted views. CONTRAST: no IMAGING SITE: [employer] (3 Tesla MRI)   FINDINGS:  Significant motion artifact noted.  On sagittal views the vertebral bodies have normal height and  alignment.  Hemangiomas at T12 and L4. Diffuse decreased marrow signal in the  vertebral bodies on T1 and T2 views, may reflect hematopoietically active  marrow in the setting of chronic medical illness such as end-stage renal  disease and chronic anemia. Disc bulging spondylosis at T11-12, L1-2,  L2-3, L3-4, L4-5. The conus medullaris terminates at the level of L1.    On axial views:  L1-2: disc bulging with no spinal stenosis or foraminal narrowing  L2-3: disc bulging with moderate-severe spinal stenosis and mild  biforaminal foraminal stenosis   L3-4: disc bulging with mild-moderate biforaminal foraminal stenosis   L4-5: right postero-lateral disc bulging and facet hypertrophy with  moderate-to severe spinal stenosis and moderate biforaminal foraminal  stenosis  L5-S1: no spinal stenosis or foraminal narrowing   Limited views of the aorta, kidneys, iliopsoas muscles and sacroiliac  joints are unremarkable.    04/28/2013    Abnormal MRI lumbar spine (without) demonstrating: 1. At L2-3: disc bulging with moderate-severe spinal stenosis and mild  biforaminal foraminal stenosis   2. At L4-5: right postero-lateral disc bulging and facet hypertrophy with  moderate-to severe spinal stenosis and moderate biforaminal foraminal  stenosis  3. At L3-4: disc bulging with mild-moderate biforaminal foraminal stenosis 4. Diffuse decreased marrow signal in the vertebral bodies, may reflect  hematopoietically active marrow related to chronic medical illness (such  as patient's known end-stage renal disease and chronic anemia)   INTERPRETING PHYSICIAN:  Suanne Marker, MD Certified in Neurology, Neurophysiology and Neuroimaging  [facility] [address] [place], [place] [address] [phone]   US Abdomen Complete  05/22/2013   CLINICAL DATA:  Elevated liver function tests.  EXAM: ULTRASOUND ABDOMEN COMPLETE  COMPARISON:  Renal ultrasound 05/15/2005.  FINDINGS: Gallbladder:  Status post cholecystectomy.  Common bile duct:  Diameter: Normal in caliber measuring 5.3 mm in the porta hepatis.  Liver:  Slightly nodular liver contour, suggestive of early cirrhosis. Mild diffuse increased echogenicity in the portal triads. No definite intrahepatic biliary ductal dilatation. No focal hepatic lesions. Normal hepatopetal flow in the portal vein.  IVC:  No abnormality visualized.  Pancreas:  Visualized portion unremarkable.  Spleen:  Size and appearance within normal limits.  10.1 cm in length.  Right Kidney:  Length: 5.9 cm in length. Diffusely thinned and echogenic cortex, suggesting medical renal disease. 1.3 x 1.0 x 0.7 cm anechoic lesion with increased through transmission in the interpolar region, compatible with a small cyst. No other mass or hydronephrosis visualized.  Left Kidney:  Length: 6.6 cm length. Diffusely thinned and echogenic cortex, suggesting medical renal disease. 7.1 x 4.6 cm anechoic lesion with  increased through transmission extending off the upper pole of the left kidney, compatible with a large simple cyst.  Abdominal aorta:  No aneurysm visualized.  Other findings:  Small volume ascites, most notably around the liver dome. Small right pleural effusion.  IMPRESSION: 1. Echogenic portal triads which may suggest acute hepatitis. Clinical correlation is recommended. 2. Slightly nodular liver contour may suggest early changes of cirrhosis. 3. Trace volume of perihepatic ascites. 4. Small right pleural effusion. 5. Diffuse cortical thinning in both kidneys, which both appear echogenic, indicative of underlying medical renal disease. 6. Renal cysts bilaterally, as above.   Electronically Signed   By: Trudie Reed M.D.   On: 05/22/2013 18:25   Dg Abd Acute W/chest  05/22/2013   CLINICAL DATA:  Abdominal pain, shortness of breath, nausea, dialysis dependent  EXAM: ACUTE ABDOMEN SERIES (ABDOMEN 2  VIEW & CHEST 1 VIEW)  COMPARISON:  04/25/2013, 02/14/2013  FINDINGS: Small pleural effusions noted. Minor basilar atelectasis. Heart is enlarged. No definite CHF or focal pneumonia. No pneumothorax. Trachea is midline. Right IJ dialysis catheter tips in the mid SVC. No free air evident.  Nonobstructive bowel gas pattern. Air and stool within the colon. Prior cholecystectomy noted. Atherosclerosis of the abdominal vasculature. No abnormal osseous finding.  IMPRESSION: Small pleural effusions and basilar atelectasis  Cardiomegaly without CHF or pneumonia  Negative for obstruction or free air   Electronically Signed   By: Ruel Favors M.D.   On: 05/22/2013 13:03   Labs: Basic Metabolic Panel:  Recent Labs Lab 05/22/13 1201 05/23/13 0543 05/24/13 0700  NA 132* 137 133*  K 6.3* 3.9 4.9  CL 84* 94* 91*  CO2 19 27 24   GLUCOSE 69* 122* 106*  BUN 69* 34* 51*  CREATININE 8.42* 4.98* 6.66*  CALCIUM 9.4 8.8 9.0  PHOS  --   --  5.8*   Liver Function Tests:  Recent Labs Lab 05/22/13 1201 05/23/13 0543  05/24/13 0700  AST 3592* 2701* 817*  ALT 2934* 2850* 1939*  ALKPHOS 145* 138* 147*  BILITOT 1.0 0.9 0.8  PROT 6.7 5.9* 6.7  ALBUMIN 3.5 3.1* 3.2*  3.3*    Recent Labs Lab 05/22/13 1201  LIPASE 27   CBC:  Recent Labs Lab 05/22/13 1201 05/23/13 0543  WBC 13.6* 7.6  NEUTROABS 10.9* 6.2  HGB 12.9* 11.6*  HCT 41.9 38.2*  MCV 97.4 97.0  PLT 200 140*   Cardiac Enzymes:  Recent Labs Lab 05/23/13 1905  CKTOTAL 154   CBG:  Recent Labs Lab 05/23/13 0625 05/23/13 1105 05/23/13 1612 05/23/13 2115 05/24/13 1310  GLUCAP 127* 83 148* 128* 109*    Signed:  Danaria Larsen  [facility] 05/24/2013, 3:38 PM

## 2013-05-24 NOTE — Progress Notes (Signed)
Pt. Was taken upstairs for Dialysis around 8:00 a.m. And returned around 1:00 pm. Pt. Didn't exhibit any signs or symptoms of distress. U/A sent down to lab as ordered no results were yet available. Pt. D/C instructions reviewed with pt. Wife, and son. Pt. And family verbalized understanding of discharge instructions. D/C Tele, D/C IV, Pt. D/C from Unit via Wheelchair and wife and son transported pt. From Hospital.

## 2013-05-24 NOTE — Progress Notes (Signed)
Visited with patient at [facility] during dialysis treatment to provide support and care continuity.  He indicated he was doing well in context of s/p radiation treatments but still having issues with his kidneys.  I provided encouragement.  Will continue to navigate as L3 (treatments completed) patient.  Young Berry, RN, BSN, [facility] Navigator [phone]

## 2013-05-24 NOTE — Progress Notes (Signed)
INITIAL NUTRITION ASSESSMENT  DOCUMENTATION CODES Per approved criteria  -Severe malnutrition in the context of chronic illness   INTERVENTION: Add Nepro Shake po BID, each supplement provides 425 kcal and 19 grams protein. RD to continue to follow nutrition care plan.  NUTRITION DIAGNOSIS: Unintentional wt loss related to catabolic illness as evidenced by 12% wt loss x 5 months.   Goal: Intake to meet >90% of estimated nutrition needs.  Monitor:  weight trends, lab trends, I/O's, PO intake, supplement tolerance  Reason for Assessment: Poor PO Intake  67 y.o. male  Admitting Dx: Hyperkalemia  ASSESSMENT: PMHx significant for ESRD MWF, SSCA of vocal cords s/p radiation in 8-02/2013, HTN, depression, neuropathy, DM, recent prolonged hospitalization for respiratory failure. Admitted with nausea, abdominal pain and constipation x several days. Multiple falls in the past few weeks. Work-up ongoing.  On admission, pt reported last BM was at least 1 week PTA. Pt reports that he has had a BM since admission. Potassium was elevated at 6.3 on admission and pt received kayexalate. Potassium is now 4.9.  Hep B and C negative. GI evaluated pt on 12/2 and suspects pt's issues with LFT's is 2/2 hypotension.  Pt S/p PEG placement on 9/2, however per pt report, he no longer has PEG and it was removed in October.  Pt confirms that he is now eating well and denies any nutrition concerns. Pt has had significant wt loss of 12% x 5 months and suspected intake of <75% x at least 1 month. Pt meets criteria for severe MALNUTRITION in the context of chronic illness as evidenced by the above information.  Potassium WNL and Phosphorus is elevated at 5.8.  Height: Ht Readings from Last 1 Encounters:  05/22/13 5\' 10"  (1.778 m)    Weight: Wt Readings from Last 1 Encounters:  05/24/13 171 lb 15.3 oz (78 kg)   Dry weight (per renal): 78 kg  Ideal Body Weight: 75.5 kg  % Ideal Body Weight: 103%  Wt  Readings from Last 25 Encounters:  05/24/13 171 lb 15.3 oz (78 kg)  05/09/13 175 lb (79.379 kg)  04/25/13 178 lb (80.74 kg)  04/19/13 190 lb (86.183 kg)  03/31/13 180 lb 9.6 oz (81.92 kg)  03/28/13 179 lb 12.8 oz (81.557 kg)  03/13/13 181 lb (82.101 kg)  03/06/13 180 lb 12.8 oz (82.01 kg)  02/25/13 179 lb 14.4 oz (81.602 kg)  02/25/13 179 lb 14.4 oz (81.602 kg)  02/09/13 190 lb 4.8 oz (86.32 kg)  02/06/13 196 lb 14.4 oz (89.313 kg)  01/30/13 199 lb (90.266 kg)  01/04/13 196 lb 5 oz (89.047 kg)  01/04/13 196 lb 5 oz (89.047 kg)  10/20/11 207 lb (93.895 kg)  04/21/11 210 lb (95.255 kg)  03/24/11 210 lb 8 oz (95.482 kg)     Usual Body Weight: 195 lb  % Usual Body Weight: 88%  BMI:  Body mass index is 24.67 kg/(m^2). Normal weight  Estimated Nutritional Needs: Kcal: 2000 - 2200 Protein: at least 100 g protein daily Fluid: 1.2 liter fluid daily  Skin: intact  Diet Order: Renal 80-90  EDUCATION NEEDS: -No education needs identified at this time   Intake/Output Summary (Last 24 hours) at 05/24/13 1251 Last data filed at 05/24/13 1225  Gross per 24 hour  Intake    720 ml  Output   2775 ml  Net  -2055 ml    Last BM: 12/2   Labs:   Recent Labs Lab 05/22/13 1201 05/23/13 0543 05/24/13 0700  NA 132* 137 133*  K 6.3* 3.9 4.9  CL 84* 94* 91*  CO2 19 27 24   BUN 69* 34* 51*  CREATININE 8.42* 4.98* 6.66*  CALCIUM 9.4 8.8 9.0  PHOS  --   --  5.8*  GLUCOSE 69* 122* 106*    CBG (last 3)   Recent Labs  05/23/13 1105 05/23/13 1612 05/23/13 2115  GLUCAP 83 148* 128*    Scheduled Meds: . doxercalciferol  4 mcg Intravenous Q M,W,F-HD  . ferric gluconate (FERRLECIT/NULECIT) IV  125 mg Intravenous Q M,W,F-HD  . [START ON 05/25/2013] heparin  2,400 Units Dialysis Once in dialysis  . heparin  5,000 Units Subcutaneous Q8H  . metoprolol tartrate  12.5 mg Oral BID  . pantoprazole  40 mg Oral Daily  . sodium chloride  3 mL Intravenous Q12H    Continuous  Infusions:   Past Medical History  Diagnosis Date  . Diabetes mellitus   . Hypertension   . GERD (gastroesophageal reflux disease)   . Fibromyalgia   . Chest pain   . Carotid artery occlusion   . Chronic kidney disease     - Stage 5- follwed by Dr Hyman Hopes  . Complication of anesthesia     agiation when he awaken  . Neuropathy   . Arthritis   . Sleep apnea     not on CPAP  . H/O vitamin D deficiency   . Hearing loss   . Hypercholesteremia   . Myalgia and myositis   . 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 S/P radiation therapy 01/24/2013-03/14/2013    Larynx/glottis / 63 Gy in 28 fractions    Past Surgical History  Procedure Laterality Date  . Cholecystectomy  1993  . Rotator cuff repair  07/06/2011    right  . Carotid endarterectomy Left   . Coolonoscopy    . Colonoscopy w/ biopsies and polypectomy      Hx: of  . Microlaryngoscopy with laser N/A 01/04/2013    Procedure: MICROLARYNGOSCOPY WITH BIOPSY/LASER;  Surgeon: Suzanna Obey, MD;  Location: [facility] OR;  Service: ENT;  Laterality: N/A;  . Av fistula placement Left 02/23/2013    Procedure: ARTERIOVENOUS (AV) FISTULA CREATION;  Surgeon: Fransisco Hertz, MD;  Location: [facility] OR;  Service: Vascular;  Laterality: Left;    Jarold Motto MS, RD, LDN Pager: [phone] After-hours pager: [phone]

## 2013-05-24 NOTE — Procedures (Signed)
I was present at this dialysis session, have reviewed the session itself and made  appropriate changes   Vinson Moselle MD  pager [phone]    cell [phone]  05/24/2013, 12:02 PM

## 2013-05-24 NOTE — Progress Notes (Signed)
Subjective:  Seen on dialysis, comfortable, no complaints  Objective: Vital signs in last 24 hours: Temp:  [97.1 F (36.2 C)-98.2 F (36.8 C)] 97.8 F (36.6 C) (12/03 0820) Pulse Rate:  [71-80] 71 (12/03 0900) Resp:  [18-19] 19 (12/03 0820) BP: (122-138)/(53-79) 130/79 mmHg (12/03 0900) SpO2:  [92 %-100 %] 100 % (12/03 0820) Weight:  [80.6 kg (177 lb 11.1 oz)-81.557 kg (179 lb 12.8 oz)] 80.6 kg (177 lb 11.1 oz) (12/03 0820) Weight change: -0.091 kg (-3.2 oz)  Intake/Output from previous day: 12/02 0701 - 12/03 0700 In: 960 [P.O.:960] Out: 76 [Urine:75; Stool:1]   Lab Results:  Recent Labs  05/22/13 1201 05/23/13 0543  WBC 13.6* 7.6  HGB 12.9* 11.6*  HCT 41.9 38.2*  PLT 200 140*   BMET:  Recent Labs  05/23/13 0543 05/24/13 0700  NA 137 133*  K 3.9 4.9  CL 94* 91*  CO2 27 24  GLUCOSE 122* 106*  BUN 34* 51*  CREATININE 4.98* 6.66*  CALCIUM 8.8 9.0  ALBUMIN 3.1* 3.2*  3.3*   No results found for this basename: PTH,  in the last 72 hours Iron Studies:  Recent Labs  05/23/13 1235  FERRITIN >16500*   EXAM: General appearance:  Alert, in no apparent distress Resp:  CTA without rales, rhonchi, or wheezes Cardio:  RRR without murmur or rub GI:  + BS, soft and nontender Extremities:  No edema Access:  R IJ catheter with BFR 400, maturing AVF @ L[facility]  Dialysis Orders: MWF @ [facility]  78 kg 4 hrs 400/A1.5 2K/2.25Ca Heparin 2400 U R IJ catheter, maturing AVF @ [facility] (placed 9/4 by Dr. Imogene Burn)  Hectorol 4 mcg Epogen 0 Venofer 100 mg x 10 (through 12/15)  Assessment/Plan: 1. Abdominal pain/abnormal liver enzymes - LFTs significantly elevated on admission, now improving, Hep B & C negative, US unremarkable; seen by Dr. Elnoria Howard, received Vitamin K for elevated INR. 2. Hyperkalemia - K elevated at 6.3 on 12/1, now 4.9 s/p HD. 3. ESRD - HD on MWF @ [facility]. HD today. 4. Hypertension/volume - BP 134/55 on Metoprolol 12.5 mg bid; wt 80.6 kg with EDW 78. 5. Anemia - Hgb 12.9, on Venofer  series for T-sat 24%. 6. Metabolic bone disease - Ca 9 (9.6 corrected), P 5.8, last iPTH up to 1042, although Hectorol was increased to 4 mcg; Phoslo 2 with meals. 7. Nutrition - Alb 3.2, renal vitamin. 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     LOS: 2 days   LYLES,CHARLES 05/24/2013,9:34 AM  I have seen and examined patient, discussed with PA and agree with assessment and plan as outlined above. Vinson Moselle MD pager [phone]    cell [phone] 05/24/2013, 12:01 PM

## 2013-05-24 NOTE — Progress Notes (Signed)
Subjective: No acute events.  Feels well at this time.  Objective: Vital signs in last 24 hours: Temp:  [97.1 F (36.2 C)-98.2 F (36.8 C)] 97.1 F (36.2 C) (12/03 0640) Pulse Rate:  [71-80] 76 (12/03 0640) Resp:  [18] 18 (12/03 0640) BP: (122-135)/(53-65) 135/53 mmHg (12/03 0640) SpO2:  [92 %-96 %] 94 % (12/03 0640) Weight:  [179 lb 12.8 oz (81.557 kg)] 179 lb 12.8 oz (81.557 kg) (12/03 0640) Last BM Date: 05/22/13  Intake/Output from previous day: 12/02 0701 - 12/03 0700 In: 960 [P.O.:960] Out: 76 [Urine:75; Stool:1] Intake/Output this shift:    General appearance: alert and no distress GI: soft, non-tender; bowel sounds normal; no masses,  no organomegaly  Lab Results:  Recent Labs  05/22/13 1201 05/23/13 0543  WBC 13.6* 7.6  HGB 12.9* 11.6*  HCT 41.9 38.2*  PLT 200 140*   BMET  Recent Labs  05/22/13 1201 05/23/13 0543 05/24/13 0700  NA 132* 137 133*  K 6.3* 3.9 4.9  CL 84* 94* 91*  CO2 19 27 24   GLUCOSE 69* 122* 106*  BUN 69* 34* 51*  CREATININE 8.42* 4.98* 6.66*  CALCIUM 9.4 8.8 9.0   LFT  Recent Labs  05/23/13 0543 05/24/13 0700  PROT 5.9*  --   ALBUMIN 3.1* 3.2*  AST 2701*  --   ALT 2850*  --   ALKPHOS 138*  --   BILITOT 0.9  --    PT/INR  Recent Labs  05/23/13 1235 05/24/13 0535  LABPROT 22.1* 18.6*  INR 2.00* 1.60*   Hepatitis Panel  Recent Labs  05/22/13 1722  HEPBSAG NEGATIVE  HCVAB NEGATIVE  HEPAIGM NON REACTIVE  HEPBIGM NON REACTIVE   C-Diff No results found for this basename: CDIFFTOX,  in the last 72 hours Fecal Lactopherrin No results found for this basename: FECLLACTOFRN,  in the last 72 hours  Studies/Results: US Abdomen Complete  05/22/2013   CLINICAL DATA:  Elevated liver function tests.  EXAM: ULTRASOUND ABDOMEN COMPLETE  COMPARISON:  Renal ultrasound 05/15/2005.  FINDINGS: Gallbladder:  Status post cholecystectomy.  Common bile duct:  Diameter: Normal in caliber measuring 5.3 mm in the porta hepatis.   Liver:  Slightly nodular liver contour, suggestive of early cirrhosis. Mild diffuse increased echogenicity in the portal triads. No definite intrahepatic biliary ductal dilatation. No focal hepatic lesions. Normal hepatopetal flow in the portal vein.  IVC:  No abnormality visualized.  Pancreas:  Visualized portion unremarkable.  Spleen:  Size and appearance within normal limits.  10.1 cm in length.  Right Kidney:  Length: 5.9 cm in length. Diffusely thinned and echogenic cortex, suggesting medical renal disease. 1.3 x 1.0 x 0.7 cm anechoic lesion with increased through transmission in the interpolar region, compatible with a small cyst. No other mass or hydronephrosis visualized.  Left Kidney:  Length: 6.6 cm length. Diffusely thinned and echogenic cortex, suggesting medical renal disease. 7.1 x 4.6 cm anechoic lesion with increased through transmission extending off the upper pole of the left kidney, compatible with a large simple cyst.  Abdominal aorta:  No aneurysm visualized.  Other findings:  Small volume ascites, most notably around the liver dome. Small right pleural effusion.  IMPRESSION: 1. Echogenic portal triads which may suggest acute hepatitis. Clinical correlation is recommended. 2. Slightly nodular liver contour may suggest early changes of cirrhosis. 3. Trace volume of perihepatic ascites. 4. Small right pleural effusion. 5. Diffuse cortical thinning in both kidneys, which both appear echogenic, indicative of underlying medical renal disease. 6. Renal  cysts bilaterally, as above.   Electronically Signed   By: Trudie Reed M.D.   On: 05/22/2013 18:25   Dg Abd Acute W/chest  05/22/2013   CLINICAL DATA:  Abdominal pain, shortness of breath, nausea, dialysis dependent  EXAM: ACUTE ABDOMEN SERIES (ABDOMEN 2 VIEW & CHEST 1 VIEW)  COMPARISON:  04/25/2013, 02/14/2013  FINDINGS: Small pleural effusions noted. Minor basilar atelectasis. Heart is enlarged. No definite CHF or focal pneumonia. No  pneumothorax. Trachea is midline. Right IJ dialysis catheter tips in the mid SVC. No free air evident.  Nonobstructive bowel gas pattern. Air and stool within the colon. Prior cholecystectomy noted. Atherosclerosis of the abdominal vasculature. No abnormal osseous finding.  IMPRESSION: Small pleural effusions and basilar atelectasis  Cardiomegaly without CHF or pneumonia  Negative for obstruction or free air   Electronically Signed   By: Ruel Favors M.D.   On: 05/22/2013 13:03    Medications:  Scheduled: . doxercalciferol  4 mcg Intravenous Q M,W,F-HD  . ferric gluconate (FERRLECIT/NULECIT) IV  125 mg Intravenous Q M,W,F-HD  . heparin  2,400 Units Dialysis Once in dialysis  . [START ON 05/25/2013] heparin  2,400 Units Dialysis Once in dialysis  . heparin  5,000 Units Subcutaneous Q8H  . metoprolol tartrate  12.5 mg Oral BID  . pantoprazole  40 mg Oral Daily  . sodium chloride  3 mL Intravenous Q12H   Continuous:   Assessment/Plan: 1) Abnormal liver enzymes. 2) Mild coagulopathy.   HIs INR has dropped to 1.6, but he also received Vit K.  No evidence of AMS to suggest fulminant liver failure.  Plan: 1) Await liver panel. 2) If his numbers are improving he can most likely be discharged home later today or tomorrow.  He will need follow up in the office in 1 week upon discharge.   LOS: 2 days   Nitish Roes D 05/24/2013, 8:22 AM

## 2013-05-25 LAB — COPPER, SERUM: Copper: 171 ug/dL (ref 70–175)

## 2013-06-19 ENCOUNTER — Telehealth: Payer: Self-pay | Admitting: *Deleted

## 2013-06-20 ENCOUNTER — Telehealth: Payer: Self-pay | Admitting: *Deleted

## 2013-06-26 NOTE — Telephone Encounter (Signed)
Received VM from patient wife.  She stated patient is "getting stronger", has had follow-up appt with Dr. Janace Hoard, hoping to start home dialysis in a few days after catheter placement.  Will continue to navigate as L3 (treatments completed) patient.  Gayleen Orem, RN, BSN, [facility] Navigator [phone]

## 2013-06-26 NOTE — Telephone Encounter (Signed)
Called patient as part of routine s/p treatment follow-up.  LVM requesting return call.  Gayleen Orem, RN, BSN, [facility] Navigator [phone]

## 2013-06-28 ENCOUNTER — Other Ambulatory Visit: Payer: Self-pay | Admitting: Internal Medicine

## 2013-06-28 DIAGNOSIS — J9610 Chronic respiratory failure, unspecified whether with hypoxia or hypercapnia: Secondary | ICD-10-CM

## 2013-06-29 ENCOUNTER — Ambulatory Visit (INDEPENDENT_AMBULATORY_CARE_PROVIDER_SITE_OTHER): Payer: Medicare Other | Admitting: Internal Medicine

## 2013-06-29 ENCOUNTER — Encounter: Payer: Self-pay | Admitting: Internal Medicine

## 2013-06-29 VITALS — BP 132/66 | HR 73 | Temp 97.9°F | Ht 69.0 in | Wt 180.0 lb

## 2013-06-29 DIAGNOSIS — J9610 Chronic respiratory failure, unspecified whether with hypoxia or hypercapnia: Secondary | ICD-10-CM

## 2013-06-29 DIAGNOSIS — J449 Chronic obstructive pulmonary disease, unspecified: Secondary | ICD-10-CM | POA: Insufficient documentation

## 2013-06-29 DIAGNOSIS — J4489 Other specified chronic obstructive pulmonary disease: Secondary | ICD-10-CM

## 2013-06-29 LAB — PULMONARY FUNCTION TEST
DL/VA % pred: 62 %
DL/VA: 2.85 ml/min/mmHg/L
DLCO unc % pred: 41 %
DLCO unc: 12.75 ml/min/mmHg
FEF 25-75 Post: 0.88 L/sec
FEF 25-75 Pre: 1.18 L/sec
FEF2575-%CHANGE-POST: -25 %
FEF2575-%PRED-POST: 35 %
FEF2575-%Pred-Pre: 47 %
FEV1-%CHANGE-POST: -14 %
FEV1-%PRED-PRE: 63 %
FEV1-%Pred-Post: 54 %
FEV1-Post: 1.75 L
FEV1-Pre: 2.06 L
FEV1FVC-%Change-Post: -9 %
FEV1FVC-%PRED-PRE: 92 %
FEV6-%Change-Post: -4 %
FEV6-%Pred-Post: 67 %
FEV6-%Pred-Pre: 71 %
FEV6-POST: 2.79 L
FEV6-PRE: 2.93 L
FEV6FVC-%Change-Post: 0 %
FEV6FVC-%PRED-PRE: 103 %
FEV6FVC-%Pred-Post: 103 %
FVC-%Change-Post: -5 %
FVC-%PRED-PRE: 69 %
FVC-%Pred-Post: 65 %
FVC-Post: 2.83 L
FVC-Pre: 3.01 L
POST FEV1/FVC RATIO: 62 %
PRE FEV6/FVC RATIO: 99 %
Post FEV6/FVC ratio: 99 %
Pre FEV1/FVC ratio: 69 %
RV % PRED: 67 %
RV: 1.57 L
TLC % pred: 70 %
TLC: 4.78 L

## 2013-06-29 NOTE — Progress Notes (Signed)
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  ESRD on HD.  Hyperkalemia.  Dysphagia.  Hx of GERD.  Diarrhea >> improving.  S/P G tube 8/29.  Anemia of critical illness and chronic disease.  Hx of laryngeal cancer s/p XRT.  HCAP, aspiration  DM-2  Encephalopathy d/t uremia and hypoxemia, resolved.  Pain  Deconditioning.  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 Janace Hoard, final pathology report 01/04/2013) currently undergoing radiation therapy under Dr. Isidore Moos care, presented to [facility] long emergency department 8/22 after found to be more lethargic at home by his wife. In ER pt explained he had felt progressively weaker over the past several days and unable to ambulate, went to bed the night before and wife was unable to wake him up that morning. Wife also explained patient has been more confused over the past several days. Patient also reported progressively worsening shortness of breath and feeling that he cannot take full deep breath, worse with exertion and somewhat improved with rest, 2-3 pillow orthopnea.He was first diagnosed with kidney disease about 7 years ago but has never had any workup  done. In the emergency department, patient found to be hypoxic with oxygen saturation in the 80s, improved with 4 L of nasal cannula. Electrolyte panel significant for hyperkalemia K = 7.0, creatinine equal 11.22. TRH asked to admit to [facility] hospital for further evaluation and management and due to hypoxia, SDU bed requested.  He was transferred to [facility] on 8/22 for HD and Permacath placement and emergent HD. PCCM was asked to see for progressive respiratory failure which required NIPPV. He received Emergent HD, also was placed on cycling NIPPV and oxygen, as well as empiric antibiotics to cover for possible aspiration pneumonia. On 8/24 he had acute worsening agitation and hypoxia. This did not improve with NIPPV and he was therefore intubated. He was noted to be difficult intubation due to his active anatomical changes from radiation and his cancer. He was supported on full vent support using low tidal volume ventilation. ENT was consulted and recommended tracheostomy. A size 8 cuffed shiley was placed on 8/26. On 8/26 we were able to start weaning efforts. We were able to d/c vent from room by 8/27 and continue ATC only. FEES study was performed on 8/27 and showed: Severe pharyngeal dysphagia w/ noted aspiration even with honey thick consistency liquids.  On 8/28 he had been moved to the SDU setting. His encephalopathy had resolved, he was off the vent 24/7. Afib w/ RVR which had occurred while acutely ill had resolved and he was in NSR, and he had completed his full course to therapy of antibiotics for aspiration PNA. Rehab services  including PT/OT and SLp were following. He continued to fail dysphagia screening and PEG was recommended. PEG tube was placed on 8/29. Later that day on 8/29 he was seen by vascular surgery for permament HD access/ fistula. Vein mapping was completed on 8/31. On 9/2 Nephrology raised concern about trach status and eligibility to go to dialysis center. Said he would have to be able to  have trach capped during HD treatments in order to be eligible (due to infection control policy). Because of this we decreased his trach from a 8 cuffless to 6 cuffless shiley and started red-cap trials. He had repeat SLP eval and MBS completed on 9/2 which showed marked improvement in swallowing function and a dysphagia 3/ mechanically soft diet was started w/ thin liquids as a trial. He had AVF scheduled for 9/3 and this was completed. Post-op had almost 1 gm hgb drift. Vascular followed for small hematoma. He did receive 2 units of blood on 9/5. He continued to improve over the following days and was deemed stable for discharge.  At time of discharge he has his trach capped during the day, all day, and he is tolerating room air 24 hours a day. He will go home with and continuing PT/OT. He is getting nutrition orally and is able to supplement via PEG if needed. He is undergoing scheduled HD treatments. He will be discharged to home with the following plan of care outlined below.    04/25/2013  Post hosp ov/[name] re: weakness  Chief Complaint  Patient presents with  . Pulmonary Consult    Referred per Dr. Maceo Pro for eval of hypoxia. Pt's spouse reports he is restless at night and sats drop into the 70's. Pt reports his breathing usually only bothers him at night, but today he is having minimal DOE. Sats when he arrived 87%ra--increased to 92%ra with pursed lip breathing after appro 20 seconds.   at discharge was not on 02  rec Zantac 300 mg at bedtime GERDdiet  schedule 24 h 02 at 2lpm      05/09/2013 f/u ov/Jacqulynn Shappell re: 02 dep resp failure/ asp/ vol overload Chief Complaint  Patient presents with  . Follow-up    Pt states breathing has improved some with o2 at night.   Only using 02 at hs  Has IS 1500-2000 In W/C due to leg cramping Room to room sob sometimes with desat rec I will notify Dr Lorrene Reid re keeping the fluid off your lungs by keeping your volume status as low as you can tolerate I  would recommend you see Dr Radford Pax to discuss recent echo  Keep using the incentive spirometer Please see patient coordinator before you leave today  to schedule humidify 02 Definitely wear the 02 at night and any time your 02 level is lower than 90% during the day.   Admitted with abd pain   06/29/2013 f/u ov/Mirjana Tarleton re:  GOLD II copd Chief Complaint  Patient presents with  . Followup with PFT    Pt reports his breathing continues to improve. No new co's today.    no longer using 02 x 2 weeks consistently > 94% x drops sometimes while supine Limited by legs from walking any distance at all    No obvious day to day or daytime variabilty or assoc chronic cough or cp or chest tightness, subjective wheeze overt sinus or hb symptoms. No unusual exp hx or h/o childhood pna/ asthma or knowledge of premature birth.  Sleeping ok without nocturnal  or  early am exacerbation  of respiratory  c/o's or need for noct saba. Also denies any obvious fluctuation of symptoms with weather or environmental changes or other aggravating or alleviating factors except as outlined above   Current Medications, Allergies, Complete Past Medical History, Past Surgical History, Family History, and Social History were reviewed in [employer] record.  ROS  The following are not active complaints unless bolded sore throat, dysphagia, dental problems, itching, sneezing,  nasal congestion or excess/ purulent secretions, ear ache,   fever, chills, sweats, unintended wt loss, pleuritic or exertional cp, hemoptysis,  orthopnea pnd or leg swelling, presyncope, palpitations, heartburn, abdominal pain, anorexia, nausea, vomiting, diarrhea  or change in bowel or urinary habits, change in stools or urine, dysuria,hematuria,  rash, arthralgias, visual complaints, headache, numbness weakness or ataxia or problems with walking or coordination,  change in mood/affect or memory.                Objective:    Physical Exam   W/c bound wm nad  Very hoarse   06/29/2013          180 Wt Readings from Last 3 Encounters:  05/09/13 175 lb (79.379 kg)  04/25/13 178 lb (80.74 kg)  04/19/13 190 lb (86.183 kg)        HEENT: nl dentition, turbinates, and orophanx. Nl external ear canals without cough reflex   NECK :  without JVD/Nodes/TM/ nl carotid upstrokes bilaterally/ trach site healing nicely   LUNGS: no acc muscle use, clear to A and P bilaterally without cough on insp or exp maneuvers   CV:  RRR  no s3 or murmur or increase in P2, trace bilateral  edema   ABD:  soft and nontender with nl excursion in the supine position. No bruits or organomegaly, bowel sounds nl  MS:  warm without deformities, calf tenderness, cyanosis or clubbing  SKIN: warm and dry without lesions        Echo 04/04/13 Left ventricle: The cavity size was normal. There was moderate focal basal and mild concentric hypertrophy. Systolic function was normal. Wall motion was normal; there were no regional wall motion abnormalities. Features are consistent with a pseudonormal left ventricular filling pattern, with concomitant abnormal relaxation and increased filling pressure (grade 2 diastolic dysfunction). - Aortic valve: Moderate thickening and calcification, consistent with sclerosis. Moderate regurgitation. - Mitral valve: Mild to moderate regurgitation. - Left atrium: The atrium was mildly dilated.  CXR  05/22/13 Small pleural effusions and basilar atelectasis  Cardiomegaly without CHF or pneumonia .      Assessment & Plan:

## 2013-06-29 NOTE — Patient Instructions (Addendum)
I recommend you continue 02 2lpm only  at bedtime for now and if you do a lot better over time and want to try off the 02 then call [phone] and ask for [facility] to arrange an  overnight 02 study off 02 to be sure this is safe  Pulmonary follow up is as needed

## 2013-06-29 NOTE — Progress Notes (Signed)
PFT done today. 

## 2013-07-01 NOTE — Assessment & Plan Note (Signed)
-   started on 02 2lpm 24/7 04/25/2013 for sat 87% at rest RA - changed to noct 02 only 06/29/13   sats consistenly > 94% daytime but for now should continue the hs 02 so as not to risk desats causing cardiac decompensation

## 2013-07-01 NOTE — Assessment & Plan Note (Signed)
PFT's 06/29/2013 FEV1  2.06 (63%) ratio 69 and no change p B2   DLCO 41 corrects to 62%  I had an extended summary discussion with the patient and wife today lasting 15 to 20 minutes of a 25 minute visit on the following issues:     although there may be significant copd present, it does not appear to be limiting activity tolerance any more than a set of worn tires limits someone from driving a car  around a parking lot.  A new set of Michelins might look good but would have no perceived impact on the performance of the car and would not be worth the cost.  That is to say:   this pt is so sedentary I don't recommend aggressive pulmonary rx at this point unless limiting symptoms arise or acute exacerbations become as issue, neither of which are the case now.  I asked the patient to contact this office at any time in the future should either of these problems arise.

## 2013-07-11 ENCOUNTER — Ambulatory Visit (INDEPENDENT_AMBULATORY_CARE_PROVIDER_SITE_OTHER): Payer: Medicare Other | Admitting: Surgery

## 2013-07-11 ENCOUNTER — Encounter (INDEPENDENT_AMBULATORY_CARE_PROVIDER_SITE_OTHER): Payer: Self-pay | Admitting: Surgery

## 2013-07-11 ENCOUNTER — Encounter (INDEPENDENT_AMBULATORY_CARE_PROVIDER_SITE_OTHER): Payer: Self-pay

## 2013-07-11 VITALS — BP 118/78 | HR 78 | Resp 12 | Ht 70.0 in | Wt 178.0 lb

## 2013-07-11 DIAGNOSIS — J449 Chronic obstructive pulmonary disease, unspecified: Secondary | ICD-10-CM

## 2013-07-11 DIAGNOSIS — M6200 Separation of muscle (nontraumatic), unspecified site: Secondary | ICD-10-CM

## 2013-07-11 DIAGNOSIS — N186 End stage renal disease: Secondary | ICD-10-CM

## 2013-07-11 DIAGNOSIS — M6208 Separation of muscle (nontraumatic), other site: Secondary | ICD-10-CM | POA: Insufficient documentation

## 2013-07-11 NOTE — Patient Instructions (Signed)
See the Handout(s) we gave you.  Consider surgery For laparoscopic exploration and placement of peritoneal dialysis catheter.    Please call our office at [phone] if you wish to schedule surgery or if you have further questions / concerns.   Peritoneal Dialysis (CAPD) Catheter  Healthy kidneys remove excess water and waste products from the body in the form of urine. When the kidneys cannot do this, serious problems develop and a person can fall into kidney failure.   In most cases, the kidneys can heal and recover to a better place.  Sometimes the kidneys remain somewhat damaged and a kidney specialist (nephrologist) needs to help manage the disease with medications and adjustments in diet.    Sometimes the kidney failure has progressed too far.  The body's waste and fluid build up in the blood. Hands and legs swell. You start to feel more weak or nauseated. Your blood pressure may rise, to seeing you at serious risk for heart attack and stroke.  If not treated, you will eventually die. Dialysis is a treatment that replaces the work that your kidneys would do if they were healthy to help keep you alive.  Dialysis can be done using a machine outside of the body (hemodialysis) connected to your blood; or, it can be done with a tube placed to enter your abdomen (peritoneal dialysis). The peritoneum is the inner lining of your abdominal cavity, covering the inner organs & abdominal wall.  This inner lining of peritoneum works like a filter.   Peritoneal dialysis involves placed several quarts of sterile fluid into the inner abdomen/peritoneal cavity.  The waste products and fluids can exchange across the peritoneal membrane.  The fluid is later removed.  This most poor every day.  Sometimes the catheter can be attached to a machine that runs at night while you sleep (continuous cycler-assisted dialysis = CCPD).  Sometimes the fluid can be infused in her abdomen and then later removed hours later  (continuous ambulatory peritoneal dialysis = CAPD).  If your nephrologist feels like you are a good candidate, you will be sent to be evaluated by a surgeon to consider placement of a peritoneal dialysis catheter.  Your nephrologist should have shown you educational videos .  You should discuss with dialysis nurses about what life with a peritoneal dialysis catheter is like.  Most patients who have not had many abdominal operations are reasonable candidates for placement of a peritoneal dialysis catheter   It is important to understand the risks and benefits of the procedure prior to undergoing surgery.    If your surgeon feels you are a reasonable candidate, you will have a peritoneal dialysis catheter placed.    This requires an operation under general anesthesia.  The surgeon places a soft plastic tube ([place] curl catheter) into your abdomen.  The deeper curled tip rests down in the pelvis.  The middle part is tunneled inside your abdominal wall where cuffs provide a water-tight seal.  This leaves an outer foot of plastic tubing resting outside your body, usually in your lower abdomen below the beltline near your waist.  This is a quick procedure that can often be done as an outpatient with the possibility of staying overnight.  It takes several weeks for the catheter to heal into a watertight seal.  Initially, your nephrologist we will have the dialysis nurses do gentle flushes through the catheter.  Once the catheter is well sealed in, you will begin larger exchanges of fluid to do  full peritoneal dialysis.  Your nephrologist and dialysis nurses will help guide you through this.  While placement of the catheter is usually not technically difficult, there are risks to the procedure.  The biggest risk is infection.  This is why we place the catheter in the operating room with the use of intravenous antibiotics.  Using sterile technique to hook up the catheter to your dialysis fluids is essential.  Most  infections of the catheter are mild and can be treated with antibiotics placed in the vein were placed with the peritoneal fluid.  However, sometimes the infection worsens or persists and the catheter needs to be removed.  Occasionally, the catheters can be blocked due to inner organs plugging up the tubing or kinks.  Sometimes the catheter leaks and needs to be replaced.  Sometimes the inner abdomen has too many adhesions from prior surgeries or infections and there is not enough space for fluid exchanges to occur.  Sometimes, peritoneal dialysis is not enough to compensate for the kidney failure.   In rare instances, the inner lining of the abdomen becomes severely thickened or inflamed and peritoneal dialysis can no longer work.  Sometimes it is not possible to safely replace a new catheter and the patient must go on hemodialysis permanently.  It is very common to need hemodialysis intermittently even if you have a peritoneal dialysis catheter in cases of emergencies or if peritoneal dialysis fails.  Your nephrologist and surgeon can help you decide what the best form of dialysis is for you  PERITONEAL DIALYSIS (CAPD) CATHETER PLACEMENT:  POST OPERATIVE INSTRUCTIONS  1. DIET: Follow a light bland diet the first 24 hours after arrival home, such as soup, liquids, crackers, etc.  Be sure to include lots of fluids daily.  Avoid fast food or heavy meals as your are more likely to get nauseated.   2. Take your usually prescribed home medications unless otherwise directed. 3. PAIN CONTROL: a. Pain is best controlled by a usual combination of three different methods TOGETHER: i. Ice/Heat ii. Tylenol (over the counter pain medication) iii. Prescription pain medication b. Most patients will experience some swelling and bruising around the incisions.  Ice packs or heating pads (30-60 minutes up to 6 times a day) will help. Use ice for the first few days to help decrease swelling and bruising, then switch to  heat to help relax tight/sore spots and speed recovery.  Some people prefer to use ice alone, heat alone, alternating between ice & heat.  Experiment to what works for you.  Swelling and bruising can take several weeks to resolve.   c. It is helpful to take an over-the-counter pain medication regularly for the first few weeks.  Using acetaminophen (Tylenol, etc) 500-650mg  four times a day (every meal & bedtime) is usually safest since NSAIDs are not advisable in patients with kidney disease. d. A  prescription for pain medication (such as oxycodone, hydrocodone, etc) should be given to you upon discharge.  Take your pain medication as prescribed.  i. If you are having problems/concerns with the prescription medicine (does not control pain, nausea, vomiting, rash, itching, etc), please call us [phone] to see if we need to switch you to a different pain medicine that will work better for you and/or control your side effect better. ii. If you need a refill on your pain medication, please contact your pharmacy.  They will contact our office to request authorization. Prescriptions will not be filled after 5 pm or on  week-ends. 4. Avoid getting constipated.  Between the surgery and the pain medications, it is common to experience some constipation.  Increasing fluid intake and taking a fiber supplement (such as Metamucil, Citrucel, FiberCon, MiraLax, etc) 1-2 times a day regularly will usually help prevent this problem from occurring.  A mild laxative (prune juice, Milk of Magnesia, MiraLax, etc) should be taken according to package directions if there are no bowel movements after 48 hours.   5. Wash / shower every day.  You may shower over the dressings as they are waterproof.  Continue to shower over incision(s) after the dressing is off. 6. The Peritoneal Dialysis nurse will remove your waterproof bandages in the Dialysis Center a few days after surgery.  Do not remove the bandages until seen by  them. 7. ACTIVITIES as tolerated:   a. You may resume regular (light) daily activities beginning the next day-such as daily self-care, walking, climbing stairs-gradually increasing activities as tolerated.  If you can walk 30 minutes without difficulty, it is safe to try more intense activity such as jogging, treadmill, bicycling, low-impact aerobics, swimming, etc. b. Save the most intensive and strenuous activity for last such as sit-ups, heavy lifting, contact sports, etc  Refrain from any heavy lifting or straining until you are off narcotics for pain control.   c. DO NOT PUSH THROUGH PAIN.  Let pain be your guide: If it hurts to do something, don't do it.  Pain is your body warning you to avoid that activity for another week until the pain goes down. d. You may drive when you are no longer taking prescription pain medication, you can comfortably wear a seatbelt, and you can safely maneuver your car and apply brakes. e. Dennis Bast may have sexual intercourse when it is comfortable.  FOLLOW UP with the Peritoneal Dialysis nurses closely after surgery.  Call [phone] to help arrange training/flushes of cathete    -The CAPD nurses & Nephrology usually follow you closely, making the need for follow-up in our office redundant and therefore not needed.  If they or you have concerns, please call us for possible follow-up in our office  -Please call [facility] at [phone] only as needed.  WHEN TO CALL us [phone]: 1. Poor pain control 2. Reactions / problems with new medications (rash/itching, nausea, etc)  3. Fever over 101.5 F (38.5 C) 4. Worsening swelling or bruising 5. Continued bleeding from incision. 6. Increased pain, redness, or drainage from the incision   The clinic staff is available to answer your questions during regular business hours (8:30am-5pm).  Please don't hesitate to call and ask to speak to one of our nurses for clinical concerns.   If you have a medical emergency, go to  the nearest emergency room or call 911.  A surgeon from [facility] is always on call at the hospitals  9. IF YOU HAVE DISABILITY OR FAMILY LEAVE FORMS, BRING THEM TO THE OFFICE FOR PROCESSING.  DO NOT GIVE THEM TO YOUR DOCTOR.  [facility], [address], [place], [place], [place]  [address] ? MAIN: [phone] ? TOLL FREE: [phone] ?  FAX [phone] [email or web address]  Peritoneal Dialysis - An Overview Dialysis can be done using a machine outside of the body (hemodialysis). Or, it can be done inside the body (peritoneal dialysis). The word "peritoneal" refers to the lining or membrane of the belly (abdominal cavity). The peritoneal membrane is a thin, plastic-like lining inside the belly that covers  the organs and fits in the abdominal or peritoneal cavity, such as the stomach, liver and the kidneys. This lining works like a filter. It will allow certain things to pass from your blood through the lining and into a special solution that has been placed into your belly. In this type of dialysis, the peritoneum is used to help clean the blood.  If you need dialysis, your kidneys are not working right. Healthy kidneys take out extra water and waste products, which becomes urine. When the kidneys do not do this, serious problems can develop. The waste and water build up in the blood. Your hands and feet might swell. You may feel tired, weak or sick to your stomach. Also, your blood pressure may rise. If not treated, you could die. Dialysis is a treatment that does the work that your kidneys would do if they were healthy.  It cleans your blood.   It will make sure your body has the right amount of certain chemicals that it needs. They include potassium, sodium and bicarbonate.   It will help control your blood pressure.  UNDERSTANDING PERITONEAL DIALYSIS  Here is how peritoneal dialysis works:   First, you will have surgery to put a  soft plastic tube (catheter) into your belly (abdomen). This will allow you to easily connect yourself to special tubing, which will then let a special dialysis solution to be placed into your abdomen.   For each treatment, you will need at least one bag of dialysis solution (a liquid called dialysate). It is a mix of water that is pure and free of germs (sterile), sugar (dextrose) and the nutrients and minerals found in your blood. Sometimes, more than one bag is needed to get the right amount of fluid for your abdomen. Your caregiver will explain what size and how many bags you will need.   The dialysate is slowly put through the catheter to fill the abdomen (called the peritoneal cavity). This dialysate will need to stay in your body for 3-4 hours. This is known as the dwell time.   The solution is working to clean the blood and remove wastes from your body. At the end of this time, the solution is drained from your body through tubing into an empty bag. It is then replaced with a fresh dialysate.   The draining and replacing of the dialysate is called an exchange or cycle. The catheter is capped after each exchange. Once the solution is in your body, you are then free to do whatever you would like until the next exchange. Most people will need to do 4-5 exchanges each day.   There are two different methods that can be used.   Continuous ambulatory peritoneal dialysis (CAPD): You put the solution into your abdomen, cap your catheter and then go about your day. Several hours later, you reconnect to a tubing set up, drain out the solution and then put more solution in. This is done several times a day. No machine is needed.   Continuous cycler-assisted peritoneal dialysis (CCPD): A machine is used, which fills the abdomen with dialysate and then drains it. This happens several times. It usually is done at night while you are sleeping. When you wake up, you can disconnect from the machine and are free  to go to go about your day.  PREPARING FOR EXCHANGES  Discuss the details of the procedure with your caregivers. You will be working with a nurse who is specially trained in doing dialysis.  Make sure you understand:   How to do an exchange.   How much solution you need.   What type of solution you will need.   How often you should do an exchange. Ask:   How many times each day?   When? At meals? At bedtime?   Always keep the dialysate bags and other supplies in a cool, clean and dry place.   Keeping everything clean is very important.   The catheter and its cap must be free from germs (sterile)   The adapter also must be sterile. It attaches the dialysis bag and tubing to the catheter.   Clean the area of your body around the catheter every day. Use a chemical that fights infection (antiseptic).   Wash your hands thoroughly before starting an exchange.   You may be taught to wear a mask to cover your nose and mouth. This makes infection less likely to happen.   You may be taught to close doors, windows and turn off any fans before doing an exchange.   Check the dialysate bag very carefully.   Make sure it is the right size bag for you. This information is on the label.   Also, make sure it is the right mixture. For some people, the dialysate contents vary. For instance, the mixture might be a stronger solution for overnight.   Check the expiration date (the last date you can use the bag). It also is on the label. If the date has gone by, throw away the bag.   The solution should be clear. You should be able to see any writing on the side of the bag clearly through the solution. Do not use a cloudy solution.   Gently squeeze the bag to make sure there are no leaks.   Use a dry heating pad to warm the dialysate in the bag. Leave the cover on the bag while you do this.   This is for comfort. You can skip this step if you want.   Never place the bag of solution under warm  or hot water. Water from a faucet is not sterile and could cause germs to get into the bag. Infection could then result.  PERFORMING AN EXCHANGE  For continuous ambulatory dialysis:   Attach the dialysis bag and tubing to your catheter. Hang the bag so that gravity (the natural downward pull) draws the solution down and into your abdomen once the clamps are opened. This should take about 10 minutes.   Remove the bag and tubing from the catheter. Cap the catheter.   The solution stays in the abdomen for 3-4 hours (dwell time). The solution is working to clean the blood and remove wastes from your body.   When you are ready to drain the solution for another exchange, take the cap off the catheter. Then, attach the catheter to tubing, which is attached to an empty bag. Place this empty bag below the abdomen or on the floor or stool and undo the clamps.   Gravity helps pull the fluid out of the abdomen and into the bag. The fluid in the bag may look yellow and clear, like urine. It usually takes about 20 minutes to drain the fluid out of the abdomen.   When the solution has drained, start the process again by infusing a new bag of dialysate and then capping the catheter.   This should continue until you have used all of the solution that you are to use each  day.   Sometimes, a small machine is used overnight. It is called a mini-cycler. This is done if the body cannot go all night without an exchange. The machine lets you sleep without having to get up and do an exchange.   For continuous cycler-assisted dialysis:   You will be taught how to set up or program your machine.   When you are ready for bed, put the dialysate bags onto the cycler machine. Put on exactly the number of bags that your caregiver said to use.   Connect your catheter to the machine and turn the cycler machine on.   Overnight, the cycler will do several exchanges. It often does three to five, sometimes more.   Solution  that is in your abdomen in the morning will stay during the day. The machine is set to make the daytime solution stronger, if that is needed.   In the morning, you will disconnect from the machine and cap your catheter and go about your day.   Sometimes, an extra exchange is done during the day. This may be needed to remove excess waste or fluid.  IMPORTANT REMINDERS  You will need to follow a very strict schedule. Every step of the dialysis procedure must be done every day. Sometimes, several times a day. Altogether, this might take an extra 2 hours or more. However, you must stick to the routine. Do not skip a day. Do not skip a procedure.   Some people find it helpful to work with a Social worker or Education officer, museum in addition to the renal (kidney) nurse. They can help you figure out how to change your daily routine to fit in the dialysis sessions.   You may need to change your diet. Ask your caregiver for advice, or talk with a nutritionist about what you should and should not eat.   You will need to weigh yourself every day and keep track of what your weight is.   You may be taught how to check your blood pressure before every exchange. Your blood pressure reading will help determine what type of solution to use. If your blood pressure is too high, you may need a stronger solution.  RISKS AND COMPLICATIONS  Possible problems vary, depending on the method you use. Your overall health also can have an effect. Problems that could develop because of dialysis include:  Infection. This is the most common problem. It could occur:   In the peritoneum. This is called peritonitis.   Around the catheter.   Weight gain. The dialysate contains a type of sugar known as dextrose. Dextrose has a lot of calories. The body takes in several hundred calories from this sugar each day.   Weakened muscles in the abdomen. This can result from all of the fluid that your body has to hold in the abdomen.   Catheter  replacement. Sometimes, a new one has to be put in.   Change in dialysis method. Due to some complications, you may need to change to hemodialysis for a short time and have your dialysis done at a center.   Trouble adjusting to your new lifestyle. In some people, this leads to depression.   Sleep problems.   Dialysis-related amyloidosis. This sometimes occurs after 5 years of dialysis. Protein builds up in the blood. This can cause painful deposits on bones, joints and tendons (which connect muscle to bone). Or, it can cause hollow spots in bones that make them more likely to break.   Excess  fluid. Your body may absorb too much of the fluid that is held in the abdomen. This can lead to heart or lung problems.  SEEK MEDICAL CARE IF:   You have any problems with an exchange.   The area around the catheter becomes red or painful.   The catheter seems loose, or it feels like it is coming out.   A bag of dialysate looks cloudy. Or, the liquid is an unusual color.   Abdominal pain or discomfort.   You feel sick to your stomach (nauseous) or throw up (vomit).   You develop a fever of more than 102 F (38.9 C).  SEEK IMMEDIATE MEDICAL CARE IF:  You develop a fever of more than 102 F (38.9 C). Document Released: 04/05/2009 Document Revised: 05/28/2011 Document Reviewed: 04/05/2009 [facility] Patient Information 2012 [place].  Diet for Peritoneal Dialysis This diet may be modified in protein, sodium, phosphorus, potassium, or fluid, depending on your needs. The goals of nutrition therapy are similar to those for patients on hemodialysis. Providing enough protein to replace peritoneal losses is a priority. USES OF THIS DIET The diet is designed for the patient with end-stage kidney (renal) disease, who is treated by peritoneal dialysis. Treatment options include:  Continuous Ambulatory Peritoneal Dialysis (CAPD): Usually 4 exchanges of 1.5 to 2 liter volumes of glucose (sugar) and  electrolyte-containing dialysate.   Continuous Cyclic Peritoneal Dialysis (CCPD): Essentially a reversal of CAPD, with shorter exchanges at night and a longer one during the day.   Intermittent Peritoneal Dialysis (IPD): 10 to 12 hours of exchanges, 2 to 3 times weekly.  ADEQUACY The diet may not meet the Recommended Dietary Allowances of the [employer] for calcium and ascorbic acid. Protein and water-soluble vitamin needs may be increased because of losses into the dialysate. Recommended daily supplements are the same as for hemodialysis patients. ASSESSMENT/DETERMINATION OF DIET Dietary needs will differ between patients. Parameters must be individualized. Protein  Guidelines: 1.2 to 1.3 gm/kg/day OR 1.5 gm/kg/day if patient is malnourished, catabolic, or has a protracted episode of peritonitis. A minimum of 50% of the protein intake should be of high biological value.   Goals: Meet protein requirements and replace dialysate losses while avoiding excessive accumulation of waste products. Achieve serum albumin greater than 3.5 g/dL.   Evaluate: Current nutritional status, serum albumin and BUN levels, presence of peritonitis.  Sodium  Guidelines: Usually 90 to 175 mEq (2000 to 4000 mg), but should be individualized.   Goals: Minimize complications of fluid imbalance.   Evaluate: Weight, blood pressure regulation, and presence of swelling (edema).  Potassium  Guidelines: Individualized; often not restricted, and may need to be supplemented.   Goals: Serum K+ levels between 4.0 to 5.0 mEq/L.   Evaluate: Serum K+ levels, usual intake of K+, appetite.  Phosphorus  Guidelines: 800 to 1200 mg/day (the high protein intake results in a high obligatory P intake).   Goal: Serum P levels between 4.5 to 6.0 mg/dL.   Evaluate: Serum P levels, usual P intake, P-binding medications: type, number, dosage, distribution.  Fluids  Guidelines: Individualized - may not be restricted  for all patients.   Goal: Minimize complications of fluid imbalance.   Evaluate: Weight, blood pressure regulation, sodium intake, and presence of edema.  Document Released: 06/08/2005 Document Revised: 05/28/2011 Document Reviewed: 08/31/2006 [facility] Patient Information 2012 [place].

## 2013-07-11 NOTE — Progress Notes (Signed)
Subjective:     Patient ID: Victor Lane, male   DOB: 1946-04-22, 68 y.o.   MRN: [id number]  HPI  Note: This dictation was prepared with Dragon/digital dictation along with [employer]. Any transcriptional errors that result from this process are unintentional.       SHOWN DISSINGER  January 21, 1946 [id number]  Patient Care Team: [name], MD as PCP - General (Family Medicine) Sueanne Margarita, MD (Cardiology) Brooks Sailors, RN as Registered Nurse (Oncology) Tanda Rockers, MD as Consulting Physician (Pulmonary Disease) Lucrezia Starch, MD as Consulting Physician (Nephrology)  This patient is a 68 y.o.male who presents today for surgical evaluation at the request of Dr. Lorrene Reid.   Reason for visit: Dialysis-dependent.  Request for peritoneal dialysis.  Pleasant male.   Comes today with his wife.  Became severely sick in the hospital in a coma.  Multisystem organ failure.  Tracheostomy.  Renal failure.  Has gradually been recovering.  However, he has persistent renal failure.  Undergoing hemodialysis.  Fair tolerance to hemodialysis.  Wish to consider peritoneal dialysis.  Makes one half-one cup of urine a day.  He gets hemodialysis Monday/Wednesday/Friday through a left upper arm AV fistula.  He was severely deconditioned.  Gradually able to do more.  Can walk about 10 minutes before he has to stop.  Has a bowel movement one-2 times a day.  Had cholecystectomy in the past.  Had a stomach feeding tube placed in the left upper quadrant.  That was removed in October.  No problems since.  He used to smoke but quit almost 20 years ago.  No history of MRSA or infections.  Patient Active Problem List   Diagnosis Date Noted  . Diastasis recti 07/11/2013  . COPD GOLD II 06/29/2013  . Hepatitis 05/22/2013  . Chronic respiratory failure 04/25/2013  . Leg weakness, bilateral 04/19/2013  . Pain in limb 04/19/2013  . Neuropathy 04/19/2013  . Lumbago 04/19/2013  . Other malaise  and fatigue 04/19/2013  . End stage renal disease 03/31/2013  . Tracheostomy status 02/23/2013  . Dysphagia 02/16/2013  . Aspiration pneumonia 02/16/2013  . Protein-calorie malnutrition, severe 02/12/2013  . Acute respiratory failure 02/10/2013  . Altered mental status 02/10/2013  . Diabetes mellitus 02/10/2013  . HTN (hypertension) 02/10/2013  . Hyperkalemia 02/10/2013  . ESRD (end stage renal disease) on dialysis 02/10/2013  . Anemia of chronic disease 02/10/2013  . Transaminitis 02/10/2013  . High anion gap metabolic acidosis 67/89/3810  . 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 Occlusion and stenosis of carotid artery without mention of cerebral infarction 03/24/2011    Past Medical History  Diagnosis Date  . Diabetes mellitus   . Hypertension   . GERD (gastroesophageal reflux disease)   . Fibromyalgia   . Chest pain   . Carotid artery occlusion   . Chronic kidney disease     - Stage 5- follwed by Dr Justin Mend  . Complication of anesthesia     agiation when he awaken  . Neuropathy   . Arthritis   . Sleep apnea     not on CPAP  . H/O vitamin D deficiency   . Hearing loss   . Hypercholesteremia   . Myalgia and myositis   . 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 S/P radiation therapy 01/24/2013-03/14/2013    Larynx/glottis / 63 Gy in 28 fractions    Past Surgical History  Procedure Laterality  Date  . Cholecystectomy  1993  . Rotator cuff repair  07/06/2011    right  . Carotid endarterectomy Left   . Coolonoscopy    . Colonoscopy w/ biopsies and polypectomy      Hx: of  . Microlaryngoscopy with laser N/A 01/04/2013    Procedure: MICROLARYNGOSCOPY WITH BIOPSY/LASER;  Surgeon: [name], MD;  Location: [facility];  Service: ENT;  Laterality: N/A;  . Av fistula placement Left 02/23/2013    Procedure: ARTERIOVENOUS (AV) FISTULA CREATION;  Surgeon: Conrad Wheatland, MD;  Location: [facility];  Service: Vascular;  Laterality: Left;     History   Social History  . Marital Status: Married    Spouse Name: Fraser Din    Number of Children: 1  . Years of Education: HS   Occupational History  .      Self-Employed   Social History Main Topics  . Smoking status: Former Smoker -- 2.00 packs/day for 31 years    Types: Cigarettes    Quit date: 06/23/1995  . Smokeless tobacco: Never Used  . Alcohol Use: No  . Drug Use: No  . Sexual Activity: Not on file   Other Topics Concern  . Not on file   Social History Narrative   Patient lives at home with spouse.   Caffiene Use: 1 cup daily    Family History  Problem Relation Age of Onset  . Adopted: Yes    Current Outpatient Prescriptions  Medication Sig Dispense Refill  . aspirin 81 MG tablet Take 81 mg by mouth daily.       . calcium acetate (PHOSLO) 667 MG capsule Take 1,334 mg by mouth 3 (three) times daily with meals.      Marland Kitchen esomeprazole (NEXIUM) 40 MG capsule Take 40 mg by mouth daily before breakfast.      . metoprolol tartrate (LOPRESSOR) 25 MG tablet Take 0.5 tablets (12.5 mg total) by mouth 2 (two) times daily.      . multivitamin (RENA-VIT) TABS tablet Take 1 tablet by mouth at bedtime.      . Nutritional Supplements (FEEDING SUPPLEMENT, NEPRO CARB STEADY,) LIQD Take 237 mLs by mouth 2 (two) times daily between meals.    0  . traMADol (ULTRAM) 50 MG tablet Take 50 mg by mouth every 4 (four) hours as needed for moderate pain.        No current facility-administered medications for this visit.     Allergies  Allergen Reactions  . Amoxicillin     Causes upset stomach  . Fentanyl     Confusion when patch applied    BP 118/78  Pulse 78  Resp 12  Ht 5\' 10"  (1.778 m)  Wt 178 lb (80.74 kg)  BMI 25.54 kg/m2  No results found.   Review of Systems  Constitutional: Negative for fever, chills and diaphoresis.  HENT: Negative for ear discharge, facial swelling, mouth sores, nosebleeds, sore throat and trouble swallowing.   Eyes: Negative for photophobia,  discharge and visual disturbance.  Respiratory: Negative for choking, chest tightness, shortness of breath and stridor.   Cardiovascular: Negative for chest pain and palpitations.  Gastrointestinal: Negative for nausea, vomiting, abdominal pain, diarrhea, constipation, blood in stool, abdominal distention, anal bleeding and rectal pain.  Endocrine: Negative for cold intolerance and heat intolerance.  Genitourinary: Negative for dysuria, urgency, difficulty urinating and testicular pain.  Musculoskeletal: Negative for arthralgias, back pain, gait problem and myalgias.  Skin: Negative for color change, pallor, rash and wound.  Allergic/Immunologic: Negative  for environmental allergies and food allergies.  Neurological: Negative for dizziness, speech difficulty, weakness, numbness and headaches.  Hematological: Negative for adenopathy. Does not bruise/bleed easily.  Psychiatric/Behavioral: Negative for hallucinations, confusion and agitation.       Objective:   Physical Exam  Constitutional: He is oriented to person, place, and time. He appears well-developed and well-nourished. He appears cachectic. He is active and cooperative.  Non-toxic appearance. He has a sickly appearance. No distress.  HENT:  Head: Normocephalic.  Mouth/Throat: Oropharynx is clear and moist. No oropharyngeal exudate.  Eyes: Conjunctivae and EOM are normal. Pupils are equal, round, and reactive to light. No scleral icterus.  Neck: Normal range of motion. Neck supple. No tracheal deviation present.  Cardiovascular: Normal rate, regular rhythm and intact distal pulses.   Pulmonary/Chest: Effort normal and breath sounds normal. No respiratory distress.  Abdominal: Soft. Bowel sounds are normal. He exhibits no distension. There is no tenderness. There is no rigidity and no guarding. No hernia. Hernia confirmed negative in the ventral area, confirmed negative in the right inguinal area and confirmed negative in the left  inguinal area.    Musculoskeletal: Normal range of motion. He exhibits no tenderness.       Arms: Lymphadenopathy:    He has no cervical adenopathy.       Right: No inguinal adenopathy present.       Left: No inguinal adenopathy present.  Neurological: He is alert and oriented to person, place, and time. No cranial nerve deficit. He exhibits normal muscle tone. Coordination normal.  Skin: Skin is warm and dry. No rash noted. He is not diaphoretic. No erythema. No pallor.  Psychiatric: He has a normal mood and affect. His behavior is normal. Judgment and thought content normal.       Assessment:     Pleasant but rather deconditioned male now dialysis dependent.  No hard restriction for attempted peritoneal dialysis catheter.     Plan:     I hope that he does not have much adhesions infraumbilically.  Plan laparoscopic exploration through right abdomen.  Placement of peritoneal dialysis catheter.  Hopefully outpatient procedure.  Due on an off dialysis day:  The anatomy & physiology of peritoneum was discussed.  Natural history risks without surgery of worsening renal failure was discussed.   I feel the risks of no intervention will lead to serious problems that outweigh the operative risks; therefore, I recommended placement of a peritoneal dialysis catheter.  I explained laparoscopic techniques with possible need for an open approach.    Risks such as bleeding, infection, abscess, injury to other organs, catheter occlusion or malpositioning, reoperation to remove/reposition the catheter, heart attack, death, and other risks were discussed.   I noted a good likelihood this will help address the problem.  Possibility that this will not be enough to compensate for the renal failure & need for further treatment such as hemodialysis was explained.  Goals of post-operative recovery were discussed as well.  We will work to minimize complications.   The patient is/will be getting training on  catheter use by dialysis nursing before and after surgery.  I stressed the importance of meticulous care & sterile technique to prevent catheter problems.  Questions were answered.  The patient expresses understanding & wishes to proceed with surgery.  Given the severe deconditioning, I recommended pulmonary clearance.  Dr. Melvyn Novas feels that he is a tolerable surgical risk for this.  We will proceed.

## 2013-07-13 ENCOUNTER — Encounter (INDEPENDENT_AMBULATORY_CARE_PROVIDER_SITE_OTHER): Payer: Self-pay

## 2013-07-13 ENCOUNTER — Telehealth (INDEPENDENT_AMBULATORY_CARE_PROVIDER_SITE_OTHER): Payer: Self-pay

## 2013-07-13 NOTE — Telephone Encounter (Signed)
Called pt back to notify him that we did receive pulmonary clearance from Dr Melvyn Novas. The pt understands.

## 2013-07-13 NOTE — Telephone Encounter (Signed)
Message copied by Illene Regulus on Thu Jul 13, 2013 10:24 AM ------      Message from: [place], [place]: Wed Jul 12, 2013  1:54 PM      Contact: [phone]       He would like to know if you got the ok for sugery from his doctor please call them ------

## 2013-07-19 ENCOUNTER — Encounter (INDEPENDENT_AMBULATORY_CARE_PROVIDER_SITE_OTHER): Payer: Self-pay

## 2013-07-23 HISTORY — PX: CARDIAC CATHETERIZATION: SHX172

## 2013-07-25 ENCOUNTER — Encounter: Payer: Self-pay | Admitting: Radiation Oncology

## 2013-07-25 ENCOUNTER — Encounter: Payer: Self-pay | Admitting: *Deleted

## 2013-07-25 ENCOUNTER — Ambulatory Visit
Admission: RE | Admit: 2013-07-25 | Discharge: 2013-07-25 | Disposition: A | Discharge status: Home / Self Care | Payer: Medicare Other | Source: Ambulatory Visit | Attending: Radiation Oncology | Admitting: Radiation Oncology

## 2013-07-25 VITALS — BP 137/70 | HR 79 | Temp 97.3°F | Ht 70.0 in | Wt 180.4 lb

## 2013-07-25 DIAGNOSIS — C320 Malignant neoplasm of glottis: Secondary | ICD-10-CM

## 2013-07-25 NOTE — Progress Notes (Addendum)
Victor Lane here today for reassessment s/p radiation therapy to his larynx/glottis which completed on 03/14/13.  He is ambulating today with a slow gait and with assistance.  He denies any dysphagia/odonyphagia and states he can eat anything, but at times he has an aversion to certain foods.  He also has xerostomia, so suggested rushing teeth with Biotene toothpaste and mouth rinse.  Has gained 2.4 lbs since 07/11/13

## 2013-07-25 NOTE — Progress Notes (Signed)
Radiation Oncology         [phone] ________________________________  Name: Victor Lane MRN: [id number]  Date: 07/25/2013  DOB: [birth year]  Follow-Up Visit Note  CC: [name], MD  [name], MD  Diagnosis and Prior Radiotherapy:   T1bN0M0 Glottic Squamous Cell Carcinoma  Indication for treatment: curative  Radiation treatment dates: 01/24/2013-03/14/2013  Site/dose: Larynx/glottis / 63 Gy in 28 fractions  Narrative:  The patient returns today for routine follow-up.     Overall he is doing well. He denies any dysphagia. His hoarseness is relatively limited now. He does have a dry mouth but this is unrelated to his radiotherapy. He is having a procedure done soon to accommodate peritoneal dialysis so that he can have this performed while he sleeps at home. He is very tired. He sees Dr. Janace Hoard regularly for a laryngoscopy. He had one done at the end of December and will see Dr. Janace Hoard again in March.  No signs of disease on laryngoscopy per patient                       ALLERGIES:  is allergic to amoxicillin and fentanyl.  Meds: Current Outpatient Prescriptions  Medication Sig Dispense Refill  . aspirin 81 MG tablet Take 81 mg by mouth daily.       . calcium acetate (PHOSLO) 667 MG capsule Take 1,334 mg by mouth 3 (three) times daily with meals.      Marland Kitchen esomeprazole (NEXIUM) 40 MG capsule Take 40 mg by mouth daily before breakfast.      . metoprolol tartrate (LOPRESSOR) 25 MG tablet Take 0.5 tablets (12.5 mg total) by mouth 2 (two) times daily.      . multivitamin (RENA-VIT) TABS tablet Take 1 tablet by mouth at bedtime.      . mupirocin ointment (BACTROBAN) 2 %       . Nutritional Supplements (FEEDING SUPPLEMENT, NEPRO CARB STEADY,) LIQD Take 237 mLs by mouth 2 (two) times daily between meals.    0  . traMADol (ULTRAM) 50 MG tablet Take 50 mg by mouth every 4 (four) hours as needed for moderate pain.       Marland Kitchen triamcinolone cream (KENALOG) 0.1 %        No current  facility-administered medications for this encounter.    Physical Findings: The patient is in no acute distress. Patient is alert and oriented.  height is 5\' 10"  (1.778 m) and weight is 180 lb 6.4 oz (81.829 kg). His temperature is 97.3 F (36.3 C). His blood pressure is 137/70 and his pulse is 79. His oxygen saturation is 96%. . Oropharynx, dry mucous membranes. No visible lesions. Neck is used to cervical or supraclavicular lymphadenopathy. Skin intact with no concerning lesions. Laryngoscopy was deferred today due to close followup with otolaryngology  Lab Findings: Lab Results  Component Value Date   WBC 7.6 05/23/2013   HGB 11.6* 05/23/2013   HCT 38.2* 05/23/2013   MCV 97.0 05/23/2013   PLT 140* 05/23/2013   No results found for this basename: TSH   CMP     Component Value Date/Time   NA 133* 05/24/2013 0700   NA 141 03/09/2013 1520   K 4.9 05/24/2013 0700   K 4.8 03/09/2013 1520   CL 91* 05/24/2013 0700   CO2 24 05/24/2013 0700   CO2 30* 03/09/2013 1520   GLUCOSE 106* 05/24/2013 0700   GLUCOSE 110 03/09/2013 1520   BUN 51* 05/24/2013 0700  BUN 26.9* 03/09/2013 1520   CREATININE 6.66* 05/24/2013 0700   CREATININE 5.8 Repeated and Verified* 03/09/2013 1520   CALCIUM 9.0 05/24/2013 0700   CALCIUM 8.8 03/09/2013 1520   PROT 6.7 05/24/2013 0700   ALBUMIN 3.2* 05/24/2013 0700   ALBUMIN 3.3* 05/24/2013 0700   AST 817* 05/24/2013 0700   ALT 1939* 05/24/2013 0700   ALKPHOS 147* 05/24/2013 0700   BILITOT 0.8 05/24/2013 0700   GFRNONAA 8* 05/24/2013 0700   GFRAA 9* 05/24/2013 0700      Radiographic Findings: No results found.  Impression/Plan:    1) Head and Neck Cancer Status: NED  2) Nutritional Status: - PEG tube: removed  3) Risk Factors: The patient has been educated about risk factors including alcohol and tobacco abuse; they understand that avoidance of alcohol and tobacco is important to prevent recurrences as well as other cancers.  Hasn't smoked for about 2 decades.  4)  Swallowing: Good  5) Energy: Due to tiredness, will screen with TSH in near future. He is preoperative labs later this week and we'll see if the TSH to be added to his labs which will be performed at [facility]. If his wife does not hear back from [place] with the lab results next week, she will call us, to make sure that the TSH was successfully added to his [facility] labs.  6) Social: No active social issues to address at this time.   8) Other: Otolaryngology followup is scheduled in March.  9) Follow-up with me in 4 months. The patient was encouraged to call with any issues or questions before then.  I spent 20 minutes minutes face to face with the patient and more than 50% of that time was spent in counseling and/or coordination of care. _____________________________________   Eppie Gibson, MD

## 2013-07-25 NOTE — Progress Notes (Signed)
To provide support and care continuity, met with patient and his wife during scheduled follow-up appt with Dr. Isidore Moos. Continuing to navigate as L3 (treatments completed) patient.  Gayleen Orem, RN, BSN, [facility] Head & Neck Oncology Navigator [phone]

## 2013-07-26 ENCOUNTER — Other Ambulatory Visit (HOSPITAL_COMMUNITY): Payer: Self-pay | Admitting: *Deleted

## 2013-07-26 ENCOUNTER — Other Ambulatory Visit: Payer: Self-pay | Admitting: *Deleted

## 2013-07-26 ENCOUNTER — Other Ambulatory Visit: Payer: Self-pay | Admitting: Radiation Oncology

## 2013-07-26 DIAGNOSIS — R5383 Other fatigue: Secondary | ICD-10-CM

## 2013-07-26 DIAGNOSIS — C320 Malignant neoplasm of glottis: Secondary | ICD-10-CM

## 2013-07-26 DIAGNOSIS — R5381 Other malaise: Secondary | ICD-10-CM

## 2013-07-26 NOTE — Progress Notes (Signed)
[facility] requested that TSH be drawn during PAT visit. Orders placed under requesting MD name.

## 2013-07-27 ENCOUNTER — Encounter (HOSPITAL_COMMUNITY): Payer: Self-pay

## 2013-07-27 ENCOUNTER — Encounter (HOSPITAL_COMMUNITY)
Admission: RE | Admit: 2013-07-27 | Discharge: 2013-07-27 | Disposition: A | Discharge status: Home / Self Care | Payer: Medicare Other | Source: Ambulatory Visit | Attending: Surgery | Admitting: Surgery

## 2013-07-27 ENCOUNTER — Encounter (HOSPITAL_COMMUNITY): Payer: Self-pay | Admitting: Pharmacy Technician

## 2013-07-27 DIAGNOSIS — Z01812 Encounter for preprocedural laboratory examination: Secondary | ICD-10-CM | POA: Insufficient documentation

## 2013-07-27 HISTORY — DX: Polyneuropathy, unspecified: G62.9

## 2013-07-27 HISTORY — DX: Personal history of colonic polyps: Z86.010

## 2013-07-27 HISTORY — DX: Unspecified cataract: H26.9

## 2013-07-27 HISTORY — DX: Personal history of colon polyps, unspecified: Z86.0100

## 2013-07-27 LAB — BASIC METABOLIC PANEL
BUN: 50 mg/dL — AB (ref 6–23)
CO2: 28 mEq/L (ref 19–32)
CREATININE: 5.52 mg/dL — AB (ref 0.50–1.35)
Calcium: 9.5 mg/dL (ref 8.4–10.5)
Chloride: 93 mEq/L — ABNORMAL LOW (ref 96–112)
GFR calc Af Amer: 11 mL/min — ABNORMAL LOW (ref >90)
GFR, EST NON AFRICAN AMERICAN: 10 mL/min — AB (ref >90)
GLUCOSE: 133 mg/dL — AB (ref 70–99)
POTASSIUM: 4.7 meq/L (ref 3.7–5.3)
Sodium: 140 mEq/L (ref 137–147)

## 2013-07-27 LAB — CBC
HCT: 41.2 % (ref 39.0–52.0)
HEMOGLOBIN: 13 g/dL (ref 13.0–17.0)
MCH: 31 pg (ref 26.0–34.0)
MCHC: 31.6 g/dL (ref 30.0–36.0)
MCV: 98.1 fL (ref 78.0–100.0)
Platelets: 159 10*3/uL (ref 150–400)
RBC: 4.2 MIL/uL — ABNORMAL LOW (ref 4.22–5.81)
RDW: 19.1 % — AB (ref 11.5–15.5)
WBC: 8 10*3/uL (ref 4.0–10.5)

## 2013-07-27 LAB — TYPE AND SCREEN
ABO/RH(D): O POS
Antibody Screen: NEGATIVE

## 2013-07-27 LAB — TSH: TSH: 1.234 u[IU]/mL (ref 0.350–4.500)

## 2013-07-27 MED ORDER — CHLORHEXIDINE GLUCONATE 4 % EX LIQD: 1.0000 "application " | Freq: Once | CUTANEOUS | Status: DC

## 2013-07-27 NOTE — Pre-Procedure Instructions (Signed)
Victor Lane Victor Lane  07/27/2013   Your procedure is scheduled on:  Tues, Feb 10 @ 11:00 AM  Report to [facility] Short Stay Entrance A  at 9:00 AM.  Call this number if you have problems the morning of surgery: [phone]   Remember:   Do not eat food or drink liquids after midnight.   Take these medicines the morning of surgery with A SIP OF WATER: Nexium(Esomeprazole),Metoprolol(Lopressor),and Tramadol(Ultram-if needed)               Stop taking your Aspirin. No Goody's,BC's,Aleve,Ibupofen,Fish Oil,or any Herbal Medications   Do not wear jewelry  Do not wear lotions, powders, or  colognes You may wear deodorant.  Men may shave face and neck.  Do not bring valuables to the hospital.  [facility] is not responsible                  for any belongings or valuables.               Contacts, dentures or bridgework may not be worn into surgery.  Leave suitcase in the car. After surgery it may be brought to your room.  For patients admitted to the hospital, discharge time is determined by your                treatment team.               Patients discharged the day of surgery will not be allowed to drive  home.    Special Instructions:  [name] - Preparing for Surgery  Before surgery, you can play an important role.  Because skin is not sterile, your skin needs to be as free of germs as possible.  You can reduce the number of germs on you skin by washing with CHG (chlorahexidine gluconate) soap before surgery.  CHG is an antiseptic cleaner which kills germs and bonds with the skin to continue killing germs even after washing.  Please DO NOT use if you have an allergy to CHG or antibacterial soaps.  If your skin becomes reddened/irritated stop using the CHG and inform your nurse when you arrive at Short Stay.  Do not shave (including legs and underarms) for at least 48 hours prior to the first CHG shower.  You may shave your face.  Please follow these instructions carefully:   1.  Shower  with CHG Soap the night before surgery and the                                morning of Surgery.  2.  If you choose to wash your hair, wash your hair first as usual with your       normal shampoo.  3.  After you shampoo, rinse your hair and body thoroughly to remove the                      Shampoo.  4.  Use CHG as you would any other liquid soap.  You can apply chg directly       to the skin and wash gently with scrungie or a clean washcloth.  5.  Apply the CHG Soap to your body ONLY FROM THE NECK DOWN.        Do not use on open wounds or open sores.  Avoid contact with your eyes,       ears, mouth and genitals (private  parts).  Wash genitals (private parts)       with your normal soap.  6.  Wash thoroughly, paying special attention to the area where your surgery        will be performed.  7.  Thoroughly rinse your body with warm water from the neck down.  8.  DO NOT shower/wash with your normal soap after using and rinsing off       the CHG Soap.  9.  Pat yourself dry with a clean towel.            10.  Wear clean pajamas.            11.  Place clean sheets on your bed the night of your first shower and do not        sleep with pets.  Day of Surgery  Do not apply any lotions/deoderants the morning of surgery.  Please wear clean clothes to the hospital/surgery center.     Please read over the following fact sheets that you were given: Pain Booklet, Coughing and Deep Breathing and Surgical Site Infection Prevention

## 2013-07-27 NOTE — Progress Notes (Addendum)
Dr.Traci Radford Pax is cardiologist with last visit about 62yrs ago and sees as needed to request   Stress test done about 7yrs ago and to be requested from Dr Fransico Him  Denies ever having a heart cath  CXR in epic from 05-2013  EKG in epic from 05-22-13  Echo report in epic from 2014  Pulmonologist is Dr. Melvyn Novas with clearance note from respiratory standpoint-in epic  Medical MD is Dr.Robert Maceo Pro

## 2013-07-28 ENCOUNTER — Encounter (HOSPITAL_COMMUNITY): Payer: Self-pay

## 2013-07-28 ENCOUNTER — Telehealth: Payer: Self-pay | Admitting: Internal Medicine

## 2013-07-28 NOTE — Progress Notes (Signed)
Anesthesia Chart Review:  Patient is a 68 year old male scheduled for laparoscopic exploration of abdomen, placement of peritoneal dialysis catheter by Dr. Johney Maine on 08/01/13.    History includes T1bN0M0 glottic SCC s/p radiation completed 03/14/13, tracheostomy, ESRD currently on HD MWF (has LUE AVF), PEG tube (d/c'd 03/2013), COPD Gold II, home oxygen (changed to nocturnal only 06/29/13), DM2, HTN, carotid artery stenosis s/p left CEA, fibromyalgia, agitation/panic when awakens from anesthesia, former smoker since '97. PCP is Dr. Briscoe Deutscher. Pulmonologist is  aDr. Melvyn Novas who cleared patient from a pulmonary prespective. He saw cardiologist Dr. Fransico Him in 02/2011 for chest pain and had a normal stress test.  EKG on 05/22/13 showed NSR, prolonged QT.  Echo on 04/04/13 showed: - Left ventricle: The cavity size was normal. There was moderate focal basal and mild concentric hypertrophy. Systolic function was normal. Wall motion was normal; there were no regional wall motion abnormalities. Features are consistent with a pseudonormal left ventricular filling pattern, with concomitant abnormal relaxation and increased filling pressure (grade 2 diastolic dysfunction). - Aortic valve: Moderate thickening and calcification, consistent with sclerosis. Moderate regurgitation. - Mitral valve: Mild to moderate regurgitation. - Left atrium: The atrium was mildly dilated. Dr. Radford Pax recommended repeat echo in one year.  Nuclear stress test on 03/18/11 showed normal myocardial perfusion, attenuation artifact in the inferior region, no ischemia or infarct/scar seen in the remaining myocardium, not dated due to rhythm irregularity.  Carotid duplex on 07/08/12 showed patent left CEA, 60-79% RICA stenosis, antegrade vertebral flow.  1V CXR on 05/22/13 showed: Small pleural effusions noted. Minor basilar atelectasis. Heart is enlarged. No definite CHF or focal pneumonia. No pneumothorax. Trachea is midline. Right IJ  dialysis catheter tips in the mid SVC. 2V 04/25/13 showed mild CHF with small pleural effusions.  Preoperative labs noted.  ISTAT on arrival.  Patient tolerated previous procedures in OR within the past year.  He had a recent echo and has pulmonary clearance.  If labs are acceptable and otherwise no acute changes then I would anticipate that he could proceed as planned.  George Hugh [facility] Short Stay Center/Anesthesiology Phone [phone] 07/28/2013 2:53 PM

## 2013-07-28 NOTE — Telephone Encounter (Signed)
Error.  Victor Lane ° °

## 2013-07-29 NOTE — Progress Notes (Signed)
Patient's wife called and asked if it was ok if patient took benadryl at night d/t feeling itchy. Advised her that he could take same but that to let RN know DOS if patient had taken same so that it could be added to medication list.

## 2013-07-31 MED ORDER — CLINDAMYCIN PHOSPHATE 900 MG/50ML IV SOLN
900.0000 mg | INTRAVENOUS | Status: AC
  Administered 2013-08-01: 900 mg via INTRAVENOUS
  Filled 2013-07-31: qty 50

## 2013-07-31 MED ORDER — GENTAMICIN IN SALINE 1-0.9 MG/ML-% IV SOLN
100.0000 mg | INTRAVENOUS | Status: AC
  Administered 2013-08-01: 100 mg via INTRAVENOUS
  Filled 2013-07-31: qty 100

## 2013-08-01 ENCOUNTER — Ambulatory Visit (HOSPITAL_COMMUNITY): Payer: Medicare Other | Admitting: Anesthesiology

## 2013-08-01 ENCOUNTER — Ambulatory Visit (HOSPITAL_COMMUNITY): Payer: Medicare Other

## 2013-08-01 ENCOUNTER — Encounter (HOSPITAL_COMMUNITY)
Admission: RE | Disposition: A | Discharge status: Home / Self Care | Payer: Self-pay | Source: Ambulatory Visit | Attending: Surgery

## 2013-08-01 ENCOUNTER — Encounter (HOSPITAL_COMMUNITY): Payer: Self-pay | Admitting: Certified Registered"

## 2013-08-01 ENCOUNTER — Inpatient Hospital Stay (HOSPITAL_COMMUNITY)
Admission: RE | Admit: 2013-08-01 | Discharge: 2013-08-02 | DRG: 673 | Disposition: A | Discharge status: Home / Self Care | Payer: Medicare Other | Source: Ambulatory Visit | Attending: Surgery | Admitting: Surgery

## 2013-08-01 ENCOUNTER — Encounter (HOSPITAL_COMMUNITY): Payer: Medicare Other | Admitting: Vascular Surgery

## 2013-08-01 DIAGNOSIS — Z923 Personal history of irradiation: Secondary | ICD-10-CM

## 2013-08-01 DIAGNOSIS — M6208 Separation of muscle (nontraumatic), other site: Secondary | ICD-10-CM

## 2013-08-01 DIAGNOSIS — G629 Polyneuropathy, unspecified: Secondary | ICD-10-CM

## 2013-08-01 DIAGNOSIS — I6529 Occlusion and stenosis of unspecified carotid artery: Secondary | ICD-10-CM

## 2013-08-01 DIAGNOSIS — Z930 Tracheostomy status: Secondary | ICD-10-CM

## 2013-08-01 DIAGNOSIS — Z8521 Personal history of malignant neoplasm of larynx: Secondary | ICD-10-CM

## 2013-08-01 DIAGNOSIS — M545 Low back pain, unspecified: Secondary | ICD-10-CM

## 2013-08-01 DIAGNOSIS — IMO0001 Reserved for inherently not codable concepts without codable children: Secondary | ICD-10-CM | POA: Diagnosis present

## 2013-08-01 DIAGNOSIS — I10 Essential (primary) hypertension: Secondary | ICD-10-CM

## 2013-08-01 DIAGNOSIS — E7800 Pure hypercholesterolemia, unspecified: Secondary | ICD-10-CM | POA: Diagnosis present

## 2013-08-01 DIAGNOSIS — J9610 Chronic respiratory failure, unspecified whether with hypoxia or hypercapnia: Secondary | ICD-10-CM

## 2013-08-01 DIAGNOSIS — Z9089 Acquired absence of other organs: Secondary | ICD-10-CM

## 2013-08-01 DIAGNOSIS — I120 Hypertensive chronic kidney disease with stage 5 chronic kidney disease or end stage renal disease: Principal | ICD-10-CM | POA: Diagnosis present

## 2013-08-01 DIAGNOSIS — K219 Gastro-esophageal reflux disease without esophagitis: Secondary | ICD-10-CM | POA: Diagnosis present

## 2013-08-01 DIAGNOSIS — R740 Nonspecific elevation of levels of transaminase and lactic acid dehydrogenase [LDH]: Secondary | ICD-10-CM

## 2013-08-01 DIAGNOSIS — J449 Chronic obstructive pulmonary disease, unspecified: Secondary | ICD-10-CM

## 2013-08-01 DIAGNOSIS — D631 Anemia in chronic kidney disease: Secondary | ICD-10-CM | POA: Diagnosis present

## 2013-08-01 DIAGNOSIS — E559 Vitamin D deficiency, unspecified: Secondary | ICD-10-CM | POA: Diagnosis present

## 2013-08-01 DIAGNOSIS — J9600 Acute respiratory failure, unspecified whether with hypoxia or hypercapnia: Secondary | ICD-10-CM

## 2013-08-01 DIAGNOSIS — D638 Anemia in other chronic diseases classified elsewhere: Secondary | ICD-10-CM

## 2013-08-01 DIAGNOSIS — R29898 Other symptoms and signs involving the musculoskeletal system: Secondary | ICD-10-CM

## 2013-08-01 DIAGNOSIS — R7401 Elevation of levels of liver transaminase levels: Secondary | ICD-10-CM

## 2013-08-01 DIAGNOSIS — N186 End stage renal disease: Secondary | ICD-10-CM

## 2013-08-01 DIAGNOSIS — K660 Peritoneal adhesions (postprocedural) (postinfection): Secondary | ICD-10-CM | POA: Diagnosis present

## 2013-08-01 DIAGNOSIS — I252 Old myocardial infarction: Secondary | ICD-10-CM

## 2013-08-01 DIAGNOSIS — Z87891 Personal history of nicotine dependence: Secondary | ICD-10-CM

## 2013-08-01 DIAGNOSIS — Z992 Dependence on renal dialysis: Secondary | ICD-10-CM

## 2013-08-01 DIAGNOSIS — Z888 Allergy status to other drugs, medicaments and biological substances status: Secondary | ICD-10-CM

## 2013-08-01 DIAGNOSIS — E873 Alkalosis: Secondary | ICD-10-CM | POA: Diagnosis not present

## 2013-08-01 DIAGNOSIS — J4489 Other specified chronic obstructive pulmonary disease: Secondary | ICD-10-CM

## 2013-08-01 DIAGNOSIS — Z881 Allergy status to other antibiotic agents status: Secondary | ICD-10-CM

## 2013-08-01 DIAGNOSIS — M79609 Pain in unspecified limb: Secondary | ICD-10-CM

## 2013-08-01 DIAGNOSIS — R1310 Dysphagia, unspecified: Secondary | ICD-10-CM

## 2013-08-01 DIAGNOSIS — H9190 Unspecified hearing loss, unspecified ear: Secondary | ICD-10-CM | POA: Diagnosis present

## 2013-08-01 DIAGNOSIS — R4182 Altered mental status, unspecified: Secondary | ICD-10-CM

## 2013-08-01 DIAGNOSIS — J9620 Acute and chronic respiratory failure, unspecified whether with hypoxia or hypercapnia: Secondary | ICD-10-CM | POA: Diagnosis not present

## 2013-08-01 DIAGNOSIS — K759 Inflammatory liver disease, unspecified: Secondary | ICD-10-CM

## 2013-08-01 DIAGNOSIS — E875 Hyperkalemia: Secondary | ICD-10-CM

## 2013-08-01 DIAGNOSIS — E119 Type 2 diabetes mellitus without complications: Secondary | ICD-10-CM | POA: Diagnosis present

## 2013-08-01 DIAGNOSIS — R5383 Other fatigue: Secondary | ICD-10-CM

## 2013-08-01 DIAGNOSIS — M109 Gout, unspecified: Secondary | ICD-10-CM | POA: Diagnosis present

## 2013-08-01 DIAGNOSIS — G4730 Sleep apnea, unspecified: Secondary | ICD-10-CM | POA: Diagnosis present

## 2013-08-01 DIAGNOSIS — Z860100 Personal history of colon polyps, unspecified: Secondary | ICD-10-CM

## 2013-08-01 DIAGNOSIS — G609 Hereditary and idiopathic neuropathy, unspecified: Secondary | ICD-10-CM | POA: Diagnosis present

## 2013-08-01 DIAGNOSIS — R5381 Other malaise: Secondary | ICD-10-CM | POA: Diagnosis present

## 2013-08-01 DIAGNOSIS — C320 Malignant neoplasm of glottis: Secondary | ICD-10-CM

## 2013-08-01 DIAGNOSIS — J811 Chronic pulmonary edema: Secondary | ICD-10-CM | POA: Diagnosis present

## 2013-08-01 DIAGNOSIS — N039 Chronic nephritic syndrome with unspecified morphologic changes: Secondary | ICD-10-CM

## 2013-08-01 DIAGNOSIS — E43 Unspecified severe protein-calorie malnutrition: Secondary | ICD-10-CM

## 2013-08-01 DIAGNOSIS — J989 Respiratory disorder, unspecified: Secondary | ICD-10-CM | POA: Diagnosis present

## 2013-08-01 HISTORY — PX: CAPD INSERTION: SHX5233

## 2013-08-01 HISTORY — PX: LAPAROSCOPIC ABDOMINAL EXPLORATION: SHX6249

## 2013-08-01 LAB — POCT I-STAT 3, ART BLOOD GAS (G3+)
Acid-Base Excess: 4 mmol/L — ABNORMAL HIGH (ref 0.0–2.0)
BICARBONATE: 28.4 meq/L — AB (ref 20.0–24.0)
O2 Saturation: 95 %
PH ART: 7.445 (ref 7.350–7.450)
TCO2: 30 mmol/L (ref 0–100)
pCO2 arterial: 41.2 mmHg (ref 35.0–45.0)
pO2, Arterial: 74 mmHg — ABNORMAL LOW (ref 80.0–100.0)

## 2013-08-01 LAB — POCT I-STAT 4, (NA,K, GLUC, HGB,HCT)
Glucose, Bld: 94 mg/dL (ref 70–99)
HEMATOCRIT: 47 % (ref 39.0–52.0)
Hemoglobin: 16 g/dL (ref 13.0–17.0)
Potassium: 4.1 mEq/L (ref 3.7–5.3)
Sodium: 138 mEq/L (ref 137–147)

## 2013-08-01 LAB — MRSA PCR SCREENING: MRSA by PCR: NEGATIVE

## 2013-08-01 LAB — GLUCOSE, CAPILLARY
GLUCOSE-CAPILLARY: 58 mg/dL — AB (ref 70–99)
Glucose-Capillary: 116 mg/dL — ABNORMAL HIGH (ref 70–99)
Glucose-Capillary: 66 mg/dL — ABNORMAL LOW (ref 70–99)
Glucose-Capillary: 101 mg/dL — ABNORMAL HIGH (ref 70–99)

## 2013-08-01 SURGERY — LAPAROSCOPIC INSERTION CONTINUOUS AMBULATORY PERITONEAL DIALYSIS  (CAPD) CATHETER
Anesthesia: General | Site: Abdomen

## 2013-08-01 MED ORDER — FENTANYL CITRATE 0.05 MG/ML IJ SOLN
INTRAMUSCULAR | Status: AC
  Filled 2013-08-01: qty 5

## 2013-08-01 MED ORDER — ACETAMINOPHEN 650 MG RE SUPP
650.0000 mg | RECTAL | Status: DC | PRN
Start: 2013-08-01 — End: 2013-08-02
  Filled 2013-08-01: qty 1

## 2013-08-01 MED ORDER — MAGIC MOUTHWASH
15.0000 mL | Freq: Four times a day (QID) | ORAL | Status: DC | PRN
  Filled 2013-08-01: qty 15

## 2013-08-01 MED ORDER — BUPIVACAINE-EPINEPHRINE (PF) 0.25% -1:200000 IJ SOLN
INTRAMUSCULAR | Status: AC
  Filled 2013-08-01: qty 30

## 2013-08-01 MED ORDER — OXYCODONE HCL 5 MG PO TABS: 5.0000 mg | ORAL_TABLET | ORAL | Status: DC | PRN

## 2013-08-01 MED ORDER — ACETAMINOPHEN 325 MG PO TABS: 325.0000 mg | ORAL_TABLET | ORAL | Status: DC | PRN

## 2013-08-01 MED ORDER — ALUM & MAG HYDROXIDE-SIMETH 200-200-20 MG/5ML PO SUSP
30.0000 mL | Freq: Four times a day (QID) | ORAL | Status: DC | PRN
  Filled 2013-08-01: qty 30

## 2013-08-01 MED ORDER — 0.9 % SODIUM CHLORIDE (POUR BTL) OPTIME
TOPICAL | Status: DC | PRN
  Administered 2013-08-01: 1000 mL

## 2013-08-01 MED ORDER — SODIUM CHLORIDE 0.9 % IJ SOLN
3.0000 mL | Freq: Two times a day (BID) | INTRAMUSCULAR | Status: DC
  Administered 2013-08-01 – 2013-08-02 (×2): 3 mL via INTRAVENOUS

## 2013-08-01 MED ORDER — ONDANSETRON HCL 4 MG/2ML IJ SOLN: 4.0000 mg | Freq: Once | INTRAMUSCULAR | Status: DC | PRN

## 2013-08-01 MED ORDER — ONDANSETRON HCL 4 MG/2ML IJ SOLN
4.0000 mg | Freq: Four times a day (QID) | INTRAMUSCULAR | Status: DC | PRN
  Filled 2013-08-01: qty 2

## 2013-08-01 MED ORDER — PHENYLEPHRINE 40 MCG/ML (10ML) SYRINGE FOR IV PUSH (FOR BLOOD PRESSURE SUPPORT)
PREFILLED_SYRINGE | INTRAVENOUS | Status: AC
  Filled 2013-08-01: qty 10

## 2013-08-01 MED ORDER — METOPROLOL TARTRATE 1 MG/ML IV SOLN: 5.0000 mg | Freq: Four times a day (QID) | INTRAVENOUS | Status: DC | PRN

## 2013-08-01 MED ORDER — DEXTROSE 50 % IV SOLN
INTRAVENOUS | Status: AC
  Administered 2013-08-01: 50 mL
  Filled 2013-08-01: qty 50

## 2013-08-01 MED ORDER — TRAMADOL HCL 50 MG PO TABS: 50.0000 mg | ORAL_TABLET | Freq: Four times a day (QID) | ORAL | Status: DC | PRN

## 2013-08-01 MED ORDER — HEPARIN SODIUM (PORCINE) 5000 UNIT/ML IJ SOLN
INTRAMUSCULAR | Status: DC | PRN
  Administered 2013-08-01: 10:00:00

## 2013-08-01 MED ORDER — PROPOFOL 10 MG/ML IV EMUL
5.0000 ug/kg/min | INTRAVENOUS | Status: DC
  Administered 2013-08-01: 16 ug/kg/min via INTRAVENOUS
  Administered 2013-08-01 – 2013-08-02 (×2): 40 ug/kg/min via INTRAVENOUS
  Filled 2013-08-01 (×4): qty 100

## 2013-08-01 MED ORDER — GLYCOPYRROLATE 0.2 MG/ML IJ SOLN
INTRAMUSCULAR | Status: AC
  Filled 2013-08-01: qty 3

## 2013-08-01 MED ORDER — BLISTEX MEDICATED EX OINT
TOPICAL_OINTMENT | Freq: Two times a day (BID) | CUTANEOUS | Status: DC
Start: 2013-08-01 — End: 2013-08-02
  Administered 2013-08-01 – 2013-08-02 (×2): via TOPICAL
  Filled 2013-08-01: qty 10

## 2013-08-01 MED ORDER — SODIUM CHLORIDE 0.9 % IR SOLN
Status: DC | PRN
  Administered 2013-08-01: 1000 mL

## 2013-08-01 MED ORDER — ACETAMINOPHEN 160 MG/5ML PO SOLN
325.0000 mg | ORAL | Status: DC | PRN
  Filled 2013-08-01: qty 20.3

## 2013-08-01 MED ORDER — LIDOCAINE HCL (CARDIAC) 20 MG/ML IV SOLN
INTRAVENOUS | Status: DC | PRN
  Administered 2013-08-01: 70 mg via INTRAVENOUS

## 2013-08-01 MED ORDER — SODIUM CHLORIDE 0.9 % IV SOLN
INTRAVENOUS | Status: DC
  Administered 2013-08-01: 10:00:00 via INTRAVENOUS

## 2013-08-01 MED ORDER — SODIUM CHLORIDE 0.9 % IJ SOLN
INTRAMUSCULAR | Status: AC
  Filled 2013-08-01: qty 10

## 2013-08-01 MED ORDER — ACETAMINOPHEN 325 MG PO TABS
650.0000 mg | ORAL_TABLET | ORAL | Status: DC | PRN
  Filled 2013-08-01: qty 2

## 2013-08-01 MED ORDER — PROPOFOL 10 MG/ML IV BOLUS
INTRAVENOUS | Status: DC | PRN
  Administered 2013-08-01: 200 mg via INTRAVENOUS

## 2013-08-01 MED ORDER — PHENYLEPHRINE HCL 10 MG/ML IJ SOLN
INTRAMUSCULAR | Status: DC | PRN
  Administered 2013-08-01: 120 ug via INTRAVENOUS

## 2013-08-01 MED ORDER — NEOSTIGMINE METHYLSULFATE 1 MG/ML IJ SOLN
INTRAMUSCULAR | Status: DC | PRN
  Administered 2013-08-01: 5 mg via INTRAVENOUS

## 2013-08-01 MED ORDER — LIDOCAINE HCL (CARDIAC) 20 MG/ML IV SOLN
INTRAVENOUS | Status: AC
  Filled 2013-08-01: qty 5

## 2013-08-01 MED ORDER — FENTANYL CITRATE 0.05 MG/ML IJ SOLN
INTRAMUSCULAR | Status: DC | PRN
  Administered 2013-08-01: 50 ug via INTRAVENOUS

## 2013-08-01 MED ORDER — NEOSTIGMINE METHYLSULFATE 1 MG/ML IJ SOLN
INTRAMUSCULAR | Status: AC
  Filled 2013-08-01: qty 10

## 2013-08-01 MED ORDER — DIPHENHYDRAMINE HCL 25 MG PO TABS
50.0000 mg | ORAL_TABLET | Freq: Four times a day (QID) | ORAL | Status: DC | PRN
  Filled 2013-08-01: qty 2

## 2013-08-01 MED ORDER — SODIUM CHLORIDE 0.9 % IV SOLN: 250.0000 mL | INTRAVENOUS | Status: DC | PRN

## 2013-08-01 MED ORDER — PROPOFOL 10 MG/ML IV EMUL
5.0000 ug/kg/min | INTRAVENOUS | Status: DC
  Administered 2013-08-01: 10 ug/kg/min via INTRAVENOUS

## 2013-08-01 MED ORDER — FENTANYL CITRATE 0.05 MG/ML IJ SOLN
25.0000 ug | INTRAMUSCULAR | Status: DC | PRN
  Administered 2013-08-01: 100 ug via INTRAVENOUS
  Filled 2013-08-01: qty 2

## 2013-08-01 MED ORDER — EPHEDRINE SULFATE 50 MG/ML IJ SOLN
INTRAMUSCULAR | Status: AC
  Filled 2013-08-01: qty 1

## 2013-08-01 MED ORDER — ONDANSETRON HCL 4 MG/2ML IJ SOLN
INTRAMUSCULAR | Status: AC
  Filled 2013-08-01: qty 2

## 2013-08-01 MED ORDER — PANTOPRAZOLE SODIUM 40 MG PO TBEC
80.0000 mg | DELAYED_RELEASE_TABLET | Freq: Every day | ORAL | Status: DC
Start: 2013-08-02 — End: 2013-08-02

## 2013-08-01 MED ORDER — PROPOFOL 10 MG/ML IV BOLUS
INTRAVENOUS | Status: AC
  Filled 2013-08-01: qty 20

## 2013-08-01 MED ORDER — PHENYLEPHRINE HCL 10 MG/ML IJ SOLN
10.0000 mg | INTRAVENOUS | Status: DC | PRN
  Administered 2013-08-01: 20 ug/min via INTRAVENOUS

## 2013-08-01 MED ORDER — ARTIFICIAL TEARS OP OINT
TOPICAL_OINTMENT | OPHTHALMIC | Status: DC | PRN
  Administered 2013-08-01: 1 via OPHTHALMIC

## 2013-08-01 MED ORDER — CHLORHEXIDINE GLUCONATE 0.12 % MT SOLN
15.0000 mL | Freq: Two times a day (BID) | OROMUCOSAL | Status: DC
  Administered 2013-08-01 – 2013-08-02 (×2): 15 mL via OROMUCOSAL
  Filled 2013-08-01: qty 15

## 2013-08-01 MED ORDER — MORPHINE SULFATE 2 MG/ML IJ SOLN: 1.0000 mg | INTRAMUSCULAR | Status: DC | PRN

## 2013-08-01 MED ORDER — ACETAMINOPHEN 500 MG PO TABS
1000.0000 mg | ORAL_TABLET | Freq: Three times a day (TID) | ORAL | Status: AC
  Filled 2013-08-01 (×3): qty 2

## 2013-08-01 MED ORDER — GLYCOPYRROLATE 0.2 MG/ML IJ SOLN
INTRAMUSCULAR | Status: DC | PRN
  Administered 2013-08-01: 0.6 mg via INTRAVENOUS

## 2013-08-01 MED ORDER — ARTIFICIAL TEARS OP OINT
TOPICAL_OINTMENT | OPHTHALMIC | Status: AC
  Filled 2013-08-01: qty 3.5

## 2013-08-01 MED ORDER — SODIUM CHLORIDE 0.9 % IJ SOLN
3.0000 mL | INTRAMUSCULAR | Status: DC | PRN
Start: 2013-08-01 — End: 2013-08-02

## 2013-08-01 MED ORDER — ONDANSETRON HCL 4 MG/2ML IJ SOLN
INTRAMUSCULAR | Status: DC | PRN
  Administered 2013-08-01: 4 mg via INTRAVENOUS

## 2013-08-01 MED ORDER — FENTANYL CITRATE 0.05 MG/ML IJ SOLN
25.0000 ug | INTRAMUSCULAR | Status: DC | PRN
Start: 2013-08-01 — End: 2013-08-01
  Administered 2013-08-01: 25 ug via INTRAVENOUS
  Administered 2013-08-01: 50 ug via INTRAVENOUS
  Administered 2013-08-01: 25 ug via INTRAVENOUS

## 2013-08-01 MED ORDER — FENTANYL CITRATE 0.05 MG/ML IJ SOLN
INTRAMUSCULAR | Status: AC
  Filled 2013-08-01: qty 2

## 2013-08-01 MED ORDER — SODIUM CHLORIDE 0.9 % IJ SOLN: 3.0000 mL | INTRAMUSCULAR | Status: DC | PRN

## 2013-08-01 MED ORDER — FUROSEMIDE 10 MG/ML IJ SOLN
40.0000 mg | Freq: Three times a day (TID) | INTRAMUSCULAR | Status: AC
  Administered 2013-08-01 – 2013-08-02 (×2): 40 mg via INTRAVENOUS
  Filled 2013-08-01: qty 4

## 2013-08-01 MED ORDER — METOPROLOL TARTRATE 12.5 MG HALF TABLET
12.5000 mg | ORAL_TABLET | Freq: Two times a day (BID) | ORAL | Status: DC
  Filled 2013-08-01 (×3): qty 1

## 2013-08-01 MED ORDER — ROCURONIUM BROMIDE 50 MG/5ML IV SOLN
INTRAVENOUS | Status: AC
  Filled 2013-08-01: qty 1

## 2013-08-01 MED ORDER — LIP MEDEX EX OINT
1.0000 "application " | TOPICAL_OINTMENT | Freq: Two times a day (BID) | CUTANEOUS | Status: DC
  Filled 2013-08-01: qty 7

## 2013-08-01 MED ORDER — BUPIVACAINE-EPINEPHRINE 0.25% -1:200000 IJ SOLN
INTRAMUSCULAR | Status: DC | PRN
  Administered 2013-08-01: 15 mL

## 2013-08-01 MED ORDER — ROCURONIUM BROMIDE 100 MG/10ML IV SOLN
INTRAVENOUS | Status: DC | PRN
  Administered 2013-08-01: 50 mg via INTRAVENOUS

## 2013-08-01 SURGICAL SUPPLY — 67 items
ADAPTER CATH SAFE LCK II CO PK (MISCELLANEOUS) ×2 IMPLANT
ADAPTER SAFE LOCK II CO PACK (MISCELLANEOUS) ×2
ADPR DLYS CATH INSRT WRNCH TY (MISCELLANEOUS) ×2
APPLIER CLIP 5 13 M/L LIGAMAX5 (MISCELLANEOUS)
APPLIER CLIP ROT 10 11.4 M/L (STAPLE)
APR CLP MED LRG 11.4X10 (STAPLE)
APR CLP MED LRG 5 ANG JAW (MISCELLANEOUS)
BAG DECANTER FOR FLEXI CONT (MISCELLANEOUS) ×4 IMPLANT
BLADE SURG ROTATE 9660 (MISCELLANEOUS) ×3 IMPLANT
CANISTER SUCTION 2500CC (MISCELLANEOUS) ×3 IMPLANT
CATH MONCRIEF POPOVICH (CATHETERS) IMPLANT
CHLORAPREP W/TINT 26ML (MISCELLANEOUS) ×4 IMPLANT
CLIP APPLIE 5 13 M/L LIGAMAX5 (MISCELLANEOUS) IMPLANT
CLIP APPLIE ROT 10 11.4 M/L (STAPLE) IMPLANT
COIL SWAN NECK LT (MISCELLANEOUS) ×3 IMPLANT
COIL SWAN NECK RT (MISCELLANEOUS) IMPLANT
COVER SURGICAL LIGHT HANDLE (MISCELLANEOUS) ×4 IMPLANT
DECANTER SPIKE VIAL GLASS SM (MISCELLANEOUS) ×1 IMPLANT
DISSECTOR BLUNT TIP ENDO 5MM (MISCELLANEOUS) IMPLANT
DRAPE WARM FLUID 44X44 (DRAPE) ×4 IMPLANT
DRSG OPSITE 4X5.5 SM (GAUZE/BANDAGES/DRESSINGS) IMPLANT
DRSG PAD ABDOMINAL 8X10 ST (GAUZE/BANDAGES/DRESSINGS) IMPLANT
DRSG TEGADERM 2-3/8X2-3/4 SM (GAUZE/BANDAGES/DRESSINGS) ×9 IMPLANT
DRSG TEGADERM 4X4.75 (GAUZE/BANDAGES/DRESSINGS) ×7 IMPLANT
ELECT REM PT RETURN 9FT ADLT (ELECTROSURGICAL) ×4
ELECTRODE REM PT RTRN 9FT ADLT (ELECTROSURGICAL) ×2 IMPLANT
GAUZE SPONGE 2X2 8PLY STRL LF (GAUZE/BANDAGES/DRESSINGS) ×2 IMPLANT
GLOVE BIO SURGEON STRL SZ7 (GLOVE) ×3 IMPLANT
GLOVE BIO SURGEON STRL SZ7.5 (GLOVE) ×3 IMPLANT
GLOVE BIOGEL PI IND STRL 6.5 (GLOVE) ×1 IMPLANT
GLOVE BIOGEL PI IND STRL 7.0 (GLOVE) ×4 IMPLANT
GLOVE BIOGEL PI IND STRL 7.5 (GLOVE) ×1 IMPLANT
GLOVE BIOGEL PI IND STRL 8 (GLOVE) ×2 IMPLANT
GLOVE BIOGEL PI INDICATOR 6.5 (GLOVE) ×2
GLOVE BIOGEL PI INDICATOR 7.0 (GLOVE) ×4
GLOVE BIOGEL PI INDICATOR 7.5 (GLOVE) ×2
GLOVE BIOGEL PI INDICATOR 8 (GLOVE) ×2
GLOVE ECLIPSE 8.0 STRL XLNG CF (GLOVE) ×4 IMPLANT
GLOVE SS BIOGEL STRL SZ 7 (GLOVE) ×1 IMPLANT
GLOVE SUPERSENSE BIOGEL SZ 7 (GLOVE) ×2
GOWN STRL NON-REIN LRG LVL3 (GOWN DISPOSABLE) ×11 IMPLANT
GOWN STRL REIN XL XLG (GOWN DISPOSABLE) ×4 IMPLANT
KIT BASIN OR (CUSTOM PROCEDURE TRAY) ×4 IMPLANT
KIT ROOM TURNOVER OR (KITS) ×4 IMPLANT
NEEDLE 22X1 1/2 (OR ONLY) (NEEDLE) ×4 IMPLANT
NS IRRIG 1000ML POUR BTL (IV SOLUTION) ×5 IMPLANT
PAD ARMBOARD 7.5X6 YLW CONV (MISCELLANEOUS) ×8 IMPLANT
SCALPEL HARMONIC ACE (MISCELLANEOUS) IMPLANT
SCISSORS LAP 5X35 DISP (ENDOMECHANICALS) ×3 IMPLANT
SET EXTENSION TUBING 8  CATH (SET/KITS/TRAYS/PACK) ×4 IMPLANT
SET IRRIG TUBING LAPAROSCOPIC (IRRIGATION / IRRIGATOR) ×3 IMPLANT
SLEEVE ENDOPATH XCEL 5M (ENDOMECHANICALS) ×4 IMPLANT
SPONGE GAUZE 2X2 STER 10/PKG (GAUZE/BANDAGES/DRESSINGS) ×2
SUT MNCRL AB 4-0 PS2 18 (SUTURE) ×4 IMPLANT
SUT PROLENE 2 0 CT2 30 (SUTURE) ×6 IMPLANT
SUT VIC AB 3-0 SH 27 (SUTURE)
SUT VIC AB 3-0 SH 27XBRD (SUTURE) IMPLANT
TOWEL OR 17X24 6PK STRL BLUE (TOWEL DISPOSABLE) ×1 IMPLANT
TOWEL OR 17X26 10 PK STRL BLUE (TOWEL DISPOSABLE) ×4 IMPLANT
TRAY FOLEY CATH 14FRSI W/METER (CATHETERS) IMPLANT
TRAY FOLEY CATH 16FR SILVER (SET/KITS/TRAYS/PACK) IMPLANT
TRAY LAPAROSCOPIC (CUSTOM PROCEDURE TRAY) ×4 IMPLANT
TROCAR FALLER TUNNELING (TROCAR) ×4 IMPLANT
TROCAR XCEL BLUNT TIP 100MML (ENDOMECHANICALS) ×1 IMPLANT
TROCAR XCEL NON-BLD 11X100MML (ENDOMECHANICALS) ×4 IMPLANT
TROCAR XCEL NON-BLD 5MMX100MML (ENDOMECHANICALS) ×5 IMPLANT
WATER STERILE IRR 1000ML POUR (IV SOLUTION) IMPLANT

## 2013-08-01 NOTE — Anesthesia Preprocedure Evaluation (Addendum)
Anesthesia Evaluation  Patient identified by MRN, date of birth, ID band Patient awake    Reviewed: Allergy & Precautions, H&P , NPO status , Patient's Chart, lab work & pertinent test results  History of Anesthesia Complications Negative for: history of anesthetic complications  Airway Mallampati: III TM Distance: >3 FB Neck ROM: Full    Dental  (+) Teeth Intact, Partial Upper and Dental Advisory Given   Pulmonary sleep apnea and Oxygen sleep apnea , COPDformer smoker,    Pulmonary exam normal       Cardiovascular hypertension, Pt. on medications - angina+ Past MI and + Peripheral Vascular Disease - CAD and - CHF Rhythm:Regular Rate:Normal     Neuro/Psych negative neurological ROS  negative psych ROS   GI/Hepatic GERD-  Medicated and Controlled,(+) Hepatitis -  Endo/Other  diabetes, Type 2, Oral Hypoglycemic Agents  Renal/GU CRF and DialysisRenal disease  negative genitourinary   Musculoskeletal   Abdominal   Peds  Hematology negative hematology ROS (+)   Anesthesia Other Findings   Reproductive/Obstetrics                        Anesthesia Physical Anesthesia Plan  ASA: III  Anesthesia Plan: General   Post-op Pain Management:    Induction: Intravenous  Airway Management Planned: Oral ETT and LMA  Additional Equipment: None  Intra-op Plan:   Post-operative Plan: Extubation in OR  Informed Consent: I have reviewed the patients History and Physical, chart, labs and discussed the procedure including the risks, benefits and alternatives for the proposed anesthesia with the patient or authorized representative who has indicated his/her understanding and acceptance.   Dental advisory given  Plan Discussed with: CRNA and Surgeon  Anesthesia Plan Comments:         Anesthesia Quick Evaluation

## 2013-08-01 NOTE — Progress Notes (Signed)
Respiratory therapy note-Diprovan is off and has been for 15 min, vent settings changed to simv and attempted to wean, however, low VE. Cont to monitor for extubation.

## 2013-08-01 NOTE — Interval H&P Note (Signed)
History and Physical Interval Note:  08/01/2013 9:37 AM  Victor Lane  has presented today for surgery, with the diagnosis of END STAGE RENAL DISEASE   The various methods of treatment have been discussed with the patient and family. After consideration of risks, benefits and other options for treatment, the patient has consented to  Procedure(s): LAPAROSCOPIC INSERTION CONTINUOUS AMBULATORY PERITONEAL DIALYSIS  (CAPD) CATHETER (N/A) LAPAROSCOPIC LYSIS OF ADHESIONS (N/A) LAPAROSCOPIC INGUINAL HERNIA (N/A) (possible) as a surgical intervention .  The patient's history has been reviewed, patient examined, no change in status, stable for surgery.  I have reviewed the patient's chart and labs.  Questions were answered to the patient's satisfaction.     Lisa Blakeman C.

## 2013-08-01 NOTE — OR Nursing (Signed)
Dr. Johney Maine updated to patient's course after tx to PACU and reintubation. Will call to update MD again later after patient able to extubate and assess ability to d/c home vs overnite observation.

## 2013-08-01 NOTE — Progress Notes (Signed)
Providing lunch relief

## 2013-08-01 NOTE — Transfer of Care (Signed)
Immediate Anesthesia Transfer of Care Note  Patient: Victor Lane  Procedure(s) Performed: Procedure(s): LAPAROSCOPIC INSERTION CONTINUOUS AMBULATORY PERITONEAL DIALYSIS CATHETER (N/A) LAPAROSCOPIC ABDOMINAL EXPLORATION (N/A)  Patient Location: PACU  Anesthesia Type:General  Level of Consciousness: patient cooperative, lethargic and responds to stimulation  Airway & Oxygen Therapy: Patient Spontanous Breathing and non-rebreather face mask  Post-op Assessment: Report given to PACU RN and Patient moving all extremities X 4  Post vital signs: Reviewed and stable  Complications: No apparent anesthesia complications

## 2013-08-01 NOTE — Care Management (Signed)
PACU Note I talked to Dr. Johney Maine about the patient and need for continued post op ventilation since the patient did not meet extubation criteria. I informed him of the need for CCM and the call was put in. Nurse Bailey Mech talked to the staff and all agreed to transfer to ICU with CCM management. At the time CXR ordered and labs. Finis Bud MD Anesthesia Attending

## 2013-08-01 NOTE — Op Note (Signed)
08/01/2013  11:31 AM  PATIENT:  Victor Lane  68 y.o. male  Patient Care Team: [name], MD as PCP - General (Family Medicine) Sueanne Margarita, MD (Cardiology) Brooks Sailors, RN as Registered Nurse (Oncology) Tanda Rockers, MD as Consulting Physician (Pulmonary Disease) Lucrezia Starch, MD as Consulting Physician (Nephrology)  PRE-OPERATIVE DIAGNOSIS:  END STAGE RENAL DISEASE   POST-OPERATIVE DIAGNOSIS:  END STAGE RENAL DISEASE   PROCEDURE:  Procedure(s): LAPAROSCOPIC INSERTION CONTINUOUS AMBULATORY PERITONEAL DIALYSIS CATHETER LAPAROSCOPIC ABDOMINAL EXPLORATION  SURGEON:  Surgeon(s): [name], MD  ASSISTANT: RN   ANESTHESIA:   local and general  EBL:     Delay start of Pharmacological VTE agent (>24hrs) due to surgical blood loss or risk of bleeding:  no  DRAINS: CAPD catheter   SPECIMEN:  No Specimen  DISPOSITION OF SPECIMEN:  N/A  COUNTS:  YES  PLAN OF CARE: Discharge to home after PACU  PATIENT DISPOSITION:  PACU - hemodynamically stable.  INDICATION: Pt with ESRD requesting CAPD  The anatomy & physiology of peritoneum was discussed.  Natural history risks without surgery of worsening renal failure was discussed.   I feel the risks of no intervention will lead to serious problems that outweigh the operative risks; therefore, I recommended placement of a peritoneal dialysis catheter.  I explained laparoscopic techniques with possible need for an open approach.    Risks such as bleeding, infection, abscess, injury to other organs, catheter occlusion or malpositioning, reoperation to remove/reposition the catheter, heart attack, death, and other risks were discussed.   I noted a good likelihood this will help address the problem.  Possibility that this will not be enough to compensate for the renal failure & need for further treatment such as hemodialysis was explained.  Goals of post-operative recovery were discussed as well.  We will work to  minimize complications.   The patient is/will be getting training on catheter use by dialysis nursing before and after surgery.  I stressed the importance of meticulous care & sterile technique to prevent catheter problems.  Questions were answered.  The patient expresses understanding & wishes to proceed with surgery.   OR FINDINGS:   It is a [place] curl cath 62.5cm tunneled CAPD peritoneal dialysis catheter.  The curl tip of the catheter rests in the deep pelvis.   The exit site of the catheter on the skin is in the left upper abdomen, 7 cm inferior to costal ridge, mid clavicular line.  DESCRIPTION:   Informed consent was confirmed.  The patient underwent general anaesthesia without difficulty.  The patient was positioned appropriately.  VTE prevention in place.  The patient's abdomen was clipped, prepped, & draped in a sterile fashion.  Surgical timeout confirmed our plan.  The patient was positioned in reverse Trendelenburg.  Abdominal entry was gained using optical entry technique in the right upper abdomen.  Entry was clean.  I induced carbon dioxide insufflation.  Camera inspection revealed no injury.  Extra ports were carefully placed under direct laparoscopic visualization.   I saw no hernias.  I mobilized the greater omentum it into the upper abdomen.  Omentum was adherent to the periumbilical peritoneum, performing a functional omentopexy by adhesions.  This kept the omentum from reaching into the infraumbilical abdomen. I freed off 2 thin bands between the stomach and omentum in the LUQ, but left the rest alone.  I proceeded with placement of the peritoneal dialysis catheter.   I tunneled a 5mm dilating port obliquely  through the abdominal wall from a left periumbilical paramedian skin incision inferiorly, splitting the left infraumbililcal rectus muscle to exit into the peritoneum just cephalad to the dome of the bladder.  I placed the CAPD catheter through that port.  The curl of  the CAPD catheter rested down in the cul de sac of the deep pelvis.   The deep cuff rested in the pre-peritoneal space.  The Port was removed to allow the external end to exit the skin of the port site.  I used a spike-tip tunneler to redirect the catheter superiorly out towards the left lateral clavicular line 7 cm inferior to the costal ridge in the LUQ.  Cuff just in the SQ.     Hemostasis was excellent.  The catheter flushed and aspirated easily with heparinized saline into the CAPD catheter.  I removed the ports.  I closed the skin using 4-0 monocryl stitch.  Sterile dressings were applied. The patient was extubated & arrived in the PACU in stable condition..  I had discussed postoperative care with the patient in the holding area. I am about to locate the patient's family and discuss operative findings and postoperative goals / instructions.  Instructions are written in the chart as well.  The patient will require close followup for flushing/management of a peritoneal dialysis catheter through [place]

## 2013-08-01 NOTE — Anesthesia Postprocedure Evaluation (Signed)
  Anesthesia Post-op Note  Patient: Victor Lane  Procedure(s) Performed: Procedure(s): LAPAROSCOPIC INSERTION CONTINUOUS AMBULATORY PERITONEAL DIALYSIS CATHETER (N/A) LAPAROSCOPIC ABDOMINAL EXPLORATION (N/A)  Patient Location: PACU  Anesthesia Type:General  Level of Consciousness: responds to stimulation  Airway and Oxygen Therapy: Patient re-intubated  Post-op Pain: none  Post-op Assessment: Post-op Vital signs reviewed, Patient's Cardiovascular Status Stable, RESPIRATORY FUNCTION UNSTABLE and Pain level controlled  Post-op Vital Signs: Reviewed and stable  Complications: Patient re-intubated for weakness following lap surgery with paralytic and reversal. Upon arrival to PACU patient being bag ventilated and with poor motor tone. BG wnl. Decision to re intubate and wean ventilatory support once improved motor tone

## 2013-08-01 NOTE — Preoperative (Signed)
Beta Blockers   Reason not to administer Beta Blockers:Not Applicable 

## 2013-08-01 NOTE — Progress Notes (Signed)
Pt. Fell this a.m., out of bed, small laceration on bridge of nose & upper lip swollen & abrasion present.

## 2013-08-01 NOTE — Consult Note (Signed)
Name: Victor Lane MRN: [id number] DOB: [birth year]    ADMISSION DATE:  08/01/2013 CONSULTATION DATE:  08/01/2013  REFERRING MD :  Dr. Johney Maine PRIMARY SERVICE: [facility]  CHIEF COMPLAINT:  Respiratory Failure  BRIEF PATIENT DESCRIPTION: 68 year old with extensive PMH including severe cardiovascular disease and vocal cord cancer presenting to the PCCM from PACU after having surgery to place peritoneal dialysis catheter.  Patient was extubated in the OR then reintubated in PACU due to inability to protect airway and respiratory failure.  PCCM was consulted for vent management and assistance with extubation.  SIGNIFICANT EVENTS / STUDIES:  VDRF post op 2/10>>>  LINES / TUBES: ET tube 2/10>>> PIV>>>  CULTURES: None  ANTIBIOTICS: Surgical prophylaxis only.  PAST MEDICAL HISTORY :  Past Medical History  Diagnosis Date  . Chest pain     non-ischemic stress test 02/2011 (Dr. Radford Pax)  . Carotid artery occlusion   . Neuropathy   . Arthritis   . H/O vitamin D deficiency   . Hearing loss   . Hypercholesteremia   . 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 S/P radiation therapy 01/24/2013-03/14/2013    Larynx/glottis / 63 Gy in 28 fractions  . Hypertension     takes Metoprolol daily  . GERD (gastroesophageal reflux disease)     takes Nexium daily  . Complication of anesthesia     hard to wake up and then panics waking up  . Family history of anesthesia complication     Adopted  . Sleep apnea     to request from Dr.Fried was done about 51yrs ago but no cpap d/t weight loss  . Pneumonia 01/2013  . Weakness     over whole body  . Fibromyalgia     takes tramadol daily as needed  . Myalgia and myositis   . Peripheral neuropathy     was on Lyrica but has been off x a week(Was only on for 3days)  . History of gout   . Rash     both lower legs and feet and using Mupirocin  . History of colon polyps   . Chronic kidney disease       - Stage 5- follwed by Dr Sharlotte Alamo in [place]  . Diabetes mellitus     takes Metformin when needed d/t weight loss  . Cataracts, bilateral     immature   Past Surgical History  Procedure Laterality Date  . Cholecystectomy  1993  . Rotator cuff repair  07/06/2011    right  . Carotid endarterectomy Left   . Coolonoscopy    . Colonoscopy w/ biopsies and polypectomy      Hx: of  . Microlaryngoscopy with laser N/A 01/04/2013    Procedure: MICROLARYNGOSCOPY WITH BIOPSY/LASER;  Surgeon: [name], MD;  Location: [facility];  Service: ENT;  Laterality: N/A;  . Av fistula placement Left 02/23/2013    Procedure: ARTERIOVENOUS (AV) FISTULA CREATION;  Surgeon: Conrad [place] Creek, MD;  Location: [facility];  Service: Vascular;  Laterality: Left;   Prior to Admission medications   Medication Sig Start Date End Date Taking? Authorizing Provider  aspirin 81 MG tablet Take 81 mg by mouth daily.    Yes Historical Provider, MD  calcium acetate (PHOSLO) 667 MG capsule Take 1,334 mg by mouth 3 (three) times daily with meals.   Yes Historical Provider, MD  diphenhydrAMINE (BENADRYL) 25 MG tablet Take 50 mg by  mouth every 6 (six) hours as needed.   Yes Historical Provider, MD  esomeprazole (NEXIUM) 40 MG capsule Take 40 mg by mouth daily before breakfast.   Yes Historical Provider, MD  glipiZIDE (GLUCOTROL XL) 2.5 MG 24 hr tablet Take 2.5 mg by mouth daily as needed (diabetes).   Yes Historical Provider, MD  metoprolol tartrate (LOPRESSOR) 25 MG tablet Take 0.5 tablets (12.5 mg total) by mouth 2 (two) times daily. 02/25/13  Yes Tammy S Parrett, NP  multivitamin (RENA-VIT) TABS tablet Take 1 tablet by mouth daily.    Yes Historical Provider, MD  Nutritional Supplements (FEEDING SUPPLEMENT, NEPRO CARB STEADY,) LIQD Take 237 mLs by mouth 2 (two) times daily between meals. 05/24/13  Yes Barton Dubois, MD  triamcinolone cream (KENALOG) 0.1 % Apply 1 application topically 2 (two) times daily. Apply to knee and lower leg.  07/18/13  Yes Historical Provider, MD  traMADol (ULTRAM) 50 MG tablet Take 1-2 tablets (50-100 mg total) by mouth every 6 (six) hours as needed for moderate pain or severe pain. 08/01/13   [name], MD   Allergies  Allergen Reactions  . Amoxicillin     Causes upset stomach  . Fentanyl     Confusion when patch applied    FAMILY HISTORY:  Family History  Problem Relation Age of Onset  . Adopted: Yes   SOCIAL HISTORY:  reports that he has quit smoking. His smoking use included Cigarettes. He has a 62 pack-year smoking history. He has never used smokeless tobacco. He reports that he does not drink alcohol or use illicit drugs.  REVIEW OF SYSTEMS:  Unattainable, patient is sedated and intubated.  SUBJECTIVE: Sedated and intubated.  VITAL SIGNS: Temp:  [97.6 F (36.4 C)-97.8 F (36.6 C)] 97.6 F (36.4 C) (02/10 1145) Pulse Rate:  [65-101] 68 (02/10 1615) Resp:  [12-25] 16 (02/10 1615) BP: (95-155)/(35-72) 134/57 mmHg (02/10 1615) SpO2:  [67 %-100 %] 100 % (02/10 1615) FiO2 (%):  [40 %-100 %] 40 % (02/10 1605) HEMODYNAMICS:   VENTILATOR SETTINGS: Vent Mode:  [-] SIMV;PSV FiO2 (%):  [40 %-100 %] 40 % Set Rate:  [10 bmp-16 bmp] 12 bmp Vt Set:  [580 mL-750 mL] 750 mL PEEP:  [5 cmH20] 5 cmH20 Pressure Support:  [10 cmH20] 10 cmH20 Plateau Pressure:  [17 cmH20-18 cmH20] 18 cmH20 INTAKE / OUTPUT: Intake/Output     02/09 0701 - 02/10 0700 02/10 0701 - 02/11 0700   I.V.  80   Total Intake   80   Net   +80          PHYSICAL EXAMINATION: General:  Chronically ill appearing male, older than stated age. Neuro:  Sedated and intubated, moves all ext to pain, not following commands, pupils reactive. HEENT:  [place]/AT, PERRL, EOM-I and MMM. Cardiovascular:  RRR, Nl S1/S2, -M/R/G. Lungs:  Coarse BS diffusely. Abdomen:  Soft, diffusely tender, ND and +BS. Musculoskeletal:  -edema and -tenderness. Skin:  Intact.  Surgical site clean.  LABS:  CBC  Recent Labs Lab  07/27/13 1534 08/01/13 0943  WBC 8.0  --   HGB 13.0 16.0  HCT 41.2 47.0  PLT 159  --    Coag's No results found for this basename: APTT, INR,  in the last 168 hours BMET  Recent Labs Lab 07/27/13 1534 08/01/13 0943  NA 140 138  K 4.7 4.1  CL 93*  --   CO2 28  --   BUN 50*  --   CREATININE 5.52*  --  GLUCOSE 133* 94   Electrolytes  Recent Labs Lab 07/27/13 1534  CALCIUM 9.5   Sepsis Markers No results found for this basename: LATICACIDVEN, PROCALCITON, O2SATVEN,  in the last 168 hours ABG No results found for this basename: PHART, PCO2ART, PO2ART,  in the last 168 hours Liver Enzymes No results found for this basename: AST, ALT, ALKPHOS, BILITOT, ALBUMIN,  in the last 168 hours Cardiac Enzymes No results found for this basename: TROPONINI, PROBNP,  in the last 168 hours Glucose  Recent Labs Lab 08/01/13 1213  GLUCAP 116*    Imaging Dg Chest Port 1 View  08/01/2013   CLINICAL DATA:  Status post re-intubation  EXAM: PORTABLE CHEST - 1 VIEW  COMPARISON:  05/22/2013  FINDINGS: An endotracheal tube is seen 3.5 cm above the carinal. The cardiac shadow is prominent. Vascular congestion with mild posteriorly layering effusions is identified. A temporary dialysis catheter is not seen on this film. Correlation with the clinical findings is recommended.  IMPRESSION: Vascular congestion and effusions which may be related to volume overload.  Endotracheal tube as described.   Electronically Signed   By: Inez Catalina M.D.   On: 08/01/2013 16:22   CXR: Pre-op CXR with pulmonary edema and pleural effusion consistent with volume overload.  ASSESSMENT / PLAN:  PULMONARY A: VDRF post PD catheter placement since patient was unable to wake up. P:   - Full vent support overnight. - ABG and CXR now and in AM. - SBT and will likely extubate in AM.  CARDIOVASCULAR A: Extensive history as above.  Hemodynamically stable for now. P:  - Propofol seems to be enough to keep BP in  range. - KVO IVF.  RENAL A:  Chronic renal failure, PD catheter placed.  No Cr drawn pre-op. P:   - BMET now and in AM. - Lasix 40 mg IV x2 doses for pulmonary edema. - KVO IVF.  GASTROINTESTINAL A:  No active issues. P:   - NPO as anticipate will extubate in AM.  HEMATOLOGIC A:  Hg ok. P:  - CBC now and in AM.  INFECTIOUS A:  No active issues. P:   - Monitor for fever. - Gent/clinda for prophylaxis pre-op. - No abx for now.  ENDOCRINE A:  No DM by history.   P:   - Monitor.  NEUROLOGIC A:  Sedated post op, evidently difficult to wake up in previous anesthesia situations. P:   - Propofol drip. - Fentanyl pushes for pain. - Minimize sedation as able. - Hold sedation in AM for extubation.  TODAY'S SUMMARY: 68 year old male with extensive cardiovascular history, renal failure, in for PD catheter placement.  Failed extubation post-op.  Admit to the ICU, keep sedate overnight with propofol and will SBT to hopefully extubate in AM.  I have personally obtained a history, examined the patient, evaluated laboratory and imaging results, formulated the assessment and plan and placed orders.  CRITICAL CARE: The patient is critically ill with multiple organ systems failure and requires high complexity decision making for assessment and support, frequent evaluation and titration of therapies, application of advanced monitoring technologies and extensive interpretation of multiple databases. Critical Care Time devoted to patient care services described in this note is 45 minutes.   Rush Farmer, M.D. [facility] Pulmonary/Critical Care Medicine. Pager: [phone]. After hours pager: [phone].

## 2013-08-01 NOTE — Anesthesia Procedure Notes (Signed)
Procedure Name: Intubation Date/Time: 08/01/2013 10:37 AM Performed by: Barrington Ellison Pre-anesthesia Checklist: Patient identified, Emergency Drugs available, Suction available, Patient being monitored and Timeout performed Patient Re-evaluated:Patient Re-evaluated prior to inductionOxygen Delivery Method: Circle system utilized Preoxygenation: Pre-oxygenation with 100% oxygen Intubation Type: IV induction Ventilation: Mask ventilation without difficulty Laryngoscope Size: Mac and 4 Grade View: Grade II Tube type: Oral Tube size: 7.5 mm Number of attempts: 1 Airway Equipment and Method: Stylet Placement Confirmation: ETT inserted through vocal cords under direct vision,  positive ETCO2 and breath sounds checked- equal and bilateral Secured at: 23 cm Tube secured with: Tape Dental Injury: Teeth and Oropharynx as per pre-operative assessment

## 2013-08-01 NOTE — Discharge Instructions (Signed)
PERITONEAL DIALYSIS (CAPD) CATHETER PLACEMENT:  POST OPERATIVE INSTRUCTIONS  1. DIET: Follow a light bland diet the first 24 hours after arrival home, such as soup, liquids, crackers, etc.  Be sure to include lots of fluids daily.  Avoid fast food or heavy meals as your are more likely to get nauseated.   2. Take your usually prescribed home medications unless otherwise directed. 3. PAIN CONTROL: a. Pain is best controlled by a usual combination of three different methods TOGETHER: i. Ice/Heat ii. Tylenol (over the counter pain medication) iii. Prescription pain medication b. Most patients will experience some swelling and bruising around the incisions.  Ice packs or heating pads (30-60 minutes up to 6 times a day) will help. Use ice for the first few days to help decrease swelling and bruising, then switch to heat to help relax tight/sore spots and speed recovery.  Some people prefer to use ice alone, heat alone, alternating between ice & heat.  Experiment to what works for you.  Swelling and bruising can take several weeks to resolve.   c. It is helpful to take an over-the-counter pain medication regularly for the first few weeks.  Using acetaminophen (Tylenol, etc) 500-650mg  four times a day (every meal & bedtime) is usually safest since NSAIDs are not advisable in patients with kidney disease. d. A  prescription for pain medication (such as oxycodone, hydrocodone, etc) should be given to you upon discharge.  Take your pain medication as prescribed.  i. If you are having problems/concerns with the prescription medicine (does not control pain, nausea, vomiting, rash, itching, etc), please call us [phone] to see if we need to switch you to a different pain medicine that will work better for you and/or control your side effect better. ii. If you need a refill on your pain medication, please contact your pharmacy.  They will contact our office to request authorization. Prescriptions will not be  filled after 5 pm or on week-ends. 4. Avoid getting constipated.  Between the surgery and the pain medications, it is common to experience some constipation.  Increasing fluid intake and taking a fiber supplement (such as Metamucil, Citrucel, FiberCon, MiraLax, etc) 1-2 times a day regularly will usually help prevent this problem from occurring.  A mild laxative (prune juice, Milk of Magnesia, MiraLax, etc) should be taken according to package directions if there are no bowel movements after 48 hours.   5. Wash / shower every day.  You may shower over the dressings as they are waterproof.  Continue to shower over incision(s) after the dressing is off. 6. The Peritoneal Dialysis nurse will remove your waterproof bandages in the Dialysis Center a few days after surgery.  Do not remove the bandages until seen by them. 7. ACTIVITIES as tolerated:   a. You may resume regular (light) daily activities beginning the next day--such as daily self-care, walking, climbing stairs--gradually increasing activities as tolerated.  If you can walk 30 minutes without difficulty, it is safe to try more intense activity such as jogging, treadmill, bicycling, low-impact aerobics, swimming, etc. b. Save the most intensive and strenuous activity for last such as sit-ups, heavy lifting, contact sports, etc  Refrain from any heavy lifting or straining until you are off narcotics for pain control.   c. DO NOT PUSH THROUGH PAIN.  Let pain be your guide: If it hurts to do something, don't do it.  Pain is your body warning you to avoid that activity for another week until the pain goes  down. d. You may drive when you are no longer taking prescription pain medication, you can comfortably wear a seatbelt, and you can safely maneuver your car and apply brakes. e. Dennis Bast may have sexual intercourse when it is comfortable.  FOLLOW UP with the Peritoneal Dialysis nurses closely after surgery.  Call [phone] to help arrange  training/flushes of cathete    -The CAPD nurses & Nephrology usually follow you closely, making the need for follow-up in our office redundant and therefore not needed.  If they or you have concerns, please call us for possible follow-up in our office  -Please call [facility] at [phone] only as needed.  WHEN TO CALL us [phone]: 1. Poor pain control 2. Reactions / problems with new medications (rash/itching, nausea, etc)  3. Fever over 101.5 F (38.5 C) 4. Worsening swelling or bruising 5. Continued bleeding from incision. 6. Increased pain, redness, or drainage from the incision   The clinic staff is available to answer your questions during regular business hours (8:30am-5pm).  Please dont hesitate to call and ask to speak to one of our nurses for clinical concerns.   If you have a medical emergency, go to the nearest emergency room or call 911.  A surgeon from [facility] is always on call at the hospitals  9. IF YOU HAVE DISABILITY OR FAMILY LEAVE FORMS, BRING THEM TO THE OFFICE FOR PROCESSING.  DO NOT GIVE THEM TO YOUR DOCTOR.  [facility], [address], [place], [place], [place]  [address] ? MAIN: [phone] ? TOLL FREE: [phone] ?  FAX [phone] [email or web address]  Peritoneal Dialysis - An Overview Dialysis can be done using a machine outside of the body (hemodialysis). Or, it can be done inside the body (peritoneal dialysis). The word "peritoneal" refers to the lining or membrane of the belly (abdominal cavity). The peritoneal membrane is a thin, plastic-like lining inside the belly that covers the organs and fits in the abdominal or peritoneal cavity, such as the stomach, liver and the kidneys. This lining works like a filter. It will allow certain things to pass from your blood through the lining and into a special solution that has been placed into your belly. In this type of dialysis, the peritoneum is used to  help clean the blood.  If you need dialysis, your kidneys are not working right. Healthy kidneys take out extra water and waste products, which becomes urine. When the kidneys do not do this, serious problems can develop. The waste and water build up in the blood. Your hands and feet might swell. You may feel tired, weak or sick to your stomach. Also, your blood pressure may rise. If not treated, you could die. Dialysis is a treatment that does the work that your kidneys would do if they were healthy.  It cleans your blood.   It will make sure your body has the right amount of certain chemicals that it needs. They include potassium, sodium and bicarbonate.   It will help control your blood pressure.  UNDERSTANDING PERITONEAL DIALYSIS  Here is how peritoneal dialysis works:   First, you will have surgery to put a soft plastic tube (catheter) into your belly (abdomen). This will allow you to easily connect yourself to special tubing, which will then let a special dialysis solution to be placed into your abdomen.   For each treatment, you will need at least one bag of dialysis solution (a liquid called dialysate). It is  a mix of water that is pure and free of germs (sterile), sugar (dextrose) and the nutrients and minerals found in your blood. Sometimes, more than one bag is needed to get the right amount of fluid for your abdomen. Your caregiver will explain what size and how many bags you will need.   The dialysate is slowly put through the catheter to fill the abdomen (called the peritoneal cavity). This dialysate will need to stay in your body for 3-4 hours. This is known as the dwell time.   The solution is working to clean the blood and remove wastes from your body. At the end of this time, the solution is drained from your body through tubing into an empty bag. It is then replaced with a fresh dialysate.   The draining and replacing of the dialysate is called an exchange or cycle. The  catheter is capped after each exchange. Once the solution is in your body, you are then free to do whatever you would like until the next exchange. Most people will need to do 4-5 exchanges each day.   There are two different methods that can be used.   Continuous ambulatory peritoneal dialysis (CAPD): You put the solution into your abdomen, cap your catheter and then go about your day. Several hours later, you reconnect to a tubing set up, drain out the solution and then put more solution in. This is done several times a day. No machine is needed.   Continuous cycler-assisted peritoneal dialysis (CCPD): A machine is used, which fills the abdomen with dialysate and then drains it. This happens several times. It usually is done at night while you are sleeping. When you wake up, you can disconnect from the machine and are free to go to go about your day.  PREPARING FOR EXCHANGES  Discuss the details of the procedure with your caregivers. You will be working with a nurse who is specially trained in doing dialysis. Make sure you understand:   How to do an exchange.   How much solution you need.   What type of solution you will need.   How often you should do an exchange. Ask:   How many times each day?   When? At meals? At bedtime?   Always keep the dialysate bags and other supplies in a cool, clean and dry place.   Keeping everything clean is very important.   The catheter and its cap must be free from germs (sterile)   The adapter also must be sterile. It attaches the dialysis bag and tubing to the catheter.   Clean the area of your body around the catheter every day. Use a chemical that fights infection (antiseptic).   Wash your hands thoroughly before starting an exchange.   You may be taught to wear a mask to cover your nose and mouth. This makes infection less likely to happen.   You may be taught to close doors, windows and turn off any fans before doing an exchange.   Check  the dialysate bag very carefully.   Make sure it is the right size bag for you. This information is on the label.   Also, make sure it is the right mixture. For some people, the dialysate contents vary. For instance, the mixture might be a stronger solution for overnight.   Check the expiration date (the last date you can use the bag). It also is on the label. If the date has gone by, throw away the bag.  The solution should be clear. You should be able to see any writing on the side of the bag clearly through the solution. Do not use a cloudy solution.   Gently squeeze the bag to make sure there are no leaks.   Use a dry heating pad to warm the dialysate in the bag. Leave the cover on the bag while you do this.   This is for comfort. You can skip this step if you want.   Never place the bag of solution under warm or hot water. Water from a faucet is not sterile and could cause germs to get into the bag. Infection could then result.  PERFORMING AN EXCHANGE  For continuous ambulatory dialysis:   Attach the dialysis bag and tubing to your catheter. Hang the bag so that gravity (the natural downward pull) draws the solution down and into your abdomen once the clamps are opened. This should take about 10 minutes.   Remove the bag and tubing from the catheter. Cap the catheter.   The solution stays in the abdomen for 3-4 hours (dwell time). The solution is working to clean the blood and remove wastes from your body.   When you are ready to drain the solution for another exchange, take the cap off the catheter. Then, attach the catheter to tubing, which is attached to an empty bag. Place this empty bag below the abdomen or on the floor or stool and undo the clamps.   Gravity helps pull the fluid out of the abdomen and into the bag. The fluid in the bag may look yellow and clear, like urine. It usually takes about 20 minutes to drain the fluid out of the abdomen.   When the solution has  drained, start the process again by infusing a new bag of dialysate and then capping the catheter.   This should continue until you have used all of the solution that you are to use each day.   Sometimes, a small machine is used overnight. It is called a mini-cycler. This is done if the body cannot go all night without an exchange. The machine lets you sleep without having to get up and do an exchange.   For continuous cycler-assisted dialysis:   You will be taught how to set up or program your machine.   When you are ready for bed, put the dialysate bags onto the cycler machine. Put on exactly the number of bags that your caregiver said to use.   Connect your catheter to the machine and turn the cycler machine on.   Overnight, the cycler will do several exchanges. It often does three to five, sometimes more.   Solution that is in your abdomen in the morning will stay during the day. The machine is set to make the daytime solution stronger, if that is needed.   In the morning, you will disconnect from the machine and cap your catheter and go about your day.   Sometimes, an extra exchange is done during the day. This may be needed to remove excess waste or fluid.  IMPORTANT REMINDERS  You will need to follow a very strict schedule. Every step of the dialysis procedure must be done every day. Sometimes, several times a day. Altogether, this might take an extra 2 hours or more. However, you must stick to the routine. Do not skip a day. Do not skip a procedure.   Some people find it helpful to work with a Social worker or Education officer, museum in  addition to the renal (kidney) nurse. They can help you figure out how to change your daily routine to fit in the dialysis sessions.   You may need to change your diet. Ask your caregiver for advice, or talk with a nutritionist about what you should and should not eat.   You will need to weigh yourself every day and keep track of what your weight is.   You  may be taught how to check your blood pressure before every exchange. Your blood pressure reading will help determine what type of solution to use. If your blood pressure is too high, you may need a stronger solution.  RISKS AND COMPLICATIONS  Possible problems vary, depending on the method you use. Your overall health also can have an effect. Problems that could develop because of dialysis include:  Infection. This is the most common problem. It could occur:   In the peritoneum. This is called peritonitis.   Around the catheter.   Weight gain. The dialysate contains a type of sugar known as dextrose. Dextrose has a lot of calories. The body takes in several hundred calories from this sugar each day.   Weakened muscles in the abdomen. This can result from all of the fluid that your body has to hold in the abdomen.   Catheter replacement. Sometimes, a new one has to be put in.   Change in dialysis method. Due to some complications, you may need to change to hemodialysis for a short time and have your dialysis done at a center.   Trouble adjusting to your new lifestyle. In some people, this leads to depression.   Sleep problems.   Dialysis-related amyloidosis. This sometimes occurs after 5 years of dialysis. Protein builds up in the blood. This can cause painful deposits on bones, joints and tendons (which connect muscle to bone). Or, it can cause hollow spots in bones that make them more likely to break.   Excess fluid. Your body may absorb too much of the fluid that is held in the abdomen. This can lead to heart or lung problems.  SEEK MEDICAL CARE IF:   You have any problems with an exchange.   The area around the catheter becomes red or painful.   The catheter seems loose, or it feels like it is coming out.   A bag of dialysate looks cloudy. Or, the liquid is an unusual color.   Abdominal pain or discomfort.   You feel sick to your stomach (nauseous) or throw up (vomit).    You develop a fever of more than 102 F (38.9 C).  SEEK IMMEDIATE MEDICAL CARE IF:  You develop a fever of more than 102 F (38.9 C). Document Released: 04/05/2009 Document Revised: 05/28/2011 Document Reviewed: 04/05/2009 [facility] Patient Information 2012 [place].  Diet for Peritoneal Dialysis This diet may be modified in protein, sodium, phosphorus, potassium, or fluid, depending on your needs. The goals of nutrition therapy are similar to those for patients on hemodialysis. Providing enough protein to replace peritoneal losses is a priority. USES OF THIS DIET The diet is designed for the patient with end-stage kidney (renal) disease, who is treated by peritoneal dialysis. Treatment options include:  Continuous Ambulatory Peritoneal Dialysis (CAPD): Usually 4 exchanges of 1.5 to 2 liter volumes of glucose (sugar) and electrolyte-containing dialysate.   Continuous Cyclic Peritoneal Dialysis (CCPD): Essentially a reversal of CAPD, with shorter exchanges at night and a longer one during the day.   Intermittent Peritoneal Dialysis (IPD): 10 to  12 hours of exchanges, 2 to 3 times weekly.  °ADEQUACY °The diet may not meet the Recommended Dietary Allowances of the National Research Council for calcium and ascorbic acid. Protein and water-soluble vitamin needs may be increased because of losses into the dialysate. Recommended daily supplements are the same as for hemodialysis patients. °ASSESSMENT/DETERMINATION OF DIET °Dietary needs will differ between patients. Parameters must be individualized. °Protein °· Guidelines: 1.2 to 1.3 gm/kg/day OR 1.5 gm/kg/day if patient is malnourished, catabolic, or has a protracted episode of peritonitis. A minimum of 50% of the protein intake should be of high biological value.  °· Goals: Meet protein requirements and replace dialysate losses while avoiding excessive accumulation of waste products. Achieve serum albumin greater than 3.5 g/dL.  °· Evaluate:  Current nutritional status, serum albumin and BUN levels, presence of peritonitis.  °Sodium °· Guidelines: Usually 90 to 175 mEq (2000 to 4000 mg), but should be individualized.  °· Goals: Minimize complications of fluid imbalance.  °· Evaluate: Weight, blood pressure regulation, and presence of swelling (edema).  °Potassium °· Guidelines: Individualized; often not restricted, and may need to be supplemented.  °· Goals: Serum K+ levels between 4.0 to 5.0 mEq/L.  °· Evaluate: Serum K+ levels, usual intake of K+, appetite.  °Phosphorus °· Guidelines: 800 to 1200 mg/day (the high protein intake results in a high obligatory P intake).  °· Goal: Serum P levels between 4.5 to 6.0 mg/dL.  °· Evaluate: Serum P levels, usual P intake, P-binding medications: type, number, dosage, distribution.  °Fluids °· Guidelines: Individualized - may not be restricted for all patients.  °· Goal: Minimize complications of fluid imbalance.  °· Evaluate: Weight, blood pressure regulation, sodium intake, and presence of edema.  °Document Released: 06/08/2005 Document Revised: 05/28/2011 Document Reviewed: 08/31/2006 °[employer]® Patient Information ©2012 [employer]. ° °

## 2013-08-01 NOTE — H&P (View-Only) (Signed)
Subjective:     Patient ID: Victor Lane, male   DOB: 1946-04-22, 68 y.o.   MRN: [id number]  HPI  Note: This dictation was prepared with Dragon/digital dictation along with [employer]. Any transcriptional errors that result from this process are unintentional.       SHOWN DISSINGER  January 21, 1946 [id number]  Patient Care Team: [name], MD as PCP - General (Family Medicine) Sueanne Margarita, MD (Cardiology) Brooks Sailors, RN as Registered Nurse (Oncology) Tanda Rockers, MD as Consulting Physician (Pulmonary Disease) Lucrezia Starch, MD as Consulting Physician (Nephrology)  This patient is a 68 y.o.male who presents today for surgical evaluation at the request of Dr. Lorrene Reid.   Reason for visit: Dialysis-dependent.  Request for peritoneal dialysis.  Pleasant male.   Comes today with his wife.  Became severely sick in the hospital in a coma.  Multisystem organ failure.  Tracheostomy.  Renal failure.  Has gradually been recovering.  However, he has persistent renal failure.  Undergoing hemodialysis.  Fair tolerance to hemodialysis.  Wish to consider peritoneal dialysis.  Makes one half-one cup of urine a day.  He gets hemodialysis Monday/Wednesday/Friday through a left upper arm AV fistula.  He was severely deconditioned.  Gradually able to do more.  Can walk about 10 minutes before he has to stop.  Has a bowel movement one-2 times a day.  Had cholecystectomy in the past.  Had a stomach feeding tube placed in the left upper quadrant.  That was removed in October.  No problems since.  He used to smoke but quit almost 20 years ago.  No history of MRSA or infections.  Patient Active Problem List   Diagnosis Date Noted  . Diastasis recti 07/11/2013  . COPD GOLD II 06/29/2013  . Hepatitis 05/22/2013  . Chronic respiratory failure 04/25/2013  . Leg weakness, bilateral 04/19/2013  . Pain in limb 04/19/2013  . Neuropathy 04/19/2013  . Lumbago 04/19/2013  . Other malaise  and fatigue 04/19/2013  . End stage renal disease 03/31/2013  . Tracheostomy status 02/23/2013  . Dysphagia 02/16/2013  . Aspiration pneumonia 02/16/2013  . Protein-calorie malnutrition, severe 02/12/2013  . Acute respiratory failure 02/10/2013  . Altered mental status 02/10/2013  . Diabetes mellitus 02/10/2013  . HTN (hypertension) 02/10/2013  . Hyperkalemia 02/10/2013  . ESRD (end stage renal disease) on dialysis 02/10/2013  . Anemia of chronic disease 02/10/2013  . Transaminitis 02/10/2013  . High anion gap metabolic acidosis 67/89/3810  . 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 Occlusion and stenosis of carotid artery without mention of cerebral infarction 03/24/2011    Past Medical History  Diagnosis Date  . Diabetes mellitus   . Hypertension   . GERD (gastroesophageal reflux disease)   . Fibromyalgia   . Chest pain   . Carotid artery occlusion   . Chronic kidney disease     - Stage 5- follwed by Dr Justin Mend  . Complication of anesthesia     agiation when he awaken  . Neuropathy   . Arthritis   . Sleep apnea     not on CPAP  . H/O vitamin D deficiency   . Hearing loss   . Hypercholesteremia   . Myalgia and myositis   . 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 S/P radiation therapy 01/24/2013-03/14/2013    Larynx/glottis / 63 Gy in 28 fractions    Past Surgical History  Procedure Laterality  Date  . Cholecystectomy  1993  . Rotator cuff repair  07/06/2011    right  . Carotid endarterectomy Left   . Coolonoscopy    . Colonoscopy w/ biopsies and polypectomy      Hx: of  . Microlaryngoscopy with laser N/A 01/04/2013    Procedure: MICROLARYNGOSCOPY WITH BIOPSY/LASER;  Surgeon: [name], MD;  Location: [facility];  Service: ENT;  Laterality: N/A;  . Av fistula placement Left 02/23/2013    Procedure: ARTERIOVENOUS (AV) FISTULA CREATION;  Surgeon: Conrad Wheatland, MD;  Location: [facility];  Service: Vascular;  Laterality: Left;     History   Social History  . Marital Status: Married    Spouse Name: Fraser Din    Number of Children: 1  . Years of Education: HS   Occupational History  .      Self-Employed   Social History Main Topics  . Smoking status: Former Smoker -- 2.00 packs/day for 31 years    Types: Cigarettes    Quit date: 06/23/1995  . Smokeless tobacco: Never Used  . Alcohol Use: No  . Drug Use: No  . Sexual Activity: Not on file   Other Topics Concern  . Not on file   Social History Narrative   Patient lives at home with spouse.   Caffiene Use: 1 cup daily    Family History  Problem Relation Age of Onset  . Adopted: Yes    Current Outpatient Prescriptions  Medication Sig Dispense Refill  . aspirin 81 MG tablet Take 81 mg by mouth daily.       . calcium acetate (PHOSLO) 667 MG capsule Take 1,334 mg by mouth 3 (three) times daily with meals.      Marland Kitchen esomeprazole (NEXIUM) 40 MG capsule Take 40 mg by mouth daily before breakfast.      . metoprolol tartrate (LOPRESSOR) 25 MG tablet Take 0.5 tablets (12.5 mg total) by mouth 2 (two) times daily.      . multivitamin (RENA-VIT) TABS tablet Take 1 tablet by mouth at bedtime.      . Nutritional Supplements (FEEDING SUPPLEMENT, NEPRO CARB STEADY,) LIQD Take 237 mLs by mouth 2 (two) times daily between meals.    0  . traMADol (ULTRAM) 50 MG tablet Take 50 mg by mouth every 4 (four) hours as needed for moderate pain.        No current facility-administered medications for this visit.     Allergies  Allergen Reactions  . Amoxicillin     Causes upset stomach  . Fentanyl     Confusion when patch applied    BP 118/78  Pulse 78  Resp 12  Ht 5\' 10"  (1.778 m)  Wt 178 lb (80.74 kg)  BMI 25.54 kg/m2  No results found.   Review of Systems  Constitutional: Negative for fever, chills and diaphoresis.  HENT: Negative for ear discharge, facial swelling, mouth sores, nosebleeds, sore throat and trouble swallowing.   Eyes: Negative for photophobia,  discharge and visual disturbance.  Respiratory: Negative for choking, chest tightness, shortness of breath and stridor.   Cardiovascular: Negative for chest pain and palpitations.  Gastrointestinal: Negative for nausea, vomiting, abdominal pain, diarrhea, constipation, blood in stool, abdominal distention, anal bleeding and rectal pain.  Endocrine: Negative for cold intolerance and heat intolerance.  Genitourinary: Negative for dysuria, urgency, difficulty urinating and testicular pain.  Musculoskeletal: Negative for arthralgias, back pain, gait problem and myalgias.  Skin: Negative for color change, pallor, rash and wound.  Allergic/Immunologic: Negative  for environmental allergies and food allergies.  Neurological: Negative for dizziness, speech difficulty, weakness, numbness and headaches.  Hematological: Negative for adenopathy. Does not bruise/bleed easily.  Psychiatric/Behavioral: Negative for hallucinations, confusion and agitation.       Objective:   Physical Exam  Constitutional: He is oriented to person, place, and time. He appears well-developed and well-nourished. He appears cachectic. He is active and cooperative.  Non-toxic appearance. He has a sickly appearance. No distress.  HENT:  Head: Normocephalic.  Mouth/Throat: Oropharynx is clear and moist. No oropharyngeal exudate.  Eyes: Conjunctivae and EOM are normal. Pupils are equal, round, and reactive to light. No scleral icterus.  Neck: Normal range of motion. Neck supple. No tracheal deviation present.  Cardiovascular: Normal rate, regular rhythm and intact distal pulses.   Pulmonary/Chest: Effort normal and breath sounds normal. No respiratory distress.  Abdominal: Soft. Bowel sounds are normal. He exhibits no distension. There is no tenderness. There is no rigidity and no guarding. No hernia. Hernia confirmed negative in the ventral area, confirmed negative in the right inguinal area and confirmed negative in the left  inguinal area.    Musculoskeletal: Normal range of motion. He exhibits no tenderness.       Arms: Lymphadenopathy:    He has no cervical adenopathy.       Right: No inguinal adenopathy present.       Left: No inguinal adenopathy present.  Neurological: He is alert and oriented to person, place, and time. No cranial nerve deficit. He exhibits normal muscle tone. Coordination normal.  Skin: Skin is warm and dry. No rash noted. He is not diaphoretic. No erythema. No pallor.  Psychiatric: He has a normal mood and affect. His behavior is normal. Judgment and thought content normal.       Assessment:     Pleasant but rather deconditioned male now dialysis dependent.  No hard restriction for attempted peritoneal dialysis catheter.     Plan:     I hope that he does not have much adhesions infraumbilically.  Plan laparoscopic exploration through right abdomen.  Placement of peritoneal dialysis catheter.  Hopefully outpatient procedure.  Due on an off dialysis day:  The anatomy & physiology of peritoneum was discussed.  Natural history risks without surgery of worsening renal failure was discussed.   I feel the risks of no intervention will lead to serious problems that outweigh the operative risks; therefore, I recommended placement of a peritoneal dialysis catheter.  I explained laparoscopic techniques with possible need for an open approach.    Risks such as bleeding, infection, abscess, injury to other organs, catheter occlusion or malpositioning, reoperation to remove/reposition the catheter, heart attack, death, and other risks were discussed.   I noted a good likelihood this will help address the problem.  Possibility that this will not be enough to compensate for the renal failure & need for further treatment such as hemodialysis was explained.  Goals of post-operative recovery were discussed as well.  We will work to minimize complications.   The patient is/will be getting training on  catheter use by dialysis nursing before and after surgery.  I stressed the importance of meticulous care & sterile technique to prevent catheter problems.  Questions were answered.  The patient expresses understanding & wishes to proceed with surgery.  Given the severe deconditioning, I recommended pulmonary clearance.  Dr. Melvyn Novas feels that he is a tolerable surgical risk for this.  We will proceed.

## 2013-08-02 ENCOUNTER — Observation Stay (HOSPITAL_COMMUNITY): Payer: Medicare Other

## 2013-08-02 LAB — CBC
HEMATOCRIT: 41 % (ref 39.0–52.0)
Hemoglobin: 13 g/dL (ref 13.0–17.0)
MCH: 30.7 pg (ref 26.0–34.0)
MCHC: 31.7 g/dL (ref 30.0–36.0)
MCV: 96.7 fL (ref 78.0–100.0)
Platelets: 147 10*3/uL — ABNORMAL LOW (ref 150–400)
RBC: 4.24 MIL/uL (ref 4.22–5.81)
RDW: 19.3 % — ABNORMAL HIGH (ref 11.5–15.5)
WBC: 5.5 10*3/uL (ref 4.0–10.5)

## 2013-08-02 LAB — MAGNESIUM: Magnesium: 2.1 mg/dL (ref 1.5–2.5)

## 2013-08-02 LAB — POCT I-STAT 3, ART BLOOD GAS (G3+)
ACID-BASE EXCESS: 5 mmol/L — AB (ref 0.0–2.0)
Bicarbonate: 27.5 mEq/L — ABNORMAL HIGH (ref 20.0–24.0)
O2 Saturation: 96 %
PH ART: 7.548 — AB (ref 7.350–7.450)
TCO2: 28 mmol/L (ref 0–100)
pCO2 arterial: 31.6 mmHg — ABNORMAL LOW (ref 35.0–45.0)
pO2, Arterial: 69 mmHg — ABNORMAL LOW (ref 80.0–100.0)

## 2013-08-02 LAB — BASIC METABOLIC PANEL
BUN: 48 mg/dL — AB (ref 6–23)
CHLORIDE: 96 meq/L (ref 96–112)
CO2: 25 mEq/L (ref 19–32)
CREATININE: 6.26 mg/dL — AB (ref 0.50–1.35)
Calcium: 8.6 mg/dL (ref 8.4–10.5)
GFR calc Af Amer: 10 mL/min — ABNORMAL LOW (ref >90)
GFR calc non Af Amer: 8 mL/min — ABNORMAL LOW (ref >90)
Glucose, Bld: 84 mg/dL (ref 70–99)
Potassium: 3.6 mEq/L — ABNORMAL LOW (ref 3.7–5.3)
Sodium: 141 mEq/L (ref 137–147)

## 2013-08-02 LAB — GLUCOSE, CAPILLARY
GLUCOSE-CAPILLARY: 69 mg/dL — AB (ref 70–99)
GLUCOSE-CAPILLARY: 77 mg/dL (ref 70–99)
Glucose-Capillary: 71 mg/dL (ref 70–99)
Glucose-Capillary: 105 mg/dL — ABNORMAL HIGH (ref 70–99)

## 2013-08-02 LAB — PHOSPHORUS: Phosphorus: 5.3 mg/dL — ABNORMAL HIGH (ref 2.3–4.6)

## 2013-08-02 LAB — HEPATITIS B SURFACE ANTIGEN: Hepatitis B Surface Ag: NEGATIVE

## 2013-08-02 MED ORDER — DEXTROSE 50 % IV SOLN
INTRAVENOUS | Status: AC
  Administered 2013-08-02: 05:00:00
  Filled 2013-08-02: qty 50

## 2013-08-02 MED ORDER — FUROSEMIDE 10 MG/ML IJ SOLN
160.0000 mg | Freq: Once | INTRAMUSCULAR | Status: AC
  Administered 2013-08-02: 160 mg via INTRAVENOUS
  Filled 2013-08-02 (×2): qty 16

## 2013-08-02 MED ORDER — OXYCODONE HCL 5 MG PO TABS: 5.0000 mg | ORAL_TABLET | ORAL | Status: DC | PRN

## 2013-08-02 MED ORDER — DOXERCALCIFEROL 4 MCG/2ML IV SOLN
INTRAVENOUS | Status: AC
  Filled 2013-08-02: qty 4

## 2013-08-02 MED ORDER — DOXERCALCIFEROL 4 MCG/2ML IV SOLN
5.0000 ug | INTRAVENOUS | Status: DC
  Administered 2013-08-02: 5 ug via INTRAVENOUS
  Filled 2013-08-02: qty 4

## 2013-08-02 NOTE — Progress Notes (Signed)
[facility] Electrolyte Replacement Protocol  Patient Name: Victor Lane DOB: 10-15-1945 MRN: [id number]  Date of Service  08/02/2013   HPI/Events of Note    Recent Labs Lab 07/27/13 1534 08/01/13 0943 08/02/13 0220  NA 140 138 141  K 4.7 4.1 3.6*  CL 93*  --  96  CO2 28  --  25  GLUCOSE 133* 94 84  BUN 50*  --  48*  CREATININE 5.52*  --  6.26*  CALCIUM 9.5  --  8.6  MG  --   --  2.1  PHOS  --   --  5.3*    Estimated Creatinine Clearance: 12.6 ml/min (by C-G formula based on Cr of 6.26).  Intake/Output     02/10 0701 - 02/11 0700   I.V. (mL/kg) 409.1 (5.2)   Total Intake(mL/kg) 409.1 (5.2)   Net +409.1        - I/O DETAILED x24h    Total I/O In: 286.2 [I.V.:286.2] Out: -  - I/O THIS SHIFT    ASSESSMENT   eICURN Interventions   Electrolyte protocol criteria not met. Value replaced not per protocol. MD notified. No new orders   ASSESSMENT: MAJOR ELECTROLYTE    Lorene Dy 08/02/2013, 6:23 AM

## 2013-08-02 NOTE — Progress Notes (Addendum)
Discharged via wheelchair with all belongings. Family at side.

## 2013-08-02 NOTE — Progress Notes (Signed)
1 Day Post-Op  Subjective: Extubated.  [name] wants to go home.  No pain.   Objective: Vital signs in last 24 hours: Temp:  [94.6 F (34.8 C)-97.7 F (36.5 C)] 97.1 F (36.2 C) (02/11 1624) Pulse Rate:  [56-85] 81 (02/11 1624) Resp:  [8-30] 13 (02/11 1624) BP: (83-147)/(42-80) 127/57 mmHg (02/11 1624) SpO2:  [97 %-100 %] 100 % (02/11 1624) FiO2 (%):  [40 %] 40 % (02/11 0823) Weight:  [173 lb 15.1 oz (78.9 kg)-176 lb 9.4 oz (80.1 kg)] 173 lb 15.1 oz (78.9 kg) (02/11 1624) Last BM Date:  (PTA)  Intake/Output from previous day: 02/10 0701 - 02/11 0700 In: 470.9 [I.V.:470.9] Out: -  Intake/Output this shift: Total I/O In: 80.1 [I.V.:80.1] Out: 1017 [Other:1017]  General appearance: alert and cooperative Abdomen benign except for incisional tenderness no wound issues Lab Results:   Recent Labs  08/01/13 0943 08/02/13 0220  WBC  --  5.5  HGB 16.0 13.0  HCT 47.0 41.0  PLT  --  147*   BMET  Recent Labs  08/01/13 0943 08/02/13 0220  NA 138 141  K 4.1 3.6*  CL  --  96  CO2  --  25  GLUCOSE 94 84  BUN  --  48*  CREATININE  --  6.26*  CALCIUM  --  8.6   PT/INR No results found for this basename: LABPROT, INR,  in the last 72 hours ABG  Recent Labs  08/01/13 1737 08/02/13 0333  PHART 7.445 7.548*  HCO3 28.4* 27.5*    Studies/Results: Dg Chest Port 1 View  08/02/2013   CLINICAL DATA:  Respiratory failure.  EXAM: PORTABLE CHEST - 1 VIEW  COMPARISON:  DG CHEST 1V PORT dated 08/01/2013  FINDINGS: Endotracheal tube is approximately 3 cm above the carina. Degree of pulmonary edema at may be mildly improved. There are likely bilateral pleural effusions. The heart remains moderately enlarged.  IMPRESSION: Mild decrease in pulmonary edema.   Electronically Signed   By: Aletta Edouard M.D.   On: 08/02/2013 07:40   Dg Chest Port 1 View  08/01/2013   CLINICAL DATA:  Status post re-intubation  EXAM: PORTABLE CHEST - 1 VIEW  COMPARISON:  05/22/2013  FINDINGS: An  endotracheal tube is seen 3.5 cm above the carinal. The cardiac shadow is prominent. Vascular congestion with mild posteriorly layering effusions is identified. A temporary dialysis catheter is not seen on this film. Correlation with the clinical findings is recommended.  IMPRESSION: Vascular congestion and effusions which may be related to volume overload.  Endotracheal tube as described.   Electronically Signed   By: Inez Catalina M.D.   On: 08/01/2013 16:22    Anti-infectives: Anti-infectives   Start     Dose/Rate Route Frequency Ordered Stop   08/01/13 0600  clindamycin (CLEOCIN) IVPB 900 mg    Comments:  Pharmacy may adjust dosing strength, interval, or rate of medication as needed for optimal therapy for the patient Send with patient on call to the OR.  Anesthesia to complete antibiotic administration <38min prior to incision per [facility].   900 mg 100 mL/hr over 30 Minutes Intravenous On call to O.R. 07/31/13 1404 08/01/13 1030   08/01/13 0600  gentamicin (GARAMYCIN) IVPB 100 mg    Comments:  Pharmacy may adjust dosing strength, schedule, rate of infusion, etc as needed to optimize therapy Send with patient on call to the OR.  Anesthesia to complete antibiotic administration <88min prior to incision per [facility].   100 mg  200 mL/hr over 30 Minutes Intravenous On call to O.R. 07/31/13 1404 08/01/13 1115      Assessment/Plan: s/p Procedure(s): LAPAROSCOPIC INSERTION CONTINUOUS AMBULATORY PERITONEAL DIALYSIS CATHETER (N/A) LAPAROSCOPIC ABDOMINAL EXPLORATION (N/A) Discharge  LOS: 1 day    Victor Lane A. 08/02/2013

## 2013-08-02 NOTE — Procedures (Signed)
Extubation Procedure Note  Patient Details:   Name: Victor Lane DOB: 1945-08-04 MRN: [id number]   Airway Documentation:     Evaluation  O2 sats: stable throughout Complications: No apparent complications Patient did tolerate procedure well. Bilateral Breath Sounds: Rhonchi;Diminished Suctioning: Airway Yes  Mcneil Sober 08/02/2013, 10:47 AM

## 2013-08-02 NOTE — Progress Notes (Signed)
Name: Victor Lane MRN: [id number] DOB: 1946-02-22    ADMISSION DATE:  08/01/2013 CONSULTATION DATE:  08/01/13  REFERRING MD :  Dr Johney Maine PRIMARY SERVICE: [facility]  CHIEF COMPLAINT:  resp failure  BRIEF PATIENT DESCRIPTION: 68 year old with extensive PMH including severe cardiovascular disease and vocal cord cancer presenting to the PCCM from PACU after having surgery to place peritoneal dialysis catheter. Patient was extubated in the OR then reintubated in PACU due to inability to protect airway and respiratory failure. PCCM was consulted for vent management and assistance with extubation.  SIGNIFICANT EVENTS / STUDIES:  VDRF post op 2/10>>>  LINES / TUBES: ET tube 2/10>>>  PIV>>>  CULTURES: none  ANTIBIOTICS: Surgical prophylaxis only.  SUBJECTIVE: tried to remove tube overnight. Sedation restarted.  VITAL SIGNS: Temp:  [94.6 F (34.8 C)-97.8 F (36.6 C)] 97.4 F (36.3 C) (02/11 0500) Pulse Rate:  [56-101] 62 (02/11 0800) Resp:  [8-25] 14 (02/11 0800) BP: (83-155)/(35-80) 83/42 mmHg (02/11 0800) SpO2:  [67 %-100 %] 98 % (02/11 0800) FiO2 (%):  [40 %-100 %] 40 % (02/11 0700) Weight:  [175 lb 0.7 oz (79.4 kg)-179 lb 7.3 oz (81.4 kg)] 175 lb 0.7 oz (79.4 kg) (02/10 1735) HEMODYNAMICS:   VENTILATOR SETTINGS: Vent Mode:  [-] PRVC FiO2 (%):  [40 %-100 %] 40 % Set Rate:  [10 bmp-16 bmp] 14 bmp Vt Set:  [550 mL-750 mL] 550 mL PEEP:  [5 cmH20] 5 cmH20 Pressure Support:  [10 cmH20] 10 cmH20 Plateau Pressure:  [15 cmH20-18 cmH20] 15 cmH20 INTAKE / OUTPUT: Intake/Output     02/10 0701 - 02/11 0700 02/11 0701 - 02/12 0700   I.V. (mL/kg) 470.9 (5.9) 24.7 (0.3)   Total Intake(mL/kg) 470.9 (5.9) 24.7 (0.3)   Net +470.9 +24.7          PHYSICAL EXAMINATION: General: Chronically ill appearing male Neuro: Sedated and intubated, not following commands HEENT: [place]/AT, mmm Cardiovascular: RRR, Nl S1/S2, -M/R/G.  Lungs: Coarse BS diffusely.  Abdomen: Soft, ND and +BS.    Musculoskeletal: -edema and -tenderness.  Skin: Intact. Surgical site with bandage in place  LABS:  CBC  Recent Labs Lab 07/27/13 1534 08/01/13 0943 08/02/13 0220  WBC 8.0  --  5.5  HGB 13.0 16.0 13.0  HCT 41.2 47.0 41.0  PLT 159  --  147*   Coag's No results found for this basename: APTT, INR,  in the last 168 hours BMET  Recent Labs Lab 07/27/13 1534 08/01/13 0943 08/02/13 0220  NA 140 138 141  K 4.7 4.1 3.6*  CL 93*  --  96  CO2 28  --  25  BUN 50*  --  48*  CREATININE 5.52*  --  6.26*  GLUCOSE 133* 94 84   Electrolytes  Recent Labs Lab 07/27/13 1534 08/02/13 0220  CALCIUM 9.5 8.6  MG  --  2.1  PHOS  --  5.3*   Sepsis Markers No results found for this basename: LATICACIDVEN, PROCALCITON, O2SATVEN,  in the last 168 hours ABG  Recent Labs Lab 08/01/13 1737 08/02/13 0333  PHART 7.445 7.548*  PCO2ART 41.2 31.6*  PO2ART 74.0* 69.0*   Liver Enzymes No results found for this basename: AST, ALT, ALKPHOS, BILITOT, ALBUMIN,  in the last 168 hours Cardiac Enzymes No results found for this basename: TROPONINI, PROBNP,  in the last 168 hours Glucose  Recent Labs Lab 08/01/13 1733 08/01/13 1910 08/01/13 2002 08/02/13 0034 08/02/13 0450 08/02/13 0542  GLUCAP 66* 58* 101* 71 69*  105*    Imaging Dg Chest Port 1 View  08/02/2013   CLINICAL DATA:  Respiratory failure.  EXAM: PORTABLE CHEST - 1 VIEW  COMPARISON:  DG CHEST 1V PORT dated 08/01/2013  FINDINGS: Endotracheal tube is approximately 3 cm above the carina. Degree of pulmonary edema at may be mildly improved. There are likely bilateral pleural effusions. The heart remains moderately enlarged.  IMPRESSION: Mild decrease in pulmonary edema.   Electronically Signed   By: Aletta Edouard M.D.   On: 08/02/2013 07:40   Dg Chest Port 1 View  08/01/2013   CLINICAL DATA:  Status post re-intubation  EXAM: PORTABLE CHEST - 1 VIEW  COMPARISON:  05/22/2013  FINDINGS: An endotracheal tube is seen 3.5 cm above  the carinal. The cardiac shadow is prominent. Vascular congestion with mild posteriorly layering effusions is identified. A temporary dialysis catheter is not seen on this film. Correlation with the clinical findings is recommended.  IMPRESSION: Vascular congestion and effusions which may be related to volume overload.  Endotracheal tube as described.   Electronically Signed   By: Inez Catalina M.D.   On: 08/01/2013 16:22     CXR: bilateral edema  ASSESSMENT / PLAN:  PULMONARY  A: VDRF post PD catheter placement since patient was unable to wake up.  P:  - SBT to extubate today. - Titrate O2 for sat of 88-92%. - IS per RT protocol. - PT/OT. - OOB to chair.  CARDIOVASCULAR  A: HTN now with low BP P:  - D/C propofol with low BP. - Lasix 160 mg for pulm edema per renal. - KVO IVF.   RENAL  A: Chronic renal failure, PD catheter placed. Cr increased. P:  - Renal following  - KVO IVF.   GASTROINTESTINAL  A: No active issues.  P:  - Renal diet once extubated.  HEMATOLOGIC  A: Hg ok.  P:  - Trend CBC   INFECTIOUS  A: No active issues.  P:  - Monitor for fever.  - Gent/clinda for prophylaxis pre-op.  - No abx for now.   ENDOCRINE  A: DM  P:  - Monitor cbgs  NEUROLOGIC  A: Sedated post op, evidently difficult to wake up in previous anesthesia situations.  P:  - Propofol drip. Wean as tolerated for extubation. - Fentanyl pushes for pain.   Tommi Rumps, MD [facility] PGY-2  TODAY'S SUMMARY: SBT with hope of extubation today.  Ambulate, PT/OT and titrate O2 down.  I have personally obtained a history, examined the patient, evaluated laboratory and imaging results, formulated the assessment and plan and placed orders.  CRITICAL CARE: The patient is critically ill with multiple organ systems failure and requires high complexity decision making for assessment and support, frequent evaluation and titration of therapies, application of advanced  monitoring technologies and extensive interpretation of multiple databases. Critical Care Time devoted to patient care services described in this note is 35 minutes.   Rush Farmer, M.D. [facility]monary/Critical Care Medicine. Pager: [phone]. After hours pager: [phone].

## 2013-08-02 NOTE — Progress Notes (Signed)
[facility] Progress Note Patient Name: Victor Lane DOB: 05-04-1946 MRN: [id number]  Date of Service  08/02/2013   HPI/Events of Note    resp alk  eICU Interventions  Vent changes made   Intervention Category Major Interventions: Respiratory failure - evaluation and management  Merton Border 08/02/2013, 4:40 AM

## 2013-08-02 NOTE — Progress Notes (Addendum)
Discharge instructions given to patient and family members at bedside (wife and son). All instructions reviewed and questions answered. Prescription given (oxycodone). All belongings with wife. VSS 123/48 B/P, 83 HR, 18 Resp, 99% room air, no Shortness of breath or pain noted or verbalized at this time.  Monitoring equipment disconnected and Saline locks times two to right forearm discontinued with catheter tips intact.

## 2013-08-02 NOTE — Progress Notes (Signed)
Victor Lane [name]  [birth year] BP:[id number]  Patient Care Team: [name], MD as PCP - General (Family Medicine) Sueanne Margarita, MD (Cardiology) Brooks Sailors, RN as Registered Nurse (Oncology) Tanda Rockers, MD as Consulting Physician (Pulmonary Disease) Lucrezia Starch, MD as Consulting Physician (Nephrology)  This patient is a 68 y.o.male who calls today for surgical evaluation.   Date of procedure/visit: 08/01/2013  Surgery: Lap CAPD placement  Reason for call: Reintubation  Called by Dr Oletta Lamas w anesthesia.  Pt s/p lap CAPD placement that went smoothly & quickly.  EBL minimal.  Pt extubated but had to be reintubated.  Pt not getting strnger but following commands w/o focal neuro deficits.  No mjr secretions  I rec'd Pulmon/CCM eval since pt followed by Dr Melvyn Novas in the past.  Hopefully he will wake up gradually &  Be extubated later tonight vs AM & make sure no problems.  Dr Oletta Lamas agreed.  My covering partner, Dr Ninfa Linden, aware.  Patient Active Problem List   Diagnosis Date Noted  . Respiratory complication A999333  . Diastasis recti 07/11/2013  . COPD GOLD II 06/29/2013  . Hepatitis 05/22/2013  . Chronic respiratory failure 04/25/2013  . Leg weakness, bilateral 04/19/2013  . Pain in limb 04/19/2013  . Neuropathy 04/19/2013  . Lumbago 04/19/2013  . Other malaise and fatigue 04/19/2013  . End stage renal disease 03/31/2013  . Tracheostomy status 02/23/2013  . Dysphagia 02/16/2013  . Aspiration pneumonia 02/16/2013  . Protein-calorie malnutrition, severe 02/12/2013  . Acute respiratory failure 02/10/2013  . Altered mental status 02/10/2013  . Diabetes mellitus 02/10/2013  . HTN (hypertension) 02/10/2013  . Hyperkalemia 02/10/2013  . ESRD (end stage renal disease) on dialysis 02/10/2013  . Anemia of chronic disease 02/10/2013  . Transaminitis 02/10/2013  . High anion gap metabolic acidosis A999333  . 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 Occlusion and  stenosis of carotid artery without mention of cerebral infarction 03/24/2011    Past Medical History  Diagnosis Date  . Chest pain     non-ischemic stress test 02/2011 (Dr. Radford Pax)  . Carotid artery occlusion   . Neuropathy   . Arthritis   . H/O vitamin D deficiency   . Hearing loss   . Hypercholesteremia   . 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 S/P radiation therapy 01/24/2013-03/14/2013    Larynx/glottis / 63 Gy in 28 fractions  . Hypertension     takes Metoprolol daily  . GERD (gastroesophageal reflux disease)     takes Nexium daily  . Complication of anesthesia     hard to wake up and then panics waking up  . Family history of anesthesia complication     Adopted  . Sleep apnea     to request from Dr.Fried was done about 46yrs ago but no cpap d/t weight loss  . Pneumonia 01/2013  . Weakness     over whole body  . Fibromyalgia     takes tramadol daily as needed  . Myalgia and myositis   . Peripheral neuropathy     was on Lyrica but has been off x a week(Was only on for 3days)  . History of gout   . Rash     both lower legs and feet and using Mupirocin  . History of colon polyps   . Chronic kidney disease     - Stage 5- follwed by Dr Sharlotte Alamo in [place]  .  Diabetes mellitus     takes Metformin when needed d/t weight loss  . Cataracts, bilateral     immature    Past Surgical History  Procedure Laterality Date  . Cholecystectomy  1993  . Rotator cuff repair  07/06/2011    right  . Carotid endarterectomy Left   . Coolonoscopy    . Colonoscopy w/ biopsies and polypectomy      Hx: of  . Microlaryngoscopy with laser N/A 01/04/2013    Procedure: MICROLARYNGOSCOPY WITH BIOPSY/LASER;  Surgeon: [name], MD;  Location: [facility];  Service: ENT;  Laterality: N/A;  . Av fistula placement Left 02/23/2013    Procedure: ARTERIOVENOUS (AV) FISTULA CREATION;  Surgeon: Conrad Quinebaug, MD;  Location: [facility];  Service:  Vascular;  Laterality: Left;    History   Social History  . Marital Status: Married    Spouse Name: Fraser Din    Number of Children: 1  . Years of Education: HS   Occupational History  .      Self-Employed   Social History Main Topics  . Smoking status: Former Smoker -- 2.00 packs/day for 31 years    Types: Cigarettes  . Smokeless tobacco: Never Used     Comment: quit smoking in 1997  . Alcohol Use: No  . Drug Use: No  . Sexual Activity: Yes   Other Topics Concern  . Not on file   Social History Narrative   Patient lives at home with spouse.   Caffiene Use: 1 cup daily    Family History  Problem Relation Age of Onset  . Adopted: Yes    Current Facility-Administered Medications  Medication Dose Route Frequency Provider Last Rate Last Dose  . 0.9 %  sodium chloride infusion   Intravenous Continuous [name], MD 20 mL/hr at 08/02/13 0600    . 0.9 %  sodium chloride infusion  250 mL Intravenous PRN [name], MD      . acetaminophen (TYLENOL) tablet 650 mg  650 mg Oral Q4H PRN [name], MD       Or  . acetaminophen (TYLENOL) suppository 650 mg  650 mg Rectal Q4H PRN [name], MD      . acetaminophen (TYLENOL) tablet 1,000 mg  1,000 mg Oral TID [name], MD      . alum & mag hydroxide-simeth (MAALOX/MYLANTA) 200-200-20 MG/5ML suspension 30 mL  30 mL Oral Q6H PRN [name], MD      . chlorhexidine (PERIDEX) 0.12 % solution 15 mL  15 mL Mouth/Throat BID Rush Farmer, MD   15 mL at 08/01/13 2026  . diphenhydrAMINE (BENADRYL) tablet 50 mg  50 mg Oral Q6H PRN [name], MD      . fentaNYL (SUBLIMAZE) injection 25-100 mcg  25-100 mcg Intravenous Q2H PRN Rush Farmer, MD   100 mcg at 08/01/13 2026  . lip balm (BLISTEX) ointment   Topical BID [name], MD      . magic mouthwash  15 mL Oral QID PRN [name], MD      . metoprolol (LOPRESSOR) injection 5 mg  5 mg Intravenous Q6H PRN [name], MD      . metoprolol  tartrate (LOPRESSOR) tablet 12.5 mg  12.5 mg Oral BID [name], MD      . morphine 2 MG/ML injection 1-4 mg  1-4 mg Intravenous Q2H PRN [name], MD      .  ondansetron (ZOFRAN) injection 4 mg  4 mg Intravenous Q6H PRN [name], MD      . oxyCODONE (Oxy IR/ROXICODONE) immediate release tablet 5-10 mg  5-10 mg Oral Q4H PRN [name], MD      . pantoprazole (PROTONIX) EC tablet 80 mg  80 mg Oral Q1200 [name], MD      . propofol (DIPRIVAN) 10 mg/ml infusion  5-70 mcg/kg/min Intravenous Continuous Rush Farmer, MD 14.7 mL/hr at 08/02/13 0600 30 mcg/kg/min at 08/02/13 0600  . sodium chloride 0.9 % injection 3 mL  3 mL Intravenous Q12H [name], MD   3 mL at 08/01/13 2200  . sodium chloride 0.9 % injection 3 mL  3 mL Intravenous PRN [name], MD      . sodium chloride 0.9 % injection 3 mL  3 mL Intravenous Q12H [name], MD   3 mL at 08/01/13 2200  . sodium chloride 0.9 % injection 3 mL  3 mL Intravenous PRN [name], MD      . traMADol Veatrice Bourbon) tablet 50-100 mg  50-100 mg Oral Q6H PRN [name], MD         Allergies  Allergen Reactions  . Amoxicillin     Causes upset stomach  . Fentanyl     Confusion when patch applied    BP 119/80  Pulse 59  Temp(Src) 97.4 F (36.3 C) (Axillary)  Resp 8  Ht 6' (1.829 m)  Wt 175 lb 0.7 oz (79.4 kg)  BMI 23.74 kg/m2  SpO2 100%  Dg Chest Port 1 View  08/01/2013   CLINICAL DATA:  Status post re-intubation  EXAM: PORTABLE CHEST - 1 VIEW  COMPARISON:  05/22/2013  FINDINGS: An endotracheal tube is seen 3.5 cm above the carinal. The cardiac shadow is prominent. Vascular congestion with mild posteriorly layering effusions is identified. A temporary dialysis catheter is not seen on this film. Correlation with the clinical findings is recommended.  IMPRESSION: Vascular congestion and effusions which may be related to volume overload.  Endotracheal tube as described.   Electronically Signed   By:  Inez Catalina M.D.   On: 08/01/2013 16:22    Note: This dictation was prepared with Dragon/digital dictation along with [employer]. Any transcriptional errors that result from this process are unintentional.

## 2013-08-02 NOTE — Consult Note (Addendum)
[facility] Renal Consultation Note  Requesting MD: Gross/CCM Indication for Consultation:  Stave 4 ckd- s/p PD cath placement- post op resp failure req intubation  HPI:  Victor Lane is a 68 y.o. male with stage 4 CKD of unknown etiology followed by Dr. Lorrene Reid as OP who presented for a PD catheter placement yesterday per Dr. Johney Maine.  This procedure went as planned but then pt required reintubation in PACU due to inability to protect airway- he did not seem to be hypoxic or have a respiratory acidosis.  He has remained intubated overnight on minimal settings- is on propofol and sedated.  I was called because it was mentioned that he may need dialysis prior to extubation ? He does have pulmonary edema/effusions on CXR- he was given lasix but no out put and no foley? BUN is not to a point where I think it could be affecting his MS.  He is currently sedated. He does have an AVF in place   Have learned that he is already on chronic dialysis at [place] [facility]- did not understand that before.   Creatinine  Date/Time Value Ref Range Status  03/09/2013  3:20 PM 5.8 Repeated and Verified* 0.7 - 1.3 mg/dL Final     Creatinine, Ser  Date/Time Value Ref Range Status  08/02/2013  2:20 AM 6.26* 0.50 - 1.35 mg/dL Final  07/27/2013  3:34 PM 5.52* 0.50 - 1.35 mg/dL Final  05/24/2013  7:00 AM 6.66* 0.50 - 1.35 mg/dL Final  05/23/2013  5:43 AM 4.98* 0.50 - 1.35 mg/dL Final     DELTA CHECK NOTED  05/22/2013 12:01 PM 8.42* 0.50 - 1.35 mg/dL Final  05/01/2013  6:36 PM 3.51* 0.50 - 1.35 mg/dL Final  02/25/2013  9:05 AM 5.94* 0.50 - 1.35 mg/dL Final  02/24/2013  6:39 AM 8.81* 0.50 - 1.35 mg/dL Final  02/23/2013  6:05 AM 6.29* 0.50 - 1.35 mg/dL Final  02/22/2013  6:45 AM 9.14* 0.50 - 1.35 mg/dL Final  02/22/2013  5:24 AM 8.55* 0.50 - 1.35 mg/dL Final  02/20/2013  6:54 AM 10.05* 0.50 - 1.35 mg/dL Final  02/17/2013  7:08 AM 7.40* 0.50 - 1.35 mg/dL Final     DELTA CHECK NOTED  02/16/2013  4:00 AM 5.29* 0.50 - 1.35 mg/dL Final   02/15/2013  5:00 AM 6.54* 0.50 - 1.35 mg/dL Final  02/14/2013  5:00 AM 5.22* 0.50 - 1.35 mg/dL Final     DELTA CHECK NOTED  02/13/2013  4:20 AM 8.86* 0.50 - 1.35 mg/dL Final  02/12/2013  5:00 AM 7.26* 0.50 - 1.35 mg/dL Final  02/11/2013  5:20 AM 9.27* 0.50 - 1.35 mg/dL Final  02/10/2013  6:19 PM 6.97* 0.50 - 1.35 mg/dL Final  02/10/2013  1:26 PM 10.44* 0.50 - 1.35 mg/dL Final  02/10/2013 10:00 AM 11.22* 0.50 - 1.35 mg/dL Final  01/04/2013 10:04 AM 7.82* 0.50 - 1.35 mg/dL Final  04/02/2011  4:05 AM 3.19* 0.50 - 1.35 mg/dL Final  04/01/2011  7:49 AM 4.06* 0.50 - 1.35 mg/dL Final  03/27/2011 10:52 AM 3.09* 0.50 - 1.35 mg/dL Final     PMHx:   Past Medical History  Diagnosis Date  . Chest pain     non-ischemic stress test 02/2011 (Dr. Radford Pax)  . Carotid artery occlusion   . Neuropathy   . Arthritis   . H/O vitamin D deficiency   . Hearing loss   . Hypercholesteremia   . Vocal cord cancer 01/04/13    Invasive Squamous Cell Carcinoma of the Right and  Left Vocal Cords  . S/P radiation therapy 01/24/2013-03/14/2013    Larynx/glottis / 63 Gy in 28 fractions  . Hypertension     takes Metoprolol daily  . GERD (gastroesophageal reflux disease)     takes Nexium daily  . Complication of anesthesia     hard to wake up and then panics waking up  . Family history of anesthesia complication     Adopted  . Sleep apnea     to request from Dr.Fried was done about 42yr ago but no cpap d/t weight loss  . Pneumonia 01/2013  . Weakness     over whole body  . Fibromyalgia     takes tramadol daily as needed  . Myalgia and myositis   . Peripheral neuropathy     was on Lyrica but has been off x a week(Was only on for 3days)  . History of gout   . Rash     both lower legs and feet and using Mupirocin  . History of colon polyps   . Chronic kidney disease     - Stage 5- foll[place] by [place]arlotte [place] . Diabetes mellitus     takes Metformin when needed d/t weight loss  . Cataracts, bilateral      immature    Past Surgical History  Procedure Laterality Date  . Cholecystectomy  1993  . Rotator cuff repair  07/06/2011    right  . Carotid endarterectomy Left   . Coolonoscopy    . Colonoscopy w/ biopsies and polypectomy      Hx: of  . Microlaryngoscopy with laser N/A 01/04/2013    Procedure: MICROLARYNGOSCOPY WITH BIOPSY/LASER;  Surgeon: JMelissa Montane MD;  [place]  Service: ENT;  Laterality: N/A;  . Av fistula placement Left 02/23/2013    Procedure: ARTERIOVENOUS (AV) FISTULA CREATION;  Surgeon: BConrad Dodds[place] MD;  [facility]  Service: Vascular;  Laterality: Left;    Family Hx:  Family History  Problem Relation Age of Onset  . Adopted: Yes    Social History:  reports that he has quit smoking. His smoking use included Cigarettes. He has a 62 pack-year smoking history. He has never used smokeless tobacco. He reports that he does not drink alcohol or use illicit drugs.  Allergies:  Allergies  Allergen Reactions  . Amoxicillin     Causes upset stomach  . Fentanyl     Confusion when patch applied    Medications: Prior to Admission medications   Medication Sig Start Date End Date Taking? Authorizing Provider  aspirin 81 MG tablet Take 81 mg by mouth daily.    Yes Historical Provider, MD  calcium acetate (PHOSLO) 667 MG capsule Take 1,334 mg by mouth 3 (three) times daily with meals.   Yes Historical Provider, MD  diphenhydrAMINE (BENADRYL) 25 MG tablet Take 50 mg by mouth every 6 (six) hours as needed.   Yes Historical Provider, MD  esomeprazole (NEXIUM) 40 MG capsule Take 40 mg by mouth daily before breakfast.   Yes Historical Provider, MD  glipiZIDE (GLUCOTROL XL) 2.5 MG 24 hr tablet Take 2.5 mg by mouth daily as needed (diabetes).   Yes Historical Provider, MD  metoprolol tartrate (LOPRESSOR) 25 MG tablet Take 0.5 tablets (12.5 mg total) by mouth 2 (two) times daily. 02/25/13  Yes Tammy S Parrett, NP  multivitamin (RENA-VIT) TABS tablet Take 1 tablet by mouth daily.     Yes Historical Provider, MD  Nutritional Supplements (FEEDING SUPPLEMENT, NEPRO CARB  STEADY,) LIQD Take 237 mLs by mouth 2 (two) times daily between meals. 05/24/13  Yes Barton Dubois, MD  triamcinolone cream (KENALOG) 0.1 % Apply 1 application topically 2 (two) times daily. Apply to knee and lower leg. 07/18/13  Yes Historical Provider, MD  traMADol (ULTRAM) 50 MG tablet Take 1-2 tablets (50-100 mg total) by mouth every 6 (six) hours as needed for moderate pain or severe pain. 08/01/13   [name], MD    I have reviewed the patient's current medications.  Labs:  Results for orders placed during the hospital encounter of 08/01/13 (from the past 48 hour(s))  POCT I-STAT 4, (NA,K, GLUC, HGB,HCT)     Status: None   Collection Time    08/01/13  9:43 AM      Result Value Ref Range   Sodium 138  137 - 147 mEq/L   Potassium 4.1  3.7 - 5.3 mEq/L   Glucose, Bld 94  70 - 99 mg/dL   HCT 47.0  39.0 - 52.0 %   Hemoglobin 16.0  13.0 - 17.0 g/dL  GLUCOSE, CAPILLARY     Status: Abnormal   Collection Time    08/01/13 12:13 PM      Result Value Ref Range   Glucose-Capillary 116 (*) 70 - 99 mg/dL  GLUCOSE, CAPILLARY     Status: Abnormal   Collection Time    08/01/13  5:33 PM      Result Value Ref Range   Glucose-Capillary 66 (*) 70 - 99 mg/dL  POCT I-STAT 3, BLOOD GAS (G3+)     Status: Abnormal   Collection Time    08/01/13  5:37 PM      Result Value Ref Range   pH, Arterial 7.445  7.350 - 7.450   pCO2 arterial 41.2  35.0 - 45.0 mmHg   pO2, Arterial 74.0 (*) 80.0 - 100.0 mmHg   Bicarbonate 28.4 (*) 20.0 - 24.0 mEq/L   TCO2 30  0 - 100 mmol/L   O2 Saturation 95.0     Acid-Base Excess 4.0 (*) 0.0 - 2.0 mmol/L   Patient temperature 97.7 F     Collection site RADIAL, ALLEN'S TEST ACCEPTABLE     Drawn by Operator     Sample type ARTERIAL    MRSA PCR SCREENING     Status: None   Collection Time    08/01/13  5:41 PM      Result Value Ref Range   MRSA by PCR NEGATIVE  NEGATIVE   Comment:             The GeneXpert MRSA Assay (FDA     approved for NASAL specimens     only), is one component of a     comprehensive MRSA colonization     surveillance program. It is not     intended to diagnose MRSA     infection nor to guide or     monitor treatment for     MRSA infections.  GLUCOSE, CAPILLARY     Status: Abnormal   Collection Time    08/01/13  7:10 PM      Result Value Ref Range   Glucose-Capillary 58 (*) 70 - 99 mg/dL  GLUCOSE, CAPILLARY     Status: Abnormal   Collection Time    08/01/13  8:02 PM      Result Value Ref Range   Glucose-Capillary 101 (*) 70 - 99 mg/dL  GLUCOSE, CAPILLARY     Status: None   Collection Time  08/02/13 12:34 AM      Result Value Ref Range   Glucose-Capillary 71  70 - 99 mg/dL  CBC     Status: Abnormal   Collection Time    08/02/13  2:20 AM      Result Value Ref Range   WBC 5.5  4.0 - 10.5 K/uL   RBC 4.24  4.22 - 5.81 MIL/uL   Hemoglobin 13.0  13.0 - 17.0 g/dL   Comment: REPEATED TO VERIFY     DELTA CHECK NOTED   HCT 41.0  39.0 - 52.0 %   MCV 96.7  78.0 - 100.0 fL   MCH 30.7  26.0 - 34.0 pg   MCHC 31.7  30.0 - 36.0 g/dL   RDW 19.3 (*) 11.5 - 15.5 %   Platelets 147 (*) 150 - 400 K/uL  BASIC METABOLIC PANEL     Status: Abnormal   Collection Time    08/02/13  2:20 AM      Result Value Ref Range   Sodium 141  137 - 147 mEq/L   Potassium 3.6 (*) 3.7 - 5.3 mEq/L   Chloride 96  96 - 112 mEq/L   CO2 25  19 - 32 mEq/L   Glucose, Bld 84  70 - 99 mg/dL   BUN 48 (*) 6 - 23 mg/dL   Creatinine, Ser 6.26 (*) 0.50 - 1.35 mg/dL   Calcium 8.6  8.4 - 10.5 mg/dL   GFR calc non Af Amer 8 (*) >90 mL/min   GFR calc Af Amer 10 (*) >90 mL/min   Comment: (NOTE)     The eGFR has been calculated using the CKD EPI equation.     This calculation has not been validated in all clinical situations.     eGFR's persistently <90 mL/min signify possible Chronic Kidney     Disease.  MAGNESIUM     Status: None   Collection Time    08/02/13  2:20 AM       Result Value Ref Range   Magnesium 2.1  1.5 - 2.5 mg/dL  PHOSPHORUS     Status: Abnormal   Collection Time    08/02/13  2:20 AM      Result Value Ref Range   Phosphorus 5.3 (*) 2.3 - 4.6 mg/dL  POCT I-STAT 3, BLOOD GAS (G3+)     Status: Abnormal   Collection Time    08/02/13  3:33 AM      Result Value Ref Range   pH, Arterial 7.548 (*) 7.350 - 7.450   pCO2 arterial 31.6 (*) 35.0 - 45.0 mmHg   pO2, Arterial 69.0 (*) 80.0 - 100.0 mmHg   Bicarbonate 27.5 (*) 20.0 - 24.0 mEq/L   TCO2 28  0 - 100 mmol/L   O2 Saturation 96.0     Acid-Base Excess 5.0 (*) 0.0 - 2.0 mmol/L   Patient temperature 98.6 F     Collection site RADIAL, ALLEN'S TEST ACCEPTABLE     Drawn by Operator     Sample type ARTERIAL    GLUCOSE, CAPILLARY     Status: Abnormal   Collection Time    08/02/13  4:50 AM      Result Value Ref Range   Glucose-Capillary 69 (*) 70 - 99 mg/dL   Comment 1 Notify RN    GLUCOSE, CAPILLARY     Status: Abnormal   Collection Time    08/02/13  5:42 AM      Result Value Ref Range   Glucose-Capillary 105 (*)  70 - 99 mg/dL   Comment 1 Documented in Chart       ROS:  Review of systems not obtained due to patient factors.  Physical Exam: Filed Vitals:   08/02/13 0700  BP: 110/51  Pulse: 63  Temp:   Resp: 11     General: sedated, intubated HEENT: PERRLA, EOMI Neck: no JVD Heart: RRR Lungs: mostly clear Abdomen: s/p PD cath, non distended Extremities: no edema- has left AVF placed 02/2013    Assessment/Plan: 68 year old WM with stage 4 CKD- s/p PD cath placement yest- required reintubation in PACU  1.Renal- I did not understand that he was already on dialysis when I saw him previously- now that he is extubated it is clear- he does not make much urine so will d/c foley- have talked to his Op unit, so will get HD done today here and then patient will be [facility] for discharge 2. Hypertension/volume  - CXR shows pul edema/effusions- Hd today should fix    Rozell Theiler  A 08/02/2013, 7:57 AM

## 2013-08-02 NOTE — Progress Notes (Signed)
PT Cancellation Note  Patient Details Name: Victor Lane MRN: [id number] DOB: November 22, 1945   Cancelled Treatment:    Reason Eval/Treat Not Completed: Patient at procedure or test/unavailable (at dialysis)   Duncan Dull 08/02/2013, 3:06 PM Alben Deeds, [place] DPT  [phone]

## 2013-08-02 NOTE — Progress Notes (Signed)
Victor Lane Victor Lane [id number] 1945/06/28  CARE TEAM:  PCP: [name], MD  Outpatient Care Team: Patient Care Team: [name], MD as PCP - General (Family Medicine) Sueanne Margarita, MD (Cardiology) Brooks Sailors, RN as Registered Nurse (Oncology) Tanda Rockers, MD as Consulting Physician (Pulmonary Disease) Lucrezia Starch, MD as Consulting Physician (Nephrology)  Inpatient Treatment Team: Treatment Team: Attending Provider: [name], MD; Rounding Team: Md Pccm, MD   Subjective:  Pt resting comfortably On propofol - occ reached for ETT but watched closely HD stable On min vent settings ICU RNs in room   Objective:  Vital signs:  Filed Vitals:   08/02/13 0400 08/02/13 0443 08/02/13 0500 08/02/13 0600  BP: 107/47  122/45 119/80  Pulse: 56  57 59  Temp:  94.6 F (34.8 C) 97.4 F (36.3 C)   TempSrc:  Oral Axillary   Resp: _0 Height:      Weight:      SpO2: 99% 100% 100% 100%    Last BM Date:  (PTA)  Intake/Output   Yesterday:  02/10 0701 - 02/11 0700 In: 436.2 [I.V.:436.2] Out: -  This shift:     Bowel function:  Flatus: n  BM: n  Drain: n/a  Physical Exam:  General: Pt awakens to follow commands.  In no acute distress Eyes: PERRL, normal EOM.  Sclera clear.  No icterus Neuro: CN II-XII intact w/o focal sensory/motor deficits. Lymph: No head/neck/groin lymphadenopathy Psych:  No delerium/psychosis/paranoia HENT: Normocephalic, Mucus membranes moist.  No thrush.  ETT in place Neck: Supple, No tracheal deviation Chest: No chest wall pain w good excursion CV:  Pulses intact.  Regular rhythm MS: Normal AROM mjr joints.  No obvious deformity Abdomen: Soft.  Nondistended.  Nontender.  Dressing c/d/i.  No evidence of peritonitis.  No incarcerated hernias. Ext:  SCDs BLE.  No mjr edema.  No cyanosis Skin: No petechiae / purpura   Problem List:   Active Problems:   Respiratory complication   Assessment  Victor Lane Victor Lane   68 y.o. male  1 Day Post-Op  Procedure(s): LAPAROSCOPIC INSERTION CONTINUOUS AMBULATORY PERITONEAL DIALYSIS CATHETER LAPAROSCOPIC ABDOMINAL EXPLORATION  Resp failure after lap CAPD placement  Plan:  -trial of extubation -nephrology consult to help manage - poss HD preextubation vs later as outpt if flies off vent? -Pulm/CCM help appreciated -VTE prophylaxis- SCDs, etc -mobilize as tolerated to help recovery  [name], M.D., F.A.C.S. Gastrointestinal and Minimally Invasive Surgery [facility] [address] [place], [place] [address] [phone] Main / Paging   08/02/2013   Results:   Labs: Results for orders placed during the hospital encounter of 08/01/13 (from the past 48 hour(s))  POCT I-STAT 4, (NA,K, GLUC, HGB,HCT)     Status: None   Collection Time    08/01/13  9:43 AM      Result Value Ref Range   Sodium 138  137 - 147 mEq/L   Potassium 4.1  3.7 - 5.3 mEq/L   Glucose, Bld 94  70 - 99 mg/dL   HCT 47.0  39.0 - 52.0 %   Hemoglobin 16.0  13.0 - 17.0 g/dL  GLUCOSE, CAPILLARY     Status: Abnormal   Collection Time    08/01/13 12:13 PM      Result Value Ref Range   Glucose-Capillary 116 (*) 70 - 99 mg/dL  GLUCOSE, CAPILLARY     Status: Abnormal   Collection  Time    08/01/13  5:33 PM      Result Value Ref Range   Glucose-Capillary 66 (*) 70 - 99 mg/dL  POCT I-STAT 3, BLOOD GAS (G3+)     Status: Abnormal   Collection Time    08/01/13  5:37 PM      Result Value Ref Range   pH, Arterial 7.445  7.350 - 7.450   pCO2 arterial 41.2  35.0 - 45.0 mmHg   pO2, Arterial 74.0 (*) 80.0 - 100.0 mmHg   Bicarbonate 28.4 (*) 20.0 - 24.0 mEq/L   TCO2 30  0 - 100 mmol/L   O2 Saturation 95.0     Acid-Base Excess 4.0 (*) 0.0 - 2.0 mmol/L   Patient temperature 97.7 F     Collection site RADIAL, ALLEN'S TEST ACCEPTABLE     Drawn by Operator     Sample type ARTERIAL    MRSA PCR SCREENING     Status: None   Collection Time    08/01/13   5:41 PM      Result Value Ref Range   MRSA by PCR NEGATIVE  NEGATIVE   Comment:            The GeneXpert MRSA Assay (FDA     approved for NASAL specimens     only), is one component of a     comprehensive MRSA colonization     surveillance program. It is not     intended to diagnose MRSA     infection nor to guide or     monitor treatment for     MRSA infections.  GLUCOSE, CAPILLARY     Status: Abnormal   Collection Time    08/01/13  7:10 PM      Result Value Ref Range   Glucose-Capillary 58 (*) 70 - 99 mg/dL  GLUCOSE, CAPILLARY     Status: Abnormal   Collection Time    08/01/13  8:02 PM      Result Value Ref Range   Glucose-Capillary 101 (*) 70 - 99 mg/dL  GLUCOSE, CAPILLARY     Status: None   Collection Time    08/02/13 12:34 AM      Result Value Ref Range   Glucose-Capillary 71  70 - 99 mg/dL  CBC     Status: Abnormal   Collection Time    08/02/13  2:20 AM      Result Value Ref Range   WBC 5.5  4.0 - 10.5 K/uL   RBC 4.24  4.22 - 5.81 MIL/uL   Hemoglobin 13.0  13.0 - 17.0 g/dL   Comment: REPEATED TO VERIFY     DELTA CHECK NOTED   HCT 41.0  39.0 - 52.0 %   MCV 96.7  78.0 - 100.0 fL   MCH 30.7  26.0 - 34.0 pg   MCHC 31.7  30.0 - 36.0 g/dL   RDW 19.3 (*) 11.5 - 15.5 %   Platelets 147 (*) 150 - 400 K/uL  BASIC METABOLIC PANEL     Status: Abnormal   Collection Time    08/02/13  2:20 AM      Result Value Ref Range   Sodium 141  137 - 147 mEq/L   Potassium 3.6 (*) 3.7 - 5.3 mEq/L   Chloride 96  96 - 112 mEq/L   CO2 25  19 - 32 mEq/L   Glucose, Bld 84  70 - 99 mg/dL   BUN 48 (*) 6 - 23 mg/dL   Creatinine,  Ser 6.26 (*) 0.50 - 1.35 mg/dL   Calcium 8.6  8.4 - 10.5 mg/dL   GFR calc non Af Amer 8 (*) >90 mL/min   GFR calc Af Amer 10 (*) >90 mL/min   Comment: (NOTE)     The eGFR has been calculated using the CKD EPI equation.     This calculation has not been validated in all clinical situations.     eGFR's persistently <90 mL/min signify possible Chronic Kidney      Disease.  MAGNESIUM     Status: None   Collection Time    08/02/13  2:20 AM      Result Value Ref Range   Magnesium 2.1  1.5 - 2.5 mg/dL  PHOSPHORUS     Status: Abnormal   Collection Time    08/02/13  2:20 AM      Result Value Ref Range   Phosphorus 5.3 (*) 2.3 - 4.6 mg/dL  POCT I-STAT 3, BLOOD GAS (G3+)     Status: Abnormal   Collection Time    08/02/13  3:33 AM      Result Value Ref Range   pH, Arterial 7.548 (*) 7.350 - 7.450   pCO2 arterial 31.6 (*) 35.0 - 45.0 mmHg   pO2, Arterial 69.0 (*) 80.0 - 100.0 mmHg   Bicarbonate 27.5 (*) 20.0 - 24.0 mEq/L   TCO2 28  0 - 100 mmol/L   O2 Saturation 96.0     Acid-Base Excess 5.0 (*) 0.0 - 2.0 mmol/L   Patient temperature 98.6 F     Collection site RADIAL, ALLEN'S TEST ACCEPTABLE     Drawn by Operator     Sample type ARTERIAL    GLUCOSE, CAPILLARY     Status: Abnormal   Collection Time    08/02/13  4:50 AM      Result Value Ref Range   Glucose-Capillary 69 (*) 70 - 99 mg/dL   Comment 1 Notify RN    GLUCOSE, CAPILLARY     Status: Abnormal   Collection Time    08/02/13  5:42 AM      Result Value Ref Range   Glucose-Capillary 105 (*) 70 - 99 mg/dL   Comment 1 Documented in Chart      Imaging / Studies: Dg Chest Port 1 View  08/01/2013   CLINICAL DATA:  Status post re-intubation  EXAM: PORTABLE CHEST - 1 VIEW  COMPARISON:  05/22/2013  FINDINGS: An endotracheal tube is seen 3.5 cm above the carinal. The cardiac shadow is prominent. Vascular congestion with mild posteriorly layering effusions is identified. A temporary dialysis catheter is not seen on this film. Correlation with the clinical findings is recommended.  IMPRESSION: Vascular congestion and effusions which may be related to volume overload.  Endotracheal tube as described.   Electronically Signed   By: Inez Catalina M.D.   On: 08/01/2013 16:22    Medications / Allergies: per chart  Antibiotics: Anti-infectives   Start     Dose/Rate Route Frequency Ordered Stop    08/01/13 0600  clindamycin (CLEOCIN) IVPB 900 mg    Comments:  Pharmacy may adjust dosing strength, interval, or rate of medication as needed for optimal therapy for the patient Send with patient on call to the OR.  Anesthesia to complete antibiotic administration <53mn [facility]   900 mg 100 mL/hr over 30 Minutes Intravenous On call to O.R. 07/31/13 1404 08/01/13 1030   08/01/13 0600  gentamicin (GARAMYCIN) IVPB 100 mg  Comments:  Pharmacy may adjust dosing strength, schedule, rate of infusion, etc as needed to optimize therapy Send with patient on call to the OR.  Anesthesia to complete antibiotic administration <31mn [facility]   100 mg 200 mL/hr over 30 Minutes Intravenous On call to O.R. 07/31/13 1404 08/01/13 1115       Note: This dictation was prepared with Dragon/digital dictation along with Smartphrase technology. Any transcriptional errors that result from this process are unintentional.

## 2013-08-03 ENCOUNTER — Encounter (HOSPITAL_COMMUNITY): Payer: Self-pay | Admitting: Surgery

## 2013-08-03 ENCOUNTER — Telehealth: Payer: Self-pay | Admitting: *Deleted

## 2013-08-03 ENCOUNTER — Telehealth (INDEPENDENT_AMBULATORY_CARE_PROVIDER_SITE_OTHER): Payer: Self-pay

## 2013-08-03 NOTE — Telephone Encounter (Signed)
Pt is s/p PD cath placement and hernia repair by Dr. Johney Maine.  Upon d/c he was given an Rx for Oxycodone/Oxy IR 5mg  by Dr. Johney Maine.  His wife says that it has not been signed.  Rx rewritten today and signed by Dr. Rosendo Gros for Dr. Johney Maine.  Pt's son will p/u today.

## 2013-08-03 NOTE — Telephone Encounter (Signed)
Patient wife called to express concern about 07/27/13 TSH result of 1.234:  "Seems on low side; he's been feeling tired".  She asked that Dr. Isidore Moos evaluate.  She stated further that patient's surgery for CAPD on Tuesday went well and that they look forward to beginning home dialysis is about 4 weeks.   Continuing to navigate as L3 (treatments completed) patient.  Gayleen Orem, RN, BSN, [facility] Head [facility] Neck Oncology Navigator [phone]

## 2013-08-06 ENCOUNTER — Inpatient Hospital Stay (HOSPITAL_COMMUNITY)
Admission: EM | Admit: 2013-08-06 | Discharge: 2013-08-15 | DRG: 871 | Disposition: A | Discharge status: Home / Self Care | Payer: Medicare Other | Attending: Internal Medicine | Admitting: Internal Medicine

## 2013-08-06 ENCOUNTER — Emergency Department (HOSPITAL_COMMUNITY): Payer: Medicare Other

## 2013-08-06 ENCOUNTER — Encounter (HOSPITAL_COMMUNITY): Payer: Self-pay | Admitting: Emergency Medicine

## 2013-08-06 DIAGNOSIS — I248 Other forms of acute ischemic heart disease: Secondary | ICD-10-CM | POA: Diagnosis present

## 2013-08-06 DIAGNOSIS — R740 Nonspecific elevation of levels of transaminase and lactic acid dehydrogenase [LDH]: Secondary | ICD-10-CM

## 2013-08-06 DIAGNOSIS — R1310 Dysphagia, unspecified: Secondary | ICD-10-CM

## 2013-08-06 DIAGNOSIS — J449 Chronic obstructive pulmonary disease, unspecified: Secondary | ICD-10-CM | POA: Diagnosis present

## 2013-08-06 DIAGNOSIS — I2582 Chronic total occlusion of coronary artery: Secondary | ICD-10-CM | POA: Diagnosis present

## 2013-08-06 DIAGNOSIS — A419 Sepsis, unspecified organism: Principal | ICD-10-CM | POA: Diagnosis present

## 2013-08-06 DIAGNOSIS — I10 Essential (primary) hypertension: Secondary | ICD-10-CM

## 2013-08-06 DIAGNOSIS — L0291 Cutaneous abscess, unspecified: Secondary | ICD-10-CM | POA: Diagnosis present

## 2013-08-06 DIAGNOSIS — E559 Vitamin D deficiency, unspecified: Secondary | ICD-10-CM | POA: Diagnosis present

## 2013-08-06 DIAGNOSIS — E43 Unspecified severe protein-calorie malnutrition: Secondary | ICD-10-CM | POA: Diagnosis present

## 2013-08-06 DIAGNOSIS — J9620 Acute and chronic respiratory failure, unspecified whether with hypoxia or hypercapnia: Secondary | ICD-10-CM | POA: Diagnosis present

## 2013-08-06 DIAGNOSIS — M129 Arthropathy, unspecified: Secondary | ICD-10-CM | POA: Diagnosis present

## 2013-08-06 DIAGNOSIS — E7800 Pure hypercholesterolemia, unspecified: Secondary | ICD-10-CM | POA: Diagnosis present

## 2013-08-06 DIAGNOSIS — R109 Unspecified abdominal pain: Secondary | ICD-10-CM

## 2013-08-06 DIAGNOSIS — R5383 Other fatigue: Secondary | ICD-10-CM

## 2013-08-06 DIAGNOSIS — R7401 Elevation of levels of liver transaminase levels: Secondary | ICD-10-CM

## 2013-08-06 DIAGNOSIS — I4891 Unspecified atrial fibrillation: Secondary | ICD-10-CM | POA: Diagnosis not present

## 2013-08-06 DIAGNOSIS — N186 End stage renal disease: Secondary | ICD-10-CM | POA: Diagnosis present

## 2013-08-06 DIAGNOSIS — Z880 Allergy status to penicillin: Secondary | ICD-10-CM

## 2013-08-06 DIAGNOSIS — I255 Ischemic cardiomyopathy: Secondary | ICD-10-CM

## 2013-08-06 DIAGNOSIS — H9190 Unspecified hearing loss, unspecified ear: Secondary | ICD-10-CM | POA: Diagnosis present

## 2013-08-06 DIAGNOSIS — G609 Hereditary and idiopathic neuropathy, unspecified: Secondary | ICD-10-CM | POA: Diagnosis present

## 2013-08-06 DIAGNOSIS — M545 Low back pain, unspecified: Secondary | ICD-10-CM

## 2013-08-06 DIAGNOSIS — I509 Heart failure, unspecified: Secondary | ICD-10-CM | POA: Diagnosis present

## 2013-08-06 DIAGNOSIS — R6520 Severe sepsis without septic shock: Secondary | ICD-10-CM

## 2013-08-06 DIAGNOSIS — G4730 Sleep apnea, unspecified: Secondary | ICD-10-CM | POA: Diagnosis present

## 2013-08-06 DIAGNOSIS — I214 Non-ST elevation (NSTEMI) myocardial infarction: Secondary | ICD-10-CM

## 2013-08-06 DIAGNOSIS — M109 Gout, unspecified: Secondary | ICD-10-CM | POA: Diagnosis present

## 2013-08-06 DIAGNOSIS — Z7982 Long term (current) use of aspirin: Secondary | ICD-10-CM

## 2013-08-06 DIAGNOSIS — D72819 Decreased white blood cell count, unspecified: Secondary | ICD-10-CM

## 2013-08-06 DIAGNOSIS — K759 Inflammatory liver disease, unspecified: Secondary | ICD-10-CM

## 2013-08-06 DIAGNOSIS — E876 Hypokalemia: Secondary | ICD-10-CM | POA: Diagnosis present

## 2013-08-06 DIAGNOSIS — I38 Endocarditis, valve unspecified: Secondary | ICD-10-CM

## 2013-08-06 DIAGNOSIS — I5033 Acute on chronic diastolic (congestive) heart failure: Secondary | ICD-10-CM

## 2013-08-06 DIAGNOSIS — Z930 Tracheostomy status: Secondary | ICD-10-CM

## 2013-08-06 DIAGNOSIS — J111 Influenza due to unidentified influenza virus with other respiratory manifestations: Secondary | ICD-10-CM | POA: Diagnosis present

## 2013-08-06 DIAGNOSIS — I4892 Unspecified atrial flutter: Secondary | ICD-10-CM

## 2013-08-06 DIAGNOSIS — K219 Gastro-esophageal reflux disease without esophagitis: Secondary | ICD-10-CM | POA: Diagnosis present

## 2013-08-06 DIAGNOSIS — J9600 Acute respiratory failure, unspecified whether with hypoxia or hypercapnia: Secondary | ICD-10-CM

## 2013-08-06 DIAGNOSIS — W010XXA Fall on same level from slipping, tripping and stumbling without subsequent striking against object, initial encounter: Secondary | ICD-10-CM | POA: Diagnosis present

## 2013-08-06 DIAGNOSIS — Z923 Personal history of irradiation: Secondary | ICD-10-CM

## 2013-08-06 DIAGNOSIS — R29898 Other symptoms and signs involving the musculoskeletal system: Secondary | ICD-10-CM

## 2013-08-06 DIAGNOSIS — Y92009 Unspecified place in unspecified non-institutional (private) residence as the place of occurrence of the external cause: Secondary | ICD-10-CM

## 2013-08-06 DIAGNOSIS — N2581 Secondary hyperparathyroidism of renal origin: Secondary | ICD-10-CM | POA: Diagnosis present

## 2013-08-06 DIAGNOSIS — C320 Malignant neoplasm of glottis: Secondary | ICD-10-CM

## 2013-08-06 DIAGNOSIS — G629 Polyneuropathy, unspecified: Secondary | ICD-10-CM

## 2013-08-06 DIAGNOSIS — R5381 Other malaise: Secondary | ICD-10-CM | POA: Diagnosis present

## 2013-08-06 DIAGNOSIS — I2510 Atherosclerotic heart disease of native coronary artery without angina pectoris: Secondary | ICD-10-CM | POA: Diagnosis present

## 2013-08-06 DIAGNOSIS — I5043 Acute on chronic combined systolic (congestive) and diastolic (congestive) heart failure: Secondary | ICD-10-CM | POA: Diagnosis present

## 2013-08-06 DIAGNOSIS — I079 Rheumatic tricuspid valve disease, unspecified: Secondary | ICD-10-CM | POA: Diagnosis present

## 2013-08-06 DIAGNOSIS — E875 Hyperkalemia: Secondary | ICD-10-CM | POA: Diagnosis present

## 2013-08-06 DIAGNOSIS — R188 Other ascites: Secondary | ICD-10-CM | POA: Diagnosis present

## 2013-08-06 DIAGNOSIS — I6529 Occlusion and stenosis of unspecified carotid artery: Secondary | ICD-10-CM

## 2013-08-06 DIAGNOSIS — I120 Hypertensive chronic kidney disease with stage 5 chronic kidney disease or end stage renal disease: Secondary | ICD-10-CM | POA: Diagnosis present

## 2013-08-06 DIAGNOSIS — Z9981 Dependence on supplemental oxygen: Secondary | ICD-10-CM

## 2013-08-06 DIAGNOSIS — E871 Hypo-osmolality and hyponatremia: Secondary | ICD-10-CM | POA: Diagnosis not present

## 2013-08-06 DIAGNOSIS — E119 Type 2 diabetes mellitus without complications: Secondary | ICD-10-CM

## 2013-08-06 DIAGNOSIS — L0390 Cellulitis, unspecified: Secondary | ICD-10-CM

## 2013-08-06 DIAGNOSIS — M6208 Separation of muscle (nontraumatic), other site: Secondary | ICD-10-CM

## 2013-08-06 DIAGNOSIS — J4489 Other specified chronic obstructive pulmonary disease: Secondary | ICD-10-CM | POA: Diagnosis present

## 2013-08-06 DIAGNOSIS — R9439 Abnormal result of other cardiovascular function study: Secondary | ICD-10-CM

## 2013-08-06 DIAGNOSIS — M79609 Pain in unspecified limb: Secondary | ICD-10-CM

## 2013-08-06 DIAGNOSIS — D638 Anemia in other chronic diseases classified elsewhere: Secondary | ICD-10-CM | POA: Diagnosis present

## 2013-08-06 DIAGNOSIS — R7989 Other specified abnormal findings of blood chemistry: Secondary | ICD-10-CM

## 2013-08-06 DIAGNOSIS — E872 Acidosis, unspecified: Secondary | ICD-10-CM | POA: Diagnosis present

## 2013-08-06 DIAGNOSIS — Z992 Dependence on renal dialysis: Secondary | ICD-10-CM

## 2013-08-06 DIAGNOSIS — J09X2 Influenza due to identified novel influenza A virus with other respiratory manifestations: Secondary | ICD-10-CM

## 2013-08-06 DIAGNOSIS — K659 Peritonitis, unspecified: Secondary | ICD-10-CM | POA: Diagnosis present

## 2013-08-06 DIAGNOSIS — R778 Other specified abnormalities of plasma proteins: Secondary | ICD-10-CM

## 2013-08-06 DIAGNOSIS — I739 Peripheral vascular disease, unspecified: Secondary | ICD-10-CM | POA: Diagnosis present

## 2013-08-06 DIAGNOSIS — I2489 Other forms of acute ischemic heart disease: Secondary | ICD-10-CM | POA: Diagnosis present

## 2013-08-06 DIAGNOSIS — J189 Pneumonia, unspecified organism: Secondary | ICD-10-CM | POA: Diagnosis present

## 2013-08-06 DIAGNOSIS — D696 Thrombocytopenia, unspecified: Secondary | ICD-10-CM | POA: Diagnosis present

## 2013-08-06 DIAGNOSIS — Z87891 Personal history of nicotine dependence: Secondary | ICD-10-CM

## 2013-08-06 LAB — COMPREHENSIVE METABOLIC PANEL
ALBUMIN: 3.2 g/dL — AB (ref 3.5–5.2)
ALT: 31 U/L (ref 0–53)
AST: 81 U/L — AB (ref 0–37)
Alkaline Phosphatase: 128 U/L — ABNORMAL HIGH (ref 39–117)
BUN: 35 mg/dL — AB (ref 6–23)
CALCIUM: 9.9 mg/dL (ref 8.4–10.5)
CO2: 16 mEq/L — ABNORMAL LOW (ref 19–32)
CREATININE: 5.76 mg/dL — AB (ref 0.50–1.35)
Chloride: 88 mEq/L — ABNORMAL LOW (ref 96–112)
GFR calc Af Amer: 11 mL/min — ABNORMAL LOW (ref >90)
GFR, EST NON AFRICAN AMERICAN: 9 mL/min — AB (ref >90)
Glucose, Bld: 71 mg/dL (ref 70–99)
Potassium: 6.4 mEq/L — ABNORMAL HIGH (ref 3.7–5.3)
Sodium: 137 mEq/L (ref 137–147)
Total Bilirubin: 1.1 mg/dL (ref 0.3–1.2)
Total Protein: 7.3 g/dL (ref 6.0–8.3)

## 2013-08-06 LAB — LIPASE, BLOOD: LIPASE: 34 U/L (ref 11–59)

## 2013-08-06 LAB — BASIC METABOLIC PANEL
BUN: 41 mg/dL — AB (ref 6–23)
CHLORIDE: 95 meq/L — AB (ref 96–112)
CO2: 19 meq/L (ref 19–32)
Calcium: 8.6 mg/dL (ref 8.4–10.5)
Creatinine, Ser: 5.92 mg/dL — ABNORMAL HIGH (ref 0.50–1.35)
GFR calc Af Amer: 10 mL/min — ABNORMAL LOW (ref >90)
GFR calc non Af Amer: 9 mL/min — ABNORMAL LOW (ref >90)
Glucose, Bld: 89 mg/dL (ref 70–99)
Potassium: 4.3 mEq/L (ref 3.7–5.3)
Sodium: 137 mEq/L (ref 137–147)

## 2013-08-06 LAB — CBC WITH DIFFERENTIAL/PLATELET
BASOS PCT: 0 % (ref 0–1)
Basophils Absolute: 0 10*3/uL (ref 0.0–0.1)
EOS PCT: 0 % (ref 0–5)
Eosinophils Absolute: 0 10*3/uL (ref 0.0–0.7)
HCT: 44.8 % (ref 39.0–52.0)
HEMOGLOBIN: 14 g/dL (ref 13.0–17.0)
LYMPHS PCT: 22 % (ref 12–46)
Lymphs Abs: 1.3 10*3/uL (ref 0.7–4.0)
MCH: 31.5 pg (ref 26.0–34.0)
MCHC: 31.3 g/dL (ref 30.0–36.0)
MCV: 100.7 fL — ABNORMAL HIGH (ref 78.0–100.0)
MONO ABS: 0.4 10*3/uL (ref 0.1–1.0)
Monocytes Relative: 7 % (ref 3–12)
NEUTROS ABS: 4.1 10*3/uL (ref 1.7–7.7)
NEUTROS PCT: 71 % (ref 43–77)
PLATELETS: 192 10*3/uL (ref 150–400)
RBC: 4.45 MIL/uL (ref 4.22–5.81)
RDW: 19.5 % — ABNORMAL HIGH (ref 11.5–15.5)
WBC: 5.9 10*3/uL (ref 4.0–10.5)

## 2013-08-06 LAB — CREATININE, SERUM
Creatinine, Ser: 5.77 mg/dL — ABNORMAL HIGH (ref 0.50–1.35)
GFR calc Af Amer: 11 mL/min — ABNORMAL LOW (ref >90)
GFR, EST NON AFRICAN AMERICAN: 9 mL/min — AB (ref >90)

## 2013-08-06 LAB — CG4 I-STAT (LACTIC ACID)
LACTIC ACID, VENOUS: 12.77 mmol/L — AB (ref 0.5–2.2)
Lactic Acid, Venous: 6.34 mmol/L — ABNORMAL HIGH (ref 0.5–2.2)

## 2013-08-06 LAB — GLUCOSE, CAPILLARY
GLUCOSE-CAPILLARY: 75 mg/dL (ref 70–99)
GLUCOSE-CAPILLARY: 86 mg/dL (ref 70–99)
Glucose-Capillary: 85 mg/dL (ref 70–99)

## 2013-08-06 LAB — CBC
HEMATOCRIT: 38.6 % — AB (ref 39.0–52.0)
HEMOGLOBIN: 12.1 g/dL — AB (ref 13.0–17.0)
MCH: 30.6 pg (ref 26.0–34.0)
MCHC: 31.3 g/dL (ref 30.0–36.0)
MCV: 97.7 fL (ref 78.0–100.0)
Platelets: 100 10*3/uL — ABNORMAL LOW (ref 150–400)
RBC: 3.95 MIL/uL — AB (ref 4.22–5.81)
RDW: 19 % — AB (ref 11.5–15.5)
WBC: 8.9 10*3/uL (ref 4.0–10.5)

## 2013-08-06 LAB — AMYLASE: Amylase: 1053 U/L — ABNORMAL HIGH (ref 0–105)

## 2013-08-06 LAB — TROPONIN I
TROPONIN I: 0.94 ng/mL — AB (ref <0.30)
TROPONIN I: 1.31 ng/mL — AB (ref <0.30)

## 2013-08-06 LAB — MRSA PCR SCREENING: MRSA by PCR: NEGATIVE

## 2013-08-06 LAB — PROCALCITONIN
PROCALCITONIN: 0.82 ng/mL
Procalcitonin: 34.6 ng/mL

## 2013-08-06 MED ORDER — IOHEXOL 300 MG/ML  SOLN: 50.0000 mL | Freq: Once | INTRAMUSCULAR | Status: DC | PRN

## 2013-08-06 MED ORDER — ACETAMINOPHEN 650 MG RE SUPP
650.0000 mg | Freq: Once | RECTAL | Status: AC
  Administered 2013-08-06: 650 mg via RECTAL

## 2013-08-06 MED ORDER — SODIUM CHLORIDE 0.9 % IV SOLN: 1000.0000 mL | INTRAVENOUS | Status: DC

## 2013-08-06 MED ORDER — HYDROMORPHONE HCL PF 1 MG/ML IJ SOLN
0.5000 mg | INTRAMUSCULAR | Status: DC | PRN
  Administered 2013-08-06 – 2013-08-15 (×11): 0.5 mg via INTRAVENOUS
  Filled 2013-08-06 (×10): qty 1

## 2013-08-06 MED ORDER — METRONIDAZOLE IN NACL 5-0.79 MG/ML-% IV SOLN
500.0000 mg | Freq: Three times a day (TID) | INTRAVENOUS | Status: DC
  Administered 2013-08-06 – 2013-08-09 (×9): 500 mg via INTRAVENOUS
  Filled 2013-08-06 (×15): qty 100

## 2013-08-06 MED ORDER — SODIUM CHLORIDE 0.9 % IV SOLN
1000.0000 mL | Freq: Once | INTRAVENOUS | Status: DC
Start: 2013-08-06 — End: 2013-08-06

## 2013-08-06 MED ORDER — SODIUM CHLORIDE 0.9 % IV SOLN
1000.0000 mL | Freq: Once | INTRAVENOUS | Status: AC
Start: 2013-08-06 — End: 2013-08-06
  Administered 2013-08-06: 1000 mL via INTRAVENOUS

## 2013-08-06 MED ORDER — HEPARIN SODIUM (PORCINE) 5000 UNIT/ML IJ SOLN
5000.0000 [IU] | Freq: Three times a day (TID) | INTRAMUSCULAR | Status: DC
  Administered 2013-08-06 – 2013-08-14 (×22): 5000 [IU] via SUBCUTANEOUS
  Filled 2013-08-06 (×31): qty 1

## 2013-08-06 MED ORDER — ALBUMIN HUMAN 25 % IV SOLN
INTRAVENOUS | Status: AC
  Filled 2013-08-06: qty 200

## 2013-08-06 MED ORDER — SODIUM CHLORIDE 0.9 % IV SOLN: INTRAVENOUS | Status: DC

## 2013-08-06 MED ORDER — AZTREONAM 1 G IJ SOLR
1.0000 g | INTRAMUSCULAR | Status: AC
  Administered 2013-08-06: 1 g via INTRAVENOUS
  Filled 2013-08-06: qty 1

## 2013-08-06 MED ORDER — VANCOMYCIN HCL 10 G IV SOLR
1500.0000 mg | INTRAVENOUS | Status: AC
  Administered 2013-08-06: 1500 mg via INTRAVENOUS
  Filled 2013-08-06: qty 1500

## 2013-08-06 MED ORDER — VANCOMYCIN HCL IN DEXTROSE 750-5 MG/150ML-% IV SOLN
750.0000 mg | Freq: Once | INTRAVENOUS | Status: AC
  Administered 2013-08-06: 750 mg via INTRAVENOUS
  Filled 2013-08-06: qty 150

## 2013-08-06 MED ORDER — IPRATROPIUM-ALBUTEROL 0.5-2.5 (3) MG/3ML IN SOLN
3.0000 mL | Freq: Four times a day (QID) | RESPIRATORY_TRACT | Status: DC
  Administered 2013-08-06 – 2013-08-07 (×6): 3 mL via RESPIRATORY_TRACT
  Filled 2013-08-06 (×6): qty 3

## 2013-08-06 MED ORDER — SODIUM CHLORIDE 0.9 % IV SOLN
1000.0000 mL | Freq: Once | INTRAVENOUS | Status: AC
  Administered 2013-08-06: 1000 mL via INTRAVENOUS

## 2013-08-06 MED ORDER — INSULIN ASPART 100 UNIT/ML ~~LOC~~ SOLN: 1.0000 [IU] | SUBCUTANEOUS | Status: DC

## 2013-08-06 MED ORDER — ONDANSETRON HCL 4 MG/2ML IJ SOLN
4.0000 mg | Freq: Once | INTRAMUSCULAR | Status: AC
  Administered 2013-08-06: 4 mg via INTRAVENOUS
  Filled 2013-08-06: qty 2

## 2013-08-06 MED ORDER — PANTOPRAZOLE SODIUM 40 MG PO TBEC
40.0000 mg | DELAYED_RELEASE_TABLET | Freq: Every day | ORAL | Status: DC
  Administered 2013-08-06 – 2013-08-15 (×9): 40 mg via ORAL
  Filled 2013-08-06 (×5): qty 1

## 2013-08-06 MED ORDER — ALBUTEROL SULFATE (2.5 MG/3ML) 0.083% IN NEBU: 2.5000 mg | INHALATION_SOLUTION | RESPIRATORY_TRACT | Status: DC | PRN

## 2013-08-06 MED ORDER — ACETAMINOPHEN 325 MG PO TABS
650.0000 mg | ORAL_TABLET | Freq: Once | ORAL | Status: DC
  Filled 2013-08-06: qty 2

## 2013-08-06 MED ORDER — SODIUM CHLORIDE 0.9 % IV SOLN
INTRAVENOUS | Status: DC
  Administered 2013-08-06: 22:00:00 via INTRAVENOUS

## 2013-08-06 MED ORDER — SODIUM CHLORIDE 0.9 % IV SOLN
1000.0000 mL | INTRAVENOUS | Status: DC
  Administered 2013-08-06: 1000 mL via INTRAVENOUS

## 2013-08-06 MED ORDER — SODIUM CHLORIDE 0.9 % IR SOLN
Freq: Once | Status: AC
  Administered 2013-08-06: 14:00:00
  Filled 2013-08-06 (×2): qty 1.2

## 2013-08-06 MED ORDER — DEXTROSE 5 % IV SOLN
1.0000 g | INTRAVENOUS | Status: DC
  Administered 2013-08-07 – 2013-08-09 (×3): 1 g via INTRAVENOUS
  Filled 2013-08-06 (×4): qty 1

## 2013-08-06 MED ORDER — METRONIDAZOLE IN NACL 5-0.79 MG/ML-% IV SOLN
500.0000 mg | Freq: Once | INTRAVENOUS | Status: AC
  Administered 2013-08-06: 500 mg via INTRAVENOUS
  Filled 2013-08-06: qty 100

## 2013-08-06 MED ORDER — MORPHINE SULFATE 4 MG/ML IJ SOLN
4.0000 mg | Freq: Once | INTRAMUSCULAR | Status: AC
  Administered 2013-08-06: 4 mg via INTRAVENOUS
  Filled 2013-08-06: qty 1

## 2013-08-06 MED ORDER — LEVOFLOXACIN IN D5W 750 MG/150ML IV SOLN
750.0000 mg | Freq: Once | INTRAVENOUS | Status: AC
  Administered 2013-08-06: 750 mg via INTRAVENOUS
  Filled 2013-08-06: qty 150

## 2013-08-06 MED ORDER — IOHEXOL 300 MG/ML  SOLN: 25.0000 mL | INTRAMUSCULAR | Status: AC

## 2013-08-06 MED ORDER — SODIUM CHLORIDE 0.9 % IV SOLN: 250.0000 mL | INTRAVENOUS | Status: DC | PRN

## 2013-08-06 NOTE — ED Notes (Signed)
Report given to [place], RN on [facility] and will be attempting to transfer patient at 0915.

## 2013-08-06 NOTE — Consult Note (Signed)
Admit date: 08/06/2013 Referring Physician  Dr. Oliver Pila  Primary Cardiologist  Dr. Fransico Him Reason for Consultation  Elevated troponin  HPI: 68 year old with extensive PMH including ESRD/stage 4 CKD with HD on M/W/F, w/ PD catheter placement on 2/10 . Presented to ER on 2/15 am with fever, abd pain , weakness. B/P marginal ~106, tachycardia. Lactate 13 . Felt to have SIRS/Sepsis . CT abd /pelvis showed mod right pleural effusion. No acute abd process noted.  PCCM was asked to admit. Was admitted 2/10 for a PD cath placement, had difficulty post op with inability to protect airway w/ reintubation requiring vent support overnight, He received HD and was extubated w/ discharge home on 2/11.  In ER Pt was given 2 L/ bolus w/ improved b/p and decreased HR. Pt has an extensive hx with prolonged critical illness 01/2013 w/ Acute resp failrue requiring trach now decannulated. He lives at home with wife who is also ill.  Says he has been very weak at home , fell yesterday after tripping on shoes. Says he is short of breath w/ no energy, woke up with abd pain. PD cath was draining clear liquid. Has had chills and fever since yesterday. Has diffuse abd pain. He will be admitted to ICU for further treatment and evaluation.  He was noted to have elevated troponin and now Cardiology is asked to consult.  He has no prior cardiac problems.  He had some CP in 2012 and nuclear stress test showed no ischemia and echo showed normal LVF with mildly dilated aortic root.  He does have a history of PVD with carotid artery disease.        PMH:   Past Medical History  Diagnosis Date  . Chest pain     non-ischemic stress test 02/2011 (Dr. Radford Pax)  . Carotid artery occlusion   . Neuropathy   . Arthritis   . H/O vitamin D deficiency   . Hearing loss   . Hypercholesteremia   . 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 S/P radiation therapy  01/24/2013-03/14/2013    Larynx/glottis / 63 Gy in 28 fractions  . Hypertension     takes Metoprolol daily  . GERD (gastroesophageal reflux disease)     takes Nexium daily  . Complication of anesthesia     hard to wake up and then panics waking up  . Family history of anesthesia complication     Adopted  . Sleep apnea     to request from Dr.Fried was done about 14yrs ago but no cpap d/t weight loss  . Pneumonia 01/2013  . Weakness     over whole body  . Fibromyalgia     takes tramadol daily as needed  . Myalgia and myositis   . Peripheral neuropathy     was on Lyrica but has been off x a week(Was only on for 3days)  . History of gout   . Rash     both lower legs and feet and using Mupirocin  . History of colon polyps   . Chronic kidney disease     - Stage 5- follwed by Dr Sharlotte Alamo in [place]  . Diabetes mellitus     takes Metformin when needed d/t weight loss  . Cataracts, bilateral     immature  . COPD (chronic obstructive pulmonary disease)      PSH:   Past Surgical History  Procedure Laterality Date  .  Cholecystectomy  1993  . Rotator cuff repair  07/06/2011    right  . Carotid endarterectomy Left   . Coolonoscopy    . Colonoscopy w/ biopsies and polypectomy      Hx: of  . Microlaryngoscopy with laser N/A 01/04/2013    Procedure: MICROLARYNGOSCOPY WITH BIOPSY/LASER;  Surgeon: [name], MD;  Location: [facility];  Service: ENT;  Laterality: N/A;  . Av fistula placement Left 02/23/2013    Procedure: ARTERIOVENOUS (AV) FISTULA CREATION;  Surgeon: Conrad [place], MD;  Location: [facility];  Service: Vascular;  Laterality: Left;  . Capd insertion N/A 08/01/2013    Procedure: LAPAROSCOPIC INSERTION CONTINUOUS AMBULATORY PERITONEAL DIALYSIS CATHETER;  Surgeon: [name], MD;  Location: [facility];  Service: General;  Laterality: N/A;  . Laparoscopic abdominal exploration N/A 08/01/2013    Procedure: LAPAROSCOPIC ABDOMINAL EXPLORATION;  Surgeon: [name], MD;  Location: [facility];   Service: General;  Laterality: N/A;    Allergies:  Amoxicillin and Fentanyl Prior to Admit Meds:   Prescriptions prior to admission  Medication Sig Dispense Refill  . aspirin 81 MG tablet Take 81 mg by mouth daily.       . calcium acetate (PHOSLO) 667 MG capsule Take 1,334 mg by mouth 3 (three) times daily with meals.      . diphenhydrAMINE (BENADRYL) 25 MG tablet Take 50 mg by mouth every 6 (six) hours as needed for itching.       . esomeprazole (NEXIUM) 40 MG capsule Take 40 mg by mouth daily before breakfast.      . glipiZIDE (GLUCOTROL XL) 2.5 MG 24 hr tablet Take 2.5 mg by mouth daily as needed (diabetes).      . metoprolol tartrate (LOPRESSOR) 25 MG tablet Take 0.5 tablets (12.5 mg total) by mouth 2 (two) times daily.      . multivitamin (RENA-VIT) TABS tablet Take 1 tablet by mouth daily.       . Nutritional Supplements (FEEDING SUPPLEMENT, NEPRO CARB STEADY,) LIQD Take 237 mLs by mouth 2 (two) times daily between meals.    0  . traMADol (ULTRAM) 50 MG tablet Take 1-2 tablets (50-100 mg total) by mouth every 6 (six) hours as needed for moderate pain or severe pain.  40 tablet  0  . triamcinolone cream (KENALOG) 0.1 % Apply 1 application topically 2 (two) times daily. Apply to knee and lower leg.       Fam HX:    Family History  Problem Relation Age of Onset  . Adopted: Yes   Social HX:    History   Social History  . Marital Status: Married    Spouse Name: Fraser Din    Number of Children: 1  . Years of Education: HS   Occupational History  .      Self-Employed   Social History Main Topics  . Smoking status: Former Smoker -- 2.00 packs/day for 31 years    Types: Cigarettes    Quit date: 08/07/1995  . Smokeless tobacco: Never Used     Comment: quit smoking in 1997  . Alcohol Use: No  . Drug Use: No  . Sexual Activity: Yes   Other Topics Concern  . Not on file   Social History Narrative   Patient lives at home with spouse.   Caffiene Use: 1 cup daily     ROS:  All 11  ROS were addressed and are negative except what is stated in the HPI  Physical Exam: Blood pressure 127/72,  pulse 87, temperature 97.5 F (36.4 C), temperature source Oral, resp. rate 14, height 5\' 10"  (1.778 m), weight 180 lb (81.647 kg), SpO2 94.00%.    General: Well developed, well nourished, in no acute distress Head: Eyes PERRLA, No xanthomas.   Normal cephalic and atramatic  Lungs:   Clear bilaterally to auscultation anteriorly Heart:   HRRR S1 S2 Pulses are 2+ & equal. Abdomen: Bowel sounds are positive, abdomen soft and non-tender without masses  Extremities:  1+ edema Neuro: Alert and oriented X 3. Psych:  Good affect, responds appropriately    Labs:   Lab Results  Component Value Date   WBC 8.9 08/06/2013   HGB 12.1* 08/06/2013   HCT 38.6* 08/06/2013   MCV 97.7 08/06/2013   PLT 100* 08/06/2013    Recent Labs Lab 08/06/13 0555 08/06/13 1219  NA 137 137  K 6.4* 4.3  CL 88* 95*  CO2 16* 19  BUN 35* 41*  CREATININE 5.76* 5.77*  5.92*  CALCIUM 9.9 8.6  PROT 7.3  --   BILITOT 1.1  --   ALKPHOS 128*  --   ALT 31  --   AST 81*  --   GLUCOSE 71 89   No results found for this basename: PTT   Lab Results  Component Value Date   INR 1.60* 05/24/2013   INR 2.00* 05/23/2013   INR 2.15* 05/22/2013   Lab Results  Component Value Date   CKTOTAL 154 05/23/2013   TROPONINI 0.94* 08/06/2013     No results found for this basename: CHOL   No results found for this basename: HDL   No results found for this basename: LDLCALC   Lab Results  Component Value Date   TRIG 434* 02/15/2013   No results found for this basename: CHOLHDL   No results found for this basename: LDLDIRECT      Radiology:  Ct Abdomen Pelvis Wo Contrast  08/06/2013   CLINICAL DATA:  Abdominal pain and fever. Recent surgical procedure for peritoneal dialysis 5 days ago.  EXAM: CT ABDOMEN AND PELVIS WITHOUT CONTRAST  TECHNIQUE: Multidetector CT imaging of the abdomen and pelvis was performed  following the standard protocol without intravenous contrast.  COMPARISON:  None.  FINDINGS: Lung bases demonstrate a moderate size right pleural effusion and small left pleural effusion with associated airspace opacification which may be due to atelectasis or infection. There is mild cardiomegaly.  Abdominal images demonstrate evidence of a prior cholecystectomy. There is a small amount of perihepatic fluid. There is mild fatty infiltration of the liver with subtle nodular contour. The pancreas and adrenal glands are unremarkable. Appendix is within normal. Kidneys are normal in size with bilateral renal cortical thinning. There is a 6 cm exophytic simple cyst over the mid pole of the left kidney. There are a few small of bilateral hypodensities likely cysts. There is a 1.1 cm homogeneously hyperdense mass over the lower pole of the left kidney with Hounsfield unit measurements 45 likely a hemorrhagic cyst. Ureters are within normal. There is no hydronephrosis or nephrolithiasis. There is diverticulosis of the colon most prominent over the sigmoid colon. There is a peritoneal dialysis catheter entering the left mid abdomen with tip coiled over the pelvis between loops of rectosigmoid colon. There is moderate calcified plaque of the thoracoabdominal aorta and iliac arteries. Minimal free fluid over the right pericolic gutter. No evidence of free peritoneal air.  Remaining pelvic structures are notable only for moderate fecal retention over the rectosigmoid colon. There  is mild degenerative change of the spine and hips.  IMPRESSION: Moderate size right pleural effusion and small left pleural effusion with associated airspace opacification in the lower lobes which may be due to pneumonia or atelectasis.  Peritoneal dialysis catheter with tip coiled over the pelvis between several recto sigmoid colonic loops.  Changes involving the liver which may be due to cirrhosis. Small amount of ascites.  Bilateral renal cortical  thinning and bilateral renal cysts. 1.1 cm homogeneously hyperdense left renal mass likely a hemorrhagic cyst. Recommend followup CT 6 months.  Diverticulosis of the colon. Moderate fecal retention over the rectosigmoid colon.  Small hiatal hernia.   Electronically Signed   By: Marin Olp M.D.   On: 08/06/2013 07:57   Dg Chest Port 1 View  08/06/2013   CLINICAL DATA:  68 year old male with shortness of breath, fever abdominal pain cyanosis. Initial encounter.  EXAM: PORTABLE CHEST - 1 VIEW  COMPARISON:  08/02/2013 and earlier.  FINDINGS: Portable AP semi upright view at 0612 hrs.  Extubated. Decreased bilateral veiling pulmonary opacity. Improved retrocardiac ventilation. Stable cardiac size and mediastinal contours. No pneumothorax. Stable pulmonary vascularity/interstitial opacity.  IMPRESSION: 1. Extubated. 2. Decreased appearance of bilateral pleural effusions and improved bibasilar ventilation. 3. Continued  pulmonary vascular congestion/interstitial edema.   Electronically Signed   By: Lars Pinks M.D.   On: 08/06/2013 06:31    EKG:  NSR with T wave abnormality in the inferior leads and V6 which is new from EKG 12/2012  ASSESSMENT:  1.  NSTEMI most likely type II from demand ischemia.  He has not had any chest pain.  He does have some new T wave changes compared to prior EKG 12/2012.  Troponin elevation is in the setting of ESRD.  His troponin was normal in 05/2013.  He has no history of CAD in the past but does have a history of PVD.   2.  ESRD now on HD with recent PD catheter placed 3.  Acute on chronic Respiratory failure possibly from HCAP with bilateral pleural effusions 4.  Severe sepsis 5.  Suspected peritonitis 6.  COPD 7. Acute on chronic diastolic CHF with increased SOB and LE edema.  PLAN:  . 1.  Continue to cycle Troponin until it peaks 2.  2D echo in am to assess LVF 3.  Further workup pending results of echo 4.  Volume management per renal with HD  Sueanne Margarita, MD    08/06/2013  7:36 PM

## 2013-08-06 NOTE — ED Notes (Signed)
Ronie Spies, NP from CCM at bedside

## 2013-08-06 NOTE — Progress Notes (Signed)
eLink Physician-Brief Progress Note Patient Name: Victor Lane DOB: [birth year] MRN: [id number]  Date of Service  08/06/2013   HPI/Events of Note   Trop of 0.94 noted. Suspect demand.   eICU Interventions   Cards consult.   Intervention Category Intermediate Interventions: Diagnostic test evaluation  [name] R. 08/06/2013, 6:00 PM

## 2013-08-06 NOTE — ED Notes (Addendum)
Level 1 code sepsis inititated at this time. Verbal order from Dr. Roxanne Mins to do so.

## 2013-08-06 NOTE — ED Notes (Signed)
Patient unable to drink oral contrast- Dr. Roxanne Mins made aware and given verbal order to have patient sent to CT without any contrast. CT staff made aware at this time.

## 2013-08-06 NOTE — Progress Notes (Signed)
eLink Physician-Brief Progress Note Patient Name: Victor Lane DOB: [birth year] MRN: [id number]  Date of Service  08/06/2013   HPI/Events of Note   Pain without PRN med  eICU Interventions   Started on PRN Dilaudid.   Intervention Category Intermediate Interventions: Abdominal pain - evaluation and management  Tammera Engert R. 08/06/2013, 9:27 PM

## 2013-08-06 NOTE — Progress Notes (Signed)
eLink Physician-Brief Progress Note Patient Name: Victor Lane DOB: 1946-03-18 MRN: [id number]  Date of Service  08/06/2013   HPI/Events of Note   Cards consult still hasn't called back.   eICU Interventions   Continuing to page.       [name] R. 08/06/2013, 6:55 PM

## 2013-08-06 NOTE — Consult Note (Addendum)
Extention tubing pulled off of catheter (?!?!?!).  He tells me that no one has touched it.  Awoke w pain last night & fluid "leaking everywhere."  Came in w abd pain.  No N/V.  Hungry.  Makes urine - decreased but "normal"  No hematuria/pyuria/dysuria.  3L IVF given - feeling better    General: Pt awake/alert/oriented x4 in no major acute distress.  Sitting up, chatty Eyes: PERRL, normal EOM. Sclera nonicteric Neuro: CN II-XII intact w/o focal sensory/motor deficits. Lymph: No head/neck/groin lymphadenopathy Psych:  No delerium/psychosis/paranoia HENT: Normocephalic, Mucus membranes moist.  No thrush Neck: Supple, No tracheal deviation Chest: No pain.  Good respiratory excursion. CV:  Pulses intact.  Regular rhythm MS: Normal AROM mjr joints.  No obvious deformity Abdomen: Soft, Mildy distended.   No incarcerated hernias.  Abd TTP c/w peritonitis.  Internal tubing exposed at exit site.  Dressing soaked & external tubing unattached.  Colorless serous w few clots.  No pus Ext:  SCDs BLE.  No significant edema.  No cyanosis Skin: No petechiae / purpura  I replaced new external tubing sterilely.  Catheter flushes easily.  No leak around tube.  No migration.  Pulls at abd wall = cuffs are intact & granualting in.  Flushed w 550mL heparinized NS.  Poor return initially but then improved.  Catheter capped.  ICU RN Rip Harbour helped  Hopefully peritonits will resolve w IV / intraperitoneal ABx.  If fails, then remove catheter in OR  D/w Dr Earnest Conroy with CCM.  D/W Dr Jonnie Finner w [facility]/Nephrology

## 2013-08-06 NOTE — Progress Notes (Signed)
Pt admitted to [facility] room 2.  Pt alert and oriented, placed on the monitor, vital signs done.  C/o pain in abdomen, MD at the bedside to evaluate PD cath site.  Will continue to monitor.

## 2013-08-06 NOTE — Progress Notes (Signed)
eLink Physician-Brief Progress Note Patient Name: Victor Lane DOB: May 03, 1946 MRN: [id number]  Date of Service  08/06/2013   HPI/Events of Note   IVF left at 125 /hr. Edema on exam.   eICU Interventions   Holding IVF.   Intervention Category Intermediate Interventions: Medication change / dose adjustment  [name] R. 08/06/2013, 8:00 PM

## 2013-08-06 NOTE — ED Notes (Signed)
EKG shown to Dr. Roxanne Mins

## 2013-08-06 NOTE — Progress Notes (Signed)
CBG 66 276ml of ginger ale given PO. Will check CBG in 15 min. [facility] MD notified via [place].

## 2013-08-06 NOTE — ED Notes (Signed)
Patient is resting with eyes closed at this time. Respirations even and unlabored. Patient easily awoken- and will answer questions when spoken to. Pt states that his pain is "better" but will not give me a number on the pain scale. No acute distress at this time. Will continue to monitor closely.

## 2013-08-06 NOTE — ED Provider Notes (Signed)
CSN: [id number]     Arrival date & time 08/06/13  0549 History   First MD Initiated Contact with Patient 08/06/13 0557     Chief Complaint  Patient presents with  . Abdominal Pain  . Fever     (Consider location/radiation/quality/duration/timing/severity/associated sxs/prior Treatment) Patient is a 68 y.o. male presenting with abdominal pain and fever. The history is provided by the patient.  Abdominal Pain Associated symptoms: fever   Fever He is 5 days status post placement of peritoneal dialysis catheter. At 2 AM, he woke up with fever, chills, and generalized abdominal pain. There's been no nausea or vomiting. He denies cough denies diarrhea. Of note, he still does make urine. He has been on hemodialysis for approximately 4-5 months. His pain is severe. He rates it 10/10. It is worse with palpation. Nothing makes it better.  Past Medical History  Diagnosis Date  . Chest pain     non-ischemic stress test 02/2011 (Dr. Radford Pax)  . Carotid artery occlusion   . Neuropathy   . Arthritis   . H/O vitamin D deficiency   . Hearing loss   . Hypercholesteremia   . 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 S/P radiation therapy 01/24/2013-03/14/2013    Larynx/glottis / 63 Gy in 28 fractions  . Hypertension     takes Metoprolol daily  . GERD (gastroesophageal reflux disease)     takes Nexium daily  . Complication of anesthesia     hard to wake up and then panics waking up  . Family history of anesthesia complication     Adopted  . Sleep apnea     to request from Dr.Fried was done about 72yrs ago but no cpap d/t weight loss  . Pneumonia 01/2013  . Weakness     over whole body  . Fibromyalgia     takes tramadol daily as needed  . Myalgia and myositis   . Peripheral neuropathy     was on Lyrica but has been off x a week(Was only on for 3days)  . History of gout   . Rash     both lower legs and feet and using Mupirocin  . History  of colon polyps   . Chronic kidney disease     - Stage 5- follwed by Dr Sharlotte Alamo in [place]  . Diabetes mellitus     takes Metformin when needed d/t weight loss  . Cataracts, bilateral     immature  . COPD (chronic obstructive pulmonary disease)    Past Surgical History  Procedure Laterality Date  . Cholecystectomy  1993  . Rotator cuff repair  07/06/2011    right  . Carotid endarterectomy Left   . Coolonoscopy    . Colonoscopy w/ biopsies and polypectomy      Hx: of  . Microlaryngoscopy with laser N/A 01/04/2013    Procedure: MICROLARYNGOSCOPY WITH BIOPSY/LASER;  Surgeon: [name], MD;  Location: [place];  Service: ENT;  Laterality: N/A;  . Av fistula placement Left 02/23/2013    Procedure: ARTERIOVENOUS (AV) FISTULA CREATION;  Surgeon: Conrad Branson, MD;  Location: [place];  Service: Vascular;  Laterality: Left;  . Capd insertion N/A 08/01/2013    Procedure: LAPAROSCOPIC INSERTION CONTINUOUS AMBULATORY PERITONEAL DIALYSIS CATHETER;  Surgeon: [name], MD;  Location: [place];  Service: General;  Laterality: N/A;  . Laparoscopic abdominal exploration N/A 08/01/2013    Procedure: LAPAROSCOPIC ABDOMINAL EXPLORATION;  Surgeon: [name], MD;  Location: [facility] OR;  Service: General;  Laterality: N/A;   Family History  Problem Relation Age of Onset  . Adopted: Yes   History  Substance Use Topics  . Smoking status: Former Smoker -- 2.00 packs/day for 31 years    Types: Cigarettes  . Smokeless tobacco: Never Used     Comment: quit smoking in 1997  . Alcohol Use: No    Review of Systems  Constitutional: Positive for fever.  Gastrointestinal: Positive for abdominal pain.  All other systems reviewed and are negative.      Allergies  Amoxicillin and Fentanyl  Home Medications   Current Outpatient Rx  Name  Route  Sig  Dispense  Refill  . aspirin 81 MG tablet   Oral   Take 81 mg by mouth daily.          . calcium acetate (PHOSLO) 667 MG capsule   Oral   Take  1,334 mg by mouth 3 (three) times daily with meals.         . diphenhydrAMINE (BENADRYL) 25 MG tablet   Oral   Take 50 mg by mouth every 6 (six) hours as needed.         Marland Kitchen esomeprazole (NEXIUM) 40 MG capsule   Oral   Take 40 mg by mouth daily before breakfast.         . glipiZIDE (GLUCOTROL XL) 2.5 MG 24 hr tablet   Oral   Take 2.5 mg by mouth daily as needed (diabetes).         . metoprolol tartrate (LOPRESSOR) 25 MG tablet   Oral   Take 0.5 tablets (12.5 mg total) by mouth 2 (two) times daily.         . multivitamin (RENA-VIT) TABS tablet   Oral   Take 1 tablet by mouth daily.          . Nutritional Supplements (FEEDING SUPPLEMENT, NEPRO CARB STEADY,) LIQD   Oral   Take 237 mLs by mouth 2 (two) times daily between meals.      0   . oxyCODONE (OXY IR/ROXICODONE) 5 MG immediate release tablet   Oral   Take 1-2 tablets (5-10 mg total) by mouth every 4 (four) hours as needed for moderate pain.   30 tablet   0   . traMADol (ULTRAM) 50 MG tablet   Oral   Take 1-2 tablets (50-100 mg total) by mouth every 6 (six) hours as needed for moderate pain or severe pain.   40 tablet   0   . triamcinolone cream (KENALOG) 0.1 %   Topical   Apply 1 application topically 2 (two) times daily. Apply to knee and lower leg.          BP 149/129  Pulse 110  Temp(Src) 101.7 F (38.7 C) (Rectal)  Resp 35  SpO2 94% Physical Exam  Nursing note and vitals reviewed.  Ill appearing 68 year old male in no acute respiratory distress. Vital signs are significant for tachycardia with heart rate 110, fever with temperature 101.7, tachypnea with respiratory rate of 35, and hypertension with blood pressure 149/129. Oxygen saturation is 94%, which is normal. Head is normocephalic and atraumatic. PERRLA, EOMI. Oropharynx is clear. Dried blood is present in the oral cavity and he is uncertain how it got there. Neck is nontender and supple without adenopathy or JVD. Back is nontender and  there is no CVA tenderness. Lungs are clear without rales, wheezes, or rhonchi.  Chest is nontender. Heart has regular rate and rhythm without murmur. Abdomen is soft, flat, with severe diffuse tenderness. There is no rebound or guarding. Peritoneal dialysis catheter is in place. There are no masses or hepatosplenomegaly and peristalsis is hypoactive. Extremities have no cyanosis or edema, full range of motion is present. AV shunt is present in the left upper arm with bruit present. Skin has significant mottling in the dependent areas. Neurologic: Mental status is normal, cranial nerves are intact, there are no motor or sensory deficits.  ED Course  Procedures (including critical care time) Labs Review Results for orders placed during the hospital encounter of 08/06/13  CBC WITH DIFFERENTIAL      Result Value Ref Range   WBC 5.9  4.0 - 10.5 K/uL   RBC 4.45  4.22 - 5.81 MIL/uL   Hemoglobin 14.0  13.0 - 17.0 g/dL   HCT 44.8  39.0 - 52.0 %   MCV 100.7 (*) 78.0 - 100.0 fL   MCH 31.5  26.0 - 34.0 pg   MCHC 31.3  30.0 - 36.0 g/dL   RDW 19.5 (*) 11.5 - 15.5 %   Platelets 192  150 - 400 K/uL   Neutrophils Relative % 71  43 - 77 %   Neutro Abs 4.1  1.7 - 7.7 K/uL   Lymphocytes Relative 22  12 - 46 %   Lymphs Abs 1.3  0.7 - 4.0 K/uL   Monocytes Relative 7  3 - 12 %   Monocytes Absolute 0.4  0.1 - 1.0 K/uL   Eosinophils Relative 0  0 - 5 %   Eosinophils Absolute 0.0  0.0 - 0.7 K/uL   Basophils Relative 0  0 - 1 %   Basophils Absolute 0.0  0.0 - 0.1 K/uL  COMPREHENSIVE METABOLIC PANEL      Result Value Ref Range   Sodium 137  137 - 147 mEq/L   Potassium 6.4 (*) 3.7 - 5.3 mEq/L   Chloride 88 (*) 96 - 112 mEq/L   CO2 16 (*) 19 - 32 mEq/L   Glucose, Bld 71  70 - 99 mg/dL   BUN 35 (*) 6 - 23 mg/dL   Creatinine, Ser 5.76 (*) 0.50 - 1.35 mg/dL   Calcium 9.9  8.4 - 10.5 mg/dL   Total Protein 7.3  6.0 - 8.3 g/dL   Albumin 3.2 (*) 3.5 - 5.2 g/dL   AST 81 (*) 0 - 37 U/L   ALT 31  0 - 53 U/L    Alkaline Phosphatase 128 (*) 39 - 117 U/L   Total Bilirubin 1.1  0.3 - 1.2 mg/dL   GFR calc non Af Amer 9 (*) >90 mL/min   GFR calc Af Amer 11 (*) >90 mL/min  PROCALCITONIN      Result Value Ref Range   Procalcitonin 0.82    CG4 I-STAT (LACTIC ACID)      Result Value Ref Range   Lactic Acid, Venous 12.77 (*) 0.5 - 2.2 mmol/L   Imaging Review Ct Abdomen Pelvis Wo Contrast  08/06/2013   CLINICAL DATA:  Abdominal pain and fever. Recent surgical procedure for peritoneal dialysis 5 days ago.  EXAM: CT ABDOMEN AND PELVIS WITHOUT CONTRAST  TECHNIQUE: Multidetector CT imaging of the abdomen and pelvis was performed following the standard protocol without intravenous contrast.  COMPARISON:  None.  FINDINGS: Lung bases demonstrate a moderate size right pleural effusion and small left pleural effusion with associated airspace opacification which may be due to atelectasis or  infection. There is mild cardiomegaly.  Abdominal images demonstrate evidence of a prior cholecystectomy. There is a small amount of perihepatic fluid. There is mild fatty infiltration of the liver with subtle nodular contour. The pancreas and adrenal glands are unremarkable. Appendix is within normal. Kidneys are normal in size with bilateral renal cortical thinning. There is a 6 cm exophytic simple cyst over the mid pole of the left kidney. There are a few small of bilateral hypodensities likely cysts. There is a 1.1 cm homogeneously hyperdense mass over the lower pole of the left kidney with Hounsfield unit measurements 45 likely a hemorrhagic cyst. Ureters are within normal. There is no hydronephrosis or nephrolithiasis. There is diverticulosis of the colon most prominent over the sigmoid colon. There is a peritoneal dialysis catheter entering the left mid abdomen with tip coiled over the pelvis between loops of rectosigmoid colon. There is moderate calcified plaque of the thoracoabdominal aorta and iliac arteries. Minimal free fluid  over the right pericolic gutter. No evidence of free peritoneal air.  Remaining pelvic structures are notable only for moderate fecal retention over the rectosigmoid colon. There is mild degenerative change of the spine and hips.  IMPRESSION: Moderate size right pleural effusion and small left pleural effusion with associated airspace opacification in the lower lobes which may be due to pneumonia or atelectasis.  Peritoneal dialysis catheter with tip coiled over the pelvis between several recto sigmoid colonic loops.  Changes involving the liver which may be due to cirrhosis. Small amount of ascites.  Bilateral renal cortical thinning and bilateral renal cysts. 1.1 cm homogeneously hyperdense left renal mass likely a hemorrhagic cyst. Recommend followup CT 6 months.  Diverticulosis of the colon. Moderate fecal retention over the rectosigmoid colon.  Small hiatal hernia.   Electronically Signed   By: Marin Olp M.D.   On: 08/06/2013 07:57   Dg Chest Port 1 View  08/06/2013   CLINICAL DATA:  68 year old male with shortness of breath, fever abdominal pain cyanosis. Initial encounter.  EXAM: PORTABLE CHEST - 1 VIEW  COMPARISON:  08/02/2013 and earlier.  FINDINGS: Portable AP semi upright view at 0612 hrs.  Extubated. Decreased bilateral veiling pulmonary opacity. Improved retrocardiac ventilation. Stable cardiac size and mediastinal contours. No pneumothorax. Stable pulmonary vascularity/interstitial opacity.  IMPRESSION: 1. Extubated. 2. Decreased appearance of bilateral pleural effusions and improved bibasilar ventilation. 3. Continued  pulmonary vascular congestion/interstitial edema.   Electronically Signed   By: Lars Pinks M.D.   On: 08/06/2013 06:31    EKG Interpretation    Date/Time:  Sunday August 06 2013 07:48:54 EST Ventricular Rate:  97 PR Interval:  161 QRS Duration: 107 QT Interval:  378 QTC Calculation: 480 R Axis:   93 Text Interpretation:  Sinus rhythm Probable left atrial enlargement  Right axis deviation Borderline low voltage, extremity leads Abnormal R-wave progression, late transition Borderline prolonged QT interval When compared with ECG of 05/22/2013, QT has shortened Confirmed by Roxanne Mins  MD, Carylon Tamburro ([id number]) on 08/06/2013 7:55:47 AM           CRITICAL CARE Performed by: [id number] Total critical care time: 120 minutes Critical care time was exclusive of separately billable procedures and treating other patients. Critical care was necessary to treat or prevent imminent or life-threatening deterioration. Critical care was time spent personally by me on the following activities: development of treatment plan with patient and/or surrogate as well as nursing, discussions with consultants, evaluation of patient's response to treatment, examination of patient, obtaining history from patient or  surrogate, ordering and performing treatments and interventions, ordering and review of laboratory studies, ordering and review of radiographic studies, pulse oximetry and re-evaluation of patient's condition.'  MDM   Final diagnoses:  Sepsis  Abdominal pain  ESRD on dialysis    Fever and abdominal pain worrisome for possible complication of insertion of perineal dialysis catheter. Code sepsis protocol was initiated and he is started on antibiotics have been started on early goal-directed fluid therapy.   With fluid bolus, heart rate has come down but he remains febrile. Lactic acid level is come back extremely high. Initial chest x-ray shows no evidence of free air. I'm still worried about the recent placement of perineal dialysis catheter in possible bowel injury so he will be sent for a CT scan.  CT shows no evidence of acute intra-abdominal process. Lactic acid level will be rechecked to see if there's been any significant clearance. Case is discussed with the critical care medicine who state they will come and evaluate the patient for possible admission to ICU.  Delora Fuel,  MD [id number]

## 2013-08-06 NOTE — H&P (Signed)
PULMONARY / CRITICAL CARE MEDICINE  Name: Victor Lane MRN: [id number] DOB: 11-Mar-1946    ADMISSION DATE:  08/06/2013  REFERRING MD :  EDP  PRIMARY SERVICE: PCCM   CHIEF COMPLAINT:  Sepsis   BRIEF PATIENT DESCRIPTION: 68 year old with extensive PMH including severe cardiovascular disease, ESRD  and vocal cord cancer , critical illness with resp failure 01/2013 w/ trach presenting to the ER 2/15  for abdominal pain, fever, weakness with suspected Sepsis . PCCM called for admission . Discharged from hospital 2/11 after Peritoneal Dialysis cath placed.   SIGNIFICANT EVENTS / STUDIES:  2/10 Peritoneal Dialysis Cath placed  2/15 CT abd/pelvis >>> Moderate size right pleural effusion and small left pleural effusion ,bibasilar airspace disease, small ascites, PD cath ,no acute abd findings, ?cirrhosis   LINES / TUBES: PD cath 2/10 >>>  CULTURES: 2/15  BC >>> 2/15  UC >>> 2/15  Viral panel >> 2/15  Peritoneal >>>  ANTIBIOTICS: Levaquin x 1 Aztreonam 2/15 >>> Flagyl 2/15 >>> Vancomycin 2/15 >>>  HISTORY OF PRESENT ILLNESS:  68 year old with extensive PMH including severe cardiovascular disease, ESRD  and vocal cord cancer presenting to the ER for abdominal pain, fever, weakness with suspected Sepsis . PCCM called for admission .   Victor Lane is a 68 y.o. male with stage 4 CKD with HD on M/W/F, w/  PD catheter placement on 2/10 . Presented to ER on 2/15 am with fever, abd pain , weakness. B/P marginal ~106, tachycardia. Lactate 13 . Felt to have SIRS/Sepsis . CT abd /pelvis showed mod  right pleural effusion , bibasilar aspdz. No acute abd process noted.  PCCM was asked to admit. Was admitted 2/10 for a  PD cath placement,  had difficulty post op with inability to protect airway w/ reintubation requiring vent support overnight, He received HD and was extubated w/ discharge home on 2/11.  In ER Pt was given 2 L/ bolus w/ improved b/p and decreased HR.  Pt has an extensive hx with  prolonged critical illness 01/2013 w/ Acute resp failrue requiring trach now decannulated.  He lives at home with wife who is also ill.  Says he has been very weak at home , fell yesterday after tripping on shoes.  Says he is short of breath w/ no energy, woke up with abd pain. PD cath was draining clear liquid. Has had chills and fever since yesterday. Has diffuse abd pain.  He will be admitted to ICU for further treatment and evaluation   PAST MEDICAL HISTORY :  Past Medical History  Diagnosis Date  . Chest pain     non-ischemic stress test 02/2011 (Dr. Radford Pax)  . Carotid artery occlusion   . Neuropathy   . Arthritis   . H/O vitamin D deficiency   . Hearing loss   . Hypercholesteremia   . 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 S/P radiation therapy 01/24/2013-03/14/2013    Larynx/glottis / 63 Gy in 28 fractions  . Hypertension     takes Metoprolol daily  . GERD (gastroesophageal reflux disease)     takes Nexium daily  . Complication of anesthesia     hard to wake up and then panics waking up  . Family history of anesthesia complication     Adopted  . Sleep apnea     to request from Dr.Fried was done about 33yrs ago but no cpap d/t weight  loss  . Pneumonia 01/2013  . Weakness     over whole body  . Fibromyalgia     takes tramadol daily as needed  . Myalgia and myositis   . Peripheral neuropathy     was on Lyrica but has been off x a week(Was only on for 3days)  . History of gout   . Rash     both lower legs and feet and using Mupirocin  . History of colon polyps   . Chronic kidney disease     - Stage 5- follwed by Dr Sharlotte Alamo in [place]  . Diabetes mellitus     takes Metformin when needed d/t weight loss  . Cataracts, bilateral     immature  . COPD (chronic obstructive pulmonary disease)    Past Surgical History  Procedure Laterality Date  . Cholecystectomy  1993  . Rotator cuff repair  07/06/2011     right  . Carotid endarterectomy Left   . Coolonoscopy    . Colonoscopy w/ biopsies and polypectomy      Hx: of  . Microlaryngoscopy with laser N/A 01/04/2013    Procedure: MICROLARYNGOSCOPY WITH BIOPSY/LASER;  Surgeon: [name], MD;  Location: [facility];  Service: ENT;  Laterality: N/A;  . Av fistula placement Left 02/23/2013    Procedure: ARTERIOVENOUS (AV) FISTULA CREATION;  Surgeon: Conrad Haskins, MD;  Location: [facility];  Service: Vascular;  Laterality: Left;  . Capd insertion N/A 08/01/2013    Procedure: LAPAROSCOPIC INSERTION CONTINUOUS AMBULATORY PERITONEAL DIALYSIS CATHETER;  Surgeon: [name], MD;  Location: [facility];  Service: General;  Laterality: N/A;  . Laparoscopic abdominal exploration N/A 08/01/2013    Procedure: LAPAROSCOPIC ABDOMINAL EXPLORATION;  Surgeon: [name], MD;  Location: [facility];  Service: General;  Laterality: N/A;   Prior to Admission medications   Medication Sig Start Date End Date Taking? Authorizing Provider  aspirin 81 MG tablet Take 81 mg by mouth daily.    Yes Historical Provider, MD  calcium acetate (PHOSLO) 667 MG capsule Take 1,334 mg by mouth 3 (three) times daily with meals.   Yes Historical Provider, MD  diphenhydrAMINE (BENADRYL) 25 MG tablet Take 50 mg by mouth every 6 (six) hours as needed for itching.    Yes Historical Provider, MD  esomeprazole (NEXIUM) 40 MG capsule Take 40 mg by mouth daily before breakfast.   Yes Historical Provider, MD  glipiZIDE (GLUCOTROL XL) 2.5 MG 24 hr tablet Take 2.5 mg by mouth daily as needed (diabetes).   Yes Historical Provider, MD  metoprolol tartrate (LOPRESSOR) 25 MG tablet Take 0.5 tablets (12.5 mg total) by mouth 2 (two) times daily. 02/25/13  Yes Tammy S Parrett, NP  multivitamin (RENA-VIT) TABS tablet Take 1 tablet by mouth daily.    Yes Historical Provider, MD  Nutritional Supplements (FEEDING SUPPLEMENT, NEPRO CARB STEADY,) LIQD Take 237 mLs by mouth 2 (two) times daily between meals. 05/24/13  Yes Barton Dubois,  MD  traMADol (ULTRAM) 50 MG tablet Take 1-2 tablets (50-100 mg total) by mouth every 6 (six) hours as needed for moderate pain or severe pain. 08/01/13  Yes [name], MD  triamcinolone cream (KENALOG) 0.1 % Apply 1 application topically 2 (two) times daily. Apply to knee and lower leg. 07/18/13  Yes Historical Provider, MD   Allergies  Allergen Reactions  . Amoxicillin     Causes upset stomach  . Fentanyl     Confusion when patch applied    FAMILY HISTORY:  Family History  Problem Relation Age of Onset  . Adopted: Yes   SOCIAL HISTORY:  reports that he has quit smoking. His smoking use included Cigarettes. He has a 62 pack-year smoking history. He has never used smokeless tobacco. He reports that he does not drink alcohol or use illicit drugs.  REVIEW OF SYSTEMS:   Constitutional:   No  weight loss, night sweats,  ++ Fevers, chills, fatigue, or  lassitude.  HEENT:   No headaches,  Difficulty swallowing,  Tooth/dental problems, or  Sore throat,                No sneezing, itching, ear ache, nasal congestion, post nasal drip,   CV:  No chest pain,  Orthopnea, PND, swelling in lower extremities, anasarca, dizziness, palpitations, syncope.   GI  ++diffuse  abdominal pain, loss of appetite  NO  vomiting, diarrhea, change in bowel habits  bloody stools.   Resp: ++ shortness of breath with exertion or at rest.   No excess mucus, no productive cough,  No non-productive cough,  No coughing up of blood.  No change in color of mucus.  No wheezing.  No chest wall deformity  Skin: no rash or lesions.  GU: no dysuria,   No flank pain, no hematuria   MS:  No joint pain or swelling.  No decreased range of motion.  No back pain.  Psych:  No change in mood or affect. No depression or anxiety.  No memory loss.  INTERVAL HISTORY:  VITAL SIGNS: Temp:  [101.7 F (38.7 C)-101.9 F (38.8 C)] 101.9 F (38.8 C) (02/15 0743) Pulse Rate:  [89-110] 97 (02/15 0740) Resp:  [22-35] 24 (02/15  0740) BP: (106-149)/(52-129) 110/52 mmHg (02/15 0740) SpO2:  [91 %-98 %] 95 % (02/15 0740)  HEMODYNAMICS:   INTAKE / OUTPUT: Intake/Output     02/14 0701 - 02/15 0700 02/15 0701 - 02/16 0700   I.V. 1000 2000   Total Intake 1000 2000   Net +1000 +2000          PHYSICAL EXAMINATION: General:  Chronically ill appearing male  Neuro:  A/o x 3  HEENT:  Dry mucosa , dried blood on tongue Cardiovascular:  ST , no m/r/g  Lungs:  Diminshed BS in bases , no wheezing  Abdomen:  gen tenderness, hypoactive BS , PD cath w/ dsg in tact, rebound tenderness, drain / catheter tip protruding form the wound Musculoskeletal:  maew x 4 , no swelling noted.  Skin:  Intact   LABS:  CBC  Recent Labs Lab 08/01/13 0943 08/02/13 0220 08/06/13 0555  WBC  --  5.5 5.9  HGB 16.0 13.0 14.0  HCT 47.0 41.0 44.8  PLT  --  147* 192   Coag's No results found for this basename: APTT, INR,  in the last 168 hours BMET  Recent Labs Lab 08/01/13 0943 08/02/13 0220 08/06/13 0555  NA 138 141 137  K 4.1 3.6* 6.4*  CL  --  96 88*  CO2  --  25 16*  BUN  --  48* 35*  CREATININE  --  6.26* 5.76*  GLUCOSE 94 84 71   Electrolytes  Recent Labs Lab 08/02/13 0220 08/06/13 0555  CALCIUM 8.6 9.9  MG 2.1  --   PHOS 5.3*  --    Sepsis Markers  Recent Labs Lab 08/06/13 0555 08/06/13 0614 08/06/13 0819  LATICACIDVEN  --  12.77* 6.34*  PROCALCITON 0.82  --   --    ABG  Recent Labs Lab 08/01/13 1737 08/02/13 0333  PHART 7.445 7.548*  PCO2ART 41.2 31.6*  PO2ART 74.0* 69.0*   Liver Enzymes  Recent Labs Lab 08/06/13 0555  AST 81*  ALT 31  ALKPHOS 128*  BILITOT 1.1  ALBUMIN 3.2*   Cardiac Enzymes No results found for this basename: TROPONINI, PROBNP,  in the last 168 hours Glucose  Recent Labs Lab 08/01/13 1910 08/01/13 2002 08/02/13 0034 08/02/13 0450 08/02/13 0542 08/02/13 0747  GLUCAP 58* 101* 71 69* 105* 77   Imaging Ct Abdomen Pelvis Wo Contrast  08/06/2013    CLINICAL DATA:  Abdominal pain and fever. Recent surgical procedure for peritoneal dialysis 5 days ago.  EXAM: CT ABDOMEN AND PELVIS WITHOUT CONTRAST  TECHNIQUE: Multidetector CT imaging of the abdomen and pelvis was performed following the standard protocol without intravenous contrast.  COMPARISON:  None.  FINDINGS: Lung bases demonstrate a moderate size right pleural effusion and small left pleural effusion with associated airspace opacification which may be due to atelectasis or infection. There is mild cardiomegaly.  Abdominal images demonstrate evidence of a prior cholecystectomy. There is a small amount of perihepatic fluid. There is mild fatty infiltration of the liver with subtle nodular contour. The pancreas and adrenal glands are unremarkable. Appendix is within normal. Kidneys are normal in size with bilateral renal cortical thinning. There is a 6 cm exophytic simple cyst over the mid pole of the left kidney. There are a few small of bilateral hypodensities likely cysts. There is a 1.1 cm homogeneously hyperdense mass over the lower pole of the left kidney with Hounsfield unit measurements 45 likely a hemorrhagic cyst. Ureters are within normal. There is no hydronephrosis or nephrolithiasis. There is diverticulosis of the colon most prominent over the sigmoid colon. There is a peritoneal dialysis catheter entering the left mid abdomen with tip coiled over the pelvis between loops of rectosigmoid colon. There is moderate calcified plaque of the thoracoabdominal aorta and iliac arteries. Minimal free fluid over the right pericolic gutter. No evidence of free peritoneal air.  Remaining pelvic structures are notable only for moderate fecal retention over the rectosigmoid colon. There is mild degenerative change of the spine and hips.  IMPRESSION: Moderate size right pleural effusion and small left pleural effusion with associated airspace opacification in the lower lobes which may be due to pneumonia or  atelectasis.  Peritoneal dialysis catheter with tip coiled over the pelvis between several recto sigmoid colonic loops.  Changes involving the liver which may be due to cirrhosis. Small amount of ascites.  Bilateral renal cortical thinning and bilateral renal cysts. 1.1 cm homogeneously hyperdense left renal mass likely a hemorrhagic cyst. Recommend followup CT 6 months.  Diverticulosis of the colon. Moderate fecal retention over the rectosigmoid colon.  Small hiatal hernia.   Electronically Signed   By: Marin Olp M.D.   On: 08/06/2013 07:57   Dg Chest Port 1 View  08/06/2013   CLINICAL DATA:  68 year old male with shortness of breath, fever abdominal pain cyanosis. Initial encounter.  EXAM: PORTABLE CHEST - 1 VIEW  COMPARISON:  08/02/2013 and earlier.  FINDINGS: Portable AP semi upright view at 0612 hrs.  Extubated. Decreased bilateral veiling pulmonary opacity. Improved retrocardiac ventilation. Stable cardiac size and mediastinal contours. No pneumothorax. Stable pulmonary vascularity/interstitial opacity.  IMPRESSION: 1. Extubated. 2. Decreased appearance of bilateral pleural effusions and improved bibasilar ventilation. 3. Continued  pulmonary vascular congestion/interstitial edema.   Electronically Signed   By: Lars Pinks M.D.   On: 08/06/2013  06:31   ASSESSMENT / PLAN:  PULMONARY A:  Acute on chronic respiratory failure Possible HCAP Bilateral pleural effusions COPD on nocturnal O2  w/out exacerbation  P:   Goal SpO2>92 Supplemental oxygen PRN  Albuterol / Ipratropium   CARDIOVASCULAR A:  Severe sepsis Chronic diastolic heart failure Hx HTN P:  Goal MAP > 65 Trend lactate / troponin Hold ASA, Lopressor  RENAL A:   ESRD on HD - MWF Hyperkalemia  Lactic acidosis  P:   Consult Renal  Trend BMP  GASTROINTESTINAL A:   Suspected peritonitis GERD P:   NPO Preadmission Protonix Consult Surgery  HEMATOLOGIC A:  VTE Px  P:  Trend CBC Heparin  INFECTIOUS A:    Suspected peritonitis Suspected HCAP PCN allergy P:   Cx / abx as above  ENDOCRINE A:     DM  P:   SSI  Hold Glipizide  NEUROLOGIC A:   Pain P:   Morphine PRN  PARRETT,TAMMY NP-C  Pulmonary and Critical Care Medicine [facility] Pager: [phone]  08/06/2013, 8:23 AM  I have personally obtained history, examined patient, evaluated and interpreted laboratory and imaging results, reviewed medical records, formulated assessment / plan and placed orders.  CRITICAL CARE:  The patient is critically ill with multiple organ systems failure and requires high complexity decision making for assessment and support, frequent evaluation and titration of therapies, application of advanced monitoring technologies and extensive interpretation of multiple databases. Critical Care Time devoted to patient care services described in this note is 45 minutes.   Doree Fudge, MD Pulmonary and [place] Pager: [phone]  08/06/2013, 12:12 PM

## 2013-08-06 NOTE — Consult Note (Signed)
[facility] Renal Consultation Note  Indication for Consultation:  Management of ESRD/hemodialysis; anemia, hypertension/volume and secondary hyperparathyroidism  HPI: Victor Lane is a 68 y.o. 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 Michaell Cowing on 08/01/13, but last night awoke with severe abdominal pain, chills, and weakness and presented to the ED with fever of 101.9.  After PD catheter placement he required reintubation in PACU due to inability to protect the airway, but was extubated the next day and released.  He reports no problems or complaints, other than light drainage from the catheter site, until last night, at which time he had significant drainage with other symptoms.  Antibiotics were started, and General Surgery was consulted.   He had Wednesday dialysis while he was in the hospital and reached his dry weight during outpatient treatment yesterday.  However, his chest x-ray today suggests pulmonary edema and his potassium is elevated at 6.4, so he will require dialysis today.  Dialysis Orders:   MWF @ [facility] 4 hrs      79 kg      400/A1.5        2K/2.25Ca       Heparin 2400 U      AVF @ LUA   Hectorol 5 mcg         Epogen 0         Venofer 0  Chart Review  03/2011 > L carotid endarterectomy, DM, CKD, GERD, HTN   12/2012 > hoarseness > ENT (Dr Jearld Fenton) did laryngoscopy with biopsy and laser removal of friable mass L post VC. R VC polyp biopsied and also removed. Bx + for invasive SCCa and pt started on XRT   8/22 > 02/25/13 admitted with gen weakness, had been receiving XRT as above, found unresponsive, SOB, orthopnea. Eval showed acute on CKD, Creat 11 , known ckd due to DM. Rec'd acute HD after permcath placement. Treated for asp PNA  also w bipap but required intubation. Difficult intubation due to hx of VC cancer. ENT recommended trach and this was doen 8/26. D/C'd from vent eventually on 8/27. FEES showed severe pharyngeal dysphagia and aspiration. PEG tube placed 8/29. Treated for afib with RVR as well during this time. Janina Mayo was downsized due to need for trach to be capped so that he could be eligible for outpatient dialysis (infection control policy). Repeat swallow study was much improved and [id number] soft diet was started. AV fistula was done by [facility] on 9/3. At d/c he was to have trach capped during the entire day on room air. He was getting nutrition orally w PEG supplements as needed. He was deemed not to be a candidate for decannulation per d/c notes. MTP for afib, was in NSR at discharge. ESRD with HD 3x/wk. DM controlled with diet.   12/1-12/3/14 Admitted with falling, weak, nausea, abd pain, constipation. Fallen multiple times in past few weeks. ED eval with transaminases ~ 3000, normal tbili. Hyperkalemia 6.3. Hx cholecystectomy. Admitted. GI eval was neg (normal ana, antiSM ab, hep panel neg, CK neg, tylenol neg), LFT's improving at discharge, no symptoms. 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 MWF HD.   2/10- 08/02/13 Elective  PD cath placmeent, Dr Johney Maine on 2/10. Extubated then reintubated for resp failure due to inability to protect airway. CXR showed pulm edema. Pt was extubated, dialyzed (UF 1kg, 80 > 79kg, BP's up) and discharged after HD after seen by surgery.    Past Medical History  Diagnosis Date  . Chest pain     non-ischemic stress test 02/2011 (Dr. Radford Pax)  . Carotid artery occlusion   . Neuropathy   . Arthritis   . H/O vitamin D deficiency   . Hearing loss   . Hypercholesteremia   . 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 S/P radiation therapy  01/24/2013-03/14/2013    Larynx/glottis / 63 Gy in 28 fractions  . Hypertension     takes Metoprolol daily  . GERD (gastroesophageal reflux disease)     takes Nexium daily  . Complication of anesthesia     hard to wake up and then panics waking up  . Family history of anesthesia complication     Adopted  . Sleep apnea     to request from Dr.Fried was done about 84yrs ago but no cpap d/t weight loss  . Pneumonia 01/2013  . Weakness     over whole body  . Fibromyalgia     takes tramadol daily as needed  . Myalgia and myositis   . Peripheral neuropathy     was on Lyrica but has been off x a week(Was only on for 3days)  . History of gout   . Rash     both lower legs and feet and using Mupirocin  . History of colon polyps   . Chronic kidney disease     - Stage 5- follwed by Dr Sharlotte Alamo in [place]  . Diabetes mellitus     takes Metformin when needed d/t weight loss  . Cataracts, bilateral     immature  . COPD (chronic obstructive pulmonary disease)    Past Surgical History  Procedure Laterality Date  . Cholecystectomy  1993  . Rotator cuff repair  07/06/2011    right  . Carotid endarterectomy Left   . Coolonoscopy    . Colonoscopy w/ biopsies and polypectomy      Hx: of  . Microlaryngoscopy with laser N/A 01/04/2013    Procedure: MICROLARYNGOSCOPY WITH BIOPSY/LASER;  Surgeon: [name], MD;  Location: [place];  Service: ENT;  Laterality: N/A;  . Av fistula placement Left 02/23/2013    Procedure: ARTERIOVENOUS (AV) FISTULA CREATION;  Surgeon: Conrad [place], MD;  Location: [place];  Service: Vascular;  Laterality: Left;  . Capd insertion N/A 08/01/2013    Procedure: LAPAROSCOPIC INSERTION CONTINUOUS AMBULATORY PERITONEAL DIALYSIS CATHETER;  Surgeon: [name], MD;  Location: [place];  Service: General;  Laterality: N/A;  . Laparoscopic abdominal exploration N/A 08/01/2013    Procedure: LAPAROSCOPIC ABDOMINAL EXPLORATION;  Surgeon: [name], MD;  Location: [facility];  Service:  General;  Laterality: N/A;   Family History  Problem Relation Age of Onset  . Adopted: Yes   Social History  He quit smoking cigarettes (as much as 2 packs a day) in 1997 and denies any alcohol or illicit drug use. 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  Allergies  Allergen Reactions  . Amoxicillin     Causes upset stomach  . Fentanyl     Confusion when patch applied  Prior to Admission medications   Medication Sig Start Date End Date Taking? Authorizing Provider  aspirin 81 MG tablet Take 81 mg by mouth daily.    Yes Historical Provider, MD  calcium acetate (PHOSLO) 667 MG capsule Take 1,334 mg by mouth 3 (three) times daily with meals.   Yes Historical Provider, MD  diphenhydrAMINE (BENADRYL) 25 MG tablet Take 50 mg by mouth every 6 (six) hours as needed for itching.    Yes Historical Provider, MD  esomeprazole (NEXIUM) 40 MG capsule Take 40 mg by mouth daily before breakfast.   Yes Historical Provider, MD  glipiZIDE (GLUCOTROL XL) 2.5 MG 24 hr tablet Take 2.5 mg by mouth daily as needed (diabetes).   Yes Historical Provider, MD  metoprolol tartrate (LOPRESSOR) 25 MG tablet Take 0.5 tablets (12.5 mg total) by mouth 2 (two) times daily. 02/25/13  Yes Tammy S Parrett, NP  multivitamin (RENA-VIT) TABS tablet Take 1 tablet by mouth daily.    Yes Historical Provider, MD  Nutritional Supplements (FEEDING SUPPLEMENT, NEPRO CARB STEADY,) LIQD Take 237 mLs by mouth 2 (two) times daily between meals. 05/24/13  Yes Barton Dubois, MD  traMADol (ULTRAM) 50 MG tablet Take 1-2 tablets (50-100 mg total) by mouth every 6 (six) hours as needed for moderate pain or severe pain. 08/01/13  Yes [name], MD  triamcinolone cream (KENALOG) 0.1 % Apply 1 application topically 2 (two) times daily. Apply to knee and lower leg. 07/18/13  Yes Historical Provider, MD   Labs:  Results for orders placed during the hospital encounter of 08/06/13  (from the past 48 hour(s))  CBC WITH DIFFERENTIAL     Status: Abnormal   Collection Time    08/06/13  5:55 AM      Result Value Ref Range   WBC 5.9  4.0 - 10.5 K/uL   RBC 4.45  4.22 - 5.81 MIL/uL   Hemoglobin 14.0  13.0 - 17.0 g/dL   HCT 44.8  39.0 - 52.0 %   MCV 100.7 (*) 78.0 - 100.0 fL   MCH 31.5  26.0 - 34.0 pg   MCHC 31.3  30.0 - 36.0 g/dL   RDW 19.5 (*) 11.5 - 15.5 %   Platelets 192  150 - 400 K/uL   Neutrophils Relative % 71  43 - 77 %   Neutro Abs 4.1  1.7 - 7.7 K/uL   Lymphocytes Relative 22  12 - 46 %   Lymphs Abs 1.3  0.7 - 4.0 K/uL   Monocytes Relative 7  3 - 12 %   Monocytes Absolute 0.4  0.1 - 1.0 K/uL   Eosinophils Relative 0  0 - 5 %   Eosinophils Absolute 0.0  0.0 - 0.7 K/uL   Basophils Relative 0  0 - 1 %   Basophils Absolute 0.0  0.0 - 0.1 K/uL  COMPREHENSIVE METABOLIC PANEL     Status: Abnormal   Collection Time    08/06/13  5:55 AM      Result Value Ref Range   Sodium 137  137 - 147 mEq/L   Potassium 6.4 (*) 3.7 - 5.3 mEq/L   Comment: HEMOLYSIS AT THIS LEVEL MAY AFFECT RESULT   Chloride 88 (*) 96 - 112 mEq/L   CO2 16 (*) 19 - 32 mEq/L   Glucose, Bld 71  70 - 99 mg/dL   BUN 35 (*) 6 - 23 mg/dL   Creatinine, Ser 5.76 (*) 0.50 - 1.35 mg/dL   Calcium 9.9  8.4 -  10.5 mg/dL   Total Protein 7.3  6.0 - 8.3 g/dL   Albumin 3.2 (*) 3.5 - 5.2 g/dL   AST 81 (*) 0 - 37 U/L   Comment: HEMOLYSIS AT THIS LEVEL MAY AFFECT RESULT   ALT 31  0 - 53 U/L   Comment: HEMOLYSIS AT THIS LEVEL MAY AFFECT RESULT   Alkaline Phosphatase 128 (*) 39 - 117 U/L   Comment: HEMOLYSIS AT THIS LEVEL MAY AFFECT RESULT   Total Bilirubin 1.1  0.3 - 1.2 mg/dL   GFR calc non Af Amer 9 (*) >90 mL/min   GFR calc Af Amer 11 (*) >90 mL/min   Comment: (NOTE)     The eGFR has been calculated using the CKD EPI equation.     This calculation has not been validated in all clinical situations.     eGFR's persistently <90 mL/min signify possible Chronic Kidney     Disease.  PROCALCITONIN      Status: None   Collection Time    08/06/13  5:55 AM      Result Value Ref Range   Procalcitonin 0.82     Comment:            Interpretation:     PCT > 0.5 ng/mL and <= 2 ng/mL:     Systemic infection (sepsis) is possible,     but other conditions are known to elevate     PCT as well.     (NOTE)             ICU PCT Algorithm               Non ICU PCT Algorithm        ----------------------------     ------------------------------             PCT < 0.25 ng/mL                 PCT < 0.1 ng/mL         Stopping of antibiotics            Stopping of antibiotics           strongly encouraged.               strongly encouraged.        ----------------------------     ------------------------------           PCT level decrease by               PCT < 0.25 ng/mL           >= 80% from peak PCT           OR PCT 0.25 - 0.5 ng/mL          Stopping of antibiotics                                                 encouraged.         Stopping of antibiotics               encouraged.        ----------------------------     ------------------------------           PCT level decrease by              PCT >= 0.25 ng/mL           <  80% from peak PCT            AND PCT >= 0.5 ng/mL            Continuing antibiotics                                                  encouraged.           Continuing antibiotics                encouraged.        ----------------------------     ------------------------------         PCT level increase compared          PCT > 0.5 ng/mL             with peak PCT AND              PCT >= 0.5 ng/mL             Escalation of antibiotics                                              strongly encouraged.          Escalation of antibiotics            strongly encouraged.  CG4 I-STAT (LACTIC ACID)     Status: Abnormal   Collection Time    08/06/13  6:14 AM      Result Value Ref Range   Lactic Acid, Venous 12.77 (*) 0.5 - 2.2 mmol/L  CG4 I-STAT (LACTIC ACID)     Status: Abnormal    Collection Time    08/06/13  8:19 AM      Result Value Ref Range   Lactic Acid, Venous 6.34 (*) 0.5 - 2.2 mmol/L  GLUCOSE, CAPILLARY     Status: None   Collection Time    08/06/13 10:33 AM      Result Value Ref Range   Glucose-Capillary 75  70 - 99 mg/dL   Constitutional: positive for weakness, chills and fevers, negative for sweats Ears, nose, mouth, throat, and face: negative for earaches, hoarseness, nasal congestion and sore throat Respiratory: negative for cough, dyspnea on exertion, hemoptysis and sputum Cardiovascular: negative for chest pain, chest pressure/discomfort, dyspnea, orthopnea and palpitations Gastrointestinal: positive for abdominal pain, negative for change in bowel habits, nausea and vomiting Genitourinary:negative, oliguric Musculoskeletal:negative for arthralgias, back pain, myalgias and neck pain Neurological: negative for dizziness, gait problems, headaches and paresthesia  Physical Exam: Filed Vitals:   08/06/13 1015  BP: 110/51  Pulse: 93  Temp:   Resp: 18     General appearance: alert, cooperative and no distress Head: Normocephalic, without obvious abnormality, atraumatic Neck: no adenopathy, no carotid bruit, no JVD and supple, symmetrical, trachea midline Resp: Decreased breath sounds esp at bases Bibasilar rales Cardio: regular rate and rhythm, S1, S2 normal, no murmur, click, rub or gallop GI: + BS, soft, significant tenderness especially on rigtht, mucopurulent drainage form PD catheter site, protruding tube on left  Extremities: 1-2 + lower extremity edema, no cyanosis Bilat LE pitting edema Neurologic: Grossly normal Dialysis Access: AVF @ LUA with + bruit   Assessment/Plan: 1. Abdominal pain - fever & chills,  PD catheter placed 2/10, now with purulent drainage and ? PD catheter displacement; IV Aztreonam, Flagyl & Vancomycin started, Surgery consult pending. 2. Hyperkalemia - K 6.4 today, s/p complete HD yesterday, but missed 2/11.    3. Abnormal chest x-ray - ? Pulmonary edema, pt asymptomatic. 4. ESRD - HD on MWF @ [facility].  HD pending today. 5. Hypertension/volume - BP now stable (115/59) after IVF, on Metoprolol 12.5 mg bid; wt 81.6 with EDW 79.   6. Anemia - Hgb 14, last T-sat 38% with ferritin 1936; no outpatient Epogen or Fe. 7. Metabolic bone disease - Ca 9.9 (10.5 corrected), last P 6.5, iPTH 180; Hectorol 5 mcg, Renvela 3 with meals. 8. Nutrition - Alb 3.2, NPO. 9. Hx glottis carcinoma - s/p radiation in 8-02/2013. 10. 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  LYLES,CHARLES 08/06/2013, 11:18 AM   Attending Nephrologist: Roney Jaffe, MD  I have seen and examined patient, discussed with PA and agree with assessment and plan as outlined above with additions as indicated.  Patient presented with abd pain and exit site drainage after recent PD cath placement.  Appreciate Gen surgery seeing patient. Internal tubing was exposed at exit site and external tubing unattached. Dressing had not been removed since placement. Gen surg replaced external tubing and flushed catheter. Plan is for empiric IV abx with vanc, aztreonam and flagyl. Replace cath in OR per surg is infection doesn't resolve. Also pt is vol overloaded with pulm edema and pitting leg edema. Needs HD today and tomorrow, possibly more, to get volume off and K down. Explained to patient.  HD today in ICU, UF today may be limited by lower BP's.    Kelly Splinter MD pager [phone]    cell [phone] 08/06/2013, 4:24 PM

## 2013-08-06 NOTE — Progress Notes (Signed)
ANTIBIOTIC CONSULT NOTE - INITIAL  Pharmacy Consult for Vancomycin, Flagyl, Aztreonam Indication: HCAP, sepsis  Allergies  Allergen Reactions  . Amoxicillin     Causes upset stomach  . Fentanyl     Confusion when patch applied    Patient Measurements: Height: 5\' 10"  (177.8 cm) Weight: 180 lb (81.647 kg) IBW/kg (Calculated) : 73 Adjusted Body Weight:   Vital Signs: Temp: 101.9 F (38.8 C) (02/15 0743) Temp src: Rectal (02/15 0743) BP: 102/51 mmHg (02/15 0900) Pulse Rate: 94 (02/15 0900) Intake/Output from previous day: 02/14 0701 - 02/15 0700 In: 1000 [I.V.:1000] Out: -  Intake/Output from this shift: Total I/O In: 2000 [I.V.:2000] Out: -   Labs:  Recent Labs  08/06/13 0555  WBC 5.9  HGB 14.0  PLT 192  CREATININE 5.76*   Estimated Creatinine Clearance: 12.8 ml/min (by C-G formula based on Cr of 5.76). No results found for this basename: VANCOTROUGH, Corlis Leak, VANCORANDOM, [place], GENTPEAK, GENTRANDOM, TOBRATROUGH, TOBRAPEAK, TOBRARND, AMIKACINPEAK, AMIKACINTROU, AMIKACIN,  in the last 72 hours   Microbiology: Recent Results (from the past 720 hour(s))  MRSA PCR SCREENING     Status: None   Collection Time    08/01/13  5:41 PM      Result Value Ref Range Status   MRSA by PCR NEGATIVE  NEGATIVE Final   Comment:            The GeneXpert MRSA Assay (FDA     approved for NASAL specimens     only), is one component of a     comprehensive MRSA colonization     surveillance program. It is not     intended to diagnose MRSA     infection nor to guide or     monitor treatment for     MRSA infections.    Medical History: Past Medical History  Diagnosis Date  . Chest pain     non-ischemic stress test 02/2011 (Dr. Radford Pax)  . Carotid artery occlusion   . Neuropathy   . Arthritis   . H/O vitamin D deficiency   . Hearing loss   . Hypercholesteremia   . 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  S/P radiation therapy 01/24/2013-03/14/2013    Larynx/glottis / 63 Gy in 28 fractions  . Hypertension     takes Metoprolol daily  . GERD (gastroesophageal reflux disease)     takes Nexium daily  . Complication of anesthesia     hard to wake up and then panics waking up  . Family history of anesthesia complication     Adopted  . Sleep apnea     to request from Dr.Fried was done about 41yrs ago but no cpap d/t weight loss  . Pneumonia 01/2013  . Weakness     over whole body  . Fibromyalgia     takes tramadol daily as needed  . Myalgia and myositis   . Peripheral neuropathy     was on Lyrica but has been off x a week(Was only on for 3days)  . History of gout   . Rash     both lower legs and feet and using Mupirocin  . History of colon polyps   . Chronic kidney disease     - Stage 5- follwed by Dr Sharlotte Alamo in [place]  . Diabetes mellitus     takes Metformin when needed d/t weight loss  . Cataracts, bilateral  immature  . COPD (chronic obstructive pulmonary disease)     Medications:  Scheduled:  . heparin  5,000 Units Subcutaneous 3 times per day  . insulin aspart  1-3 Units Subcutaneous 6 times per day  . ipratropium-albuterol  3 mL Nebulization Q6H  . pantoprazole  40 mg Oral Daily   Assessment: 68yo male with HCAP, sepsis.  He has recently had a peritoneal dialysis catheter placed on 2/10, but not used yet & has been on HD MWF.  Per d/w MD, plan is for HD.  Levofloxacin and Flagyl were given in ED ~0630.  Goal of Therapy:  Pre-HD Vancomycin level 15-25  Plan:  1-  Vancomycin 1500mg  IV x 1, f/u HD schedule for future dosing 2-  Aztreonam 1g now, then q24 3-  Flagyl 500mg  IV q8, next dose at 2PM 4-  F/U culture results 5-  Vancomycin level when appropriate  Gracy Bruins, PharmD [facility]

## 2013-08-06 NOTE — Progress Notes (Signed)
ANTIBIOTIC CONSULT NOTE - FOLLOW UP  Pharmacy Consult for Vancomycin, Flagyl, Aztreonam  Indication: HCAP, sepsis    Allergies  Allergen Reactions  . Amoxicillin     Causes upset stomach  . Fentanyl     Confusion when patch applied    Patient Measurements: Height: 5\' 10"  (177.8 cm) Weight: 180 lb (81.647 kg) IBW/kg (Calculated) : 73  Vital Signs: Temp: 97.5 F (36.4 C) (02/15 1540) Temp src: Oral (02/15 1540) BP: 113/61 mmHg (02/15 1500) Pulse Rate: 84 (02/15 1500) Intake/Output from previous day: 02/14 0701 - 02/15 0700 In: 1000 [I.V.:1000] Out: -  Intake/Output from this shift: Total I/O In: 3708.3 [I.V.:3008.3; IV Piggyback:700] Out: 0   Labs:  Recent Labs  08/06/13 0555 08/06/13 1219  WBC 5.9 8.9  HGB 14.0 12.1*  PLT 192 100*  CREATININE 5.76* 5.77*  5.92*   Estimated Creatinine Clearance: 12.8 ml/min (by C-G formula based on Cr of 5.77). No results found for this basename: VANCOTROUGH, Corlis Leak, VANCORANDOM, [place], GENTPEAK, GENTRANDOM, TOBRATROUGH, TOBRAPEAK, TOBRARND, AMIKACINPEAK, AMIKACINTROU, AMIKACIN,  in the last 72 hours   Microbiology: Recent Results (from the past 720 hour(s))  MRSA PCR SCREENING     Status: None   Collection Time    08/01/13  5:41 PM      Result Value Ref Range Status   MRSA by PCR NEGATIVE  NEGATIVE Final   Comment:            The GeneXpert MRSA Assay (FDA     approved for NASAL specimens     only), is one component of a     comprehensive MRSA colonization     surveillance program. It is not     intended to diagnose MRSA     infection nor to guide or     monitor treatment for     MRSA infections.  MRSA PCR SCREENING     Status: None   Collection Time    08/06/13 10:42 AM      Result Value Ref Range Status   MRSA by PCR NEGATIVE  NEGATIVE Final   Comment:            The GeneXpert MRSA Assay (FDA     approved for NASAL specimens     only), is one component of a     comprehensive MRSA colonization   surveillance program. It is not     intended to diagnose MRSA     infection nor to guide or     monitor treatment for     MRSA infections.    Anti-infectives   Start     Dose/Rate Route Frequency Ordered Stop   08/07/13 1100  aztreonam (AZACTAM) 1 g in dextrose 5 % 50 mL IVPB     1 g 100 mL/hr over 30 Minutes Intravenous Every 24 hours 08/06/13 0955     08/06/13 2000  vancomycin (VANCOCIN) IVPB 750 mg/150 ml premix     750 mg 150 mL/hr over 60 Minutes Intravenous  Once 08/06/13 1718     08/06/13 1400  metroNIDAZOLE (FLAGYL) IVPB 500 mg     500 mg 100 mL/hr over 60 Minutes Intravenous Every 8 hours 08/06/13 0955     08/06/13 1030  vancomycin (VANCOCIN) 1,500 mg in sodium chloride 0.9 % 500 mL IVPB     1,500 mg 250 mL/hr over 120 Minutes Intravenous NOW 08/06/13 0955 08/06/13 1257   08/06/13 1030  aztreonam (AZACTAM) 1 g in dextrose 5 % 50 mL IVPB  1 g 100 mL/hr over 30 Minutes Intravenous NOW 08/06/13 0955 08/06/13 1127   08/06/13 0630  levofloxacin (LEVAQUIN) IVPB 750 mg     750 mg 100 mL/hr over 90 Minutes Intravenous  Once 08/06/13 0618 08/06/13 0806   08/06/13 0630  metroNIDAZOLE (FLAGYL) IVPB 500 mg     500 mg 100 mL/hr over 60 Minutes Intravenous  Once 08/06/13 0618 08/06/13 0737      Assessment: 68yo male with HCAP, sepsis on vanc/azactam and flagyl. Patient noted for HD today  2/15 flagyl 2/15 vanc 2/15 azac  2/15 RVP- (p) 2/15 Blood x 2: 2/15 Urine  Goal of Therapy:  Pre-HD Vancomycin level 15-25    Plan:  -Vancomycin 750mg  IV post HD today -No other antibiotic changes needed -Will follow renal function, cultures and clinical progress  Hildred Laser, Pharm D 08/06/2013 5:28 PM

## 2013-08-06 NOTE — ED Notes (Signed)
Report initially attempted at this time.  According to charge nurse on admitting unit, bed is not actually ready and should be ready in approximately 30 minutes.

## 2013-08-06 NOTE — ED Notes (Signed)
Patient with abdominal pain, generalized.  Patient also febrile.  Patient is s/p surgical procedure for peritoneal dialysis on Tuesday.  Patient does have modeled abdomen on right side.  Patient is cyanotic in nail beds.  Patient is guarding abdomen.  Patient is from home.

## 2013-08-06 NOTE — Consult Note (Signed)
Reason for Consult: abdominal pain, malfunctioning CAPD catheter Referring Physician: Mahalia Longest   HPI: Victor Lane is a 68 year old male with a history of ESRD, vocal cord cancer who underwent PD cath placement on 08/01/13 with Dr. Michaell Cowing.  He had post op respiratory failure and required intubation, but was subsequently discharged the following day.  He was doing fairly well until this morning.  He presented to [facility] with chills, abdominal pain and drainage from PD catheter.  The patient states that the dressing and clothes with soaked in fluid.  He was found to have tachycardia.  He was five IVF bolus.  He is afebrile, tachycardia has improved, blood pressure is stable.  He had a normal white count.  Lactic acid of 12.77 then 6.34.  A CT of abdomen and pelvis did not show acute intra-abdominal abnormalities, suggested PNA with bilateral pleural effusions.  We have been asked to evaluate the patient for suspected peritonitis.  Apparently he had purulent drainage from the PD catheter.  The patient states that the catheter was flushed at some point by the HD nurses. denies nausea and vomiting.  He has been started on Vanc, flagyl and aztreonam.    Past Medical History  Diagnosis Date  . Chest pain     non-ischemic stress test 02/2011 (Dr. Mayford Knife)  . Carotid artery occlusion   . Neuropathy   . Arthritis   . H/O vitamin D deficiency   . Hearing loss   . Hypercholesteremia   . 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 S/P radiation therapy 01/24/2013-03/14/2013    Larynx/glottis / 63 Gy in 28 fractions  . Hypertension     takes Metoprolol daily  . GERD (gastroesophageal reflux disease)     takes Nexium daily  . Complication of anesthesia     hard to wake up and then panics waking up  . Family history of anesthesia complication     Adopted  . Sleep apnea     to request from Dr.Fried was done about 66yrs ago but no cpap d/t weight  loss  . Pneumonia 01/2013  . Weakness     over whole body  . Fibromyalgia     takes tramadol daily as needed  . Myalgia and myositis   . Peripheral neuropathy     was on Lyrica but has been off x a week(Was only on for 3days)  . History of gout   . Rash     both lower legs and feet and using Mupirocin  . History of colon polyps   . Chronic kidney disease     - Stage 5- follwed by Dr Dot Lanes in [place]  . Diabetes mellitus     takes Metformin when needed d/t weight loss  . Cataracts, bilateral     immature  . COPD (chronic obstructive pulmonary disease)     Past Surgical History  Procedure Laterality Date  . Cholecystectomy  1993  . Rotator cuff repair  07/06/2011    right  . Carotid endarterectomy Left   . Coolonoscopy    . Colonoscopy w/ biopsies and polypectomy      Hx: of  . Microlaryngoscopy with laser N/A 01/04/2013    Procedure: MICROLARYNGOSCOPY WITH BIOPSY/LASER;  Surgeon: Suzanna Obey, MD;  Location[facility] OR;  Service: ENT;  Laterality: N/A;  . Av fistula placement Left 02/23/2013    Procedure: ARTERIOVENOUS (AV) FISTULA CREATION;  Surgeon: Arlys John  Rolena Infante, MD;  Location: [facility] OR;  Service: Vascular;  Laterality: Left;  . Capd insertion N/A 08/01/2013    Procedure: LAPAROSCOPIC INSERTION CONTINUOUS AMBULATORY PERITONEAL DIALYSIS CATHETER;  Surgeon: Ardeth Sportsman, MD;  Location: [facility] OR;  Service: General;  Laterality: N/A;  . Laparoscopic abdominal exploration N/A 08/01/2013    Procedure: LAPAROSCOPIC ABDOMINAL EXPLORATION;  Surgeon: Ardeth Sportsman, MD;  Location: [facility] OR;  Service: General;  Laterality: N/A;    Family History  Problem Relation Age of Onset  . Adopted: Yes    Social History:  reports that he quit smoking about 18 years ago. His smoking use included Cigarettes. He has a 62 pack-year smoking history. He has never used smokeless tobacco. He reports that he does not drink alcohol or use illicit drugs.  Allergies:  Allergies  Allergen Reactions  .  Amoxicillin     Causes upset stomach  . Fentanyl     Confusion when patch applied    Medications:  Scheduled Meds: . [START ON 08/07/2013] aztreonam  1 g Intravenous Q24H  . heparin  5,000 Units Subcutaneous 3 times per day  . insulin aspart  1-3 Units Subcutaneous 6 times per day  . ipratropium-albuterol  3 mL Nebulization Q6H  . metronidazole  500 mg Intravenous Q8H  . pantoprazole  40 mg Oral Daily  . vancomycin  1,500 mg Intravenous NOW   Continuous Infusions: . sodium chloride 1,000 mL (08/06/13 1200)   PRN Meds:.sodium chloride, albuterol  Results for orders placed during the hospital encounter of 08/06/13 (from the past 48 hour(s))  CBC WITH DIFFERENTIAL     Status: Abnormal   Collection Time    08/06/13  5:55 AM      Result Value Ref Range   WBC 5.9  4.0 - 10.5 K/uL   RBC 4.45  4.22 - 5.81 MIL/uL   Hemoglobin 14.0  13.0 - 17.0 g/dL   HCT 24.0  11.0 - 46.5 %   MCV 100.7 (*) 78.0 - 100.0 fL   MCH 31.5  26.0 - 34.0 pg   MCHC 31.3  30.0 - 36.0 g/dL   RDW 32.8 (*) 32.9 - 99.9 %   Platelets 192  150 - 400 K/uL   Neutrophils Relative % 71  43 - 77 %   Neutro Abs 4.1  1.7 - 7.7 K/uL   Lymphocytes Relative 22  12 - 46 %   Lymphs Abs 1.3  0.7 - 4.0 K/uL   Monocytes Relative 7  3 - 12 %   Monocytes Absolute 0.4  0.1 - 1.0 K/uL   Eosinophils Relative 0  0 - 5 %   Eosinophils Absolute 0.0  0.0 - 0.7 K/uL   Basophils Relative 0  0 - 1 %   Basophils Absolute 0.0  0.0 - 0.1 K/uL  COMPREHENSIVE METABOLIC PANEL     Status: Abnormal   Collection Time    08/06/13  5:55 AM      Result Value Ref Range   Sodium 137  137 - 147 mEq/L   Potassium 6.4 (*) 3.7 - 5.3 mEq/L   Comment: HEMOLYSIS AT THIS LEVEL MAY AFFECT RESULT   Chloride 88 (*) 96 - 112 mEq/L   CO2 16 (*) 19 - 32 mEq/L   Glucose, Bld 71  70 - 99 mg/dL   BUN 35 (*) 6 - 23 mg/dL   Creatinine, Ser 2.09 (*) 0.50 - 1.35 mg/dL   Calcium 9.9  8.4 - 49.9 mg/dL   Total Protein  7.3  6.0 - 8.3 g/dL   Albumin 3.2 (*) 3.5 - 5.2  g/dL   AST 81 (*) 0 - 37 U/L   Comment: HEMOLYSIS AT THIS LEVEL MAY AFFECT RESULT   ALT 31  0 - 53 U/L   Comment: HEMOLYSIS AT THIS LEVEL MAY AFFECT RESULT   Alkaline Phosphatase 128 (*) 39 - 117 U/L   Comment: HEMOLYSIS AT THIS LEVEL MAY AFFECT RESULT   Total Bilirubin 1.1  0.3 - 1.2 mg/dL   GFR calc non Af Amer 9 (*) >90 mL/min   GFR calc Af Amer 11 (*) >90 mL/min   Comment: (NOTE)     The eGFR has been calculated using the CKD EPI equation.     This calculation has not been validated in all clinical situations.     eGFR's persistently <90 mL/min signify possible Chronic Kidney     Disease.  PROCALCITONIN     Status: None   Collection Time    08/06/13  5:55 AM      Result Value Ref Range   Procalcitonin 0.82     Comment:            Interpretation:     PCT > 0.5 ng/mL and <= 2 ng/mL:     Systemic infection (sepsis) is possible,     but other conditions are known to elevate     PCT as well.     (NOTE)             ICU PCT Algorithm               Non ICU PCT Algorithm        ----------------------------     ------------------------------             PCT < 0.25 ng/mL                 PCT < 0.1 ng/mL         Stopping of antibiotics            Stopping of antibiotics           strongly encouraged.               strongly encouraged.        ----------------------------     ------------------------------           PCT level decrease by               PCT < 0.25 ng/mL           >= 80% from peak PCT           OR PCT 0.25 - 0.5 ng/mL          Stopping of antibiotics                                                 encouraged.         Stopping of antibiotics               encouraged.        ----------------------------     ------------------------------           PCT level decrease by              PCT >= 0.25 ng/mL           <  80% from peak PCT            AND PCT >= 0.5 ng/mL            Continuing antibiotics                                                  encouraged.            Continuing antibiotics                encouraged.        ----------------------------     ------------------------------         PCT level increase compared          PCT > 0.5 ng/mL             with peak PCT AND              PCT >= 0.5 ng/mL             Escalation of antibiotics                                              strongly encouraged.          Escalation of antibiotics            strongly encouraged.  CG4 I-STAT (LACTIC ACID)     Status: Abnormal   Collection Time    08/06/13  6:14 AM      Result Value Ref Range   Lactic Acid, Venous 12.77 (*) 0.5 - 2.2 mmol/L  CG4 I-STAT (LACTIC ACID)     Status: Abnormal   Collection Time    08/06/13  8:19 AM      Result Value Ref Range   Lactic Acid, Venous 6.34 (*) 0.5 - 2.2 mmol/L  GLUCOSE, CAPILLARY     Status: None   Collection Time    08/06/13 10:33 AM      Result Value Ref Range   Glucose-Capillary 75  70 - 99 mg/dL    Ct Abdomen Pelvis Wo Contrast  08/06/2013   CLINICAL DATA:  Abdominal pain and fever. Recent surgical procedure for peritoneal dialysis 5 days ago.  EXAM: CT ABDOMEN AND PELVIS WITHOUT CONTRAST  TECHNIQUE: Multidetector CT imaging of the abdomen and pelvis was performed following the standard protocol without intravenous contrast.  COMPARISON:  None.  FINDINGS: Lung bases demonstrate a moderate size right pleural effusion and small left pleural effusion with associated airspace opacification which may be due to atelectasis or infection. There is mild cardiomegaly.  Abdominal images demonstrate evidence of a prior cholecystectomy. There is a small amount of perihepatic fluid. There is mild fatty infiltration of the liver with subtle nodular contour. The pancreas and adrenal glands are unremarkable. Appendix is within normal. Kidneys are normal in size with bilateral renal cortical thinning. There is a 6 cm exophytic simple cyst over the mid pole of the left kidney. There are a few small of bilateral hypodensities likely  cysts. There is a 1.1 cm homogeneously hyperdense mass over the lower pole of the left kidney with Hounsfield unit measurements 45 likely a hemorrhagic cyst. Ureters are within normal. There is no hydronephrosis or nephrolithiasis. There is diverticulosis of the colon  most prominent over the sigmoid colon. There is a peritoneal dialysis catheter entering the left mid abdomen with tip coiled over the pelvis between loops of rectosigmoid colon. There is moderate calcified plaque of the thoracoabdominal aorta and iliac arteries. Minimal free fluid over the right pericolic gutter. No evidence of free peritoneal air.  Remaining pelvic structures are notable only for moderate fecal retention over the rectosigmoid colon. There is mild degenerative change of the spine and hips.  IMPRESSION: Moderate size right pleural effusion and small left pleural effusion with associated airspace opacification in the lower lobes which may be due to pneumonia or atelectasis.  Peritoneal dialysis catheter with tip coiled over the pelvis between several recto sigmoid colonic loops.  Changes involving the liver which may be due to cirrhosis. Small amount of ascites.  Bilateral renal cortical thinning and bilateral renal cysts. 1.1 cm homogeneously hyperdense left renal mass likely a hemorrhagic cyst. Recommend followup CT 6 months.  Diverticulosis of the colon. Moderate fecal retention over the rectosigmoid colon.  Small hiatal hernia.   Electronically Signed   By: Marin Olp M.D.   On: 08/06/2013 07:57   Dg Chest Port 1 View  08/06/2013   CLINICAL DATA:  68 year old male with shortness of breath, fever abdominal pain cyanosis. Initial encounter.  EXAM: PORTABLE CHEST - 1 VIEW  COMPARISON:  08/02/2013 and earlier.  FINDINGS: Portable AP semi upright view at 0612 hrs.  Extubated. Decreased bilateral veiling pulmonary opacity. Improved retrocardiac ventilation. Stable cardiac size and mediastinal contours. No pneumothorax. Stable  pulmonary vascularity/interstitial opacity.  IMPRESSION: 1. Extubated. 2. Decreased appearance of bilateral pleural effusions and improved bibasilar ventilation. 3. Continued  pulmonary vascular congestion/interstitial edema.   Electronically Signed   By: Lars Pinks M.D.   On: 08/06/2013 06:31    Review of Systems  Constitutional: Positive for chills. Negative for malaise/fatigue and diaphoresis.  Respiratory: Negative for hemoptysis and shortness of breath.   Cardiovascular: Negative for chest pain and palpitations.  Gastrointestinal: Positive for abdominal pain. Negative for nausea, vomiting, diarrhea, constipation, blood in stool and melena.       LBM on Saturday   Neurological: Positive for weakness. Negative for dizziness and loss of consciousness.   Blood pressure 109/56, pulse 88, temperature 97.1 F (36.2 C), temperature source Oral, resp. rate 16, height $RemoveBe'5\' 10"'[id number]$  (1.778 m), weight 81.647 kg (180 lb), SpO2 95.00%. Physical Exam  Constitutional: He appears well-developed and well-nourished. No distress.  GI: Soft. Bowel sounds are normal.  Generalized tenderness with voluntary guarding.  External catheter is disconnected.  There is serosanguinous drainage on dressing.  No culture was obtained due to lack of discharge.  Skin: Skin is warm and dry. He is not diaphoretic.  Psychiatric: He has a normal mood and affect. His behavior is normal.    Assessment/Plan: S/p peritoneal catheter placement on 2/101/5 The external catheter is disconnected.  Recommend PD/HD nurses to reattach the external catheter sterilely.  No urgent surgical intervention at present time.  Dr. Johney Maine will evaluate the patient shortly.  Garrette Caine ANP-BC 08/06/2013, 12:44 PM

## 2013-08-07 ENCOUNTER — Inpatient Hospital Stay (HOSPITAL_COMMUNITY): Payer: Medicare Other

## 2013-08-07 ENCOUNTER — Telehealth: Payer: Self-pay | Admitting: *Deleted

## 2013-08-07 DIAGNOSIS — R778 Other specified abnormalities of plasma proteins: Secondary | ICD-10-CM | POA: Diagnosis present

## 2013-08-07 DIAGNOSIS — Z992 Dependence on renal dialysis: Secondary | ICD-10-CM

## 2013-08-07 DIAGNOSIS — R7989 Other specified abnormal findings of blood chemistry: Secondary | ICD-10-CM

## 2013-08-07 DIAGNOSIS — I359 Nonrheumatic aortic valve disorder, unspecified: Secondary | ICD-10-CM

## 2013-08-07 DIAGNOSIS — J9600 Acute respiratory failure, unspecified whether with hypoxia or hypercapnia: Secondary | ICD-10-CM

## 2013-08-07 DIAGNOSIS — R109 Unspecified abdominal pain: Secondary | ICD-10-CM

## 2013-08-07 DIAGNOSIS — A419 Sepsis, unspecified organism: Principal | ICD-10-CM

## 2013-08-07 DIAGNOSIS — D638 Anemia in other chronic diseases classified elsewhere: Secondary | ICD-10-CM

## 2013-08-07 LAB — RESPIRATORY VIRUS PANEL
Adenovirus: NOT DETECTED
INFLUENZA B 1: NOT DETECTED
Influenza A H1: NOT DETECTED
Influenza A H3: DETECTED — AB
Influenza A: DETECTED — AB
METAPNEUMOVIRUS: NOT DETECTED
PARAINFLUENZA 1 A: NOT DETECTED
PARAINFLUENZA 3 A: NOT DETECTED
Parainfluenza 2: NOT DETECTED
Respiratory Syncytial Virus A: NOT DETECTED
Respiratory Syncytial Virus B: NOT DETECTED
Rhinovirus: NOT DETECTED

## 2013-08-07 LAB — CBC
HEMATOCRIT: 36.5 % — AB (ref 39.0–52.0)
Hemoglobin: 11.6 g/dL — ABNORMAL LOW (ref 13.0–17.0)
MCH: 31 pg (ref 26.0–34.0)
MCHC: 31.8 g/dL (ref 30.0–36.0)
MCV: 97.6 fL (ref 78.0–100.0)
PLATELETS: 111 10*3/uL — AB (ref 150–400)
RBC: 3.74 MIL/uL — ABNORMAL LOW (ref 4.22–5.81)
RDW: 19.2 % — ABNORMAL HIGH (ref 11.5–15.5)
WBC: 10.3 10*3/uL (ref 4.0–10.5)

## 2013-08-07 LAB — GLUCOSE, CAPILLARY
GLUCOSE-CAPILLARY: 107 mg/dL — AB (ref 70–99)
GLUCOSE-CAPILLARY: 116 mg/dL — AB (ref 70–99)
Glucose-Capillary: 104 mg/dL — ABNORMAL HIGH (ref 70–99)
Glucose-Capillary: 119 mg/dL — ABNORMAL HIGH (ref 70–99)
Glucose-Capillary: 66 mg/dL — ABNORMAL LOW (ref 70–99)
Glucose-Capillary: 69 mg/dL — ABNORMAL LOW (ref 70–99)
Glucose-Capillary: 89 mg/dL (ref 70–99)
Glucose-Capillary: 96 mg/dL (ref 70–99)

## 2013-08-07 LAB — STREP PNEUMONIAE URINARY ANTIGEN: STREP PNEUMO URINARY ANTIGEN: NEGATIVE

## 2013-08-07 LAB — TROPONIN I
Troponin I: 1.19 ng/mL (ref <0.30)
Troponin I: 0.89 ng/mL (ref <0.30)

## 2013-08-07 LAB — BASIC METABOLIC PANEL
BUN: 20 mg/dL (ref 6–23)
CO2: 27 mEq/L (ref 19–32)
Calcium: 8.2 mg/dL — ABNORMAL LOW (ref 8.4–10.5)
Chloride: 95 mEq/L — ABNORMAL LOW (ref 96–112)
Creatinine, Ser: 3.39 mg/dL — ABNORMAL HIGH (ref 0.50–1.35)
GFR, EST AFRICAN AMERICAN: 20 mL/min — AB (ref >90)
GFR, EST NON AFRICAN AMERICAN: 17 mL/min — AB (ref >90)
Glucose, Bld: 103 mg/dL — ABNORMAL HIGH (ref 70–99)
POTASSIUM: 3.2 meq/L — AB (ref 3.7–5.3)
SODIUM: 137 meq/L (ref 137–147)

## 2013-08-07 LAB — URINALYSIS, ROUTINE W REFLEX MICROSCOPIC
Glucose, UA: 100 mg/dL — AB
Ketones, ur: NEGATIVE mg/dL
Nitrite: NEGATIVE
Protein, ur: >300 mg/dL — AB
SPECIFIC GRAVITY, URINE: 1.02 (ref 1.005–1.030)
UROBILINOGEN UA: 1 mg/dL (ref 0.0–1.0)
pH: 7.5 (ref 5.0–8.0)

## 2013-08-07 LAB — URINE MICROSCOPIC-ADD ON

## 2013-08-07 LAB — CORTISOL: Cortisol, Plasma: 37.8 ug/dL

## 2013-08-07 LAB — HEPATITIS B SURFACE ANTIGEN: Hepatitis B Surface Ag: NEGATIVE

## 2013-08-07 LAB — LACTIC ACID, PLASMA: Lactic Acid, Venous: 2 mmol/L (ref 0.5–2.2)

## 2013-08-07 MED ORDER — VANCOMYCIN HCL IN DEXTROSE 750-5 MG/150ML-% IV SOLN
750.0000 mg | Freq: Once | INTRAVENOUS | Status: AC
  Administered 2013-08-07: 750 mg via INTRAVENOUS
  Filled 2013-08-07: qty 150

## 2013-08-07 MED ORDER — PENTAFLUOROPROP-TETRAFLUOROETH EX AERO: 1.0000 "application " | INHALATION_SPRAY | CUTANEOUS | Status: DC | PRN

## 2013-08-07 MED ORDER — BISACODYL 10 MG RE SUPP: 10.0000 mg | Freq: Two times a day (BID) | RECTAL | Status: DC | PRN

## 2013-08-07 MED ORDER — ALTEPLASE 2 MG IJ SOLR
2.0000 mg | Freq: Once | INTRAMUSCULAR | Status: AC | PRN
  Filled 2013-08-07: qty 2

## 2013-08-07 MED ORDER — SODIUM CHLORIDE 0.9 % IV SOLN: 100.0000 mL | INTRAVENOUS | Status: DC | PRN

## 2013-08-07 MED ORDER — LIDOCAINE-PRILOCAINE 2.5-2.5 % EX CREA
1.0000 "application " | TOPICAL_CREAM | CUTANEOUS | Status: DC | PRN
  Filled 2013-08-07: qty 5

## 2013-08-07 MED ORDER — DOXERCALCIFEROL 4 MCG/2ML IV SOLN
5.0000 ug | INTRAVENOUS | Status: DC
  Administered 2013-08-07 – 2013-08-14 (×4): 5 ug via INTRAVENOUS
  Filled 2013-08-07 (×4): qty 4

## 2013-08-07 MED ORDER — LACTULOSE 10 GM/15ML PO SOLN
20.0000 g | Freq: Two times a day (BID) | ORAL | Status: DC | PRN
  Filled 2013-08-07: qty 30

## 2013-08-07 MED ORDER — LIDOCAINE HCL (PF) 1 % IJ SOLN
5.0000 mL | INTRAMUSCULAR | Status: DC | PRN
Start: 2013-08-07 — End: 2013-08-10

## 2013-08-07 MED ORDER — HEPARIN SODIUM (PORCINE) 1000 UNIT/ML DIALYSIS
1000.0000 [IU] | INTRAMUSCULAR | Status: DC | PRN
  Filled 2013-08-07: qty 1

## 2013-08-07 MED ORDER — IPRATROPIUM-ALBUTEROL 0.5-2.5 (3) MG/3ML IN SOLN
3.0000 mL | Freq: Three times a day (TID) | RESPIRATORY_TRACT | Status: DC
  Administered 2013-08-08 – 2013-08-09 (×4): 3 mL via RESPIRATORY_TRACT
  Filled 2013-08-07 (×4): qty 3

## 2013-08-07 MED ORDER — SACCHAROMYCES BOULARDII 250 MG PO CAPS
250.0000 mg | ORAL_CAPSULE | Freq: Two times a day (BID) | ORAL | Status: DC
  Administered 2013-08-07 – 2013-08-15 (×16): 250 mg via ORAL
  Filled 2013-08-07 (×19): qty 1

## 2013-08-07 MED ORDER — DEXTROSE 50 % IV SOLN
25.0000 mL | Freq: Once | INTRAVENOUS | Status: AC | PRN
  Administered 2013-08-07: 25 mL via INTRAVENOUS
  Filled 2013-08-07: qty 50

## 2013-08-07 MED ORDER — DOXERCALCIFEROL 4 MCG/2ML IV SOLN
INTRAVENOUS | Status: AC
  Administered 2013-08-07: 5 ug via INTRAVENOUS
  Filled 2013-08-07: qty 4

## 2013-08-07 MED ORDER — ACETAMINOPHEN 325 MG PO TABS
325.0000 mg | ORAL_TABLET | Freq: Four times a day (QID) | ORAL | Status: DC | PRN
  Administered 2013-08-11: 650 mg via ORAL
  Filled 2013-08-07 (×2): qty 2
  Filled 2013-08-07: qty 1

## 2013-08-07 MED ORDER — POLYETHYLENE GLYCOL 3350 17 G PO PACK
17.0000 g | PACK | Freq: Two times a day (BID) | ORAL | Status: DC
  Administered 2013-08-07 – 2013-08-14 (×7): 17 g via ORAL
  Filled 2013-08-07 (×19): qty 1

## 2013-08-07 MED ORDER — HEPARIN SODIUM (PORCINE) 1000 UNIT/ML DIALYSIS
2000.0000 [IU] | Freq: Once | INTRAMUSCULAR | Status: DC
  Filled 2013-08-07: qty 2

## 2013-08-07 MED ORDER — ACETAMINOPHEN 650 MG RE SUPP: 650.0000 mg | Freq: Four times a day (QID) | RECTAL | Status: DC | PRN

## 2013-08-07 MED ORDER — MAGIC MOUTHWASH
15.0000 mL | Freq: Four times a day (QID) | ORAL | Status: DC | PRN
  Administered 2013-08-13: 15 mL via ORAL
  Filled 2013-08-07 (×2): qty 15

## 2013-08-07 MED ORDER — BLISTEX EX OINT
1.0000 "application " | TOPICAL_OINTMENT | Freq: Two times a day (BID) | CUTANEOUS | Status: DC
  Administered 2013-08-07 – 2013-08-14 (×14): 1 via TOPICAL
  Filled 2013-08-07 (×3): qty 10

## 2013-08-07 MED ORDER — RENA-VITE PO TABS
1.0000 | ORAL_TABLET | Freq: Every day | ORAL | Status: DC
  Administered 2013-08-07 – 2013-08-14 (×8): 1 via ORAL
  Filled 2013-08-07 (×10): qty 1

## 2013-08-07 MED ORDER — NEPRO/CARBSTEADY PO LIQD
237.0000 mL | ORAL | Status: DC | PRN
  Filled 2013-08-07: qty 237

## 2013-08-07 MED ORDER — ALUM & MAG HYDROXIDE-SIMETH 200-200-20 MG/5ML PO SUSP
30.0000 mL | Freq: Four times a day (QID) | ORAL | Status: DC | PRN
  Filled 2013-08-07: qty 30

## 2013-08-07 NOTE — Progress Notes (Signed)
Report called to receiving nurse [phone]. Past medical history, present hospitalization course reported to receiving nurse. All questions answered. Patient and son made aware of transfer. Son at bedside.

## 2013-08-07 NOTE — Progress Notes (Signed)
[facility] Progress Note  Subjective:  No emerging complaints. HD in progress  Objective Filed Vitals:   08/07/13 1129 08/07/13 1225 08/07/13 1230 08/07/13 1245  BP:  121/65 126/63 118/60  Pulse: 102 98 94 92  Temp:   97.9 F (36.6 C)   TempSrc:   Oral   Resp: 18 16 17 20   Height:      Weight: 80.1 kg (176 lb 9.4 oz)     SpO2: 93% 97%     Physical Exam General: Alert, cooperative, NAD Heart: RRR, no murmur or rub Lungs: Bibasilar crackles. No wheezes, rales or rhonchi Abdomen: +BS, distended, abdominal binder in place Extremities: 2+ LE pitting edema Dialysis Access: LUA AVF patent. Trace proximal ecchymosis s/p infiltration today  Dialysis Orders: MWF @ [facility]  4 hrs 79 kg 400/A1.5 2K/2.25Ca Heparin 2400 U AVF @ LUA  Hectorol 5 mcg Epogen 0 Venofer 0  Assessment/Plan: 1. Abdominal pain/Peritonitis - Afebrile. Chills resolved. PD catheter placed 2/10, now with purulent drainage and ? PD catheter displacement; IV Aztreonam, Flagyl & Vancomycin started. Surgery has seen. External tubing replaced sterilely. Plans replacement of PD cath in OR if peritonitis does not resolve. 2. Hyperkalemia - K 6.4 on admit, Resolved with HD  3. ESRD - MWF, HD today as a separate. 4. HyPOtension/volume - SBPs 110s s/p IVF. No pressors. Home Metoprolol 12.5 mg bid on hold;  Reached EDW at last outpatient HD but still with vol xs on exam. Trying for UF 3.5L today as BP will allow. May need an additional tx tomorrow. Needs lower edw. 5. Anemia - Hgb 11.6,  last T-sat 38% with ferritin 1936; no outpatient Epogen or Fe. 6. Metabolic bone disease - Ca 8.2(~8.8 corrected), last P 5.3, iPTH 180; Hectorol 5 mcg, Hold Renvela 3 pending resolution of #1  7. Nutrition - Alb 3.2, Renal/carb mod diet, multivitamin. 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 9. 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 10. HD access - LUA AVF with small infiltration today. Seems to be  running well. Will ice post HD.  Collene Leyden. Rhodia Albright [facility] Pager [phone] 08/07/2013,1:01 PM  LOS: 1 day    Additional Objective Labs: Basic Metabolic Panel:  Recent Labs Lab 08/02/13 0220 08/06/13 0555 08/06/13 1219 08/07/13 0144  NA 141 137 137 137  K 3.6* 6.4* 4.3 3.2*  CL 96 88* 95* 95*  CO2 25 16* 19 27  GLUCOSE 84 71 89 103*  BUN 48* 35* 41* 20  CREATININE 6.26* 5.76* 5.77*  5.92* 3.39*  CALCIUM 8.6 9.9 8.6 8.2*  PHOS 5.3*  --   --   --    Liver Function Tests:  Recent Labs Lab 08/06/13 0555  AST 81*  ALT 31  ALKPHOS 128*  BILITOT 1.1  PROT 7.3  ALBUMIN 3.2*    Recent Labs Lab 08/06/13 1219  LIPASE 34  AMYLASE 1053*   CBC:  Recent Labs Lab 08/02/13 0220 08/06/13 0555 08/06/13 1219 08/07/13 0144  WBC 5.5 5.9 8.9 10.3  NEUTROABS  --  4.1  --   --   HGB 13.0 14.0 12.1* 11.6*  HCT 41.0 44.8 38.6* 36.5*  MCV 96.7 100.7* 97.7 97.6  PLT 147* 192 100* 111*   Blood Culture    Component Value Date/Time   SDES BLOOD RIGHT FOREARM 08/06/2013 0630   SPECREQUEST BOTTLES DRAWN AEROBIC AND ANAEROBIC 6CC 08/06/2013 0630   CULT  Value:  BLOOD CULTURE RECEIVED NO GROWTH TO DATE CULTURE WILL BE HELD FOR 5 DAYS BEFORE ISSUING A FINAL NEGATIVE REPORT Performed at [facility] 08/06/2013 0630   REPTSTATUS PENDING 08/06/2013 0630    Cardiac Enzymes:  Recent Labs Lab 08/06/13 1219 08/06/13 1935 08/07/13 0144 08/07/13 0815  TROPONINI 0.94* 1.31* 1.19* 0.89*   CBG:  Recent Labs Lab 08/07/13 0020 08/07/13 0159 08/07/13 0338 08/07/13 0752 08/07/13 1211  GLUCAP 69* 96 104* 89 107*   Iron Studies: No results found for this basename: IRON, TIBC, TRANSFERRIN, FERRITIN,  in the last 72 hours @lablastinr3 @ Studies/Results: Ct Abdomen Pelvis Wo Contrast  08/06/2013   CLINICAL DATA:  Abdominal pain and fever. Recent surgical procedure for peritoneal dialysis 5 days ago.  EXAM: CT ABDOMEN AND PELVIS WITHOUT CONTRAST   TECHNIQUE: Multidetector CT imaging of the abdomen and pelvis was performed following the standard protocol without intravenous contrast.  COMPARISON:  None.  FINDINGS: Lung bases demonstrate a moderate size right pleural effusion and small left pleural effusion with associated airspace opacification which may be due to atelectasis or infection. There is mild cardiomegaly.  Abdominal images demonstrate evidence of a prior cholecystectomy. There is a small amount of perihepatic fluid. There is mild fatty infiltration of the liver with subtle nodular contour. The pancreas and adrenal glands are unremarkable. Appendix is within normal. Kidneys are normal in size with bilateral renal cortical thinning. There is a 6 cm exophytic simple cyst over the mid pole of the left kidney. There are a few small of bilateral hypodensities likely cysts. There is a 1.1 cm homogeneously hyperdense mass over the lower pole of the left kidney with Hounsfield unit measurements 45 likely a hemorrhagic cyst. Ureters are within normal. There is no hydronephrosis or nephrolithiasis. There is diverticulosis of the colon most prominent over the sigmoid colon. There is a peritoneal dialysis catheter entering the left mid abdomen with tip coiled over the pelvis between loops of rectosigmoid colon. There is moderate calcified plaque of the thoracoabdominal aorta and iliac arteries. Minimal free fluid over the right pericolic gutter. No evidence of free peritoneal air.  Remaining pelvic structures are notable only for moderate fecal retention over the rectosigmoid colon. There is mild degenerative change of the spine and hips.  IMPRESSION: Moderate size right pleural effusion and small left pleural effusion with associated airspace opacification in the lower lobes which may be due to pneumonia or atelectasis.  Peritoneal dialysis catheter with tip coiled over the pelvis between several recto sigmoid colonic loops.  Changes involving the liver which  may be due to cirrhosis. Small amount of ascites.  Bilateral renal cortical thinning and bilateral renal cysts. 1.1 cm homogeneously hyperdense left renal mass likely a hemorrhagic cyst. Recommend followup CT 6 months.  Diverticulosis of the colon. Moderate fecal retention over the rectosigmoid colon.  Small hiatal hernia.   Electronically Signed   By: Marin Olp M.D.   On: 08/06/2013 07:57   Dg Chest Port 1 View  08/07/2013   CLINICAL DATA:  Shortness of breath, pneumonia  EXAM: PORTABLE CHEST - 1 VIEW  COMPARISON:  DG CHEST 1V PORT dated 08/06/2013; DG CHEST 1V PORT dated 08/02/2013; DG CHEST 2 VIEW dated 04/25/2013; DG CHEST 1V PORT dated 02/16/2013  FINDINGS: Grossly unchanged enlarged cardiac silhouette and mediastinal contours with atherosclerotic plaque within the thoracic aorta. The pulmonary vasculature is indistinct with cephalization of flow. Interval increase in now small layering bilateral effusions with associated worsening bibasilar opacities, left greater than right. No  definite evidence of edema. Grossly unchanged bones including presumed resection of the distal end of the right clavicle.  IMPRESSION: Worsening pulmonary edema, small layering bilateral effusions and associated bibasilar opacities, left greater than right, atelectasis versus infiltrate.   Electronically Signed   By: Sandi Mariscal M.D.   On: 08/07/2013 07:36   Dg Chest Port 1 View  08/06/2013   CLINICAL DATA:  68 year old male with shortness of breath, fever abdominal pain cyanosis. Initial encounter.  EXAM: PORTABLE CHEST - 1 VIEW  COMPARISON:  08/02/2013 and earlier.  FINDINGS: Portable AP semi upright view at 0612 hrs.  Extubated. Decreased bilateral veiling pulmonary opacity. Improved retrocardiac ventilation. Stable cardiac size and mediastinal contours. No pneumothorax. Stable pulmonary vascularity/interstitial opacity.  IMPRESSION: 1. Extubated. 2. Decreased appearance of bilateral pleural effusions and improved bibasilar  ventilation. 3. Continued  pulmonary vascular congestion/interstitial edema.   Electronically Signed   By: Lars Pinks M.D.   On: 08/06/2013 06:31   Medications: . sodium chloride 10 mL/hr at 08/06/13 2200   . aztreonam  1 g Intravenous Q24H  . [START ON 08/08/2013] heparin  2,000 Units Dialysis Once in dialysis  . heparin  5,000 Units Subcutaneous 3 times per day  . insulin aspart  1-3 Units Subcutaneous 6 times per day  . ipratropium-albuterol  3 mL Nebulization Q6H  . lip balm  1 application Topical BID  . metronidazole  500 mg Intravenous Q8H  . pantoprazole  40 mg Oral Daily  . polyethylene glycol  17 g Oral BID  . saccharomyces boulardii  250 mg Oral BID

## 2013-08-07 NOTE — Progress Notes (Signed)
UR Completed.  Victor Lane [phone] 08/07/2013  

## 2013-08-07 NOTE — Progress Notes (Signed)
CBG 69. Pt c/o nausia unable to take further POs. D50 given IV 47ml.

## 2013-08-07 NOTE — Progress Notes (Signed)
TELEMETRY: Reviewed telemetry pt in NSR: Filed Vitals:   08/07/13 0900 08/07/13 1000 08/07/13 1129 08/07/13 1230  BP: 116/56   126/63  Pulse: 102 101 102 94  Temp:      TempSrc:      Resp: 18 18 18 17   Height:      Weight:      SpO2: 93% 92% 93%     Intake/Output Summary (Last 24 hours) at 08/07/13 1241 Last data filed at 08/07/13 0900  Gross per 24 hour  Intake 1563.34 ml  Output   2490 ml  Net -926.66 ml    SUBJECTIVE Currently being dialised. Denies chest pain or SOB.  LABS: Basic Metabolic Panel:  Recent Labs  08/06/13 1219 08/07/13 0144  NA 137 137  K 4.3 3.2*  CL 95* 95*  CO2 19 27  GLUCOSE 89 103*  BUN 41* 20  CREATININE 5.77*  5.92* 3.39*  CALCIUM 8.6 8.2*   Liver Function Tests:  Recent Labs  08/06/13 0555  AST 81*  ALT 31  ALKPHOS 128*  BILITOT 1.1  PROT 7.3  ALBUMIN 3.2*    Recent Labs  08/06/13 1219  LIPASE 34  AMYLASE 1053*   CBC:  Recent Labs  08/06/13 0555 08/06/13 1219 08/07/13 0144  WBC 5.9 8.9 10.3  NEUTROABS 4.1  --   --   HGB 14.0 12.1* 11.6*  HCT 44.8 38.6* 36.5*  MCV 100.7* 97.7 97.6  PLT 192 100* 111*   Cardiac Enzymes:  Recent Labs  08/06/13 1935 08/07/13 0144 08/07/13 0815  TROPONINI 1.31* 1.19* 0.89*   BNP:  Radiology/Studies:  Ct Abdomen Pelvis Wo Contrast  08/06/2013   CLINICAL DATA:  Abdominal pain and fever. Recent surgical procedure for peritoneal dialysis 5 days ago.  EXAM: CT ABDOMEN AND PELVIS WITHOUT CONTRAST  TECHNIQUE: Multidetector CT imaging of the abdomen and pelvis was performed following the standard protocol without intravenous contrast.  COMPARISON:  None.  FINDINGS: Lung bases demonstrate a moderate size right pleural effusion and small left pleural effusion with associated airspace opacification which may be due to atelectasis or infection. There is mild cardiomegaly.  Abdominal images demonstrate evidence of a prior cholecystectomy. There is a small amount of perihepatic fluid.  There is mild fatty infiltration of the liver with subtle nodular contour. The pancreas and adrenal glands are unremarkable. Appendix is within normal. Kidneys are normal in size with bilateral renal cortical thinning. There is a 6 cm exophytic simple cyst over the mid pole of the left kidney. There are a few small of bilateral hypodensities likely cysts. There is a 1.1 cm homogeneously hyperdense mass over the lower pole of the left kidney with Hounsfield unit measurements 45 likely a hemorrhagic cyst. Ureters are within normal. There is no hydronephrosis or nephrolithiasis. There is diverticulosis of the colon most prominent over the sigmoid colon. There is a peritoneal dialysis catheter entering the left mid abdomen with tip coiled over the pelvis between loops of rectosigmoid colon. There is moderate calcified plaque of the thoracoabdominal aorta and iliac arteries. Minimal free fluid over the right pericolic gutter. No evidence of free peritoneal air.  Remaining pelvic structures are notable only for moderate fecal retention over the rectosigmoid colon. There is mild degenerative change of the spine and hips.  IMPRESSION: Moderate size right pleural effusion and small left pleural effusion with associated airspace opacification in the lower lobes which may be due to pneumonia or atelectasis.  Peritoneal dialysis catheter with tip coiled over the pelvis  between several recto sigmoid colonic loops.  Changes involving the liver which may be due to cirrhosis. Small amount of ascites.  Bilateral renal cortical thinning and bilateral renal cysts. 1.1 cm homogeneously hyperdense left renal mass likely a hemorrhagic cyst. Recommend followup CT 6 months.  Diverticulosis of the colon. Moderate fecal retention over the rectosigmoid colon.  Small hiatal hernia.   Electronically Signed   By: Marin Olp M.D.   On: 08/06/2013 07:57   Dg Chest Port 1 View  08/07/2013   CLINICAL DATA:  Shortness of breath, pneumonia   EXAM: PORTABLE CHEST - 1 VIEW  COMPARISON:  DG CHEST 1V PORT dated 08/06/2013; DG CHEST 1V PORT dated 08/02/2013; DG CHEST 2 VIEW dated 04/25/2013; DG CHEST 1V PORT dated 02/16/2013  FINDINGS: Grossly unchanged enlarged cardiac silhouette and mediastinal contours with atherosclerotic plaque within the thoracic aorta. The pulmonary vasculature is indistinct with cephalization of flow. Interval increase in now small layering bilateral effusions with associated worsening bibasilar opacities, left greater than right. No definite evidence of edema. Grossly unchanged bones including presumed resection of the distal end of the right clavicle.  IMPRESSION: Worsening pulmonary edema, small layering bilateral effusions and associated bibasilar opacities, left greater than right, atelectasis versus infiltrate.   Electronically Signed   By: Sandi Mariscal M.D.   On: 08/07/2013 07:36   Dg Chest Port 1 View  08/06/2013   CLINICAL DATA:  68 year old male with shortness of breath, fever abdominal pain cyanosis. Initial encounter.  EXAM: PORTABLE CHEST - 1 VIEW  COMPARISON:  08/02/2013 and earlier.  FINDINGS: Portable AP semi upright view at 0612 hrs.  Extubated. Decreased bilateral veiling pulmonary opacity. Improved retrocardiac ventilation. Stable cardiac size and mediastinal contours. No pneumothorax. Stable pulmonary vascularity/interstitial opacity.  IMPRESSION: 1. Extubated. 2. Decreased appearance of bilateral pleural effusions and improved bibasilar ventilation. 3. Continued  pulmonary vascular congestion/interstitial edema.   Electronically Signed   By: Lars Pinks M.D.   On: 08/06/2013 06:31   Dg Chest Port 1 View  08/02/2013   CLINICAL DATA:  Respiratory failure.  EXAM: PORTABLE CHEST - 1 VIEW  COMPARISON:  DG CHEST 1V PORT dated 08/01/2013  FINDINGS: Endotracheal tube is approximately 3 cm above the carina. Degree of pulmonary edema at may be mildly improved. There are likely bilateral pleural effusions. The heart remains  moderately enlarged.  IMPRESSION: Mild decrease in pulmonary edema.   Electronically Signed   By: Aletta Edouard M.D.   On: 08/02/2013 07:40   Dg Chest Port 1 View  08/01/2013   CLINICAL DATA:  Status post re-intubation  EXAM: PORTABLE CHEST - 1 VIEW  COMPARISON:  05/22/2013  FINDINGS: An endotracheal tube is seen 3.5 cm above the carinal. The cardiac shadow is prominent. Vascular congestion with mild posteriorly layering effusions is identified. A temporary dialysis catheter is not seen on this film. Correlation with the clinical findings is recommended.  IMPRESSION: Vascular congestion and effusions which may be related to volume overload.  Endotracheal tube as described.   Electronically Signed   By: Inez Catalina M.D.   On: 08/01/2013 16:22   Ecg: NSR, late transition. T wave inversion in the lateral leads.   PHYSICAL EXAM General: Well developed, well nourished, in no acute distress. Head: Normal Neck: Negative for carotid bruits. JVD not elevated. Lungs: Clear bilaterally to auscultation without wheezes, rales, or rhonchi. Breathing is unlabored. Heart: RRR S1 S2 without murmurs, rubs, or gallops.  Abdomen: Soft, non-tender, non-distended with normoactive bowel sounds. No hepatomegaly. No  rebound/guarding. No obvious abdominal masses. Extremities: 1+ edema.  Distal pedal pulses are 2+ and equal bilaterally. Vascular access left arm. Neuro: Alert and oriented X 3. Moves all extremities spontaneously. Psych:  Responds to questions appropriately with a normal affect.  ASSESSMENT AND PLAN: 1. Troponin elevation. Suspect this is related to demand ischemia from sepsis, respiratory failure. In the setting of ESRD. No known history of CAD 2. ESRD on HD 3. Sepsis 4. Possible peritonitis. 5. Acute on chronic respiratory failure. 6. Acute on chronic diastolic CHF.  Troponin elevation is mild and has peaked. Await results of Echo. If LV function is preserved I would not recommend further  evaluation at this time in the absence of symptoms. Can follow up with Dr. Radford Pax as outpatient.   Principal Problem:   Severe sepsis(995.92) Active Problems:   Acute respiratory failure   Diabetes mellitus   HTN (hypertension)   ESRD (end stage renal disease) on dialysis   Anemia of chronic disease   Protein-calorie malnutrition, severe   Other malaise and fatigue   COPD GOLD II   Peritonitis   HCAP (healthcare-associated pneumonia)   CAPD (continuous ambulatory peritoneal dialysis) catheter in place    Signed, Martha Soltys [place] MD,FACC 08/07/2013 12:48 PM

## 2013-08-07 NOTE — Progress Notes (Signed)
Name: Victor Lane MRN: [id number] DOB: [birth year]    ADMISSION DATE:  08/06/2013 CONSULTATION DATE:  08/06/2013  REFERRING MD :  EDP PRIMARY SERVICE: PCCM  CHIEF COMPLAINT: Sepsis   BRIEF PATIENT DESCRIPTION: 68 year old with extensive PMH including severe cardiovascular disease, ESRD and vocal cord cancer , critical illness with resp failure 01/2013 w/ trach presenting to the ER 2/15 for abdominal pain, fever, weakness with suspected Sepsis . PCCM called for admission . Discharged from hospital 2/11 after Peritoneal Dialysis cath placed.   SIGNIFICANT EVENTS / STUDIES:  2/10 Peritoneal Dialysis Cath placed  2/15 CT abd/pelvis >>> Moderate size right pleural effusion and small left pleural effusion ,bibasilar airspace disease, small ascites, PD cath ,no acute abd findings, ?cirrhosis  2/15: Hep B neg 2/15: procalcitonin 35.6  LINES / TUBES:  PD cath 2/10 >>>  PIV 2/15>>  CULTURES:  2/15 BC >>> NGTD 2/15 UC >>> needs to be collected 2/15 strep pneumoniae urine>> needs to be collected 2/15 Legionella >> needs to be collected 2/15 Viral panel >> pending 2/15 Peritoneal >>> Needs to be collected  ANTIBIOTICS:  Levaquin x 1  Aztreonam 2/15 >>>  Flagyl 2/15 >>>  Vancomycin 2/15 >>>     SUBJECTIVE: no fevers, no pressors  VITAL SIGNS: Temp:  [97.1 F (36.2 C)-98.7 F (37.1 C)] 98.7 F (37.1 C) (02/16 0400) Pulse Rate:  [84-98] 95 (02/16 0600) Resp:  [12-24] 15 (02/16 0600) BP: (96-143)/(47-72) 128/62 mmHg (02/16 0600) SpO2:  [90 %-100 %] 94 % (02/16 0733) Weight:  [178 lb 5.6 oz (80.9 kg)-180 lb 5.4 oz (81.8 kg)] 178 lb 5.6 oz (80.9 kg) (02/16 0207) HEMODYNAMICS:   VENTILATOR SETTINGS:   INTAKE / OUTPUT: Intake/Output     02/15 0701 - 02/16 0700 02/16 0701 - 02/17 0700   P.O. 240    I.V. (mL/kg) 3726.7 (46.1)    IV Piggyback 800    Total Intake(mL/kg) 4766.7 (58.9)    Other 2490    Total Output 2490     Net +2276.7            PHYSICAL  EXAMINATION: General: Chronically ill appearing male  Neuro: A/o x 3  Cardiovascular: ST , no m/r/g  Lungs: Diminshed BS in bases , coarse rhonchi  Abdomen: gen tenderness, hypoactive BS , PD cath w/ dsg in tact and dry, drain / catheter tip protruding form the wound  Musculoskeletal: +2 edema, no erythema or tenderness LE.  Skin: Intact   LABS:  CBC  Recent Labs Lab 08/06/13 0555 08/06/13 1219 08/07/13 0144  WBC 5.9 8.9 10.3  HGB 14.0 12.1* 11.6*  HCT 44.8 38.6* 36.5*  PLT 192 100* 111*   Coag's No results found for this basename: APTT, INR,  in the last 168 hours BMET  Recent Labs Lab 08/06/13 0555 08/06/13 1219 08/07/13 0144  NA 137 137 137  K 6.4* 4.3 3.2*  CL 88* 95* 95*  CO2 16* 19 27  BUN 35* 41* 20  CREATININE 5.76* 5.77*  5.92* 3.39*  GLUCOSE 71 89 103*   Electrolytes  Recent Labs Lab 08/02/13 0220 08/06/13 0555 08/06/13 1219 08/07/13 0144  CALCIUM 8.6 9.9 8.6 8.2*  MG 2.1  --   --   --   PHOS 5.3*  --   --   --    Sepsis Markers  Recent Labs Lab 08/06/13 0555 08/06/13 0614 08/06/13 0819 08/06/13 1219 08/07/13 0144  LATICACIDVEN  --  12.77* 6.34*  --  2.0  PROCALCITON 0.82  --   --  34.60  --    ABG  Recent Labs Lab 08/01/13 1737 08/02/13 0333  PHART 7.445 7.548*  PCO2ART 41.2 31.6*  PO2ART 74.0* 69.0*   Liver Enzymes  Recent Labs Lab 08/06/13 0555  AST 81*  ALT 31  ALKPHOS 128*  BILITOT 1.1  ALBUMIN 3.2*   Cardiac Enzymes  Recent Labs Lab 08/06/13 1219 08/06/13 1935 08/07/13 0144  TROPONINI 0.94* 1.31* 1.19*   Glucose  Recent Labs Lab 08/06/13 1505 08/06/13 1912 08/06/13 2351 08/07/13 0020 08/07/13 0159 08/07/13 0338  GLUCAP 85 86 66* 69* 96 104*    Imaging Ct Abdomen Pelvis Wo Contrast  08/06/2013   CLINICAL DATA:  Abdominal pain and fever. Recent surgical procedure for peritoneal dialysis 5 days ago.  EXAM: CT ABDOMEN AND PELVIS WITHOUT CONTRAST  TECHNIQUE: Multidetector CT imaging of the abdomen  and pelvis was performed following the standard protocol without intravenous contrast.  COMPARISON:  None.  FINDINGS: Lung bases demonstrate a moderate size right pleural effusion and small left pleural effusion with associated airspace opacification which may be due to atelectasis or infection. There is mild cardiomegaly.  Abdominal images demonstrate evidence of a prior cholecystectomy. There is a small amount of perihepatic fluid. There is mild fatty infiltration of the liver with subtle nodular contour. The pancreas and adrenal glands are unremarkable. Appendix is within normal. Kidneys are normal in size with bilateral renal cortical thinning. There is a 6 cm exophytic simple cyst over the mid pole of the left kidney. There are a few small of bilateral hypodensities likely cysts. There is a 1.1 cm homogeneously hyperdense mass over the lower pole of the left kidney with Hounsfield unit measurements 45 likely a hemorrhagic cyst. Ureters are within normal. There is no hydronephrosis or nephrolithiasis. There is diverticulosis of the colon most prominent over the sigmoid colon. There is a peritoneal dialysis catheter entering the left mid abdomen with tip coiled over the pelvis between loops of rectosigmoid colon. There is moderate calcified plaque of the thoracoabdominal aorta and iliac arteries. Minimal free fluid over the right pericolic gutter. No evidence of free peritoneal air.  Remaining pelvic structures are notable only for moderate fecal retention over the rectosigmoid colon. There is mild degenerative change of the spine and hips.  IMPRESSION: Moderate size right pleural effusion and small left pleural effusion with associated airspace opacification in the lower lobes which may be due to pneumonia or atelectasis.  Peritoneal dialysis catheter with tip coiled over the pelvis between several recto sigmoid colonic loops.  Changes involving the liver which may be due to cirrhosis. Small amount of ascites.   Bilateral renal cortical thinning and bilateral renal cysts. 1.1 cm homogeneously hyperdense left renal mass likely a hemorrhagic cyst. Recommend followup CT 6 months.  Diverticulosis of the colon. Moderate fecal retention over the rectosigmoid colon.  Small hiatal hernia.   Electronically Signed   By: Marin Olp M.D.   On: 08/06/2013 07:57   Dg Chest Port 1 View  08/07/2013   CLINICAL DATA:  Shortness of breath, pneumonia  EXAM: PORTABLE CHEST - 1 VIEW  COMPARISON:  DG CHEST 1V PORT dated 08/06/2013; DG CHEST 1V PORT dated 08/02/2013; DG CHEST 2 VIEW dated 04/25/2013; DG CHEST 1V PORT dated 02/16/2013  FINDINGS: Grossly unchanged enlarged cardiac silhouette and mediastinal contours with atherosclerotic plaque within the thoracic aorta. The pulmonary vasculature is indistinct with cephalization of flow. Interval increase in now small layering bilateral effusions with associated  worsening bibasilar opacities, left greater than right. No definite evidence of edema. Grossly unchanged bones including presumed resection of the distal end of the right clavicle.  IMPRESSION: Worsening pulmonary edema, small layering bilateral effusions and associated bibasilar opacities, left greater than right, atelectasis versus infiltrate.   Electronically Signed   By: Sandi Mariscal M.D.   On: 08/07/2013 07:36   Dg Chest Port 1 View  08/06/2013   CLINICAL DATA:  68 year old male with shortness of breath, fever abdominal pain cyanosis. Initial encounter.  EXAM: PORTABLE CHEST - 1 VIEW  COMPARISON:  08/02/2013 and earlier.  FINDINGS: Portable AP semi upright view at 0612 hrs.  Extubated. Decreased bilateral veiling pulmonary opacity. Improved retrocardiac ventilation. Stable cardiac size and mediastinal contours. No pneumothorax. Stable pulmonary vascularity/interstitial opacity.  IMPRESSION: 1. Extubated. 2. Decreased appearance of bilateral pleural effusions and improved bibasilar ventilation. 3. Continued  pulmonary vascular  congestion/interstitial edema.   Electronically Signed   By: Lars Pinks M.D.   On: 08/06/2013 06:31    CXR: Worsening pulmonary edema, small layering bilateral effusions and associated bibasilar opacities, left greater than right, atelectasis versus infiltrate.   ASSESSMENT / PLAN:  PULMONARY  A:  Acute on chronic respiratory failure  Possible HCAP  Bilateral pleural effusions  COPD on nocturnal O2 w/out exacerbation  2/16 pulm edema P:  Goal SpO2>92  Supplemental oxygen PRN  Albuterol / Ipratropium PRN Need neg balance for edema on pcxr  CARDIOVASCULAR  A:  Severe sepsis improved Chronic diastolic heart failure  Hx HTN  NSTEMI: Most likely from demand ischemia P:  Goal MAP > 65  Lactate (12.77 max) and trop (1.31 max) trending down today  Hold ASA, Lopressor  Echo pending Cardiology consulted   RENAL  A:  ESRD on HD - MWF  PD cath  Hyperkalemia  Lactic acidosis  P:  HD today UOP recorded as zero yesterday.  Consult Renal  Trend BMP   GASTROINTESTINAL  A:  Suspected peritonitis  GERD  S/p PD cath placement 2/10: Flushing well, healing well externally (? Extension tubing pulled off at home) Peritonitis Constipation P:  KVO NS Heart healthy diet Preadmission Protonix Mylanta PRN Dulcolax/lactulose/Miralax Florastor IV intraperitoneal ABX, if fails to improve condition will need to remove PD cath in OR - has improved thus far Consult Surgery   HEMATOLOGIC  A:  VTE Px  P:  Trend CBC  Heparin SQ  INFECTIOUS  A:  Suspected peritonitis  Suspected HCAP  PCN allergy  P:  Cx / abx as above Will discuss course abx with renal May need ID consult Dc further pct  ENDOCRINE  A:  DM  Hypoglycemia P:  SSI  CBG 69-104; D50 IV as needed for hypoglycemia Hold Glipizide   NEUROLOGIC  A:  Pain  P:  Morphine PRN  Dilaudid PRN for pain  To triad To floor  08/06/2013, 12:12 PM  [place], [place] DO PGY-2 08/07/2013, 7:52 AM  I have fully  examined this patient and agree with above findings.     Lavon Paganini. Titus Mould, MD, [place] Pgr: [facility]

## 2013-08-07 NOTE — Progress Notes (Signed)
ANTIBIOTIC CONSULT NOTE - FOLLOW UP  Pharmacy Consult for Vancomycin, Flagyl, Aztreonam  Indication: HCAP, sepsis    Allergies  Allergen Reactions  . Amoxicillin     Causes upset stomach  . Fentanyl     Confusion when patch applied  . Nsaids Other (See Comments)    Severe kidney disease/ESRD    Patient Measurements: Height: 5\' 10"  (177.8 cm) Weight: 176 lb 9.4 oz (80.1 kg) IBW/kg (Calculated) : 73  Vital Signs: Temp: 97.9 F (36.6 C) (02/16 1230) Temp src: Oral (02/16 1230) BP: 118/79 mmHg (02/16 1330) Pulse Rate: 97 (02/16 1330) Intake/Output from previous day: 02/15 0701 - 02/16 0700 In: 4776.7 [P.O.:240; I.V.:3736.7; IV Piggyback:800] Out: 2490  Intake/Output from this shift: Total I/O In: 20 [I.V.:20] Out: -   Labs:  Recent Labs  08/06/13 0555 08/06/13 1219 08/07/13 0144  WBC 5.9 8.9 10.3  HGB 14.0 12.1* 11.6*  PLT 192 100* 111*  CREATININE 5.76* 5.77*  5.92* 3.39*   Estimated Creatinine Clearance: 21.8 ml/min (by C-G formula based on Cr of 3.39). No results found for this basename: VANCOTROUGH, Corlis Leak, VANCORANDOM, [place], GENTPEAK, GENTRANDOM, TOBRATROUGH, TOBRAPEAK, TOBRARND, AMIKACINPEAK, AMIKACINTROU, AMIKACIN,  in the last 72 hours   Microbiology: Recent Results (from the past 720 hour(s))  MRSA PCR SCREENING     Status: None   Collection Time    08/01/13  5:41 PM      Result Value Ref Range Status   MRSA by PCR NEGATIVE  NEGATIVE Final   Comment:            The GeneXpert MRSA Assay (FDA     approved for NASAL specimens     only), is one component of a     comprehensive MRSA colonization     surveillance program. It is not     intended to diagnose MRSA     infection nor to guide or     monitor treatment for     MRSA infections.  CULTURE, BLOOD (ROUTINE X 2)     Status: None   Collection Time    08/06/13  5:55 AM      Result Value Ref Range Status   Specimen Description BLOOD FOREARM RIGHT   Final   Special Requests BOTTLES  DRAWN AEROBIC AND ANAEROBIC 6ML   Final   Culture  Setup Time     Final   Value: 08/06/2013 13:29     Performed at [facility]   Culture     Final   Value:        BLOOD CULTURE RECEIVED NO GROWTH TO DATE CULTURE WILL BE HELD FOR 5 DAYS BEFORE ISSUING A FINAL NEGATIVE REPORT     Performed at [facility]   Report Status PENDING   Incomplete  CULTURE, BLOOD (ROUTINE X 2)     Status: None   Collection Time    08/06/13  6:30 AM      Result Value Ref Range Status   Specimen Description BLOOD RIGHT FOREARM   Final   Special Requests BOTTLES DRAWN AEROBIC AND ANAEROBIC 6CC   Final   Culture  Setup Time     Final   Value: 08/06/2013 17:59     Performed at [facility]   Culture     Final   Value:        BLOOD CULTURE RECEIVED NO GROWTH TO DATE CULTURE WILL BE HELD FOR 5 DAYS BEFORE ISSUING A FINAL NEGATIVE REPORT     Performed  at [facility]   Report Status PENDING   Incomplete  MRSA PCR SCREENING     Status: None   Collection Time    08/06/13 10:42 AM      Result Value Ref Range Status   MRSA by PCR NEGATIVE  NEGATIVE Final   Comment:            The GeneXpert MRSA Assay (FDA     approved for NASAL specimens     only), is one component of a     comprehensive MRSA colonization     surveillance program. It is not     intended to diagnose MRSA     infection nor to guide or     monitor treatment for     MRSA infections.    Anti-infectives   Start     Dose/Rate Route Frequency Ordered Stop   08/07/13 1600  vancomycin (VANCOCIN) IVPB 750 mg/150 ml premix     750 mg 150 mL/hr over 60 Minutes Intravenous  Once 08/07/13 1346     08/07/13 1100  aztreonam (AZACTAM) 1 g in dextrose 5 % 50 mL IVPB     1 g 100 mL/hr over 30 Minutes Intravenous Every 24 hours 08/06/13 0955     08/06/13 2000  vancomycin (VANCOCIN) IVPB 750 mg/150 ml premix     750 mg 150 mL/hr over 60 Minutes Intravenous  Once 08/06/13 1718 08/06/13 2343   08/06/13 1400  metroNIDAZOLE  (FLAGYL) IVPB 500 mg     500 mg 100 mL/hr over 60 Minutes Intravenous Every 8 hours 08/06/13 0955     08/06/13 1030  vancomycin (VANCOCIN) 1,500 mg in sodium chloride 0.9 % 500 mL IVPB     1,500 mg 250 mL/hr over 120 Minutes Intravenous NOW 08/06/13 0955 08/06/13 1257   08/06/13 1030  aztreonam (AZACTAM) 1 g in dextrose 5 % 50 mL IVPB     1 g 100 mL/hr over 30 Minutes Intravenous NOW 08/06/13 0955 08/06/13 1127   08/06/13 0630  levofloxacin (LEVAQUIN) IVPB 750 mg     750 mg 100 mL/hr over 90 Minutes Intravenous  Once 08/06/13 0618 08/06/13 0806   08/06/13 0630  metroNIDAZOLE (FLAGYL) IVPB 500 mg     500 mg 100 mL/hr over 60 Minutes Intravenous  Once 08/06/13 0618 08/06/13 0737      Assessment: 68yo male with HCAP, sepsis on vanc/azactam and Flagyl. Patient received HD yesterday and is currently getting another session now. No growth on cultures. WBC increased to 10.3, afebrile.  Goal of Therapy:  Pre-HD Vancomycin level 15-25   Plan:  1. Vancomycin 750mg  IV post HD today- HD RN aware 2. Continue Aztreonam 1g IV q24h 3. Continue Flagyl 500mg  IV q8h 4. Follow for HD schedule, clinical progression, c/s, LOT  Cameron Katayama D. Jazmarie Biever, PharmD, BCPS Clinical Pharmacist Pager: [phone] 08/07/2013 1:55 PM

## 2013-08-07 NOTE — Progress Notes (Signed)
Patient arrived from 57[facility] in stable conditiono.  Jillyn Ledger, MBA, BS, RN

## 2013-08-07 NOTE — Progress Notes (Signed)
INITIAL NUTRITION ASSESSMENT  DOCUMENTATION CODES Per approved criteria  -Not Applicable   INTERVENTION:  Nepro Shake po daily, each supplement provides 425 kcal and 19 grams protein.  NUTRITION DIAGNOSIS: Increased nutrient needs related to ESRD as evidenced by estimated nutrition needs 30 kcals/kg and 1.2-1.4 gm protein/kg.   Goal: Intake to meet >90% of estimated nutrition needs.  Monitor:  PO intake, labs, weight trend.  Reason for Assessment: MST  68 y.o. male  Admitting Dx: Severe sepsis(995.92)  ASSESSMENT: Patient is a 68 year old with extensive PMH including severe cardiovascular disease, ESRD and vocal cord cancer , critical illness with resp failure 01/2013 w/ trach presenting to the ER 2/15 for abdominal pain, fever, weakness with suspected Sepsis. Discharged from hospital 2/11 after Peritoneal Dialysis cath placed.   Patient reports poor oral intake PTA due to poor appetite and altered taste. He thinks that dialysis causes all foods to taste like cardboard. He endorses a 50 lb weight loss over the past year. Weight has been stable for the past 3 months. Patient said he was hungry at breakfast today, he ate 100% of his breakfast.  Nutrition Focused Physical Exam:  Subcutaneous Fat:  Orbital Region: WNL Upper Arm Region: WNL Thoracic and Lumbar Region: WNL  Muscle:  Temple Region: WNL Clavicle Bone Region: mild depletion Clavicle and Acromion Bone Region: mild depletion Scapular Bone Region: WNL Dorsal Hand: WNL Patellar Region: WNL Anterior Thigh Region: WNL Posterior Calf Region: WNL  Edema: none in LE   Height: Ht Readings from Last 1 Encounters:  08/06/13 5\' 10"  (1.778 m)    Weight: Wt Readings from Last 1 Encounters:  08/07/13 178 lb 5.6 oz (80.9 kg)    Ideal Body Weight: 75.5 kg  % Ideal Body Weight: 107%  Wt Readings from Last 10 Encounters:  08/07/13 178 lb 5.6 oz (80.9 kg)  08/02/13 173 lb 15.1 oz (78.9 kg)  08/02/13 173 lb 15.1 oz  (78.9 kg)  07/27/13 179 lb 8 oz (81.421 kg)  07/25/13 180 lb 6.4 oz (81.829 kg)  07/11/13 178 lb (80.74 kg)  06/29/13 180 lb (81.647 kg)  05/24/13 171 lb 15.3 oz (78 kg)  05/09/13 175 lb (79.379 kg)  04/25/13 178 lb (80.74 kg)    Usual Body Weight: 178 lb 3 months ago; 230 lb 1 year ago  % Usual Body Weight: 100%  BMI:  Body mass index is 25.59 kg/(m^2).  Estimated Nutritional Needs: Kcal: 1540-0867 Protein: 95-110 gm Fluid: 1.2 L  Skin: abdominal incision  Diet Order:  Heart Healthy/CHO-modified  EDUCATION NEEDS: -Education not appropriate at this time   Intake/Output Summary (Last 24 hours) at 08/07/13 0957 Last data filed at 08/07/13 0900  Gross per 24 hour  Intake 2796.67 ml  Output   2490 ml  Net 306.67 ml    Last BM: 2/14   Labs:   Recent Labs Lab 08/02/13 0220 08/06/13 0555 08/06/13 1219 08/07/13 0144  NA 141 137 137 137  K 3.6* 6.4* 4.3 3.2*  CL 96 88* 95* 95*  CO2 25 16* 19 27  BUN 48* 35* 41* 20  CREATININE 6.26* 5.76* 5.77*  5.92* 3.39*  CALCIUM 8.6 9.9 8.6 8.2*  MG 2.1  --   --   --   PHOS 5.3*  --   --   --   GLUCOSE 84 71 89 103*    CBG (last 3)   Recent Labs  08/07/13 0159 08/07/13 0338 08/07/13 0752  GLUCAP 96 104* 89  Scheduled Meds: . aztreonam  1 g Intravenous Q24H  . heparin  5,000 Units Subcutaneous 3 times per day  . insulin aspart  1-3 Units Subcutaneous 6 times per day  . ipratropium-albuterol  3 mL Nebulization Q6H  . lip balm  1 application Topical BID  . metronidazole  500 mg Intravenous Q8H  . pantoprazole  40 mg Oral Daily  . polyethylene glycol  17 g Oral BID  . saccharomyces boulardii  250 mg Oral BID    Continuous Infusions: . sodium chloride 10 mL/hr at 08/06/13 2200    Past Medical History  Diagnosis Date  . Chest pain     non-ischemic stress test 02/2011 (Dr. Radford Pax)  . Carotid artery occlusion   . Neuropathy   . Arthritis   . H/O vitamin D deficiency   . Hearing loss   .  Hypercholesteremia   . 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 S/P radiation therapy 01/24/2013-03/14/2013    Larynx/glottis / 63 Gy in 28 fractions  . Hypertension     takes Metoprolol daily  . GERD (gastroesophageal reflux disease)     takes Nexium daily  . Complication of anesthesia     hard to wake up and then panics waking up  . Family history of anesthesia complication     Adopted  . Sleep apnea     to request from Dr.Fried was done about 66yrs ago but no cpap d/t weight loss  . Pneumonia 01/2013  . Weakness     over whole body  . Fibromyalgia     takes tramadol daily as needed  . Myalgia and myositis   . Peripheral neuropathy     was on Lyrica but has been off x a week(Was only on for 3days)  . History of gout   . Rash     both lower legs and feet and using Mupirocin  . History of colon polyps   . Chronic kidney disease     - Stage 5- follwed by Dr Sharlotte Alamo in [place]  . Diabetes mellitus     takes Metformin when needed d/t weight loss  . Cataracts, bilateral     immature  . COPD (chronic obstructive pulmonary disease)     Past Surgical History  Procedure Laterality Date  . Cholecystectomy  1993  . Rotator cuff repair  07/06/2011    right  . Carotid endarterectomy Left   . Coolonoscopy    . Colonoscopy w/ biopsies and polypectomy      Hx: of  . Microlaryngoscopy with laser N/A 01/04/2013    Procedure: MICROLARYNGOSCOPY WITH BIOPSY/LASER;  Surgeon: [name], MD;  Location: [facility];  Service: ENT;  Laterality: N/A;  . Av fistula placement Left 02/23/2013    Procedure: ARTERIOVENOUS (AV) FISTULA CREATION;  Surgeon: Conrad Fairfield, MD;  Location: [facility];  Service: Vascular;  Laterality: Left;  . Capd insertion N/A 08/01/2013    Procedure: LAPAROSCOPIC INSERTION CONTINUOUS AMBULATORY PERITONEAL DIALYSIS CATHETER;  Surgeon: [name], MD;  Location: [place];  Service: General;  Laterality: N/A;  .  Laparoscopic abdominal exploration N/A 08/01/2013    Procedure: LAPAROSCOPIC ABDOMINAL EXPLORATION;  Surgeon: [name], MD;  Location: [facility];  Service: General;  Laterality: N/A;    Molli Barrows, [place], [place], [place] Pager [phone] After Hours Pager [phone]

## 2013-08-07 NOTE — Procedures (Deleted)
Central Venous Catheter Insertion Procedure Note SEVILLE DOWNS [id number] 1946/06/09  Procedure: Insertion of Central Venous Catheter Indications: Assessment of intravascular volume, Drug and/or fluid administration and Frequent blood sampling  Procedure Details Consent: Risks of procedure as well as the alternatives and risks of each were explained to the (patient/caregiver).  Consent for procedure obtained. Time Out: Verified patient identification, verified procedure, site/side was marked, verified correct patient position, special equipment/implants available, medications/allergies/relevent history reviewed, required imaging and test results available.  Performed  Maximum sterile technique was used including antiseptics, cap, gloves, gown, hand hygiene, mask and sheet. Skin prep: Chlorhexidine; local anesthetic administered A antimicrobial bonded/coated triple lumen catheter was placed in the left internal jugular vein using the Seldinger technique.  Evaluation Blood flow good Complications: No apparent complications Patient did tolerate procedure well. Chest X-ray ordered to verify placement.  CXR: pending.  Raylene Miyamoto 08/07/2013, 2:29 PM  [place] with Aldine Titus Mould, MD, [place] Pgr: [facility]

## 2013-08-07 NOTE — Procedures (Signed)
Tolerating hemodialysis today.  No instability. Has infected PD catheter and is clinically improving.  Reports severe post dialysis fatigue at kidney center but not in hospital.  Has 1+ LE edema.  Prob will need EDW adjustment before discharge. [address]     Assessment/Plan:  1. Abdominal pain/Peritonitis - fever & chills, PD catheter placed 2/10, now with purulent drainage and ? PD catheter displacement; IV Aztreonam, Flagyl & Vancomycin started. Surgery has seen. External tubing replaced sterilely. Will replace PD cath in OR if peritonitis does not resolve. 2. Hyperkalemia - K 6.4 on admit, Resolved with HD  3. ESRD - MWF, HD today as a separate. 4. HyPOtension/volume - SBPs 110s s/p IVF. No pressors. Home Metoprolol 12.5 mg bid on hold; Vol xs on exam. Trying for UF 3.5L today as BP will allow. May need an additional tx tomorrow. 5. Anemia - Hgb 11.6, last T-sat 38% with ferritin 1936; no outpatient Epogen or Fe. 6. Metabolic bone disease - Ca 8.2(~8.8 corrected), last P 5.3, iPTH 180; Hectorol 5 mcg, Hold Renvela 3 pending resolutiowith meals. 7. Nutrition - Alb 3.2, Renal/carb mod diet, multivitamin. 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 9. 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 [address]. [address]  [facility]  Pager [phone]  08/07/2013,1:01 PM  LOS: 1 day

## 2013-08-07 NOTE — Progress Notes (Signed)
[address] Lane [id number] February 25, 1946  CARE TEAM:  PCP: [name], MD  Outpatient Care Team: Patient Care Team: [name], MD as PCP - General (Family Medicine) Sueanne Margarita, MD (Cardiology) Brooks Sailors, RN as Registered Nurse (Oncology) Tanda Rockers, MD as Consulting Physician (Pulmonary Disease) Lucrezia Starch, MD as Consulting Physician (Nephrology)  Inpatient Treatment Team: Treatment Team: Attending Provider: Doree Fudge, MD; Technician: Mindi Curling, NT; Rounding Team: Md Pccm, MD; Consulting Physician: Sol Blazing, MD; Consulting Physician: [name], MD; Registered Nurse: Theone Stanley, RN; Consulting Physician: Michae Kava Lbcardiology, MD; Registered Nurse: Lorenso Courier, RN   Subjective:  Tol some PO Abd pain less  Objective:  Vital signs:  Filed Vitals:   08/07/13 0400 08/07/13 0500 08/07/13 0600 08/07/13 0733  BP: 131/61 127/58 128/62   Pulse: 96 95 95   Temp: 98.7 F (37.1 C)     TempSrc: Oral     Resp: _0 Height:      Weight:      SpO2: 94% 96% 95% 94%    Last BM Date: 08/05/13  Intake/Output   Yesterday:  02/15 0701 - 02/16 0700 In: 4766.7 [P.O.:240; I.V.:3726.7; IV Piggyback:800] Out: 2490  This shift:     Bowel function:  Flatus: y  BM: n  Drain: CAPD exit site clean & dry  Physical Exam:  General: Pt awake/alert/oriented x4 in no acute distress Eyes: PERRL, normal EOM.  Sclera clear.  No icterus Neuro: CN II-XII intact w/o focal sensory/motor deficits. Lymph: No head/neck/groin lymphadenopathy Psych:  No delerium/psychosis/paranoia HENT: Normocephalic, Mucus membranes moist.  No thrush Neck: Supple, No tracheal deviation Chest: No chest wall pain w good excursion CV:  Pulses intact.  Regular rhythm MS: Normal AROM mjr joints.  No obvious deformity Abdomen: Soft.  Nondistended.  Mild diffuse TTP - milder peritonitis.  No incarcerated hernias. Ext:  SCDs BLE.  No  mjr edema.  No cyanosis Skin: No petechiae / purpura   Problem List:   Principal Problem:   Severe sepsis(995.92) Active Problems:   Acute respiratory failure   Diabetes mellitus   HTN (hypertension)   ESRD (end stage renal disease) on dialysis   Anemia of chronic disease   Protein-calorie malnutrition, severe   Other malaise and fatigue   COPD GOLD II   Peritonitis   HCAP (healthcare-associated pneumonia)   CAPD (continuous ambulatory peritoneal dialysis) catheter in place   Assessment  Victor Lane  68 y.o. male       FAIR but Stabilizing   Plan:  -cont IV ABx -CAPD care - consider CAPD RN eval - defer to nephrology  -try to keep CAPD catheter in - if peritonitis not resolving or worsens, remove CAPD catheter & regroup  -VTE prophylaxis- SCDs, etc -mobilize as tolerated to help recovery  [name], M.D., F.A.C.S. Gastrointestinal and Minimally Invasive Surgery [facility] [address] [place], [place] [phone] Main / Paging   08/07/2013   Results:   Labs: Results for orders placed during the hospital encounter of 08/06/13 (from the past 48 hour(s))  CBC WITH DIFFERENTIAL     Status: Abnormal   Collection Time    08/06/13  5:55 AM      Result Value Ref Range   WBC 5.9  4.0 - 10.5 K/uL   RBC 4.45  4.22 - 5.81 MIL/uL   Hemoglobin 14.0  13.0 - 17.0 g/dL  HCT 44.8  39.0 - 52.0 %   MCV 100.7 (*) 78.0 - 100.0 fL   MCH 31.5  26.0 - 34.0 pg   MCHC 31.3  30.0 - 36.0 g/dL   RDW 19.5 (*) 11.5 - 15.5 %   Platelets 192  150 - 400 K/uL   Neutrophils Relative % 71  43 - 77 %   Neutro Abs 4.1  1.7 - 7.7 K/uL   Lymphocytes Relative 22  12 - 46 %   Lymphs Abs 1.3  0.7 - 4.0 K/uL   Monocytes Relative 7  3 - 12 %   Monocytes Absolute 0.4  0.1 - 1.0 K/uL   Eosinophils Relative 0  0 - 5 %   Eosinophils Absolute 0.0  0.0 - 0.7 K/uL   Basophils Relative 0  0 - 1 %   Basophils Absolute 0.0  0.0 - 0.1 K/uL   COMPREHENSIVE METABOLIC PANEL     Status: Abnormal   Collection Time    08/06/13  5:55 AM      Result Value Ref Range   Sodium 137  137 - 147 mEq/L   Potassium 6.4 (*) 3.7 - 5.3 mEq/L   Comment: HEMOLYSIS AT THIS LEVEL MAY AFFECT RESULT   Chloride 88 (*) 96 - 112 mEq/L   CO2 16 (*) 19 - 32 mEq/L   Glucose, Bld 71  70 - 99 mg/dL   BUN 35 (*) 6 - 23 mg/dL   Creatinine, Ser 5.76 (*) 0.50 - 1.35 mg/dL   Calcium 9.9  8.4 - 10.5 mg/dL   Total Protein 7.3  6.0 - 8.3 g/dL   Albumin 3.2 (*) 3.5 - 5.2 g/dL   AST 81 (*) 0 - 37 U/L   Comment: HEMOLYSIS AT THIS LEVEL MAY AFFECT RESULT   ALT 31  0 - 53 U/L   Comment: HEMOLYSIS AT THIS LEVEL MAY AFFECT RESULT   Alkaline Phosphatase 128 (*) 39 - 117 U/L   Comment: HEMOLYSIS AT THIS LEVEL MAY AFFECT RESULT   Total Bilirubin 1.1  0.3 - 1.2 mg/dL   GFR calc non Af Amer 9 (*) >90 mL/min   GFR calc Af Amer 11 (*) >90 mL/min   Comment: (NOTE)     The eGFR has been calculated using the CKD EPI equation.     This calculation has not been validated in all clinical situations.     eGFR's persistently <90 mL/min signify possible Chronic Kidney     Disease.  PROCALCITONIN     Status: None   Collection Time    08/06/13  5:55 AM      Result Value Ref Range   Procalcitonin 0.82     Comment:            Interpretation:     PCT > 0.5 ng/mL and <= 2 ng/mL:     Systemic infection (sepsis) is possible,     but other conditions are known to elevate     PCT as well.     (NOTE)             ICU PCT Algorithm               Non ICU PCT Algorithm        ----------------------------     ------------------------------             PCT < 0.25 ng/mL                 PCT < 0.1 ng/mL  Stopping of antibiotics            Stopping of antibiotics           strongly encouraged.               strongly encouraged.        ----------------------------     ------------------------------           PCT level decrease by               PCT < 0.25 ng/mL           >= 80% from  peak PCT           OR PCT 0.25 - 0.5 ng/mL          Stopping of antibiotics                                                 encouraged.         Stopping of antibiotics               encouraged.        ----------------------------     ------------------------------           PCT level decrease by              PCT >= 0.25 ng/mL           < 80% from peak PCT            AND PCT >= 0.5 ng/mL            Continuing antibiotics                                                  encouraged.           Continuing antibiotics                encouraged.        ----------------------------     ------------------------------         PCT level increase compared          PCT > 0.5 ng/mL             with peak PCT AND              PCT >= 0.5 ng/mL             Escalation of antibiotics                                              strongly encouraged.          Escalation of antibiotics            strongly encouraged.  CG4 I-STAT (LACTIC ACID)     Status: Abnormal   Collection Time    08/06/13  6:14 AM      Result Value Ref Range   Lactic Acid, Venous 12.77 (*) 0.5 - 2.2 mmol/L  CG4 I-STAT (LACTIC ACID)     Status: Abnormal   Collection Time    08/06/13  8:19 AM  Result Value Ref Range   Lactic Acid, Venous 6.34 (*) 0.5 - 2.2 mmol/L  GLUCOSE, CAPILLARY     Status: None   Collection Time    08/06/13 10:33 AM      Result Value Ref Range   Glucose-Capillary 75  70 - 99 mg/dL  MRSA PCR SCREENING     Status: None   Collection Time    08/06/13 10:42 AM      Result Value Ref Range   MRSA by PCR NEGATIVE  NEGATIVE   Comment:            The GeneXpert MRSA Assay (FDA     approved for NASAL specimens     only), is one component of a     comprehensive MRSA colonization     surveillance program. It is not     intended to diagnose MRSA     infection nor to guide or     monitor treatment for     MRSA infections.  CBC     Status: Abnormal   Collection Time    08/06/13 12:19 PM      Result Value Ref  Range   WBC 8.9  4.0 - 10.5 K/uL   RBC 3.95 (*) 4.22 - 5.81 MIL/uL   Hemoglobin 12.1 (*) 13.0 - 17.0 g/dL   HCT 38.6 (*) 39.0 - 52.0 %   MCV 97.7  78.0 - 100.0 fL   MCH 30.6  26.0 - 34.0 pg   MCHC 31.3  30.0 - 36.0 g/dL   RDW 19.0 (*) 11.5 - 15.5 %   Platelets 100 (*) 150 - 400 K/uL   Comment: PLATELET COUNT CONFIRMED BY SMEAR     LARGE PLATELETS PRESENT  CREATININE, SERUM     Status: Abnormal   Collection Time    08/06/13 12:19 PM      Result Value Ref Range   Creatinine, Ser 5.77 (*) 0.50 - 1.35 mg/dL   GFR calc non Af Amer 9 (*) >90 mL/min   GFR calc Af Amer 11 (*) >90 mL/min   Comment: (NOTE)     The eGFR has been calculated using the CKD EPI equation.     This calculation has not been validated in all clinical situations.     eGFR's persistently <90 mL/min signify possible Chronic Kidney     Disease.  TROPONIN I     Status: Abnormal   Collection Time    08/06/13 12:19 PM      Result Value Ref Range   Troponin I 0.94 (*) <0.30 ng/mL   Comment:            Due to the release kinetics of cTnI,     a negative result within the first hours     of the onset of symptoms does not rule out     myocardial infarction with certainty.     If myocardial infarction is still suspected,     repeat the test at appropriate intervals.     CRITICAL RESULT CALLED TO, READ BACK BY AND VERIFIED WITH:     MACASERO,E RN 08/06/13 130[address] [place]  AMYLASE     Status: Abnormal   Collection Time    08/06/13 12:19 PM      Result Value Ref Range   Amylase 1053 (*) 0 - 105 U/L  LIPASE, BLOOD     Status: None   Collection Time    08/06/13 12:19 PM      Result Value Ref Range  Lipase 34  11 - 59 U/L  PROCALCITONIN     Status: None   Collection Time    08/06/13 12:19 PM      Result Value Ref Range   Procalcitonin 34.60     Comment:            Interpretation:     PCT >= 10 ng/mL:     Important systemic inflammatory response,     almost exclusively due to severe bacterial     sepsis or septic  shock.     (NOTE)             ICU PCT Algorithm               Non ICU PCT Algorithm        ----------------------------     ------------------------------             PCT < 0.25 ng/mL                 PCT < 0.1 ng/mL         Stopping of antibiotics            Stopping of antibiotics           strongly encouraged.               strongly encouraged.        ----------------------------     ------------------------------           PCT level decrease by               PCT < 0.25 ng/mL           >= 80% from peak PCT           OR PCT 0.25 - 0.5 ng/mL          Stopping of antibiotics                                                 encouraged.         Stopping of antibiotics               encouraged.        ----------------------------     ------------------------------           PCT level decrease by              PCT >= 0.25 ng/mL           < 80% from peak PCT            AND PCT >= 0.5 ng/mL            Continuing antibiotics                                                  encouraged.           Continuing antibiotics                encouraged.        ----------------------------     ------------------------------         PCT level increase compared          PCT > 0.5 ng/mL  with peak PCT AND              PCT >= 0.5 ng/mL             Escalation of antibiotics                                              strongly encouraged.          Escalation of antibiotics            strongly encouraged.  CORTISOL     Status: None   Collection Time    08/06/13 12:19 PM      Result Value Ref Range   Cortisol, Plasma 37.8     Comment: (NOTE)     AM:  4.3 - 22.4 ug/dL     PM:  3.1 - 16.7 ug/dL     Performed at [place]     Status: Abnormal   Collection Time    08/06/13 12:19 PM      Result Value Ref Range   Sodium 137  137 - 147 mEq/L   Potassium 4.3  3.7 - 5.3 mEq/L   Comment: DELTA CHECK NOTED   Chloride 95 (*) 96 - 112 mEq/L   CO2 19  19 - 32 mEq/L    Glucose, Bld 89  70 - 99 mg/dL   BUN 41 (*) 6 - 23 mg/dL   Creatinine, Ser 5.92 (*) 0.50 - 1.35 mg/dL   Calcium 8.6  8.4 - 10.5 mg/dL   GFR calc non Af Amer 9 (*) >90 mL/min   GFR calc Af Amer 10 (*) >90 mL/min   Comment: (NOTE)     The eGFR has been calculated using the CKD EPI equation.     This calculation has not been validated in all clinical situations.     eGFR's persistently <90 mL/min signify possible Chronic Kidney     Disease.  GLUCOSE, CAPILLARY     Status: None   Collection Time    08/06/13  3:05 PM      Result Value Ref Range   Glucose-Capillary 85  70 - 99 mg/dL  GLUCOSE, CAPILLARY     Status: None   Collection Time    08/06/13  7:12 PM      Result Value Ref Range   Glucose-Capillary 86  70 - 99 mg/dL  TROPONIN I     Status: Abnormal   Collection Time    08/06/13  7:35 PM      Result Value Ref Range   Troponin I 1.31 (*) <0.30 ng/mL   Comment:            Due to the release kinetics of cTnI,     a negative result within the first hours     of the onset of symptoms does not rule out     myocardial infarction with certainty.     If myocardial infarction is still suspected,     repeat the test at appropriate intervals.     CRITICAL VALUE NOTED.  VALUE IS CONSISTENT WITH PREVIOUSLY REPORTED AND CALLED VALUE.  HEPATITIS B SURFACE ANTIGEN     Status: None   Collection Time    08/06/13  8:00 PM      Result Value Ref Range   Hepatitis B Surface Ag NEGATIVE  NEGATIVE  Comment: Performed at [facility], CAPILLARY     Status: Abnormal   Collection Time    08/06/13 11:51 PM      Result Value Ref Range   Glucose-Capillary 66 (*) 70 - 99 mg/dL   Comment 1 Notify RN    GLUCOSE, CAPILLARY     Status: Abnormal   Collection Time    08/07/13 12:20 AM      Result Value Ref Range   Glucose-Capillary 69 (*) 70 - 99 mg/dL   Comment 1 Notify RN    BASIC METABOLIC PANEL     Status: Abnormal   Collection Time    08/07/13  1:44 AM      Result Value Ref  Range   Sodium 137  137 - 147 mEq/L   Potassium 3.2 (*) 3.7 - 5.3 mEq/L   Comment: DELTA CHECK NOTED   Chloride 95 (*) 96 - 112 mEq/L   CO2 27  19 - 32 mEq/L   Glucose, Bld 103 (*) 70 - 99 mg/dL   BUN 20  6 - 23 mg/dL   Comment: DELTA CHECK NOTED   Creatinine, Ser 3.39 (*) 0.50 - 1.35 mg/dL   Comment: DELTA CHECK NOTED   Calcium 8.2 (*) 8.4 - 10.5 mg/dL   GFR calc non Af Amer 17 (*) >90 mL/min   GFR calc Af Amer 20 (*) >90 mL/min   Comment: (NOTE)     The eGFR has been calculated using the CKD EPI equation.     This calculation has not been validated in all clinical situations.     eGFR's persistently <90 mL/min signify possible Chronic Kidney     Disease.  CBC     Status: Abnormal   Collection Time    08/07/13  1:44 AM      Result Value Ref Range   WBC 10.3  4.0 - 10.5 K/uL   RBC 3.74 (*) 4.22 - 5.81 MIL/uL   Hemoglobin 11.6 (*) 13.0 - 17.0 g/dL   HCT 36.5 (*) 39.0 - 52.0 %   MCV 97.6  78.0 - 100.0 fL   MCH 31.0  26.0 - 34.0 pg   MCHC 31.8  30.0 - 36.0 g/dL   RDW 19.2 (*) 11.5 - 15.5 %   Platelets 111 (*) 150 - 400 K/uL   Comment: CONSISTENT WITH PREVIOUS RESULT  LACTIC ACID, PLASMA     Status: None   Collection Time    08/07/13  1:44 AM      Result Value Ref Range   Lactic Acid, Venous 2.0  0.5 - 2.2 mmol/L  TROPONIN I     Status: Abnormal   Collection Time    08/07/13  1:44 AM      Result Value Ref Range   Troponin I 1.19 (*) <0.30 ng/mL   Comment:            Due to the release kinetics of cTnI,     a negative result within the first hours     of the onset of symptoms does not rule out     myocardial infarction with certainty.     If myocardial infarction is still suspected,     repeat the test at appropriate intervals.     CRITICAL VALUE NOTED.  VALUE IS CONSISTENT WITH PREVIOUSLY REPORTED AND CALLED VALUE.  GLUCOSE, CAPILLARY     Status: None   Collection Time    08/07/13  1:59 AM      Result Value  Ref Range   Glucose-Capillary 96  70 - 99 mg/dL   Comment  1 Notify RN    GLUCOSE, CAPILLARY     Status: Abnormal   Collection Time    08/07/13  3:38 AM      Result Value Ref Range   Glucose-Capillary 104 (*) 70 - 99 mg/dL    Imaging / Studies: Ct Abdomen Pelvis Wo Contrast  08/06/2013   CLINICAL DATA:  Abdominal pain and fever. Recent surgical procedure for peritoneal dialysis 5 days ago.  EXAM: CT ABDOMEN AND PELVIS WITHOUT CONTRAST  TECHNIQUE: Multidetector CT imaging of the abdomen and pelvis was performed following the standard protocol without intravenous contrast.  COMPARISON:  None.  FINDINGS: Lung bases demonstrate a moderate size right pleural effusion and small left pleural effusion with associated airspace opacification which may be due to atelectasis or infection. There is mild cardiomegaly.  Abdominal images demonstrate evidence of a prior cholecystectomy. There is a small amount of perihepatic fluid. There is mild fatty infiltration of the liver with subtle nodular contour. The pancreas and adrenal glands are unremarkable. Appendix is within normal. Kidneys are normal in size with bilateral renal cortical thinning. There is a 6 cm exophytic simple cyst over the mid pole of the left kidney. There are a few small of bilateral hypodensities likely cysts. There is a 1.1 cm homogeneously hyperdense mass over the lower pole of the left kidney with Hounsfield unit measurements 45 likely a hemorrhagic cyst. Ureters are within normal. There is no hydronephrosis or nephrolithiasis. There is diverticulosis of the colon most prominent over the sigmoid colon. There is a peritoneal dialysis catheter entering the left mid abdomen with tip coiled over the pelvis between loops of rectosigmoid colon. There is moderate calcified plaque of the thoracoabdominal aorta and iliac arteries. Minimal free fluid over the right pericolic gutter. No evidence of free peritoneal air.  Remaining pelvic structures are notable only for moderate fecal retention over the rectosigmoid  colon. There is mild degenerative change of the spine and hips.  IMPRESSION: Moderate size right pleural effusion and small left pleural effusion with associated airspace opacification in the lower lobes which may be due to pneumonia or atelectasis.  Peritoneal dialysis catheter with tip coiled over the pelvis between several recto sigmoid colonic loops.  Changes involving the liver which may be due to cirrhosis. Small amount of ascites.  Bilateral renal cortical thinning and bilateral renal cysts. 1.1 cm homogeneously hyperdense left renal mass likely a hemorrhagic cyst. Recommend followup CT 6 months.  Diverticulosis of the colon. Moderate fecal retention over the rectosigmoid colon.  Small hiatal hernia.   Electronically Signed   By: Marin Olp M.D.   On: 08/06/2013 07:57   Dg Chest Port 1 View  08/07/2013   CLINICAL DATA:  Shortness of breath, pneumonia  EXAM: PORTABLE CHEST - 1 VIEW  COMPARISON:  DG CHEST 1V PORT dated 08/06/2013; DG CHEST 1V PORT dated 08/02/2013; DG CHEST 2 VIEW dated 04/25/2013; DG CHEST 1V PORT dated 02/16/2013  FINDINGS: Grossly unchanged enlarged cardiac silhouette and mediastinal contours with atherosclerotic plaque within the thoracic aorta. The pulmonary vasculature is indistinct with cephalization of flow. Interval increase in now small layering bilateral effusions with associated worsening bibasilar opacities, left greater than right. No definite evidence of edema. Grossly unchanged bones including presumed resection of the distal end of the right clavicle.  IMPRESSION: Worsening pulmonary edema, small layering bilateral effusions and associated bibasilar opacities, left greater than right, atelectasis versus infiltrate.  Electronically Signed   By: Sandi Mariscal M.D.   On: 08/07/2013 07:36   Dg Chest Port 1 View  08/06/2013   CLINICAL DATA:  68 year old male with shortness of breath, fever abdominal pain cyanosis. Initial encounter.  EXAM: PORTABLE CHEST - 1 VIEW  COMPARISON:   08/02/2013 and earlier.  FINDINGS: Portable AP semi upright view at 0612 hrs.  Extubated. Decreased bilateral veiling pulmonary opacity. Improved retrocardiac ventilation. Stable cardiac size and mediastinal contours. No pneumothorax. Stable pulmonary vascularity/interstitial opacity.  IMPRESSION: 1. Extubated. 2. Decreased appearance of bilateral pleural effusions and improved bibasilar ventilation. 3. Continued  pulmonary vascular congestion/interstitial edema.   Electronically Signed   By: Lars Pinks M.D.   On: 08/06/2013 06:31    Medications / Allergies: per chart  Antibiotics: Anti-infectives   Start     Dose/Rate Route Frequency Ordered Stop   08/07/13 1100  aztreonam (AZACTAM) 1 g in dextrose 5 % 50 mL IVPB     1 g 100 mL/hr over 30 Minutes Intravenous Every 24 hours 08/06/13 0955     08/06/13 2000  vancomycin (VANCOCIN) IVPB 750 mg/150 ml premix     750 mg 150 mL/hr over 60 Minutes Intravenous  Once 08/06/13 1718 08/06/13 2343   08/06/13 1400  metroNIDAZOLE (FLAGYL) IVPB 500 mg     500 mg 100 mL/hr over 60 Minutes Intravenous Every 8 hours 08/06/13 0955     08/06/13 1030  vancomycin (VANCOCIN) 1,500 mg in sodium chloride 0.9 % 500 mL IVPB     1,500 mg 250 mL/hr over 120 Minutes Intravenous NOW 08/06/13 0955 08/06/13 1257   08/06/13 1030  aztreonam (AZACTAM) 1 g in dextrose 5 % 50 mL IVPB     1 g 100 mL/hr over 30 Minutes Intravenous NOW 08/06/13 0955 08/06/13 1127   08/06/13 0630  levofloxacin (LEVAQUIN) IVPB 750 mg     750 mg 100 mL/hr over 90 Minutes Intravenous  Once 08/06/13 0618 08/06/13 0806   08/06/13 0630  metroNIDAZOLE (FLAGYL) IVPB 500 mg     500 mg 100 mL/hr over 60 Minutes Intravenous  Once 08/06/13 0618 08/06/13 0737       Note: This dictation was prepared with Dragon/digital dictation along with Smartphrase technology. Any transcriptional errors that result from this process are unintentional.

## 2013-08-07 NOTE — Telephone Encounter (Signed)
In follow-up with pt's wife's expressed concern re: patient's TSH value, returned call per Dr. Isidore Moos informing her level indicated thyroid function is normal.  She verbalized understanding.  She indicated further that Greco was admitted to [facility] d/t CAPD infection, expected to be DC's no sooner than Wednesday.  Gayleen Orem, RN, BSN, [facility] Head & Neck Oncology Navigator [phone]

## 2013-08-07 NOTE — Progress Notes (Signed)
  Echocardiogram 2D Echocardiogram has been performed.  Victor Lane FRANCES 08/07/2013, 3:35 PM

## 2013-08-08 DIAGNOSIS — J449 Chronic obstructive pulmonary disease, unspecified: Secondary | ICD-10-CM

## 2013-08-08 DIAGNOSIS — J09X2 Influenza due to identified novel influenza A virus with other respiratory manifestations: Secondary | ICD-10-CM

## 2013-08-08 DIAGNOSIS — N186 End stage renal disease: Secondary | ICD-10-CM

## 2013-08-08 DIAGNOSIS — R799 Abnormal finding of blood chemistry, unspecified: Secondary | ICD-10-CM

## 2013-08-08 DIAGNOSIS — I6529 Occlusion and stenosis of unspecified carotid artery: Secondary | ICD-10-CM

## 2013-08-08 LAB — GLUCOSE, CAPILLARY
GLUCOSE-CAPILLARY: 102 mg/dL — AB (ref 70–99)
GLUCOSE-CAPILLARY: 102 mg/dL — AB (ref 70–99)
GLUCOSE-CAPILLARY: 75 mg/dL (ref 70–99)
GLUCOSE-CAPILLARY: 83 mg/dL (ref 70–99)
GLUCOSE-CAPILLARY: 88 mg/dL (ref 70–99)
GLUCOSE-CAPILLARY: 91 mg/dL (ref 70–99)

## 2013-08-08 LAB — CBC
HCT: 36.5 % — ABNORMAL LOW (ref 39.0–52.0)
Hemoglobin: 11.6 g/dL — ABNORMAL LOW (ref 13.0–17.0)
MCH: 30.8 pg (ref 26.0–34.0)
MCHC: 31.8 g/dL (ref 30.0–36.0)
MCV: 96.8 fL (ref 78.0–100.0)
PLATELETS: 110 10*3/uL — AB (ref 150–400)
RBC: 3.77 MIL/uL — ABNORMAL LOW (ref 4.22–5.81)
RDW: 19.2 % — AB (ref 11.5–15.5)
WBC: 3.7 10*3/uL — ABNORMAL LOW (ref 4.0–10.5)

## 2013-08-08 LAB — RENAL FUNCTION PANEL
Albumin: 2.5 g/dL — ABNORMAL LOW (ref 3.5–5.2)
BUN: 25 mg/dL — ABNORMAL HIGH (ref 6–23)
CALCIUM: 8.6 mg/dL (ref 8.4–10.5)
CO2: 26 mEq/L (ref 19–32)
Chloride: 95 mEq/L — ABNORMAL LOW (ref 96–112)
Creatinine, Ser: 3.94 mg/dL — ABNORMAL HIGH (ref 0.50–1.35)
GFR, EST AFRICAN AMERICAN: 17 mL/min — AB (ref >90)
GFR, EST NON AFRICAN AMERICAN: 14 mL/min — AB (ref >90)
Glucose, Bld: 120 mg/dL — ABNORMAL HIGH (ref 70–99)
PHOSPHORUS: 3.3 mg/dL (ref 2.3–4.6)
Potassium: 4 mEq/L (ref 3.7–5.3)
SODIUM: 137 meq/L (ref 137–147)

## 2013-08-08 LAB — URINE CULTURE
Colony Count: NO GROWTH
Culture: NO GROWTH

## 2013-08-08 LAB — LEGIONELLA ANTIGEN, URINE: LEGIONELLA ANTIGEN, URINE: NEGATIVE

## 2013-08-08 MED ORDER — INSULIN ASPART 100 UNIT/ML ~~LOC~~ SOLN
0.0000 [IU] | Freq: Every day | SUBCUTANEOUS | Status: DC
Start: 2013-08-08 — End: 2013-08-15

## 2013-08-08 MED ORDER — INSULIN ASPART 100 UNIT/ML ~~LOC~~ SOLN
0.0000 [IU] | Freq: Three times a day (TID) | SUBCUTANEOUS | Status: DC
  Administered 2013-08-09: 2 [IU] via SUBCUTANEOUS
  Administered 2013-08-13 (×2): 1 [IU] via SUBCUTANEOUS

## 2013-08-08 MED ORDER — VANCOMYCIN HCL IN DEXTROSE 750-5 MG/150ML-% IV SOLN
750.0000 mg | Freq: Once | INTRAVENOUS | Status: AC
  Administered 2013-08-08: 750 mg via INTRAVENOUS
  Filled 2013-08-08: qty 150

## 2013-08-08 MED ORDER — OSELTAMIVIR PHOSPHATE 30 MG PO CAPS
30.0000 mg | ORAL_CAPSULE | Freq: Every day | ORAL | Status: AC
  Administered 2013-08-08 – 2013-08-12 (×5): 30 mg via ORAL
  Filled 2013-08-08 (×5): qty 1

## 2013-08-08 NOTE — Progress Notes (Signed)
Subjective:   Feeling better, afebrile. Abd pain much improved. Wants to go home  Objective Filed Vitals:   08/07/13 2123 08/08/13 0416 08/08/13 0855 08/08/13 0936  BP:  158/72 143/66   Pulse:  112 109   Temp:  98.2 F (36.8 C) 99.3 F (37.4 C)   TempSrc:  Oral    Resp:  18 18   Height:      Weight:      SpO2: 93% 92% 94% 95%   Physical Exam General:Alert, cooperative, NAD Heart:RRR, tachy, no murmur or rub Lungs: faint bibasilar crackles. Regular and unlabored. Abdomen:  Slightly distented, nontender, Abd binder in place. PD cath with dressing clean and dry, no drainage noted. Extremities: +2 LE pitting edema Dialysis Access: LUA AVF +bruit/thrill  Dialysis Orders: MWF @ [facility]  4 hrs 79 kg 400/A1.5 2K/2.25Ca Heparin 2400 U AVF @ LUA  Hectorol 5 mcg Epogen 0 Venofer 0  Assessment/Plan:  1. Abdominal pain/Peritonitis - Afebrile.  PD catheter placed 2/10, purulent drainage on admission but dressing currently clean and dry and ? PD catheter displacement; IV Aztreonam, Flagyl & Vancomycin started. Surgery has seen. External tubing replaced sterilely. Plans replacement of PD cath in OR if peritonitis does not resolve. 2. Hyperkalemia - resolved with HD 3. ESRD - MWF, HD today and tomorrow to keep on schedule 4. HyPOtension/volume - SBPs 110s s/p IVF. No pressors. Home Metoprolol 12.5 mg bid on hold; Reached EDW at last outpatient HD but still with vol xs on exam. Trying for UF 2L today. Needs lower edw. 5. Anemia - Hgb 11.6, last T-sat 38% with ferritin 1936; no outpatient Epogen or Fe. 6. Metabolic bone disease - Ca 8.2(~8.8 corrected), last P 5.3, iPTH 180; Hectorol 5 mcg, Hold Renvela 3 pending resolution of #1  7. Nutrition - Alb 3.2, Renal/carb mod diet, nepro. multivitamin. 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 9. 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     Shelle Iron, NP [facility] Beeper [phone] 08/08/2013,10:22 AM  LOS: 2 days    Additional Objective Labs: Basic Metabolic Panel:  Recent Labs Lab 08/02/13 0220 08/06/13 0555 08/06/13 1219 08/07/13 0144  NA 141 137 137 137  K 3.6* 6.4* 4.3 3.2*  CL 96 88* 95* 95*  CO2 25 16* 19 27  GLUCOSE 84 71 89 103*  BUN 48* 35* 41* 20  CREATININE 6.26* 5.76* 5.77*  5.92* 3.39*  CALCIUM 8.6 9.9 8.6 8.2*  PHOS 5.3*  --   --   --    Liver Function Tests:  Recent Labs Lab 08/06/13 0555  AST 81*  ALT 31  ALKPHOS 128*  BILITOT 1.1  PROT 7.3  ALBUMIN 3.2*    Recent Labs Lab 08/06/13 1219  LIPASE 34  AMYLASE 1053*   CBC:  Recent Labs Lab 08/02/13 0220 08/06/13 0555 08/06/13 1219 08/07/13 0144  WBC 5.5 5.9 8.9 10.3  NEUTROABS  --  4.1  --   --   HGB 13.0 14.0 12.1* 11.6*  HCT 41.0 44.8 38.6* 36.5*  MCV 96.7 100.7* 97.7 97.6  PLT 147* 192 100* 111*   Blood Culture    Component Value Date/Time   SDES BLOOD RIGHT FOREARM 08/06/2013 0630   SPECREQUEST BOTTLES DRAWN AEROBIC AND ANAEROBIC 6CC 08/06/2013 0630   CULT  Value:        BLOOD CULTURE RECEIVED NO GROWTH TO DATE CULTURE WILL BE HELD FOR 5 DAYS BEFORE ISSUING A FINAL NEGATIVE REPORT Performed at [facility] 08/06/2013 0630   REPTSTATUS PENDING 08/06/2013 0630    Cardiac Enzymes:  Recent Labs Lab 08/06/13 1219 08/06/13 1935 08/07/13 0144 08/07/13 0815  TROPONINI 0.94* 1.31* 1.19* 0.89*   CBG:  Recent Labs Lab 08/07/13 1532 08/07/13 2002 08/07/13 2359 08/08/13 0407 08/08/13 0745  GLUCAP 116* 119* 88 75 91   Iron Studies: No results found for this basename: IRON, TIBC, TRANSFERRIN, FERRITIN,  in the last 72 hours @lablastinr3 @ Studies/Results: Dg Chest Port 1 View  08/07/2013   CLINICAL DATA:  Shortness of breath, pneumonia  EXAM: PORTABLE CHEST - 1 VIEW  COMPARISON:  DG CHEST 1V PORT dated 08/06/2013; DG CHEST 1V PORT dated 08/02/2013; DG CHEST 2 VIEW dated 04/25/2013; DG CHEST 1V PORT dated 02/16/2013  FINDINGS: Grossly unchanged enlarged cardiac silhouette  and mediastinal contours with atherosclerotic plaque within the thoracic aorta. The pulmonary vasculature is indistinct with cephalization of flow. Interval increase in now small layering bilateral effusions with associated worsening bibasilar opacities, left greater than right. No definite evidence of edema. Grossly unchanged bones including presumed resection of the distal end of the right clavicle.  IMPRESSION: Worsening pulmonary edema, small layering bilateral effusions and associated bibasilar opacities, left greater than right, atelectasis versus infiltrate.   Electronically Signed   By: Sandi Mariscal M.D.   On: 08/07/2013 07:36   Medications: . sodium chloride 10 mL/hr at 08/06/13 2200   . aztreonam  1 g Intravenous Q24H  . doxercalciferol  5 mcg Intravenous Q M,W,F-HD  . heparin  2,000 Units Dialysis Once in dialysis  . heparin  5,000 Units Subcutaneous 3 times per day  . insulin aspart  1-3 Units Subcutaneous 6 times per day  . ipratropium-albuterol  3 mL Nebulization TID  . lip balm  1 application Topical BID  . metronidazole  500 mg Intravenous Q8H  . multivitamin  1 tablet Oral QHS  . pantoprazole  40 mg Oral Daily  . polyethylene glycol  17 g Oral BID  . saccharomyces boulardii  250 mg Oral BID   2/17 Renal Some dyspnea and worsening CXR yesterday with clinical findings of volume overloa.  Dialysis today and in AM ACPowell, MD

## 2013-08-08 NOTE — Progress Notes (Signed)
Subjective: No Cp or SOB  Objective: Vital signs in last 24 hours: Temp:  [97.9 F (36.6 C)-99.3 F (37.4 C)] 98 F (36.7 C) (02/17 1221) Pulse Rate:  [94-112] 110 (02/17 1221) Resp:  [15-25] 18 (02/17 1221) BP: (121-158)/(53-76) 121/59 mmHg (02/17 1221) SpO2:  [91 %-98 %] 95 % (02/17 1309) Weight:  [172 lb 14.4 oz (78.427 kg)] 172 lb 14.4 oz (78.427 kg) (02/16 2006) Last BM Date: 08/07/13  Intake/Output from previous day: 02/16 0701 - 02/17 0700 In: 220 [I.V.:70; IV Piggyback:150] Out: 3460  Intake/Output this shift: Total I/O In: 280 [P.O.:280] Out: -   Medications Current Facility-Administered Medications  Medication Dose Route Frequency Provider Last Rate Last Dose  . 0.9 %  sodium chloride infusion  250 mL Intravenous PRN Tammy S Parrett, NP      . 0.9 %  sodium chloride infusion  100 mL Intravenous PRN Maree Krabbe, MD      . 0.9 %  sodium chloride infusion  100 mL Intravenous PRN Maree Krabbe, MD      . 0.9 %  sodium chloride infusion   Intravenous Continuous Jenelle Mages, MD 10 mL/hr at 08/06/13 2200    . acetaminophen (TYLENOL) suppository 650 mg  650 mg Rectal Q6H PRN Ardeth Sportsman, MD      . acetaminophen (TYLENOL) tablet 325-650 mg  325-650 mg Oral Q6H PRN Ardeth Sportsman, MD      . albuterol (PROVENTIL) (2.5 MG/3ML) 0.083% nebulizer solution 2.5 mg  2.5 mg Nebulization Q2H PRN Tammy S Parrett, NP      . aztreonam (AZACTAM) 1 g in dextrose 5 % 50 mL IVPB  1 g Intravenous Q24H Kendra P Hiatt, RPH   1 g at 08/08/13 1011  . bisacodyl (DULCOLAX) suppository 10 mg  10 mg Rectal Q12H PRN Ardeth Sportsman, MD      . doxercalciferol (HECTOROL) injection 5 mcg  5 mcg Intravenous Q M,W,F-HD Kerin Salen, PA-C   5 mcg at 08/07/13 1518  . feeding supplement (NEPRO CARB STEADY) liquid 237 mL  237 mL Oral PRN Maree Krabbe, MD      . heparin injection 1,000 Units  1,000 Units Dialysis PRN Maree Krabbe, MD      . heparin injection 2,000 Units  2,000  Units Dialysis Once in dialysis Maree Krabbe, MD      . heparin injection 5,000 Units  5,000 Units Subcutaneous 3 times per day Julio Sicks, NP   5,000 Units at 08/08/13 0443  . HYDROmorphone (DILAUDID) injection 0.5 mg  0.5 mg Intravenous Q4H PRN Jenelle Mages, MD   0.5 mg at 08/08/13 0442  . insulin aspart (novoLOG) injection 0-5 Units  0-5 Units Subcutaneous QHS Jessica U Vann, DO      . insulin aspart (novoLOG) injection 0-9 Units  0-9 Units Subcutaneous TID WC Jessica U Vann, DO      . ipratropium-albuterol (DUONEB) 0.5-2.5 (3) MG/3ML nebulizer solution 3 mL  3 mL Nebulization TID Maree Krabbe, MD   3 mL at 08/08/13 1309  . lactulose (CHRONULAC) 10 GM/15ML solution 20 g  20 g Oral BID PRN Ardeth Sportsman, MD      . lidocaine (PF) (XYLOCAINE) 1 % injection 5 mL  5 mL Intradermal PRN Maree Krabbe, MD      . lidocaine-prilocaine (EMLA) cream 1 application  1 application Topical PRN Maree Krabbe, MD      .  lip balm (BLISTEX) ointment 1 application  1 application Topical BID [name], MD   1 application at 67/12/45 1009  . magic mouthwash  15 mL Oral QID PRN [name], MD      . metroNIDAZOLE (FLAGYL) IVPB 500 mg  500 mg Intravenous Q8H Kendra P Hiatt, RPH   500 mg at 08/08/13 0651  . multivitamin (RENA-VIT) tablet 1 tablet  1 tablet Oral QHS Alvia Grove, PA-C   1 tablet at 08/07/13 2316  . oseltamivir (TAMIFLU) capsule 30 mg  30 mg Oral Daily Geradine Girt, DO   30 mg at 08/08/13 1227  . pantoprazole (PROTONIX) EC tablet 40 mg  40 mg Oral Daily Tammy S Parrett, NP   40 mg at 08/08/13 1007  . pentafluoroprop-tetrafluoroeth (GEBAUERS) aerosol 1 application  1 application Topical PRN Sol Blazing, MD      . polyethylene glycol (MIRALAX / GLYCOLAX) packet 17 g  17 g Oral BID [name], MD   17 g at 08/08/13 1007  . saccharomyces boulardii (FLORASTOR) capsule 250 mg  250 mg Oral BID [name], MD   250 mg at 08/08/13 1007    PE: General  appearance: alert, cooperative and no distress Lungs: clear to auscultation bilaterally Heart: regularly irregular rhythm Extremities: No LEE Pulses: 2+ and symmetric Skin: Warm and dry Neurologic: Grossly normal  Lab Results:   Recent Labs  08/06/13 0555 08/06/13 1219 08/07/13 0144  WBC 5.9 8.9 10.3  HGB 14.0 12.1* 11.6*  HCT 44.8 38.6* 36.5*  PLT 192 100* 111*   BMET  Recent Labs  08/06/13 0555 08/06/13 1219 08/07/13 0144  NA 137 137 137  K 6.4* 4.3 3.2*  CL 88* 95* 95*  CO2 16* 19 27  GLUCOSE 71 89 103*  BUN 35* 41* 20  CREATININE 5.76* 5.77*  5.92* 3.39*  CALCIUM 9.9 8.6 8.2*    Assessment/Plan   Principal Problem:   Severe sepsis(995.92) Active Problems:   Acute respiratory failure   Diabetes mellitus   HTN (hypertension)   ESRD (end stage renal disease) on dialysis   Anemia of chronic disease   Protein-calorie malnutrition, severe   Other malaise and fatigue   COPD GOLD II   Peritonitis   HCAP (healthcare-associated pneumonia)   CAPD (continuous ambulatory peritoneal dialysis) catheter in place   Elevated troponin level  Plan:  Troponin peak at 1.31 and trending down.  Likely demand ischemia from sepsis.  NSR to mild sinus tach on tele.  Very consistent.  Runs of PACs.  BP stable.  Echo EF 40-45% with normal wall motion, grade two diastolic. Dysfunction.  Echo in Oct 2014 was similar but did not indicate an EF %.     LOS: 2 days    HAGER, BRYAN 08/08/2013 2:15 PM  I have seen and examined the patient along with Tarri Fuller, PA.  I have reviewed the chart, notes and new data.  I agree with PA's note.  Key new complaints: no angina Key examination changes: no clinical HF, occasional ectopy Key new findings / data: peak Trop around 1; echo shows reduction in LVEF from normal to 40-45%, but the pattern is of global hypokinesis  PLAN: Overall impression remains that his very mild cardiac troponin elevation was a consequence of sepsis, rather  than true acute coronary event.  The reduction in LVEF may reflect cardiomyopathy of critical illness, but on the other hand, sepsis may have unmasked underlying chronic coronary insufficiency. He  has multiple risk factors including DM and has established PAD. Once sepsis is resolved, recommend a nuclear perfusion study for risk stratification. Right now, he is still too ill.  Sanda Klein, MD, [place] [phone] 08/08/2013, 3:38 PM

## 2013-08-08 NOTE — Progress Notes (Addendum)
[address] Lane [name] [id number] 03/25/1946  CARE TEAM:  PCP: [name], MD  Outpatient Care Team: Patient Care Team: [name], MD as PCP - General (Family Medicine) Sueanne Margarita, MD (Cardiology) Brooks Sailors, RN as Registered Nurse (Oncology) Tanda Rockers, MD as Consulting Physician (Pulmonary Disease) Lucrezia Starch, MD as Consulting Physician (Nephrology)  Inpatient Treatment Team: Treatment Team: Attending Provider: Geradine Girt, DO; Technician: Mindi Curling, NT; Consulting Physician: Sol Blazing, MD; Consulting Physician: [name], MD; Consulting Physician: Michae Kava Lbcardiology, MD; Registered Nurse: Lorenso Courier, RN; Nurse Practitioner: Marlena Clipper, NP; Dietitian: Dalene Carrow, RD; Rounding Team: Trenton Gammon, MD   Subjective:  Transferred to renal floor Feels much better Abd pain less Son in room "Do I have a virus?"  Objective:  Vital signs:  Filed Vitals:   08/07/13 2123 08/08/13 0416 08/08/13 0855 08/08/13 0936  BP:  158/72 143/66   Pulse:  112 109   Temp:  98.2 F (36.8 C) 99.3 F (37.4 C)   TempSrc:  Oral    Resp:  18 18   Height:      Weight:      SpO2: 93% 92% 94% 95%    Last BM Date: 08/07/13  Intake/Output   Yesterday:  02/16 0701 - 02/17 0700 In: 220 [I.V.:70; IV Piggyback:150] Out: 3460  This shift:  Total I/O In: 55 [P.O.:280] Out: -   Bowel function:  Flatus: y  BM: n  Drain: CAPD exit site clean & dry  Physical Exam:  General: Pt awake/alert/oriented x4 in no acute distress Eyes: PERRL, normal EOM.  Sclera clear.  No icterus Neuro: CN II-XII intact w/o focal sensory/motor deficits. Lymph: No head/neck/groin lymphadenopathy Psych:  No delerium/psychosis/paranoia HENT: Normocephalic, Mucus membranes moist.  No thrush Neck: Supple, No tracheal deviation Chest: No chest wall pain w good excursion CV:  Pulses intact.  Regular rhythm MS: Normal AROM mjr joints.  No  obvious deformity Abdomen: Soft.  Nondistended.  Minimal TTP - no peritonitis.  No incarcerated hernias. Ext:  SCDs BLE.  No mjr edema.  No cyanosis Skin: No petechiae / purpura   Problem List:   Principal Problem:   Severe sepsis(995.92) Active Problems:   Acute respiratory failure   Diabetes mellitus   HTN (hypertension)   ESRD (end stage renal disease) on dialysis   Anemia of chronic disease   Protein-calorie malnutrition, severe   Other malaise and fatigue   COPD GOLD II   Peritonitis   HCAP (healthcare-associated pneumonia)   CAPD (continuous ambulatory peritoneal dialysis) catheter in place   Elevated troponin level   Assessment  Victor Lane  68 y.o. male        Improved but still deconditioned w multiple medical problems  Plan:  -cont IV ABx -solid diet -PT/OT/Rehab.  Deconditioned -CAPD care - consider CAPD RN eval - defer to nephrology  -try to keep CAPD catheter in - if peritonitis not resolving or worsens, remove CAPD catheter & regroup   -will s/o for now.  Call w questions -VTE prophylaxis- SCDs, etc -mobilize as tolerated to help recovery  [name], M.D., F.A.C.S. Gastrointestinal and Minimally Invasive Surgery [facility] [address] [place], [place] [address] [phone] Main / Paging   08/08/2013   Results:   Labs: Results for orders placed during the hospital encounter of 08/06/13 (from the past 48 hour(s))  CBC     Status: Abnormal  Collection Time    08/06/13 12:19 PM      Result Value Ref Range   WBC 8.9  4.0 - 10.5 K/uL   RBC 3.95 (*) 4.22 - 5.81 MIL/uL   Hemoglobin 12.1 (*) 13.0 - 17.0 g/dL   HCT 38.6 (*) 39.0 - 52.0 %   MCV 97.7  78.0 - 100.0 fL   MCH 30.6  26.0 - 34.0 pg   MCHC 31.3  30.0 - 36.0 g/dL   RDW 19.0 (*) 11.5 - 15.5 %   Platelets 100 (*) 150 - 400 K/uL   Comment: PLATELET COUNT CONFIRMED BY SMEAR     LARGE PLATELETS PRESENT  CREATININE, SERUM     Status:  Abnormal   Collection Time    08/06/13 12:19 PM      Result Value Ref Range   Creatinine, Ser 5.77 (*) 0.50 - 1.35 mg/dL   GFR calc non Af Amer 9 (*) >90 mL/min   GFR calc Af Amer 11 (*) >90 mL/min   Comment: (NOTE)     The eGFR has been calculated using the CKD EPI equation.     This calculation has not been validated in all clinical situations.     eGFR's persistently <90 mL/min signify possible Chronic Kidney     Disease.  TROPONIN I     Status: Abnormal   Collection Time    08/06/13 12:19 PM      Result Value Ref Range   Troponin I 0.94 (*) <0.30 ng/mL   Comment:            Due to the release kinetics of cTnI,     a negative result within the first hours     of the onset of symptoms does not rule out     myocardial infarction with certainty.     If myocardial infarction is still suspected,     repeat the test at appropriate intervals.     CRITICAL RESULT CALLED TO, READ BACK BY AND VERIFIED WITH:     MACASERO,E RN 08/06/13 1309 [place]  AMYLASE     Status: Abnormal   Collection Time    08/06/13 12:19 PM      Result Value Ref Range   Amylase 1053 (*) 0 - 105 U/L  LIPASE, BLOOD     Status: None   Collection Time    08/06/13 12:19 PM      Result Value Ref Range   Lipase 34  11 - 59 U/L  PROCALCITONIN     Status: None   Collection Time    08/06/13 12:19 PM      Result Value Ref Range   Procalcitonin 34.60     Comment:            Interpretation:     PCT >= 10 ng/mL:     Important systemic inflammatory response,     almost exclusively due to severe bacterial     sepsis or septic shock.     (NOTE)             ICU PCT Algorithm               Non ICU PCT Algorithm        ----------------------------     ------------------------------             PCT < 0.25 ng/mL                 PCT < 0.1 ng/mL  Stopping of antibiotics            Stopping of antibiotics           strongly encouraged.               strongly encouraged.        ----------------------------      ------------------------------           PCT level decrease by               PCT < 0.25 ng/mL           >= 80% from peak PCT           OR PCT 0.25 - 0.5 ng/mL          Stopping of antibiotics                                                 encouraged.         Stopping of antibiotics               encouraged.        ----------------------------     ------------------------------           PCT level decrease by              PCT >= 0.25 ng/mL           < 80% from peak PCT            AND PCT >= 0.5 ng/mL            Continuing antibiotics                                                  encouraged.           Continuing antibiotics                encouraged.        ----------------------------     ------------------------------         PCT level increase compared          PCT > 0.5 ng/mL             with peak PCT AND              PCT >= 0.5 ng/mL             Escalation of antibiotics                                              strongly encouraged.          Escalation of antibiotics            strongly encouraged.  CORTISOL     Status: None   Collection Time    08/06/13 12:19 PM      Result Value Ref Range   Cortisol, Plasma 37.8     Comment: (NOTE)     AM:  4.3 - 22.4 ug/dL     PM:  3.1 - 16.7 ug/dL     Performed at [facility]  BASIC METABOLIC PANEL     Status: Abnormal   Collection Time    08/06/13 12:19 PM      Result Value Ref Range   Sodium 137  137 - 147 mEq/L   Potassium 4.3  3.7 - 5.3 mEq/L   Comment: DELTA CHECK NOTED   Chloride 95 (*) 96 - 112 mEq/L   CO2 19  19 - 32 mEq/L   Glucose, Bld 89  70 - 99 mg/dL   BUN 41 (*) 6 - 23 mg/dL   Creatinine, Ser 5.92 (*) 0.50 - 1.35 mg/dL   Calcium 8.6  8.4 - 10.5 mg/dL   GFR calc non Af Amer 9 (*) >90 mL/min   GFR calc Af Amer 10 (*) >90 mL/min   Comment: (NOTE)     The eGFR has been calculated using the CKD EPI equation.     This calculation has not been validated in all clinical situations.     eGFR's persistently  <90 mL/min signify possible Chronic Kidney     Disease.  GLUCOSE, CAPILLARY     Status: None   Collection Time    08/06/13  3:05 PM      Result Value Ref Range   Glucose-Capillary 85  70 - 99 mg/dL  GLUCOSE, CAPILLARY     Status: None   Collection Time    08/06/13  7:12 PM      Result Value Ref Range   Glucose-Capillary 86  70 - 99 mg/dL  TROPONIN I     Status: Abnormal   Collection Time    08/06/13  7:35 PM      Result Value Ref Range   Troponin I 1.31 (*) <0.30 ng/mL   Comment:            Due to the release kinetics of cTnI,     a negative result within the first hours     of the onset of symptoms does not rule out     myocardial infarction with certainty.     If myocardial infarction is still suspected,     repeat the test at appropriate intervals.     CRITICAL VALUE NOTED.  VALUE IS CONSISTENT WITH PREVIOUSLY REPORTED AND CALLED VALUE.  HEPATITIS B SURFACE ANTIGEN     Status: None   Collection Time    08/06/13  8:00 PM      Result Value Ref Range   Hepatitis B Surface Ag NEGATIVE  NEGATIVE   Comment: Performed at [place], CAPILLARY     Status: Abnormal   Collection Time    08/06/13 11:51 PM      Result Value Ref Range   Glucose-Capillary 66 (*) 70 - 99 mg/dL   Comment 1 Notify RN    GLUCOSE, CAPILLARY     Status: Abnormal   Collection Time    08/07/13 12:20 AM      Result Value Ref Range   Glucose-Capillary 69 (*) 70 - 99 mg/dL   Comment 1 Notify RN    BASIC METABOLIC PANEL     Status: Abnormal   Collection Time    08/07/13  1:44 AM      Result Value Ref Range   Sodium 137  137 - 147 mEq/L   Potassium 3.2 (*) 3.7 - 5.3 mEq/L   Comment: DELTA CHECK NOTED   Chloride 95 (*) 96 - 112 mEq/L   CO2 27  19 - 32 mEq/L   Glucose, Bld 103 (*) 70 -  99 mg/dL   BUN 20  6 - 23 mg/dL   Comment: DELTA CHECK NOTED   Creatinine, Ser 3.39 (*) 0.50 - 1.35 mg/dL   Comment: DELTA CHECK NOTED   Calcium 8.2 (*) 8.4 - 10.5 mg/dL   GFR calc non Af Amer 17 (*)  >90 mL/min   GFR calc Af Amer 20 (*) >90 mL/min   Comment: (NOTE)     The eGFR has been calculated using the CKD EPI equation.     This calculation has not been validated in all clinical situations.     eGFR's persistently <90 mL/min signify possible Chronic Kidney     Disease.  CBC     Status: Abnormal   Collection Time    08/07/13  1:44 AM      Result Value Ref Range   WBC 10.3  4.0 - 10.5 K/uL   RBC 3.74 (*) 4.22 - 5.81 MIL/uL   Hemoglobin 11.6 (*) 13.0 - 17.0 g/dL   HCT 36.5 (*) 39.0 - 52.0 %   MCV 97.6  78.0 - 100.0 fL   MCH 31.0  26.0 - 34.0 pg   MCHC 31.8  30.0 - 36.0 g/dL   RDW 19.2 (*) 11.5 - 15.5 %   Platelets 111 (*) 150 - 400 K/uL   Comment: CONSISTENT WITH PREVIOUS RESULT  LACTIC ACID, PLASMA     Status: None   Collection Time    08/07/13  1:44 AM      Result Value Ref Range   Lactic Acid, Venous 2.0  0.5 - 2.2 mmol/L  TROPONIN I     Status: Abnormal   Collection Time    08/07/13  1:44 AM      Result Value Ref Range   Troponin I 1.19 (*) <0.30 ng/mL   Comment:            Due to the release kinetics of cTnI,     a negative result within the first hours     of the onset of symptoms does not rule out     myocardial infarction with certainty.     If myocardial infarction is still suspected,     repeat the test at appropriate intervals.     CRITICAL VALUE NOTED.  VALUE IS CONSISTENT WITH PREVIOUSLY REPORTED AND CALLED VALUE.  GLUCOSE, CAPILLARY     Status: None   Collection Time    08/07/13  1:59 AM      Result Value Ref Range   Glucose-Capillary 96  70 - 99 mg/dL   Comment 1 Notify RN    GLUCOSE, CAPILLARY     Status: Abnormal   Collection Time    08/07/13  3:38 AM      Result Value Ref Range   Glucose-Capillary 104 (*) 70 - 99 mg/dL  GLUCOSE, CAPILLARY     Status: None   Collection Time    08/07/13  7:52 AM      Result Value Ref Range   Glucose-Capillary 89  70 - 99 mg/dL  TROPONIN I     Status: Abnormal   Collection Time    08/07/13  8:15 AM       Result Value Ref Range   Troponin I 0.89 (*) <0.30 ng/mL   Comment:            Due to the release kinetics of cTnI,     a negative result within the first hours     of the onset of symptoms does not  rule out     myocardial infarction with certainty.     If myocardial infarction is still suspected,     repeat the test at appropriate intervals.     CRITICAL VALUE NOTED.  VALUE IS CONSISTENT WITH PREVIOUSLY REPORTED AND CALLED VALUE.  GLUCOSE, CAPILLARY     Status: Abnormal   Collection Time    08/07/13 12:11 PM      Result Value Ref Range   Glucose-Capillary 107 (*) 70 - 99 mg/dL  GLUCOSE, CAPILLARY     Status: Abnormal   Collection Time    08/07/13  3:32 PM      Result Value Ref Range   Glucose-Capillary 116 (*) 70 - 99 mg/dL  URINALYSIS, ROUTINE W REFLEX MICROSCOPIC     Status: Abnormal   Collection Time    08/07/13  6:55 PM      Result Value Ref Range   Color, Urine YELLOW  YELLOW   APPearance CLOUDY (*) CLEAR   Specific Gravity, Urine 1.020  1.005 - 1.030   pH 7.5  5.0 - 8.0   Glucose, UA 100 (*) NEGATIVE mg/dL   Hgb urine dipstick MODERATE (*) NEGATIVE   Bilirubin Urine SMALL (*) NEGATIVE   Ketones, ur NEGATIVE  NEGATIVE mg/dL   Protein, ur >300 (*) NEGATIVE mg/dL   Urobilinogen, UA 1.0  0.0 - 1.0 mg/dL   Nitrite NEGATIVE  NEGATIVE   Leukocytes, UA TRACE (*) NEGATIVE  STREP PNEUMONIAE URINARY ANTIGEN     Status: None   Collection Time    08/07/13  6:55 PM      Result Value Ref Range   Strep Pneumo Urinary Antigen NEGATIVE  NEGATIVE   Comment:            Infection due to S. pneumoniae     cannot be absolutely ruled out     since the antigen present     may be below the detection limit     of the test.  URINE MICROSCOPIC-ADD ON     Status: Abnormal   Collection Time    08/07/13  6:55 PM      Result Value Ref Range   Squamous Epithelial / LPF RARE  RARE   WBC, UA 3-6  <3 WBC/hpf   Bacteria, UA FEW (*) RARE   Casts GRANULAR CAST (*) NEGATIVE   Comment: HYALINE  CASTS   Urine-Other SPERM PRESENT    GLUCOSE, CAPILLARY     Status: Abnormal   Collection Time    08/07/13  8:02 PM      Result Value Ref Range   Glucose-Capillary 119 (*) 70 - 99 mg/dL  GLUCOSE, CAPILLARY     Status: None   Collection Time    08/07/13 11:59 PM      Result Value Ref Range   Glucose-Capillary 88  70 - 99 mg/dL  GLUCOSE, CAPILLARY     Status: None   Collection Time    08/08/13  4:07 AM      Result Value Ref Range   Glucose-Capillary 75  70 - 99 mg/dL  GLUCOSE, CAPILLARY     Status: None   Collection Time    08/08/13  7:45 AM      Result Value Ref Range   Glucose-Capillary 91  70 - 99 mg/dL    Imaging / Studies: Dg Chest Port 1 View  08/07/2013   CLINICAL DATA:  Shortness of breath, pneumonia  EXAM: PORTABLE CHEST - 1 VIEW  COMPARISON:  DG CHEST 1V  PORT dated 08/06/2013; DG CHEST 1V PORT dated 08/02/2013; DG CHEST 2 VIEW dated 04/25/2013; DG CHEST 1V PORT dated 02/16/2013  FINDINGS: Grossly unchanged enlarged cardiac silhouette and mediastinal contours with atherosclerotic plaque within the thoracic aorta. The pulmonary vasculature is indistinct with cephalization of flow. Interval increase in now small layering bilateral effusions with associated worsening bibasilar opacities, left greater than right. No definite evidence of edema. Grossly unchanged bones including presumed resection of the distal end of the right clavicle.  IMPRESSION: Worsening pulmonary edema, small layering bilateral effusions and associated bibasilar opacities, left greater than right, atelectasis versus infiltrate.   Electronically Signed   By: Sandi Mariscal M.D.   On: 08/07/2013 07:36    Medications / Allergies: per chart  Antibiotics: Anti-infectives   Start     Dose/Rate Route Frequency Ordered Stop   08/08/13 1115  vancomycin (VANCOCIN) IVPB 750 mg/150 ml premix     750 mg 150 mL/hr over 60 Minutes Intravenous  Once 08/08/13 1114     08/07/13 1600  vancomycin (VANCOCIN) IVPB 750 mg/150 ml  premix     750 mg 150 mL/hr over 60 Minutes Intravenous  Once 08/07/13 1346 08/07/13 1636   08/07/13 1100  aztreonam (AZACTAM) 1 g in dextrose 5 % 50 mL IVPB     1 g 100 mL/hr over 30 Minutes Intravenous Every 24 hours 08/06/13 0955     08/06/13 2000  vancomycin (VANCOCIN) IVPB 750 mg/150 ml premix     750 mg 150 mL/hr over 60 Minutes Intravenous  Once 08/06/13 1718 08/06/13 2343   08/06/13 1400  metroNIDAZOLE (FLAGYL) IVPB 500 mg     500 mg 100 mL/hr over 60 Minutes Intravenous Every 8 hours 08/06/13 0955     08/06/13 1030  vancomycin (VANCOCIN) 1,500 mg in sodium chloride 0.9 % 500 mL IVPB     1,500 mg 250 mL/hr over 120 Minutes Intravenous NOW 08/06/13 0955 08/06/13 1257   08/06/13 1030  aztreonam (AZACTAM) 1 g in dextrose 5 % 50 mL IVPB     1 g 100 mL/hr over 30 Minutes Intravenous NOW 08/06/13 0955 08/06/13 1127   08/06/13 0630  levofloxacin (LEVAQUIN) IVPB 750 mg     750 mg 100 mL/hr over 90 Minutes Intravenous  Once 08/06/13 0618 08/06/13 0806   08/06/13 0630  metroNIDAZOLE (FLAGYL) IVPB 500 mg     500 mg 100 mL/hr over 60 Minutes Intravenous  Once 08/06/13 0618 08/06/13 0737       Note: This dictation was prepared with Dragon/digital dictation along with Smartphrase technology. Any transcriptional errors that result from this process are unintentional.

## 2013-08-08 NOTE — Progress Notes (Signed)
[facility] PROGRESS NOTE  DARIC KOREN HKV:[id number] DOB: 04-09-1946 DOA: 08/06/2013 PCP: [name], MD  68 year old with extensive PMH including severe cardiovascular disease, ESRD and vocal cord cancer , critical illness with resp failure 01/2013 w/ trach presenting to the ER 2/15 for abdominal pain, fever, weakness with suspected Sepsis . PCCM called for admission . Discharged from hospital 2/11 after Peritoneal Dialysis cath placed.  Assessment/Plan:  Acute on chronic respiratory failure  Bilateral pleural effusions  COPD on nocturnal O2 w/out exacerbation  2/16 pulm edema on x ray Goal SpO2>92  Supplemental oxygen PRN  Albuterol / Ipratropium PRN  HD  Severe sepsis improved   NSTEMI: Most likely from demand ischemia  Goal MAP > 65  Lactate (12.77 max) and trop (1.31 max) trending down today  Hold ASA, Lopressor  Echo: EF 56% with diasolic dfxn Cardiology consulted   ESRD on HD - MWF  PD cath  HD per renal  Trend BMP   Suspected peritonitis  S/p PD cath placement 2/10: Flushing well, healing well externally (? Extension tubing pulled off at home)  Peritonitis   IV intraperitoneal ABX, if fails to improve condition will need to remove PD cath in OR - has improved thus far  Consult Surgery   Flu A -add tamiflu  DM  Hypoglycemia  SSI  CBG 69-104; D50 IV as needed for hypoglycemia  Hold Glipizide   Pain (abd) Morphine PRN  Dilaudid PRN for pain   Code Status: full Family Communication: patient Disposition Plan:    Consultants:  Renal   PCCM  Procedures: 2/10 Peritoneal Dialysis Cath placed  2/15 CT abd/pelvis >>> Moderate size right pleural effusion and small left pleural effusion ,bibasilar airspace disease, small ascites, PD cath ,no acute abd findings, ?cirrhosis  2/15: Hep B neg  2/15: procalcitonin 35.6   Antibiotics: Levaquin x 1  Aztreonam 2/15 >>>  Flagyl 2/15 >>>  Vancomycin 2/15 >>>    HPI/Subjective: feeling  better today No SOB, no CP  Objective: Filed Vitals:   08/08/13 0855  BP: 143/66  Pulse: 109  Temp: 99.3 F (37.4 C)  Resp: 18    Intake/Output Summary (Last 24 hours) at 08/08/13 1133 Last data filed at 08/08/13 0856  Gross per 24 hour  Intake    460 ml  Output   3460 ml  Net  -3000 ml   Filed Weights   08/07/13 0207 08/07/13 1129 08/07/13 2006  Weight: 80.9 kg (178 lb 5.6 oz) 80.1 kg (176 lb 9.4 oz) 78.427 kg (172 lb 14.4 oz)    Exam:  General: Chronically ill appearing male  Neuro: A/o x 3  Cardiovascular: ST , no m/r/g  Lungs: Diminshed BS in bases , coarse rhonchi  Abdomen: gen tenderness, hypoactive BS , PD cath w/ dsg in tact and dry, drain / catheter tip protruding form the wound  Musculoskeletal: +2 edema, no erythema or tenderness LE.  Skin: Intact    Data Reviewed: Basic Metabolic Panel:  Recent Labs Lab 08/02/13 0220 08/06/13 0555 08/06/13 1219 08/07/13 0144  NA 141 137 137 137  K 3.6* 6.4* 4.3 3.2*  CL 96 88* 95* 95*  CO2 25 16* 19 27  GLUCOSE 84 71 89 103*  BUN 48* 35* 41* 20  CREATININE 6.26* 5.76* 5.77*  5.92* 3.39*  CALCIUM 8.6 9.9 8.6 8.2*  MG 2.1  --   --   --   PHOS 5.3*  --   --   --  Liver Function Tests:  Recent Labs Lab 08/06/13 0555  AST 81*  ALT 31  ALKPHOS 128*  BILITOT 1.1  PROT 7.3  ALBUMIN 3.2*    Recent Labs Lab 08/06/13 1219  LIPASE 34  AMYLASE 1053*   No results found for this basename: AMMONIA,  in the last 168 hours CBC:  Recent Labs Lab 08/02/13 0220 08/06/13 0555 08/06/13 1219 08/07/13 0144  WBC 5.5 5.9 8.9 10.3  NEUTROABS  --  4.1  --   --   HGB 13.0 14.0 12.1* 11.6*  HCT 41.0 44.8 38.6* 36.5*  MCV 96.7 100.7* 97.7 97.6  PLT 147* 192 100* 111*   Cardiac Enzymes:  Recent Labs Lab 08/06/13 1219 08/06/13 1935 08/07/13 0144 08/07/13 0815  TROPONINI 0.94* 1.31* 1.19* 0.89*   BNP (last 3 results) No results found for this basename: PROBNP,  in the last 8760 hours CBG:  Recent  Labs Lab 08/07/13 1532 08/07/13 2002 08/07/13 2359 08/08/13 0407 08/08/13 0745  GLUCAP 116* 119* 88 75 91    Recent Results (from the past 240 hour(s))  MRSA PCR SCREENING     Status: None   Collection Time    08/01/13  5:41 PM      Result Value Ref Range Status   MRSA by PCR NEGATIVE  NEGATIVE Final   Comment:            The GeneXpert MRSA Assay (FDA     approved for NASAL specimens     only), is one component of a     comprehensive MRSA colonization     surveillance program. It is not     intended to diagnose MRSA     infection nor to guide or     monitor treatment for     MRSA infections.  CULTURE, BLOOD (ROUTINE X 2)     Status: None   Collection Time    08/06/13  5:55 AM      Result Value Ref Range Status   Specimen Description BLOOD FOREARM RIGHT   Final   Special Requests BOTTLES DRAWN AEROBIC AND ANAEROBIC 6ML   Final   Culture  Setup Time     Final   Value: 08/06/2013 13:29     Performed at [facility]   Culture     Final   Value:        BLOOD CULTURE RECEIVED NO GROWTH TO DATE CULTURE WILL BE HELD FOR 5 DAYS BEFORE ISSUING A FINAL NEGATIVE REPORT     Performed at [facility]   Report Status PENDING   Incomplete  CULTURE, BLOOD (ROUTINE X 2)     Status: None   Collection Time    08/06/13  6:30 AM      Result Value Ref Range Status   Specimen Description BLOOD RIGHT FOREARM   Final   Special Requests BOTTLES DRAWN AEROBIC AND ANAEROBIC 6CC   Final   Culture  Setup Time     Final   Value: 08/06/2013 17:59     Performed at [facility]   Culture     Final   Value:        BLOOD CULTURE RECEIVED NO GROWTH TO DATE CULTURE WILL BE HELD FOR 5 DAYS BEFORE ISSUING A FINAL NEGATIVE REPORT     Performed at [facility]   Report Status PENDING   Incomplete  MRSA PCR SCREENING     Status: None   Collection Time    08/06/13 10:42 AM  Result Value Ref Range Status   MRSA by PCR NEGATIVE  NEGATIVE Final   Comment:            The  GeneXpert MRSA Assay (FDA     approved for NASAL specimens     only), is one component of a     comprehensive MRSA colonization     surveillance program. It is not     intended to diagnose MRSA     infection nor to guide or     monitor treatment for     MRSA infections.  RESPIRATORY VIRUS PANEL     Status: Abnormal   Collection Time    08/06/13 11:20 AM      Result Value Ref Range Status   Source - RVPAN NASOPHARYNGEAL   Final   Respiratory Syncytial Virus A NOT DETECTED   Final   Respiratory Syncytial Virus B NOT DETECTED   Final   Influenza A DETECTED (*)  Final   Influenza B NOT DETECTED   Final   Parainfluenza 1 NOT DETECTED   Final   Parainfluenza 2 NOT DETECTED   Final   Parainfluenza 3 NOT DETECTED   Final   Metapneumovirus NOT DETECTED   Final   Rhinovirus NOT DETECTED   Final   Adenovirus NOT DETECTED   Final   Influenza A H1 NOT DETECTED   Final   Influenza A H3 DETECTED (*)  Final   Comment: (NOTE)           Normal Reference Range for each Analyte: NOT DETECTED     Testing performed using the Luminex xTAG Respiratory Viral Panel test     kit.     This test was developed and its performance characteristics determined     by [facility]. It has not been cleared or approved by the [place]     Food and Drug Administration. This test is used for clinical purposes.     It should not be regarded as investigational or for research. This     laboratory is certified under the Clinical Laboratory Improvement     Amendments of 1988 (CLIA) as qualified to perform high complexity     clinical laboratory testing.     Performed at [facility]     Studies: Dg Chest Port 1 View  08/07/2013   CLINICAL DATA:  Shortness of breath, pneumonia  EXAM: PORTABLE CHEST - 1 VIEW  COMPARISON:  DG CHEST 1V PORT dated 08/06/2013; DG CHEST 1V PORT dated 08/02/2013; DG CHEST 2 VIEW dated 04/25/2013; DG CHEST 1V PORT dated 02/16/2013  FINDINGS: Grossly unchanged enlarged cardiac silhouette  and mediastinal contours with atherosclerotic plaque within the thoracic aorta. The pulmonary vasculature is indistinct with cephalization of flow. Interval increase in now small layering bilateral effusions with associated worsening bibasilar opacities, left greater than right. No definite evidence of edema. Grossly unchanged bones including presumed resection of the distal end of the right clavicle.  IMPRESSION: Worsening pulmonary edema, small layering bilateral effusions and associated bibasilar opacities, left greater than right, atelectasis versus infiltrate.   Electronically Signed   By: Simonne Come M.D.   On: 08/07/2013 07:36    Scheduled Meds: . aztreonam  1 g Intravenous Q24H  . doxercalciferol  5 mcg Intravenous Q M,W,F-HD  . heparin  2,000 Units Dialysis Once in dialysis  . heparin  5,000 Units Subcutaneous 3 times per day  . insulin aspart  1-3 Units Subcutaneous 6 times per day  .  ipratropium-albuterol  3 mL Nebulization TID  . lip balm  1 application Topical BID  . metronidazole  500 mg Intravenous Q8H  . multivitamin  1 tablet Oral QHS  . oseltamivir  30 mg Oral BID  . pantoprazole  40 mg Oral Daily  . polyethylene glycol  17 g Oral BID  . saccharomyces boulardii  250 mg Oral BID  . vancomycin  750 mg Intravenous Once   Continuous Infusions: . sodium chloride 10 mL/hr at 08/06/13 2200    Principal Problem:   Severe sepsis(995.92) Active Problems:   Acute respiratory failure   Diabetes mellitus   HTN (hypertension)   ESRD (end stage renal disease) on dialysis   Anemia of chronic disease   Protein-calorie malnutrition, severe   Other malaise and fatigue   COPD GOLD II   Peritonitis   HCAP (healthcare-associated pneumonia)   CAPD (continuous ambulatory peritoneal dialysis) catheter in place   Elevated troponin level    Time spent: 35 min    [address]  [facility] Pager [phone]. If 7PM-7AM, please contact night-coverage at [email or web address], password  [facility] 08/08/2013, 11:33 AM  LOS: 2 days

## 2013-08-08 NOTE — Progress Notes (Signed)
Pt given breathing tx and increased to 4L [place]. RN aware.

## 2013-08-09 ENCOUNTER — Encounter: Payer: Self-pay | Admitting: *Deleted

## 2013-08-09 DIAGNOSIS — M79609 Pain in unspecified limb: Secondary | ICD-10-CM

## 2013-08-09 DIAGNOSIS — I2589 Other forms of chronic ischemic heart disease: Secondary | ICD-10-CM

## 2013-08-09 LAB — CBC
HCT: 35.2 % — ABNORMAL LOW (ref 39.0–52.0)
HEMOGLOBIN: 11.1 g/dL — AB (ref 13.0–17.0)
MCH: 30.4 pg (ref 26.0–34.0)
MCHC: 31.5 g/dL (ref 30.0–36.0)
MCV: 96.4 fL (ref 78.0–100.0)
PLATELETS: 95 10*3/uL — AB (ref 150–400)
RBC: 3.65 MIL/uL — AB (ref 4.22–5.81)
RDW: 19 % — ABNORMAL HIGH (ref 11.5–15.5)
WBC: 2.4 10*3/uL — ABNORMAL LOW (ref 4.0–10.5)

## 2013-08-09 LAB — GLUCOSE, CAPILLARY
Glucose-Capillary: 65 mg/dL — ABNORMAL LOW (ref 70–99)
Glucose-Capillary: 172 mg/dL — ABNORMAL HIGH (ref 70–99)
Glucose-Capillary: 111 mg/dL — ABNORMAL HIGH (ref 70–99)

## 2013-08-09 LAB — RENAL FUNCTION PANEL
ALBUMIN: 2.4 g/dL — AB (ref 3.5–5.2)
BUN: 20 mg/dL (ref 6–23)
CHLORIDE: 94 meq/L — AB (ref 96–112)
CO2: 26 mEq/L (ref 19–32)
CREATININE: 3.55 mg/dL — AB (ref 0.50–1.35)
Calcium: 8.4 mg/dL (ref 8.4–10.5)
GFR calc Af Amer: 19 mL/min — ABNORMAL LOW (ref >90)
GFR calc non Af Amer: 16 mL/min — ABNORMAL LOW (ref >90)
Glucose, Bld: 98 mg/dL (ref 70–99)
Phosphorus: 3.8 mg/dL (ref 2.3–4.6)
Potassium: 5.9 mEq/L — ABNORMAL HIGH (ref 3.7–5.3)
Sodium: 132 mEq/L — ABNORMAL LOW (ref 137–147)

## 2013-08-09 MED ORDER — DEXTROSE 5 % IV SOLN
2.0000 g | Freq: Once | INTRAVENOUS | Status: AC
  Administered 2013-08-09: 2 g via INTRAVENOUS
  Filled 2013-08-09: qty 2

## 2013-08-09 MED ORDER — IPRATROPIUM-ALBUTEROL 0.5-2.5 (3) MG/3ML IN SOLN: 3.0000 mL | Freq: Four times a day (QID) | RESPIRATORY_TRACT | Status: DC | PRN

## 2013-08-09 MED ORDER — REGADENOSON 0.4 MG/5ML IV SOLN
0.4000 mg | Freq: Once | INTRAVENOUS | Status: DC
  Filled 2013-08-09: qty 5

## 2013-08-09 MED ORDER — METOPROLOL TARTRATE 12.5 MG HALF TABLET
12.5000 mg | ORAL_TABLET | Freq: Two times a day (BID) | ORAL | Status: DC
  Administered 2013-08-09 – 2013-08-15 (×11): 12.5 mg via ORAL
  Filled 2013-08-09 (×14): qty 1

## 2013-08-09 MED ORDER — PENTAFLUOROPROP-TETRAFLUOROETH EX AERO: 1.0000 "application " | INHALATION_SPRAY | CUTANEOUS | Status: DC | PRN

## 2013-08-09 MED ORDER — HYDROXYZINE HCL 25 MG PO TABS
25.0000 mg | ORAL_TABLET | Freq: Three times a day (TID) | ORAL | Status: DC | PRN
  Administered 2013-08-14: 25 mg via ORAL
  Filled 2013-08-09 (×2): qty 1

## 2013-08-09 MED ORDER — DEXTROSE 5 % IV SOLN
2.0000 g | INTRAVENOUS | Status: DC
  Administered 2013-08-11 – 2013-08-14 (×2): 2 g via INTRAVENOUS
  Filled 2013-08-09 (×3): qty 2

## 2013-08-09 MED ORDER — DILTIAZEM HCL 100 MG IV SOLR
5.0000 mg/h | INTRAVENOUS | Status: DC
  Administered 2013-08-09: 5 mg/h via INTRAVENOUS
  Administered 2013-08-10: 15 mg/h via INTRAVENOUS
  Administered 2013-08-10 – 2013-08-11 (×2): 10 mg/h via INTRAVENOUS
  Filled 2013-08-09 (×5): qty 100

## 2013-08-09 MED ORDER — NEPRO/CARBSTEADY PO LIQD
237.0000 mL | ORAL | Status: DC | PRN
  Filled 2013-08-09: qty 237

## 2013-08-09 MED ORDER — ZOLPIDEM TARTRATE 5 MG PO TABS
5.0000 mg | ORAL_TABLET | Freq: Every evening | ORAL | Status: DC | PRN
  Administered 2013-08-09 – 2013-08-10 (×2): 5 mg via ORAL
  Filled 2013-08-09 (×2): qty 1

## 2013-08-09 MED ORDER — HEPARIN SODIUM (PORCINE) 1000 UNIT/ML DIALYSIS: 2000.0000 [IU] | Freq: Once | INTRAMUSCULAR | Status: DC

## 2013-08-09 MED ORDER — LIDOCAINE HCL (PF) 1 % IJ SOLN: 5.0000 mL | INTRAMUSCULAR | Status: DC | PRN

## 2013-08-09 MED ORDER — VANCOMYCIN HCL IN DEXTROSE 750-5 MG/150ML-% IV SOLN
750.0000 mg | Freq: Once | INTRAVENOUS | Status: AC
  Administered 2013-08-09: 750 mg via INTRAVENOUS
  Filled 2013-08-09: qty 150

## 2013-08-09 MED ORDER — SODIUM CHLORIDE 0.9 % IV SOLN: 100.0000 mL | INTRAVENOUS | Status: DC | PRN

## 2013-08-09 MED ORDER — VANCOMYCIN HCL IN DEXTROSE 750-5 MG/150ML-% IV SOLN
750.0000 mg | INTRAVENOUS | Status: DC
  Administered 2013-08-11: 750 mg via INTRAVENOUS
  Filled 2013-08-09 (×4): qty 150

## 2013-08-09 MED ORDER — HEPARIN SODIUM (PORCINE) 1000 UNIT/ML DIALYSIS
1000.0000 [IU] | INTRAMUSCULAR | Status: DC | PRN
Start: 2013-08-09 — End: 2013-08-09

## 2013-08-09 MED ORDER — DILTIAZEM LOAD VIA INFUSION
10.0000 mg | Freq: Once | INTRAVENOUS | Status: AC
  Administered 2013-08-09: 10 mg via INTRAVENOUS
  Filled 2013-08-09: qty 10

## 2013-08-09 MED ORDER — DOXERCALCIFEROL 4 MCG/2ML IV SOLN
INTRAVENOUS | Status: AC
  Filled 2013-08-09: qty 4

## 2013-08-09 MED ORDER — ALTEPLASE 2 MG IJ SOLR: 2.0000 mg | Freq: Once | INTRAMUSCULAR | Status: AC | PRN

## 2013-08-09 MED ORDER — METRONIDAZOLE 500 MG PO TABS
500.0000 mg | ORAL_TABLET | Freq: Three times a day (TID) | ORAL | Status: DC
  Administered 2013-08-09: 500 mg via ORAL
  Filled 2013-08-09 (×3): qty 1

## 2013-08-09 MED ORDER — LIDOCAINE-PRILOCAINE 2.5-2.5 % EX CREA: 1.0000 "application " | TOPICAL_CREAM | CUTANEOUS | Status: DC | PRN

## 2013-08-09 NOTE — Progress Notes (Signed)
Patient requires to be on Cardiazem drip due to results of EKG. Patient is being transferred to 2 [place] per MD, report given to RN at [facility].

## 2013-08-09 NOTE — Progress Notes (Signed)
*  PRELIMINARY RESULTS* Vascular Ultrasound Left lower extremity venous duplex has been completed.  Preliminary findings: no evidence of DVT.   Landry Mellow, RDMS, RVT  08/09/2013, 5:41 PM

## 2013-08-09 NOTE — Progress Notes (Signed)
Subjective: Doing well.  No CP or SOB.  Objective: Vital signs in last 24 hours: Temp:  [98 F (36.7 C)-99.4 F (37.4 C)] 98.6 F (37 C) (02/18 0740) Pulse Rate:  [91-112] 111 (02/18 0930) Resp:  [16-24] 17 (02/18 0802) BP: (121-158)/(59-96) 132/81 mmHg (02/18 0930) SpO2:  [77 %-96 %] 96 % (02/18 0740) Weight:  [172 lb 12.8 oz (78.382 kg)-177 lb 4 oz (80.4 kg)] 174 lb 2.6 oz (79 kg) (02/18 0705) Last BM Date: 08/08/13  Intake/Output from previous day: 02/17 0701 - 02/18 0700 In: 280 [P.O.:280] Out: 2011  Intake/Output this shift:    Medications Current Facility-Administered Medications  Medication Dose Route Frequency Provider Last Rate Last Dose  . 0.9 %  sodium chloride infusion  100 mL Intravenous PRN Sol Blazing, MD      . acetaminophen (TYLENOL) suppository 650 mg  650 mg Rectal Q6H PRN [name], MD      . acetaminophen (TYLENOL) tablet 325-650 mg  325-650 mg Oral Q6H PRN [name], MD      . albuterol (PROVENTIL) (2.5 MG/3ML) 0.083% nebulizer solution 2.5 mg  2.5 mg Nebulization Q2H PRN Tammy S Parrett, NP      . alteplase (CATHFLO ACTIVASE) injection 2 mg  2 mg Intracatheter Once PRN Sol Blazing, MD      . aztreonam (AZACTAM) 1 g in dextrose 5 % 50 mL IVPB  1 g Intravenous Q24H Kendra P Hiatt, RPH   1 g at 08/08/13 1011  . bisacodyl (DULCOLAX) suppository 10 mg  10 mg Rectal Q12H PRN [name], MD      . doxercalciferol (HECTOROL) 4 MCG/2ML injection           . doxercalciferol (HECTOROL) injection 5 mcg  5 mcg Intravenous Q M,W,F-HD Alvia Grove, PA-C   5 mcg at 08/09/13 5956  . feeding supplement (NEPRO CARB STEADY) liquid 237 mL  237 mL Oral PRN Sol Blazing, MD      . feeding supplement (NEPRO CARB STEADY) liquid 237 mL  237 mL Oral PRN Sol Blazing, MD      . heparin injection 5,000 Units  5,000 Units Subcutaneous 3 times per day Melvenia Needles, NP   5,000 Units at 08/09/13 0616  . HYDROmorphone (DILAUDID) injection 0.5  mg  0.5 mg Intravenous Q4H PRN Mariea Clonts, MD   0.5 mg at 08/08/13 2022  . insulin aspart (novoLOG) injection 0-5 Units  0-5 Units Subcutaneous QHS Jessica U Vann, DO      . insulin aspart (novoLOG) injection 0-9 Units  0-9 Units Subcutaneous TID WC Jessica U Vann, DO      . ipratropium-albuterol (DUONEB) 0.5-2.5 (3) MG/3ML nebulizer solution 3 mL  3 mL Nebulization TID Sol Blazing, MD   3 mL at 08/08/13 2048  . lactulose (CHRONULAC) 10 GM/15ML solution 20 g  20 g Oral BID PRN [name], MD      . lidocaine (PF) (XYLOCAINE) 1 % injection 5 mL  5 mL Intradermal PRN Sol Blazing, MD      . lidocaine-prilocaine (EMLA) cream 1 application  1 application Topical PRN Sol Blazing, MD      . lip balm (BLISTEX) ointment 1 application  1 application Topical BID [name], MD   1 application at 38/75/64 2031  . magic mouthwash  15 mL Oral QID PRN [name], MD      . metroNIDAZOLE (FLAGYL)  tablet 500 mg  500 mg Oral 3 times per day Orson Eva, MD      . multivitamin (RENA-VIT) tablet 1 tablet  1 tablet Oral QHS Alvia Grove, PA-C   1 tablet at 08/08/13 2021  . oseltamivir (TAMIFLU) capsule 30 mg  30 mg Oral Daily Geradine Girt, DO   30 mg at 08/08/13 1227  . pantoprazole (PROTONIX) EC tablet 40 mg  40 mg Oral Daily Tammy S Parrett, NP   40 mg at 08/08/13 1007  . pentafluoroprop-tetrafluoroeth (GEBAUERS) aerosol 1 application  1 application Topical PRN Sol Blazing, MD      . polyethylene glycol (MIRALAX / GLYCOLAX) packet 17 g  17 g Oral BID [name], MD   17 g at 08/08/13 2019  . saccharomyces boulardii (FLORASTOR) capsule 250 mg  250 mg Oral BID [name], MD   250 mg at 08/08/13 2020  . vancomycin (VANCOCIN) IVPB 750 mg/150 ml premix  750 mg Intravenous Once Orson Eva, MD        PE: General appearance: alert, cooperative and no distress  Lungs: clear to auscultation bilaterally  Heart: regularly irregular rhythm  Extremities: No LEE  Pulses: 2+  and symmetric  Skin: Warm and dry  Neurologic: Grossly normal   Lab Results:   Recent Labs  08/07/13 0144 08/08/13 1504 08/09/13 0857  WBC 10.3 3.7* 2.4*  HGB 11.6* 11.6* 11.1*  HCT 36.5* 36.5* 35.2*  PLT 111* 110* 95*   BMET  Recent Labs  08/07/13 0144 08/08/13 1504 08/09/13 0813  NA 137 137 132*  K 3.2* 4.0 5.9*  CL 95* 95* 94*  CO2 27 26 26   GLUCOSE 103* 120* 98  BUN 20 25* 20  CREATININE 3.39* 3.94* 3.55*  CALCIUM 8.2* 8.6 8.4    Assessment/Plan  Principal Problem:   Severe sepsis(995.92) Active Problems:   Acute respiratory failure   Diabetes mellitus   HTN (hypertension)   ESRD (end stage renal disease) on dialysis   Anemia of chronic disease   Protein-calorie malnutrition, severe   Other malaise and fatigue   COPD GOLD II   Peritonitis   HCAP (healthcare-associated pneumonia)   CAPD (continuous ambulatory peritoneal dialysis) catheter in place   Elevated troponin level  Plan:  Echo EF 40-45% with normal wall motion, grade two diastolic. Dysfunction Ischemic evaluation once current illness resolves. BP stable.  Sinus tach on tele.  Pacs.    LOS: 3 days    HAGER, BRYAN 08/09/2013 10:17 AM  I have seen and examined the patient along with Tarri Fuller, PA.  I have reviewed the chart, notes and new data.  I agree with PA's note.  PLAN: Improved. Lexiscan Myoview in AM to evaluate for new reduction in LVEF.  Sanda Klein, MD, [place] [phone] 08/09/2013, 4:26 PM

## 2013-08-09 NOTE — Progress Notes (Signed)
Subjective:   Sleeping on HD, breathing ok. Wants to go home today.  Objective Filed Vitals:   08/09/13 0740 08/09/13 0802 08/09/13 0830 08/09/13 0900  BP: 148/74 138/73 150/87 145/83  Pulse: 100 100 106 107  Temp: 98.6 F (37 C)     TempSrc: Oral     Resp: 19 17    Height:      Weight:      SpO2: 96%       General: sleeping, easily arousable, cooperative, NAD  Heart:RRR, tachy, no murmur or rub  Lungs: faint bibasilar crackles. Regular and unlabored. 2L02 Abdomen: Slightly distented, nontender, Abd binder in place. PD cath with dressing clean and dry, no drainage noted.  Extremities: +1 LE pitting edema  Dialysis Access: LUA AVF +bruit/thrill    Dialysis Orders: MWF @ [facility]  4 hrs 79 kg 400/A1.5 2K/2.25Ca Heparin 2400 U AVF @ LUA  Hectorol 5 mcg Epogen 0 Venofer 0   Assessment/Plan:  1. Abdominal pain/Peritonitis - Afebrile. PD catheter placed 2/10, purulent drainage on admission but dressing currently clean and dry and ? PD catheter displacement; IV Aztreonam, Flagyl & Vancomycin started. Surgery has seen. External tubing replaced sterilely. Plans replacement of PD cath in OR if peritonitis does not resolve. WBC 2.4 2. Hyperkalemia - resolved with HD. K+5.9 pre HD today 3. ESRD - MWF, HD today goal 3 L. olc green 4. HyPOtension/volume - 138/74 hypotension resolving.Home Metoprolol 12.5 mg bid on hold; Reaching EDWbut still with vol xs on exam. Needs lower edw. 5. Anemia - Hgb 11.1, last T-sat 38% with ferritin 1936; no outpatient Epogen or Fe. Watch CBC 6. Metabolic bone disease - Ca 8.4 P 3.8; Hectorol 5 mcg, Hold Renvela 3 pending resolution of #1  7. Nutrition - Alb 3.2, Renal/carb mod diet, nepro. multivitamin. 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 9. 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  Shelle Iron, NP [place] [phone] 08/09/2013,9:35 AM  LOS: 3 days   Renal Attending: Had dialysis yesterday and 2000cc  removed. For dialysis again currently in progress.  Edema is less. Stable hemodynamics. Clinically improved Will challenge off oxygen. ACPowell, MD  Additional Objective Labs: Basic Metabolic Panel:  Recent Labs Lab 08/07/13 0144 08/08/13 1504 08/09/13 0813  NA 137 137 132*  K 3.2* 4.0 5.9*  CL 95* 95* 94*  CO2 27 26 26   GLUCOSE 103* 120* 98  BUN 20 25* 20  CREATININE 3.39* 3.94* 3.55*  CALCIUM 8.2* 8.6 8.4  PHOS  --  3.3 3.8   Liver Function Tests:  Recent Labs Lab 08/06/13 0555 08/08/13 1504 08/09/13 0813  AST 81*  --   --   ALT 31  --   --   ALKPHOS 128*  --   --   BILITOT 1.1  --   --   PROT 7.3  --   --   ALBUMIN 3.2* 2.5* 2.4*    Recent Labs Lab 08/06/13 1219  LIPASE 34  AMYLASE 1053*   CBC:  Recent Labs Lab 08/06/13 0555 08/06/13 1219 08/07/13 0144 08/08/13 1504 08/09/13 0857  WBC 5.9 8.9 10.3 3.7* 2.4*  NEUTROABS 4.1  --   --   --   --   HGB 14.0 12.1* 11.6* 11.6* 11.1*  HCT 44.8 38.6* 36.5* 36.5* 35.2*  MCV 100.7* 97.7 97.6 96.8 96.4  PLT 192 100* 111* 110* 95*   Blood Culture    Component Value Date/Time   SDES URINE, RANDOM 08/07/2013  [place] 08/07/2013 1855   SPECREQUEST NONE 08/07/2013 1855   SPECREQUEST NONE 08/07/2013 1855   CULT  Value: NO GROWTH Performed at [facility] 08/07/2013 1855   REPTSTATUS 08/08/2013 FINAL 08/07/2013 1855   REPTSTATUS 08/08/2013 FINAL 08/07/2013 1855    Cardiac Enzymes:  Recent Labs Lab 08/06/13 1219 08/06/13 1935 08/07/13 0144 08/07/13 0815  TROPONINI 0.94* 1.31* 1.19* 0.89*   CBG:  Recent Labs Lab 08/08/13 0407 08/08/13 0745 08/08/13 1220 08/08/13 1813 08/08/13 2107  GLUCAP 75 91 102* 83 102*   Iron Studies: No results found for this basename: IRON, TIBC, TRANSFERRIN, FERRITIN,  in the last 72 hours @lablastinr3 @ Studies/Results: No results found. Medications:   . aztreonam  1 g Intravenous Q24H  . doxercalciferol  5 mcg Intravenous Q M,W,F-HD  .  heparin  5,000 Units Subcutaneous 3 times per day  . insulin aspart  0-5 Units Subcutaneous QHS  . insulin aspart  0-9 Units Subcutaneous TID WC  . ipratropium-albuterol  3 mL Nebulization TID  . lip balm  1 application Topical BID  . metroNIDAZOLE  500 mg Oral 3 times per day  . multivitamin  1 tablet Oral QHS  . oseltamivir  30 mg Oral Daily  . pantoprazole  40 mg Oral Daily  . polyethylene glycol  17 g Oral BID  . saccharomyces boulardii  250 mg Oral BID  . vancomycin  750 mg Intravenous Once

## 2013-08-09 NOTE — Progress Notes (Addendum)
To provide support and encouragement, visited with patient on [facility] hemodialysis unit after H&N Conference.   Pt expressed that he felt tired, "just can't make any progress".  Continuing to navigate as L3 (treatments completed) patient.  Gayleen Orem, RN, BSN, [facility] Head [facility] Neck Oncology Navigator [phone]

## 2013-08-09 NOTE — Progress Notes (Signed)
[facility] PROGRESS NOTE  DERRICO ZHONG BHA:[id number] DOB: 09-27-1945 DOA: 08/06/2013 PCP: [name], MD  Interim summary 68 y.o. 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 Johney Maine on 08/01/13, but last night awoke with severe abdominal pain, chills, and weakness and presented to the ED with fever of 101.9.  Pt had difficulty post op with inability to protect airway w/ reintubation requiring vent support overnight, He received HD and was extubated w/ discharge home on 2/11. Pt has an extensive hx with prolonged critical illness 01/2013 w/ Acute resp failrue requiring trach now decannulated.    Presented to ER on 07/1513 am with fever, abd pain , weakness. B/P marginal ~106, tachycardia. Lactate 13 . Felt to have SIRS/Sepsis . CT abd /pelvis showed mod right pleural effusion , bibasilar aspdz. No acute abd process noted.  PCCM was asked to admit.  Antibiotics were started, and Nephrology and  General Surgery was consulted.  On the day of presentation, the patient had a chest x-ray findings suggestive of pulmonary edema with a potassium of 6.4. The patient was dialyzed on 08/06/2013.  The patient's external tubing was replaced under a sterile environment.  The patient was also noted to have elevated troponins for which cardiology was consulted. They felt this was likely due to demand ischemia in setting of ESRD.   Assessment/Plan: Acute on chronic respiratory failure  -secondary to Bilateral pleural effusions/fluid overloa  -COPD on nocturnal O2 w/out exacerbation  -2/16 pulm edema on x ray  -Goal SpO2>92  Supplemental oxygen PRN  Albuterol / Ipratropium PRN  -HD  Severe sepsis  -improved  -Continue antibiotics -continue IV vanco -d/c  aztreonam and Flagyl -start ceftazidime 2 grams with HD -discussed with Dr. Florene Glen Elevated troponin:  -Most likely from demand ischemia in the setting of ESRD -ischemic workup once current illness resolves -Echocardiogram EF 97-35%, grade 2 diastolic dysfunction, no WMA -trop (1.31 max) trending down today  Hold ASA, Lopressor  Echo: EF 32% with diasolic dfxn  Cardiology consulted  ESRD on HD - MWF  -Try to maintain CAPD catheter as long as the patient clinically improves -HD per renal  -Trend BMP  peritonitis  -S/p PD cath placement 2/10: Flushing well, healing well externally (? Extension tubing pulled off at home)   -if fails to improve condition will need to remove PD cath in OR - has improved thus far  -Continue vancomycin, aztreonam, flagyl Flu A  -added tamiflu  DM  -intermitten Hypoglycemia duet to glipizide -SSI  -CBG 69-104; D50 IV as needed for hypoglycemia  -Hold Glipizide  Tachycardia -EKG -restart metoprolol tartrate Lower extremity edema -Left greater than right -Venous duplex rule out DVT Family Communication:  Son at bedside Disposition Plan:   Home when medically stable     Antibiotics:  Vancomycin 08/06/2013>>>  Ceftazidime 08/09/2013>>>  Aztreonam 08/06/2013>>> 08/09/2013  Flagyl 08/06/13>>>08/09/13    Procedures/Studies: Ct Abdomen Pelvis Wo Contrast  08/06/2013   CLINICAL DATA:  Abdominal pain and fever. Recent surgical procedure for peritoneal dialysis 5 days ago.  EXAM: CT ABDOMEN AND PELVIS WITHOUT CONTRAST  TECHNIQUE: Multidetector CT imaging of the abdomen and pelvis was performed following the standard protocol without intravenous contrast.  COMPARISON:  None.  FINDINGS: Lung bases demonstrate a moderate size right pleural effusion and  small left pleural effusion with associated airspace opacification which may be due to atelectasis or infection. There is mild cardiomegaly.  Abdominal images demonstrate evidence of a prior  cholecystectomy. There is a small amount of perihepatic fluid. There is mild fatty infiltration of the liver with subtle nodular contour. The pancreas and adrenal glands are unremarkable. Appendix is within normal. Kidneys are normal in size with bilateral renal cortical thinning. There is a 6 cm exophytic simple cyst over the mid pole of the left kidney. There are a few small of bilateral hypodensities likely cysts. There is a 1.1 cm homogeneously hyperdense mass over the lower pole of the left kidney with Hounsfield unit measurements 45 likely a hemorrhagic cyst. Ureters are within normal. There is no hydronephrosis or nephrolithiasis. There is diverticulosis of the colon most prominent over the sigmoid colon. There is a peritoneal dialysis catheter entering the left mid abdomen with tip coiled over the pelvis between loops of rectosigmoid colon. There is moderate calcified plaque of the thoracoabdominal aorta and iliac arteries. Minimal free fluid over the right pericolic gutter. No evidence of free peritoneal air.  Remaining pelvic structures are notable only for moderate fecal retention over the rectosigmoid colon. There is mild degenerative change of the spine and hips.  IMPRESSION: Moderate size right pleural effusion and small left pleural effusion with associated airspace opacification in the lower lobes which may be due to pneumonia or atelectasis.  Peritoneal dialysis catheter with tip coiled over the pelvis between several recto sigmoid colonic loops.  Changes involving the liver which may be due to cirrhosis. Small amount of ascites.  Bilateral renal cortical thinning and bilateral renal cysts. 1.1 cm homogeneously hyperdense left renal mass likely a hemorrhagic cyst. Recommend followup CT 6 months.  Diverticulosis of the colon. Moderate fecal retention over the rectosigmoid colon.  Small hiatal hernia.   Electronically Signed   By: Marin Olp M.D.   On: 08/06/2013 07:57   Dg Chest Port 1  View  08/07/2013   CLINICAL DATA:  Shortness of breath, pneumonia  EXAM: PORTABLE CHEST - 1 VIEW  COMPARISON:  DG CHEST 1V PORT dated 08/06/2013; DG CHEST 1V PORT dated 08/02/2013; DG CHEST 2 VIEW dated 04/25/2013; DG CHEST 1V PORT dated 02/16/2013  FINDINGS: Grossly unchanged enlarged cardiac silhouette and mediastinal contours with atherosclerotic plaque within the thoracic aorta. The pulmonary vasculature is indistinct with cephalization of flow. Interval increase in now small layering bilateral effusions with associated worsening bibasilar opacities, left greater than right. No definite evidence of edema. Grossly unchanged bones including presumed resection of the distal end of the right clavicle.  IMPRESSION: Worsening pulmonary edema, small layering bilateral effusions and associated bibasilar opacities, left greater than right, atelectasis versus infiltrate.   Electronically Signed   By: Sandi Mariscal M.D.   On: 08/07/2013 07:36   Dg Chest Port 1 View  08/06/2013   CLINICAL DATA:  68 year old male with shortness of breath, fever abdominal pain cyanosis. Initial encounter.  EXAM: PORTABLE CHEST - 1 VIEW  COMPARISON:  08/02/2013 and earlier.  FINDINGS: Portable AP semi upright view at 0612 hrs.  Extubated. Decreased bilateral veiling pulmonary opacity. Improved retrocardiac ventilation. Stable cardiac size and mediastinal contours. No pneumothorax. Stable pulmonary vascularity/interstitial opacity.  IMPRESSION: 1. Extubated. 2. Decreased appearance of bilateral pleural effusions and improved bibasilar ventilation. 3. Continued  pulmonary vascular congestion/interstitial edema.   Electronically Signed   By: Lars Pinks M.D.   On: 08/06/2013 06:31   Dg Chest [facility]  08/02/2013   CLINICAL DATA:  Respiratory failure.  EXAM: PORTABLE CHEST - 1 VIEW  COMPARISON:  DG CHEST 1V PORT dated 08/01/2013  FINDINGS: Endotracheal tube is approximately 3 cm above the carina. Degree of pulmonary edema at may be mildly  improved. There are likely bilateral pleural effusions. The heart remains moderately enlarged.  IMPRESSION: Mild decrease in pulmonary edema.   Electronically Signed   By: Aletta Edouard M.D.   On: 08/02/2013 07:40   Dg Chest Port 1 View  08/01/2013   CLINICAL DATA:  Status post re-intubation  EXAM: PORTABLE CHEST - 1 VIEW  COMPARISON:  05/22/2013  FINDINGS: An endotracheal tube is seen 3.5 cm above the carinal. The cardiac shadow is prominent. Vascular congestion with mild posteriorly layering effusions is identified. A temporary dialysis catheter is not seen on this film. Correlation with the clinical findings is recommended.  IMPRESSION: Vascular congestion and effusions which may be related to volume overload.  Endotracheal tube as described.   Electronically Signed   By: Inez Catalina M.D.   On: 08/01/2013 16:22         Subjective: Patient is feeling better. Abdominal pain is improving. Denies any headache, chest discomfort, shortness of breath, nausea, vomiting, diarrhea, abdominal pain, dysuria.  Objective: Filed Vitals:   08/09/13 1100 08/09/13 1130 08/09/13 1205 08/09/13 1322  BP: 140/74 136/66 135/81 142/77  Pulse: 141 140 135 120  Temp:   98.5 F (36.9 C) 99.3 F (37.4 C)  TempSrc:   Oral Oral  Resp:   20 20  Height:      Weight:   75.6 kg (166 lb 10.7 oz)   SpO2:   92% 91%    Intake/Output Summary (Last 24 hours) at 08/09/13 1410 Last data filed at 08/09/13 1205  Gross per 24 hour  Intake      0 ml  Output   4821 ml  Net  -4821 ml   Weight change: 0.3 kg (10.6 oz) Exam:   General:  Pt is alert, follows commands appropriately, not in acute distress  HEENT: No icterus, No thrush,  [place]/AT  Cardiovascular: RRR, S1/S2, no rubs, no gallops  Respiratory: Bibasilar crackles. No wheezing.  Abdomen: Soft/+BS, non tender, non distended, no guarding  Extremities: 2+ L>R edema, No lymphangitis, No petechiae, No rashes, no synovitis  Data Reviewed: Basic Metabolic  Panel:  Recent Labs Lab 08/06/13 0555 08/06/13 1219 08/07/13 0144 08/08/13 1504 08/09/13 0813  NA 137 137 137 137 132*  K 6.4* 4.3 3.2* 4.0 5.9*  CL 88* 95* 95* 95* 94*  CO2 16* $Remov'19 27 26 26  'mGVLBO$ GLUCOSE 71 89 103* 120* 98  BUN 35* 41* 20 25* 20  CREATININE 5.76* 5.77*  5.92* 3.39* 3.94* 3.55*  CALCIUM 9.9 8.6 8.2* 8.6 8.4  PHOS  --   --   --  3.3 3.8   Liver Function Tests:  Recent Labs Lab 08/06/13 0555 08/08/13 1504 08/09/13 0813  AST 81*  --   --   ALT 31  --   --   ALKPHOS 128*  --   --   BILITOT 1.1  --   --   PROT 7.3  --   --   ALBUMIN 3.2* 2.5* 2.4*    Recent Labs Lab 08/06/13 1219  LIPASE 34  AMYLASE 1053*   No results found for this basename: AMMONIA,  in the last 168 hours CBC:  Recent Labs Lab 08/06/13 0555 08/06/13 1219 08/07/13 0144 08/08/13 1504 08/09/13 0857  WBC  5.9 8.9 10.3 3.7* 2.4*  NEUTROABS 4.1  --   --   --   --   HGB 14.0 12.1* 11.6* 11.6* 11.1*  HCT 44.8 38.6* 36.5* 36.5* 35.2*  MCV 100.7* 97.7 97.6 96.8 96.4  PLT 192 100* 111* 110* 95*   Cardiac Enzymes:  Recent Labs Lab 08/06/13 1219 08/06/13 1935 08/07/13 0144 08/07/13 0815  TROPONINI 0.94* 1.31* 1.19* 0.89*   BNP: No components found with this basename: POCBNP,  CBG:  Recent Labs Lab 08/08/13 0745 08/08/13 1220 08/08/13 1813 08/08/13 2107 08/09/13 1321  GLUCAP 91 102* 83 102* 65*    Recent Results (from the past 240 hour(s))  MRSA PCR SCREENING     Status: None   Collection Time    08/01/13  5:41 PM      Result Value Ref Range Status   MRSA by PCR NEGATIVE  NEGATIVE Final   Comment:            The GeneXpert MRSA Assay (FDA     approved for NASAL specimens     only), is one component of a     comprehensive MRSA colonization     surveillance program. It is not     intended to diagnose MRSA     infection nor to guide or     monitor treatment for     MRSA infections.  CULTURE, BLOOD (ROUTINE X 2)     Status: None   Collection Time    08/06/13   5:55 AM      Result Value Ref Range Status   Specimen Description BLOOD FOREARM RIGHT   Final   Special Requests BOTTLES DRAWN AEROBIC AND ANAEROBIC 6ML   Final   Culture  Setup Time     Final   Value: 08/06/2013 13:29     Performed at [facility]   Culture     Final   Value:        BLOOD CULTURE RECEIVED NO GROWTH TO DATE CULTURE WILL BE HELD FOR 5 DAYS BEFORE ISSUING A FINAL NEGATIVE REPORT     Performed at [facility]   Report Status PENDING   Incomplete  CULTURE, BLOOD (ROUTINE X 2)     Status: None   Collection Time    08/06/13  6:30 AM      Result Value Ref Range Status   Specimen Description BLOOD RIGHT FOREARM   Final   Special Requests BOTTLES DRAWN AEROBIC AND ANAEROBIC 6CC   Final   Culture  Setup Time     Final   Value: 08/06/2013 17:59     Performed at [facility]   Culture     Final   Value:        BLOOD CULTURE RECEIVED NO GROWTH TO DATE CULTURE WILL BE HELD FOR 5 DAYS BEFORE ISSUING A FINAL NEGATIVE REPORT     Performed at [facility]   Report Status PENDING   Incomplete  MRSA PCR SCREENING     Status: None   Collection Time    08/06/13 10:42 AM      Result Value Ref Range Status   MRSA by PCR NEGATIVE  NEGATIVE Final   Comment:            The GeneXpert MRSA Assay (FDA     approved for NASAL specimens     only), is one component of a     comprehensive MRSA colonization     surveillance program. It is  not     intended to diagnose MRSA     infection nor to guide or     monitor treatment for     MRSA infections.  RESPIRATORY VIRUS PANEL     Status: Abnormal   Collection Time    08/06/13 11:20 AM      Result Value Ref Range Status   Source - RVPAN NASOPHARYNGEAL   Final   Respiratory Syncytial Virus A NOT DETECTED   Final   Respiratory Syncytial Virus B NOT DETECTED   Final   Influenza A DETECTED (*)  Final   Influenza B NOT DETECTED   Final   Parainfluenza 1 NOT DETECTED   Final   Parainfluenza 2 NOT DETECTED   Final    Parainfluenza 3 NOT DETECTED   Final   Metapneumovirus NOT DETECTED   Final   Rhinovirus NOT DETECTED   Final   Adenovirus NOT DETECTED   Final   Influenza A H1 NOT DETECTED   Final   Influenza A H3 DETECTED (*)  Final   Comment: (NOTE)           Normal Reference Range for each Analyte: NOT DETECTED     Testing performed using the Luminex xTAG Respiratory Viral Panel test     kit.     This test was developed and its performance characteristics determined     by [facility]. It has not been cleared or approved by the [place]     Food and Drug Administration. This test is used for clinical purposes.     It should not be regarded as investigational or for research. This     laboratory is certified under the Clinical Laboratory Improvement     Amendments of 1988 (CLIA) as qualified to perform high complexity     clinical laboratory testing.     Performed at [facility]  URINE CULTURE     Status: None   Collection Time    08/07/13  6:55 PM      Result Value Ref Range Status   Specimen Description URINE, RANDOM   Final   Special Requests NONE   Final   Culture  Setup Time     Final   Value: 08/07/2013 20:25     Performed at [facility] Count     Final   Value: NO GROWTH     Performed at [facility]   Culture     Final   Value: NO GROWTH     Performed at [facility]   Report Status 08/08/2013 FINAL   Final     Scheduled Meds: . aztreonam  1 g Intravenous Q24H  . doxercalciferol  5 mcg Intravenous Q M,W,F-HD  . heparin  5,000 Units Subcutaneous 3 times per day  . insulin aspart  0-5 Units Subcutaneous QHS  . insulin aspart  0-9 Units Subcutaneous TID WC  . ipratropium-albuterol  3 mL Nebulization TID  . lip balm  1 application Topical BID  . metroNIDAZOLE  500 mg Oral 3 times per day  . multivitamin  1 tablet Oral QHS  . oseltamivir  30 mg Oral Daily  . pantoprazole  40 mg Oral Daily  . polyethylene glycol  17 g Oral BID  .  saccharomyces boulardii  250 mg Oral BID  . vancomycin  750 mg Intravenous Once   Continuous Infusions:    Willella Harding, DO  [facility] Pager [phone]  If 7PM-7AM, please  contact night-coverage [email or web address] Password [email or web address] 08/09/2013, 2:10 PM   LOS: 3 days

## 2013-08-10 ENCOUNTER — Other Ambulatory Visit (HOSPITAL_COMMUNITY): Payer: Medicare Other

## 2013-08-10 ENCOUNTER — Encounter (HOSPITAL_COMMUNITY): Payer: Self-pay | Admitting: Physician Assistant

## 2013-08-10 ENCOUNTER — Inpatient Hospital Stay (HOSPITAL_COMMUNITY): Payer: Medicare Other

## 2013-08-10 DIAGNOSIS — I509 Heart failure, unspecified: Secondary | ICD-10-CM | POA: Diagnosis present

## 2013-08-10 DIAGNOSIS — D72819 Decreased white blood cell count, unspecified: Secondary | ICD-10-CM

## 2013-08-10 DIAGNOSIS — D696 Thrombocytopenia, unspecified: Secondary | ICD-10-CM

## 2013-08-10 DIAGNOSIS — I4892 Unspecified atrial flutter: Secondary | ICD-10-CM | POA: Diagnosis present

## 2013-08-10 LAB — BASIC METABOLIC PANEL
BUN: 18 mg/dL (ref 6–23)
CALCIUM: 8 mg/dL — AB (ref 8.4–10.5)
CO2: 24 meq/L (ref 19–32)
CREATININE: 3.1 mg/dL — AB (ref 0.50–1.35)
Chloride: 91 mEq/L — ABNORMAL LOW (ref 96–112)
GFR calc Af Amer: 22 mL/min — ABNORMAL LOW (ref >90)
GFR calc non Af Amer: 19 mL/min — ABNORMAL LOW (ref >90)
GLUCOSE: 232 mg/dL — AB (ref 70–99)
Potassium: 3.4 mEq/L — ABNORMAL LOW (ref 3.7–5.3)
Sodium: 129 mEq/L — ABNORMAL LOW (ref 137–147)

## 2013-08-10 LAB — CBC
HCT: 37.4 % — ABNORMAL LOW (ref 39.0–52.0)
Hemoglobin: 12 g/dL — ABNORMAL LOW (ref 13.0–17.0)
MCH: 30.8 pg (ref 26.0–34.0)
MCHC: 32.1 g/dL (ref 30.0–36.0)
MCV: 96.1 fL (ref 78.0–100.0)
PLATELETS: 110 10*3/uL — AB (ref 150–400)
RBC: 3.89 MIL/uL — ABNORMAL LOW (ref 4.22–5.81)
RDW: 18.9 % — AB (ref 11.5–15.5)
WBC: 3.5 10*3/uL — ABNORMAL LOW (ref 4.0–10.5)

## 2013-08-10 LAB — GLUCOSE, CAPILLARY
GLUCOSE-CAPILLARY: 109 mg/dL — AB (ref 70–99)
GLUCOSE-CAPILLARY: 125 mg/dL — AB (ref 70–99)
Glucose-Capillary: 94 mg/dL (ref 70–99)
Glucose-Capillary: 114 mg/dL — ABNORMAL HIGH (ref 70–99)

## 2013-08-10 MED ORDER — SODIUM CHLORIDE 0.9 % IV SOLN: 100.0000 mL | INTRAVENOUS | Status: DC | PRN

## 2013-08-10 MED ORDER — HEPARIN SODIUM (PORCINE) 1000 UNIT/ML DIALYSIS
1000.0000 [IU] | INTRAMUSCULAR | Status: DC | PRN
  Filled 2013-08-10: qty 1

## 2013-08-10 MED ORDER — ALTEPLASE 2 MG IJ SOLR: 2.0000 mg | Freq: Once | INTRAMUSCULAR | Status: DC | PRN

## 2013-08-10 MED ORDER — PENTAFLUOROPROP-TETRAFLUOROETH EX AERO: 1.0000 "application " | INHALATION_SPRAY | CUTANEOUS | Status: DC | PRN

## 2013-08-10 MED ORDER — ALTEPLASE 2 MG IJ SOLR
2.0000 mg | Freq: Once | INTRAMUSCULAR | Status: AC | PRN
  Filled 2013-08-10: qty 2

## 2013-08-10 MED ORDER — LIDOCAINE HCL (PF) 1 % IJ SOLN: 5.0000 mL | INTRAMUSCULAR | Status: DC | PRN

## 2013-08-10 MED ORDER — LIDOCAINE-PRILOCAINE 2.5-2.5 % EX CREA: 1.0000 "application " | TOPICAL_CREAM | CUTANEOUS | Status: DC | PRN

## 2013-08-10 MED ORDER — NEPRO/CARBSTEADY PO LIQD
237.0000 mL | ORAL | Status: DC | PRN
  Filled 2013-08-10: qty 237

## 2013-08-10 MED ORDER — HEPARIN SODIUM (PORCINE) 1000 UNIT/ML DIALYSIS: 20.0000 [IU]/kg | INTRAMUSCULAR | Status: DC | PRN

## 2013-08-10 MED ORDER — NEPRO/CARBSTEADY PO LIQD: 237.0000 mL | ORAL | Status: DC | PRN

## 2013-08-10 NOTE — Progress Notes (Signed)
ANTIBIOTIC CONSULT NOTE - FOLLOW UP  Pharmacy Consult for Vancomycin, Tressie Ellis Indication: HCAP, sepsis, peritionitis   Allergies  Allergen Reactions  . Amoxicillin     Causes upset stomach  . Fentanyl     Confusion when patch applied  . Nsaids Other (See Comments)    Severe kidney disease/ESRD    Labs:  Recent Labs  08/08/13 1504 08/09/13 0813 08/09/13 0857  WBC 3.7*  --  2.4*  HGB 11.6*  --  11.1*  PLT 110*  --  95*  CREATININE 3.94* 3.55*  --    Estimated Creatinine Clearance: 20.8 ml/min (by C-G formula based on Cr of 3.55). No results found for this basename: VANCOTROUGH, Corlis Leak, VANCORANDOM, [place], GENTPEAK, GENTRANDOM, TOBRATROUGH, TOBRAPEAK, TOBRARND, AMIKACINPEAK, AMIKACINTROU, AMIKACIN,  in the last 72 hours   Microbiology: Recent Results (from the past 720 hour(s))  MRSA PCR SCREENING     Status: None   Collection Time    08/01/13  5:41 PM      Result Value Ref Range Status   MRSA by PCR NEGATIVE  NEGATIVE Final   Comment:            The GeneXpert MRSA Assay (FDA     approved for NASAL specimens     only), is one component of a     comprehensive MRSA colonization     surveillance program. It is not     intended to diagnose MRSA     infection nor to guide or     monitor treatment for     MRSA infections.  CULTURE, BLOOD (ROUTINE X 2)     Status: None   Collection Time    08/06/13  5:55 AM      Result Value Ref Range Status   Specimen Description BLOOD FOREARM RIGHT   Final   Special Requests BOTTLES DRAWN AEROBIC AND ANAEROBIC 6ML   Final   Culture  Setup Time     Final   Value: 08/06/2013 13:29     Performed at [facility]   Culture     Final   Value:        BLOOD CULTURE RECEIVED NO GROWTH TO DATE CULTURE WILL BE HELD FOR 5 DAYS BEFORE ISSUING A FINAL NEGATIVE REPORT     Performed at [facility]   Report Status PENDING   Incomplete  CULTURE, BLOOD (ROUTINE X 2)     Status: None   Collection Time    08/06/13  6:30 AM      Result Value Ref Range Status   Specimen Description BLOOD RIGHT FOREARM   Final   Special Requests BOTTLES DRAWN AEROBIC AND ANAEROBIC 6CC   Final   Culture  Setup Time     Final   Value: 08/06/2013 17:59     Performed at [facility]   Culture     Final   Value:        BLOOD CULTURE RECEIVED NO GROWTH TO DATE CULTURE WILL BE HELD FOR 5 DAYS BEFORE ISSUING A FINAL NEGATIVE REPORT     Performed at [facility]   Report Status PENDING   Incomplete  MRSA PCR SCREENING     Status: None   Collection Time    08/06/13 10:42 AM      Result Value Ref Range Status   MRSA by PCR NEGATIVE  NEGATIVE Final   Comment:            The GeneXpert MRSA Assay (FDA  approved for NASAL specimens     only), is one component of a     comprehensive MRSA colonization     surveillance program. It is not     intended to diagnose MRSA     infection nor to guide or     monitor treatment for     MRSA infections.  RESPIRATORY VIRUS PANEL     Status: Abnormal   Collection Time    08/06/13 11:20 AM      Result Value Ref Range Status   Source - RVPAN NASOPHARYNGEAL   Final   Respiratory Syncytial Virus A NOT DETECTED   Final   Respiratory Syncytial Virus B NOT DETECTED   Final   Influenza A DETECTED (*)  Final   Influenza B NOT DETECTED   Final   Parainfluenza 1 NOT DETECTED   Final   Parainfluenza 2 NOT DETECTED   Final   Parainfluenza 3 NOT DETECTED   Final   Metapneumovirus NOT DETECTED   Final   Rhinovirus NOT DETECTED   Final   Adenovirus NOT DETECTED   Final   Influenza A H1 NOT DETECTED   Final   Influenza A H3 DETECTED (*)  Final   Comment: (NOTE)           Normal Reference Range for each Analyte: NOT DETECTED     Testing performed using the Luminex xTAG Respiratory Viral Panel test     kit.     This test was developed and its performance characteristics determined     by [facility]. It has not been cleared or approved by the [place]     Food and Drug Administration.  This test is used for clinical purposes.     It should not be regarded as investigational or for research. This     laboratory is certified under the [place] (CLIA) as qualified to perform high complexity     clinical laboratory testing.     Performed at [place] [facility]     Status: None   Collection Time    08/07/13  6:55 PM      Result Value Ref Range Status   Specimen Description URINE, RANDOM   Final   Special Requests NONE   Final   Culture  Setup Time     Final   Value: 08/07/2013 20:25     Performed at [place]     Final   Value: NO GROWTH     Performed at [facility]   Culture     Final   Value: NO GROWTH     Performed at [facility]   Report Status 08/08/2013 FINAL   Final    Anti-infectives   Start     Dose/Rate Route Frequency Ordered Stop   08/11/13 1200  cefTAZidime (FORTAZ) 2 g in dextrose 5 % 50 mL IVPB     2 g 100 mL/hr over 30 Minutes Intravenous Every M-W-F (Hemodialysis) 08/09/13 1444     08/11/13 1200  vancomycin (VANCOCIN) IVPB 750 mg/150 ml premix     750 mg 150 mL/hr over 60 Minutes Intravenous Every M-W-F (Hemodialysis) 08/09/13 1617     08/09/13 1530  cefTAZidime (FORTAZ) 2 g in dextrose 5 % 50 mL IVPB     2 g 100 mL/hr over 30 Minutes Intravenous  Once 08/09/13 1443 08/09/13 1613   08/09/13 1400  metroNIDAZOLE (FLAGYL) tablet 500 mg  Status:  Discontinued     500 mg Oral 3 times per day 08/09/13 0836 08/09/13 1443   08/09/13 0845  vancomycin (VANCOCIN) IVPB 750 mg/150 ml premix     750 mg 150 mL/hr over 60 Minutes Intravenous  Once 08/09/13 0836 08/09/13 1522   08/08/13 1230  oseltamivir (TAMIFLU) capsule 30 mg     30 mg Oral Daily 08/08/13 1132 08/13/13 0959   08/08/13 1115  vancomycin (VANCOCIN) IVPB 750 mg/150 ml premix     750 mg 150 mL/hr over 60 Minutes Intravenous  Once 08/08/13 1114 08/08/13 1328   08/07/13 1600  vancomycin  (VANCOCIN) IVPB 750 mg/150 ml premix     750 mg 150 mL/hr over 60 Minutes Intravenous  Once 08/07/13 1346 08/07/13 1636   08/07/13 1100  aztreonam (AZACTAM) 1 g in dextrose 5 % 50 mL IVPB  Status:  Discontinued     1 g 100 mL/hr over 30 Minutes Intravenous Every 24 hours 08/06/13 0955 08/09/13 1443   08/06/13 2000  vancomycin (VANCOCIN) IVPB 750 mg/150 ml premix     750 mg 150 mL/hr over 60 Minutes Intravenous  Once 08/06/13 1718 08/06/13 2343   08/06/13 1400  metroNIDAZOLE (FLAGYL) IVPB 500 mg  Status:  Discontinued     500 mg 100 mL/hr over 60 Minutes Intravenous Every 8 hours 08/06/13 0955 08/09/13 0836   08/06/13 1030  vancomycin (VANCOCIN) 1,500 mg in sodium chloride 0.9 % 500 mL IVPB     1,500 mg 250 mL/hr over 120 Minutes Intravenous NOW 08/06/13 0955 08/06/13 1257   08/06/13 1030  aztreonam (AZACTAM) 1 g in dextrose 5 % 50 mL IVPB     1 g 100 mL/hr over 30 Minutes Intravenous NOW 08/06/13 0955 08/06/13 1127   08/06/13 0630  levofloxacin (LEVAQUIN) IVPB 750 mg     750 mg 100 mL/hr over 90 Minutes Intravenous  Once 08/06/13 0618 08/06/13 0806   08/06/13 0630  metroNIDAZOLE (FLAGYL) IVPB 500 mg     500 mg 100 mL/hr over 60 Minutes Intravenous  Once 08/06/13 0618 08/06/13 0737      Assessment: 68yo male with peritionitis, sepsis now on Vancomycin and Fortaz for peritonitis He has received extra HD sessions for fluid overload  HD days not yet set -- normally MWF  Vancomycin since 2/15, cultures negative  Goal of Therapy:  Pre-HD Vancomycin level 15-25   Plan:  1. Continue Vancomycin 750 mg iv after each HD session (normal days MWF) - consider vancomycin level if to continue, ?LOT 2. Fortaz 2 grams iv Q HD or 1 Gram iv Q 24 hours 3. Continue to follow  Thank you. Anette Guarneri, PharmD [phone] 08/10/2013 9:18 AM

## 2013-08-10 NOTE — Care Management Note (Unsigned)
    Page 1 of 1   08/10/2013     3:44:27 PM   CARE MANAGEMENT NOTE 08/10/2013  Patient:  Victor Lane, Victor Lane   Account Number:  [id number]  Date Initiated:  08/07/2013  Documentation initiated by:  Victor Lane  Subjective/Objective Assessment:   Readmitted with abd pain - peritonitis from PHD cath.     Action/Plan:   Anticipated DC Date:  08/10/2013   Anticipated DC Plan:  [place]  CM consult      Choice offered to / List presented to:             Status of service:  In process, will continue to follow Medicare Important Message given?   (If response is "NO", the following Medicare IM given date fields will be blank) Date Medicare IM given:   Date Additional Medicare IM given:    Discharge Disposition:    Per UR Regulation:  Reviewed for med. necessity/level of care/duration of stay  If discussed at [place] of Stay Meetings, dates discussed:    Comments:  Victor Lane, Victor Lane [phone]                Victor Lane, Victor Lane [phone]   08/10/13 Victor Sokolowski,RN,BSN [phone] PT SCHEDULED FOR STRESS TEST 2/20; P.T. RECOMMENDING OUTPT P.T. UPON DC; PT WAS RECEIVING OP P.T. SERVICES PRIOR TO ADMISSION.

## 2013-08-10 NOTE — Evaluation (Signed)
Physical Therapy Evaluation Patient Details Name: Victor Lane MRN: [id number] DOB: [birth year] Today's Date: 08/10/2013 Time: 3614-4315 PT Time Calculation (min): 60 min  PT Assessment / Plan / Recommendation History of Present Illness  Adm with sepsis (after PD catheter inserted for dialysis 2/10). Now with MI, flu, aflutter. PMHx DM, HD MWF, peripheral neuropathy  Clinical Impression  Pt admitted with sepsis and above problems. Pt recently discharged home with HHPT and had progressed to OPPT. Currently primarily limited by decr SaO2 with activity. Pt currently with functional limitations due to the deficits listed below (see PT Problem List).  Pt will benefit from skilled PT to increase their independence and safety with mobility to allow discharge to the venue listed below.       PT Assessment  Patient needs continued PT services    Follow Up Recommendations  Outpatient PT;Supervision - Intermittent    Does the patient have the potential to tolerate intense rehabilitation      Barriers to Discharge Decreased caregiver support wife now has injured her back; cannot provide physical assist    Equipment Recommendations  None recommended by PT    Recommendations for Other Services OT consult   Frequency Min 3X/week    Precautions / Restrictions Precautions Precautions: Other (comment) Precaution Comments: desaturates without O2   Pertinent Vitals/Pain See vitals flow sheet. Denied pain      Mobility  Bed Mobility Overal bed mobility: Needs Assistance Bed Mobility: Supine to Sit Supine to sit: Supervision;HOB elevated General bed mobility comments: vc for technique as pt reaching for assist; able to complete with cues Transfers Overall transfer level: Needs assistance Equipment used: Rolling walker (2 wheeled) Transfers: Sit to/from Stand Sit to Stand: Supervision General transfer comment: pt was unsure he could stand from bed at lowest height without armrests to  push on, however did very well with no need for assist; also on/off BSC with supervision for lines Ambulation/Gait Ambulation/Gait assistance: Supervision Ambulation Distance (Feet): 35 Feet Assistive device: Rolling walker (2 wheeled) Gait Pattern/deviations: Step-through pattern;Decreased stride length Gait velocity interpretation: <1.8 ft/sec, indicative of risk for recurrent falls General Gait Details: Pt moving slowly and with frequent standing rest breaks (?more due to inattention than fatigue, although pt reports due to fatigue)    Exercises     PT Diagnosis: Difficulty walking  PT Problem List: Decreased activity tolerance;Decreased mobility;Decreased balance;Decreased knowledge of use of DME;Cardiopulmonary status limiting activity PT Treatment Interventions: DME instruction;Gait training;Functional mobility training;Therapeutic activities;Patient/family education     PT Goals(Current goals can be found in the care plan section) Acute Rehab PT Goals Patient Stated Goal: return to OPPT and bypass HHPT PT Goal Formulation: With patient Time For Goal Achievement: 08/17/13 Potential to Achieve Goals: Good  Visit Information  Last PT Received On: 08/10/13 Assistance Needed: +1 History of Present Illness: Adm with sepsis (after PD catheter inserted for dialysis 2/10). Now with MI, flu, aflutter. PMHx DM, HD MWF, peripheral neuropathy       Prior Functioning  Home Living Family/patient expects to be discharged to:: Private residence Living Arrangements: Spouse/significant other Available Help at Discharge: Family Type of Home: House Home Access: Stairs to enter Technical brewer of Steps: 3 Entrance Stairs-Rails: Left Home Layout: One level Mascot: Walker - 2 wheels;Grab bars - tub/shower;Bedside commode Prior Function Level of Independence: Needs assistance Gait / Transfers Assistance Needed: not using RW (wife now using due to bad  back) ADL's / Homemaking  Assistance Needed: doing own shower, dressing, grooming Comments: denies  fall in the past month Communication Communication: No difficulties Dominant Hand: Right    Cognition  Cognition Arousal/Alertness: Awake/alert Behavior During Therapy: WFL for tasks assessed/performed Overall Cognitive Status: Within Functional Limits for tasks assessed    Extremity/Trunk Assessment Upper Extremity Assessment Upper Extremity Assessment: Generalized weakness Lower Extremity Assessment Lower Extremity Assessment: Generalized weakness Cervical / Trunk Assessment Cervical / Trunk Assessment: Normal   Balance Balance Overall balance assessment: Needs assistance Standing balance support: No upper extremity supported Standing balance-Leahy Scale: Fair General Comments General comments (skin integrity, edema, etc.): On arrival, pt shaving with NCO2 off and SaO2 74% (no distress); applied O2 at 3L and slowly SaO2 increased to 89% (over 4 minutes)  End of Session PT - End of Session Equipment Utilized During Treatment: Oxygen Activity Tolerance: Patient limited by fatigue Patient left: in chair;with call bell/phone within reach Nurse Communication: Mobility status;Other (comment) (desaturates without O2)  GP     Aria Jarrard 08/10/2013, 2:51 PM Pager [phone]

## 2013-08-10 NOTE — Progress Notes (Signed)
Assessment/Plan:  1. PD cath infx, cellulitis 2. Hyperkalemia - s/p HD 3. ESRD/Volume overload - MWF, needs lower dry weight  Hemodialysis Friday    Subjective: Interval History: Wgt down to 75.6 post HD (EDW 79); transferred to cardiac unit overnight for cardizem drip  Objective: Vital signs in last 24 hours: Temp:  [97.4 F (36.3 C)-100.3 F (37.9 C)] 97.4 F (36.3 C) (02/19 0424) Pulse Rate:  [100-141] 114 (02/19 0424) Resp:  [18-20] 18 (02/19 0424) BP: (103-142)/(66-81) 116/68 mmHg (02/19 0424) SpO2:  [90 %-92 %] 90 % (02/19 0424) Weight:  [75.6 kg (166 lb 10.7 oz)] 75.6 kg (166 lb 10.7 oz) (02/18 1205) Weight change: -1.4 kg (-3 lb 1.4 oz)  Intake/Output from previous day: 02/18 0701 - 02/19 0700 In: 820 [P.O.:820] Out: 2810  Intake/Output this shift: Total I/O In: 240 [P.O.:240] Out: 151 [Urine:150; Stool:1]  General appearance: alert, cooperative and currently on commode   Lab Results:  Recent Labs  08/08/13 1504 08/09/13 0857  WBC 3.7* 2.4*  HGB 11.6* 11.1*  HCT 36.5* 35.2*  PLT 110* 95*   BMET:  Recent Labs  08/08/13 1504 08/09/13 0813  NA 137 132*  K 4.0 5.9*  CL 95* 94*  CO2 26 26  GLUCOSE 120* 98  BUN 25* 20  CREATININE 3.94* 3.55*  CALCIUM 8.6 8.4   No results found for this basename: PTH,  in the last 72 hours Iron Studies: No results found for this basename: IRON, TIBC, TRANSFERRIN, FERRITIN,  in the last 72 hours Studies/Results: No results found.  Scheduled: . [START ON 08/11/2013] cefTAZidime (FORTAZ)  IV  2 g Intravenous Q M,W,F-HD  . doxercalciferol  5 mcg Intravenous Q M,W,F-HD  . heparin  5,000 Units Subcutaneous 3 times per day  . insulin aspart  0-5 Units Subcutaneous QHS  . insulin aspart  0-9 Units Subcutaneous TID WC  . lip balm  1 application Topical BID  . metoprolol tartrate  12.5 mg Oral BID  . multivitamin  1 tablet Oral QHS  . oseltamivir  30 mg Oral Daily  . pantoprazole  40 mg Oral Daily  . polyethylene  glycol  17 g Oral BID  . regadenoson  0.4 mg Intravenous Once  . saccharomyces boulardii  250 mg Oral BID  . [START ON 08/11/2013] vancomycin  750 mg Intravenous Q M,W,F-HD     LOS: 4 days   Kimm Sider C 08/10/2013,9:24 AM

## 2013-08-10 NOTE — Progress Notes (Signed)
PT Cancellation Note  Patient Details Name: KYM FENTER MRN: [id number] DOB: 12-27-1945   Cancelled Treatment:    Reason Eval/Treat Not Completed: Medical issues which prohibited therapy. RN preparing to do EKG do to "pt's heart is doing something irregular" Will plan to see later today if activity appropriate.   Marasia Newhall 08/10/2013, 10:36 AM Pager [phone]

## 2013-08-10 NOTE — Progress Notes (Signed)
[facility] PROGRESS NOTE  Victor Lane IRS:[id number] DOB: [birth year] DOA: 08/06/2013 PCP: [name], MD  Assessment/Plan: Acute on chronic respiratory failure  -secondary to Bilateral pleural effusions/fluid overloa  -COPD on nocturnal O2 w/out exacerbation  -2/16 pulm edema on x ray  Supplemental oxygen PRN  Albuterol / Ipratropium PRN  -HD  Acute on chronic systolic CHF -EF 00-93% on 08/07/13 echo Severe sepsis  -improved  -Continue antibiotics  -continue IV vanco  -d/c aztreonam and Flagyl  -start ceftazidime 2 grams with HD  Elevated troponin:  -Most likely from demand ischemia in the setting of ESRD  -ischemic workup once current illness resolves  -Echocardiogram EF 81-82%, grade 2 diastolic dysfunction, no WMA  -trop (1.31 max) trending down today  Hold ASA, Lopressor  Echo: EF 99% with diasolic dfxn  Appreciate cardiology followup Paroxysmal atrial flutter -Developed on the evening of 08/09/2013 -Spontaneous conversion to sinus rhythm after Cardizem drip -Given the patient's EF, defer chronotropic agents to cardiology -CHADSVASc= 4 -defer anticoagulation decision to cardiology ESRD on HD - MWF  -Try to maintain CAPD catheter as long as the patient clinically improves  -HD per renal  -Trend BMP  Peritonitis  -S/p PD cath placement 2/10: Flushing well, healing well externally (? 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 Recent surgical procedure for peritoneal dialysis 5 days ago.  EXAM: CT ABDOMEN AND PELVIS WITHOUT CONTRAST  TECHNIQUE: Multidetector CT imaging of the abdomen and pelvis was performed following the standard protocol without intravenous contrast.  COMPARISON:  None.  FINDINGS: Lung bases demonstrate a moderate size right pleural effusion and small left pleural effusion with associated airspace opacification which may be due to atelectasis or infection. There is mild cardiomegaly.  Abdominal images demonstrate evidence of a prior cholecystectomy. There is a small amount of perihepatic fluid. There is mild fatty infiltration of the liver with subtle nodular contour. The pancreas and adrenal glands are unremarkable. Appendix is within normal. Kidneys are normal in size with bilateral renal cortical thinning. There is a 6 cm exophytic simple cyst over the mid pole of the left kidney. There are a few small of bilateral hypodensities likely cysts. There is a 1.1 cm homogeneously hyperdense mass over the lower pole of the left kidney with Hounsfield unit measurements 45 likely a hemorrhagic cyst. Ureters are within normal. There is no hydronephrosis or nephrolithiasis. There is diverticulosis of the colon most prominent over the sigmoid colon. There is a peritoneal dialysis catheter entering the left mid abdomen with tip coiled over the pelvis between loops of rectosigmoid colon. There is  moderate calcified plaque of the thoracoabdominal aorta and iliac  arteries. Minimal free fluid over the right pericolic gutter. No evidence of free peritoneal air.  Remaining pelvic structures are notable only for moderate fecal retention over the rectosigmoid colon. There is mild degenerative change of the spine and hips.  IMPRESSION: Moderate size right pleural effusion and small left pleural effusion with associated airspace opacification in the lower lobes which may be due to pneumonia or atelectasis.  Peritoneal dialysis catheter with tip coiled over the pelvis between several recto sigmoid colonic loops.  Changes involving the liver which may be due to cirrhosis. Small amount of ascites.  Bilateral renal cortical thinning and bilateral renal cysts. 1.1 cm homogeneously hyperdense left renal mass likely a hemorrhagic cyst. Recommend followup CT 6 months.  Diverticulosis of the colon. Moderate fecal retention over the rectosigmoid colon.  Small hiatal hernia.   Electronically Signed   By: Marin Olp M.D.   On: 08/06/2013 07:57   Dg Chest Port 1 View  08/07/2013   CLINICAL DATA:  Shortness of breath, pneumonia  EXAM: PORTABLE CHEST - 1 VIEW  COMPARISON:  DG CHEST 1V PORT dated 08/06/2013; DG CHEST 1V PORT dated 08/02/2013; DG CHEST 2 VIEW dated 04/25/2013; DG CHEST 1V PORT dated 02/16/2013  FINDINGS: Grossly unchanged enlarged cardiac silhouette and mediastinal contours with atherosclerotic plaque within the thoracic aorta. The pulmonary vasculature is indistinct with cephalization of flow. Interval increase in now small layering bilateral effusions with associated worsening bibasilar opacities, left greater than right. No definite evidence of edema. Grossly unchanged bones including presumed resection of the distal end of the right clavicle.  IMPRESSION: Worsening pulmonary edema, small layering bilateral effusions and associated bibasilar opacities, left greater than right, atelectasis versus infiltrate.   Electronically Signed   By: Sandi Mariscal M.D.   On: 08/07/2013 07:36    Dg Chest Port 1 View  08/06/2013   CLINICAL DATA:  68 year old male with shortness of breath, fever abdominal pain cyanosis. Initial encounter.  EXAM: PORTABLE CHEST - 1 VIEW  COMPARISON:  08/02/2013 and earlier.  FINDINGS: Portable AP semi upright view at 0612 hrs.  Extubated. Decreased bilateral veiling pulmonary opacity. Improved retrocardiac ventilation. Stable cardiac size and mediastinal contours. No pneumothorax. Stable pulmonary vascularity/interstitial opacity.  IMPRESSION: 1. Extubated. 2. Decreased appearance of bilateral pleural effusions and improved bibasilar ventilation. 3. Continued  pulmonary vascular congestion/interstitial edema.   Electronically Signed   By: Lars Pinks M.D.   On: 08/06/2013 06:31   Dg Chest Port 1 View  08/02/2013   CLINICAL DATA:  Respiratory failure.  EXAM: PORTABLE CHEST - 1 VIEW  COMPARISON:  DG CHEST 1V PORT dated 08/01/2013  FINDINGS: Endotracheal tube is approximately 3 cm above the carina. Degree of pulmonary edema at may be mildly improved. There are likely bilateral pleural effusions. The heart remains moderately enlarged.  IMPRESSION: Mild decrease in pulmonary edema.   Electronically Signed   By: Aletta Edouard M.D.   On: 08/02/2013 07:40   Dg Chest Port 1 View  08/01/2013   CLINICAL DATA:  Status post re-intubation  EXAM: PORTABLE CHEST - 1 VIEW  COMPARISON:  05/22/2013  FINDINGS: An endotracheal tube is seen 3.5 cm above the carinal. The cardiac shadow is prominent. Vascular congestion with mild posteriorly layering effusions is identified. A temporary dialysis catheter is not seen on this film. Correlation with the clinical findings is recommended.  IMPRESSION: Vascular congestion and effusions which may be related to volume overload.  Endotracheal tube as  described.   Electronically Signed   By: Inez Catalina M.D.   On: 08/01/2013 16:22         Subjective: Patient denies fevers, chills, chest discomfort, nausea, vomiting, diarrhea, abdominal  pain, dizziness, headache. His abdominal pain has improved. He has some dyspnea with exertion.  Objective: Filed Vitals:   08/10/13 0033 08/10/13 0058 08/10/13 0225 08/10/13 0424  BP: 126/74 112/68 107/70 116/68  Pulse: 131 114 100 114  Temp:    97.4 F (36.3 C)  TempSrc:    Oral  Resp:    18  Height:      Weight:      SpO2:    90%    Intake/Output Summary (Last 24 hours) at 08/10/13 1338 Last data filed at 08/10/13 0354  Gross per 24 hour  Intake   1060 ml  Output    151 ml  Net    909 ml   Weight change: -1.4 kg (-3 lb 1.4 oz) Exam:   General:  Pt is alert, follows commands appropriately, not in acute distress  HEENT: No icterus, No thrush, [place]/AT  Cardiovascular: RRR, S1/S2, no rubs, no gallops  Respiratory: Bibasilar crackles. No wheezing. Good air movement.  Abdomen: Soft/+BS, non tender, non distended, no guarding  Extremities: trace LE edema, No lymphangitis, No petechiae, No rashes, no synovitis  Data Reviewed: Basic Metabolic Panel:  Recent Labs Lab 08/06/13 1219 08/07/13 0144 08/08/13 1504 08/09/13 0813 08/10/13 0840  NA 137 137 137 132* 129*  K 4.3 3.2* 4.0 5.9* 3.4*  CL 95* 95* 95* 94* 91*  CO2 $Re'19 27 26 26 24  'Xku$ GLUCOSE 89 103* 120* 98 232*  BUN 41* 20 25* 20 18  CREATININE 5.77*  5.92* 3.39* 3.94* 3.55* 3.10*  CALCIUM 8.6 8.2* 8.6 8.4 8.0*  PHOS  --   --  3.3 3.8  --    Liver Function Tests:  Recent Labs Lab 08/06/13 0555 08/08/13 1504 08/09/13 0813  AST 81*  --   --   ALT 31  --   --   ALKPHOS 128*  --   --   BILITOT 1.1  --   --   PROT 7.3  --   --   ALBUMIN 3.2* 2.5* 2.4*    Recent Labs Lab 08/06/13 1219  LIPASE 34  AMYLASE 1053*   No results found for this basename: AMMONIA,  in the last 168 hours CBC:  Recent Labs Lab 08/06/13 0555 08/06/13 1219 08/07/13 0144 08/08/13 1504 08/09/13 0857 08/10/13 0840  WBC 5.9 8.9 10.3 3.7* 2.4* 3.5*  NEUTROABS 4.1  --   --   --   --   --   HGB 14.0 12.1* 11.6* 11.6* 11.1*  12.0*  HCT 44.8 38.6* 36.5* 36.5* 35.2* 37.4*  MCV 100.7* 97.7 97.6 96.8 96.4 96.1  PLT 192 100* 111* 110* 95* 110*   Cardiac Enzymes:  Recent Labs Lab 08/06/13 1219 08/06/13 1935 08/07/13 0144 08/07/13 0815  TROPONINI 0.94* 1.31* 1.19* 0.89*   BNP: No components found with this basename: POCBNP,  CBG:  Recent Labs Lab 08/09/13 1321 08/09/13 1639 08/09/13 2210 08/10/13 0603 08/10/13 1116  GLUCAP 65* 172* 111* 94 109*    Recent Results (from the past 240 hour(s))  MRSA PCR SCREENING     Status: None   Collection Time    08/01/13  5:41 PM      Result Value Ref Range Status   MRSA by PCR NEGATIVE  NEGATIVE Final   Comment:  The GeneXpert MRSA Assay (FDA     approved for NASAL specimens     only), is one component of a     comprehensive MRSA colonization     surveillance program. It is not     intended to diagnose MRSA     infection nor to guide or     monitor treatment for     MRSA infections.  CULTURE, BLOOD (ROUTINE X 2)     Status: None   Collection Time    08/06/13  5:55 AM      Result Value Ref Range Status   Specimen Description BLOOD FOREARM RIGHT   Final   Special Requests BOTTLES DRAWN AEROBIC AND ANAEROBIC   Final   Culture  Setup Time     Final   Value: 08/06/2013 13:29     Performed at [facility]   Culture     Final   Value:        BLOOD CULTURE RECEIVED NO GROWTH TO DATE CULTURE WILL BE HELD FOR 5 DAYS BEFORE ISSUING A FINAL NEGATIVE REPORT     Performed at [facility]   Report Status PENDING   Incomplete  CULTURE, BLOOD (ROUTINE X 2)     Status: None   Collection Time    08/06/13  6:30 AM      Result Value Ref Range Status   Specimen Description BLOOD RIGHT FOREARM   Final   Special Requests BOTTLES DRAWN AEROBIC AND ANAEROBIC 6CC   Final   Culture  Setup Time     Final   Value: 08/06/2013 17:59     Performed at [facility]   Culture     Final   Value:        BLOOD CULTURE RECEIVED NO GROWTH TO  DATE CULTURE WILL BE HELD FOR 5 DAYS BEFORE ISSUING A FINAL NEGATIVE REPORT     Performed at [facility]   Report Status PENDING   Incomplete  MRSA PCR SCREENING     Status: None   Collection Time    08/06/13 10:42 AM      Result Value Ref Range Status   MRSA by PCR NEGATIVE  NEGATIVE Final   Comment:            The GeneXpert MRSA Assay (FDA     approved for NASAL specimens     only), is one component of a     comprehensive MRSA colonization     surveillance program. It is not     intended to diagnose MRSA     infection nor to guide or     monitor treatment for     MRSA infections.  RESPIRATORY VIRUS PANEL     Status: Abnormal   Collection Time    08/06/13 11:20 AM      Result Value Ref Range Status   Source - RVPAN NASOPHARYNGEAL   Final   Respiratory Syncytial Virus A NOT DETECTED   Final   Respiratory Syncytial Virus B NOT DETECTED   Final   Influenza A DETECTED (*)  Final   Influenza B NOT DETECTED   Final   Parainfluenza 1 NOT DETECTED   Final   Parainfluenza 2 NOT DETECTED   Final   Parainfluenza 3 NOT DETECTED   Final   Metapneumovirus NOT DETECTED   Final   Rhinovirus NOT DETECTED   Final   Adenovirus NOT DETECTED   Final   Influenza A H1 NOT DETECTED  Final   Influenza A H3 DETECTED (*)  Final   Comment: (NOTE)           Normal Reference Range for each Analyte: NOT DETECTED     Testing performed using the Luminex xTAG Respiratory Viral Panel test     kit.     This test was developed and its performance characteristics determined     by [facility]. It has not been cleared or approved by the [place]     Food and Drug Administration. This test is used for clinical purposes.     It should not be regarded as investigational or for research. This     laboratory is certified under the [place] (CLIA) as qualified to perform high complexity     clinical laboratory testing.     Performed at [facility]     Status: None   Collection Time    08/07/13  6:55 PM      Result Value Ref Range Status   Specimen Description URINE, RANDOM   Final   Special Requests NONE   Final   Culture  Setup Time     Final   Value: 08/07/2013 20:25     Performed at [facility]     Final   Value: NO GROWTH     Performed at [facility]   Culture     Final   Value: NO GROWTH     Performed at [facility]   Report Status 08/08/2013 FINAL   Final     Scheduled Meds: . [START ON 08/11/2013] cefTAZidime (FORTAZ)  IV  2 g Intravenous Q M,W,F-HD  . doxercalciferol  5 mcg Intravenous Q M,W,F-HD  . heparin  5,000 Units Subcutaneous 3 times per day  . insulin aspart  0-5 Units Subcutaneous QHS  . insulin aspart  0-9 Units Subcutaneous TID WC  . lip balm  1 application Topical BID  . metoprolol tartrate  12.5 mg Oral BID  . multivitamin  1 tablet Oral QHS  . oseltamivir  30 mg Oral Daily  . pantoprazole  40 mg Oral Daily  . polyethylene glycol  17 g Oral BID  . regadenoson  0.4 mg Intravenous Once  . saccharomyces boulardii  250 mg Oral BID  . [START ON 08/11/2013] vancomycin  750 mg Intravenous Q M,W,F-HD   Continuous Infusions: . diltiazem (CARDIZEM) infusion 15 mg/hr (08/10/13 1217)     Tashonda Pinkus, DO  [facility] Pager [phone]  If 7PM-7AM, please contact night-coverage [email or web address] Password [email or web address] 08/10/2013, 1:38 PM   LOS: 4 days

## 2013-08-10 NOTE — Progress Notes (Signed)
Patient: Victor Lane [name] / Admit Date: 08/06/2013 / Date of Encounter: 08/10/2013, 10:24 AM   Subjective  Went into rapid AFL yesterday, placed on dilt drip by IM and home oral metoprolol resumed. This morning, dilt drip was titrated up to 15 and he converted back to NSR/ST. He denies any current CP or SOB. Denies any unusual bleeding except for one episode of hematuria/rectal bleeding right after PD cath placement but otherwise none before and none after.  Objective   Telemetry: atrial flutter - 2:1 yesterday, now rates 100-110s  Physical Exam: Blood pressure 116/68, pulse 114, temperature 97.4 F (36.3 C), temperature source Oral, resp. rate 18, height 5\' 10"  (1.778 m), weight 166 lb 10.7 oz (75.6 kg), SpO2 90.00%. General: Well developed, well nourished WM in no acute distress. Head: Normocephalic, atraumatic, sclera non-icteric, no xanthomas, nares are without discharge. Neck: Negative for carotid bruits. JVP not elevated. Lungs: Clear bilaterally to auscultation without wheezes, rales, or rhonchi. Breathing is unlabored. Heart: RRR S1 S2 without murmurs, rubs, or gallops.  Abdomen: Soft, non-tender, non-distended  Extremities: No clubbing or cyanosis. No edema. Distal pedal pulses are 2+ and equal bilaterally. Neuro: Alert and oriented X 3. Moves all extremities spontaneously. Psych:  Responds to questions appropriately with a normal affect.  2D Echo 08/07/13 - Left ventricle: The cavity size was normal. There was mild concentric hypertrophy. Systolic function was mildly to moderately reduced. The estimated ejection fraction was in the range of 40% to 45%. Wall motion was normal; there were no regional wall motion abnormalities. Features are consistent with a pseudonormal left ventricular filling pattern, with concomitant abnormal relaxation and increased filling pressure (grade 2 diastolic dysfunction). Doppler parameters are consistent with highly elevated ventricular  end-diastolic filling pressure. - Aortic valve: Trileaflet; moderately thickened, moderately calcified leaflets. Cusp separation was moderately reduced. Transvalvular velocity was within the normal range. There was no stenosis. Moderate regurgitation. - Aortic root: The aortic root was normal in size. - Mitral valve: Calcified annulus. Mildly thickened leaflets . Mild regurgitation. - Left atrium: The atrium was moderately dilated. - Right ventricle: The cavity size was moderately dilated. Wall thickness was normal. Systolic function was normal. - Tricuspid valve: Moderate regurgitation. - Pulmonic valve: No regurgitation. - Pulmonary arteries: Systolic pressure was within the normal range. PA peak pressure: 68mm Hg (S). - Pericardium, extracardiac: There is a large left pleural effusion. There was no pericardial effusion.    Intake/Output Summary (Last 24 hours) at 08/10/13 1024 Last data filed at 08/10/13 0906  Gross per 24 hour  Intake   1060 ml  Output   2961 ml  Net  -1901 ml    Inpatient Medications:  . [START ON 08/11/2013] cefTAZidime (FORTAZ)  IV  2 g Intravenous Q M,W,F-HD  . doxercalciferol  5 mcg Intravenous Q M,W,F-HD  . heparin  5,000 Units Subcutaneous 3 times per day  . insulin aspart  0-5 Units Subcutaneous QHS  . insulin aspart  0-9 Units Subcutaneous TID WC  . lip balm  1 application Topical BID  . metoprolol tartrate  12.5 mg Oral BID  . multivitamin  1 tablet Oral QHS  . oseltamivir  30 mg Oral Daily  . pantoprazole  40 mg Oral Daily  . polyethylene glycol  17 g Oral BID  . regadenoson  0.4 mg Intravenous Once  . saccharomyces boulardii  250 mg Oral BID  . [START ON 08/11/2013] vancomycin  750 mg Intravenous Q M,W,F-HD   Infusions:  . diltiazem (CARDIZEM) infusion 15  mg/hr (08/10/13 0742)    Labs:  Recent Labs  08/08/13 1504 08/09/13 0813 08/10/13 0840  NA 137 132* 129*  K 4.0 5.9* 3.4*  CL 95* 94* 91*  CO2 26 26 24   GLUCOSE 120* 98 232*    BUN 25* 20 18  CREATININE 3.94* 3.55* 3.10*  CALCIUM 8.6 8.4 8.0*  PHOS 3.3 3.8  --     Recent Labs  08/08/13 1504 08/09/13 0813  ALBUMIN 2.5* 2.4*    Recent Labs  08/09/13 0857 08/10/13 0840  WBC 2.4* 3.5*  HGB 11.1* 12.0*  HCT 35.2* 37.4*  MCV 96.4 96.1  PLT 95* 110*   Radiology/Studies:  Ct Abdomen Pelvis Wo Contrast  08/06/2013   CLINICAL DATA:  Abdominal pain and fever. Recent surgical procedure for peritoneal dialysis 5 days ago.  EXAM: CT ABDOMEN AND PELVIS WITHOUT CONTRAST  TECHNIQUE: Multidetector CT imaging of the abdomen and pelvis was performed following the standard protocol without intravenous contrast.  COMPARISON:  None.  FINDINGS: Lung bases demonstrate a moderate size right pleural effusion and small left pleural effusion with associated airspace opacification which may be due to atelectasis or infection. There is mild cardiomegaly.  Abdominal images demonstrate evidence of a prior cholecystectomy. There is a small amount of perihepatic fluid. There is mild fatty infiltration of the liver with subtle nodular contour. The pancreas and adrenal glands are unremarkable. Appendix is within normal. Kidneys are normal in size with bilateral renal cortical thinning. There is a 6 cm exophytic simple cyst over the mid pole of the left kidney. There are a few small of bilateral hypodensities likely cysts. There is a 1.1 cm homogeneously hyperdense mass over the lower pole of the left kidney with Hounsfield unit measurements 45 likely a hemorrhagic cyst. Ureters are within normal. There is no hydronephrosis or nephrolithiasis. There is diverticulosis of the colon most prominent over the sigmoid colon. There is a peritoneal dialysis catheter entering the left mid abdomen with tip coiled over the pelvis between loops of rectosigmoid colon. There is moderate calcified plaque of the thoracoabdominal aorta and iliac arteries. Minimal free fluid over the right pericolic gutter. No evidence  of free peritoneal air.  Remaining pelvic structures are notable only for moderate fecal retention over the rectosigmoid colon. There is mild degenerative change of the spine and hips.  IMPRESSION: Moderate size right pleural effusion and small left pleural effusion with associated airspace opacification in the lower lobes which may be due to pneumonia or atelectasis.  Peritoneal dialysis catheter with tip coiled over the pelvis between several recto sigmoid colonic loops.  Changes involving the liver which may be due to cirrhosis. Small amount of ascites.  Bilateral renal cortical thinning and bilateral renal cysts. 1.1 cm homogeneously hyperdense left renal mass likely a hemorrhagic cyst. Recommend followup CT 6 months.  Diverticulosis of the colon. Moderate fecal retention over the rectosigmoid colon.  Small hiatal hernia.   Electronically Signed   By: Marin Olp M.D.   On: 08/06/2013 07:57   Dg Chest Port 1 View  08/07/2013   CLINICAL DATA:  Shortness of breath, pneumonia  EXAM: PORTABLE CHEST - 1 VIEW  COMPARISON:  DG CHEST 1V PORT dated 08/06/2013; DG CHEST 1V PORT dated 08/02/2013; DG CHEST 2 VIEW dated 04/25/2013; DG CHEST 1V PORT dated 02/16/2013  FINDINGS: Grossly unchanged enlarged cardiac silhouette and mediastinal contours with atherosclerotic plaque within the thoracic aorta. The pulmonary vasculature is indistinct with cephalization of flow. Interval increase in now small layering bilateral  effusions with associated worsening bibasilar opacities, left greater than right. No definite evidence of edema. Grossly unchanged bones including presumed resection of the distal end of the right clavicle.  IMPRESSION: Worsening pulmonary edema, small layering bilateral effusions and associated bibasilar opacities, left greater than right, atelectasis versus infiltrate.   Electronically Signed   By: Sandi Mariscal M.D.   On: 08/07/2013 07:36   Dg Chest Port 1 View  08/06/2013   CLINICAL DATA:  68 year old male  with shortness of breath, fever abdominal pain cyanosis. Initial encounter.  EXAM: PORTABLE CHEST - 1 VIEW  COMPARISON:  08/02/2013 and earlier.  FINDINGS: Portable AP semi upright view at 0612 hrs.  Extubated. Decreased bilateral veiling pulmonary opacity. Improved retrocardiac ventilation. Stable cardiac size and mediastinal contours. No pneumothorax. Stable pulmonary vascularity/interstitial opacity.  IMPRESSION: 1. Extubated. 2. Decreased appearance of bilateral pleural effusions and improved bibasilar ventilation. 3. Continued  pulmonary vascular congestion/interstitial edema.   Electronically Signed   By: Lars Pinks M.D.   On: 08/06/2013 06:31   Dg Chest Port 1 View  08/02/2013   CLINICAL DATA:  Respiratory failure.  EXAM: PORTABLE CHEST - 1 VIEW  COMPARISON:  DG CHEST 1V PORT dated 08/01/2013  FINDINGS: Endotracheal tube is approximately 3 cm above the carina. Degree of pulmonary edema at may be mildly improved. There are likely bilateral pleural effusions. The heart remains moderately enlarged.  IMPRESSION: Mild decrease in pulmonary edema.   Electronically Signed   By: Aletta Edouard M.D.   On: 08/02/2013 07:40   Dg Chest Port 1 View  08/01/2013   CLINICAL DATA:  Status post re-intubation  EXAM: PORTABLE CHEST - 1 VIEW  COMPARISON:  05/22/2013  FINDINGS: An endotracheal tube is seen 3.5 cm above the carinal. The cardiac shadow is prominent. Vascular congestion with mild posteriorly layering effusions is identified. A temporary dialysis catheter is not seen on this film. Correlation with the clinical findings is recommended.  IMPRESSION: Vascular congestion and effusions which may be related to volume overload.  Endotracheal tube as described.   Electronically Signed   By: Inez Catalina M.D.   On: 08/01/2013 16:22     Assessment and Plan  1. ESRD on HD MWF with PD catheter peritonitis and severe sepsis, still febrile to 100.3 on 2/18 2. Influenza A, on tamiflu 3. Acute respiratory failure,  improving 4. NSTEMI felt due to demand ischemia, peak troponin 1.31 5. Acute on chronic CHF with newly recognized systolic dysfunction EF [id number] by echo 2/16 6. Atrial flutter, newly recognized on 2/18 - spontaneously converted to NSR on 2/19 7. Valvular heart disease - moderate AI, mild MR, mod TR by echo 2/16 8. Hyponatremia, hypokalemia, hypocalcemia - lytes per renal/IM 9. Anemia/thrombocytopenia/leukopenia - per IM - Hgb variable from 11-16 over February 10. Glottic squamous cell carcinoma s/p radiation 11.  Severe protein-calorie malnutrition 12. COPD on nocturnal O2, h/o trache placement 13. Renal mass by CT, possibly hemorrhagic cyst, recommend f/u CT 6 months per radiology note - will defer to IM  Spont converted to NSR on dilt drip. Diltiazem drip less ideal given his EF, but has been on low doses only of metoprolol as outpatient. Suspect initiated by underlying illness although CHADSVASC = 5. Has not been on aspirin as inpatient. Was only on DVT ppx heparin and HIT panel ordered by pharmacy this AM. Will discuss management of atrial flutter/anticoag with MD. Would probably pursue optimization of rate control agents before adding on nitrates/hydralazine for CHF. Ordered EKG for this AM.  Per Dr. Lurline Del rounding note 2/17 - "Overall impression remains that his very mild cardiac troponin elevation was a consequence of sepsis, rather than true acute coronary event. The reduction in LVEF may reflect cardiomyopathy of critical illness, but on the other hand, sepsis may have unmasked underlying chronic coronary insufficiency. He has multiple risk factors including DM and has established PAD. " The patient unfortunately ate this AM even though he was NPO for nuclear stress test. This has been reordered for tomorrow but presumably HR will need to be improved before proceeding.   Signed, Melina Copa PA-C  I have seen and examined the patient along with Melina Copa PA-C.  I have reviewed the chart,  notes and new data.  I agree with PA's note.  PLAN: Agree, may have been premature to order nuclear study and it is not urgent. Will reschedule.  Sanda Klein, MD, [place] [phone] 08/10/2013, 10:48 AM

## 2013-08-11 ENCOUNTER — Inpatient Hospital Stay (HOSPITAL_COMMUNITY): Payer: Medicare Other

## 2013-08-11 DIAGNOSIS — I509 Heart failure, unspecified: Secondary | ICD-10-CM

## 2013-08-11 DIAGNOSIS — D696 Thrombocytopenia, unspecified: Secondary | ICD-10-CM

## 2013-08-11 LAB — RENAL FUNCTION PANEL
ALBUMIN: 2.3 g/dL — AB (ref 3.5–5.2)
BUN: 34 mg/dL — ABNORMAL HIGH (ref 6–23)
CHLORIDE: 92 meq/L — AB (ref 96–112)
CO2: 23 meq/L (ref 19–32)
CREATININE: 4.75 mg/dL — AB (ref 0.50–1.35)
Calcium: 8.4 mg/dL (ref 8.4–10.5)
GFR calc Af Amer: 13 mL/min — ABNORMAL LOW (ref >90)
GFR calc non Af Amer: 12 mL/min — ABNORMAL LOW (ref >90)
Glucose, Bld: 103 mg/dL — ABNORMAL HIGH (ref 70–99)
Phosphorus: 3.2 mg/dL (ref 2.3–4.6)
Potassium: 3.7 mEq/L (ref 3.7–5.3)
Sodium: 133 mEq/L — ABNORMAL LOW (ref 137–147)

## 2013-08-11 LAB — CBC
HCT: 38.8 % — ABNORMAL LOW (ref 39.0–52.0)
Hemoglobin: 12.4 g/dL — ABNORMAL LOW (ref 13.0–17.0)
MCH: 30.3 pg (ref 26.0–34.0)
MCHC: 32 g/dL (ref 30.0–36.0)
MCV: 94.9 fL (ref 78.0–100.0)
Platelets: 122 10*3/uL — ABNORMAL LOW (ref 150–400)
RBC: 4.09 MIL/uL — AB (ref 4.22–5.81)
RDW: 18.9 % — ABNORMAL HIGH (ref 11.5–15.5)
WBC: 5 10*3/uL (ref 4.0–10.5)

## 2013-08-11 LAB — GLUCOSE, CAPILLARY
GLUCOSE-CAPILLARY: 79 mg/dL (ref 70–99)
GLUCOSE-CAPILLARY: 113 mg/dL — AB (ref 70–99)
Glucose-Capillary: 84 mg/dL (ref 70–99)
Glucose-Capillary: 77 mg/dL (ref 70–99)

## 2013-08-11 LAB — HEMOGLOBIN A1C
Hgb A1c MFr Bld: 6.3 % — ABNORMAL HIGH (ref <5.7)
MEAN PLASMA GLUCOSE: 134 mg/dL — AB (ref <117)

## 2013-08-11 LAB — FIBRINOGEN: FIBRINOGEN: 472 mg/dL (ref 204–475)

## 2013-08-11 LAB — PROTIME-INR
INR: 1.06 (ref 0.00–1.49)
Prothrombin Time: 13.6 seconds (ref 11.6–15.2)

## 2013-08-11 LAB — APTT: aPTT: 38 seconds — ABNORMAL HIGH (ref 24–37)

## 2013-08-11 MED ORDER — TECHNETIUM TC 99M SESTAMIBI GENERIC - CARDIOLITE
10.0000 | Freq: Once | INTRAVENOUS | Status: AC | PRN
  Administered 2013-08-11: 10 via INTRAVENOUS

## 2013-08-11 MED ORDER — DOXERCALCIFEROL 4 MCG/2ML IV SOLN
INTRAVENOUS | Status: AC
  Administered 2013-08-11: 5 ug via INTRAVENOUS
  Filled 2013-08-11: qty 4

## 2013-08-11 MED ORDER — REGADENOSON 0.4 MG/5ML IV SOLN
INTRAVENOUS | Status: AC
  Filled 2013-08-11: qty 5

## 2013-08-11 NOTE — Progress Notes (Signed)
Patient Name: Victor Lane Date of Encounter: 08/11/2013  Principal Problem:   Severe sepsis(995.92) Active Problems:   Glottis carcinoma   Acute respiratory failure   Diabetes mellitus   HTN (hypertension)   ESRD (end stage renal disease) on dialysis   Anemia of chronic disease   Protein-calorie malnutrition, severe   Other malaise and fatigue   COPD GOLD II   Peritonitis   HCAP (healthcare-associated pneumonia)   CAPD (continuous ambulatory peritoneal dialysis) catheter in place   Elevated troponin level   Atrial flutter   Valvular heart disease - moderate AI, moderate TR, mild MR by echo 08/07/13   Acute on chronic CHF with newly recognized systolic dysfunction EF 23-30%   Thrombocytopenia   Leukopenia   Length of Stay: 5  SUBJECTIVE  Feels well, lays flat in bed, but hypoxemic. O2 sat better sitting up  CURRENT MEDS . cefTAZidime (FORTAZ)  IV  2 g Intravenous Q M,W,F-HD  . doxercalciferol  5 mcg Intravenous Q M,W,F-HD  . heparin  5,000 Units Subcutaneous 3 times per day  . insulin aspart  0-5 Units Subcutaneous QHS  . insulin aspart  0-9 Units Subcutaneous TID WC  . lip balm  1 application Topical BID  . metoprolol tartrate  12.5 mg Oral BID  . multivitamin  1 tablet Oral QHS  . oseltamivir  30 mg Oral Daily  . pantoprazole  40 mg Oral Daily  . polyethylene glycol  17 g Oral BID  . regadenoson  0.4 mg Intravenous Once  . saccharomyces boulardii  250 mg Oral BID  . vancomycin  750 mg Intravenous Q M,W,F-HD    OBJECTIVE   Intake/Output Summary (Last 24 hours) at 08/11/13 0824 Last data filed at 08/10/13 1700  Gross per 24 hour  Intake    360 ml  Output    151 ml  Net    209 ml   Filed Weights   08/09/13 0705 08/09/13 1205 08/11/13 0522  Weight: 79 kg (174 lb 2.6 oz) 75.6 kg (166 lb 10.7 oz) 76.7 kg (169 lb 1.5 oz)    PHYSICAL EXAM Filed Vitals:   08/11/13 0522 08/11/13 0750 08/11/13 0802 08/11/13 0806  BP: 125/68  125/68   Pulse: 84 85 84    Temp: 97.6 F (36.4 C)     TempSrc: Oral     Resp: 18 24    Height:      Weight: 76.7 kg (169 lb 1.5 oz)     SpO2: 91% 87% 90% 94%   General: Alert, oriented x3, no distress Head: no evidence of trauma, PERRL, EOMI, no exophtalmos or lid lag, no myxedema, no xanthelasma; normal ears, nose and oropharynx Neck: normal jugular venous pulsations and no hepatojugular reflux; brisk carotid pulses without delay and no carotid bruits Chest: reduced breath sounds and a few rales in both bases Cardiovascular: normal position and quality of the apical impulse, regular rhythm, normal first and second heart sounds, no rubs or gallops, no murmur Abdomen: no tenderness or distention, no masses by palpation, no abnormal pulsatility or arterial bruits, normal bowel sounds, no hepatosplenomegaly Extremities: no clubbing, cyanosis or edema; 2+ radial, ulnar and brachial pulses bilaterally; 2+ right femoral, posterior tibial and dorsalis pedis pulses; 2+ left femoral, posterior tibial and dorsalis pedis pulses; no subclavian or femoral bruits Neurological: grossly nonfocal  LABS  CBC  Recent Labs  08/10/13 0840 08/11/13 0235  WBC 3.5* 5.0  HGB 12.0* 12.4*  HCT 37.4* 38.8*  MCV 96.1 94.9  PLT 110* 580*   Basic Metabolic Panel  Recent Labs  08/08/13 1504 08/09/13 0813 08/10/13 0840  NA 137 132* 129*  K 4.0 5.9* 3.4*  CL 95* 94* 91*  CO2 26 26 24   GLUCOSE 120* 98 232*  BUN 25* 20 18  CREATININE 3.94* 3.55* 3.10*  CALCIUM 8.6 8.4 8.0*  PHOS 3.3 3.8  --    Liver Function Tests  Recent Labs  08/08/13 1504 08/09/13 0813  ALBUMIN 2.5* 2.4*    Radiology Studies Imaging results have been reviewed and No results found.  TELE NSR, occ PACs, no further sustained atrial arrhythmia  ECG NSR with PACs, nonspecific repol changes  ASSESSMENT AND PLAN Plan nuclear myocardial perfusion study to assess for extent of coronary insufficiency in view of recent NSTEMI (by troponin elevation)  and new reduction in LVEF. (in setting of influenza and sepsis). Sill hypoxemic, likely still fluid overloaded - may need additional dialysis.   Sanda Klein, MD, [facility] [phone] office [phone] pager 08/11/2013 8:24 AM

## 2013-08-11 NOTE — Progress Notes (Signed)
Subjective:   Has been better, SOB, doesnt like wearing the mask. Resting on HD  Objective Filed Vitals:   08/11/13 1205 08/11/13 1216 08/11/13 1218 08/11/13 1247  BP: 139/73 141/76 137/77 141/73  Pulse: 86 88 87 86  Temp: 97 F (36.1 C)     TempSrc: Oral     Resp: 21 21 15 23   Height:      Weight: 77.5 kg (170 lb 13.7 oz)     SpO2: 91%      Physical Exam General: alert and oriented. No acute distress Heart: RRR, no murmur, rub or gallop Lungs: exp wheezes. Crackles bilat bases. Tachypnea, slightly labored Abdomen: soft, nontender. +BS Extremities: Trace LE edema. Much improved. Dialysis Access:  LUA AVF   Dialysis Orders: MWF @ [facility]  4 hrs 79 kg 400/A1.5 2K/2.25Ca Heparin 2400 U AVF @ LUA  Hectorol 5 mcg Epogen 0 Venofer 0   Assessment/Plan:  1. Abdominal pain/Peritonitis - Afebrile. PD catheter placed 2/10, purulent drainage on admission but dressing currently clean and dry and ? PD catheter displacement; IV Aztreonam, Flagyl & Vancomycin started. Surgery has seen. External tubing replaced sterilely. Plans replacement of PD cath in OR if peritonitis does not resolve.  2. CHF- nuclear stress test cancelled today. Per cardiology 3. Hyperkalemia - resolved with HD. K+3.4  4. ESRD - MWF, HD today goal 4 L. olc green HD for volume excess tomorrow. 5. HyPOtension/volume - 141/73 hypotension resolving.Home Metoprolol 12.5 mg bid ; Reaching EDW but still with vol xs on exam. Chest xray- pulm edema/bilat effusions. Needs lower edw. 6. Anemia - Hgb 12.4, last T-sat 38% with ferritin 1936; no outpatient Epogen or Fe. Watch CBC 7. Metabolic bone disease - Ca 8  P 3.8; Hectorol 5 mcg, Hold Renvela 3 pending resolution of #1  8. Nutrition - Alb 2.4 encourage protein, Renal/carb mod diet, nepro. multivitamin. 9. Hx glottis carcinoma - s/p radiation in 8-02/2013. 10. 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    Shelle Iron, NP [facility]  [phone] 08/11/2013,12:52 PM  LOS: 5 days    Additional Objective Labs: Basic Metabolic Panel:  Recent Labs Lab 08/08/13 1504 08/09/13 0813 08/10/13 0840  NA 137 132* 129*  K 4.0 5.9* 3.4*  CL 95* 94* 91*  CO2 26 26 24   GLUCOSE 120* 98 232*  BUN 25* 20 18  CREATININE 3.94* 3.55* 3.10*  CALCIUM 8.6 8.4 8.0*  PHOS 3.3 3.8  --    Liver Function Tests:  Recent Labs Lab 08/06/13 0555 08/08/13 1504 08/09/13 0813  AST 81*  --   --   ALT 31  --   --   ALKPHOS 128*  --   --   BILITOT 1.1  --   --   PROT 7.3  --   --   ALBUMIN 3.2* 2.5* 2.4*    Recent Labs Lab 08/06/13 1219  LIPASE 34  AMYLASE 1053*   CBC:  Recent Labs Lab 08/06/13 0555  08/07/13 0144 08/08/13 1504 08/09/13 0857 08/10/13 0840 08/11/13 0235  WBC 5.9  < > 10.3 3.7* 2.4* 3.5* 5.0  NEUTROABS 4.1  --   --   --   --   --   --   HGB 14.0  < > 11.6* 11.6* 11.1* 12.0* 12.4*  HCT 44.8  < > 36.5* 36.5* 35.2* 37.4* 38.8*  MCV 100.7*  < > 97.6 96.8 96.4 96.1 94.9  PLT 192  < > 111* 110* 95* 110* 122*  < > =  values in this interval not displayed. Blood Culture    Component Value Date/Time   SDES URINE, RANDOM 08/07/2013 1855   SDES URINE, RANDOM 08/07/2013 1855   SPECREQUEST NONE 08/07/2013 1855   SPECREQUEST NONE 08/07/2013 1855   CULT  Value: NO GROWTH Performed at [facility] 08/07/2013 1855   REPTSTATUS 08/08/2013 FINAL 08/07/2013 1855   REPTSTATUS 08/08/2013 FINAL 08/07/2013 1855    Cardiac Enzymes:  Recent Labs Lab 08/06/13 1219 08/06/13 1935 08/07/13 0144 08/07/13 0815  TROPONINI 0.94* 1.31* 1.19* 0.89*   CBG:  Recent Labs Lab 08/10/13 1116 08/10/13 1635 08/10/13 2134 08/11/13 0657 08/11/13 1100  GLUCAP 109* 125* 114* 84 77   Iron Studies: No results found for this basename: IRON, TIBC, TRANSFERRIN, FERRITIN,  in the last 72 hours @lablastinr3 @ Studies/Results: Dg Chest 2 View  08/11/2013   CLINICAL DATA:  Pulmonary edema.  EXAM: CHEST  2 VIEW  COMPARISON:  August 07, 2013.  FINDINGS: Stable cardiomegaly. Continued presence of interstitial densities are noted throughout both lungs most consistent with pulmonary edema or less likely pneumonia which is not significantly changed. Mild bilateral pleural effusions are noted. No pneumothorax is noted. Bony thorax is intact.  IMPRESSION: Stable bilateral lung opacities are noted most consistent with pulmonary edema, although pneumonia cannot be excluded. Mild bilateral pleural effusions are noted.   Electronically Signed   By: Sabino Dick M.D.   On: 08/11/2013 10:46   Nm Myocar Single W/spect W/wall Motion And Ef  08/11/2013   CLINICAL DATA:  Chest pain  EXAM: MYOCARDIAL IMAGING WITH SPECT (REST)  TECHNIQUE: Standard myocardial SPECT imaging was performed after resting intravenous injection of 10 mCi Tc-69m sestamibi. Quantitative gated imaging was also performed to evaluate left ventricular wall motion, and estimate left ventricular ejection fraction.  COMPARISON:  None.  FINDINGS: SPECT imaging without gating was performed. There is moderately diminished activity throughout the inferior wall. There is also a perfusion defect at the apex and a final focal mid anteroseptal perfusion defect.  IMPRESSION: Rest imaging only without gating was performed. There is thinning of the inferior wall. There are focal defects at the apex and mid anteroseptal regions.   Electronically Signed   By: Maryclare Bean M.D.   On: 08/11/2013 11:22   Medications:   . cefTAZidime (FORTAZ)  IV  2 g Intravenous Q M,W,F-HD  . doxercalciferol  5 mcg Intravenous Q M,W,F-HD  . heparin  5,000 Units Subcutaneous 3 times per day  . insulin aspart  0-5 Units Subcutaneous QHS  . insulin aspart  0-9 Units Subcutaneous TID WC  . lip balm  1 application Topical BID  . metoprolol tartrate  12.5 mg Oral BID  . multivitamin  1 tablet Oral QHS  . oseltamivir  30 mg Oral Daily  . pantoprazole  40 mg Oral Daily  . polyethylene glycol  17 g Oral BID  .  regadenoson      . saccharomyces boulardii  250 mg Oral BID  . vancomycin  750 mg Intravenous Q M,W,F-HD

## 2013-08-11 NOTE — Progress Notes (Signed)
Pt wheezing, SOB, lethargic in NUC med. Discussed with Dr Sallyanne Kuster- cancel Lexiscan today.  Kerin Ransom PA-C 08/11/2013 10:34 AM

## 2013-08-11 NOTE — Progress Notes (Signed)
As the patient was more somnolent today, LexiScan was canceled. When I examined the patient early this morning, he was sleeping, but easily arousable and answered questions appropriately once awake.  The case was discussed with cardiology, Dr. Croitoru--d/c cardizem drip.  Chest x-ray showed stable bilateral interstitial infiltrates with small bilateral effusions consistent with fluid overload. Dialysis is scheduled for today. Nephrology is aware. -Discontinue Ambien

## 2013-08-11 NOTE — Progress Notes (Signed)
Patient arrived in stress lab post rest images. On telemetry. Patient has SPO2 90-92% on 4L [place]. Audible wheezes and crackles heard. Resps 20 and somewhat laboured. On IV Cardiazem. In sinus rhythm. Patient mainly sleeping and confused at times. Looks tired and  ill. Wearing a mask. On droplet precautions. Denies pain. BP stable. Victor Done PA and Angelica Ran PA aware of above findings. Had resting images. Stress images/ Lexiscan portion cancelled per L Kilroy PA after he saw patient.. Taken back to 2W09 with Wyvonne Lenz RN and EKG tech. Patient is on telemetry. Report given to 2W RN. She is aware to cancel stress portion of test as per L Lasker PA. Lexiscan med discontinued.

## 2013-08-11 NOTE — Progress Notes (Signed)
Upon my initial assessment this am, pt awakened, lying flat in bed. Lungs with coarse crackles throughout and pt with frequent NP cough. O2 sat 87% on 3L, RR 24. Pt denies feeling shob. O2 increased to 4L and pt assisted to sit up on side of bed. After 10 minutes, crackles only in bases, good air movement. O2 sat 94%.

## 2013-08-11 NOTE — Procedures (Signed)
Tolerating HD treatment. CXR with pulmonary edema.  Exam wheezes L>R. Will try to remove excees fluid if tolerated . Victor Lane

## 2013-08-11 NOTE — Progress Notes (Signed)
[facility] PROGRESS NOTE  Victor Lane KNL:[id number] DOB: 07-27-1945 DOA: 08/06/2013 PCP: [name], MD  Assessment/Plan: Acute on chronic respiratory failure  -Patient remains hypoxemic with oxygen saturation in the low 90s-->increased nasal cannula to 4 L -Likely remains fluid overloaded--> nephrology following for dialysis -secondary to Bilateral pleural effusions/fluid overload -Repeat chest x-ray today -COPD on nocturnal O2 w/out exacerbation  -2/16 pulm edema on x ray  -Supplemental oxygen  -Albuterol / Ipratropium prn -HD  Acute on chronic systolic CHF  -EF 79-02% on 08/07/13 echo  -Nuclear stress test scheduled for today Paroxysmal atrial flutter  -Developed on the evening of 08/09/2013  -Spontaneous conversion to sinus rhythm after Cardizem drip  -Given the patient's EF, defer chronotropic agents to cardiology  -CHADSVASc= 4  -defer anticoagulation decision to cardiology  -Defer chronotropic agent management to cardiology Elevated troponin:  -Most likely from demand ischemia in the setting of ESRD  -ischemic workup once current illness resolves  -Echocardiogram EF 40-97%, grade 2 diastolic dysfunction, no WMA  -trop (1.31 max) trending down today  Hold ASA, Lopressor  Echo: EF 35% with diasolic dfxn  Appreciate cardiology followup   Severe sepsis  -improved  -Continue antibiotics  -continue IV vanco  -d/c aztreonam and Flagyl  -start ceftazidime 2 grams with HD  ESRD on HD - MWF  -Try to maintain CAPD catheter as long as the patient clinically improves  -HD per renal  -Trend BMP  Peritonitis  -S/p PD cath placement 2/10: Flushing well, healing well externally (? Extension tubing pulled off at home)  -Clinically improving without any further abdominal pain -Continue vancomycin, ceftazidime  Flu A  -added tamiflu--started 08/08/2013  -Continue oseltamivir through  08/12/2013  Thrombocytopenia/leukopenia  -Platelets appear to be gradually  improving -likely due to influenza although HIT is consideration  -HIT panel--pending -other culprits include meds--protonix, vanco  -continue to trend  DM  -intermitten Hypoglycemia due to glipizide  -SSI  -CBG 69-104; D50 IV as needed for hypoglycemia  -Hold Glipizide  -Await hemoglobin A1c  Lower extremity edema  -Left greater than right  -Venous duplex rule out DVT--negative for DVT  Renal Mass  -Left renal 1.1 cm hyperdensity likely represents hemorrhagic cyst  -Needs a 6 month surveillance CT  Family Communication: wife updated on phone  Disposition Plan: Home when medically stable  Antibiotics:  Vancomycin 08/06/2013>>>  Ceftazidime 08/09/2013>>>  Aztreonam 08/06/2013>>> 08/09/2013  Flagyl 08/06/13>>>08/09/13          Procedures/Studies: Ct Abdomen Pelvis Wo Contrast  08/06/2013   CLINICAL DATA:  Abdominal pain and fever. Recent surgical procedure for peritoneal dialysis 5 days ago.  EXAM: CT ABDOMEN AND PELVIS WITHOUT CONTRAST  TECHNIQUE: Multidetector CT imaging of the abdomen and pelvis was performed following the standard protocol without intravenous contrast.  COMPARISON:  None.  FINDINGS: Lung bases demonstrate a moderate size right pleural effusion and small left pleural effusion with associated airspace opacification which may be due to atelectasis or infection. There is mild cardiomegaly.  Abdominal images demonstrate evidence of a prior cholecystectomy. There is a small amount of perihepatic fluid. There is mild fatty infiltration of the liver with subtle nodular contour. The pancreas and adrenal glands are unremarkable. Appendix is within normal. Kidneys are normal in size with bilateral renal cortical thinning. There is a 6 cm exophytic simple cyst over the mid pole of the left kidney. There are a few small of bilateral hypodensities likely cysts. There is a 1.1 cm homogeneously hyperdense mass over the lower pole  of the left kidney with Hounsfield unit  measurements 45 likely a hemorrhagic cyst. Ureters are within normal. There is no hydronephrosis or nephrolithiasis. There is diverticulosis of the colon most prominent over the sigmoid colon. There is a peritoneal dialysis catheter entering the left mid abdomen with tip coiled over the pelvis between loops of rectosigmoid colon. There is moderate calcified plaque of the thoracoabdominal aorta and iliac arteries. Minimal free fluid over the right pericolic gutter. No evidence of free peritoneal air.  Remaining pelvic structures are notable only for moderate fecal retention over the rectosigmoid colon. There is mild degenerative change of the spine and hips.  IMPRESSION: Moderate size right pleural effusion and small left pleural effusion with associated airspace opacification in the lower lobes which may be due to pneumonia or atelectasis.  Peritoneal dialysis catheter with tip coiled over the pelvis between several recto sigmoid colonic loops.  Changes involving the liver which may be due to cirrhosis. Small amount of ascites.  Bilateral renal cortical thinning and bilateral renal cysts. 1.1 cm homogeneously hyperdense left renal mass likely a hemorrhagic cyst. Recommend followup CT 6 months.  Diverticulosis of the colon. Moderate fecal retention over the rectosigmoid colon.  Small hiatal hernia.   Electronically Signed   By: Marin Olp M.D.   On: 08/06/2013 07:57   Dg Chest Port 1 View  08/07/2013   CLINICAL DATA:  Shortness of breath, pneumonia  EXAM: PORTABLE CHEST - 1 VIEW  COMPARISON:  DG CHEST 1V PORT dated 08/06/2013; DG CHEST 1V PORT dated 08/02/2013; DG CHEST 2 VIEW dated 04/25/2013; DG CHEST 1V PORT dated 02/16/2013  FINDINGS: Grossly unchanged enlarged cardiac silhouette and mediastinal contours with atherosclerotic plaque within the thoracic aorta. The pulmonary vasculature is indistinct with cephalization of flow. Interval increase in now small layering bilateral effusions with associated worsening  bibasilar opacities, left greater than right. No definite evidence of edema. Grossly unchanged bones including presumed resection of the distal end of the right clavicle.  IMPRESSION: Worsening pulmonary edema, small layering bilateral effusions and associated bibasilar opacities, left greater than right, atelectasis versus infiltrate.   Electronically Signed   By: Sandi Mariscal M.D.   On: 08/07/2013 07:36   Dg Chest Port 1 View  08/06/2013   CLINICAL DATA:  68 year old male with shortness of breath, fever abdominal pain cyanosis. Initial encounter.  EXAM: PORTABLE CHEST - 1 VIEW  COMPARISON:  08/02/2013 and earlier.  FINDINGS: Portable AP semi upright view at 0612 hrs.  Extubated. Decreased bilateral veiling pulmonary opacity. Improved retrocardiac ventilation. Stable cardiac size and mediastinal contours. No pneumothorax. Stable pulmonary vascularity/interstitial opacity.  IMPRESSION: 1. Extubated. 2. Decreased appearance of bilateral pleural effusions and improved bibasilar ventilation. 3. Continued  pulmonary vascular congestion/interstitial edema.   Electronically Signed   By: Lars Pinks M.D.   On: 08/06/2013 06:31   Dg Chest Port 1 View  08/02/2013   CLINICAL DATA:  Respiratory failure.  EXAM: PORTABLE CHEST - 1 VIEW  COMPARISON:  DG CHEST 1V PORT dated 08/01/2013  FINDINGS: Endotracheal tube is approximately 3 cm above the carina. Degree of pulmonary edema at may be mildly improved. There are likely bilateral pleural effusions. The heart remains moderately enlarged.  IMPRESSION: Mild decrease in pulmonary edema.   Electronically Signed   By: Aletta Edouard M.D.   On: 08/02/2013 07:40   Dg Chest Port 1 View  08/01/2013   CLINICAL DATA:  Status post re-intubation  EXAM: PORTABLE CHEST - 1 VIEW  COMPARISON:  05/22/2013  FINDINGS: An endotracheal tube is seen 3.5 cm above the carinal. The cardiac shadow is prominent. Vascular congestion with mild posteriorly layering effusions is identified. A temporary  dialysis catheter is not seen on this film. Correlation with the clinical findings is recommended.  IMPRESSION: Vascular congestion and effusions which may be related to volume overload.  Endotracheal tube as described.   Electronically Signed   By: Inez Catalina M.D.   On: 08/01/2013 16:22         Subjective: Patient was sleepy this morning but arouses easily and is alert. Denies any fevers, chills, chest discomfort, shortness breath, nausea, vomiting, diarrhea, abdominal pain, dizziness. No headaches.   Objective: Filed Vitals:   08/11/13 0522 08/11/13 0750 08/11/13 0802 08/11/13 0806  BP: 125/68  125/68   Pulse: 84 85 84   Temp: 97.6 F (36.4 C)     TempSrc: Oral     Resp: 18 24    Height:      Weight: 76.7 kg (169 lb 1.5 oz)     SpO2: 91% 87% 90% 94%    Intake/Output Summary (Last 24 hours) at 08/11/13 0858 Last data filed at 08/10/13 1700  Gross per 24 hour  Intake    360 ml  Output    151 ml  Net    209 ml   Weight change: -2.3 kg (-5 lb 1.1 oz) Exam:   General:  Pt is alert, follows commands appropriately, not in acute distress  HEENT: No icterus, No thrush, [place]/AT  Cardiovascular: RRR, S1/S2, no rubs, no gallops  Respiratory: Bibasilar crackles. Diminished breath sounds right base. No wheezing.   Abdomen: Soft/+BS, non tender, non distended, no guarding  Extremities: No edema, No lymphangitis, No petechiae, No rashes, no synovitis  Data Reviewed: Basic Metabolic Panel:  Recent Labs Lab 08/06/13 1219 08/07/13 0144 08/08/13 1504 08/09/13 0813 08/10/13 0840  NA 137 137 137 132* 129*  K 4.3 3.2* 4.0 5.9* 3.4*  CL 95* 95* 95* 94* 91*  CO2 _0 GLUCOSE 89 103* 120* 98 232*  BUN 41* 20 25* 20 18  CREATININE 5.77*  5.92* 3.39* 3.94* 3.55* 3.10*  CALCIUM 8.6 8.2* 8.6 8.4 8.0*  PHOS  --   --  3.3 3.8  --    Liver Function Tests:  Recent Labs Lab 08/06/13 0555 08/08/13 1504 08/09/13 0813  AST 81*  --   --   ALT 31  --   --   ALKPHOS  128*  --   --   BILITOT 1.1  --   --   PROT 7.3  --   --   ALBUMIN 3.2* 2.5* 2.4*    Recent Labs Lab 08/06/13 1219  LIPASE 34  AMYLASE 1053*   No results found for this basename: AMMONIA,  in the last 168 hours CBC:  Recent Labs Lab 08/06/13 0555  08/07/13 0144 08/08/13 1504 08/09/13 0857 08/10/13 0840 08/11/13 0235  WBC 5.9  < > 10.3 3.7* 2.4* 3.5* 5.0  NEUTROABS 4.1  --   --   --   --   --   --   HGB 14.0  < > 11.6* 11.6* 11.1* 12.0* 12.4*  HCT 44.8  < > 36.5* 36.5* 35.2* 37.4* 38.8*  MCV 100.7*  < > 97.6 96.8 96.4 96.1 94.9  PLT 192  < > 111* 110* 95* 110* 122*  < > = values in this interval not displayed. Cardiac Enzymes:  Recent Labs Lab 08/06/13 1219 08/06/13 1935  08/07/13 0144 08/07/13 0815  TROPONINI 0.94* 1.31* 1.19* 0.89*   BNP: No components found with this basename: POCBNP,  CBG:  Recent Labs Lab 08/10/13 0603 08/10/13 1116 08/10/13 1635 08/10/13 2134 08/11/13 0657  GLUCAP 94 109* 125* 114* 84    Recent Results (from the past 240 hour(s))  MRSA PCR SCREENING     Status: None   Collection Time    08/01/13  5:41 PM      Result Value Ref Range Status   MRSA by PCR NEGATIVE  NEGATIVE Final   Comment:            The GeneXpert MRSA Assay (FDA     approved for NASAL specimens     only), is one component of a     comprehensive MRSA colonization     surveillance program. It is not     intended to diagnose MRSA     infection nor to guide or     monitor treatment for     MRSA infections.  CULTURE, BLOOD (ROUTINE X 2)     Status: None   Collection Time    08/06/13  5:55 AM      Result Value Ref Range Status   Specimen Description BLOOD FOREARM RIGHT   Final   Special Requests BOTTLES DRAWN AEROBIC AND ANAEROBIC 6ML   Final   Culture  Setup Time     Final   Value: 08/06/2013 13:29     Performed at [facility]   Culture     Final   Value:        BLOOD CULTURE RECEIVED NO GROWTH TO DATE CULTURE WILL BE HELD FOR 5 DAYS BEFORE  ISSUING A FINAL NEGATIVE REPORT     Performed at [facility]   Report Status PENDING   Incomplete  CULTURE, BLOOD (ROUTINE X 2)     Status: None   Collection Time    08/06/13  6:30 AM      Result Value Ref Range Status   Specimen Description BLOOD RIGHT FOREARM   Final   Special Requests BOTTLES DRAWN AEROBIC AND ANAEROBIC 6CC   Final   Culture  Setup Time     Final   Value: 08/06/2013 17:59     Performed at [facility]   Culture     Final   Value:        BLOOD CULTURE RECEIVED NO GROWTH TO DATE CULTURE WILL BE HELD FOR 5 DAYS BEFORE ISSUING A FINAL NEGATIVE REPORT     Performed at [facility]   Report Status PENDING   Incomplete  MRSA PCR SCREENING     Status: None   Collection Time    08/06/13 10:42 AM      Result Value Ref Range Status   MRSA by PCR NEGATIVE  NEGATIVE Final   Comment:            The GeneXpert MRSA Assay (FDA     approved for NASAL specimens     only), is one component of a     comprehensive MRSA colonization     surveillance program. It is not     intended to diagnose MRSA     infection nor to guide or     monitor treatment for     MRSA infections.  RESPIRATORY VIRUS PANEL     Status: Abnormal   Collection Time    08/06/13 11:20 AM      Result Value Ref Range  Status   Source - RVPAN NASOPHARYNGEAL   Final   Respiratory Syncytial Virus A NOT DETECTED   Final   Respiratory Syncytial Virus B NOT DETECTED   Final   Influenza A DETECTED (*)  Final   Influenza B NOT DETECTED   Final   Parainfluenza 1 NOT DETECTED   Final   Parainfluenza 2 NOT DETECTED   Final   Parainfluenza 3 NOT DETECTED   Final   Metapneumovirus NOT DETECTED   Final   Rhinovirus NOT DETECTED   Final   Adenovirus NOT DETECTED   Final   Influenza A H1 NOT DETECTED   Final   Influenza A H3 DETECTED (*)  Final   Comment: (NOTE)           Normal Reference Range for each Analyte: NOT DETECTED     Testing performed using the Luminex xTAG Respiratory Viral Panel  test     kit.     This test was developed and its performance characteristics determined     by [facility]. It has not been cleared or approved by the [place]     Food and Drug Administration. This test is used for clinical purposes.     It should not be regarded as investigational or for research. This     laboratory is certified under the [place] (CLIA) as qualified to perform high complexity     clinical laboratory testing.     Performed at [facility]     Status: None   Collection Time    08/07/13  6:55 PM      Result Value Ref Range Status   Specimen Description URINE, RANDOM   Final   Special Requests NONE   Final   Culture  Setup Time     Final   Value: 08/07/2013 20:25     Performed at [facility]     Final   Value: NO GROWTH     Performed at [facility]   Culture     Final   Value: NO GROWTH     Performed at [facility]   Report Status 08/08/2013 FINAL   Final     Scheduled Meds: . cefTAZidime (FORTAZ)  IV  2 g Intravenous Q M,W,F-HD  . doxercalciferol  5 mcg Intravenous Q M,W,F-HD  . heparin  5,000 Units Subcutaneous 3 times per day  . insulin aspart  0-5 Units Subcutaneous QHS  . insulin aspart  0-9 Units Subcutaneous TID WC  . lip balm  1 application Topical BID  . metoprolol tartrate  12.5 mg Oral BID  . multivitamin  1 tablet Oral QHS  . oseltamivir  30 mg Oral Daily  . pantoprazole  40 mg Oral Daily  . polyethylene glycol  17 g Oral BID  . regadenoson  0.4 mg Intravenous Once  . saccharomyces boulardii  250 mg Oral BID  . vancomycin  750 mg Intravenous Q M,W,F-HD   Continuous Infusions: . diltiazem (CARDIZEM) infusion 10 mg/hr (08/11/13 0337)     Amarachi Kotz, DO  [facility] Pager [phone]  If 7PM-7AM, please contact night-coverage [email or web address] Password [email or web address] 08/11/2013, 8:58 AM   LOS: 5 days

## 2013-08-12 DIAGNOSIS — A419 Sepsis, unspecified organism: Secondary | ICD-10-CM

## 2013-08-12 DIAGNOSIS — R6520 Severe sepsis without septic shock: Secondary | ICD-10-CM

## 2013-08-12 LAB — CBC
HCT: 40.6 % (ref 39.0–52.0)
Hemoglobin: 13.3 g/dL (ref 13.0–17.0)
MCH: 30.7 pg (ref 26.0–34.0)
MCHC: 32.8 g/dL (ref 30.0–36.0)
MCV: 93.8 fL (ref 78.0–100.0)
Platelets: 135 10*3/uL — ABNORMAL LOW (ref 150–400)
RBC: 4.33 MIL/uL (ref 4.22–5.81)
RDW: 18.4 % — AB (ref 11.5–15.5)
WBC: 4.1 10*3/uL (ref 4.0–10.5)

## 2013-08-12 LAB — RENAL FUNCTION PANEL
Albumin: 2.1 g/dL — ABNORMAL LOW (ref 3.5–5.2)
BUN: 18 mg/dL (ref 6–23)
CALCIUM: 8.4 mg/dL (ref 8.4–10.5)
CO2: 24 meq/L (ref 19–32)
Chloride: 95 mEq/L — ABNORMAL LOW (ref 96–112)
Creatinine, Ser: 3.4 mg/dL — ABNORMAL HIGH (ref 0.50–1.35)
GFR calc Af Amer: 20 mL/min — ABNORMAL LOW (ref >90)
GFR, EST NON AFRICAN AMERICAN: 17 mL/min — AB (ref >90)
Glucose, Bld: 100 mg/dL — ABNORMAL HIGH (ref 70–99)
PHOSPHORUS: 2.1 mg/dL — AB (ref 2.3–4.6)
Potassium: 3.6 mEq/L — ABNORMAL LOW (ref 3.7–5.3)
Sodium: 134 mEq/L — ABNORMAL LOW (ref 137–147)

## 2013-08-12 LAB — GLUCOSE, CAPILLARY
GLUCOSE-CAPILLARY: 87 mg/dL (ref 70–99)
Glucose-Capillary: 142 mg/dL — ABNORMAL HIGH (ref 70–99)
Glucose-Capillary: 119 mg/dL — ABNORMAL HIGH (ref 70–99)
Glucose-Capillary: 136 mg/dL — ABNORMAL HIGH (ref 70–99)

## 2013-08-12 LAB — CULTURE, BLOOD (ROUTINE X 2): Culture: NO GROWTH

## 2013-08-12 MED ORDER — HEPARIN SODIUM (PORCINE) 1000 UNIT/ML DIALYSIS: 1000.0000 [IU] | INTRAMUSCULAR | Status: DC | PRN

## 2013-08-12 MED ORDER — DEXTROSE 5 % IV SOLN
1.0000 g | Freq: Once | INTRAVENOUS | Status: AC
  Administered 2013-08-12: 1 g via INTRAVENOUS
  Filled 2013-08-12: qty 1

## 2013-08-12 MED ORDER — VANCOMYCIN HCL IN DEXTROSE 750-5 MG/150ML-% IV SOLN
750.0000 mg | INTRAVENOUS | Status: DC
  Filled 2013-08-12: qty 150

## 2013-08-12 MED ORDER — LIDOCAINE-PRILOCAINE 2.5-2.5 % EX CREA: 1.0000 "application " | TOPICAL_CREAM | CUTANEOUS | Status: DC | PRN

## 2013-08-12 MED ORDER — NEPRO/CARBSTEADY PO LIQD: 237.0000 mL | ORAL | Status: DC | PRN

## 2013-08-12 MED ORDER — SODIUM CHLORIDE 0.9 % IV SOLN: 100.0000 mL | INTRAVENOUS | Status: DC | PRN

## 2013-08-12 MED ORDER — ALTEPLASE 2 MG IJ SOLR
2.0000 mg | Freq: Once | INTRAMUSCULAR | Status: DC | PRN
  Filled 2013-08-12: qty 2

## 2013-08-12 MED ORDER — PENTAFLUOROPROP-TETRAFLUOROETH EX AERO: 1.0000 "application " | INHALATION_SPRAY | CUTANEOUS | Status: DC | PRN

## 2013-08-12 MED ORDER — LIDOCAINE HCL (PF) 1 % IJ SOLN: 5.0000 mL | INTRAMUSCULAR | Status: DC | PRN

## 2013-08-12 MED ORDER — VANCOMYCIN HCL IN DEXTROSE 750-5 MG/150ML-% IV SOLN
750.0000 mg | Freq: Once | INTRAVENOUS | Status: AC
  Administered 2013-08-12: 750 mg via INTRAVENOUS
  Filled 2013-08-12: qty 150

## 2013-08-12 NOTE — Progress Notes (Signed)
Assessment/Plan:  1. PD cath infx, cellulitis 2. Hyperkalemia - s/p HD 3. ESRD/Volume overload - MWF, lowering dry weigh with aggressive HD, improved Hemodialysis done today post weight = 70.9 Kg, weight on 2/15 was 81.67 Kg!  Subjective: Interval History:  Breathing better with fluid off  Objective: Vital signs in last 24 hours: Temp:  [97 F (36.1 C)-97.6 F (36.4 C)] 97.4 F (36.3 C) (02/21 1013) Pulse Rate:  [76-109] 109 (02/21 1013) Resp:  [14-28] 16 (02/21 1013) BP: (120-166)/(59-87) 151/79 mmHg (02/21 1013) SpO2:  [91 %-98 %] 98 % (02/21 1013) Weight:  [70.9 kg (156 lb 4.9 oz)-77.5 kg (170 lb 13.7 oz)] 70.9 kg (156 lb 4.9 oz) (02/21 1013) Weight change: 0.8 kg (1 lb 12.2 oz)  Intake/Output from previous day: 02/20 0701 - 02/21 0700 In: -  Out: 4003  Intake/Output this shift: Total I/O In: -  Out: 2900 [Other:2900]  General appearance: alert, cooperative and face is thinner Resp: clear to auscultation bilaterally Cardio: regular rate and rhythm Extremities: extremities normal, atraumatic, no cyanosis or edema  Lab Results:  Recent Labs  08/11/13 0235 08/12/13 0711  WBC 5.0 4.1  HGB 12.4* 13.3  HCT 38.8* 40.6  PLT 122* 135*   BMET:  Recent Labs  08/11/13 1238 08/12/13 0712  NA 133* 134*  K 3.7 3.6*  CL 92* 95*  CO2 23 24  GLUCOSE 103* 100*  BUN 34* 18  CREATININE 4.75* 3.40*  CALCIUM 8.4 8.4   No results found for this basename: PTH,  in the last 72 hours Iron Studies: No results found for this basename: IRON, TIBC, TRANSFERRIN, FERRITIN,  in the last 72 hours Studies/Results: Dg Chest 2 View  08/11/2013   CLINICAL DATA:  Pulmonary edema.  EXAM: CHEST  2 VIEW  COMPARISON:  August 07, 2013.  FINDINGS: Stable cardiomegaly. Continued presence of interstitial densities are noted throughout both lungs most consistent with pulmonary edema or less likely pneumonia which is not significantly changed. Mild bilateral pleural effusions are noted. No  pneumothorax is noted. Bony thorax is intact.  IMPRESSION: Stable bilateral lung opacities are noted most consistent with pulmonary edema, although pneumonia cannot be excluded. Mild bilateral pleural effusions are noted.   Electronically Signed   By: Sabino Dick M.D.   On: 08/11/2013 10:46   Nm Myocar Single W/spect W/wall Motion And Ef  08/11/2013   CLINICAL DATA:  Chest pain  EXAM: MYOCARDIAL IMAGING WITH SPECT (REST)  TECHNIQUE: Standard myocardial SPECT imaging was performed after resting intravenous injection of 10 mCi Tc-30m sestamibi. Quantitative gated imaging was also performed to evaluate left ventricular wall motion, and estimate left ventricular ejection fraction.  COMPARISON:  None.  FINDINGS: SPECT imaging without gating was performed. There is moderately diminished activity throughout the inferior wall. There is also a perfusion defect at the apex and a final focal mid anteroseptal perfusion defect.  IMPRESSION: Rest imaging only without gating was performed. There is thinning of the inferior wall. There are focal defects at the apex and mid anteroseptal regions.   Electronically Signed   By: Maryclare Bean M.D.   On: 08/11/2013 11:22   Scheduled: . cefTAZidime (FORTAZ)  IV  1 g Intravenous Once  . cefTAZidime (FORTAZ)  IV  2 g Intravenous Q M,W,F-HD  . doxercalciferol  5 mcg Intravenous Q M,W,F-HD  . heparin  5,000 Units Subcutaneous 3 times per day  . insulin aspart  0-5 Units Subcutaneous QHS  . insulin aspart  0-9 Units Subcutaneous TID WC  .  lip balm  1 application Topical BID  . metoprolol tartrate  12.5 mg Oral BID  . multivitamin  1 tablet Oral QHS  . oseltamivir  30 mg Oral Daily  . pantoprazole  40 mg Oral Daily  . polyethylene glycol  17 g Oral BID  . saccharomyces boulardii  250 mg Oral BID  . vancomycin  750 mg Intravenous Q M,W,F-HD  . vancomycin  750 mg Intravenous Once      LOS: 6 days   Jeremaine Maraj C 08/12/2013,11:12 AM

## 2013-08-12 NOTE — Progress Notes (Signed)
[facility] PROGRESS NOTE  SMAYAN HACKBART MEQ:[id number] DOB: 1945-10-14 DOA: 08/06/2013 PCP: [name], MD  Assessment/Plan: Acute on chronic respiratory failure  -Patient remains hypoxemic with oxygen saturation in the low 90s-->increased nasal cannula to 4 L on 2/20 -remains fluid overloaded--> nephrology following for dialysis  -secondary to Bilateral pleural effusions/fluid overload  -Repeat chest x-ray revealed persistent interstitial edema with small b/l pleural effusions -COPD on nocturnal O2 w/out exacerbation  -Supplemental oxygen  -Albuterol / Ipratropium prn  Acute on chronic systolic CHF  -EF 29-79% on 08/07/13 echo  -Nuclear stress test canceled yesterday (08/11/2013) -SPECT scan without gated study showed focal defects in the apex and anteroseptal region Paroxysmal atrial flutter  -Developed on the evening of 08/09/2013  -Spontaneous conversion to sinus rhythm after Cardizem drip  -Continue metoprolol tartrate--remained in sinus rhythm  -CHADSVASc= 4  -defer anticoagulation decision to cardiology  -Defer chronotropic agent management to cardiology  Elevated troponin:  -Most likely from demand ischemia in the setting of ESRD  -ischemic workup once current illness resolves  -Echocardiogram EF 68-21%, grade 2 diastolic dysfunction, no WMA  -trop (1.31 max) -Appreciate cardiology followup  Severe sepsis  -improved  -Blood and urine cultures are negative -continue IV vanco  -d/c aztreonam and Flagyl  -start ceftazidime 2 grams with HD  -We'll give supplemental dose of vancomycin and cefotaxime today as the patient is getting extra dialysis sessions ESRD on HD - MWF  -Try to maintain CAPD catheter as long as the patient clinically improves  -HD per renal  -Trend BMP  Peritonitis  -S/p PD cath placement 2/10: Flushing well, healing well externally (? Extension tubing pulled off at home)  -Clinically improving without any further abdominal pain   -Continue vancomycin, ceftazidime  Flu A  -added tamiflu--started 08/08/2013  -Continue oseltamivir through 08/12/2013  Thrombocytopenia/leukopenia  -Platelets appear to be gradually improving  -likely due to influenza although HIT is consideration  -HIT panel--pending  -other culprits include meds--protonix, vanco  -continue to trend  DM  -intermitten Hypoglycemia due to glipizide  -SSI  -CBG 69-104; D50 IV as needed for hypoglycemia  -Hold Glipizide  -hemoglobin A1c--6.3  Lower extremity edema  -Left greater than right  -Venous duplex rule out DVT--negative for DVT  Renal Mass  -Left renal 1.1 cm hyperdensity likely represents hemorrhagic cyst  -Needs a 6 month surveillance CT  Family Communication: wife updated on phone  Disposition Plan: Home when medically stable  Antibiotics:  Vancomycin 08/06/2013>>>  Ceftazidime 08/09/2013>>>  Aztreonam 08/06/2013>>> 08/09/2013  Flagyl 08/06/13>>>08/09/13           Procedures/Studies: Ct Abdomen Pelvis Wo Contrast  08/06/2013   CLINICAL DATA:  Abdominal pain and fever. Recent surgical procedure for peritoneal dialysis 5 days ago.  EXAM: CT ABDOMEN AND PELVIS WITHOUT CONTRAST  TECHNIQUE: Multidetector CT imaging of the abdomen and pelvis was performed following the standard protocol without intravenous contrast.  COMPARISON:  None.  FINDINGS: Lung bases demonstrate a moderate size right pleural effusion and small left pleural effusion with associated airspace opacification which may be due to atelectasis or infection. There is mild cardiomegaly.  Abdominal images demonstrate evidence of a prior cholecystectomy. There is a small amount of perihepatic fluid. There is mild fatty infiltration of the liver with subtle nodular contour. The pancreas and adrenal glands are unremarkable. Appendix is within normal. Kidneys are normal in size with bilateral renal cortical thinning. There is a 6 cm exophytic simple cyst over the mid pole of the  left kidney.  There are a few small of bilateral hypodensities likely cysts. There is a 1.1 cm homogeneously hyperdense mass over the lower pole of the left kidney with Hounsfield unit measurements 45 likely a hemorrhagic cyst. Ureters are within normal. There is no hydronephrosis or nephrolithiasis. There is diverticulosis of the colon most prominent over the sigmoid colon. There is a peritoneal dialysis catheter entering the left mid abdomen with tip coiled over the pelvis between loops of rectosigmoid colon. There is moderate calcified plaque of the thoracoabdominal aorta and iliac arteries. Minimal free fluid over the right pericolic gutter. No evidence of free peritoneal air.  Remaining pelvic structures are notable only for moderate fecal retention over the rectosigmoid colon. There is mild degenerative change of the spine and hips.  IMPRESSION: Moderate size right pleural effusion and small left pleural effusion with associated airspace opacification in the lower lobes which may be due to pneumonia or atelectasis.  Peritoneal dialysis catheter with tip coiled over the pelvis between several recto sigmoid colonic loops.  Changes involving the liver which may be due to cirrhosis. Small amount of ascites.  Bilateral renal cortical thinning and bilateral renal cysts. 1.1 cm homogeneously hyperdense left renal mass likely a hemorrhagic cyst. Recommend followup CT 6 months.  Diverticulosis of the colon. Moderate fecal retention over the rectosigmoid colon.  Small hiatal hernia.   Electronically Signed   By: Marin Olp M.D.   On: 08/06/2013 07:57   Dg Chest 2 View  08/11/2013   CLINICAL DATA:  Pulmonary edema.  EXAM: CHEST  2 VIEW  COMPARISON:  August 07, 2013.  FINDINGS: Stable cardiomegaly. Continued presence of interstitial densities are noted throughout both lungs most consistent with pulmonary edema or less likely pneumonia which is not significantly changed. Mild bilateral pleural effusions are noted.  No pneumothorax is noted. Bony thorax is intact.  IMPRESSION: Stable bilateral lung opacities are noted most consistent with pulmonary edema, although pneumonia cannot be excluded. Mild bilateral pleural effusions are noted.   Electronically Signed   By: Sabino Dick M.D.   On: 08/11/2013 10:46   Nm Myocar Single W/spect W/wall Motion And Ef  08/11/2013   CLINICAL DATA:  Chest pain  EXAM: MYOCARDIAL IMAGING WITH SPECT (REST)  TECHNIQUE: Standard myocardial SPECT imaging was performed after resting intravenous injection of 10 mCi [id number]. Quantitative gated imaging was also performed to evaluate left ventricular wall motion, and estimate left ventricular ejection fraction.  COMPARISON:  None.  FINDINGS: SPECT imaging without gating was performed. There is moderately diminished activity throughout the inferior wall. There is also a perfusion defect at the apex and a final focal mid anteroseptal perfusion defect.  IMPRESSION: Rest imaging only without gating was performed. There is thinning of the inferior wall. There are focal defects at the apex and mid anteroseptal regions.   Electronically Signed   By: AMaryclare BeanM.D.   On: 08/11/2013 11:22   Dg Chest Port 1 View  08/07/2013   CLINICAL DATA:  Shortness of breath, pneumonia  EXAM: PORTABLE CHEST - 1 VIEW  COMPARISON:  DG CHEST 1V PORT dated 08/06/2013; DG CHEST 1V PORT dated 08/02/2013; DG CHEST 2 VIEW dated 04/25/2013; DG CHEST 1V PORT dated 02/16/2013  FINDINGS: Grossly unchanged enlarged cardiac silhouette and mediastinal contours with atherosclerotic plaque within the thoracic aorta. The pulmonary vasculature is indistinct with cephalization of flow. Interval increase in now small layering bilateral effusions with associated worsening bibasilar opacities, left greater than right. No definite evidence of edema. Grossly unchanged  bones including presumed resection of the distal end of the right clavicle.  IMPRESSION: Worsening pulmonary edema, small  layering bilateral effusions and associated bibasilar opacities, left greater than right, atelectasis versus infiltrate.   Electronically Signed   By: Sandi Mariscal M.D.   On: 08/07/2013 07:36   Dg Chest Port 1 View  08/06/2013   CLINICAL DATA:  68 year old male with shortness of breath, fever abdominal pain cyanosis. Initial encounter.  EXAM: PORTABLE CHEST - 1 VIEW  COMPARISON:  08/02/2013 and earlier.  FINDINGS: Portable AP semi upright view at 0612 hrs.  Extubated. Decreased bilateral veiling pulmonary opacity. Improved retrocardiac ventilation. Stable cardiac size and mediastinal contours. No pneumothorax. Stable pulmonary vascularity/interstitial opacity.  IMPRESSION: 1. Extubated. 2. Decreased appearance of bilateral pleural effusions and improved bibasilar ventilation. 3. Continued  pulmonary vascular congestion/interstitial edema.   Electronically Signed   By: Lars Pinks M.D.   On: 08/06/2013 06:31   Dg Chest Port 1 View  08/02/2013   CLINICAL DATA:  Respiratory failure.  EXAM: PORTABLE CHEST - 1 VIEW  COMPARISON:  DG CHEST 1V PORT dated 08/01/2013  FINDINGS: Endotracheal tube is approximately 3 cm above the carina. Degree of pulmonary edema at may be mildly improved. There are likely bilateral pleural effusions. The heart remains moderately enlarged.  IMPRESSION: Mild decrease in pulmonary edema.   Electronically Signed   By: Aletta Edouard M.D.   On: 08/02/2013 07:40   Dg Chest Port 1 View  08/01/2013   CLINICAL DATA:  Status post re-intubation  EXAM: PORTABLE CHEST - 1 VIEW  COMPARISON:  05/22/2013  FINDINGS: An endotracheal tube is seen 3.5 cm above the carinal. The cardiac shadow is prominent. Vascular congestion with mild posteriorly layering effusions is identified. A temporary dialysis catheter is not seen on this film. Correlation with the clinical findings is recommended.  IMPRESSION: Vascular congestion and effusions which may be related to volume overload.  Endotracheal tube as described.    Electronically Signed   By: Inez Catalina M.D.   On: 08/01/2013 16:22         Subjective: Patient is breathing better today. He is more alert since Ambien was discontinued. Denies any chest discomfort, headache, visual disturbance, nausea, vomiting, diarrhea, abdominal pain. He is breathing better than yesterday, but still has some dyspnea.  Objective: Filed Vitals:   08/12/13 0718 08/12/13 0730 08/12/13 0800 08/12/13 0830  BP: 134/65 140/71 158/78 159/82  Pulse: 96 94 93 95  Temp:      TempSrc:      Resp:      Height:      Weight:      SpO2:        Intake/Output Summary (Last 24 hours) at 08/12/13 0854 Last data filed at 08/11/13 1638  Gross per 24 hour  Intake      0 ml  Output   4003 ml  Net  -4003 ml   Weight change: 0.8 kg (1 lb 12.2 oz) Exam:   General:  Pt is alert, follows commands appropriately, not in acute distress  HEENT: No icterus, No thrush,  [place]/AT  Cardiovascular: RRR, S1/S2, no rubs, no gallops+ mild JVD  Respiratory: Bibasilar crackles. No wheezing. Good air movement.  Abdomen: Soft/+BS, non tender, non distended, no guarding  Extremities: No edema, No lymphangitis, No petechiae, No rashes, no synovitis  Data Reviewed: Basic Metabolic Panel:  Recent Labs Lab 08/08/13 1504 08/09/13 0813 08/10/13 0840 08/11/13 1238 08/12/13 0712  NA 137 132* 129* 133* 134*  K 4.0 5.9* 3.4* 3.7 3.6*  CL 95* 94* 91* 92* 95*  CO2 _0 GLUCOSE 120* 98 232* 103* 100*  BUN 25* 20 18 34* 18  CREATININE 3.94* 3.55* 3.10* 4.75* 3.40*  CALCIUM 8.6 8.4 8.0* 8.4 8.4  PHOS 3.3 3.8  --  3.2 2.1*   Liver Function Tests:  Recent Labs Lab 08/06/13 0555 08/08/13 1504 08/09/13 0813 08/11/13 1238 08/12/13 0712  AST 81*  --   --   --   --   ALT 31  --   --   --   --   ALKPHOS 128*  --   --   --   --   BILITOT 1.1  --   --   --   --   PROT 7.3  --   --   --   --   ALBUMIN 3.2* 2.5* 2.4* 2.3* 2.1*    Recent Labs Lab 08/06/13 1219  LIPASE 34   AMYLASE 1053*   No results found for this basename: AMMONIA,  in the last 168 hours CBC:  Recent Labs Lab 08/06/13 0555  08/08/13 1504 08/09/13 0857 08/10/13 0840 08/11/13 0235 08/12/13 0711  WBC 5.9  < > 3.7* 2.4* 3.5* 5.0 4.1  NEUTROABS 4.1  --   --   --   --   --   --   HGB 14.0  < > 11.6* 11.1* 12.0* 12.4* 13.3  HCT 44.8  < > 36.5* 35.2* 37.4* 38.8* 40.6  MCV 100.7*  < > 96.8 96.4 96.1 94.9 93.8  PLT 192  < > 110* 95* 110* 122* 135*  < > = values in this interval not displayed. Cardiac Enzymes:  Recent Labs Lab 08/06/13 1219 08/06/13 1935 08/07/13 0144 08/07/13 0815  TROPONINI 0.94* 1.31* 1.19* 0.89*   BNP: No components found with this basename: POCBNP,  CBG:  Recent Labs Lab 08/11/13 0657 08/11/13 1100 08/11/13 1655 08/11/13 2134 08/12/13 0612  GLUCAP 84 77 79 113* 87    Recent Results (from the past 240 hour(s))  CULTURE, BLOOD (ROUTINE X 2)     Status: None   Collection Time    08/06/13  5:55 AM      Result Value Ref Range Status   Specimen Description BLOOD FOREARM RIGHT   Final   Special Requests BOTTLES DRAWN AEROBIC AND ANAEROBIC 6ML   Final   Culture  Setup Time     Final   Value: 08/06/2013 13:29     Performed at [facility]   Culture     Final   Value:        BLOOD CULTURE RECEIVED NO GROWTH TO DATE CULTURE WILL BE HELD FOR 5 DAYS BEFORE ISSUING A FINAL NEGATIVE REPORT     Performed at [facility]   Report Status PENDING   Incomplete  CULTURE, BLOOD (ROUTINE X 2)     Status: None   Collection Time    08/06/13  6:30 AM      Result Value Ref Range Status   Specimen Description BLOOD RIGHT FOREARM   Final   Special Requests BOTTLES DRAWN AEROBIC AND ANAEROBIC [facility]   Final   Culture  Setup Time     Final   Value: 08/06/2013 17:59     Performed at [facility]   Culture     Final   Value:        BLOOD CULTURE RECEIVED NO GROWTH TO DATE CULTURE WILL BE  HELD FOR 5 DAYS BEFORE ISSUING A FINAL NEGATIVE REPORT      Performed at [facility]   Report Status PENDING   Incomplete  MRSA PCR SCREENING     Status: None   Collection Time    08/06/13 10:42 AM      Result Value Ref Range Status   MRSA by PCR NEGATIVE  NEGATIVE Final   Comment:            The GeneXpert MRSA Assay (FDA     approved for NASAL specimens     only), is one component of a     comprehensive MRSA colonization     surveillance program. It is not     intended to diagnose MRSA     infection nor to guide or     monitor treatment for     MRSA infections.  RESPIRATORY VIRUS PANEL     Status: Abnormal   Collection Time    08/06/13 11:20 AM      Result Value Ref Range Status   Source - RVPAN NASOPHARYNGEAL   Final   Respiratory Syncytial Virus A NOT DETECTED   Final   Respiratory Syncytial Virus B NOT DETECTED   Final   Influenza A DETECTED (*)  Final   Influenza B NOT DETECTED   Final   Parainfluenza 1 NOT DETECTED   Final   Parainfluenza 2 NOT DETECTED   Final   Parainfluenza 3 NOT DETECTED   Final   Metapneumovirus NOT DETECTED   Final   Rhinovirus NOT DETECTED   Final   Adenovirus NOT DETECTED   Final   Influenza A H1 NOT DETECTED   Final   Influenza A H3 DETECTED (*)  Final   Comment: (NOTE)           Normal Reference Range for each Analyte: NOT DETECTED     Testing performed using the Luminex xTAG Respiratory Viral Panel test     kit.     This test was developed and its performance characteristics determined     by [facility]. It has not been cleared or approved by the [place]     Food and Drug Administration. This test is used for clinical purposes.     It should not be regarded as investigational or for research. This     laboratory is certified under the [place] (CLIA) as qualified to perform high complexity     clinical laboratory testing.     Performed at [facility]     Status: None   Collection Time    08/07/13  6:55 PM       Result Value Ref Range Status   Specimen Description URINE, RANDOM   Final   Special Requests NONE   Final   Culture  Setup Time     Final   Value: 08/07/2013 20:25     Performed at [place]     Final   Value: NO GROWTH     Performed at [facility]   Culture     Final   Value: NO GROWTH     Performed at [facility]   Report Status 08/08/2013 FINAL   Final     Scheduled Meds: . cefTAZidime (FORTAZ)  IV  1 g Intravenous Once  . cefTAZidime (FORTAZ)  IV  2 g Intravenous Q M,W,F-HD  . doxercalciferol  5 mcg Intravenous Q M,W,F-HD  . heparin  5,000 Units Subcutaneous 3 times per day  . insulin aspart  0-5 Units Subcutaneous QHS  . insulin aspart  0-9 Units Subcutaneous TID WC  . lip balm  1 application Topical BID  . metoprolol tartrate  12.5 mg Oral BID  . multivitamin  1 tablet Oral QHS  . oseltamivir  30 mg Oral Daily  . pantoprazole  40 mg Oral Daily  . polyethylene glycol  17 g Oral BID  . saccharomyces boulardii  250 mg Oral BID  . vancomycin  750 mg Intravenous Q M,W,F-HD  . vancomycin  750 mg Intravenous Once   Continuous Infusions:    Floris Neuhaus, DO  [facility] Pager [phone]  If 7PM-7AM, please contact night-coverage [email or web address] Password [email or web address] 08/12/2013, 8:54 AM   LOS: 6 days

## 2013-08-12 NOTE — Progress Notes (Addendum)
Central Telemetry notified that the pt had 13 beats of V-tach  with a HR in the 80s.. Telemetry strip assessed Pt sitting on the side of the bed and had just came back from ambulating from the bathroom. Pt asymptomatic and stable with no complaints and currently in NSR with HR in the 80s. Will continue to monitor.

## 2013-08-12 NOTE — Progress Notes (Signed)
SUBJECTIVE:  He thinks his breathing is better  OBJECTIVE:   Vitals:   Filed Vitals:   08/12/13 0900 08/12/13 0930 08/12/13 1000 08/12/13 1013  BP: 138/69 163/79 149/76 151/79  Pulse: 97 96 106 109  Temp:    97.4 F (36.3 C)  TempSrc:    Oral  Resp:    16  Height:      Weight:    156 lb 4.9 oz (70.9 kg)  SpO2:    98%   I&O's:   Intake/Output Summary (Last 24 hours) at 08/12/13 1146 Last data filed at 08/12/13 1013  Gross per 24 hour  Intake      0 ml  Output   6903 ml  Net  -6903 ml   TELEMETRY: Reviewed telemetry pt in NSR:     PHYSICAL EXAM General: Well developed, well nourished, in no acute distress Head: Eyes PERRLA, No xanthomas.   Normal cephalic and atramatic  Lungs:   Few crackles at bases Heart:   HRRR S1 S2 Pulses are 2+ & equal. Abdomen: Bowel sounds are positive, abdomen soft and non-tender without masses  Extremities:   No clubbing, cyanosis or edema.  DP +1 Neuro: Alert and oriented X 3. Psych:  Good affect, responds appropriately   LABS: Basic Metabolic Panel:  Recent Labs  08/11/13 1238 08/12/13 0712  NA 133* 134*  K 3.7 3.6*  CL 92* 95*  CO2 23 24  GLUCOSE 103* 100*  BUN 34* 18  CREATININE 4.75* 3.40*  CALCIUM 8.4 8.4  PHOS 3.2 2.1*   Liver Function Tests:  Recent Labs  08/11/13 1238 08/12/13 0712  ALBUMIN 2.3* 2.1*   No results found for this basename: LIPASE, AMYLASE,  in the last 72 hours CBC:  Recent Labs  08/11/13 0235 08/12/13 0711  WBC 5.0 4.1  HGB 12.4* 13.3  HCT 38.8* 40.6  MCV 94.9 93.8  PLT 122* 135*   Cardiac Enzymes: No results found for this basename: CKTOTAL, CKMB, CKMBINDEX, TROPONINI,  in the last 72 hours BNP: No components found with this basename: POCBNP,  D-Dimer: No results found for this basename: DDIMER,  in the last 72 hours Hemoglobin A1C:  Recent Labs  08/11/13 0235  HGBA1C 6.3*   Fasting Lipid Panel: No results found for this basename: CHOL, HDL, LDLCALC, TRIG, CHOLHDL,  LDLDIRECT,  in the last 72 hours Thyroid Function Tests: No results found for this basename: TSH, T4TOTAL, FREET3, T3FREE, THYROIDAB,  in the last 72 hours Anemia Panel: No results found for this basename: VITAMINB12, FOLATE, FERRITIN, TIBC, IRON, RETICCTPCT,  in the last 72 hours Coag Panel:   Lab Results  Component Value Date   INR 1.06 08/11/2013   INR 1.60* 05/24/2013   INR 2.00* 05/23/2013    RADIOLOGY: Ct Abdomen Pelvis Wo Contrast  08/06/2013   CLINICAL DATA:  Abdominal pain and fever. Recent surgical procedure for peritoneal dialysis 5 days ago.  EXAM: CT ABDOMEN AND PELVIS WITHOUT CONTRAST  TECHNIQUE: Multidetector CT imaging of the abdomen and pelvis was performed following the standard protocol without intravenous contrast.  COMPARISON:  None.  FINDINGS: Lung bases demonstrate a moderate size right pleural effusion and small left pleural effusion with associated airspace opacification which may be due to atelectasis or infection. There is mild cardiomegaly.  Abdominal images demonstrate evidence of a prior cholecystectomy. There is a small amount of perihepatic fluid. There is mild fatty infiltration of the liver with subtle nodular contour. The pancreas and adrenal glands are unremarkable. Appendix is  within normal. Kidneys are normal in size with bilateral renal cortical thinning. There is a 6 cm exophytic simple cyst over the mid pole of the left kidney. There are a few small of bilateral hypodensities likely cysts. There is a 1.1 cm homogeneously hyperdense mass over the lower pole of the left kidney with Hounsfield unit measurements 45 likely a hemorrhagic cyst. Ureters are within normal. There is no hydronephrosis or nephrolithiasis. There is diverticulosis of the colon most prominent over the sigmoid colon. There is a peritoneal dialysis catheter entering the left mid abdomen with tip coiled over the pelvis between loops of rectosigmoid colon. There is moderate calcified plaque of the  thoracoabdominal aorta and iliac arteries. Minimal free fluid over the right pericolic gutter. No evidence of free peritoneal air.  Remaining pelvic structures are notable only for moderate fecal retention over the rectosigmoid colon. There is mild degenerative change of the spine and hips.  IMPRESSION: Moderate size right pleural effusion and small left pleural effusion with associated airspace opacification in the lower lobes which may be due to pneumonia or atelectasis.  Peritoneal dialysis catheter with tip coiled over the pelvis between several recto sigmoid colonic loops.  Changes involving the liver which may be due to cirrhosis. Small amount of ascites.  Bilateral renal cortical thinning and bilateral renal cysts. 1.1 cm homogeneously hyperdense left renal mass likely a hemorrhagic cyst. Recommend followup CT 6 months.  Diverticulosis of the colon. Moderate fecal retention over the rectosigmoid colon.  Small hiatal hernia.   Electronically Signed   By: Marin Olp M.D.   On: 08/06/2013 07:57   Dg Chest 2 View  08/11/2013   CLINICAL DATA:  Pulmonary edema.  EXAM: CHEST  2 VIEW  COMPARISON:  August 07, 2013.  FINDINGS: Stable cardiomegaly. Continued presence of interstitial densities are noted throughout both lungs most consistent with pulmonary edema or less likely pneumonia which is not significantly changed. Mild bilateral pleural effusions are noted. No pneumothorax is noted. Bony thorax is intact.  IMPRESSION: Stable bilateral lung opacities are noted most consistent with pulmonary edema, although pneumonia cannot be excluded. Mild bilateral pleural effusions are noted.   Electronically Signed   By: Sabino Dick M.D.   On: 08/11/2013 10:46   Nm Myocar Single W/spect W/wall Motion And Ef  08/11/2013   CLINICAL DATA:  Chest pain  EXAM: MYOCARDIAL IMAGING WITH SPECT (REST)  TECHNIQUE: Standard myocardial SPECT imaging was performed after resting intravenous injection of 10 mCi Tc-12m sestamibi.  Quantitative gated imaging was also performed to evaluate left ventricular wall motion, and estimate left ventricular ejection fraction.  COMPARISON:  None.  FINDINGS: SPECT imaging without gating was performed. There is moderately diminished activity throughout the inferior wall. There is also a perfusion defect at the apex and a final focal mid anteroseptal perfusion defect.  IMPRESSION: Rest imaging only without gating was performed. There is thinning of the inferior wall. There are focal defects at the apex and mid anteroseptal regions.   Electronically Signed   By: Maryclare Bean M.D.   On: 08/11/2013 11:22   Dg Chest Port 1 View  08/07/2013   CLINICAL DATA:  Shortness of breath, pneumonia  EXAM: PORTABLE CHEST - 1 VIEW  COMPARISON:  DG CHEST 1V PORT dated 08/06/2013; DG CHEST 1V PORT dated 08/02/2013; DG CHEST 2 VIEW dated 04/25/2013; DG CHEST 1V PORT dated 02/16/2013  FINDINGS: Grossly unchanged enlarged cardiac silhouette and mediastinal contours with atherosclerotic plaque within the thoracic aorta. The pulmonary vasculature is  indistinct with cephalization of flow. Interval increase in now small layering bilateral effusions with associated worsening bibasilar opacities, left greater than right. No definite evidence of edema. Grossly unchanged bones including presumed resection of the distal end of the right clavicle.  IMPRESSION: Worsening pulmonary edema, small layering bilateral effusions and associated bibasilar opacities, left greater than right, atelectasis versus infiltrate.   Electronically Signed   By: Sandi Mariscal M.D.   On: 08/07/2013 07:36   Dg Chest Port 1 View  08/06/2013   CLINICAL DATA:  68 year old male with shortness of breath, fever abdominal pain cyanosis. Initial encounter.  EXAM: PORTABLE CHEST - 1 VIEW  COMPARISON:  08/02/2013 and earlier.  FINDINGS: Portable AP semi upright view at 0612 hrs.  Extubated. Decreased bilateral veiling pulmonary opacity. Improved retrocardiac ventilation.  Stable cardiac size and mediastinal contours. No pneumothorax. Stable pulmonary vascularity/interstitial opacity.  IMPRESSION: 1. Extubated. 2. Decreased appearance of bilateral pleural effusions and improved bibasilar ventilation. 3. Continued  pulmonary vascular congestion/interstitial edema.   Electronically Signed   By: Lars Pinks M.D.   On: 08/06/2013 06:31   Dg Chest Port 1 View  08/02/2013   CLINICAL DATA:  Respiratory failure.  EXAM: PORTABLE CHEST - 1 VIEW  COMPARISON:  DG CHEST 1V PORT dated 08/01/2013  FINDINGS: Endotracheal tube is approximately 3 cm above the carina. Degree of pulmonary edema at may be mildly improved. There are likely bilateral pleural effusions. The heart remains moderately enlarged.  IMPRESSION: Mild decrease in pulmonary edema.   Electronically Signed   By: Aletta Edouard M.D.   On: 08/02/2013 07:40   Dg Chest Port 1 View  08/01/2013   CLINICAL DATA:  Status post re-intubation  EXAM: PORTABLE CHEST - 1 VIEW  COMPARISON:  05/22/2013  FINDINGS: An endotracheal tube is seen 3.5 cm above the carinal. The cardiac shadow is prominent. Vascular congestion with mild posteriorly layering effusions is identified. A temporary dialysis catheter is not seen on this film. Correlation with the clinical findings is recommended.  IMPRESSION: Vascular congestion and effusions which may be related to volume overload.  Endotracheal tube as described.   Electronically Signed   By: Inez Catalina M.D.   On: 08/01/2013 16:22    Principal Problem:  Severe sepsis(995.92)  Active Problems:  Glottis carcinoma  Acute respiratory failure  Diabetes mellitus  HTN (hypertension)  ESRD (end stage renal disease) on dialysis  Anemia of chronic disease  Protein-calorie malnutrition, severe  Other malaise and fatigue  COPD GOLD II  Peritonitis  HCAP (healthcare-associated pneumonia)  CAPD (continuous ambulatory peritoneal dialysis) catheter in place  Elevated troponin level  Atrial flutter    Valvular heart disease - moderate AI, moderate TR, mild MR by echo 08/07/13  Acute on chronic CHF with newly recognized systolic dysfunction EF [id number]  Thrombocytopenia  Leukopenia  ASSESSMENT: 1.  NSTEMI type II demand ischemia in the setting of influenza and sepsis 2.  Acute combined systolic/diastolic CHF - post weight today after HD 70kg and was 81kg on 2/15 3.  Atrial flutter with RVR converted to NSR on 2/19 4.  Moderate MR/TR 5.  Mild to moderate LV dysfunction EF 40-45% 6. ESRD on HD MWF with PD catheter peritonitis and severe sepsis, still febrile to 100.3 on 2/18  7.  Influenza A, on tamiflu  8.  Acute respiratory failure, improving   9. Anemia/thrombocytopenia/leukopenia - per IM - Hgb variable from 11-16 over February  10. Glottic squamous cell carcinoma s/p radiation  11. Severe protein-calorie malnutrition  12. COPD on nocturnal O2, h/o trache placement  13. Renal mass by CT, possibly hemorrhagic cyst, recommend f/u CT 6 months per radiology note - will defer to IM 14.  Ventricular tachycardia 13 beats  PLAN:   1.  Nuclear stress test cancelled yesterday due to increased lethargy and SOB with wheezing - he is much better today.  Will try to get Lexiscan portion of stress test done tomorrow am 2.  His HCADS VASC score is high but this is the first time he has had afib/flutter and it was in the setting of acute influenza and respiratory failure with hypoxia.  Would not anticoagulate at this time unless he has further problems with arrhythmias in the future.  He has been weak at home and falling a lot so would be at increased risk for anticoagulation.  Sueanne Margarita, MD  08/12/2013  11:46 AM

## 2013-08-12 NOTE — Progress Notes (Addendum)
While Rounding found pt sitting up on his bed with his oxygen removed. Pt educated on the importance of keeping his oxygen on. Pt back in bed with oxygen on 4L nasal cannula, will continue to monitor.

## 2013-08-12 NOTE — Progress Notes (Signed)
ANTIBIOTIC CONSULT NOTE - FOLLOW UP  Pharmacy Consult for Vancomycin, Tressie Ellis Indication: HCAP, sepsis, peritionitis   Allergies  Allergen Reactions  . Amoxicillin     Causes upset stomach  . Fentanyl     Confusion when patch applied  . Nsaids Other (See Comments)    Severe kidney disease/ESRD    Labs:  Recent Labs  08/10/13 0840 08/11/13 0235 08/11/13 1238 08/12/13 0711 08/12/13 0712  WBC 3.5* 5.0  --  4.1  --   HGB 12.0* 12.4*  --  13.3  --   PLT 110* 122*  --  135*  --   CREATININE 3.10*  --  4.75*  --  3.40*   Estimated Creatinine Clearance: 21.1 ml/min (by C-G formula based on Cr of 3.4). No results found for this basename: VANCOTROUGH, Corlis Leak, VANCORANDOM, [place], GENTPEAK, GENTRANDOM, TOBRATROUGH, TOBRAPEAK, TOBRARND, AMIKACINPEAK, AMIKACINTROU, AMIKACIN,  in the last 72 hours   Microbiology: Recent Results (from the past 720 hour(s))  MRSA PCR SCREENING     Status: None   Collection Time    08/01/13  5:41 PM      Result Value Ref Range Status   MRSA by PCR NEGATIVE  NEGATIVE Final   Comment:            The GeneXpert MRSA Assay (FDA     approved for NASAL specimens     only), is one component of a     comprehensive MRSA colonization     surveillance program. It is not     intended to diagnose MRSA     infection nor to guide or     monitor treatment for     MRSA infections.  CULTURE, BLOOD (ROUTINE X 2)     Status: None   Collection Time    08/06/13  5:55 AM      Result Value Ref Range Status   Specimen Description BLOOD FOREARM RIGHT   Final   Special Requests BOTTLES DRAWN AEROBIC AND ANAEROBIC 6ML   Final   Culture  Setup Time     Final   Value: 08/06/2013 13:29     Performed at [facility]   Culture     Final   Value:        BLOOD CULTURE RECEIVED NO GROWTH TO DATE CULTURE WILL BE HELD FOR 5 DAYS BEFORE ISSUING A FINAL NEGATIVE REPORT     Performed at [facility]   Report Status PENDING   Incomplete  CULTURE, BLOOD  (ROUTINE X 2)     Status: None   Collection Time    08/06/13  6:30 AM      Result Value Ref Range Status   Specimen Description BLOOD RIGHT FOREARM   Final   Special Requests BOTTLES DRAWN AEROBIC AND ANAEROBIC 6CC   Final   Culture  Setup Time     Final   Value: 08/06/2013 17:59     Performed at [facility]   Culture     Final   Value:        BLOOD CULTURE RECEIVED NO GROWTH TO DATE CULTURE WILL BE HELD FOR 5 DAYS BEFORE ISSUING A FINAL NEGATIVE REPORT     Performed at [facility]   Report Status PENDING   Incomplete  MRSA PCR SCREENING     Status: None   Collection Time    08/06/13 10:42 AM      Result Value Ref Range Status   MRSA by PCR NEGATIVE  NEGATIVE  Final   Comment:            The GeneXpert MRSA Assay (FDA     approved for NASAL specimens     only), is one component of a     comprehensive MRSA colonization     surveillance program. It is not     intended to diagnose MRSA     infection nor to guide or     monitor treatment for     MRSA infections.  RESPIRATORY VIRUS PANEL     Status: Abnormal   Collection Time    08/06/13 11:20 AM      Result Value Ref Range Status   Source - RVPAN NASOPHARYNGEAL   Final   Respiratory Syncytial Virus A NOT DETECTED   Final   Respiratory Syncytial Virus B NOT DETECTED   Final   Influenza A DETECTED (*)  Final   Influenza B NOT DETECTED   Final   Parainfluenza 1 NOT DETECTED   Final   Parainfluenza 2 NOT DETECTED   Final   Parainfluenza 3 NOT DETECTED   Final   Metapneumovirus NOT DETECTED   Final   Rhinovirus NOT DETECTED   Final   Adenovirus NOT DETECTED   Final   Influenza A H1 NOT DETECTED   Final   Influenza A H3 DETECTED (*)  Final   Comment: (NOTE)           Normal Reference Range for each Analyte: NOT DETECTED     Testing performed using the Luminex xTAG Respiratory Viral Panel test     kit.     This test was developed and its performance characteristics determined     by [facility]. It has  not been cleared or approved by the [place]     Food and Drug Administration. This test is used for clinical purposes.     It should not be regarded as investigational or for research. This     laboratory is certified under the [place] (CLIA) as qualified to perform high complexity     clinical laboratory testing.     Performed at [place]     Status: None   Collection Time    08/07/13  6:55 PM      Result Value Ref Range Status   Specimen Description URINE, RANDOM   Final   Special Requests NONE   Final   Culture  Setup Time     Final   Value: 08/07/2013 20:25     Performed at [facility]     Final   Value: NO GROWTH     Performed at [facility]   Culture     Final   Value: NO GROWTH     Performed at [facility]   Report Status 08/08/2013 FINAL   Final    Anti-infectives   Start     Dose/Rate Route Frequency Ordered Stop   08/12/13 1200  cefTAZidime (FORTAZ) 1 g in dextrose 5 % 50 mL IVPB     1 g 100 mL/hr over 30 Minutes Intravenous  Once 08/12/13 0838     08/12/13 1200  vancomycin (VANCOCIN) IVPB 750 mg/150 ml premix     750 mg 150 mL/hr over 60 Minutes Intravenous  Once 08/12/13 1044     08/12/13 1115  vancomycin (VANCOCIN) IVPB 750 mg/150 ml premix  Status:  Discontinued  750 mg 150 mL/hr over 60 Minutes Intravenous To Hemodialysis 08/12/13 0842 08/12/13 1044   08/11/13 1200  cefTAZidime (FORTAZ) 2 g in dextrose 5 % 50 mL IVPB     2 g 100 mL/hr over 30 Minutes Intravenous Every M-W-F (Hemodialysis) 08/09/13 1444     08/11/13 1200  vancomycin (VANCOCIN) IVPB 750 mg/150 ml premix     750 mg 150 mL/hr over 60 Minutes Intravenous Every M-W-F (Hemodialysis) 08/09/13 1617     08/09/13 1530  cefTAZidime (FORTAZ) 2 g in dextrose 5 % 50 mL IVPB     2 g 100 mL/hr over 30 Minutes Intravenous  Once 08/09/13 1443 08/09/13 1613   08/09/13 1400  metroNIDAZOLE (FLAGYL)  tablet 500 mg  Status:  Discontinued     500 mg Oral 3 times per day 08/09/13 0836 08/09/13 1443   08/09/13 0845  vancomycin (VANCOCIN) IVPB 750 mg/150 ml premix     750 mg 150 mL/hr over 60 Minutes Intravenous  Once 08/09/13 0836 08/09/13 1522   08/08/13 1230  oseltamivir (TAMIFLU) capsule 30 mg     30 mg Oral Daily 08/08/13 1132 08/13/13 0959   08/08/13 1115  vancomycin (VANCOCIN) IVPB 750 mg/150 ml premix     750 mg 150 mL/hr over 60 Minutes Intravenous  Once 08/08/13 1114 08/08/13 1328   08/07/13 1600  vancomycin (VANCOCIN) IVPB 750 mg/150 ml premix     750 mg 150 mL/hr over 60 Minutes Intravenous  Once 08/07/13 1346 08/07/13 1636   08/07/13 1100  aztreonam (AZACTAM) 1 g in dextrose 5 % 50 mL IVPB  Status:  Discontinued     1 g 100 mL/hr over 30 Minutes Intravenous Every 24 hours 08/06/13 0955 08/09/13 1443   08/06/13 2000  vancomycin (VANCOCIN) IVPB 750 mg/150 ml premix     750 mg 150 mL/hr over 60 Minutes Intravenous  Once 08/06/13 1718 08/06/13 2343   08/06/13 1400  metroNIDAZOLE (FLAGYL) IVPB 500 mg  Status:  Discontinued     500 mg 100 mL/hr over 60 Minutes Intravenous Every 8 hours 08/06/13 0955 08/09/13 0836   08/06/13 1030  vancomycin (VANCOCIN) 1,500 mg in sodium chloride 0.9 % 500 mL IVPB     1,500 mg 250 mL/hr over 120 Minutes Intravenous NOW 08/06/13 0955 08/06/13 1257   08/06/13 1030  aztreonam (AZACTAM) 1 g in dextrose 5 % 50 mL IVPB     1 g 100 mL/hr over 30 Minutes Intravenous NOW 08/06/13 0955 08/06/13 1127   08/06/13 0630  levofloxacin (LEVAQUIN) IVPB 750 mg     750 mg 100 mL/hr over 90 Minutes Intravenous  Once 08/06/13 0618 08/06/13 0806   08/06/13 0630  metroNIDAZOLE (FLAGYL) IVPB 500 mg     500 mg 100 mL/hr over 60 Minutes Intravenous  Once 08/06/13 0618 08/06/13 0737      Assessment: 68yo male with peritionitis, sepsis now on Vancomycin and Fortaz for peritonitis He has received extra HD session today for fluid overload, had ~ 3 hrs with BFR of  400 HD days not yet set -- normally MWF  Vancomycin since 2/15, cultures negative to date  Goal of Therapy:  Pre-HD Vancomycin level 15-25   Plan:  1. Continue Vancomycin 750 mg iv after each HD session (normal days MWF) - vancomycin dose given after extra session today. 2. Continue Fortaz 2g IV q HD.  Giving extra 1 g after session today. 3. Will continue to follow for extra HD sessions, cultures, and clinical course.  Thanks!  Janett Billow  Rosanna Randy, BCPS  Clinical Pharmacist Pager [phone]  08/12/2013 11:18 AM

## 2013-08-12 NOTE — Progress Notes (Signed)
Found pt without oxygen during rounds. Pt stable, placed back on 4L nasal cannula of oxygen. Pt educated on the importance of wearing oxygen. Will continue to monitor.

## 2013-08-12 NOTE — Progress Notes (Signed)
Pt's IV sites out of date. Pt has one restricted extremity. IV insertation attempted on pt's right arm, attempt unsuccessful. Pt's current IV sites, flushing and clean, dry, and intact. Pt currently in HD will make day shift nurse aware, so that IV team may be notified.

## 2013-08-13 ENCOUNTER — Inpatient Hospital Stay (HOSPITAL_COMMUNITY): Payer: Medicare Other

## 2013-08-13 DIAGNOSIS — R079 Chest pain, unspecified: Secondary | ICD-10-CM

## 2013-08-13 DIAGNOSIS — I10 Essential (primary) hypertension: Secondary | ICD-10-CM

## 2013-08-13 LAB — CULTURE, BLOOD (ROUTINE X 2): Culture: NO GROWTH

## 2013-08-13 LAB — GLUCOSE, CAPILLARY
GLUCOSE-CAPILLARY: 113 mg/dL — AB (ref 70–99)
GLUCOSE-CAPILLARY: 135 mg/dL — AB (ref 70–99)
Glucose-Capillary: 121 mg/dL — ABNORMAL HIGH (ref 70–99)
Glucose-Capillary: 150 mg/dL — ABNORMAL HIGH (ref 70–99)

## 2013-08-13 MED ORDER — REGADENOSON 0.4 MG/5ML IV SOLN
0.4000 mg | Freq: Once | INTRAVENOUS | Status: AC
  Administered 2013-08-13: 0.4 mg via INTRAVENOUS
  Filled 2013-08-13: qty 5

## 2013-08-13 MED ORDER — TECHNETIUM TC 99M SESTAMIBI - CARDIOLITE
30.0000 | Freq: Once | INTRAVENOUS | Status: AC | PRN
  Administered 2013-08-13: 30 via INTRAVENOUS

## 2013-08-13 MED ORDER — REGADENOSON 0.4 MG/5ML IV SOLN
INTRAVENOUS | Status: AC
  Administered 2013-08-13: 0.4 mg via INTRAVENOUS
  Filled 2013-08-13: qty 5

## 2013-08-13 NOTE — Progress Notes (Signed)
Assessment/Plan:  1. PD cath infx, cellulitis better 2. ESRD/Volume overload/CHF - MWF, lowering dry weigh with aggressive HD, improved 3    NSTEMI, cardiac w/u pending Last Hemodialysis done post weight = 70.9 Kg on 2/21, weight on 2/15 was 81.67 Kg! Next HD Monday to keep dry.  Needs a little more fluid off to get to new EDW.  Subjective: Interval History: Ambulating some, dyspnea less  Objective: Vital signs in last 24 hours: Temp:  [97.5 F (36.4 C)-97.6 F (36.4 C)] 97.5 F (36.4 C) (02/22 0515) Pulse Rate:  [94-101] 101 (02/22 0515) Resp:  [18] 18 (02/22 0515) BP: (106-125)/(60-64) 125/62 mmHg (02/22 0515) SpO2:  [94 %-100 %] 97 % (02/22 0515) Weight:  [72.3 kg (159 lb 6.3 oz)] 72.3 kg (159 lb 6.3 oz) (02/22 0515) Weight change: -6.6 kg (-14 lb 8.8 oz)  Intake/Output from previous day: 02/21 0701 - 02/22 0700 In: 600 [P.O.:600] Out: 2900  Intake/Output this shift:    General appearance: alert and cooperative Cardio: regular rate and rhythm, S1, S2 normal, no murmur, click, rub or gallop GI: not examined Extremities: edema trace  Lab Results:  Recent Labs  08/11/13 0235 08/12/13 0711  WBC 5.0 4.1  HGB 12.4* 13.3  HCT 38.8* 40.6  PLT 122* 135*   BMET:  Recent Labs  08/11/13 1238 08/12/13 0712  NA 133* 134*  K 3.7 3.6*  CL 92* 95*  CO2 23 24  GLUCOSE 103* 100*  BUN 34* 18  CREATININE 4.75* 3.40*  CALCIUM 8.4 8.4   No results found for this basename: PTH,  in the last 72 hours Iron Studies: No results found for this basename: IRON, TIBC, TRANSFERRIN, FERRITIN,  in the last 72 hours Studies/Results: Nm Myocar Single W/spect W/wall Motion And Ef  08/11/2013   CLINICAL DATA:  Chest pain  EXAM: MYOCARDIAL IMAGING WITH SPECT (REST)  TECHNIQUE: Standard myocardial SPECT imaging was performed after resting intravenous injection of 10 mCi Tc-35m sestamibi. Quantitative gated imaging was also performed to evaluate left ventricular wall motion, and estimate left  ventricular ejection fraction.  COMPARISON:  None.  FINDINGS: SPECT imaging without gating was performed. There is moderately diminished activity throughout the inferior wall. There is also a perfusion defect at the apex and a final focal mid anteroseptal perfusion defect.  IMPRESSION: Rest imaging only without gating was performed. There is thinning of the inferior wall. There are focal defects at the apex and mid anteroseptal regions.   Electronically Signed   By: Maryclare Bean M.D.   On: 08/11/2013 11:22   Dg Chest Port 1 View  08/12/2013   CLINICAL DATA:  Followup pulmonary edema.  Status post dialysis.  EXAM: PORTABLE CHEST - 1 VIEW  COMPARISON:  08/11/13  FINDINGS: Asymmetric pulmonary edema shows mild improvement since previous study. Probable small pleural effusions again noted, however there is improved aeration in both lung bases. Heart size is stable.  IMPRESSION: Mild interval improvement in diffuse pulmonary edema. Persistent small pleural effusions.   Electronically Signed   By: Earle Gell M.D.   On: 08/12/2013 13:12   Scheduled: . cefTAZidime (FORTAZ)  IV  2 g Intravenous Q M,W,F-HD  . doxercalciferol  5 mcg Intravenous Q M,W,F-HD  . heparin  5,000 Units Subcutaneous 3 times per day  . insulin aspart  0-5 Units Subcutaneous QHS  . insulin aspart  0-9 Units Subcutaneous TID WC  . lip balm  1 application Topical BID  . metoprolol tartrate  12.5 mg Oral BID  .  multivitamin  1 tablet Oral QHS  . pantoprazole  40 mg Oral Daily  . polyethylene glycol  17 g Oral BID  . regadenoson      . saccharomyces boulardii  250 mg Oral BID  . vancomycin  750 mg Intravenous Q M,W,F-HD      LOS: 7 days   [name] 08/13/2013,10:43 AM

## 2013-08-13 NOTE — Progress Notes (Signed)
[facility] PROGRESS NOTE  [name] VEH:[id number] DOB: [birth year] DOA: 08/06/2013 PCP: [name], MD  Assessment/Plan: Acute on chronic respiratory failure  -Patient remains hypoxemic with oxygen saturation in the low 90s-->increased nasal cannula to 4 L on 2/20--now stable on 3L  -secondary to Bilateral pleural effusions/fluid overload  -weight 81.8kg on 2/115/15-->70.9kg on 08/12/13 -08/11/13 Repeat chest x-ray revealed persistent interstitial edema with small b/l pleural effusions  -COPD on nocturnal O2 w/out exacerbation  -Supplemental oxygen  -Albuterol / Ipratropium prn  Acute on chronic systolic CHF  -EF 83-66% on 08/07/13 echo  -Nuclear stress test canceled yesterday (08/11/2013)  -SPECT scan without gated study showed focal defects in the apex and anteroseptal region  -Lexiscan planned 08/13/13 Paroxysmal atrial flutter  -Developed on the evening of 08/09/2013  -Spontaneous conversion to sinus rhythm after Cardizem drip  -Continue metoprolol tartrate--remained in sinus rhythm  -CHADSVASc= 4  -defer anticoagulation decision to cardiology  -Defer chronotropic agent management to cardiology  Elevated troponin:  -Most likely from demand ischemia in the setting of ESRD  -ischemic workup once current illness resolves  -Echocardiogram EF 29-47%, grade 2 diastolic dysfunction, no WMA  -trop (1.31 max)  -Appreciate cardiology followup  Severe sepsis  -improved  -Blood and urine cultures are negative  -continue IV vanco  -d/c aztreonam and Flagyl  -start ceftazidime 2 grams with HD  -plan antibiotics on HD until 08/20/13  ESRD on HD - MWF  -Try to maintain CAPD catheter as long as the patient clinically improves  -HD per renal  -Trend BMP  Peritonitis  -S/p PD cath placement 2/10: Flushing well, healing well externally (? Extension tubing pulled off at home)  -Clinically improving without any further abdominal pain  -Continue vancomycin, ceftazidime-->plan  antibiotics on HD until 08/20/13 Flu A  -added tamiflu--started 08/08/2013  -Continue oseltamivir through 08/12/2013  Thrombocytopenia/leukopenia  -Platelets appear to be gradually improving  -likely due to influenza although HIT is consideration  -HIT panel--pending  -other culprits include meds--protonix, vanco  -continue to trend  DM  -intermitten Hypoglycemia due to glipizide  -SSI  -CBG 69-104; D50 IV as needed for hypoglycemia  -Hold Glipizide  -hemoglobin A1c--6.3  Lower extremity edema  -Left greater than right  -Venous duplex rule out DVT--negative for DVT  Renal Mass  -Left renal 1.1 cm hyperdensity likely represents hemorrhagic cyst  -Needs a 6 month surveillance CT  Family Communication: wife updated on phone  Disposition Plan: Home when medically stable  Antibiotics:  Vancomycin 08/06/2013>>>  Ceftazidime 08/09/2013>>>  Aztreonam 08/06/2013>>> 08/09/2013  Flagyl 08/06/13>>>08/09/13       Procedures/Studies: Ct Abdomen Pelvis Wo Contrast  08/06/2013   CLINICAL DATA:  Abdominal pain and fever. Recent surgical procedure for peritoneal dialysis 5 days ago.  EXAM: CT ABDOMEN AND PELVIS WITHOUT CONTRAST  TECHNIQUE: Multidetector CT imaging of the abdomen and pelvis was performed following the standard protocol without intravenous contrast.  COMPARISON:  None.  FINDINGS: Lung bases demonstrate a moderate size right pleural effusion and small left pleural effusion with associated airspace opacification which may be due to atelectasis or infection. There is mild cardiomegaly.  Abdominal images demonstrate evidence of a prior cholecystectomy. There is a small amount of perihepatic fluid. There is mild fatty infiltration of the liver with subtle nodular contour. The pancreas and adrenal glands are unremarkable. Appendix is within normal. Kidneys are normal in size with bilateral renal cortical thinning. There is a 6 cm exophytic simple cyst over the mid pole of the left kidney.  There are a few small of bilateral hypodensities likely cysts. There is a 1.1 cm homogeneously hyperdense mass over the lower pole of the left kidney with Hounsfield unit measurements 45 likely a hemorrhagic cyst. Ureters are within normal. There is no hydronephrosis or nephrolithiasis. There is diverticulosis of the colon most prominent over the sigmoid colon. There is a peritoneal dialysis catheter entering the left mid abdomen with tip coiled over the pelvis between loops of rectosigmoid colon. There is moderate calcified plaque of the thoracoabdominal aorta and iliac arteries. Minimal free fluid over the right pericolic gutter. No evidence of free peritoneal air.  Remaining pelvic structures are notable only for moderate fecal retention over the rectosigmoid colon. There is mild degenerative change of the spine and hips.  IMPRESSION: Moderate size right pleural effusion and small left pleural effusion with associated airspace opacification in the lower lobes which may be due to pneumonia or atelectasis.  Peritoneal dialysis catheter with tip coiled over the pelvis between several recto sigmoid colonic loops.  Changes involving the liver which may be due to cirrhosis. Small amount of ascites.  Bilateral renal cortical thinning and bilateral renal cysts. 1.1 cm homogeneously hyperdense left renal mass likely a hemorrhagic cyst. Recommend followup CT 6 months.  Diverticulosis of the colon. Moderate fecal retention over the rectosigmoid colon.  Small hiatal hernia.   Electronically Signed   By: Marin Olp M.D.   On: 08/06/2013 07:57   Dg Chest 2 View  08/11/2013   CLINICAL DATA:  Pulmonary edema.  EXAM: CHEST  2 VIEW  COMPARISON:  August 07, 2013.  FINDINGS: Stable cardiomegaly. Continued presence of interstitial densities are noted throughout both lungs most consistent with pulmonary edema or less likely pneumonia which is not significantly changed. Mild bilateral pleural effusions are noted. No  pneumothorax is noted. Bony thorax is intact.  IMPRESSION: Stable bilateral lung opacities are noted most consistent with pulmonary edema, although pneumonia cannot be excluded. Mild bilateral pleural effusions are noted.   Electronically Signed   By: Sabino Dick M.D.   On: 08/11/2013 10:46   Nm Myocar Single W/spect W/wall Motion And Ef  08/11/2013   CLINICAL DATA:  Chest pain  EXAM: MYOCARDIAL IMAGING WITH SPECT (REST)  TECHNIQUE: Standard myocardial SPECT imaging was performed after resting intravenous injection of 10 mCi Tc-25msestamibi. Quantitative gated imaging was also performed to evaluate left ventricular wall motion, and estimate left ventricular ejection fraction.  COMPARISON:  None.  FINDINGS: SPECT imaging without gating was performed. There is moderately diminished activity throughout the inferior wall. There is also a perfusion defect at the apex and a final focal mid anteroseptal perfusion defect.  IMPRESSION: Rest imaging only without gating was performed. There is thinning of the inferior wall. There are focal defects at the apex and mid anteroseptal regions.   Electronically Signed   By: AMaryclare BeanM.D.   On: 08/11/2013 11:22   Dg Chest Port 1 View  08/12/2013   CLINICAL DATA:  Followup pulmonary edema.  Status post dialysis.  EXAM: PORTABLE CHEST - 1 VIEW  COMPARISON:  08/11/13  FINDINGS: Asymmetric pulmonary edema shows mild improvement since previous study. Probable small pleural effusions again noted, however there is improved aeration in both lung bases. Heart size is stable.  IMPRESSION: Mild interval improvement in diffuse pulmonary edema. Persistent small pleural effusions.   Electronically Signed   By: JEarle GellM.D.   On: 08/12/2013 13:12   Dg Chest Port 1 View  08/07/2013  CLINICAL DATA:  Shortness of breath, pneumonia  EXAM: PORTABLE CHEST - 1 VIEW  COMPARISON:  DG CHEST 1V PORT dated 08/06/2013; DG CHEST 1V PORT dated 08/02/2013; DG CHEST 2 VIEW dated 04/25/2013; DG CHEST  1V PORT dated 02/16/2013  FINDINGS: Grossly unchanged enlarged cardiac silhouette and mediastinal contours with atherosclerotic plaque within the thoracic aorta. The pulmonary vasculature is indistinct with cephalization of flow. Interval increase in now small layering bilateral effusions with associated worsening bibasilar opacities, left greater than right. No definite evidence of edema. Grossly unchanged bones including presumed resection of the distal end of the right clavicle.  IMPRESSION: Worsening pulmonary edema, small layering bilateral effusions and associated bibasilar opacities, left greater than right, atelectasis versus infiltrate.   Electronically Signed   By: Sandi Mariscal M.D.   On: 08/07/2013 07:36   Dg Chest Port 1 View  08/06/2013   CLINICAL DATA:  68 year old male with shortness of breath, fever abdominal pain cyanosis. Initial encounter.  EXAM: PORTABLE CHEST - 1 VIEW  COMPARISON:  08/02/2013 and earlier.  FINDINGS: Portable AP semi upright view at 0612 hrs.  Extubated. Decreased bilateral veiling pulmonary opacity. Improved retrocardiac ventilation. Stable cardiac size and mediastinal contours. No pneumothorax. Stable pulmonary vascularity/interstitial opacity.  IMPRESSION: 1. Extubated. 2. Decreased appearance of bilateral pleural effusions and improved bibasilar ventilation. 3. Continued  pulmonary vascular congestion/interstitial edema.   Electronically Signed   By: Lars Pinks M.D.   On: 08/06/2013 06:31   Dg Chest Port 1 View  08/02/2013   CLINICAL DATA:  Respiratory failure.  EXAM: PORTABLE CHEST - 1 VIEW  COMPARISON:  DG CHEST 1V PORT dated 08/01/2013  FINDINGS: Endotracheal tube is approximately 3 cm above the carina. Degree of pulmonary edema at may be mildly improved. There are likely bilateral pleural effusions. The heart remains moderately enlarged.  IMPRESSION: Mild decrease in pulmonary edema.   Electronically Signed   By: Aletta Edouard M.D.   On: 08/02/2013 07:40   Dg Chest  Port 1 View  08/01/2013   CLINICAL DATA:  Status post re-intubation  EXAM: PORTABLE CHEST - 1 VIEW  COMPARISON:  05/22/2013  FINDINGS: An endotracheal tube is seen 3.5 cm above the carinal. The cardiac shadow is prominent. Vascular congestion with mild posteriorly layering effusions is identified. A temporary dialysis catheter is not seen on this film. Correlation with the clinical findings is recommended.  IMPRESSION: Vascular congestion and effusions which may be related to volume overload.  Endotracheal tube as described.   Electronically Signed   By: Inez Catalina M.D.   On: 08/01/2013 16:22         Subjective: Patient is feeling better. Abdominal pain is improving. Denies any fevers, chills, chest discomfort, shortness breath, nausea, vomiting, diarrhea, abdominal pain.  Objective: Filed Vitals:   08/12/13 1013 08/12/13 1158 08/12/13 2104 08/13/13 0515  BP: 151/79 106/64 123/60 125/62  Pulse: 109 98 94 101  Temp: 97.4 F (36.3 C) 97.5 F (36.4 C) 97.6 F (36.4 C) 97.5 F (36.4 C)  TempSrc: Oral Oral Oral Oral  Resp: _0 Height:      Weight: 70.9 kg (156 lb 4.9 oz)   72.3 kg (159 lb 6.3 oz)  SpO2: 98% 94% 100% 97%    Intake/Output Summary (Last 24 hours) at 08/13/13 0829 Last data filed at 08/12/13 2225  Gross per 24 hour  Intake    600 ml  Output   2900 ml  Net  -2300 ml   Weight change: -6.6  kg (-14 lb 8.8 oz) Exam:   General:  Pt is alert, follows commands appropriately, not in acute distress  HEENT: No icterus, No thrush,  [place]/AT  Cardiovascular: RRR, S1/S2, no rubs, no gallops  Respiratory: Diminished breath sounds at the bases. Clear to auscultation. No wheezing.  Abdomen: Soft/+BS, non tender, non distended, no guarding  Extremities: No edema, No lymphangitis, No petechiae, No rashes, no synovitis  Data Reviewed: Basic Metabolic Panel:  Recent Labs Lab 08/08/13 1504 08/09/13 0813 08/10/13 0840 08/11/13 1238 08/12/13 0712  NA 137 132*  129* 133* 134*  K 4.0 5.9* 3.4* 3.7 3.6*  CL 95* 94* 91* 92* 95*  CO2 _0 GLUCOSE 120* 98 232* 103* 100*  BUN 25* 20 18 34* 18  CREATININE 3.94* 3.55* 3.10* 4.75* 3.40*  CALCIUM 8.6 8.4 8.0* 8.4 8.4  PHOS 3.3 3.8  --  3.2 2.1*   Liver Function Tests:  Recent Labs Lab 08/08/13 1504 08/09/13 0813 08/11/13 1238 08/12/13 0712  ALBUMIN 2.5* 2.4* 2.3* 2.1*    Recent Labs Lab 08/06/13 1219  LIPASE 34  AMYLASE 1053*   No results found for this basename: AMMONIA,  in the last 168 hours CBC:  Recent Labs Lab 08/08/13 1504 08/09/13 0857 08/10/13 0840 08/11/13 0235 08/12/13 0711  WBC 3.7* 2.4* 3.5* 5.0 4.1  HGB 11.6* 11.1* 12.0* 12.4* 13.3  HCT 36.5* 35.2* 37.4* 38.8* 40.6  MCV 96.8 96.4 96.1 94.9 93.8  PLT 110* 95* 110* 122* 135*   Cardiac Enzymes:  Recent Labs Lab 08/06/13 1219 08/06/13 1935 08/07/13 0144 08/07/13 0815  TROPONINI 0.94* 1.31* 1.19* 0.89*   BNP: No components found with this basename: POCBNP,  CBG:  Recent Labs Lab 08/12/13 0612 08/12/13 1157 08/12/13 1618 08/12/13 2057 08/13/13 0617  GLUCAP 87 142* 119* 136* 113*    Recent Results (from the past 240 hour(s))  CULTURE, BLOOD (ROUTINE X 2)     Status: None   Collection Time    08/06/13  5:55 AM      Result Value Ref Range Status   Specimen Description BLOOD FOREARM RIGHT   Final   Special Requests BOTTLES DRAWN AEROBIC AND ANAEROBIC 6ML   Final   Culture  Setup Time     Final   Value: 08/06/2013 13:29     Performed at [facility]   Culture     Final   Value:        BLOOD CULTURE RECEIVED NO GROWTH TO DATE CULTURE WILL BE HELD FOR 5 DAYS BEFORE ISSUING A FINAL NEGATIVE REPORT     Performed at [facility]   Report Status PENDING   Incomplete  CULTURE, BLOOD (ROUTINE X 2)     Status: None   Collection Time    08/06/13  6:30 AM      Result Value Ref Range Status   Specimen Description BLOOD RIGHT FOREARM   Final   Special Requests BOTTLES DRAWN AEROBIC  AND ANAEROBIC [facility]   Final   Culture  Setup Time     Final   Value: 08/06/2013 17:59     Performed at [facility]   Culture     Final   Value: NO GROWTH 5 DAYS     Performed at [facility]   Report Status 08/12/2013 FINAL   Final  MRSA PCR SCREENING     Status: None   Collection Time    08/06/13 10:42 AM      Result  Value Ref Range Status   MRSA by PCR NEGATIVE  NEGATIVE Final   Comment:            The GeneXpert MRSA Assay (FDA     approved for NASAL specimens     only), is one component of a     comprehensive MRSA colonization     surveillance program. It is not     intended to diagnose MRSA     infection nor to guide or     monitor treatment for     MRSA infections.  RESPIRATORY VIRUS PANEL     Status: Abnormal   Collection Time    08/06/13 11:20 AM      Result Value Ref Range Status   Source - RVPAN NASOPHARYNGEAL   Final   Respiratory Syncytial Virus A NOT DETECTED   Final   Respiratory Syncytial Virus B NOT DETECTED   Final   Influenza A DETECTED (*)  Final   Influenza B NOT DETECTED   Final   Parainfluenza 1 NOT DETECTED   Final   Parainfluenza 2 NOT DETECTED   Final   Parainfluenza 3 NOT DETECTED   Final   Metapneumovirus NOT DETECTED   Final   Rhinovirus NOT DETECTED   Final   Adenovirus NOT DETECTED   Final   Influenza A H1 NOT DETECTED   Final   Influenza A H3 DETECTED (*)  Final   Comment: (NOTE)           Normal Reference Range for each Analyte: NOT DETECTED     Testing performed using the Luminex xTAG Respiratory Viral Panel test     kit.     This test was developed and its performance characteristics determined     by [facility]. It has not been cleared or approved by the [place]     Food and Drug Administration. This test is used for clinical purposes.     It should not be regarded as investigational or for research. This     laboratory is certified under the [place] (CLIA) as  qualified to perform high complexity     clinical laboratory testing.     Performed at [facility]     Status: None   Collection Time    08/07/13  6:55 PM      Result Value Ref Range Status   Specimen Description URINE, RANDOM   Final   Special Requests NONE   Final   Culture  Setup Time     Final   Value: 08/07/2013 20:25     Performed at [place]     Final   Value: NO GROWTH     Performed at [facility]   Culture     Final   Value: NO GROWTH     Performed at [facility]   Report Status 08/08/2013 FINAL   Final     Scheduled Meds: . cefTAZidime (FORTAZ)  IV  2 g Intravenous Q M,W,F-HD  . doxercalciferol  5 mcg Intravenous Q M,W,F-HD  . heparin  5,000 Units Subcutaneous 3 times per day  . insulin aspart  0-5 Units Subcutaneous QHS  . insulin aspart  0-9 Units Subcutaneous TID WC  . lip balm  1 application Topical BID  . metoprolol tartrate  12.5 mg Oral BID  . multivitamin  1 tablet Oral QHS  . pantoprazole  40 mg Oral Daily  . polyethylene glycol  17 g Oral BID  . saccharomyces boulardii  250 mg Oral BID  . vancomycin  750 mg Intravenous Q M,W,F-HD   Continuous Infusions:    Dominika Losey, DO  [facility] Pager [phone]  If 7PM-7AM, please contact night-coverage [email or web address] Password [email or web address] 08/13/2013, 8:29 AM   LOS: 7 days

## 2013-08-13 NOTE — Progress Notes (Signed)
Nuclear stress test results reviewed.  His nuclear stress test is markedly abnormal showingeversible myocardial perfusion defects at the inferior/inferolateral and anteroseptal walls of the left ventricle. Mildly decreased left ventricular ejection fraction of 38%. No focal wall motion abnormalities.  Will make NPO after MN for possible cath in the am if volume status is stable.

## 2013-08-13 NOTE — Progress Notes (Addendum)
SUBJECTIVE:  Feels much better today.  No SOB  OBJECTIVE:   Vitals:   Filed Vitals:   08/12/13 1013 08/12/13 1158 08/12/13 2104 08/13/13 0515  BP: 151/79 106/64 123/60 125/62  Pulse: 109 98 94 101  Temp: 97.4 F (36.3 C) 97.5 F (36.4 C) 97.6 F (36.4 C) 97.5 F (36.4 C)  TempSrc: Oral Oral Oral Oral  Resp: 16 18 18 18   Height:      Weight: 156 lb 4.9 oz (70.9 kg)   159 lb 6.3 oz (72.3 kg)  SpO2: 98% 94% 100% 97%   I&O's:   Intake/Output Summary (Last 24 hours) at 08/13/13 0753 Last data filed at 08/12/13 2225  Gross per 24 hour  Intake    600 ml  Output   2900 ml  Net  -2300 ml   TELEMETRY: Reviewed telemetry pt in NSR     PHYSICAL EXAM General: Well developed, well nourished, in no acute distress Head: Eyes PERRLA, No xanthomas.   Normal cephalic and atramatic  Lungs:   Clear bilaterally to auscultation and percussion. Heart:   HRRR S1 S2 Pulses are 2+ & equal. Abdomen: Bowel sounds are positive, abdomen soft and non-tender without masses  Extremities:   No clubbing, cyanosis or edema.  DP +1 Neuro: Alert and oriented X 3. Psych:  Good affect, responds appropriately   LABS: Basic Metabolic Panel:  Recent Labs  08/11/13 1238 08/12/13 0712  NA 133* 134*  K 3.7 3.6*  CL 92* 95*  CO2 23 24  GLUCOSE 103* 100*  BUN 34* 18  CREATININE 4.75* 3.40*  CALCIUM 8.4 8.4  PHOS 3.2 2.1*   Liver Function Tests:  Recent Labs  08/11/13 1238 08/12/13 0712  ALBUMIN 2.3* 2.1*   No results found for this basename: LIPASE, AMYLASE,  in the last 72 hours CBC:  Recent Labs  08/11/13 0235 08/12/13 0711  WBC 5.0 4.1  HGB 12.4* 13.3  HCT 38.8* 40.6  MCV 94.9 93.8  PLT 122* 135*   Cardiac Enzymes: No results found for this basename: CKTOTAL, CKMB, CKMBINDEX, TROPONINI,  in the last 72 hours BNP: No components found with this basename: POCBNP,  D-Dimer: No results found for this basename: DDIMER,  in the last 72 hours Hemoglobin A1C:  Recent Labs  08/11/13 0235  HGBA1C 6.3*   Fasting Lipid Panel: No results found for this basename: CHOL, HDL, LDLCALC, TRIG, CHOLHDL, LDLDIRECT,  in the last 72 hours Thyroid Function Tests: No results found for this basename: TSH, T4TOTAL, FREET3, T3FREE, THYROIDAB,  in the last 72 hours Anemia Panel: No results found for this basename: VITAMINB12, FOLATE, FERRITIN, TIBC, IRON, RETICCTPCT,  in the last 72 hours Coag Panel:   Lab Results  Component Value Date   INR 1.06 08/11/2013   INR 1.60* 05/24/2013   INR 2.00* 05/23/2013    RADIOLOGY: Ct Abdomen Pelvis Wo Contrast  08/06/2013   CLINICAL DATA:  Abdominal pain and fever. Recent surgical procedure for peritoneal dialysis 5 days ago.  EXAM: CT ABDOMEN AND PELVIS WITHOUT CONTRAST  TECHNIQUE: Multidetector CT imaging of the abdomen and pelvis was performed following the standard protocol without intravenous contrast.  COMPARISON:  None.  FINDINGS: Lung bases demonstrate a moderate size right pleural effusion and small left pleural effusion with associated airspace opacification which may be due to atelectasis or infection. There is mild cardiomegaly.  Abdominal images demonstrate evidence of a prior cholecystectomy. There is a small amount of perihepatic fluid. There is mild fatty infiltration of the  liver with subtle nodular contour. The pancreas and adrenal glands are unremarkable. Appendix is within normal. Kidneys are normal in size with bilateral renal cortical thinning. There is a 6 cm exophytic simple cyst over the mid pole of the left kidney. There are a few small of bilateral hypodensities likely cysts. There is a 1.1 cm homogeneously hyperdense mass over the lower pole of the left kidney with Hounsfield unit measurements 45 likely a hemorrhagic cyst. Ureters are within normal. There is no hydronephrosis or nephrolithiasis. There is diverticulosis of the colon most prominent over the sigmoid colon. There is a peritoneal dialysis catheter entering the left  mid abdomen with tip coiled over the pelvis between loops of rectosigmoid colon. There is moderate calcified plaque of the thoracoabdominal aorta and iliac arteries. Minimal free fluid over the right pericolic gutter. No evidence of free peritoneal air.  Remaining pelvic structures are notable only for moderate fecal retention over the rectosigmoid colon. There is mild degenerative change of the spine and hips.  IMPRESSION: Moderate size right pleural effusion and small left pleural effusion with associated airspace opacification in the lower lobes which may be due to pneumonia or atelectasis.  Peritoneal dialysis catheter with tip coiled over the pelvis between several recto sigmoid colonic loops.  Changes involving the liver which may be due to cirrhosis. Small amount of ascites.  Bilateral renal cortical thinning and bilateral renal cysts. 1.1 cm homogeneously hyperdense left renal mass likely a hemorrhagic cyst. Recommend followup CT 6 months.  Diverticulosis of the colon. Moderate fecal retention over the rectosigmoid colon.  Small hiatal hernia.   Electronically Signed   By: Marin Olp M.D.   On: 08/06/2013 07:57   Dg Chest 2 View  08/11/2013   CLINICAL DATA:  Pulmonary edema.  EXAM: CHEST  2 VIEW  COMPARISON:  August 07, 2013.  FINDINGS: Stable cardiomegaly. Continued presence of interstitial densities are noted throughout both lungs most consistent with pulmonary edema or less likely pneumonia which is not significantly changed. Mild bilateral pleural effusions are noted. No pneumothorax is noted. Bony thorax is intact.  IMPRESSION: Stable bilateral lung opacities are noted most consistent with pulmonary edema, although pneumonia cannot be excluded. Mild bilateral pleural effusions are noted.   Electronically Signed   By: Sabino Dick M.D.   On: 08/11/2013 10:46   Nm Myocar Single W/spect W/wall Motion And Ef  08/11/2013   CLINICAL DATA:  Chest pain  EXAM: MYOCARDIAL IMAGING WITH SPECT (REST)   TECHNIQUE: Standard myocardial SPECT imaging was performed after resting intravenous injection of 10 mCi Tc-34m sestamibi. Quantitative gated imaging was also performed to evaluate left ventricular wall motion, and estimate left ventricular ejection fraction.  COMPARISON:  None.  FINDINGS: SPECT imaging without gating was performed. There is moderately diminished activity throughout the inferior wall. There is also a perfusion defect at the apex and a final focal mid anteroseptal perfusion defect.  IMPRESSION: Rest imaging only without gating was performed. There is thinning of the inferior wall. There are focal defects at the apex and mid anteroseptal regions.   Electronically Signed   By: Maryclare Bean M.D.   On: 08/11/2013 11:22   Dg Chest Port 1 View  08/12/2013   CLINICAL DATA:  Followup pulmonary edema.  Status post dialysis.  EXAM: PORTABLE CHEST - 1 VIEW  COMPARISON:  08/11/13  FINDINGS: Asymmetric pulmonary edema shows mild improvement since previous study. Probable small pleural effusions again noted, however there is improved aeration in both lung bases. Heart  size is stable.  IMPRESSION: Mild interval improvement in diffuse pulmonary edema. Persistent small pleural effusions.   Electronically Signed   By: Earle Gell M.D.   On: 08/12/2013 13:12   Dg Chest Port 1 View  08/07/2013   CLINICAL DATA:  Shortness of breath, pneumonia  EXAM: PORTABLE CHEST - 1 VIEW  COMPARISON:  DG CHEST 1V PORT dated 08/06/2013; DG CHEST 1V PORT dated 08/02/2013; DG CHEST 2 VIEW dated 04/25/2013; DG CHEST 1V PORT dated 02/16/2013  FINDINGS: Grossly unchanged enlarged cardiac silhouette and mediastinal contours with atherosclerotic plaque within the thoracic aorta. The pulmonary vasculature is indistinct with cephalization of flow. Interval increase in now small layering bilateral effusions with associated worsening bibasilar opacities, left greater than right. No definite evidence of edema. Grossly unchanged bones including  presumed resection of the distal end of the right clavicle.  IMPRESSION: Worsening pulmonary edema, small layering bilateral effusions and associated bibasilar opacities, left greater than right, atelectasis versus infiltrate.   Electronically Signed   By: Sandi Mariscal M.D.   On: 08/07/2013 07:36   Dg Chest Port 1 View  08/06/2013   CLINICAL DATA:  68 year old male with shortness of breath, fever abdominal pain cyanosis. Initial encounter.  EXAM: PORTABLE CHEST - 1 VIEW  COMPARISON:  08/02/2013 and earlier.  FINDINGS: Portable AP semi upright view at 0612 hrs.  Extubated. Decreased bilateral veiling pulmonary opacity. Improved retrocardiac ventilation. Stable cardiac size and mediastinal contours. No pneumothorax. Stable pulmonary vascularity/interstitial opacity.  IMPRESSION: 1. Extubated. 2. Decreased appearance of bilateral pleural effusions and improved bibasilar ventilation. 3. Continued  pulmonary vascular congestion/interstitial edema.   Electronically Signed   By: Lars Pinks M.D.   On: 08/06/2013 06:31   Dg Chest Port 1 View  08/02/2013   CLINICAL DATA:  Respiratory failure.  EXAM: PORTABLE CHEST - 1 VIEW  COMPARISON:  DG CHEST 1V PORT dated 08/01/2013  FINDINGS: Endotracheal tube is approximately 3 cm above the carina. Degree of pulmonary edema at may be mildly improved. There are likely bilateral pleural effusions. The heart remains moderately enlarged.  IMPRESSION: Mild decrease in pulmonary edema.   Electronically Signed   By: Aletta Edouard M.D.   On: 08/02/2013 07:40   Dg Chest Port 1 View  08/01/2013   CLINICAL DATA:  Status post re-intubation  EXAM: PORTABLE CHEST - 1 VIEW  COMPARISON:  05/22/2013  FINDINGS: An endotracheal tube is seen 3.5 cm above the carinal. The cardiac shadow is prominent. Vascular congestion with mild posteriorly layering effusions is identified. A temporary dialysis catheter is not seen on this film. Correlation with the clinical findings is recommended.  IMPRESSION:  Vascular congestion and effusions which may be related to volume overload.  Endotracheal tube as described.   Electronically Signed   By: Inez Catalina M.D.   On: 08/01/2013 16:22   ASSESSMENT:  1. NSTEMI type II demand ischemia in the setting of influenza and sepsis  2. Acute combined systolic/diastolic CHF - post weight yesterday after HD 70kg and was 81kg on 2/15  3. Atrial flutter with RVR converted to NSR on 2/19  4. Moderate MR/TR  5. Mild to moderate LV dysfunction EF 40-45%  6. ESRD on HD MWF with PD catheter peritonitis and severe sepsis, afebrile 7. Influenza A, on tamiflu  8. Acute respiratory failure, improving  9. Anemia/thrombocytopenia/leukopenia - per IM - Hgb variable from 11-16 over February  10. Glottic squamous cell carcinoma s/p radiation  11. Severe protein-calorie malnutrition  12. COPD on nocturnal O2,  h/o trache placement  13. Renal mass by CT, possibly hemorrhagic cyst, recommend f/u CT 6 months per radiology note - will defer to IM  14. Ventricular tachycardia 13 beats  PLAN:  1. Nuclear stress test cancelled Friday due to increased lethargy and SOB with wheezing - he is much better today. Will try to get Lexiscan portion of stress test done today 2. His CHADS VASC score is high but this is the first time he has had afib/flutter and it was in the setting of acute influenza and respiratory failure with hypoxia. Would not anticoagulate at this time unless he has further problems with arrhythmias in the future. He has been weak at home and falling a lot so would be at increased risk for anticoagulation.        Sueanne Margarita, MD  08/13/2013  7:53 AM

## 2013-08-13 NOTE — Progress Notes (Signed)
Lexiscan CL performed 

## 2013-08-14 ENCOUNTER — Encounter (HOSPITAL_COMMUNITY)
Admission: EM | Disposition: A | Discharge status: Home / Self Care | Payer: Self-pay | Source: Home / Self Care | Attending: Internal Medicine

## 2013-08-14 DIAGNOSIS — I2510 Atherosclerotic heart disease of native coronary artery without angina pectoris: Secondary | ICD-10-CM

## 2013-08-14 DIAGNOSIS — R9439 Abnormal result of other cardiovascular function study: Secondary | ICD-10-CM

## 2013-08-14 HISTORY — PX: LEFT HEART CATHETERIZATION WITH CORONARY ANGIOGRAM: SHX5451

## 2013-08-14 LAB — HEPARIN INDUCED THROMBOCYTOPENIA PNL
Heparin Induced Plt Ab: NEGATIVE
Patient O.D.: 0.232
UFH HIGH DOSE UFH H: 0 %
UFH Low Dose 0.1 IU/mL: 0 % Release
UFH Low Dose 0.5 IU/mL: 0 % Release
UFH SRA Result: NEGATIVE

## 2013-08-14 LAB — PROTIME-INR
INR: 0.96 (ref 0.00–1.49)
Prothrombin Time: 12.6 seconds (ref 11.6–15.2)

## 2013-08-14 LAB — CBC
HCT: 40.3 % (ref 39.0–52.0)
Hemoglobin: 12.8 g/dL — ABNORMAL LOW (ref 13.0–17.0)
MCH: 30.1 pg (ref 26.0–34.0)
MCHC: 31.8 g/dL (ref 30.0–36.0)
MCV: 94.8 fL (ref 78.0–100.0)
PLATELETS: 154 10*3/uL (ref 150–400)
RBC: 4.25 MIL/uL (ref 4.22–5.81)
RDW: 18.7 % — AB (ref 11.5–15.5)
WBC: 4.8 10*3/uL (ref 4.0–10.5)

## 2013-08-14 LAB — BASIC METABOLIC PANEL
BUN: 30 mg/dL — ABNORMAL HIGH (ref 6–23)
CALCIUM: 8.3 mg/dL — AB (ref 8.4–10.5)
CO2: 25 mEq/L (ref 19–32)
CREATININE: 5.14 mg/dL — AB (ref 0.50–1.35)
Chloride: 97 mEq/L (ref 96–112)
GFR calc Af Amer: 12 mL/min — ABNORMAL LOW (ref >90)
GFR calc non Af Amer: 10 mL/min — ABNORMAL LOW (ref >90)
GLUCOSE: 87 mg/dL (ref 70–99)
Potassium: 3.8 mEq/L (ref 3.7–5.3)
SODIUM: 135 meq/L — AB (ref 137–147)

## 2013-08-14 LAB — GLUCOSE, CAPILLARY
Glucose-Capillary: 73 mg/dL (ref 70–99)
Glucose-Capillary: 79 mg/dL (ref 70–99)
Glucose-Capillary: 89 mg/dL (ref 70–99)

## 2013-08-14 SURGERY — LEFT HEART CATHETERIZATION WITH CORONARY ANGIOGRAM
Anesthesia: LOCAL

## 2013-08-14 MED ORDER — DOXERCALCIFEROL 4 MCG/2ML IV SOLN
INTRAVENOUS | Status: AC
  Administered 2013-08-14: 5 ug via INTRAVENOUS
  Filled 2013-08-14: qty 4

## 2013-08-14 MED ORDER — SODIUM CHLORIDE 0.9 % IV SOLN: 100.0000 mL | INTRAVENOUS | Status: DC | PRN

## 2013-08-14 MED ORDER — ALTEPLASE 2 MG IJ SOLR
2.0000 mg | Freq: Once | INTRAMUSCULAR | Status: DC | PRN
  Filled 2013-08-14: qty 2

## 2013-08-14 MED ORDER — NEPRO/CARBSTEADY PO LIQD
237.0000 mL | ORAL | Status: DC | PRN
  Filled 2013-08-14: qty 237

## 2013-08-14 MED ORDER — SODIUM CHLORIDE 0.9 % IV SOLN
250.0000 mL | INTRAVENOUS | Status: DC | PRN
Start: 2013-08-14 — End: 2013-08-14

## 2013-08-14 MED ORDER — MIDAZOLAM HCL 2 MG/2ML IJ SOLN
INTRAMUSCULAR | Status: AC
  Filled 2013-08-14: qty 2

## 2013-08-14 MED ORDER — SODIUM CHLORIDE 0.9 % IJ SOLN
3.0000 mL | Freq: Two times a day (BID) | INTRAMUSCULAR | Status: DC
  Administered 2013-08-14: 3 mL via INTRAVENOUS

## 2013-08-14 MED ORDER — LIDOCAINE HCL (PF) 1 % IJ SOLN
INTRAMUSCULAR | Status: AC
  Filled 2013-08-14: qty 30

## 2013-08-14 MED ORDER — LIDOCAINE HCL (PF) 1 % IJ SOLN: 5.0000 mL | INTRAMUSCULAR | Status: DC | PRN

## 2013-08-14 MED ORDER — HEPARIN (PORCINE) IN NACL 2-0.9 UNIT/ML-% IJ SOLN
INTRAMUSCULAR | Status: AC
  Filled 2013-08-14: qty 1000

## 2013-08-14 MED ORDER — HEPARIN SODIUM (PORCINE) 1000 UNIT/ML DIALYSIS: 1000.0000 [IU] | INTRAMUSCULAR | Status: DC | PRN

## 2013-08-14 MED ORDER — HYDROMORPHONE HCL PF 1 MG/ML IJ SOLN
INTRAMUSCULAR | Status: AC
  Filled 2013-08-14: qty 1

## 2013-08-14 MED ORDER — NITROGLYCERIN 0.2 MG/ML ON CALL CATH LAB
INTRAVENOUS | Status: AC
  Filled 2013-08-14: qty 1

## 2013-08-14 MED ORDER — SODIUM CHLORIDE 0.9 % IJ SOLN: 3.0000 mL | INTRAMUSCULAR | Status: DC | PRN

## 2013-08-14 MED ORDER — LORAZEPAM 1 MG PO TABS: 1.0000 mg | ORAL_TABLET | Freq: Once | ORAL | Status: DC

## 2013-08-14 MED ORDER — HEPARIN SODIUM (PORCINE) 1000 UNIT/ML DIALYSIS: 20.0000 [IU]/kg | INTRAMUSCULAR | Status: DC | PRN

## 2013-08-14 MED ORDER — LIDOCAINE-PRILOCAINE 2.5-2.5 % EX CREA
1.0000 "application " | TOPICAL_CREAM | CUTANEOUS | Status: DC | PRN
  Filled 2013-08-14: qty 5

## 2013-08-14 MED ORDER — SODIUM CHLORIDE 0.9 % IV SOLN: INTRAVENOUS | Status: DC

## 2013-08-14 MED ORDER — HYDROMORPHONE HCL PF 1 MG/ML IJ SOLN
INTRAMUSCULAR | Status: AC
  Administered 2013-08-14: 0.5 mg via INTRAVENOUS
  Filled 2013-08-14: qty 1

## 2013-08-14 MED ORDER — PENTAFLUOROPROP-TETRAFLUOROETH EX AERO: 1.0000 "application " | INHALATION_SPRAY | CUTANEOUS | Status: DC | PRN

## 2013-08-14 NOTE — CV Procedure (Signed)
CARDIAC CATHETERIZATION REPORT  NAME:  Victor Lane   MRN: [id number] DOB:  Feb 19, 1946   ADMIT DATE: 08/06/2013 Procedure Date: 08/14/2013  INTERVENTIONAL CARDIOLOGIST: Leonie Man, M.D., MS PRIMARY CARE PROVIDER: [name], MD PRIMARY CARDIOLOGIST: [facility] (listed cardiologist are Dr. Sallyanne Kuster and Dr. Radford Pax who does not know the patient well)  PATIENT:  Victor Lane is a 68 y.o. male extensive medical history including carotid artery disease along with other cardiovascular disease, end-stage renal disease, glottic surgery and recent critical illness in August of 2014 who presented on February 15 to the emergency room with abdominal pain, fever and weakness with suspected sepsis. As part of his evaluation cardiac enzymes were checked and his troponins were elevated. He was initially treated medically and once more stable underwent nuclear stress testing for risk stratification. This showed significant inferior and inferolateral ischemia. He also has reduced ejection fraction. Therefore referred for cardiac catheterization. Upon arrival to the catheterization lab, the patient mostly complaining of right-sided chest wall pain that was either musculoskeletal or pleuritic in nature. Does not complaining any anginal pain.  PRE-OPERATIVE DIAGNOSIS:    Recent non-STEMI  Intermediate to High-Risk Myoview Stress Test  PROCEDURES PERFORMED:    Left Heart Catheterization with Coronary Angiography  Direct Ultrasound Guided Arterial Access  PROCEDURE:Consent:  Risks of procedure as well as the alternatives and risks of each were explained to the (patient/caregiver).  Consent for procedure obtained. Consent for signed by MD and patient with RN witness -- placed on chart.   PROCEDURE: The patient was brought to the [facility] in the fasting state and prepped and draped in the usual sterile fashion for Right groin access. Radial access was  not an option based on him being end-stage renal disease..  Sterile technique was used including antiseptics, cap, gloves, gown, hand hygiene, mask and sheet.  Skin prep: Chlorhexidine.  Time Out: Verified patient identification, verified procedure, site/side was marked, verified correct patient position, special equipment/implants available, medications/allergies/relevent history reviewed, required imaging and test results available.  Performed  Access (difficult): Right Common Femoral Artery; 5 Fr Sheath -- direct ultrasound and fluoroscopically guided modified Seldinger technique   After several attempts with the one wall needle as well as the ultrasound SmartNeedle were unsuccessful, the decision was made to use the soft site ultrasound which revealed extensive calcification in the femoral bifurcation and distal common femoral artery. Ultrasound was used to ensure access above the most significant level of calcification, and just below the inguinal ligament. Diagnostic Left Heart Catheterization:  5 Fr JL , JR 4, and Angled Pigtail catheters advanced and exchanged over a J-wire.   Left Coronary Artery Angiography: JL4  Right Coronary Artery Angiography: JR 4  LV Hemodynamics: Angled pigtail  Sheath:  Removed in cath lab with manual pressure held for hemostasis.  Hemodynamics:  Central Aortic / Mean Pressures: 113/51 mmHg; 75 mmHg  Left Ventricular Pressures / EDP: 120/4 mmHg; 5 mmHg  Left Ventriculography: Not performed  Recent echocardiogram showed EF of 40-45% a nuclear stress test showed EF of 38%  Coronary Anatomy: Moderate to severe calcification of the proximal Left Coronary System.  Left Main: Short large-caliber vessel that bifurcates into the LAD and Circumflex. Angiographically normal. LAD: Begin a large caliber vessel tapers to a moderate the proximal segment. There are 2 small-moderate caliber diagonal branches that are relatively normal. Distally, well beyond D2 there  is a focal roughly 70-80% stenosis with brisk downstream flow.  Left  Circumflex: Large-caliber vessel with proximal eccentric 60% calcified stenosis after which the vessel bifurcates into OM 1, and the AV groove circumflex. The AV groove circumflex terminates in a small OM 2 and the posterolateral vessel. Just at this bifurcation there is a focal 80% stenosis in a 1.5-2 mm vessel.  OM1: Moderate caliber vessel with calcification and mild luminal irregularities proximally, then bifurcates distally near the inferolateral apex.  OM 2: Relatively small caliber vessel, diffusely diseased. Ramus intermedius: Small-caliber vessel with mid 90% stenosis. Less than 2 mm  RCA: Small to moderate caliber, dominant vessel with diffuse disease throughout the mid vessel and then 100% occluded after the major RV marginal branch. The RV marginal branch itself has a focal 90% stenosis as well. There are bridging collaterals until distally faintly enough to see flow down a posterior lateral vessel as well as a posterior descending artery.    RPDA: Grade 1 collaterals from septal perforators and distal LAD.  RPL Sysytem: Minimal filling from circumflex collaterals.  After reviewing the initial angiography, the culprit lesion was thought to be mid occlusion of the RCA which appears to be chronic. There is also the distal LAD lesion.    MEDICATIONS:  Anesthesia:  Local Lidocaine 18 ml  Sedation:  2 mg IV Versed, 1 mg IV Dilaudid   Omnipaque Contrast:  70 ml  Intracoronary Nitroglycerin 200 mcg  PATIENT DISPOSITION:    The patient was transferred to the PACU holding area in a hemodynamicaly stable, chest pain free condition.  The patient tolerated the procedure well, and there were no complications.  EBL:   < 20 ml  The patient was stable before, during, and after the procedure.  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    The distal LAD lesion, does angiographically appeared to be potentially significant, however there was no evidence of ischemia in the apex, making it less likely to be physiologically significant in the absence of anginal symptoms.  Moderate disease noted in the circumflex, with a more significant lesion in the small Ramus Intermedius  Relatively normal LVEDP with mild-moderately reduced LVEF by noninvasive imaging.  Multiple comorbidities making him a less than appealing PCI candidate at this particular juncture.  PLAN OF CARE:  Return to nursing floor for standard post catheterization care.  He has had atrial fibrillation while in the hospital, I will restart heparin 8 hours post sheath removal.  Recommendation at this particular time would be to optimize medical therapy of the existing Coronary Artery Disease until he is fully recovered from his recent critical illness. If in the future he were to demonstrate clear signs of ischemic symptoms, LAD PCI to be considered.  Recommend clinical correlation with the patient's symptoms and ongoing illness/comorbidities to determine the best course of action going forward. One questioned as to be his ability of dual antiplatelet therapy.  Leonie Man, M.D., M.S. [facility] [address] [place], [place]  [address]  [phone]  08/14/2013 7:44 PM

## 2013-08-14 NOTE — Progress Notes (Signed)
[facility] PROGRESS NOTE  Victor Lane JYY:[id number] DOB: 1945/09/06 DOA: 08/06/2013 PCP: Lenora Boys, MD  Assessment/Plan: Acute on chronic respiratory failure  -Patient remains hypoxemic with oxygen saturation in the low 90s-->increased nasal cannula to 4 L on 2/20--now stable on 3L  -secondary to Bilateral pleural effusions/fluid overload  -weight 81.8kg on 2/115/15-->70.9kg on 08/12/13  -08/11/13 Repeat chest x-ray revealed persistent interstitial edema with small b/l pleural effusions  -COPD on nocturnal O2 w/out exacerbation  -Oxygenation improving with fluid removal -Albuterol / Ipratropium prn  Acute on chronic systolic CHF  -EF 40-45% on 08/07/13 echo  -Nuclear stress test canceled yesterday (08/11/2013)  -SPECT scan without gated study showed focal defects in the apex and anteroseptal region  -LexiScan 08/13/13--markedly abnormal showingeversible myocardial perfusion defects at the inferior/inferolateral and anteroseptal walls of the left ventricle. Mildly decreased left ventricular ejection fraction of 38%. No focal wall motion abnormalities.  -heart catheterization planned -oxygenating better with fluid removal Paroxysmal atrial flutter  -Developed on the evening of 08/09/2013  -Spontaneous conversion to sinus rhythm after Cardizem drip  -Continue metoprolol tartrate--remained in sinus rhythm  -CHADSVASc= 4  -defer anticoagulation decision to cardiology-->antiplatelets -Defer chronotropic agent management to cardiology  Elevated troponin:  -Most likely from demand ischemia/NSTEMI -ischemic workup in progress -Echocardiogram EF 40-45%, grade 2 diastolic dysfunction, no WMA  -trop (1.31 max)  -Appreciate cardiology followup  --LexiScan 08/13/13--markedly abnormal showingeversible myocardial perfusion defects at the inferior/inferolateral and anteroseptal walls of the left ventricle. Mildly decreased left ventricular ejection fraction of 38%. No focal wall motion  abnormalities.  Severe sepsis  -improved  -Blood and urine cultures are negative  -continue IV vanco  -d/c aztreonam and Flagyl  -start ceftazidime 2 grams with HD  -plan antibiotics on HD until 08/20/13  ESRD on HD - MWF  -Try to maintain CAPD catheter as long as the patient clinically improves  -HD per renal  -Trend BMP  Peritonitis  -S/p PD cath placement 2/10: Flushing well, healing well externally (? Extension tubing pulled off at home)  -Clinically improving without any further abdominal pain  -Continue vancomycin, ceftazidime-->plan antibiotics on HD until 08/20/13  Flu A  -added tamiflu--started 08/08/2013  -Continue oseltamivir through 08/12/2013  Thrombocytopenia/leukopenia  -Platelets appear to be gradually improving  -likely due to influenza although HIT is consideration  -HIT panel--pending  -other culprits include meds--protonix, vanco  -continue to trend  DM  -intermitten Hypoglycemia due to glipizide  -SSI  -CBG 69-104; D50 IV as needed for hypoglycemia  -Hold Glipizide  -hemoglobin A1c--6.3  Lower extremity edema  -Left greater than right  -Venous duplex rule out DVT--negative for DVT  Renal Mass  -Left renal 1.1 cm hyperdensity likely represents hemorrhagic cyst  -Needs a 6 month surveillance CT  Family Communication: wife updated on phone  Disposition Plan: Home when medically stable  Antibiotics:  Vancomycin 08/06/2013>>>  Ceftazidime 08/09/2013>>>  Aztreonam 08/06/2013>>> 08/09/2013  Flagyl 08/06/13>>>08/09/13        Procedures/Studies: Ct Abdomen Pelvis Wo Contrast  08/06/2013   CLINICAL DATA:  Abdominal pain and fever. Recent surgical procedure for peritoneal dialysis 5 days ago.  EXAM: CT ABDOMEN AND PELVIS WITHOUT CONTRAST  TECHNIQUE: Multidetector CT imaging of the abdomen and pelvis was performed following the standard protocol without intravenous contrast.  COMPARISON:  None.  FINDINGS: Lung bases demonstrate a moderate size right pleural  effusion and small left pleural effusion with associated airspace opacification which may be due to atelectasis or infection. There is mild cardiomegaly.  Abdominal images  demonstrate evidence of a prior cholecystectomy. There is a small amount of perihepatic fluid. There is mild fatty infiltration of the liver with subtle nodular contour. The pancreas and adrenal glands are unremarkable. Appendix is within normal. Kidneys are normal in size with bilateral renal cortical thinning. There is a 6 cm exophytic simple cyst over the mid pole of the left kidney. There are a few small of bilateral hypodensities likely cysts. There is a 1.1 cm homogeneously hyperdense mass over the lower pole of the left kidney with Hounsfield unit measurements 45 likely a hemorrhagic cyst. Ureters are within normal. There is no hydronephrosis or nephrolithiasis. There is diverticulosis of the colon most prominent over the sigmoid colon. There is a peritoneal dialysis catheter entering the left mid abdomen with tip coiled over the pelvis between loops of rectosigmoid colon. There is moderate calcified plaque of the thoracoabdominal aorta and iliac arteries. Minimal free fluid over the right pericolic gutter. No evidence of free peritoneal air.  Remaining pelvic structures are notable only for moderate fecal retention over the rectosigmoid colon. There is mild degenerative change of the spine and hips.  IMPRESSION: Moderate size right pleural effusion and small left pleural effusion with associated airspace opacification in the lower lobes which may be due to pneumonia or atelectasis.  Peritoneal dialysis catheter with tip coiled over the pelvis between several recto sigmoid colonic loops.  Changes involving the liver which may be due to cirrhosis. Small amount of ascites.  Bilateral renal cortical thinning and bilateral renal cysts. 1.1 cm homogeneously hyperdense left renal mass likely a hemorrhagic cyst. Recommend followup CT 6 months.   Diverticulosis of the colon. Moderate fecal retention over the rectosigmoid colon.  Small hiatal hernia.   Electronically Signed   By: Elberta Fortis M.D.   On: 08/06/2013 07:57   Dg Chest 2 View  08/11/2013   CLINICAL DATA:  Pulmonary edema.  EXAM: CHEST  2 VIEW  COMPARISON:  August 07, 2013.  FINDINGS: Stable cardiomegaly. Continued presence of interstitial densities are noted throughout both lungs most consistent with pulmonary edema or less likely pneumonia which is not significantly changed. Mild bilateral pleural effusions are noted. No pneumothorax is noted. Bony thorax is intact.  IMPRESSION: Stable bilateral lung opacities are noted most consistent with pulmonary edema, although pneumonia cannot be excluded. Mild bilateral pleural effusions are noted.   Electronically Signed   By: Roque Lias M.D.   On: 08/11/2013 10:46   Nm Myocar Single W/spect W/wall Motion And Ef  08/13/2013   CLINICAL DATA:  Chest pain, history hypertension, COPD, diabetes, end-stage renal disease on dialysis, vocal cord cancer post radiation therapy, carotid vascular disease  EXAM: MYOCARDIAL IMAGING WITH SPECT (REST AND PHARMACOLOGIC-STRESS - 2 DAY PROTOCOL)  GATED LEFT VENTRICULAR WALL MOTION STUDY  LEFT VENTRICULAR EJECTION FRACTION  TECHNIQUE: Standard myocardial SPECT imaging was performed after resting intravenous injection of 10 mCi Tc-39m sestamibi. Subsequently, on a second day, intravenous infusion of Lexiscan was performed under the supervision of the Cardiology staff. At peak effect of the drug, 30 mCi Tc-69m sestamibi was injected intravenously and standard myocardial SPECT imaging was performed. Quantitative gated imaging was also performed to evaluate left ventricular wall motion, and estimate left ventricular ejection fraction.  COMPARISON:  None.  FINDINGS: Myocardial profusion SPECT images obtained following pharmacologic stress demonstrate dilatation of the left ventricle.  Large perfusion defect identified  at inferior/inferolateral wall left ventricle.  Small perfusion defect identified at anteroseptal wall left ventricle.  Resting exam demonstrates reperfusion  of both of these defects.  No pulmonary uptake of tracer.  Mildly decreased left ventricular ejection fraction of 38% is calculated on gated SPECT images following pharmacologic stress.  This is derived from an end-diastolic volume calculation of 162 mL and an end systolic volume of 379 mL.  Wall motion analysis shows no focal wall motion abnormality.  IMPRESSION: Reversible myocardial perfusion defects at the inferior/inferolateral and anteroseptal walls of the left ventricle.  Mildly decreased left ventricular ejection fraction of 38%.  No focal wall motion abnormalities.   Electronically Signed   By: Lavonia Dana M.D.   On: 08/13/2013 14:22   Nm Myocar Single W/spect W/wall Motion And Ef  08/11/2013   CLINICAL DATA:  Chest pain  EXAM: MYOCARDIAL IMAGING WITH SPECT (REST)  TECHNIQUE: Standard myocardial SPECT imaging was performed after resting intravenous injection of 10 mCi Tc-64m sestamibi. Quantitative gated imaging was also performed to evaluate left ventricular wall motion, and estimate left ventricular ejection fraction.  COMPARISON:  None.  FINDINGS: SPECT imaging without gating was performed. There is moderately diminished activity throughout the inferior wall. There is also a perfusion defect at the apex and a final focal mid anteroseptal perfusion defect.  IMPRESSION: Rest imaging only without gating was performed. There is thinning of the inferior wall. There are focal defects at the apex and mid anteroseptal regions.   Electronically Signed   By: Maryclare Bean M.D.   On: 08/11/2013 11:22   Dg Chest Port 1 View  08/12/2013   CLINICAL DATA:  Followup pulmonary edema.  Status post dialysis.  EXAM: PORTABLE CHEST - 1 VIEW  COMPARISON:  08/11/13  FINDINGS: Asymmetric pulmonary edema shows mild improvement since previous study. Probable small pleural  effusions again noted, however there is improved aeration in both lung bases. Heart size is stable.  IMPRESSION: Mild interval improvement in diffuse pulmonary edema. Persistent small pleural effusions.   Electronically Signed   By: Earle Gell M.D.   On: 08/12/2013 13:12   Dg Chest Port 1 View  08/07/2013   CLINICAL DATA:  Shortness of breath, pneumonia  EXAM: PORTABLE CHEST - 1 VIEW  COMPARISON:  DG CHEST 1V PORT dated 08/06/2013; DG CHEST 1V PORT dated 08/02/2013; DG CHEST 2 VIEW dated 04/25/2013; DG CHEST 1V PORT dated 02/16/2013  FINDINGS: Grossly unchanged enlarged cardiac silhouette and mediastinal contours with atherosclerotic plaque within the thoracic aorta. The pulmonary vasculature is indistinct with cephalization of flow. Interval increase in now small layering bilateral effusions with associated worsening bibasilar opacities, left greater than right. No definite evidence of edema. Grossly unchanged bones including presumed resection of the distal end of the right clavicle.  IMPRESSION: Worsening pulmonary edema, small layering bilateral effusions and associated bibasilar opacities, left greater than right, atelectasis versus infiltrate.   Electronically Signed   By: Sandi Mariscal M.D.   On: 08/07/2013 07:36   Dg Chest Port 1 View  08/06/2013   CLINICAL DATA:  68 year old male with shortness of breath, fever abdominal pain cyanosis. Initial encounter.  EXAM: PORTABLE CHEST - 1 VIEW  COMPARISON:  08/02/2013 and earlier.  FINDINGS: Portable AP semi upright view at 0612 hrs.  Extubated. Decreased bilateral veiling pulmonary opacity. Improved retrocardiac ventilation. Stable cardiac size and mediastinal contours. No pneumothorax. Stable pulmonary vascularity/interstitial opacity.  IMPRESSION: 1. Extubated. 2. Decreased appearance of bilateral pleural effusions and improved bibasilar ventilation. 3. Continued  pulmonary vascular congestion/interstitial edema.   Electronically Signed   By: Lars Pinks M.D.    On: 08/06/2013 06:31  Dg Chest Port 1 View  08/02/2013   CLINICAL DATA:  Respiratory failure.  EXAM: PORTABLE CHEST - 1 VIEW  COMPARISON:  DG CHEST 1V PORT dated 08/01/2013  FINDINGS: Endotracheal tube is approximately 3 cm above the carina. Degree of pulmonary edema at may be mildly improved. There are likely bilateral pleural effusions. The heart remains moderately enlarged.  IMPRESSION: Mild decrease in pulmonary edema.   Electronically Signed   By: Aletta Edouard M.D.   On: 08/02/2013 07:40   Dg Chest Port 1 View  08/01/2013   CLINICAL DATA:  Status post re-intubation  EXAM: PORTABLE CHEST - 1 VIEW  COMPARISON:  05/22/2013  FINDINGS: An endotracheal tube is seen 3.5 cm above the carinal. The cardiac shadow is prominent. Vascular congestion with mild posteriorly layering effusions is identified. A temporary dialysis catheter is not seen on this film. Correlation with the clinical findings is recommended.  IMPRESSION: Vascular congestion and effusions which may be related to volume overload.  Endotracheal tube as described.   Electronically Signed   By: Inez Catalina M.D.   On: 08/01/2013 16:22         Subjective: Patient is doing better. Denies any fevers, chills, chest discomfort, shortness of breath, nausea, vomiting, diarrhea. Abdominal pain has improved. No headache or dizziness.  Objective: Filed Vitals:   08/14/13 0930 08/14/13 1000 08/14/13 1030 08/14/13 1100  BP: 144/61 141/62 133/57 134/57  Pulse: 88 85 85 82  Temp:      TempSrc:      Resp: $Remo'20 15 15 15  'efGMJ$ Height:      Weight:      SpO2: 100% 100% 100%     Intake/Output Summary (Last 24 hours) at 08/14/13 1126 Last data filed at 08/13/13 2030  Gross per 24 hour  Intake    680 ml  Output      0 ml  Net    680 ml   Weight change: 1.9 kg (4 lb 3 oz) Exam:   General:  Pt is alert, follows commands appropriately, not in acute distress  HEENT: No icterus, No thrush, [place]/AT  Cardiovascular: RRR, S1/S2, no rubs, no  gallops  Respiratory: Bibasilar crackles. No wheezing. Good air movement.  Abdomen: Soft/+BS, non tender, non distended, no guarding  Extremities: No edema, No lymphangitis, No petechiae, No rashes, no synovitis  Data Reviewed: Basic Metabolic Panel:  Recent Labs Lab 08/08/13 1504 08/09/13 0813 08/10/13 0840 08/11/13 1238 08/12/13 0712 08/14/13 0900  NA 137 132* 129* 133* 134* 135*  K 4.0 5.9* 3.4* 3.7 3.6* 3.8  CL 95* 94* 91* 92* 95* 97  CO2 $Re'26 26 24 23 24 25  'iQt$ GLUCOSE 120* 98 232* 103* 100* 87  BUN 25* 20 18 34* 18 30*  CREATININE 3.94* 3.55* 3.10* 4.75* 3.40* 5.14*  CALCIUM 8.6 8.4 8.0* 8.4 8.4 8.3*  PHOS 3.3 3.8  --  3.2 2.1*  --    Liver Function Tests:  Recent Labs Lab 08/08/13 1504 08/09/13 0813 08/11/13 1238 08/12/13 0712  ALBUMIN 2.5* 2.4* 2.3* 2.1*   No results found for this basename: LIPASE, AMYLASE,  in the last 168 hours No results found for this basename: AMMONIA,  in the last 168 hours CBC:  Recent Labs Lab 08/09/13 0857 08/10/13 0840 08/11/13 0235 08/12/13 0711 08/14/13 0415  WBC 2.4* 3.5* 5.0 4.1 4.8  HGB 11.1* 12.0* 12.4* 13.3 12.8*  HCT 35.2* 37.4* 38.8* 40.6 40.3  MCV 96.4 96.1 94.9 93.8 94.8  PLT 95* 110* 122* 135* 154  Cardiac Enzymes: No results found for this basename: CKTOTAL, CKMB, CKMBINDEX, TROPONINI,  in the last 168 hours BNP: No components found with this basename: POCBNP,  CBG:  Recent Labs Lab 08/12/13 2057 08/13/13 0617 08/13/13 1306 08/13/13 1635 08/13/13 2054  GLUCAP 136* 113* 135* 121* 150*    Recent Results (from the past 240 hour(s))  CULTURE, BLOOD (ROUTINE X 2)     Status: None   Collection Time    08/06/13  5:55 AM      Result Value Ref Range Status   Specimen Description BLOOD FOREARM RIGHT   Final   Special Requests BOTTLES DRAWN AEROBIC AND ANAEROBIC 6ML   Final   Culture  Setup Time     Final   Value: 08/06/2013 13:29     Performed at [facility]   Culture     Final   Value: NO  GROWTH 5 DAYS     Performed at [facility]   Report Status 08/13/2013 FINAL   Final  CULTURE, BLOOD (ROUTINE X 2)     Status: None   Collection Time    08/06/13  6:30 AM      Result Value Ref Range Status   Specimen Description BLOOD RIGHT FOREARM   Final   Special Requests BOTTLES DRAWN AEROBIC AND ANAEROBIC [facility]   Final   Culture  Setup Time     Final   Value: 08/06/2013 17:59     Performed at [facility]   Culture     Final   Value: NO GROWTH 5 DAYS     Performed at [facility]   Report Status 08/12/2013 FINAL   Final  MRSA PCR SCREENING     Status: None   Collection Time    08/06/13 10:42 AM      Result Value Ref Range Status   MRSA by PCR NEGATIVE  NEGATIVE Final   Comment:            The GeneXpert MRSA Assay (FDA     approved for NASAL specimens     only), is one component of a     comprehensive MRSA colonization     surveillance program. It is not     intended to diagnose MRSA     infection nor to guide or     monitor treatment for     MRSA infections.  RESPIRATORY VIRUS PANEL     Status: Abnormal   Collection Time    08/06/13 11:20 AM      Result Value Ref Range Status   Source - RVPAN NASOPHARYNGEAL   Final   Respiratory Syncytial Virus A NOT DETECTED   Final   Respiratory Syncytial Virus B NOT DETECTED   Final   Influenza A DETECTED (*)  Final   Influenza B NOT DETECTED   Final   Parainfluenza 1 NOT DETECTED   Final   Parainfluenza 2 NOT DETECTED   Final   Parainfluenza 3 NOT DETECTED   Final   Metapneumovirus NOT DETECTED   Final   Rhinovirus NOT DETECTED   Final   Adenovirus NOT DETECTED   Final   Influenza A H1 NOT DETECTED   Final   Influenza A H3 DETECTED (*)  Final   Comment: (NOTE)           Normal Reference Range for each Analyte: NOT DETECTED     Testing performed using the Luminex xTAG Respiratory Viral Panel test     kit.  This test was developed and its performance characteristics determined     by [employer]. It has not been cleared or approved by the [place]     Food and Drug Administration. This test is used for clinical purposes.     It should not be regarded as investigational or for research. This     laboratory is certified under the [place] (CLIA) as qualified to perform high complexity     clinical laboratory testing.     Performed at [facility]     Status: None   Collection Time    08/07/13  6:55 PM      Result Value Ref Range Status   Specimen Description URINE, RANDOM   Final   Special Requests NONE   Final   Culture  Setup Time     Final   Value: 08/07/2013 20:25     Performed at [place]     Final   Value: NO GROWTH     Performed at [facility]   Culture     Final   Value: NO GROWTH     Performed at [facility]   Report Status 08/08/2013 FINAL   Final     Scheduled Meds: . cefTAZidime (FORTAZ)  IV  2 g Intravenous Q M,W,F-HD  . doxercalciferol  5 mcg Intravenous Q M,W,F-HD  . heparin  5,000 Units Subcutaneous 3 times per day  . insulin aspart  0-5 Units Subcutaneous QHS  . insulin aspart  0-9 Units Subcutaneous TID WC  . lip balm  1 application Topical BID  . metoprolol tartrate  12.5 mg Oral BID  . multivitamin  1 tablet Oral QHS  . pantoprazole  40 mg Oral Daily  . polyethylene glycol  17 g Oral BID  . saccharomyces boulardii  250 mg Oral BID  . vancomycin  750 mg Intravenous Q M,W,F-HD   Continuous Infusions:    Geral Coker, DO  [facility] Pager [phone]  If 7PM-7AM, please contact night-coverage [email or web address] Password [facility] 08/14/2013, 11:26 AM   LOS: 8 days

## 2013-08-14 NOTE — Progress Notes (Signed)
UR Completed.  Annaliza Zia Jane [phone] 08/14/2013  

## 2013-08-14 NOTE — Progress Notes (Signed)
Pt transported to cath lab by cath lab staff at this time.

## 2013-08-14 NOTE — Progress Notes (Signed)
Subjective: The patient is extremely anxious.  He is complaining about everything including the bed.  He had severe right and left rib pain.  Objective: Vital signs in last 24 hours: Temp:  [97.4 F (36.3 C)-97.9 F (36.6 C)] 97.5 F (36.4 C) (02/23 1245) Pulse Rate:  [82-113] 113 (02/23 1245) Resp:  [13-20] 16 (02/23 1245) BP: (113-144)/(49-71) 113/66 mmHg (02/23 1245) SpO2:  [96 %-100 %] 99 % (02/23 1245) Weight:  [154 lb 1.6 oz (69.9 kg)-162 lb 4.1 oz (73.6 kg)] 154 lb 1.6 oz (69.9 kg) (02/23 1200) Last BM Date: 08/12/13  Intake/Output from previous day: 02/22 0701 - 02/23 0700 In: 680 [P.O.:680] Out: -  Intake/Output this shift: Total I/O In: -  Out: 3500 [Other:3500]  Medications Current Facility-Administered Medications  Medication Dose Route Frequency Provider Last Rate Last Dose  . acetaminophen (TYLENOL) suppository 650 mg  650 mg Rectal Q6H PRN [name], MD      . acetaminophen (TYLENOL) tablet 325-650 mg  325-650 mg Oral Q6H PRN [name], MD   650 mg at 08/11/13 2216  . albuterol (PROVENTIL) (2.5 MG/3ML) 0.083% nebulizer solution 2.5 mg  2.5 mg Nebulization Q2H PRN Tammy S Parrett, NP      . bisacodyl (DULCOLAX) suppository 10 mg  10 mg Rectal Q12H PRN [name], MD      . cefTAZidime (FORTAZ) 2 g in dextrose 5 % 50 mL IVPB  2 g Intravenous Q M,W,F-HD Orson Eva, MD   2 g at 08/14/13 1131  . doxercalciferol (HECTOROL) injection 5 mcg  5 mcg Intravenous Q M,W,F-HD Alvia Grove, PA-C   5 mcg at 08/14/13 2355  . heparin injection 5,000 Units  5,000 Units Subcutaneous 3 times per day Melvenia Needles, NP   5,000 Units at 08/14/13 7322  . HYDROmorphone (DILAUDID) injection 0.5 mg  0.5 mg Intravenous Q4H PRN Mariea Clonts, MD   0.5 mg at 08/14/13 0254  . hydrOXYzine (ATARAX/VISTARIL) tablet 25 mg  25 mg Oral TID PRN Orson Eva, MD      . insulin aspart (novoLOG) injection 0-5 Units  0-5 Units Subcutaneous QHS Jessica U Vann, DO      . insulin  aspart (novoLOG) injection 0-9 Units  0-9 Units Subcutaneous TID WC Geradine Girt, DO   1 Units at 08/13/13 1747  . ipratropium-albuterol (DUONEB) 0.5-2.5 (3) MG/3ML nebulizer solution 3 mL  3 mL Nebulization Q6H PRN Orson Eva, MD      . lactulose (CHRONULAC) 10 GM/15ML solution 20 g  20 g Oral BID PRN [name], MD      . lip balm (BLISTEX) ointment 1 application  1 application Topical BID [name], MD   1 application at 27/06/23 1000  . magic mouthwash  15 mL Oral QID PRN [name], MD   15 mL at 08/13/13 0233  . metoprolol tartrate (LOPRESSOR) tablet 12.5 mg  12.5 mg Oral BID Orson Eva, MD   12.5 mg at 08/14/13 1246  . multivitamin (RENA-VIT) tablet 1 tablet  1 tablet Oral QHS Alvia Grove, PA-C   1 tablet at 08/13/13 2234  . pantoprazole (PROTONIX) EC tablet 40 mg  40 mg Oral Daily Tammy S Parrett, NP   40 mg at 08/13/13 1319  . polyethylene glycol (MIRALAX / GLYCOLAX) packet 17 g  17 g Oral BID [name], MD   17 g at 08/12/13 2217  . saccharomyces boulardii (FLORASTOR) capsule 250 mg  250 mg Oral BID [name], MD   250 mg at 08/14/13 1246  . vancomycin (VANCOCIN) IVPB 750 mg/150 ml premix  750 mg Intravenous Q M,W,F-HD Orson Eva, MD   750 mg at 08/11/13 1539    PE: General appearance: alert, cooperative and moderate distress Lungs: clear to auscultation bilaterally Heart: regular rate and rhythm, S1, S2 normal, no murmur, click, rub or gallop Chest:  Ribs on right- mid axillary line extremely tender with light palpation. Abdomen: Nontender.   Extremities: No LEE Pulses: 1+ Skin: Warm and dry Neurologic: Grossly normal.  Lab Results:   Recent Labs  08/12/13 0711 08/14/13 0415  WBC 4.1 4.8  HGB 13.3 12.8*  HCT 40.6 40.3  PLT 135* 154   BMET  Recent Labs  08/12/13 0712 08/14/13 0900  NA 134* 135*  K 3.6* 3.8  CL 95* 97  CO2 24 25  GLUCOSE 100* 87  BUN 18 30*  CREATININE 3.40* 5.14*  CALCIUM 8.4 8.3*    Assessment/Plan     Principal Problem:   Severe sepsis(995.92) Active Problems:   Glottis carcinoma   Acute respiratory failure   Diabetes mellitus   HTN (hypertension)   ESRD (end stage renal disease) on dialysis   Anemia of chronic disease   Protein-calorie malnutrition, severe   Other malaise and fatigue   COPD GOLD II   Peritonitis   HCAP (healthcare-associated pneumonia)   CAPD (continuous ambulatory peritoneal dialysis) catheter in place   Elevated troponin level   Atrial flutter   Valvular heart disease - moderate AI, moderate TR, mild MR by echo 08/07/13   Acute on chronic CHF with newly recognized systolic dysfunction EF 62-13%   Thrombocytopenia   Leukopenia  Plan:   Positive nuclear stress test.  Left heart cath today. The patient is extremely anxious.   I am notr sure what to make of his rib pain.  It really seams out of proportion to the light touch during exam.   Dilaudid given for pain.   Will need ativan prior to cath.  BP stable.  Maintaining NSR.    I spoke to the patient's wife on the phone and gave her an update.    LOS: 8 days    Avel Ogawa 08/14/2013 1:19 PM

## 2013-08-14 NOTE — Progress Notes (Signed)
No orders for cath at this time; cardiology PA to see pt soon; will make pt aware.

## 2013-08-14 NOTE — Progress Notes (Signed)
Dr. Radford Pax not in hospital today; PA paged regarding poss cath today; will await callback.

## 2013-08-14 NOTE — Interval H&P Note (Signed)
History and Physical Interval Note:  08/14/2013 7:15 PM  68 year old with extensive PMH including ESRD/stage 4 CKD with HD on M/Lane/F, Lane/ PD catheter placement on 2/10 . Presented to ER on 2/15 am with fever, abd pain , weakness. B/P marginal ~106, tachycardia. Lactate 13 . Felt to have SIRS/Sepsis . CT abd /pelvis showed mod right pleural effusion. No acute abd process noted.  PCCM was asked to admit. Was admitted 2/10 for a PD cath placement, had difficulty post op with inability to protect airway Lane/ reintubation requiring vent support overnight, He received HD and was extubated Lane/ discharge home on 2/11.  In ER Pt was given 2 L/ bolus Lane/ improved b/p and decreased HR. Pt has an extensive hx with prolonged critical illness 01/2013 Lane/ Acute resp failrue requiring trach now decannulated. He lives at home with wife who is also ill.  Says he has been very weak at home , fell yesterday after tripping on shoes. Says he is short of breath Lane/ no energy, woke up with abd pain. PD cath was draining clear liquid. Has had chills and fever since yesterday. Has diffuse abd pain. He will be admitted to ICU for further treatment and evaluation. He was noted to have elevated troponin and now Cardiology is asked to consult. He has no prior cardiac problems. He had some CP in 2012 and nuclear stress test showed no ischemia and echo showed normal LVF with mildly dilated aortic root. He does have a history of PVD with carotid artery disease.   He was initially found to be in Severe Sepsis - thought to be related to Acute Influenza, on Tamiflu.  He had + Troponin levels last week & was evaluated with a Myoview ST that showed inferolateral ischemia with reduced EF of ~38%. He is now referred for [facility] +/- PCI.   Victor Lane  has presented today for surgery, with the diagnosis of RECENT NSTEMI with Abnormal Stress Test  The various methods of treatment have been discussed with the patient and family. After consideration of  risks, benefits and other options for treatment, the patient has consented to  Procedure(s): LEFT HEART CATHETERIZATION WITH CORONARY ANGIOGRAM (N/A) +/- PCI as a surgical intervention .  The patient's history has been reviewed, patient examined, no change in status, stable for surgery.  I have reviewed the patient's chart and labs.  Questions were answered to the patient's satisfaction.     Victor Lane  Cath Lab Visit (complete for each Cath Lab visit)  Clinical Evaluation Leading to the Procedure:   ACS: yes  Non-ACS:    Anginal Classification: CCS III  Anti-ischemic medical therapy: Maximal Therapy (2 or more classes of medications)  Non-Invasive Test Results: High-risk stress test findings: cardiac mortality >3%/year  Prior CABG: No previous CABG

## 2013-08-14 NOTE — Progress Notes (Signed)
Dr. Radford Pax paged at this time; pt back from HD upset regarding the way his care has been handled; pt upset that he is still NPO without any orders for cardiac cath; RN spoke with wife at least 4 times today regarding calling physicians; will await for Dr. Radford Pax to return page to clarify orders for cath and to make aware wife would like to speak with her; will await callback.

## 2013-08-14 NOTE — Progress Notes (Addendum)
This is a very difficult problem. He is anxious and upset that he can't eat. He complains of right chest pain that is worse with palpations. He deieis dyspnea.   He had dialysis earlier today.  I do not hear a rub, either pericardial or pleural.  Nuclear study is high risk with decreased LVEF (38%), and multi-zone perfusion abnormality. Current right chest pain does not appear to be cardiac. I see no rash too suggest shingles or other obvious abnormality.  Cath later today if the interventionalist feels comfortable proceeding given the current situation with aggitation and pain.  Spoke to wife. She feels he is anxious. She will call and speak to him. 

## 2013-08-14 NOTE — Progress Notes (Signed)
08/14/2013 11:26 AM  Flush performed per MD orders.  Pt did experience some mild discomfort during drains but otherwise tolerated procedure well.  Fluid during first flush was blood tinged but clear, two small stringy clots noted in drainage bag. The final two exchanges were clot free, fluid still blood tinged but clear. Pt left empty.  Dressing changed with Exsept applied.   Princella Pellegrini

## 2013-08-14 NOTE — Progress Notes (Signed)
NUTRITION FOLLOW UP  INTERVENTION:  Continue Nepro Shake po daily, each supplement provides 425 kcal and 19 grams protein RD to follow for nutrition care plan  NUTRITION DIAGNOSIS: Increased nutrient needs related to ESRD as evidenced by estimated nutrition needs 30 kcals/kg and 1.2-1.4 gm protein/kg, ongoing  Goal: Intake to meet >90% of estimated nutrition needs, progressing  Monitor:  PO & supplemental intake, weight, labs, I/O's  ASSESSMENT: Patient is a 68 year old with extensive PMH including severe cardiovascular disease, ESRD and vocal cord cancer , critical illness with resp failure 01/2013 w/ trach presenting to the ER 2/15 for abdominal pain, fever, weakness with suspected Sepsis. Discharged from hospital 2/11 after Peritoneal Dialysis cath placed.   Patient transferred from 6E-Medical Renal to 2W-Cardiac 2/18.  Patient with infected PD catheter; purulent drainage on admission but dressing currently clean and dry.  Tolerating HD treatments.  PO intake variable at 0-100% per flowsheet records on Heart Healthy diet. Receiving Nepro Shake & RENA-VIT daily.  Also has probiotic ordered.  NPO for possible cardiac cath.    Height: Ht Readings from Last 1 Encounters:  08/07/13 5\' 10"  (1.778 m)    Weight: Wt Readings from Last 1 Encounters:  08/14/13 154 lb 1.6 oz (69.9 kg)    Estimated Nutritional Needs: Kcal: 2300-2500 Protein: 95-110 gm Fluid: 1.2 L  Skin: abdominal incision  Diet Order: NPO   Intake/Output Summary (Last 24 hours) at 08/14/13 1520 Last data filed at 08/14/13 1200  Gross per 24 hour  Intake    680 ml  Output   3500 ml  Net  -2820 ml    Labs:   Recent Labs Lab 08/09/13 0813  08/11/13 1238 08/12/13 0712 08/14/13 0900  NA 132*  < > 133* 134* 135*  K 5.9*  < > 3.7 3.6* 3.8  CL 94*  < > 92* 95* 97  CO2 26  < > 23 24 25   BUN 20  < > 34* 18 30*  CREATININE 3.55*  < > 4.75* 3.40* 5.14*  CALCIUM 8.4  < > 8.4 8.4 8.3*  PHOS 3.8  --  3.2  2.1*  --   GLUCOSE 98  < > 103* 100* 87  < > = values in this interval not displayed.  CBG (last 3)   Recent Labs  08/13/13 1635 08/13/13 2054 08/14/13 1257  GLUCAP 121* 150* 73    Scheduled Meds: . cefTAZidime (FORTAZ)  IV  2 g Intravenous Q M,W,F-HD  . doxercalciferol  5 mcg Intravenous Q M,W,F-HD  . heparin  5,000 Units Subcutaneous 3 times per day  . insulin aspart  0-5 Units Subcutaneous QHS  . insulin aspart  0-9 Units Subcutaneous TID WC  . lip balm  1 application Topical BID  . LORazepam  1 mg Oral Once  . metoprolol tartrate  12.5 mg Oral BID  . multivitamin  1 tablet Oral QHS  . pantoprazole  40 mg Oral Daily  . polyethylene glycol  17 g Oral BID  . saccharomyces boulardii  250 mg Oral BID  . sodium chloride  3 mL Intravenous Q12H  . vancomycin  750 mg Intravenous Q M,W,F-HD    Continuous Infusions: . [START ON 08/15/2013] sodium chloride      Past Medical History  Diagnosis Date  . Chest pain     non-ischemic stress test 02/2011 (Dr. Radford Pax)  . Carotid artery occlusion     s/p CEA  . Neuropathy   . Arthritis   . H/O vitamin  D deficiency   . Hearing loss   . Hypercholesteremia   . 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 S/P radiation therapy 01/24/2013-03/14/2013    Larynx/glottis / 63 Gy in 28 fractions  . Hypertension   . GERD (gastroesophageal reflux disease)   . Complication of anesthesia     hard to wake up and then panics waking up  . Sleep apnea     to request from Dr.Fried was done about 32yrs ago but no cpap d/t weight loss  . Fibromyalgia     takes tramadol daily as needed  . Myalgia and myositis   . Peripheral neuropathy   . History of gout   . History of colon polyps   . ESRD on dialysis     - Stage 5- follwed by Dr Sharlotte Alamo in [place]  . Diabetes mellitus   . Cataracts, bilateral   . COPD (chronic obstructive pulmonary disease)     Past Surgical History  Procedure  Laterality Date  . Cholecystectomy  1993  . Rotator cuff repair  07/06/2011    right  . Carotid endarterectomy Left   . Coolonoscopy    . Colonoscopy w/ biopsies and polypectomy      Hx: of  . Microlaryngoscopy with laser N/A 01/04/2013    Procedure: MICROLARYNGOSCOPY WITH BIOPSY/LASER;  Surgeon: [name], MD;  Location: [place];  Service: ENT;  Laterality: N/A;  . Av fistula placement Left 02/23/2013    Procedure: ARTERIOVENOUS (AV) FISTULA CREATION;  Surgeon: Conrad Hanna, MD;  Location: [place];  Service: Vascular;  Laterality: Left;  . Capd insertion N/A 08/01/2013    Procedure: LAPAROSCOPIC INSERTION CONTINUOUS AMBULATORY PERITONEAL DIALYSIS CATHETER;  Surgeon: [name], MD;  Location: [place];  Service: General;  Laterality: N/A;  . Laparoscopic abdominal exploration N/A 08/01/2013    Procedure: LAPAROSCOPIC ABDOMINAL EXPLORATION;  Surgeon: [name], MD;  Location: [place];  Service: General;  Laterality: N/A;    Nefi Holms, RD, LDN Pager #: [phone] After-Hours Pager #: [phone]

## 2013-08-14 NOTE — Progress Notes (Addendum)
  [facility] Progress Note   Subjective: Feels cold but no rigors. Coughing clear phlegm. No SOB. +stress today, possible heart cath soon per cardiology. Wants to go home, "I'm going crazy", wants to get up  Exam  Blood pressure 131/49, pulse 86, temperature 97.6 F (36.4 C), temperature source Oral, resp. rate 13, height 5\' 10"  (1.778 m), weight 73.6 kg (162 lb 4.1 oz), SpO2 96.00%. Alert, on HD No jvd Bibasilar coarse rales RRR Abd soft, PD exit site clean, no erythema or tenderness, no drainage No LE or UE edema L AVF patent Neuro is nf, ox3  Dialysis: MWF @ [facility]  4 hrs 79 kg   400/A1.5   2K/2.25Ca   Heparin 2400    AVF LUA  Hectorol 5 mcg   Epogen 0   Venofer 0  Assessment: 1 PD catheter infection / cellulitis- empiric abx, no cx's were done 2 ESRD 3 Volume excess / pulm edema- improved clinically 4 NSTEMI / +stress nuclear study / EF 35-40% 5 HTN, no low-dose MTP only 6 Afib, transient- converted, no AC for now per cardiology   Plan- HD today, cont abx, close to dry wt now. Check CXR after HD today, get PT to see pt    Kelly Splinter MD  pager [phone]    cell [phone]  08/14/2013, 8:49 AM     Recent Labs Lab 08/09/13 0813 08/10/13 0840 08/11/13 1238 08/12/13 0712  NA 132* 129* 133* 134*  K 5.9* 3.4* 3.7 3.6*  CL 94* 91* 92* 95*  CO2 26 24 23 24   GLUCOSE 98 232* 103* 100*  BUN 20 18 34* 18  CREATININE 3.55* 3.10* 4.75* 3.40*  CALCIUM 8.4 8.0* 8.4 8.4  PHOS 3.8  --  3.2 2.1*    Recent Labs Lab 08/09/13 0813 08/11/13 1238 08/12/13 0712  ALBUMIN 2.4* 2.3* 2.1*    Recent Labs Lab 08/11/13 0235 08/12/13 0711 08/14/13 0415  WBC 5.0 4.1 4.8  HGB 12.4* 13.3 12.8*  HCT 38.8* 40.6 40.3  MCV 94.9 93.8 94.8  PLT 122* 135* 154   . cefTAZidime (FORTAZ)  IV  2 g Intravenous Q M,W,F-HD  . doxercalciferol  5 mcg Intravenous Q M,W,F-HD  . heparin  5,000 Units Subcutaneous 3 times per day  . insulin aspart  0-5 Units Subcutaneous QHS  .  insulin aspart  0-9 Units Subcutaneous TID WC  . lip balm  1 application Topical BID  . metoprolol tartrate  12.5 mg Oral BID  . multivitamin  1 tablet Oral QHS  . pantoprazole  40 mg Oral Daily  . polyethylene glycol  17 g Oral BID  . saccharomyces boulardii  250 mg Oral BID  . vancomycin  750 mg Intravenous Q M,W,F-HD     sodium chloride, sodium chloride, acetaminophen, acetaminophen, albuterol, alteplase, bisacodyl, feeding supplement (NEPRO CARB STEADY), heparin, heparin, HYDROmorphone (DILAUDID) injection, hydrOXYzine, ipratropium-albuterol, lactulose, lidocaine (PF), lidocaine-prilocaine, magic mouthwash, pentafluoroprop-tetrafluoroeth

## 2013-08-14 NOTE — Progress Notes (Signed)
Pt resting quietly at this time; son in room; will cont. To monitor.

## 2013-08-14 NOTE — H&P (View-Only) (Signed)
This is a very difficult problem. He is anxious and upset that he can't eat. He complains of right chest pain that is worse with palpations. He deieis dyspnea.   He had dialysis earlier today.  I do not hear a rub, either pericardial or pleural.  Nuclear study is high risk with decreased LVEF (38%), and multi-zone perfusion abnormality. Current right chest pain does not appear to be cardiac. I see no rash too suggest shingles or other obvious abnormality.  Cath later today if the interventionalist feels comfortable proceeding given the current situation with aggitation and pain.  Spoke to wife. She feels he is anxious. She will call and speak to him.

## 2013-08-14 NOTE — Procedures (Signed)
I was present at this dialysis session, have reviewed the session itself and made  appropriate changes  Rob Jacky Hartung MD (pgr) [phone]    (c) [phone] 08/14/2013, 8:48 AM   

## 2013-08-14 NOTE — Progress Notes (Signed)
Pt now sitting on side of bed c/o R chest/ R rib pain unrelieved by IV Dilaudid; Dr. Carles Collet paged to make aware of pt's pain complaints at this time; pt very anxious and very upset regarding care plan; pt given PO Atarax for anxiety at this time; plan for cardiac cath today per Dr. Tamala Julian; pt agreeable to procedure; pt remains NPO for cardiac cath; VSS; O2 sats 4L [place] 100%; will cont. To monitor.

## 2013-08-14 NOTE — Progress Notes (Signed)
PT Cancellation Note  Patient Details Name: Victor Lane MRN: [id number] DOB: [birth year]   Cancelled Treatment:    Reason Treat Not Completed: 1) pt in dialysis this morning, 2) upset/agitated and having pain this pm, 3) currently calm and waiting to go down for heart catheterization. RN agreed not appropriate to offer ambulation/activity/exercise at this time.   Doyce Stonehouse 08/14/2013, 4:45 PM Pager [phone]

## 2013-08-14 NOTE — H&P (View-Only) (Signed)
  Subjective: The patient is extremely anxious.  He is complaining about everything including the bed.  He had severe right and left rib pain.  Objective: Vital signs in last 24 hours: Temp:  [97.4 F (36.3 C)-97.9 F (36.6 C)] 97.5 F (36.4 C) (02/23 1245) Pulse Rate:  [82-113] 113 (02/23 1245) Resp:  [13-20] 16 (02/23 1245) BP: (113-144)/(49-71) 113/66 mmHg (02/23 1245) SpO2:  [96 %-100 %] 99 % (02/23 1245) Weight:  [154 lb 1.6 oz (69.9 kg)-162 lb 4.1 oz (73.6 kg)] 154 lb 1.6 oz (69.9 kg) (02/23 1200) Last BM Date: 08/12/13  Intake/Output from previous day: 02/22 0701 - 02/23 0700 In: 680 [P.O.:680] Out: -  Intake/Output this shift: Total I/O In: -  Out: 3500 [Other:3500]  Medications Current Facility-Administered Medications  Medication Dose Route Frequency Provider Last Rate Last Dose  . acetaminophen (TYLENOL) suppository 650 mg  650 mg Rectal Q6H PRN Steven C. Gross, MD      . acetaminophen (TYLENOL) tablet 325-650 mg  325-650 mg Oral Q6H PRN Steven C. Gross, MD   650 mg at 08/11/13 2216  . albuterol (PROVENTIL) (2.5 MG/3ML) 0.083% nebulizer solution 2.5 mg  2.5 mg Nebulization Q2H PRN Tammy S Parrett, NP      . bisacodyl (DULCOLAX) suppository 10 mg  10 mg Rectal Q12H PRN Steven C. Gross, MD      . cefTAZidime (FORTAZ) 2 g in dextrose 5 % 50 mL IVPB  2 g Intravenous Q M,W,F-HD David Tat, MD   2 g at 08/14/13 1131  . doxercalciferol (HECTOROL) injection 5 mcg  5 mcg Intravenous Q M,W,F-HD Karen E Warren, PA-C   5 mcg at 08/14/13 0837  . heparin injection 5,000 Units  5,000 Units Subcutaneous 3 times per day Tammy S Parrett, NP   5,000 Units at 08/14/13 0626  . HYDROmorphone (DILAUDID) injection 0.5 mg  0.5 mg Intravenous Q4H PRN Adam R. Belanger, MD   0.5 mg at 08/14/13 0836  . hydrOXYzine (ATARAX/VISTARIL) tablet 25 mg  25 mg Oral TID PRN David Tat, MD      . insulin aspart (novoLOG) injection 0-5 Units  0-5 Units Subcutaneous QHS Jessica U Vann, DO      . insulin  aspart (novoLOG) injection 0-9 Units  0-9 Units Subcutaneous TID WC Jessica U Vann, DO   1 Units at 08/13/13 1747  . ipratropium-albuterol (DUONEB) 0.5-2.5 (3) MG/3ML nebulizer solution 3 mL  3 mL Nebulization Q6H PRN David Tat, MD      . lactulose (CHRONULAC) 10 GM/15ML solution 20 g  20 g Oral BID PRN Steven C. Gross, MD      . lip balm (BLISTEX) ointment 1 application  1 application Topical BID Steven C. Gross, MD   1 application at 08/14/13 1000  . magic mouthwash  15 mL Oral QID PRN Steven C. Gross, MD   15 mL at 08/13/13 0233  . metoprolol tartrate (LOPRESSOR) tablet 12.5 mg  12.5 mg Oral BID David Tat, MD   12.5 mg at 08/14/13 1246  . multivitamin (RENA-VIT) tablet 1 tablet  1 tablet Oral QHS Karen E Warren, PA-C   1 tablet at 08/13/13 2234  . pantoprazole (PROTONIX) EC tablet 40 mg  40 mg Oral Daily Tammy S Parrett, NP   40 mg at 08/13/13 1319  . polyethylene glycol (MIRALAX / GLYCOLAX) packet 17 g  17 g Oral BID Steven C. Gross, MD   17 g at 08/12/13 2217  . saccharomyces boulardii (FLORASTOR) capsule 250 mg    250 mg Oral BID [name], MD   250 mg at 08/14/13 1246  . vancomycin (VANCOCIN) IVPB 750 mg/150 ml premix  750 mg Intravenous Q M,W,F-HD Orson Eva, MD   750 mg at 08/11/13 1539    PE: General appearance: alert, cooperative and moderate distress Lungs: clear to auscultation bilaterally Heart: regular rate and rhythm, S1, S2 normal, no murmur, click, rub or gallop Chest:  Ribs on right- mid axillary line extremely tender with light palpation. Abdomen: Nontender.   Extremities: No LEE Pulses: 1+ Skin: Warm and dry Neurologic: Grossly normal.  Lab Results:   Recent Labs  08/12/13 0711 08/14/13 0415  WBC 4.1 4.8  HGB 13.3 12.8*  HCT 40.6 40.3  PLT 135* 154   BMET  Recent Labs  08/12/13 0712 08/14/13 0900  NA 134* 135*  K 3.6* 3.8  CL 95* 97  CO2 24 25  GLUCOSE 100* 87  BUN 18 30*  CREATININE 3.40* 5.14*  CALCIUM 8.4 8.3*    Assessment/Plan     Principal Problem:   Severe sepsis(995.92) Active Problems:   Glottis carcinoma   Acute respiratory failure   Diabetes mellitus   HTN (hypertension)   ESRD (end stage renal disease) on dialysis   Anemia of chronic disease   Protein-calorie malnutrition, severe   Other malaise and fatigue   COPD GOLD II   Peritonitis   HCAP (healthcare-associated pneumonia)   CAPD (continuous ambulatory peritoneal dialysis) catheter in place   Elevated troponin level   Atrial flutter   Valvular heart disease - moderate AI, moderate TR, mild MR by echo 08/07/13   Acute on chronic CHF with newly recognized systolic dysfunction EF 62-13%   Thrombocytopenia   Leukopenia  Plan:   Positive nuclear stress test.  Left heart cath today. The patient is extremely anxious.   I am notr sure what to make of his rib pain.  It really seams out of proportion to the light touch during exam.   Dilaudid given for pain.   Will need ativan prior to cath.  BP stable.  Maintaining NSR.    I spoke to the patient's wife on the phone and gave her an update.    LOS: 8 days    Jabron Weese 08/14/2013 1:19 PM

## 2013-08-14 NOTE — Progress Notes (Signed)
Dr. Carles Collet at bedside; will cont. To monitor.

## 2013-08-15 ENCOUNTER — Inpatient Hospital Stay (HOSPITAL_COMMUNITY): Payer: Medicare Other

## 2013-08-15 DIAGNOSIS — I214 Non-ST elevation (NSTEMI) myocardial infarction: Secondary | ICD-10-CM

## 2013-08-15 LAB — GLUCOSE, CAPILLARY
GLUCOSE-CAPILLARY: 93 mg/dL (ref 70–99)
GLUCOSE-CAPILLARY: 95 mg/dL (ref 70–99)
Glucose-Capillary: 132 mg/dL — ABNORMAL HIGH (ref 70–99)

## 2013-08-15 MED ORDER — VANCOMYCIN HCL IN DEXTROSE 750-5 MG/150ML-% IV SOLN: 750.0000 mg | INTRAVENOUS | Status: DC

## 2013-08-15 MED ORDER — DEXTROSE 5 % IV SOLN: 2.0000 g | INTRAVENOUS | Status: DC

## 2013-08-15 NOTE — Discharge Summary (Signed)
Physician Discharge Summary  Victor Lane Victor Lane SAY:[id number] DOB: 1946/01/07 DOA: 08/06/2013  PCP: Victor Miyamoto, MD  Admit date: 08/06/2013 Discharge date: 08/15/2013  Recommendations for Outpatient Follow-up:  1. Pt will need to follow up with PCP in 2 weeks post discharge 2. Please obtain BMP to evaluate electrolytes and kidney function 3. Please also check CBC to evaluate Hg and Hct levels 4. Continue vancomycin 750 mg IV on dialysis--Monday, Wednesday, Friday with the last dose on 08/21/2013 5. Continue ceftazidime 2 g with dialysis--Monday, Wednesday, Friday with the last dose on 08/21/2013  Discharge Diagnoses:  Acute on chronic respiratory failure  -Patient remains hypoxemic with oxygen saturation in the low 90s-->increased nasal cannula to 4 L on 2/20--now stable on 3L--> with continued dialysis, the patient was weaned off of supplemental oxygen. He remained clinically stable on room air. We will continue his nocturnal oxygen as prescribed prior to admission. -secondary to Bilateral pleural effusions/fluid overload  -weight 81.8kg on 2/115/15-->70.9kg on 08/12/13  -08/11/13 Repeat chest x-ray revealed persistent interstitial edema with small b/l pleural effusions  -COPD on nocturnal O2 w/out exacerbation  -Oxygenation improving with fluid removal  -Albuterol / Ipratropium prn  Acute on chronic systolic CHF  -EF 23-55% on 08/07/13 echo  -Nuclear stress test canceled yesterday (08/11/2013)  -SPECT scan without gated study showed focal defects in the apex and anteroseptal region  -LexiScan 08/13/13--markedly abnormal showingeversible myocardial perfusion defects at the inferior/inferolateral and anteroseptal walls of the left ventricle. Mildly decreased left ventricular ejection fraction of 38%. No focal wall motion abnormalities.  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 -Cardiology  recommended medical therapy -oxygenating better with fluid removal  Paroxysmal atrial flutter  -Developed on the evening of 08/09/2013  -Spontaneous conversion to sinus rhythm after Cardizem drip  -Continue metoprolol tartrate--remained in sinus rhythm  -CHADSVASc= 4  -defer anticoagulation decision to cardiology-->antiplatelets--patient will home with aspirin 81 mg daily--as recommended by cardiology -Defer chronotropic agent management to cardiology--metoprolol tartrate 12.5 mg twice a day  Elevated troponin:  -Most likely from demand ischemia/NSTEMI  -ischemic workup in progress  -Echocardiogram EF 73-22%, grade 2 diastolic dysfunction, no WMA  -trop (1.31 max)  -Appreciate cardiology followup  --LexiScan 08/13/13--markedly abnormal showingeversible myocardial perfusion defects at the inferior/inferolateral and anteroseptal walls of the left ventricle. Mildly decreased left ventricular ejection fraction of 38%. No focal wall motion abnormalities.  Severe sepsis  -improved  -Blood and urine cultures are negative  -continue IV vanco  -d/c aztreonam and Flagyl  -start ceftazidime 2 grams with HD  -plan antibiotics on HD until 08/20/13  ESRD on HD - MWF  -Try to maintain CAPD catheter as long as the patient clinically improves  -HD per renal  Peritonitis  -S/p PD cath placement 2/10: Flushing well, healing well externally (? Extension tubing pulled off at home)  -Clinically improving without any further abdominal pain--as a result, his PD catheter will be left in place -Continue vancomycin, ceftazidime-->plan antibiotics on HD until 08/21/13  -Weekly PD catheter maintenance was performed on 08/14/2013 Flu A  -added tamiflu--started 08/08/2013  -Continue oseltamivir through 08/12/2013  Thrombocytopenia/leukopenia  -Platelets appear to be gradually improving  -likely due to influenza although HIT is consideration  -HIT panel--negative  -other culprits include meds--protonix, vanco   -continue to trend--the patient's platelets continued to improve with treatment of his acute medical illness  DM  -intermitten Hypoglycemia due to glipizide  -SSI  -CBG 69-104; D50 IV  as needed for hypoglycemia  -Hold Glipizide during the hospitalization, although this will be resumed at the time of discharge  -hemoglobin A1c--6.3  Lower extremity edema  -Left greater than right  -Venous duplex rule out DVT--negative for DVT  Renal Mass  -Left renal 1.1 cm hyperdensity likely represents hemorrhagic cyst  -Needs a 6 month surveillance CT  Family Communication: wife updated on phone  Disposition Plan: Home when medically stable  Antibiotics:  Vancomycin 08/06/2013>>> 08/21/2013 Ceftazidime 08/09/2013>>> 08/21/2013 Aztreonam 08/06/2013>>> 08/09/2013  Flagyl 08/06/13>>>08/09/13   Discharge Condition: Stable  Disposition:  discharge home  Diet: Renal Wt Readings from Last 3 Encounters:  08/15/13 69.7 kg (153 lb 10.6 oz)  08/15/13 69.7 kg (153 lb 10.6 oz)  08/02/13 78.9 kg (173 lb 15.1 oz)    History of present illness:  68 y.o. 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 Johney Maine on 08/01/13, but last night awoke with severe abdominal pain, chills, and weakness and presented to the ED with fever of 101.9. Pt had difficulty post op with inability to protect airway w/ reintubation requiring vent support overnight, He received HD and was extubated w/ discharge home on 2/11. Pt has an extensive hx with prolonged critical illness 01/2013 w/ Acute resp failrue requiring trach now decannulated.  Presented to ER on 07/1513 am with fever, abd pain , weakness. B/P marginal ~106, tachycardia. Lactate 13 . Felt to have SIRS/Sepsis . CT abd /pelvis showed mod  right pleural effusion , bibasilar aspdz. No acute abd process noted.  PCCM was asked to admit. Antibiotics were started, and Nephrology and General Surgery was consulted. On the day of presentation, the patient had a chest x-ray findings suggestive of pulmonary edema with a potassium of 6.4. The patient was dialyzed on 08/06/2013. The patient's external tubing was replaced under a sterile environment. The patient was also noted to have elevated troponins for which cardiology was consulted. They felt this was likely due to demand ischemia in setting of ESRD. LexiScan was abnormal. Patient underwent heart catheterization which revealed three-vessel disease. Given the patient's comorbidities medical therapy was recommended. The patient continued on hemo-dialysis during the hospitalization. He was fluid overloaded. His estimated dry weight was decreased. The patient's respiratory status continued to improve. He received antibiotics on dialysis for his peritonitis. His abdominal pain improved. He will finish a two-week course of vancomycin and ceftazidime with antibiotics to be given on dialysis. The patient experienced an episode of atrial flutter with rapid ventricular response. He was placed on a Cardizem drip. He spontaneously converted to sinus rhythm after 12 hours. Cardiology recommended aspirin daily.    Consultants: Cardiology -Nephrology  Discharge Exam: Filed Vitals:   08/15/13 1330  BP: 127/64  Pulse: 87  Temp: 97.3 F (36.3 C)  Resp: 18   Filed Vitals:   08/14/13 2027 08/15/13 0557 08/15/13 1207 08/15/13 1330  BP: 101/50 139/59  127/64  Pulse: 97 97  87  Temp: 98.3 F (36.8 C) 97.7 F (36.5 C)  97.3 F (36.3 C)  TempSrc: Axillary Oral  Oral  Resp: _0 Height:      Weight:  69.7 kg (153 lb 10.6 oz)    SpO2:  95% 95% 98%   General: A&O x 3, NAD, pleasant, cooperative Cardiovascular: RRR, no  rub, no gallop, no S3 Respiratory: Bibasilar crackles. No wheezing. Good air  movement. Abdomen:soft, nontender, nondistended, positive bowel sounds Extremities: No edema, No lymphangitis, no petechiae  Discharge Instructions   Future Appointments Provider Department Dept Phone   08/16/2013 8:30 AM [name], MD [facility], [place]   11/24/2013 3:00 PM Eppie Gibson, MD [facility] [phone]       Medication List    ASK your doctor about these medications       aspirin 81 MG tablet  Take 81 mg by mouth daily.     calcium acetate 667 MG capsule  Commonly known as:  PHOSLO  Take 1,334 mg by mouth 3 (three) times daily with meals.     diphenhydrAMINE 25 MG tablet  Commonly known as:  BENADRYL  Take 50 mg by mouth every 6 (six) hours as needed for itching.     esomeprazole 40 MG capsule  Commonly known as:  NEXIUM  Take 40 mg by mouth daily before breakfast.     feeding supplement (NEPRO CARB STEADY) Liqd  Take 237 mLs by mouth 2 (two) times daily between meals.     glipiZIDE 2.5 MG 24 hr tablet  Commonly known as:  GLUCOTROL XL  Take 2.5 mg by mouth daily as needed (diabetes).     metoprolol tartrate 25 MG tablet  Commonly known as:  LOPRESSOR  Take 0.5 tablets (12.5 mg total) by mouth 2 (two) times daily.     multivitamin Tabs tablet  Take 1 tablet by mouth daily.     traMADol 50 MG tablet  Commonly known as:  ULTRAM  Take 1-2 tablets (50-100 mg total) by mouth every 6 (six) hours as needed for moderate pain or severe pain.     triamcinolone cream 0.1 %  Commonly known as:  KENALOG  Apply 1 application topically 2 (two) times daily. Apply to knee and lower leg.         The results of significant diagnostics from this hospitalization (including imaging, microbiology, ancillary and laboratory) are listed below for reference.    Significant Diagnostic Studies: Ct Abdomen Pelvis Wo Contrast  08/06/2013   CLINICAL DATA:  Abdominal pain and fever. Recent surgical procedure for  peritoneal dialysis 5 days ago.  EXAM: CT ABDOMEN AND PELVIS WITHOUT CONTRAST  TECHNIQUE: Multidetector CT imaging of the abdomen and pelvis was performed following the standard protocol without intravenous contrast.  COMPARISON:  None.  FINDINGS: Lung bases demonstrate a moderate size right pleural effusion and small left pleural effusion with associated airspace opacification which may be due to atelectasis or infection. There is mild cardiomegaly.  Abdominal images demonstrate evidence of a prior cholecystectomy. There is a small amount of perihepatic fluid. There is mild fatty infiltration of the liver with subtle nodular contour. The pancreas and adrenal glands are unremarkable. Appendix is within normal. Kidneys are normal in size with bilateral renal cortical thinning. There is a 6 cm exophytic simple cyst over the mid pole of the left kidney. There are a few small of bilateral hypodensities likely cysts. There is a 1.1 cm homogeneously hyperdense mass over the lower pole of the left kidney with Hounsfield unit measurements 45 likely a hemorrhagic cyst. Ureters are within normal. There is no hydronephrosis or nephrolithiasis. There is diverticulosis of the colon most prominent over the sigmoid colon. There is a peritoneal dialysis catheter entering the left mid abdomen with tip coiled over the pelvis between loops of rectosigmoid  colon. There is moderate calcified plaque of the thoracoabdominal aorta and iliac arteries. Minimal free fluid over the right pericolic gutter. No evidence of free peritoneal air.  Remaining pelvic structures are notable only for moderate fecal retention over the rectosigmoid colon. There is mild degenerative change of the spine and hips.  IMPRESSION: Moderate size right pleural effusion and small left pleural effusion with associated airspace opacification in the lower lobes which may be due to pneumonia or atelectasis.  Peritoneal dialysis catheter with tip coiled over the pelvis  between several recto sigmoid colonic loops.  Changes involving the liver which may be due to cirrhosis. Small amount of ascites.  Bilateral renal cortical thinning and bilateral renal cysts. 1.1 cm homogeneously hyperdense left renal mass likely a hemorrhagic cyst. Recommend followup CT 6 months.  Diverticulosis of the colon. Moderate fecal retention over the rectosigmoid colon.  Small hiatal hernia.   Electronically Signed   By: Marin Olp M.D.   On: 08/06/2013 07:57   Dg Chest 2 View  08/15/2013   CLINICAL DATA:  End-stage renal disease.  Post dialysis.  Edema.  EXAM: CHEST  2 VIEW  COMPARISON:  08/12/2013.  FINDINGS: Lungs are hyperexpanded. There is bilateral irregular interstitial thickening similar to the prior exam consistent with interstitial edema. Small bilateral effusions are suggested. No pneumothorax.  Cardiac silhouette is normal in size and configuration. Normal mediastinal and hilar contours.  IMPRESSION: Persistent interstitial pulmonary edema. No change from the recent prior exam. Underlying COPD is suggested by lung hyperexpansion.   Electronically Signed   By: Lajean Manes M.D.   On: 08/15/2013 08:29   Dg Chest 2 View  08/11/2013   CLINICAL DATA:  Pulmonary edema.  EXAM: CHEST  2 VIEW  COMPARISON:  August 07, 2013.  FINDINGS: Stable cardiomegaly. Continued presence of interstitial densities are noted throughout both lungs most consistent with pulmonary edema or less likely pneumonia which is not significantly changed. Mild bilateral pleural effusions are noted. No pneumothorax is noted. Bony thorax is intact.  IMPRESSION: Stable bilateral lung opacities are noted most consistent with pulmonary edema, although pneumonia cannot be excluded. Mild bilateral pleural effusions are noted.   Electronically Signed   By: Sabino Dick M.D.   On: 08/11/2013 10:46   Nm Myocar Single W/spect W/wall Motion And Ef  08/13/2013   CLINICAL DATA:  Chest pain, history hypertension, COPD, diabetes,  end-stage renal disease on dialysis, vocal cord cancer post radiation therapy, carotid vascular disease  EXAM: MYOCARDIAL IMAGING WITH SPECT (REST AND PHARMACOLOGIC-STRESS - 2 DAY PROTOCOL)  GATED LEFT VENTRICULAR WALL MOTION STUDY  LEFT VENTRICULAR EJECTION FRACTION  TECHNIQUE: Standard myocardial SPECT imaging was performed after resting intravenous injection of 10 mCi Tc-3msestamibi. Subsequently, on a second day, intravenous infusion of Lexiscan was performed under the supervision of the Cardiology staff. At peak effect of the drug, 30 mCi Tc-940mestamibi was injected intravenously and standard myocardial SPECT imaging was performed. Quantitative gated imaging was also performed to evaluate left ventricular wall motion, and estimate left ventricular ejection fraction.  COMPARISON:  None.  FINDINGS: Myocardial profusion SPECT images obtained following pharmacologic stress demonstrate dilatation of the left ventricle.  Large perfusion defect identified at inferior/inferolateral wall left ventricle.  Small perfusion defect identified at anteroseptal wall left ventricle.  Resting exam demonstrates reperfusion of both of these defects.  No pulmonary uptake of tracer.  Mildly decreased left ventricular ejection fraction of 38% is calculated on gated SPECT images following pharmacologic stress.  This is derived  from an end-diastolic volume calculation of 162 mL and an end systolic volume of 416 mL.  Wall motion analysis shows no focal wall motion abnormality.  IMPRESSION: Reversible myocardial perfusion defects at the inferior/inferolateral and anteroseptal walls of the left ventricle.  Mildly decreased left ventricular ejection fraction of 38%.  No focal wall motion abnormalities.   Electronically Signed   By: Lavonia Dana M.D.   On: 08/13/2013 14:22   Nm Myocar Single W/spect W/wall Motion And Ef  08/11/2013   CLINICAL DATA:  Chest pain  EXAM: MYOCARDIAL IMAGING WITH SPECT (REST)  TECHNIQUE: Standard myocardial  SPECT imaging was performed after resting intravenous injection of 10 mCi Tc-51msestamibi. Quantitative gated imaging was also performed to evaluate left ventricular wall motion, and estimate left ventricular ejection fraction.  COMPARISON:  None.  FINDINGS: SPECT imaging without gating was performed. There is moderately diminished activity throughout the inferior wall. There is also a perfusion defect at the apex and a final focal mid anteroseptal perfusion defect.  IMPRESSION: Rest imaging only without gating was performed. There is thinning of the inferior wall. There are focal defects at the apex and mid anteroseptal regions.   Electronically Signed   By: AMaryclare BeanM.D.   On: 08/11/2013 11:22   Dg Chest Port 1 View  08/12/2013   CLINICAL DATA:  Followup pulmonary edema.  Status post dialysis.  EXAM: PORTABLE CHEST - 1 VIEW  COMPARISON:  08/11/13  FINDINGS: Asymmetric pulmonary edema shows mild improvement since previous study. Probable small pleural effusions again noted, however there is improved aeration in both lung bases. Heart size is stable.  IMPRESSION: Mild interval improvement in diffuse pulmonary edema. Persistent small pleural effusions.   Electronically Signed   By: JEarle GellM.D.   On: 08/12/2013 13:12   Dg Chest Port 1 View  08/07/2013   CLINICAL DATA:  Shortness of breath, pneumonia  EXAM: PORTABLE CHEST - 1 VIEW  COMPARISON:  DG CHEST 1V PORT dated 08/06/2013; DG CHEST 1V PORT dated 08/02/2013; DG CHEST 2 VIEW dated 04/25/2013; DG CHEST 1V PORT dated 02/16/2013  FINDINGS: Grossly unchanged enlarged cardiac silhouette and mediastinal contours with atherosclerotic plaque within the thoracic aorta. The pulmonary vasculature is indistinct with cephalization of flow. Interval increase in now small layering bilateral effusions with associated worsening bibasilar opacities, left greater than right. No definite evidence of edema. Grossly unchanged bones including presumed resection of the distal end  of the right clavicle.  IMPRESSION: Worsening pulmonary edema, small layering bilateral effusions and associated bibasilar opacities, left greater than right, atelectasis versus infiltrate.   Electronically Signed   By: JSandi MariscalM.D.   On: 08/07/2013 07:36   Dg Chest Port 1 View  08/06/2013   CLINICAL DATA:  68year old male with shortness of breath, fever abdominal pain cyanosis. Initial encounter.  EXAM: PORTABLE CHEST - 1 VIEW  COMPARISON:  08/02/2013 and earlier.  FINDINGS: Portable AP semi upright view at 0612 hrs.  Extubated. Decreased bilateral veiling pulmonary opacity. Improved retrocardiac ventilation. Stable cardiac size and mediastinal contours. No pneumothorax. Stable pulmonary vascularity/interstitial opacity.  IMPRESSION: 1. Extubated. 2. Decreased appearance of bilateral pleural effusions and improved bibasilar ventilation. 3. Continued  pulmonary vascular congestion/interstitial edema.   Electronically Signed   By: LLars PinksM.D.   On: 08/06/2013 06:31   Dg Chest Port 1 View  08/02/2013   CLINICAL DATA:  Respiratory failure.  EXAM: PORTABLE CHEST - 1 VIEW  COMPARISON:  DG CHEST 1V PORT dated 08/01/2013  FINDINGS: Endotracheal tube is approximately 3 cm above the carina. Degree of pulmonary edema at may be mildly improved. There are likely bilateral pleural effusions. The heart remains moderately enlarged.  IMPRESSION: Mild decrease in pulmonary edema.   Electronically Signed   By: Aletta Edouard M.D.   On: 08/02/2013 07:40   Dg Chest Port 1 View  08/01/2013   CLINICAL DATA:  Status post re-intubation  EXAM: PORTABLE CHEST - 1 VIEW  COMPARISON:  05/22/2013  FINDINGS: An endotracheal tube is seen 3.5 cm above the carinal. The cardiac shadow is prominent. Vascular congestion with mild posteriorly layering effusions is identified. A temporary dialysis catheter is not seen on this film. Correlation with the clinical findings is recommended.  IMPRESSION: Vascular congestion and effusions which  may be related to volume overload.  Endotracheal tube as described.   Electronically Signed   By: Inez Catalina M.D.   On: 08/01/2013 16:22     Microbiology: Recent Results (from the past 240 hour(s))  CULTURE, BLOOD (ROUTINE X 2)     Status: None   Collection Time    08/06/13  5:55 AM      Result Value Ref Range Status   Specimen Description BLOOD FOREARM RIGHT   Final   Special Requests BOTTLES DRAWN AEROBIC AND ANAEROBIC 6ML   Final   Culture  Setup Time     Final   Value: 08/06/2013 13:29     Performed at [facility]   Culture     Final   Value: NO GROWTH 5 DAYS     Performed at [facility]   Report Status 08/13/2013 FINAL   Final  CULTURE, BLOOD (ROUTINE X 2)     Status: None   Collection Time    08/06/13  6:30 AM      Result Value Ref Range Status   Specimen Description BLOOD RIGHT FOREARM   Final   Special Requests BOTTLES DRAWN AEROBIC AND ANAEROBIC [facility]   Final   Culture  Setup Time     Final   Value: 08/06/2013 17:59     Performed at [facility]   Culture     Final   Value: NO GROWTH 5 DAYS     Performed at [facility]   Report Status 08/12/2013 FINAL   Final  MRSA PCR SCREENING     Status: None   Collection Time    08/06/13 10:42 AM      Result Value Ref Range Status   MRSA by PCR NEGATIVE  NEGATIVE Final   Comment:            The GeneXpert MRSA Assay (FDA     approved for NASAL specimens     only), is one component of a     comprehensive MRSA colonization     surveillance program. It is not     intended to diagnose MRSA     infection nor to guide or     monitor treatment for     MRSA infections.  RESPIRATORY VIRUS PANEL     Status: Abnormal   Collection Time    08/06/13 11:20 AM      Result Value Ref Range Status   Source - RVPAN NASOPHARYNGEAL   Final   Respiratory Syncytial Virus A NOT DETECTED   Final   Respiratory Syncytial Virus B NOT DETECTED   Final   Influenza A DETECTED (*)  Final   Influenza B NOT DETECTED    Final  Parainfluenza 1 NOT DETECTED   Final   Parainfluenza 2 NOT DETECTED   Final   Parainfluenza 3 NOT DETECTED   Final   Metapneumovirus NOT DETECTED   Final   Rhinovirus NOT DETECTED   Final   Adenovirus NOT DETECTED   Final   Influenza A H1 NOT DETECTED   Final   Influenza A H3 DETECTED (*)  Final   Comment: (NOTE)           Normal Reference Range for each Analyte: NOT DETECTED     Testing performed using the Luminex xTAG Respiratory Viral Panel test     kit.     This test was developed and its performance characteristics determined     by [facility]. It has not been cleared or approved by the [place]     Food and Drug Administration. This test is used for clinical purposes.     It should not be regarded as investigational or for research. This     laboratory is certified under the [place] (CLIA) as qualified to perform high complexity     clinical laboratory testing.     Performed at [place]     Status: None   Collection Time    08/07/13  6:55 PM      Result Value Ref Range Status   Specimen Description URINE, RANDOM   Final   Special Requests NONE   Final   Culture  Setup Time     Final   Value: 08/07/2013 20:25     Performed at [facility] Count     Final   Value: NO GROWTH     Performed at [facility]   Culture     Final   Value: NO GROWTH     Performed at [facility]   Report Status 08/08/2013 FINAL   Final     Labs: Basic Metabolic Panel:  Recent Labs Lab 08/08/13 1504 08/09/13 0813 08/10/13 0840 08/11/13 1238 08/12/13 0712 08/14/13 0900  NA 137 132* 129* 133* 134* 135*  K 4.0 5.9* 3.4* 3.7 3.6* 3.8  CL 95* 94* 91* 92* 95* 97  CO2 _0 GLUCOSE 120* 98 232* 103* 100* 87  BUN 25* 20 18 34* 18 30*  CREATININE 3.94* 3.55* 3.10* 4.75* 3.40* 5.14*  CALCIUM 8.6 8.4 8.0* 8.4 8.4 8.3*  PHOS 3.3 3.8  --  3.2 2.1*  --    Liver  Function Tests:  Recent Labs Lab 08/08/13 1504 08/09/13 0813 08/11/13 1238 08/12/13 0712  ALBUMIN 2.5* 2.4* 2.3* 2.1*   No results found for this basename: LIPASE, AMYLASE,  in the last 168 hours No results found for this basename: AMMONIA,  in the last 168 hours CBC:  Recent Labs Lab 08/09/13 0857 08/10/13 0840 08/11/13 0235 08/12/13 0711 08/14/13 0415  WBC 2.4* 3.5* 5.0 4.1 4.8  HGB 11.1* 12.0* 12.4* 13.3 12.8*  HCT 35.2* 37.4* 38.8* 40.6 40.3  MCV 96.4 96.1 94.9 93.8 94.8  PLT 95* 110* 122* 135* 154   Cardiac Enzymes: No results found for this basename: CKTOTAL, CKMB, CKMBINDEX, TROPONINI,  in the last 168 hours BNP: No components found with this basename: POCBNP,  CBG:  Recent Labs Lab 08/14/13 1257 08/14/13 1624 08/14/13 2302 08/15/13 0554 08/15/13 1128  GLUCAP 73 79 89 95 132*    Time coordinating discharge:  Greater  than 30 minutes  Signed:  Lanora Reveron, DO [facility] Hospitalists Pager: [phone] 08/15/2013, 2:39 PM

## 2013-08-15 NOTE — Progress Notes (Signed)
  [facility] Progress Note   Subjective: Cath yesterday , medical Rx, occluded RCA and disease in LAD and LCx as well. NO complaints, ate all of breakfast, walked in halls with PT this am.  PD cath flushed yesterday  Exam  Blood pressure 139/59, pulse 97, temperature 97.7 F (36.5 C), temperature source Oral, resp. rate 18, height 5\' 10"  (1.778 m), weight 69.7 kg (153 lb 10.6 oz), SpO2 95.00%. Alert, on HD No jvd Lungs are clear today RRR Abd soft, PD exit site clean, no erythema or tenderness, no drainage No LE or UE edema L AVF patent Neuro is nf, ox3  Dialysis: MWF [facility]  4 hrs 79 kg   400/A1.5   2K/2.25Ca   Heparin 2400    AVF LUA  Hectorol 5 mcg   Epogen 0   Venofer 0  Assessment/Plan: 1 PD catheter infection / cellulitis- flush cath weekly; on empiric V/Fortaz w HD, no cx's were done, will complete course of Rx as outpt after discharge 2 NSTEMI / 3V CAD by cath- medical Rx 3 Vol excess/ pulm edema- resolved, dry wt down 9kg to 70kg 4 ESRD on hemodialysis 5 HTN, no low-dose MTP only 6 Afib, transient- converted, no AC for now per cardiology 7 Dispo- per primary    Kelly Splinter MD  pager [phone]    cell [phone]  08/15/2013, 11:40 AM     Recent Labs Lab 08/09/13 0813  08/11/13 1238 08/12/13 0712 08/14/13 0900  NA 132*  < > 133* 134* 135*  K 5.9*  < > 3.7 3.6* 3.8  CL 94*  < > 92* 95* 97  CO2 26  < > 23 24 25   GLUCOSE 98  < > 103* 100* 87  BUN 20  < > 34* 18 30*  CREATININE 3.55*  < > 4.75* 3.40* 5.14*  CALCIUM 8.4  < > 8.4 8.4 8.3*  PHOS 3.8  --  3.2 2.1*  --   < > = values in this interval not displayed.  Recent Labs Lab 08/09/13 0813 08/11/13 1238 08/12/13 0712  ALBUMIN 2.4* 2.3* 2.1*    Recent Labs Lab 08/11/13 0235 08/12/13 0711 08/14/13 0415  WBC 5.0 4.1 4.8  HGB 12.4* 13.3 12.8*  HCT 38.8* 40.6 40.3  MCV 94.9 93.8 94.8  PLT 122* 135* 154   . cefTAZidime (FORTAZ)  IV  2 g Intravenous Q M,W,F-HD  . doxercalciferol  5 mcg  Intravenous Q M,W,F-HD  . heparin  5,000 Units Subcutaneous 3 times per day  . insulin aspart  0-5 Units Subcutaneous QHS  . insulin aspart  0-9 Units Subcutaneous TID WC  . lip balm  1 application Topical BID  . LORazepam  1 mg Oral Once  . metoprolol tartrate  12.5 mg Oral BID  . multivitamin  1 tablet Oral QHS  . pantoprazole  40 mg Oral Daily  . polyethylene glycol  17 g Oral BID  . saccharomyces boulardii  250 mg Oral BID  . vancomycin  750 mg Intravenous Q M,W,F-HD     acetaminophen, acetaminophen, albuterol, bisacodyl, HYDROmorphone (DILAUDID) injection, hydrOXYzine, ipratropium-albuterol, lactulose, magic mouthwash

## 2013-08-15 NOTE — Progress Notes (Signed)
   SUBJECTIVE:  He continues to have right rib pain.     PHYSICAL EXAM Filed Vitals:   08/14/13 1245 08/14/13 1816 08/14/13 2027 08/15/13 0557  BP: 113/66  101/50 139/59  Pulse: 113 94 97 97  Temp: 97.5 F (36.4 C)  98.3 F (36.8 C) 97.7 F (36.5 C)  TempSrc: Oral  Axillary Oral  Resp: 16  17 18   Height:      Weight:    153 lb 10.6 oz (69.7 kg)  SpO2: 99%   95%   General:  No acute distress Lungs:  Decreased breath sounds, no crackles Heart:  RRR, no rub Abdomen:  Positive bowel sounds, no rebound no guarding Extremities:  No edema, right groin without hematoma or echymosis or pulsatile mass,  However, there is a bruit (also one on the left) Neuro:  Nonfocal.   LABS:  Results for orders placed during the hospital encounter of 08/06/13 (from the past 24 hour(s))  GLUCOSE, CAPILLARY     Status: None   Collection Time    08/14/13 12:57 PM      Result Value Ref Range   Glucose-Capillary 73  70 - 99 mg/dL  PROTIME-INR     Status: None   Collection Time    08/14/13  2:55 PM      Result Value Ref Range   Prothrombin Time 12.6  11.6 - 15.2 seconds   INR 0.96  0.00 - 1.49  GLUCOSE, CAPILLARY     Status: None   Collection Time    08/14/13  4:24 PM      Result Value Ref Range   Glucose-Capillary 79  70 - 99 mg/dL  GLUCOSE, CAPILLARY     Status: None   Collection Time    08/14/13 11:02 PM      Result Value Ref Range   Glucose-Capillary 89  70 - 99 mg/dL  GLUCOSE, CAPILLARY     Status: None   Collection Time    08/15/13  5:54 AM      Result Value Ref Range   Glucose-Capillary 95  70 - 99 mg/dL    Intake/Output Summary (Last 24 hours) at 08/15/13 0954 Last data filed at 08/14/13 1200  Gross per 24 hour  Intake      0 ml  Output   3500 ml  Net  -3500 ml    ASSESSMENT AND PLAN:  CAD:  Cath yesterday.  Medical management of CAD.    ACUTE ON CHRONIC SYSTOLIC AND DIASTOLIC HF:   Volume management per dialysis  ATRIAL FLUTTER:  NSR on telemetry.   Per Dr. Theodosia Blender  note of 2/22 we do not plan long term anticoagulation.  No need for full dose anticoagulation.  DVT prophylaxis per primary team.   ESRD:  Continue therapy per renal.    Minus Breeding 08/15/2013 9:54 AM

## 2013-08-15 NOTE — Progress Notes (Signed)
Physical Therapy Treatment Patient Details Name: Victor Lane MRN: [id number] DOB: 25-Dec-1945 Today's Date: 08/15/2013 Time: 0900-0919 PT Time Calculation (min): 19 min  PT Assessment / Plan / Recommendation  History of Present Illness Adm with sepsis (after PD catheter inserted for dialysis 2/10). Now with MI, flu, aflutter. PMHx DM, HD MWF, peripheral neuropathy   PT Comments   Pt able to gait 200' today with frequent standing rest breaks, no LOB, cues for deep breathing.  Pt will benefit from outpatient PT to continue to improve strength and activity tolerance.  Follow Up Recommendations  Outpatient PT;Supervision - Intermittent     Does the patient have the potential to tolerate intense rehabilitation     Barriers to Discharge        Equipment Recommendations  None recommended by PT    Recommendations for Other Services    Frequency Min 3X/week   Progress towards PT Goals Progress towards PT goals: Progressing toward goals  Plan Current plan remains appropriate    Precautions / Restrictions Precautions Precautions: Fall Restrictions Weight Bearing Restrictions: No   Pertinent Vitals/Pain No c/o pain, spO2 90-92% during treatment on 2LO2    Mobility  Transfers Equipment used: Rolling walker (2 wheeled) Transfers: Sit to/from Stand Sit to Stand: Supervision General transfer comment: Pt able to stand without assist, cues for deep breathing Ambulation/Gait Ambulation/Gait assistance: Supervision Ambulation Distance (Feet): 200 Feet Assistive device: Rolling walker (2 wheeled) Gait velocity interpretation: Below normal speed for age/gender General Gait Details: pt requires frequent standing rest breaks, cues for deep breathing, no LOB, slow cadence.  spO2 90-92% throughout treatment on 2LO2    Exercises     PT Diagnosis:    PT Problem List:   PT Treatment Interventions:     PT Goals (current goals can now be found in the care plan section)    Visit  Information  Last PT Received On: 08/15/13 Assistance Needed: +1 History of Present Illness: Adm with sepsis (after PD catheter inserted for dialysis 2/10). Now with MI, flu, aflutter. PMHx DM, HD MWF, peripheral neuropathy    Subjective Data      Cognition  Cognition Arousal/Alertness: Awake/alert Behavior During Therapy: WFL for tasks assessed/performed Overall Cognitive Status: Within Functional Limits for tasks assessed    Balance     End of Session PT - End of Session Equipment Utilized During Treatment: Oxygen Activity Tolerance: Patient tolerated treatment well Patient left: in bed;with call bell/phone within reach;with bed alarm set Nurse Communication: Mobility status   GP     Judie Hollick 08/15/2013, 9:59 AM

## 2013-08-15 NOTE — Progress Notes (Signed)
ANTIBIOTIC CONSULT NOTE - FOLLOW UP  Pharmacy Consult for Vancomycin, Tressie Ellis Indication: HCAP, sepsis, peritionitis   Allergies  Allergen Reactions  . Amoxicillin     Causes upset stomach  . Fentanyl     Confusion when patch applied  . Nsaids Other (See Comments)    Severe kidney disease/ESRD    Recent Labs  08/14/13 0415 08/14/13 0900  WBC 4.8  --   HGB 12.8*  --   PLT 154  --   CREATININE  --  5.14*   Estimated Creatinine Clearance: 13.7 ml/min (by C-G formula based on Cr of 5.14). No results found for this basename: VANCOTROUGH, VANCOPEAK, VANCORANDOM, [place], [place], [place], [place], TOBRAPEAK, TOBRARND, AMIKACINPEAK, AMIKACINTROU, AMIKACIN,  in the last 72 hours   Microbiology: Blood cx x2 ngtd RVP: (+) Influenza A - treatment complete Urine cx; ngtd  Assessment: 68yo male with peritionitis, sepsis now on Vancomycin and Fortaz.  Pt remains afebrile, without leukocytosis. Pt normally has HD MWF, received extra dialysis sessions over the weekend, therefore additional doses of vanc and ceftaz were added.     Ceftazidime 2/18 >> Vancomycin 2/15>> Flagyl 2/15 >> 2/18 Tamiflu 2/17 >>2/21 Aztreonam 2/15 >>2/18  Goal of Therapy:  Pre-HD Vancomycin level 15-25   Plan:  -Continue Vancomycin 750 mg iv after each HD session  -Continue Fortaz 2g IV q HD -Vanc random level 2/25 before HD -Anticipated abx end date: 08/20/13  Hughes Better, PharmD, BCPS Clinical Pharmacist Pager: [phone] 08/15/2013 2:21 PM

## 2013-08-15 NOTE — Progress Notes (Signed)
Discharged to home with family office visits in place teaching done  

## 2013-08-16 ENCOUNTER — Encounter (INDEPENDENT_AMBULATORY_CARE_PROVIDER_SITE_OTHER): Payer: Medicare Other | Admitting: Surgery

## 2013-08-22 ENCOUNTER — Encounter: Payer: Self-pay | Admitting: Cardiology

## 2013-08-25 NOTE — Discharge Summary (Signed)
Physician Discharge Summary  Patient ID: Victor Lane MRN: [id number] DOB/AGE: Jul 07, 1945 68 y.o.  Admit date: 08/01/2013 Discharge date: 08/02/2013  Admission Diagnoses:  Discharge Diagnoses:  Principal Problem:   Acute respiratory failure Active Problems:   Diabetes mellitus   HTN (hypertension)   ESRD (end stage renal disease) on dialysis   Other malaise and fatigue   COPD GOLD II   Discharged Condition: fair  Hospital Course: Pleasant patient with chronic kidney disease.  Laparoscopic peritoneal dialysis catheter placed on day of admission.  Was expected to go home.  Had to be reintubated in the recovery room.  Was watched overnight.  Extubated.  Patient improved and wished to go home on postoperative day one.  He was seen by nephrology felt it was safe to be discharged after hemodialysis done inpatient  Consults: nephrology  Significant Diagnostic Studies:   Treatments: dialysis: Hemodialysis  Discharge Exam: Blood pressure 123/48, pulse 83, temperature 97.1 F (36.2 C), temperature source Oral, resp. rate 18, height 6' (1.829 m), weight 173 lb 15.1 oz (78.9 kg), SpO2 99.00%.  General: Pt awakens to follow commands. In no acute distress  Eyes: PERRL, normal EOM. Sclera clear. No icterus  Neuro: CN II-XII intact w/o focal sensory/motor deficits.  Lymph: No head/neck/groin lymphadenopathy  Psych: No delerium/psychosis/paranoia  HENT: Normocephalic, Mucus membranes moist. No thrush.   Neck: Supple, No tracheal deviation  Chest: No chest wall pain w good excursion  CV: Pulses intact. Regular rhythm  MS: Normal AROM mjr joints. No obvious deformity  Abdomen: Soft. Nondistended. Nontender. Dressing c/d/i. No evidence of peritonitis. No incarcerated hernias.  Ext: SCDs BLE. No mjr edema. No cyanosis  Skin: No petechiae / purpura   Disposition: 01-Home or Self Care  Discharge Orders   Future Appointments Provider Department Dept Phone   11/24/2013 3:00 PM Eppie Gibson, MD [facility] [phone]   Future Orders Complete By Expires   Call MD for:  extreme fatigue  As directed    Call MD for:  hives  As directed    Call MD for:  persistant nausea and vomiting  As directed    Call MD for:  redness, tenderness, or signs of infection (pain, swelling, redness, odor or green/yellow discharge around incision site)  As directed    Call MD for:  severe uncontrolled pain  As directed    Call MD for:  As directed    Comments:     Temperature > 101.1F   Diet - low sodium heart healthy  As directed    Diet - low sodium heart healthy  As directed    Discharge instructions  As directed    Comments:     Please see discharge instruction sheets.  Also refer to handout given an office.  Please call our office if you have any questions or concerns [phone]   Discharge wound care:  As directed    Comments:     If you have closed incisions, shower and bathe over these incisions with soap and water every day.  Remove all surgical dressings on postoperative day #3.  You do not need to replace dressings over the closed incisions unless you feel more comfortable with a Band-Aid covering it.   If you have an open wound that requires packing, please see wound care instructions.  In general, remove all dressings, wash wound with soap and water and then replace with saline moistened gauze.  Do the dressing change at least every day.  Please call our office [phone] if you have further questions.   Driving Restrictions  As directed    Comments:     No driving until off narcotics and can safely swerve away without pain during an emergency   Increase activity slowly  As directed    Comments:     Walk an hour a day.  Use 20-30 minute walks.  When you can walk 30 minutes without difficulty, increase to low impact/moderate activities such as biking, jogging, swimming, sexual activity..  Eventually can increase to unrestricted activity when  not feeling pain.  If you feel pain: STOP!Marland Kitchen   Let pain protect you from overdoing it.  Use ice/heat/over-the-counter pain medications to help minimize his soreness.  Use pain prescriptions as needed to remain active.  It is better to take extra pain medications and be more active than to stay bedridden to avoid all pain medications.   Increase activity slowly  As directed    Lifting restrictions  As directed    Comments:     Avoid heavy lifting initially.  Do not push through pain.  You have no specific weight limit.  Coughing and sneezing or four more stressful to your incision than any lifting you will do. Pain will protect you from injury.  Therefore, avoid intense activity until off all narcotic pain medications.  Coughing and sneezing or four more stressful to your incision than any lifting he will do.   May shower / Bathe  As directed    May walk up steps  As directed    Sexual Activity Restrictions  As directed    Comments:     Sexual activity as tolerated.  Do not push through pain.  Pain will protect you from injury.   Walk with assistance  As directed    Comments:     Walk over an hour a day.  May use a walker/cane/companion to help with balance and stamina.       Medication List    STOP taking these medications       mupirocin ointment 2 %  Commonly known as:  BACTROBAN      TAKE these medications       aspirin 81 MG tablet  Take 81 mg by mouth daily.     calcium acetate 667 MG capsule  Commonly known as:  PHOSLO  Take 1,334 mg by mouth 3 (three) times daily with meals.     diphenhydrAMINE 25 MG tablet  Commonly known as:  BENADRYL  Take 50 mg by mouth every 6 (six) hours as needed for itching.     esomeprazole 40 MG capsule  Commonly known as:  NEXIUM  Take 40 mg by mouth daily before breakfast.     feeding supplement (NEPRO CARB STEADY) Liqd  Take 237 mLs by mouth 2 (two) times daily between meals.     glipiZIDE 2.5 MG 24 hr tablet  Commonly known as:   GLUCOTROL XL  Take 2.5 mg by mouth daily as needed (diabetes).     metoprolol tartrate 25 MG tablet  Commonly known as:  LOPRESSOR  Take 0.5 tablets (12.5 mg total) by mouth 2 (two) times daily.     multivitamin Tabs tablet  Take 1 tablet by mouth daily.     traMADol 50 MG tablet  Commonly known as:  ULTRAM  Take 1-2 tablets (50-100 mg total) by mouth every 6 (six) hours as needed for moderate pain or severe pain.     triamcinolone cream  0.1 %  Commonly known as:  KENALOG  Apply 1 application topically 2 (two) times daily. Apply to knee and lower leg.           Follow-up Information   Follow up with [name]., MD. (As needed)    Specialty:  General Surgery   Contact information:   [place] [address] [phone]       Follow up with Dialysis Nurses In 3 days. (To remove dressings. Anticipate CAPD catheter flushes next week - appt 2/17 per pt's wife)       Signed: Yanique Mulvihill C. 08/25/2013, 11:31 AM

## 2013-08-29 ENCOUNTER — Inpatient Hospital Stay (HOSPITAL_COMMUNITY)
Admission: EM | Admit: 2013-08-29 | Discharge: 2013-08-30 | DRG: 291 | Disposition: A | Discharge status: Home / Self Care | Payer: Medicare Other | Attending: Internal Medicine | Admitting: Internal Medicine

## 2013-08-29 ENCOUNTER — Emergency Department (HOSPITAL_COMMUNITY): Payer: Medicare Other

## 2013-08-29 ENCOUNTER — Encounter (HOSPITAL_COMMUNITY): Payer: Self-pay | Admitting: Emergency Medicine

## 2013-08-29 DIAGNOSIS — D696 Thrombocytopenia, unspecified: Secondary | ICD-10-CM | POA: Diagnosis present

## 2013-08-29 DIAGNOSIS — I5043 Acute on chronic combined systolic (congestive) and diastolic (congestive) heart failure: Principal | ICD-10-CM | POA: Diagnosis present

## 2013-08-29 DIAGNOSIS — Z7982 Long term (current) use of aspirin: Secondary | ICD-10-CM

## 2013-08-29 DIAGNOSIS — N2581 Secondary hyperparathyroidism of renal origin: Secondary | ICD-10-CM | POA: Diagnosis present

## 2013-08-29 DIAGNOSIS — M899 Disorder of bone, unspecified: Secondary | ICD-10-CM | POA: Diagnosis present

## 2013-08-29 DIAGNOSIS — I252 Old myocardial infarction: Secondary | ICD-10-CM

## 2013-08-29 DIAGNOSIS — E8770 Fluid overload, unspecified: Secondary | ICD-10-CM

## 2013-08-29 DIAGNOSIS — IMO0001 Reserved for inherently not codable concepts without codable children: Secondary | ICD-10-CM | POA: Diagnosis present

## 2013-08-29 DIAGNOSIS — J4489 Other specified chronic obstructive pulmonary disease: Secondary | ICD-10-CM | POA: Diagnosis present

## 2013-08-29 DIAGNOSIS — D638 Anemia in other chronic diseases classified elsewhere: Secondary | ICD-10-CM

## 2013-08-29 DIAGNOSIS — R079 Chest pain, unspecified: Secondary | ICD-10-CM

## 2013-08-29 DIAGNOSIS — Z992 Dependence on renal dialysis: Secondary | ICD-10-CM

## 2013-08-29 DIAGNOSIS — R0902 Hypoxemia: Secondary | ICD-10-CM

## 2013-08-29 DIAGNOSIS — I2582 Chronic total occlusion of coronary artery: Secondary | ICD-10-CM | POA: Diagnosis present

## 2013-08-29 DIAGNOSIS — Z87891 Personal history of nicotine dependence: Secondary | ICD-10-CM

## 2013-08-29 DIAGNOSIS — I4892 Unspecified atrial flutter: Secondary | ICD-10-CM

## 2013-08-29 DIAGNOSIS — M949 Disorder of cartilage, unspecified: Secondary | ICD-10-CM

## 2013-08-29 DIAGNOSIS — R9431 Abnormal electrocardiogram [ECG] [EKG]: Secondary | ICD-10-CM

## 2013-08-29 DIAGNOSIS — Z79899 Other long term (current) drug therapy: Secondary | ICD-10-CM

## 2013-08-29 DIAGNOSIS — G609 Hereditary and idiopathic neuropathy, unspecified: Secondary | ICD-10-CM | POA: Diagnosis present

## 2013-08-29 DIAGNOSIS — Z9981 Dependence on supplemental oxygen: Secondary | ICD-10-CM

## 2013-08-29 DIAGNOSIS — R5381 Other malaise: Secondary | ICD-10-CM | POA: Diagnosis present

## 2013-08-29 DIAGNOSIS — E8779 Other fluid overload: Secondary | ICD-10-CM

## 2013-08-29 DIAGNOSIS — N186 End stage renal disease: Secondary | ICD-10-CM

## 2013-08-29 DIAGNOSIS — I509 Heart failure, unspecified: Secondary | ICD-10-CM

## 2013-08-29 DIAGNOSIS — K219 Gastro-esophageal reflux disease without esophagitis: Secondary | ICD-10-CM | POA: Diagnosis present

## 2013-08-29 DIAGNOSIS — I120 Hypertensive chronic kidney disease with stage 5 chronic kidney disease or end stage renal disease: Secondary | ICD-10-CM | POA: Diagnosis present

## 2013-08-29 DIAGNOSIS — R5383 Other fatigue: Secondary | ICD-10-CM

## 2013-08-29 DIAGNOSIS — E119 Type 2 diabetes mellitus without complications: Secondary | ICD-10-CM

## 2013-08-29 DIAGNOSIS — J9610 Chronic respiratory failure, unspecified whether with hypoxia or hypercapnia: Secondary | ICD-10-CM

## 2013-08-29 DIAGNOSIS — J449 Chronic obstructive pulmonary disease, unspecified: Secondary | ICD-10-CM | POA: Diagnosis present

## 2013-08-29 DIAGNOSIS — R531 Weakness: Secondary | ICD-10-CM

## 2013-08-29 DIAGNOSIS — N281 Cyst of kidney, acquired: Secondary | ICD-10-CM | POA: Diagnosis present

## 2013-08-29 DIAGNOSIS — D649 Anemia, unspecified: Secondary | ICD-10-CM | POA: Diagnosis present

## 2013-08-29 DIAGNOSIS — I2510 Atherosclerotic heart disease of native coronary artery without angina pectoris: Secondary | ICD-10-CM | POA: Diagnosis present

## 2013-08-29 LAB — CBC WITH DIFFERENTIAL/PLATELET
BASOS ABS: 0 10*3/uL (ref 0.0–0.1)
Basophils Relative: 1 % (ref 0–1)
Eosinophils Absolute: 0.1 10*3/uL (ref 0.0–0.7)
Eosinophils Relative: 1 % (ref 0–5)
HEMATOCRIT: 32 % — AB (ref 39.0–52.0)
Hemoglobin: 10.5 g/dL — ABNORMAL LOW (ref 13.0–17.0)
LYMPHS PCT: 11 % — AB (ref 12–46)
Lymphs Abs: 0.7 10*3/uL (ref 0.7–4.0)
MCH: 31.3 pg (ref 26.0–34.0)
MCHC: 32.8 g/dL (ref 30.0–36.0)
MCV: 95.5 fL (ref 78.0–100.0)
MONO ABS: 0.7 10*3/uL (ref 0.1–1.0)
Monocytes Relative: 10 % (ref 3–12)
NEUTROS ABS: 5 10*3/uL (ref 1.7–7.7)
NEUTROS PCT: 77 % (ref 43–77)
Platelets: 187 10*3/uL (ref 150–400)
RBC: 3.35 MIL/uL — ABNORMAL LOW (ref 4.22–5.81)
RDW: 18.8 % — AB (ref 11.5–15.5)
WBC: 6.4 10*3/uL (ref 4.0–10.5)

## 2013-08-29 LAB — HEPATIC FUNCTION PANEL
ALT: 15 U/L (ref 0–53)
AST: 36 U/L (ref 0–37)
Albumin: 2.6 g/dL — ABNORMAL LOW (ref 3.5–5.2)
Alkaline Phosphatase: 105 U/L (ref 39–117)
Bilirubin, Direct: 0.4 mg/dL — ABNORMAL HIGH (ref 0.0–0.3)
Indirect Bilirubin: 0.4 mg/dL (ref 0.3–0.9)
Total Bilirubin: 0.8 mg/dL (ref 0.3–1.2)
Total Protein: 6.6 g/dL (ref 6.0–8.3)

## 2013-08-29 LAB — HAPTOGLOBIN: Haptoglobin: 248 mg/dL — ABNORMAL HIGH (ref 45–215)

## 2013-08-29 LAB — BASIC METABOLIC PANEL
BUN: 36 mg/dL — ABNORMAL HIGH (ref 6–23)
CHLORIDE: 86 meq/L — AB (ref 96–112)
CO2: 25 meq/L (ref 19–32)
Calcium: 9.2 mg/dL (ref 8.4–10.5)
Creatinine, Ser: 4.39 mg/dL — ABNORMAL HIGH (ref 0.50–1.35)
GFR calc non Af Amer: 13 mL/min — ABNORMAL LOW (ref >90)
GFR, EST AFRICAN AMERICAN: 15 mL/min — AB (ref >90)
Glucose, Bld: 102 mg/dL — ABNORMAL HIGH (ref 70–99)
POTASSIUM: 5.6 meq/L — AB (ref 3.7–5.3)
SODIUM: 132 meq/L — AB (ref 137–147)

## 2013-08-29 LAB — AMMONIA: Ammonia: 25 umol/L (ref 11–60)

## 2013-08-29 LAB — MRSA PCR SCREENING: MRSA by PCR: NEGATIVE

## 2013-08-29 LAB — LACTATE DEHYDROGENASE: LDH: 246 U/L (ref 94–250)

## 2013-08-29 LAB — GLUCOSE, CAPILLARY
GLUCOSE-CAPILLARY: 70 mg/dL (ref 70–99)
Glucose-Capillary: 87 mg/dL (ref 70–99)

## 2013-08-29 LAB — TROPONIN I: Troponin I: 2.23 ng/mL (ref <0.30)

## 2013-08-29 MED ORDER — ONDANSETRON HCL 4 MG/2ML IJ SOLN: 4.0000 mg | Freq: Four times a day (QID) | INTRAMUSCULAR | Status: DC | PRN

## 2013-08-29 MED ORDER — TRAMADOL HCL 50 MG PO TABS: 50.0000 mg | ORAL_TABLET | Freq: Four times a day (QID) | ORAL | Status: DC | PRN

## 2013-08-29 MED ORDER — DARBEPOETIN ALFA-POLYSORBATE 100 MCG/0.5ML IJ SOLN
100.0000 ug | INTRAMUSCULAR | Status: DC
  Administered 2013-08-30: 100 ug via INTRAVENOUS
  Filled 2013-08-29: qty 0.5

## 2013-08-29 MED ORDER — ACETAMINOPHEN 650 MG RE SUPP: 650.0000 mg | Freq: Four times a day (QID) | RECTAL | Status: DC | PRN

## 2013-08-29 MED ORDER — MIDODRINE HCL 5 MG PO TABS
10.0000 mg | ORAL_TABLET | ORAL | Status: DC
  Administered 2013-08-30: 10 mg via ORAL
  Filled 2013-08-29: qty 2

## 2013-08-29 MED ORDER — METOPROLOL TARTRATE 12.5 MG HALF TABLET
12.5000 mg | ORAL_TABLET | Freq: Two times a day (BID) | ORAL | Status: DC
  Administered 2013-08-30 (×2): 12.5 mg via ORAL
  Filled 2013-08-29 (×4): qty 1

## 2013-08-29 MED ORDER — DOXERCALCIFEROL 4 MCG/2ML IV SOLN
3.0000 ug | INTRAVENOUS | Status: DC
  Administered 2013-08-30: 3 ug via INTRAVENOUS
  Filled 2013-08-29: qty 2

## 2013-08-29 MED ORDER — RENA-VITE PO TABS
1.0000 | ORAL_TABLET | Freq: Every day | ORAL | Status: DC
  Administered 2013-08-30: 1 via ORAL
  Filled 2013-08-29 (×3): qty 1

## 2013-08-29 MED ORDER — SODIUM CHLORIDE 0.9 % IJ SOLN
3.0000 mL | Freq: Two times a day (BID) | INTRAMUSCULAR | Status: DC
  Administered 2013-08-30 (×2): 3 mL via INTRAVENOUS

## 2013-08-29 MED ORDER — NEPRO/CARBSTEADY PO LIQD
237.0000 mL | Freq: Two times a day (BID) | ORAL | Status: DC
  Administered 2013-08-30: 237 mL via ORAL

## 2013-08-29 MED ORDER — INSULIN ASPART 100 UNIT/ML ~~LOC~~ SOLN: 0.0000 [IU] | Freq: Three times a day (TID) | SUBCUTANEOUS | Status: DC

## 2013-08-29 MED ORDER — MIDODRINE HCL 5 MG PO TABS
10.0000 mg | ORAL_TABLET | Freq: Once | ORAL | Status: AC
  Administered 2013-08-29: 10 mg via ORAL
  Filled 2013-08-29: qty 2

## 2013-08-29 MED ORDER — MIDODRINE HCL 5 MG PO TABS
ORAL_TABLET | ORAL | Status: AC
  Filled 2013-08-29: qty 2

## 2013-08-29 MED ORDER — ASPIRIN EC 81 MG PO TBEC
81.0000 mg | DELAYED_RELEASE_TABLET | Freq: Every day | ORAL | Status: DC
  Administered 2013-08-30 (×2): 81 mg via ORAL
  Filled 2013-08-29 (×3): qty 1

## 2013-08-29 MED ORDER — CALCIUM ACETATE 667 MG PO CAPS
1334.0000 mg | ORAL_CAPSULE | Freq: Three times a day (TID) | ORAL | Status: DC
  Administered 2013-08-30: 1334 mg via ORAL
  Filled 2013-08-29 (×4): qty 2

## 2013-08-29 MED ORDER — ONDANSETRON HCL 4 MG PO TABS: 4.0000 mg | ORAL_TABLET | Freq: Four times a day (QID) | ORAL | Status: DC | PRN

## 2013-08-29 MED ORDER — ASPIRIN 81 MG PO TABS: 81.0000 mg | ORAL_TABLET | Freq: Every day | ORAL | Status: DC

## 2013-08-29 MED ORDER — ACETAMINOPHEN 325 MG PO TABS: 650.0000 mg | ORAL_TABLET | Freq: Four times a day (QID) | ORAL | Status: DC | PRN

## 2013-08-29 MED ORDER — PANTOPRAZOLE SODIUM 40 MG PO TBEC
80.0000 mg | DELAYED_RELEASE_TABLET | Freq: Every day | ORAL | Status: DC
  Administered 2013-08-30: 80 mg via ORAL

## 2013-08-29 MED ORDER — HEPARIN SODIUM (PORCINE) 5000 UNIT/ML IJ SOLN
5000.0000 [IU] | Freq: Three times a day (TID) | INTRAMUSCULAR | Status: DC
  Administered 2013-08-30 (×3): 5000 [IU] via SUBCUTANEOUS
  Filled 2013-08-29 (×6): qty 1

## 2013-08-29 NOTE — Consult Note (Signed)
[facility] Renal Consultation Note  Indication for Consultation:  Management of ESRD/hemodialysis; anemia, hypertension/volume and secondary hyperparathyroidism  HPI: Victor Lane is a 68 y.o. male presented to ER cos severe weakness/ tired since having hd yesterday [facility]. He had an uneventful treatment per RN report from center= leaving at his edw 70 kg wt / bp pre hd 114/65 and post hd bp 153/74 afebrile . No win ER mildly Hypoxic 94%  And CXR  Showing pulmonary interstitial edema/aveolar edema with sm bilat pleural effusions, He has sig history for recent admit Feb 15-24 with NSTEMI / acut/chronic chf with resp distress/ Multivesel CAD by cath Med tx EF= 38% / Peritonitis( 2-10 15 pd cath inserted ) at home pt had pulled portion of pd cath out with fluid leaking around cath. with vanco /fortaz recent finishe 08/21/13 at disylsis  , Influenza A +, A. Flutter transient/ Thrombocytopenia.  Currently with o2 on 97%  No sob or chest pain.    Noted he tells me next pd flush at home training s this Thursday , last Thursday  No troubles with pd cath flushing and he does not yet have a time to start pd.    Past Medical History  Diagnosis Date  . Chest pain     non-ischemic stress test 02/2011 (Dr. Radford Pax)  . Carotid artery occlusion     s/p CEA  . Neuropathy   . Arthritis   . H/O vitamin D deficiency   . Hearing loss   . Hypercholesteremia   . 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 S/P radiation therapy 01/24/2013-03/14/2013    Larynx/glottis / 63 Gy in 28 fractions  . Hypertension   . GERD (gastroesophageal reflux disease)   . Complication of anesthesia     hard to wake up and then panics waking up  . Sleep apnea     to request from Dr.Fried was done about 44yrs ago but no cpap d/t weight loss  . Fibromyalgia     takes tramadol daily as needed  . Myalgia and myositis   . Peripheral neuropathy   .  History of gout   . History of colon polyps   . ESRD on dialysis     - Stage 5- follwed by Dr Sharlotte Alamo in [place]  . Diabetes mellitus   . Cataracts, bilateral   . COPD (chronic obstructive pulmonary disease)     Past Surgical History  Procedure Laterality Date  . Cholecystectomy  1993  . Rotator cuff repair  07/06/2011    right  . Carotid endarterectomy Left   . Coolonoscopy    . Colonoscopy w/ biopsies and polypectomy      Hx: of  . Microlaryngoscopy with laser N/A 01/04/2013    Procedure: MICROLARYNGOSCOPY WITH BIOPSY/LASER;  Surgeon: [name], MD;  Location: [place];  Service: ENT;  Laterality: N/A;  . Av fistula placement Left 02/23/2013    Procedure: ARTERIOVENOUS (AV) FISTULA CREATION;  Surgeon: Conrad , MD;  Location: [place];  Service: Vascular;  Laterality: Left;  . Capd insertion N/A 08/01/2013    Procedure: LAPAROSCOPIC INSERTION CONTINUOUS AMBULATORY PERITONEAL DIALYSIS CATHETER;  Surgeon: [name], MD;  Location: [place];  Service: General;  Laterality: N/A;  . Laparoscopic abdominal exploration N/A 08/01/2013    Procedure: LAPAROSCOPIC ABDOMINAL EXPLORATION;  Surgeon: [name], MD;  Location: [place];  Service: General;  Laterality:  N/A;      Family History  Problem Relation Age of Onset  . Adopted: Yes   Soc = married lives with wife and son nearby   reports that he quit smoking about 18 years ago. His smoking use included Cigarettes. He has a 62 pack-year smoking history. He has never used smokeless tobacco. He reports that he does not drink alcohol or use illicit drugs.   Allergies  Allergen Reactions  . Amoxicillin     Causes upset stomach  . Fentanyl     Confusion when patch applied  . Nsaids Other (See Comments)    Severe kidney disease/ESRD    Prior to Admission medications   Medication Sig Start Date End Date Taking? Authorizing Provider  aspirin 81 MG tablet Take 81 mg by mouth daily.     Historical Provider, MD  calcium acetate  (PHOSLO) 667 MG capsule Take 1,334 mg by mouth 3 (three) times daily with meals.    Historical Provider, MD  dextrose 5 % SOLN 50 mL with cefTAZidime 2 G SOLR 2 g Inject 2 g into the vein every Monday, Wednesday, and Friday with hemodialysis. Last dose on HD on 08/21/13 08/15/13   Orson Eva, MD  diphenhydrAMINE (BENADRYL) 25 MG tablet Take 50 mg by mouth every 6 (six) hours as needed for itching.     Historical Provider, MD  esomeprazole (NEXIUM) 40 MG capsule Take 40 mg by mouth daily before breakfast.    Historical Provider, MD  glipiZIDE (GLUCOTROL XL) 2.5 MG 24 hr tablet Take 2.5 mg by mouth daily as needed (diabetes).    Historical Provider, MD  metoprolol tartrate (LOPRESSOR) 25 MG tablet Take 0.5 tablets (12.5 mg total) by mouth 2 (two) times daily. 02/25/13   Tammy S Parrett, NP  multivitamin (RENA-VIT) TABS tablet Take 1 tablet by mouth daily.     Historical Provider, MD  Nutritional Supplements (FEEDING SUPPLEMENT, NEPRO CARB STEADY,) LIQD Take 237 mLs by mouth 2 (two) times daily between meals. 05/24/13   Zerita Boers, MD  traMADol (ULTRAM) 50 MG tablet Take 1-2 tablets (50-100 mg total) by mouth every 6 (six) hours as needed for moderate pain or severe pain. 08/01/13   [name], MD  triamcinolone cream (KENALOG) 0.1 % Apply 1 application topically 2 (two) times daily. Apply to knee and lower leg. 07/18/13   Historical Provider, MD  Vancomycin (VANCOCIN) 750 MG/150ML SOLN Inject 150 mLs (750 mg total) into the vein every Monday, Wednesday, and Friday with hemodialysis. Last dose on HD on 08/21/13 08/15/13   Orson Eva, MD      Results for orders placed during the hospital encounter of 08/29/13 (from the past 48 hour(s))  CBC WITH DIFFERENTIAL     Status: Abnormal   Collection Time    08/29/13  1:15 PM      Result Value Ref Range   WBC 6.4  4.0 - 10.5 K/uL   RBC 3.35 (*) 4.22 - 5.81 MIL/uL   Hemoglobin 10.5 (*) 13.0 - 17.0 g/dL   HCT 32.0 (*) 39.0 - 52.0 %   MCV 95.5  78.0  - 100.0 fL   MCH 31.3  26.0 - 34.0 pg   MCHC 32.8  30.0 - 36.0 g/dL   RDW 18.8 (*) 11.5 - 15.5 %   Platelets 187  150 - 400 K/uL   Neutrophils Relative % 77  43 - 77 %   Neutro Abs 5.0  1.7 - 7.7 K/uL   Lymphocytes  Relative 11 (*) 12 - 46 %   Lymphs Abs 0.7  0.7 - 4.0 K/uL   Monocytes Relative 10  3 - 12 %   Monocytes Absolute 0.7  0.1 - 1.0 K/uL   Eosinophils Relative 1  0 - 5 %   Eosinophils Absolute 0.1  0.0 - 0.7 K/uL   Basophils Relative 1  0 - 1 %   Basophils Absolute 0.0  0.0 - 0.1 K/uL  BASIC METABOLIC PANEL     Status: Abnormal   Collection Time    08/29/13  1:15 PM      Result Value Ref Range   Sodium 132 (*) 137 - 147 mEq/L   Potassium 5.6 (*) 3.7 - 5.3 mEq/L   Comment: SLIGHT HEMOLYSIS   Chloride 86 (*) 96 - 112 mEq/L   CO2 25  19 - 32 mEq/L   Glucose, Bld 102 (*) 70 - 99 mg/dL   BUN 36 (*) 6 - 23 mg/dL   Creatinine, Ser 4.39 (*) 0.50 - 1.35 mg/dL   Calcium 9.2  8.4 - 10.5 mg/dL   GFR calc non Af Amer 13 (*) >90 mL/min   GFR calc Af Amer 15 (*) >90 mL/min   Comment: (NOTE)     The eGFR has been calculated using the CKD EPI equation.     This calculation has not been validated in all clinical situations.     eGFR's persistently <90 mL/min signify possible Chronic Kidney     Disease.  .  ROS: only positives as in hpi   Physical Exam: Filed Vitals:   08/29/13 1633  BP:   Pulse: 89  Temp: 98.1 F (36.7 C)  Resp: 17     General:alert thin white male NAD HEENT: [place], none icteric eomi , mmm Neck: no masses, jvd pos. Heart: RRR, 1/6 sem , no rub Lungs: Faint rales Lbase. Abdomen: Bspos. Soft,nontender,nondistended/ PD cath in place no dc noted Extremities: No  Pedal edema Skin: no overt rash and no noted pedal ulcers Neuro:  Alert OX3 , moves all ext.  Dialysis Access: POS. Bruit L UA AVF with ecchymosis  Dialysis Orders: [facility]: [facility]  on [facility] . EDW 70 kg HD Bath 2.0, 2.25 ca  Time 4 Heparin 2,400. Access LUA AVF BFR 400 DFR 800    Hectorol 3 mcg IV/HD  Epogen 6000   Units IV/HD  Venofer  0    Assessment/Plan 1. Acute/Chronic CHF with Pulm edema = UF for volume removal tonight / needs lower edw / ? Cardiac event  Check CE 2. ESRD -  Nl schedule MWF  Hd in am to keep on schedule/ PD flushing on thrusday 3. Hypertension/volume  - Midodrine pre hd to help bp support/ lower edw 4. Anemia  - esa on hd  5. Metabolic bone disease -  Vit d and binders  6. CAD= med tx currently, 7. DM =per admit  Ernest Haber, PA-C [place] [phone] 08/29/2013, 4:48 PM  I have seen and examined this patient and agree with plan per Ernest Haber.  Despite HD yest, he CO sob and CXR showing pulm edema.  Will plan UF tonite to pull 2-3L and give midodrine prior to help with BP.  It appears UF is limited as outpt due to hypotension and so plans are to start training for PD on 09/11/13.  Will check cardiac enzymes to make sure no acute event.  EF only 38% with moderate AR so effective EF even less. Breyonna Nault T,MD  08/29/2013 5:30 PM

## 2013-08-29 NOTE — Consult Note (Signed)
Cardiology Consult Note   Patient ID: Victor Lane MRN: [id number], DOB/AGE: 01/19/1946   Admit date: 08/29/2013 Date of Consult: 08/29/2013  Primary Physician: [name], MD Primary Cardiologist:   Reason for consult:  Called re: + Trop, HD patient, no CP, h/o NSTEMI in Feb. 2015/  HPI: Victor Lane is a 68 y.o. male with extensive PMHx of chronic IHD/ICMO/HFrEF=40% s/p NSTEMI in Feb. 2015 with findings of CTO RCA, 70-80% dLAD stenosis, 60% pLCX, 80% small OM2, 90% small mid-Ramus, L-R collaterals.  No intervention was performed due to patient's critical illness at the time and chronic appearance of RCA occlusion.  Recommendation was to consider PCI of the distal LAD lesion in the future if patient has true recurrent angina and ischemia.  Additional PMHx of carotid disease s/p CEA, ESRD on HD, recent admissions in Feb 2015 and Aug 2014 with critical illness.  He was admitted to [facility] today, 08/29/13, for decreased po intake and decreased energy.  He had a troponin checked on admission which was elevated at 2.23 and when rechecked it trended down to 1.33.  He denies any chest pain, dyspnea or palpitations.  His ECG showed no acute ST-T abnormalities.  He underwent HD session this evening 08/29/13.   Problem List: Past Medical History  Diagnosis Date  . Chest pain     non-ischemic stress test 02/2011 (Dr. Radford Pax)  . Carotid artery occlusion     s/p CEA  . Neuropathy   . Arthritis   . H/O vitamin D deficiency   . Hearing loss   . Hypercholesteremia   . 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 S/P radiation therapy 01/24/2013-03/14/2013    Larynx/glottis / 63 Gy in 28 fractions  . Hypertension   . GERD (gastroesophageal reflux disease)   . Complication of anesthesia     hard to wake up and then panics waking up  . Sleep apnea     to request from Dr.Fried was done about 8yrs ago but no cpap d/t weight  loss  . Fibromyalgia     takes tramadol daily as needed  . Myalgia and myositis   . Peripheral neuropathy   . History of gout   . History of colon polyps   . ESRD on dialysis     - Stage 5- follwed by Dr Sharlotte Alamo in [place]  . Diabetes mellitus   . Cataracts, bilateral   . COPD (chronic obstructive pulmonary disease)     Past Surgical History  Procedure Laterality Date  . Cholecystectomy  1993  . Rotator cuff repair  07/06/2011    right  . Carotid endarterectomy Left   . Coolonoscopy    . Colonoscopy w/ biopsies and polypectomy      Hx: of  . Microlaryngoscopy with laser N/A 01/04/2013    Procedure: MICROLARYNGOSCOPY WITH BIOPSY/LASER;  Surgeon: [name], MD;  Location: [place];  Service: ENT;  Laterality: N/A;  . Av fistula placement Left 02/23/2013    Procedure: ARTERIOVENOUS (AV) FISTULA CREATION;  Surgeon: Conrad Rockdale, MD;  Location: [place];  Service: Vascular;  Laterality: Left;  . Capd insertion N/A 08/01/2013    Procedure: LAPAROSCOPIC INSERTION CONTINUOUS AMBULATORY PERITONEAL DIALYSIS CATHETER;  Surgeon: [name], MD;  Location: [place];  Service: General;  Laterality: N/A;  . Laparoscopic abdominal exploration N/A 08/01/2013    Procedure: LAPAROSCOPIC ABDOMINAL EXPLORATION;  Surgeon: Remo Lipps C.  Gross, MD;  Location: [place];  Service: General;  Laterality: N/A;     Allergies:  Allergies  Allergen Reactions  . Amoxicillin     Causes upset stomach  . Fentanyl     Confusion when patch applied  . Nsaids Other (See Comments)    Severe kidney disease/ESRD    Home Medications: Prior to Admission medications   Medication Sig Start Date End Date Taking? Authorizing Provider  aspirin 81 MG tablet Take 81 mg by mouth daily.     Historical Provider, MD  calcium acetate (PHOSLO) 667 MG capsule Take 1,334 mg by mouth 3 (three) times daily with meals.    Historical Provider, MD  dextrose 5 % SOLN 50 mL with cefTAZidime 2 G SOLR 2 g Inject 2 g into the vein every Monday,  Wednesday, and Friday with hemodialysis. Last dose on HD on 08/21/13 08/15/13   Orson Eva, MD  diphenhydrAMINE (BENADRYL) 25 MG tablet Take 50 mg by mouth every 6 (six) hours as needed for itching.     Historical Provider, MD  esomeprazole (NEXIUM) 40 MG capsule Take 40 mg by mouth daily before breakfast.    Historical Provider, MD  glipiZIDE (GLUCOTROL XL) 2.5 MG 24 hr tablet Take 2.5 mg by mouth daily as needed (diabetes).    Historical Provider, MD  metoprolol tartrate (LOPRESSOR) 25 MG tablet Take 0.5 tablets (12.5 mg total) by mouth 2 (two) times daily. 02/25/13   Tammy S Parrett, NP  multivitamin (RENA-VIT) TABS tablet Take 1 tablet by mouth daily.     Historical Provider, MD  Nutritional Supplements (FEEDING SUPPLEMENT, NEPRO CARB STEADY,) LIQD Take 237 mLs by mouth 2 (two) times daily between meals. 05/24/13   Zerita Boers, MD  traMADol (ULTRAM) 50 MG tablet Take 1-2 tablets (50-100 mg total) by mouth every 6 (six) hours as needed for moderate pain or severe pain. 08/01/13   [name], MD  triamcinolone cream (KENALOG) 0.1 % Apply 1 application topically 2 (two) times daily. Apply to knee and lower leg. 07/18/13   Historical Provider, MD  Vancomycin (VANCOCIN) 750 MG/150ML SOLN Inject 150 mLs (750 mg total) into the vein every Monday, Wednesday, and Friday with hemodialysis. Last dose on HD on 08/21/13 08/15/13   Orson Eva, MD    Inpatient Medications:  . aspirin EC  81 mg Oral Daily  . [START ON 08/30/2013] calcium acetate  1,334 mg Oral TID WC  . [START ON 08/30/2013] darbepoetin (ARANESP) injection - DIALYSIS  100 mcg Intravenous Q Wed-HD  . [START ON 08/30/2013] doxercalciferol  3 mcg Intravenous Q M,W,F-HD  . [START ON 08/30/2013] feeding supplement (NEPRO CARB STEADY)  237 mL Oral BID BM  . heparin  5,000 Units Subcutaneous 3 times per day  . [START ON 08/30/2013] insulin aspart  0-9 Units Subcutaneous TID WC  . metoprolol tartrate  12.5 mg Oral BID  . midodrine      .  [START ON 08/30/2013] midodrine  10 mg Oral Q M,W,F-HD  . multivitamin  1 tablet Oral QHS  . [START ON 08/30/2013] pantoprazole  80 mg Oral Q1200  . sodium chloride  3 mL Intravenous Q12H   Prescriptions prior to admission  Medication Sig Dispense Refill  . aspirin 81 MG tablet Take 81 mg by mouth daily.       . calcium acetate (PHOSLO) 667 MG capsule Take 1,334 mg by mouth 3 (three) times daily with meals.      Marland Kitchen dextrose 5 %  SOLN 50 mL with cefTAZidime 2 G SOLR 2 g Inject 2 g into the vein every Monday, Wednesday, and Friday with hemodialysis. Last dose on HD on 08/21/13  1 vial  3  . diphenhydrAMINE (BENADRYL) 25 MG tablet Take 50 mg by mouth every 6 (six) hours as needed for itching.       . esomeprazole (NEXIUM) 40 MG capsule Take 40 mg by mouth daily before breakfast.      . glipiZIDE (GLUCOTROL XL) 2.5 MG 24 hr tablet Take 2.5 mg by mouth daily as needed (diabetes).      . metoprolol tartrate (LOPRESSOR) 25 MG tablet Take 0.5 tablets (12.5 mg total) by mouth 2 (two) times daily.      . multivitamin (RENA-VIT) TABS tablet Take 1 tablet by mouth daily.       . Nutritional Supplements (FEEDING SUPPLEMENT, NEPRO CARB STEADY,) LIQD Take 237 mLs by mouth 2 (two) times daily between meals.    0  . traMADol (ULTRAM) 50 MG tablet Take 1-2 tablets (50-100 mg total) by mouth every 6 (six) hours as needed for moderate pain or severe pain.  40 tablet  0  . triamcinolone cream (KENALOG) 0.1 % Apply 1 application topically 2 (two) times daily. Apply to knee and lower leg.      . Vancomycin (VANCOCIN) 750 MG/150ML SOLN Inject 150 mLs (750 mg total) into the vein every Monday, Wednesday, and Friday with hemodialysis. Last dose on HD on 08/21/13  150 mL  3    Family History  Problem Relation Age of Onset  . Adopted: Yes     History   Social History  . Marital Status: Married    Spouse Name: Fraser Din    Number of Children: 1  . Years of Education: HS   Occupational History  .      Self-Employed    Social History Main Topics  . Smoking status: Former Smoker -- 2.00 packs/day for 31 years    Types: Cigarettes    Quit date: 08/07/1995  . Smokeless tobacco: Never Used     Comment: quit smoking in 1997  . Alcohol Use: No  . Drug Use: No  . Sexual Activity: Yes   Other Topics Concern  . Not on file   Social History Narrative   Patient lives at home with spouse.   Caffiene Use: 1 cup daily     Review of Systems: All other systems reviewed and are otherwise negative except as noted above.  Physical Exam: Blood pressure 113/59, pulse 89, temperature 97.8 F (36.6 C), temperature source Oral, resp. rate 20, height 5\' 10"  (1.778 m), weight 71.7 kg (158 lb 1.1 oz), SpO2 97.00%.   General: A&O x 2, NAD, nontoxic, pleasant/cooperative  Head/Eye: No conjunctival hemorrhage, no icterus, [place]/AT, No nystagmus  ENT: No icterus, No thrush, good dentition, no pharyngeal exudate  Neck: No masses, no lymphadenpathy  CV: RRR, no rub, no gallop, no S3  Lung: Bibasilar crackles. No wheezing. Good air movement.  Abdomen: soft/NT, +BS, nondistended, no peritoneal signs  Ext: No cyanosis, No rashes, No petechiae, No lymphangitis, No edema  Neuro: CNII-XII intact, strength 4/5 in bilateral upper and lower extremities, no dysmetria   Labs: Recent Labs     08/29/13  1315  WBC  6.4  HGB  10.5*  HCT  32.0*  MCV  95.5  PLT  187   No results found for this basename: VITAMINB12, FOLATE, FERRITIN, TIBC, IRON, RETICCTPCT,  in the last 72 hours  No results found for this basename: DDIMER,  in the last 72 hours  Recent Labs Lab 08/29/13 1315 08/29/13 1730  NA 132*  --   K 5.6*  --   CL 86*  --   CO2 25  --   BUN 36*  --   CREATININE 4.39*  --   CALCIUM 9.2  --   PROT  --  6.6  BILITOT  --  0.8  ALKPHOS  --  105  ALT  --  15  AST  --  36  GLUCOSE 102*  --    No results found for this basename: HGBA1C,  in the last 72 hours Recent Labs     08/29/13  1712  TROPONINI  2.23*   No  components found with this basename: POCBNP,  No results found for this basename: CHOL, HDL, LDLCALC, TRIG, CHOLHDL, LDLDIRECT,  in the last 72 hours No results found for this basename: TSH, T4TOTAL, FREET3, T3FREE, THYROIDAB,  in the last 72 hours  Radiology/Studies: Ct Abdomen Pelvis Wo Contrast  08/06/2013   CLINICAL DATA:  Abdominal pain and fever. Recent surgical procedure for peritoneal dialysis 5 days ago.  EXAM: CT ABDOMEN AND PELVIS WITHOUT CONTRAST  TECHNIQUE: Multidetector CT imaging of the abdomen and pelvis was performed following the standard protocol without intravenous contrast.  COMPARISON:  None.  FINDINGS: Lung bases demonstrate a moderate size right pleural effusion and small left pleural effusion with associated airspace opacification which may be due to atelectasis or infection. There is mild cardiomegaly.  Abdominal images demonstrate evidence of a prior cholecystectomy. There is a small amount of perihepatic fluid. There is mild fatty infiltration of the liver with subtle nodular contour. The pancreas and adrenal glands are unremarkable. Appendix is within normal. Kidneys are normal in size with bilateral renal cortical thinning. There is a 6 cm exophytic simple cyst over the mid pole of the left kidney. There are a few small of bilateral hypodensities likely cysts. There is a 1.1 cm homogeneously hyperdense mass over the lower pole of the left kidney with Hounsfield unit measurements 45 likely a hemorrhagic cyst. Ureters are within normal. There is no hydronephrosis or nephrolithiasis. There is diverticulosis of the colon most prominent over the sigmoid colon. There is a peritoneal dialysis catheter entering the left mid abdomen with tip coiled over the pelvis between loops of rectosigmoid colon. There is moderate calcified plaque of the thoracoabdominal aorta and iliac arteries. Minimal free fluid over the right pericolic gutter. No evidence of free peritoneal air.  Remaining pelvic  structures are notable only for moderate fecal retention over the rectosigmoid colon. There is mild degenerative change of the spine and hips.  IMPRESSION: Moderate size right pleural effusion and small left pleural effusion with associated airspace opacification in the lower lobes which may be due to pneumonia or atelectasis.  Peritoneal dialysis catheter with tip coiled over the pelvis between several recto sigmoid colonic loops.  Changes involving the liver which may be due to cirrhosis. Small amount of ascites.  Bilateral renal cortical thinning and bilateral renal cysts. 1.1 cm homogeneously hyperdense left renal mass likely a hemorrhagic cyst. Recommend followup CT 6 months.  Diverticulosis of the colon. Moderate fecal retention over the rectosigmoid colon.  Small hiatal hernia.   Electronically Signed   By: Marin Olp M.D.   On: 08/06/2013 07:57   Dg Chest 2 View  08/29/2013   CLINICAL DATA Chest pain and dyspnea and weakness, status post dialysis; history of vocal  cord malignancy with radiation therapy, history of COPD  EXAM CHEST  2 VIEW  COMPARISON DG CHEST 2 VIEW dated 08/15/2013  FINDINGS The lungs are adequately inflated. The interstitial markings are diffusely increased and areas of confluent alveolar density are present. There small bilateral pleural effusions. The cardiac silhouette is mildly enlarged. The pulmonary vascularity is indistinct. There is no pneumothorax. The observed portions of the bony thorax exhibit no acute abnormalities.  IMPRESSION The findings are consistent with congestive heart failure and pulmonary interstitial and alveolar edema and small bilateral pleural effusions. The appearance of the pulmonary interstitium has not dramatically changed since the previous study.  SIGNATURE  Electronically Signed   By: David  Martinique   On: 08/29/2013 14:25   Dg Chest 2 View  08/15/2013   CLINICAL DATA:  End-stage renal disease.  Post dialysis.  Edema.  EXAM: CHEST  2 VIEW   COMPARISON:  08/12/2013.  FINDINGS: Lungs are hyperexpanded. There is bilateral irregular interstitial thickening similar to the prior exam consistent with interstitial edema. Small bilateral effusions are suggested. No pneumothorax.  Cardiac silhouette is normal in size and configuration. Normal mediastinal and hilar contours.  IMPRESSION: Persistent interstitial pulmonary edema. No change from the recent prior exam. Underlying COPD is suggested by lung hyperexpansion.   Electronically Signed   By: Lajean Manes M.D.   On: 08/15/2013 08:29   Dg Chest 2 View  08/11/2013   CLINICAL DATA:  Pulmonary edema.  EXAM: CHEST  2 VIEW  COMPARISON:  August 07, 2013.  FINDINGS: Stable cardiomegaly. Continued presence of interstitial densities are noted throughout both lungs most consistent with pulmonary edema or less likely pneumonia which is not significantly changed. Mild bilateral pleural effusions are noted. No pneumothorax is noted. Bony thorax is intact.  IMPRESSION: Stable bilateral lung opacities are noted most consistent with pulmonary edema, although pneumonia cannot be excluded. Mild bilateral pleural effusions are noted.   Electronically Signed   By: Sabino Dick M.D.   On: 08/11/2013 10:46   Nm Myocar Single W/spect W/wall Motion And Ef  08/13/2013   CLINICAL DATA:  Chest pain, history hypertension, COPD, diabetes, end-stage renal disease on dialysis, vocal cord cancer post radiation therapy, carotid vascular disease  EXAM: MYOCARDIAL IMAGING WITH SPECT (REST AND PHARMACOLOGIC-STRESS - 2 DAY PROTOCOL)  GATED LEFT VENTRICULAR WALL MOTION STUDY  LEFT VENTRICULAR EJECTION FRACTION  TECHNIQUE: Standard myocardial SPECT imaging was performed after resting intravenous injection of 10 mCi Tc-27m sestamibi. Subsequently, on a second day, intravenous infusion of Lexiscan was performed under the supervision of the Cardiology staff. At peak effect of the drug, 30 mCi Tc-35m sestamibi was injected intravenously and  standard myocardial SPECT imaging was performed. Quantitative gated imaging was also performed to evaluate left ventricular wall motion, and estimate left ventricular ejection fraction.  COMPARISON:  None.  FINDINGS: Myocardial profusion SPECT images obtained following pharmacologic stress demonstrate dilatation of the left ventricle.  Large perfusion defect identified at inferior/inferolateral wall left ventricle.  Small perfusion defect identified at anteroseptal wall left ventricle.  Resting exam demonstrates reperfusion of both of these defects.  No pulmonary uptake of tracer.  Mildly decreased left ventricular ejection fraction of 38% is calculated on gated SPECT images following pharmacologic stress.  This is derived from an end-diastolic volume calculation of 162 mL and an end systolic volume of 99991111 mL.  Wall motion analysis shows no focal wall motion abnormality.  IMPRESSION: Reversible myocardial perfusion defects at the inferior/inferolateral and anteroseptal walls of the left ventricle.  Mildly decreased left ventricular ejection fraction of 38%.  No focal wall motion abnormalities.   Electronically Signed   By: Lavonia Dana M.D.   On: 08/13/2013 14:22   Nm Myocar Single W/spect W/wall Motion And Ef  08/11/2013   CLINICAL DATA:  Chest pain  EXAM: MYOCARDIAL IMAGING WITH SPECT (REST)  TECHNIQUE: Standard myocardial SPECT imaging was performed after resting intravenous injection of 10 mCi Tc-40m sestamibi. Quantitative gated imaging was also performed to evaluate left ventricular wall motion, and estimate left ventricular ejection fraction.  COMPARISON:  None.  FINDINGS: SPECT imaging without gating was performed. There is moderately diminished activity throughout the inferior wall. There is also a perfusion defect at the apex and a final focal mid anteroseptal perfusion defect.  IMPRESSION: Rest imaging only without gating was performed. There is thinning of the inferior wall. There are focal defects at  the apex and mid anteroseptal regions.   Electronically Signed   By: Maryclare Bean M.D.   On: 08/11/2013 11:22   Dg Chest Port 1 View  08/12/2013   CLINICAL DATA:  Followup pulmonary edema.  Status post dialysis.  EXAM: PORTABLE CHEST - 1 VIEW  COMPARISON:  08/11/13  FINDINGS: Asymmetric pulmonary edema shows mild improvement since previous study. Probable small pleural effusions again noted, however there is improved aeration in both lung bases. Heart size is stable.  IMPRESSION: Mild interval improvement in diffuse pulmonary edema. Persistent small pleural effusions.   Electronically Signed   By: Earle Gell M.D.   On: 08/12/2013 13:12   Dg Chest Port 1 View  08/07/2013   CLINICAL DATA:  Shortness of breath, pneumonia  EXAM: PORTABLE CHEST - 1 VIEW  COMPARISON:  DG CHEST 1V PORT dated 08/06/2013; DG CHEST 1V PORT dated 08/02/2013; DG CHEST 2 VIEW dated 04/25/2013; DG CHEST 1V PORT dated 02/16/2013  FINDINGS: Grossly unchanged enlarged cardiac silhouette and mediastinal contours with atherosclerotic plaque within the thoracic aorta. The pulmonary vasculature is indistinct with cephalization of flow. Interval increase in now small layering bilateral effusions with associated worsening bibasilar opacities, left greater than right. No definite evidence of edema. Grossly unchanged bones including presumed resection of the distal end of the right clavicle.  IMPRESSION: Worsening pulmonary edema, small layering bilateral effusions and associated bibasilar opacities, left greater than right, atelectasis versus infiltrate.   Electronically Signed   By: Sandi Mariscal M.D.   On: 08/07/2013 07:36   Dg Chest Port 1 View  08/06/2013   CLINICAL DATA:  68 year old male with shortness of breath, fever abdominal pain cyanosis. Initial encounter.  EXAM: PORTABLE CHEST - 1 VIEW  COMPARISON:  08/02/2013 and earlier.  FINDINGS: Portable AP semi upright view at 0612 hrs.  Extubated. Decreased bilateral veiling pulmonary opacity. Improved  retrocardiac ventilation. Stable cardiac size and mediastinal contours. No pneumothorax. Stable pulmonary vascularity/interstitial opacity.  IMPRESSION: 1. Extubated. 2. Decreased appearance of bilateral pleural effusions and improved bibasilar ventilation. 3. Continued  pulmonary vascular congestion/interstitial edema.   Electronically Signed   By: Lars Pinks M.D.   On: 08/06/2013 06:31   Dg Chest Port 1 View  08/02/2013   CLINICAL DATA:  Respiratory failure.  EXAM: PORTABLE CHEST - 1 VIEW  COMPARISON:  DG CHEST 1V PORT dated 08/01/2013  FINDINGS: Endotracheal tube is approximately 3 cm above the carina. Degree of pulmonary edema at may be mildly improved. There are likely bilateral pleural effusions. The heart remains moderately enlarged.  IMPRESSION: Mild decrease in pulmonary edema.   Electronically Signed  By: Aletta Edouard M.D.   On: 08/02/2013 07:40   Dg Chest Port 1 View  08/01/2013   CLINICAL DATA:  Status post re-intubation  EXAM: PORTABLE CHEST - 1 VIEW  COMPARISON:  05/22/2013  FINDINGS: An endotracheal tube is seen 3.5 cm above the carinal. The cardiac shadow is prominent. Vascular congestion with mild posteriorly layering effusions is identified. A temporary dialysis catheter is not seen on this film. Correlation with the clinical findings is recommended.  IMPRESSION: Vascular congestion and effusions which may be related to volume overload.  Endotracheal tube as described.   Electronically Signed   By: Inez Catalina M.D.   On: 08/01/2013 16:22    08/29/13 12-lead ECG:  SR with poor R-wave progression, otherwise no ST-T changes.  QTc prolonged at 502 msec.  Compared to prior ECG's, QTc is now prolonged.  ASSESSMENT:  68 yo M with PMHx of chronic IHD/ICMO/HFrEF=40% s/p NSTEMI in Feb. 2015 with findings of CTO RCA, 70-80% dLAD stenosis, 60% pLCX, 80% small OM2, 90% small mid-Ramus, L-R collaterals.  No intervention was performed due to patient's critical illness at the time and chronic  appearance of RCA occlusion.  Recommendation was to consider PCI of the distal LAD lesion in the future if patient has true recurrent angina and ischemia.  Additional PMHx of carotid disease s/p CEA, ESRD on HD, recent admissions in Feb 2015 and Aug 2014 with critical illness.  Currently he is being evaluated by [place] for positive downtrending troponin in the absence of chest pain or anginal equivalence and no ischemic ECG changes.  PLAN:  1-No evidence of true ACS; troponin elevation has been noted before in his past labs and is likely due to his ESRD.  Now that troponin has down trended, we will discontinue further checks.  Suggest ECG PRN if chest pain or concern for angina/arrhythmia. 2-CHF combined decompensation:  Agree with fluid removal via HD.  Patient is not on ACE-inhibitor/ARB therapy but if this is agreeable with his HD then we defer to medicine/nephrology if this can be initiated at a low dose (if BP's permit). 3-Continue aspirin and beta-blocker. 4-If patient has true ischemia/ACS in the future, then (as stated in his cardiac cath report from Feb. 2015) he may be a candidate for PCI of his distal LAD lesion.  At the present time, this does not seem to be warranted. 5-Please arrange for patient to have outpatient follow-up in Cardiology clinic with Dr. Irish Lack. 6-Appreciate primary team's management of his chronic issues including his ESRD.  Please call with questions or concerns.  Thank you for allowing [place] to participate in the care of this pleasant patient.  Signed, Charlton Haws, MD [facility]  08/29/2013, 9:08 PM

## 2013-08-29 NOTE — ED Notes (Signed)
Pt to XR

## 2013-08-29 NOTE — ED Provider Notes (Signed)
CSN: [id number]     Arrival date & time 08/29/13  1239 History   First MD Initiated Contact with Patient 08/29/13 1251     Chief Complaint  Patient presents with  . Fatigue    after dialysis      (Consider location/radiation/quality/duration/timing/severity/associated sxs/prior Treatment) The history is provided by the patient and the spouse.   Draxton Luu Tulloch is a 68 y.o. male who complains of weakness, and she'll, since yesterday, at dialysis. He also complains of difficulty eating for several weeks, because he does not have a good appetite. When he stands up to walk, he feels a little dizzy. He is due to be transitioned from hemodialysis, to peritoneal dialysis, later this month. He is nervous about making a change. His wife thinks that his dry weight of 160 is to low. He denies fever, cough, shortness of breath, back, pain, or abdominal pain. There are no other modifying factors.   Past Medical History  Diagnosis Date  . Chest pain     non-ischemic stress test 02/2011 (Dr. Radford Pax)  . Carotid artery occlusion     s/p CEA  . Neuropathy   . Arthritis   . H/O vitamin D deficiency   . Hearing loss   . Hypercholesteremia   . 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 S/P radiation therapy 01/24/2013-03/14/2013    Larynx/glottis / 63 Gy in 28 fractions  . Hypertension   . GERD (gastroesophageal reflux disease)   . Complication of anesthesia     hard to wake up and then panics waking up  . Sleep apnea     to request from Dr.Fried was done about 73yrs ago but no cpap d/t weight loss  . Fibromyalgia     takes tramadol daily as needed  . Myalgia and myositis   . Peripheral neuropathy   . History of gout   . History of colon polyps   . ESRD on dialysis     - Stage 5- follwed by Dr Sharlotte Alamo in [place]  . Diabetes mellitus   . Cataracts, bilateral   . COPD (chronic obstructive pulmonary disease)    Past Surgical History   Procedure Laterality Date  . Cholecystectomy  1993  . Rotator cuff repair  07/06/2011    right  . Carotid endarterectomy Left   . Coolonoscopy    . Colonoscopy w/ biopsies and polypectomy      Hx: of  . Microlaryngoscopy with laser N/A 01/04/2013    Procedure: MICROLARYNGOSCOPY WITH BIOPSY/LASER;  Surgeon: [name], MD;  Location: [facility];  Service: ENT;  Laterality: N/A;  . Av fistula placement Left 02/23/2013    Procedure: ARTERIOVENOUS (AV) FISTULA CREATION;  Surgeon: Conrad Buffalo, MD;  Location: [facility];  Service: Vascular;  Laterality: Left;  . Capd insertion N/A 08/01/2013    Procedure: LAPAROSCOPIC INSERTION CONTINUOUS AMBULATORY PERITONEAL DIALYSIS CATHETER;  Surgeon: [name], MD;  Location: [facility];  Service: General;  Laterality: N/A;  . Laparoscopic abdominal exploration N/A 08/01/2013    Procedure: LAPAROSCOPIC ABDOMINAL EXPLORATION;  Surgeon: [name], MD;  Location: [facility];  Service: General;  Laterality: N/A;   Family History  Problem Relation Age of Onset  . Adopted: Yes   History  Substance Use Topics  . Smoking status: Former Smoker -- 2.00 packs/day for 31 years    Types: Cigarettes    Quit date: 08/07/1995  . Smokeless tobacco:  Never Used     Comment: quit smoking in 1997  . Alcohol Use: No    Review of Systems  All other systems reviewed and are negative.      Allergies  Amoxicillin; Fentanyl; and Nsaids  Home Medications   Current Outpatient Rx  Name  Route  Sig  Dispense  Refill  . aspirin 81 MG tablet   Oral   Take 81 mg by mouth daily.          . calcium acetate (PHOSLO) 667 MG capsule   Oral   Take 1,334 mg by mouth 3 (three) times daily with meals.         Marland Kitchen dextrose 5 % SOLN 50 mL with cefTAZidime 2 G SOLR 2 g   Intravenous   Inject 2 g into the vein every Monday, Wednesday, and Friday with hemodialysis. Last dose on HD on 08/21/13   1 vial   3   . diphenhydrAMINE (BENADRYL) 25 MG tablet   Oral   Take 50 mg by mouth  every 6 (six) hours as needed for itching.          . esomeprazole (NEXIUM) 40 MG capsule   Oral   Take 40 mg by mouth daily before breakfast.         . glipiZIDE (GLUCOTROL XL) 2.5 MG 24 hr tablet   Oral   Take 2.5 mg by mouth daily as needed (diabetes).         . metoprolol tartrate (LOPRESSOR) 25 MG tablet   Oral   Take 0.5 tablets (12.5 mg total) by mouth 2 (two) times daily.         . multivitamin (RENA-VIT) TABS tablet   Oral   Take 1 tablet by mouth daily.          . Nutritional Supplements (FEEDING SUPPLEMENT, NEPRO CARB STEADY,) LIQD   Oral   Take 237 mLs by mouth 2 (two) times daily between meals.      0   . traMADol (ULTRAM) 50 MG tablet   Oral   Take 1-2 tablets (50-100 mg total) by mouth every 6 (six) hours as needed for moderate pain or severe pain.   40 tablet   0   . triamcinolone cream (KENALOG) 0.1 %   Topical   Apply 1 application topically 2 (two) times daily. Apply to knee and lower leg.         . Vancomycin (VANCOCIN) 750 MG/150ML SOLN   Intravenous   Inject 150 mLs (750 mg total) into the vein every Monday, Wednesday, and Friday with hemodialysis. Last dose on HD on 08/21/13   150 mL   3    BP 124/62  Pulse 88  Temp(Src) 98.1 F (36.7 C) (Oral)  Resp 23  Ht 5\' 10"  (1.778 m)  Wt 152 lb (68.947 kg)  BMI 21.81 kg/m2  SpO2 94% Physical Exam  Nursing note and vitals reviewed. Constitutional: He is oriented to person, place, and time. He appears well-developed and well-nourished.  HENT:  Head: Normocephalic and atraumatic.  Right Ear: External ear normal.  Left Ear: External ear normal.  Eyes: Conjunctivae and EOM are normal. Pupils are equal, round, and reactive to light.  Neck: Normal range of motion and phonation normal. Neck supple.  Cardiovascular: Normal rate, regular rhythm, normal heart sounds and intact distal pulses.   Pulmonary/Chest: Effort normal and breath sounds normal. No respiratory distress. He has no wheezes.  He exhibits no bony tenderness.  Abdominal: Soft.  Normal appearance. There is no tenderness.  Musculoskeletal: Normal range of motion.  Neurological: He is alert and oriented to person, place, and time. No cranial nerve deficit or sensory deficit. He exhibits normal muscle tone. Coordination normal.  Skin: Skin is warm, dry and intact.  Psychiatric: He has a normal mood and affect. His behavior is normal. Judgment and thought content normal.    ED Course  Procedures (including critical care time)  Medications - No data to display  Patient Vitals for the past 24 hrs:  BP Temp Temp src Pulse Resp SpO2 Height Weight  08/29/13 1515 - - - - - 94 % - -  08/29/13 1500 124/62 mmHg - - 88 23 - - -  08/29/13 1443 124/66 mmHg - - 92 24 94 % - -  08/29/13 1330 114/69 mmHg - - 92 22 98 % - -  08/29/13 1248 105/65 mmHg 98.1 F (36.7 C) Oral 91 22 - 5\' 10"  (1.778 m) 152 lb (68.947 kg)  08/29/13 1243 - - - - - 98 % - -    1:17 PM Reevaluation with update and discussion. After initial assessment and treatment, an updated evaluation reveals he remains, unchanged. Pulse ox phonation is 98% on 2 L nasal cannula oxygen. Leeam Cedrone L   Filed Weights   08/29/13 1248  Weight: 152 lb (68.947 kg)   15:10- on room air, the oxygen saturation is 84-86%. Is back on oxygen with improvement of the saturation to 94%  2:49 PM-Consult complete with SCHERTZ. Patient case explained and discussed. He agrees to see patient for further evaluation and treatment. He recommends Medicine admit. Call ended at 1515  3:16 PM-Consult complete with TAT. Patient case explained and discussed. He agrees to admit patient for further evaluation and treatment. Call ended at [place] Reviewed  CBC WITH DIFFERENTIAL - Abnormal; Notable for the following:    RBC 3.35 (*)    Hemoglobin 10.5 (*)    HCT 32.0 (*)    RDW 18.8 (*)    Lymphocytes Relative 11 (*)    All other components within normal limits  BASIC  METABOLIC PANEL - Abnormal; Notable for the following:    Sodium 132 (*)    Potassium 5.6 (*)    Chloride 86 (*)    Glucose, Bld 102 (*)    BUN 36 (*)    Creatinine, Ser 4.39 (*)    GFR calc non Af Amer 13 (*)    GFR calc Af Amer 15 (*)    All other components within normal limits   Imaging Review Dg Chest 2 View  08/29/2013   CLINICAL DATA Chest pain and dyspnea and weakness, status post dialysis; history of vocal cord malignancy with radiation therapy, history of COPD  EXAM CHEST  2 VIEW  COMPARISON DG CHEST 2 VIEW dated 08/15/2013  FINDINGS The lungs are adequately inflated. The interstitial markings are diffusely increased and areas of confluent alveolar density are present. There small bilateral pleural effusions. The cardiac silhouette is mildly enlarged. The pulmonary vascularity is indistinct. There is no pneumothorax. The observed portions of the bony thorax exhibit no acute abnormalities.  IMPRESSION The findings are consistent with congestive heart failure and pulmonary interstitial and alveolar edema and small bilateral pleural effusions. The appearance of the pulmonary interstitium has not dramatically changed since the previous study.  SIGNATURE  Electronically Signed   By: David  Martinique   On: 08/29/2013 14:25     EKG Interpretation   Date/Time:  Tuesday August 29 2013 12:53:36 EDT Ventricular Rate:  93 PR Interval:  153 QRS Duration: 111 QT Interval:  428 QTC Calculation: 532 R Axis:   93 Text Interpretation:  Sinus rhythm Borderline right axis deviation  Borderline repol abnormality, lateral leads Prolonged QT interval Since  last tracing QT has lengthened Confirmed by Eulis Foster  MD, Roxene Alviar [id number]) on  08/29/2013 1:39:48 PM      MDM    Final diagnoses:  Chest pain  Weakness  Prolonged Q-T interval on ECG  Hypoxia    Nonspecific specific weakness, with fluid status, indicating less than dry weight. His chest x-ray indicates fluid overload. Potassium was slightly  elevated, but there is hemolysis present.   Nursing Notes Reviewed/ Care Coordinated, and agree without changes. Applicable Imaging Reviewed.  Interpretation of Laboratory Data incorporated into ED treatment  Plan: Admit  Richarda Blade, MD 08/29/13 1623

## 2013-08-29 NOTE — Progress Notes (Signed)
Pt troponin level is 2.23 Dr. Elmarie Shiley nephrology notified, pt Dr. Baltazar Najjar notified as well

## 2013-08-29 NOTE — ED Notes (Signed)
Pt. returned from XR. 

## 2013-08-29 NOTE — Progress Notes (Addendum)
Admission note: [facility] verified with patient name and birth date  Arrival Method: stretcher from ED Mental Orientation: A&Ox4 Telemetry: Placed on telemetry box 6E06.  CCMD notified.   IV: 20G in the right AC.  Saline locked Pain: denies pain Tubes: n/a Safety Measures: Patient placed on bed alarm.  Family and patient educated on fall precautions.   6700 Orientation: Patient has been oriented to the unit, staff and to the room.   Patient transferred to hemodialysis.  Assessment and Admission screening has not yet been completed.

## 2013-08-29 NOTE — Progress Notes (Addendum)
Shift event: RN in HD called this NP earlier after troponin resulted at 2.23. Pt NOT having active chest pain at present in HD. 12 lead reveals no acute changes. Pt here for fluid overload and is having HD now. Has hx of NSTEMI and underwent stress testing and cardiac cath during Feb 2015 hospitalization here at [facility]. Cardio elected conservative medical therapy at that time and elected NOT to place pt on anticoagulation other than daily baby ASA. This NP paged Dr. Donneta Romberg, cardio fellow on call, and explained situation. He will see pt later this shift in consult. Will continue to trend Troponins. Clance Boll, NP [facility] Update: troponin trending downward. Cardio called but haven't seen yet. No active chest pain. Some indigestion.

## 2013-08-29 NOTE — ED Notes (Signed)
Pt arrived by [facility] from home and they report that pt has increased weakness and decreased appetite since yesterday after dialysis. Pt verbalizes he usually feels weak after dialysis but this is worse than normal. Pt verbalizes he is a M,W, F dialysis pt and has an AV Fistula in his L arm and also has new peritoneal access since Feb, 2015 but the site has not been used yet

## 2013-08-29 NOTE — H&P (Signed)
[facility] History and Physical  Victor Lane GGY:[id number] DOB: [birth year] DOA: 08/29/2013   PCP: [name], MD   Chief Complaint: Generalized weakness, poor by mouth intake  HPI:  68 year old male with a history of ESRD, PAF, systolic and diastolic CHF who was recently discharged from the hospital after a nine-day stay on 08/15/2013. He was admitted with sepsis during that period of time secondary to peritonitis related to his peritoneal dialysis catheter. The patient finished a two-week course of intravenous vancomycin and ceftazidime on 08/21/2013. His peritoneal dialysis catheter remains in place.  The patient is a poor historian. He is very tangential. Much of this history is obtained from review of the chart and speaking with the patient's wife at the bedside. According to his wife, the patient has had increasing generalized weakness, decreased by mouth intake, anorexia to the point of having difficulty getting out of bed. As a result, EMS was activated today. The patient complains of subjective chills. He has intermittent left sided chest discomfort with a nonproductive cough. He denies any hemoptysis or sputum production. The patient also complains of dyspnea on exertion which he feels is slightly worse than when he was discharged from the hospital 2 weeks ago. He denies any orthopnea or PND. He has not had any worsening peripheral edema. He denies any hematochezia, melena, hemoptysis, epistaxis. He denies any headache, dizziness, syncope, falls. There has not been any new medications. The patient has been compliant with dialysis on Monday, Wednesday, Fridays. His previous dry weight at the time of discharge was 160 pounds. His wife states that his dry weight has been decreased even further. He maintains 2.5 L oxygen at nighttime for his COPD. During his last hospitalization, the patient underwent a cardiac catheterization on March 20 13,015 which revealed severe multi-vessel CAD  including mid RCA occlusion, distal LAD, and moderate disease in the circumflex. Cardiology recommended medical therapy. The patient was initiated on hemodialysis on his last admission from the hospital. He denies any dietary indiscretions.  In the emergency department, the patient was found to have hemoglobin 10.5, sodium 132, serum creatinine 4.39. He was hemodynamically stable. Oxygen saturation was 98% on 2.5 L. Assessment/Plan:  Generalized weakness -This is likely multifactorial including the patient's worsening anemia, fluid overload, and deconditioning -Check TSH -PT evaluation -Cycle troponins chronic respiratory failure  -continue home oxygen at 2.5L -secondary to Bilateral pleural effusions/fluid overload  -weight 81.8kg on 2/115/15-->70.9kg on 08/12/13  -chest x-ray revealed persistent interstitial edema with small b/l pleural effusions  -COPD on nocturnal O2 w/out exacerbation  -Oxygenation improving with fluid removal  -Albuterol / Ipratropium prn Anemia -FOBT -baseline Hgb 12-13 -haptoglobin, LDH  Fluid overload -EF 40-45% on 08/07/13 echo  -LexiScan 08/13/13--markedly abnormal showingeversible myocardial perfusion defects at the inferior/inferolateral and anteroseptal walls of the left ventricle. Mildly decreased left ventricular ejection fraction of 38%. No focal wall motion abnormalities.  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  -Cardiology recommended medical therapy  -oxygenating better with fluid removal  -renal having to drive down dry weight Paroxysmal atrial flutter  -Developed on 08/09/2013  -remains in sinus rhythm -Continue metoprolol tartrate--remained in sinus rhythm  -CHADSVASc= 4  -Cardiology recommended ASA 81mg  last admission -continue metoprolol tartrate --LexiScan 08/13/13--markedly abnormal showingeversible myocardial perfusion defects at the  inferior/inferolateral and anteroseptal walls of the left ventricle. Mildly decreased left ventricular ejection fraction of 38%. No focal wall motion abnormalities.  ESRD on HD -  MWF  -Try to maintain CAPD catheter as long as the patient clinically improves  -HD per renal  Peritonitis  -S/p PD cath placement 2/10: Flushing well, healing well externally (? Extension tubing pulled off at home)  -Clinically improved -Weekly PD catheter maintenance  Thrombocytopenia -HIT panel--negative last admission -overall improved from last admission DM  -intermitten Hypoglycemia due to glipizide last admission -SSI  -Hold Glipizide during the hospitalization, although this will be resumed at the time of discharge  -hemoglobin A1c--6.3  Renal Mass  -Left renal 1.1 cm hyperdensity likely represents hemorrhagic cyst  -Needs a 6 month surveillance CT          Past Medical History  Diagnosis Date  . Chest pain     non-ischemic stress test 02/2011 (Dr. Radford Pax)  . Carotid artery occlusion     s/p CEA  . Neuropathy   . Arthritis   . H/O vitamin D deficiency   . Hearing loss   . Hypercholesteremia   . 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 S/P radiation therapy 01/24/2013-03/14/2013    Larynx/glottis / 63 Gy in 28 fractions  . Hypertension   . GERD (gastroesophageal reflux disease)   . Complication of anesthesia     hard to wake up and then panics waking up  . Sleep apnea     to request from Dr.Fried was done about 10yrs ago but no cpap d/t weight loss  . Fibromyalgia     takes tramadol daily as needed  . Myalgia and myositis   . Peripheral neuropathy   . History of gout   . History of colon polyps   . ESRD on dialysis     - Stage 5- follwed by Dr Sharlotte Alamo in [place]  . Diabetes mellitus   . Cataracts, bilateral   . COPD (chronic obstructive pulmonary disease)    Past Surgical History  Procedure Laterality Date  .  Cholecystectomy  1993  . Rotator cuff repair  07/06/2011    right  . Carotid endarterectomy Left   . Coolonoscopy    . Colonoscopy w/ biopsies and polypectomy      Hx: of  . Microlaryngoscopy with laser N/A 01/04/2013    Procedure: MICROLARYNGOSCOPY WITH BIOPSY/LASER;  Surgeon: [name], MD;  Location: [facility];  Service: ENT;  Laterality: N/A;  . Av fistula placement Left 02/23/2013    Procedure: ARTERIOVENOUS (AV) FISTULA CREATION;  Surgeon: Conrad [place], MD;  Location: [facility];  Service: Vascular;  Laterality: Left;  . Capd insertion N/A 08/01/2013    Procedure: LAPAROSCOPIC INSERTION CONTINUOUS AMBULATORY PERITONEAL DIALYSIS CATHETER;  Surgeon: [name], MD;  Location: [facility];  Service: General;  Laterality: N/A;  . Laparoscopic abdominal exploration N/A 08/01/2013    Procedure: LAPAROSCOPIC ABDOMINAL EXPLORATION;  Surgeon: [name], MD;  Location: [facility];  Service: General;  Laterality: N/A;   Social History:  reports that he quit smoking about 18 years ago. His smoking use included Cigarettes. He has a 62 pack-year smoking history. He has never used smokeless tobacco. He reports that he does not drink alcohol or use illicit drugs.   Family History  Problem Relation Age of Onset  . Adopted: Yes     Allergies  Allergen Reactions  . Amoxicillin     Causes upset stomach  . Fentanyl     Confusion when patch applied  . Nsaids Other (See Comments)  Severe kidney disease/ESRD      Prior to Admission medications   Medication Sig Start Date End Date Taking? Authorizing Provider  aspirin 81 MG tablet Take 81 mg by mouth daily.     Historical Provider, MD  calcium acetate (PHOSLO) 667 MG capsule Take 1,334 mg by mouth 3 (three) times daily with meals.    Historical Provider, MD  dextrose 5 % SOLN 50 mL with cefTAZidime 2 G SOLR 2 g Inject 2 g into the vein every Monday, Wednesday, and Friday with hemodialysis. Last dose on HD on 08/21/13 08/15/13   Orson Eva, MD   diphenhydrAMINE (BENADRYL) 25 MG tablet Take 50 mg by mouth every 6 (six) hours as needed for itching.     Historical Provider, MD  esomeprazole (NEXIUM) 40 MG capsule Take 40 mg by mouth daily before breakfast.    Historical Provider, MD  glipiZIDE (GLUCOTROL XL) 2.5 MG 24 hr tablet Take 2.5 mg by mouth daily as needed (diabetes).    Historical Provider, MD  metoprolol tartrate (LOPRESSOR) 25 MG tablet Take 0.5 tablets (12.5 mg total) by mouth 2 (two) times daily. 02/25/13   Tammy S Parrett, NP  multivitamin (RENA-VIT) TABS tablet Take 1 tablet by mouth daily.     Historical Provider, MD  Nutritional Supplements (FEEDING SUPPLEMENT, NEPRO CARB STEADY,) LIQD Take 237 mLs by mouth 2 (two) times daily between meals. 05/24/13   Zerita Boers, MD  traMADol (ULTRAM) 50 MG tablet Take 1-2 tablets (50-100 mg total) by mouth every 6 (six) hours as needed for moderate pain or severe pain. 08/01/13   [name], MD  triamcinolone cream (KENALOG) 0.1 % Apply 1 application topically 2 (two) times daily. Apply to knee and lower leg. 07/18/13   Historical Provider, MD  Vancomycin (VANCOCIN) 750 MG/150ML SOLN Inject 150 mLs (750 mg total) into the vein every Monday, Wednesday, and Friday with hemodialysis. Last dose on HD on 08/21/13 08/15/13   Orson Eva, MD    Review of Systems:  Constitutional:  No weight loss, night sweats.  Head&Eyes: No headache.  No vision loss.  No eye pain or scotoma ENT:  No Difficulty swallowing,Tooth/dental problems,Sore throat,  No ear ache, post nasal drip,  Cardio-vascular:  No Orthopnea, PND, swelling in lower extremities,  dizziness, palpitations  GI:  No  abdominal pain, nausea, vomiting, diarrhea,hematochezia, melena, heartburn, indigestion, Resp:  No coughing up of blood .No wheezing.No chest wall deformity  Skin:  no rash or lesions.  GU:  no dysuria, change in color of urine, no urgency or frequency. No flank pain.  Musculoskeletal:  No joint pain or  swelling. No decreased range of motion. No back pain.  Psych:  No change in mood or affect. No depression or anxiety. Neurologic: No headache, no dysesthesia, no focal weakness, no vision loss. No syncope  Physical Exam: Filed Vitals:   08/29/13 1500 08/29/13 1515 08/29/13 1600 08/29/13 1633  BP: 124/62  129/69   Pulse: 88  86 89  Temp:    98.1 F (36.7 C)  TempSrc:    Oral  Resp: 23  23 17   Height:      Weight:      SpO2:  94% 94% 95%   General:  A&O x 2, NAD, nontoxic, pleasant/cooperative Head/Eye: No conjunctival hemorrhage, no icterus, [place]/AT, No nystagmus ENT:  No icterus,  No thrush, good dentition, no pharyngeal exudate Neck:  No masses, no lymphadenpathy CV:  RRR, no rub, no gallop, no S3 Lung:  Bibasilar crackles. No wheezing. Good air movement.  Abdomen: soft/NT, +BS, nondistended, no peritoneal signs Ext: No cyanosis, No rashes, No petechiae, No lymphangitis, No edema Neuro: CNII-XII intact, strength 4/5 in bilateral upper and lower extremities, no dysmetria  Labs on Admission:  Basic Metabolic Panel:  Recent Labs Lab 08/29/13 1315  NA 132*  K 5.6*  CL 86*  CO2 25  GLUCOSE 102*  BUN 36*  CREATININE 4.39*  CALCIUM 9.2   Liver Function Tests: No results found for this basename: AST, ALT, ALKPHOS, BILITOT, PROT, ALBUMIN,  in the last 168 hours No results found for this basename: LIPASE, AMYLASE,  in the last 168 hours No results found for this basename: AMMONIA,  in the last 168 hours CBC:  Recent Labs Lab 08/29/13 1315  WBC 6.4  NEUTROABS 5.0  HGB 10.5*  HCT 32.0*  MCV 95.5  PLT 187   Cardiac Enzymes: No results found for this basename: CKTOTAL, CKMB, CKMBINDEX, TROPONINI,  in the last 168 hours BNP: No components found with this basename: POCBNP,  CBG: No results found for this basename: GLUCAP,  in the last 168 hours  Radiological Exams on Admission: Dg Chest 2 View  08/29/2013   CLINICAL DATA Chest pain and dyspnea and weakness, status  post dialysis; history of vocal cord malignancy with radiation therapy, history of COPD  EXAM CHEST  2 VIEW  COMPARISON DG CHEST 2 VIEW dated 08/15/2013  FINDINGS The lungs are adequately inflated. The interstitial markings are diffusely increased and areas of confluent alveolar density are present. There small bilateral pleural effusions. The cardiac silhouette is mildly enlarged. The pulmonary vascularity is indistinct. There is no pneumothorax. The observed portions of the bony thorax exhibit no acute abnormalities.  IMPRESSION The findings are consistent with congestive heart failure and pulmonary interstitial and alveolar edema and small bilateral pleural effusions. The appearance of the pulmonary interstitium has not dramatically changed since the previous study.  SIGNATURE  Electronically Signed   By: Shainna Faux  [place]   On: 08/29/2013 14:25    EKG: Independently reviewed. Sinus rhythm, nonspecific T wave changes. Prolonged QT.    Time spent:70 minutes Code Status:   FULL Family Communication:   Wife and son at bedside   Jhordan Mckibben, DO  [facility] Pager [phone]  If 7PM-7AM, please contact night-coverage [email or web address] Password [facility] 08/29/2013, 4:46 PM

## 2013-08-30 DIAGNOSIS — E119 Type 2 diabetes mellitus without complications: Secondary | ICD-10-CM

## 2013-08-30 DIAGNOSIS — I509 Heart failure, unspecified: Secondary | ICD-10-CM

## 2013-08-30 DIAGNOSIS — D638 Anemia in other chronic diseases classified elsewhere: Secondary | ICD-10-CM

## 2013-08-30 DIAGNOSIS — N186 End stage renal disease: Secondary | ICD-10-CM

## 2013-08-30 LAB — CBC
HEMATOCRIT: 35.4 % — AB (ref 39.0–52.0)
HEMOGLOBIN: 11.7 g/dL — AB (ref 13.0–17.0)
MCH: 31 pg (ref 26.0–34.0)
MCHC: 33.1 g/dL (ref 30.0–36.0)
MCV: 93.7 fL (ref 78.0–100.0)
Platelets: 206 10*3/uL (ref 150–400)
RBC: 3.78 MIL/uL — ABNORMAL LOW (ref 4.22–5.81)
RDW: 18.7 % — AB (ref 11.5–15.5)
WBC: 6.8 10*3/uL (ref 4.0–10.5)

## 2013-08-30 LAB — BASIC METABOLIC PANEL
BUN: 54 mg/dL — AB (ref 6–23)
CHLORIDE: 86 meq/L — AB (ref 96–112)
CO2: 24 meq/L (ref 19–32)
CREATININE: 5.64 mg/dL — AB (ref 0.50–1.35)
Calcium: 9.5 mg/dL (ref 8.4–10.5)
GFR calc Af Amer: 11 mL/min — ABNORMAL LOW (ref >90)
GFR calc non Af Amer: 9 mL/min — ABNORMAL LOW (ref >90)
Glucose, Bld: 84 mg/dL (ref 70–99)
Potassium: 4.4 mEq/L (ref 3.7–5.3)
Sodium: 131 mEq/L — ABNORMAL LOW (ref 137–147)

## 2013-08-30 LAB — GLUCOSE, CAPILLARY
GLUCOSE-CAPILLARY: 135 mg/dL — AB (ref 70–99)
GLUCOSE-CAPILLARY: 79 mg/dL (ref 70–99)

## 2013-08-30 LAB — VITAMIN B12: VITAMIN B 12: 566 pg/mL (ref 211–911)

## 2013-08-30 LAB — TSH: TSH: 2.404 u[IU]/mL (ref 0.350–4.500)

## 2013-08-30 LAB — OCCULT BLOOD X 1 CARD TO LAB, STOOL: Fecal Occult Bld: NEGATIVE

## 2013-08-30 LAB — TROPONIN I: Troponin I: 1.33 ng/mL (ref <0.30)

## 2013-08-30 MED ORDER — CALCIUM CARBONATE ANTACID 500 MG PO CHEW
400.0000 mg | CHEWABLE_TABLET | Freq: Once | ORAL | Status: AC
  Administered 2013-08-30: 400 mg via ORAL
  Filled 2013-08-30: qty 2

## 2013-08-30 MED ORDER — MIDODRINE HCL 10 MG PO TABS: 10.0000 mg | ORAL_TABLET | ORAL | Status: DC

## 2013-08-30 MED ORDER — DARBEPOETIN ALFA-POLYSORBATE 100 MCG/0.5ML IJ SOLN
INTRAMUSCULAR | Status: AC
  Filled 2013-08-30: qty 0.5

## 2013-08-30 MED ORDER — DOXERCALCIFEROL 4 MCG/2ML IV SOLN
INTRAVENOUS | Status: AC
  Filled 2013-08-30: qty 2

## 2013-08-30 MED ORDER — MIDODRINE HCL 5 MG PO TABS
ORAL_TABLET | ORAL | Status: AC
  Filled 2013-08-30: qty 2

## 2013-08-30 MED ORDER — BIOTENE DRY MOUTH MT LIQD
15.0000 mL | Freq: Two times a day (BID) | OROMUCOSAL | Status: DC
  Administered 2013-08-30: 15 mL via OROMUCOSAL

## 2013-08-30 NOTE — Progress Notes (Signed)
Subjective:  On hd feels lightly stronger/ tolerated uf last night Objective Vital signs in last 24 hours: Filed Vitals:   08/29/13 2302 08/29/13 2306 08/30/13 0037 08/30/13 0602  BP:  115/68 119/53 110/38  Pulse:  88 87 80  Temp:  98.1 F (36.7 C)  98.1 F (36.7 C)  TempSrc:  Oral  Oral  Resp:  20  18  Height: 5\' 10"  (1.778 m)     Weight:  67.7 kg (149 lb 4 oz)    SpO2:  94% 100% 97%   Labs: Basic Metabolic Panel:  Recent Labs Lab 08/29/13 1315  NA 132*  K 5.6*  CL 86*  CO2 25  GLUCOSE 102*  BUN 36*  CREATININE 4.39*  CALCIUM 9.2   Liver Function Tests:  Recent Labs Lab 08/29/13 1730  AST 36  ALT 15  ALKPHOS 105  BILITOT 0.8  PROT 6.6  ALBUMIN 2.6*    Recent Labs Lab 08/29/13 1730  AMMONIA 25   CBC:  Recent Labs Lab 08/29/13 1315  WBC 6.4  NEUTROABS 5.0  HGB 10.5*  HCT 32.0*  MCV 95.5  PLT 187   Cardiac Enzymes:  Recent Labs Lab 08/29/13 1712 08/30/13  TROPONINI 2.23* 1.33*   CBG:  Recent Labs Lab 08/29/13 1710 08/29/13 2301 08/30/13 0043  GLUCAP 70 87 135*    Studies/Results: Dg Chest 2 View  08/29/2013   CLINICAL DATA Chest pain and dyspnea and weakness, status post dialysis; history of vocal cord malignancy with radiation therapy, history of COPD  EXAM CHEST  2 VIEW  COMPARISON DG CHEST 2 VIEW dated 08/15/2013  FINDINGS The lungs are adequately inflated. The interstitial markings are diffusely increased and areas of confluent alveolar density are present. There small bilateral pleural effusions. The cardiac silhouette is mildly enlarged. The pulmonary vascularity is indistinct. There is no pneumothorax. The observed portions of the bony thorax exhibit no acute abnormalities.  IMPRESSION The findings are consistent with congestive heart failure and pulmonary interstitial and alveolar edema and small bilateral pleural effusions. The appearance of the pulmonary interstitium has not dramatically changed since the previous study.   SIGNATURE  Electronically Signed   By: David  [place]   On: 08/29/2013 14:25   Medications:   . antiseptic oral rinse  15 mL Mouth Rinse BID  . aspirin EC  81 mg Oral Daily  . calcium acetate  1,334 mg Oral TID WC  . darbepoetin (ARANESP) injection - DIALYSIS  100 mcg Intravenous Q Wed-HD  . doxercalciferol  3 mcg Intravenous Q M,W,F-HD  . feeding supplement (NEPRO CARB STEADY)  237 mL Oral BID BM  . heparin  5,000 Units Subcutaneous 3 times per day  . insulin aspart  0-9 Units Subcutaneous TID WC  . metoprolol tartrate  12.5 mg Oral BID  . midodrine  10 mg Oral Q M,W,F-HD  . multivitamin  1 tablet Oral QHS  . pantoprazole  80 mg Oral Q1200  . sodium chloride  3 mL Intravenous Q12H    Physical Exam: Alert NAD on hd RRR, 1/6 sem, no rub Chest CTA bilat dec'd BS L base BS pos., soft nontender, nondistended/ PD cath in place No pedal edema/ L U A AVF patent on hd   ECHO 08/07/13  LV 40-45%, RV dilated w nl function, mod AI, diast 2  Dialysis: MWF [facility] 4h   70 kg     2/2.25 [place]    Heparin 2400   LUA AVF   400/800  Hectorol 3  Epogen 6000  Venofer none  Assessment/Plan  1. Acute/Chronic CHF with Pulm edema = 3 liter UF  Last pm  With 67.7 kg bp stable with MIDODRINE 10 mg helping with bp support/ Below edw 70kg/ now on hd with1.5 liter goal/ POS. CE noted with esrd and chf/  Check cxr after hd for vol issue 2. ESRD - Nl schedule MWF/ PD flushing on Thursday/for 09/11/13 pd training 3. Hypertension/volume - Midodrine pre hd to help bp support/ lower edw as noted above/ stable bp on hd this am so far. 4. Anemia - esa on hd / 10.5 hgb 5. Metabolic bone disease - Vit d and binders  6. CAD/ AR ( POSITIVE TROPONIN'S) = card saw last night and  med tx currently,/  "may be a candidate for PCI distal LAD lesion,at present time not warranted" 7. DM =per admit" 8. Weakness sec to  multiple  Etiologies=1./6.= ? Needs some PT( "was walking around past weekend and driving my car")  Ernest Haber, PA-C [place] [phone] 08/30/2013,8:21 AM  LOS: 1 day   Pt seen, examined, agree w assess/plan as above with additions as indicated.  Kelly Splinter MD pager [phone]    cell [phone] 08/30/2013, 11:15 AM

## 2013-08-30 NOTE — Evaluation (Signed)
Physical Therapy Evaluation and Discharge Patient Details Name: Victor Lane MRN: [id number] DOB: 1945/11/09 Today's Date: 08/30/2013 Time: 5009-3818 PT Time Calculation (min): 23 min  PT Assessment / Plan / Recommendation History of Present Illness  68 year old male with a history of ESRD, PAF, systolic and diastolic CHF who was recently discharged from the hospital after a nine-day stay on 08/15/2013. He was admitted with sepsis during that period of time secondary to peritonitis related to his peritoneal dialysis catheter.   Clinical Impression  Pt adm with the above dx. Pt demonstrated safe functional mobility with RW for transfers and amb. Pt req frequent standing rest breaks during amb to catch is breath, did not experience LOB. Pt lives at home with his wife who is available for home assistance. Pt was participating in [place] prior to adm and reports that he is eager to return to [place]. Pt to benefit from cardiopulmonary PT per MD request. Pt is safe to return home with the assistance of his wife. No further skilled acute PT needed at this time. PT signing off. Please re-consult if services needed in future.    PT Assessment  Patient needs continued PT services    Follow Up Recommendations  Outpatient PT (to address generalized weakness;to benefit cardiopulm PT)    Does the patient have the potential to tolerate intense rehabilitation      Barriers to Discharge   none    Equipment Recommendations       Recommendations for Other Services     Frequency Min 1X/week    Precautions / Restrictions Precautions Precautions: Fall Restrictions Weight Bearing Restrictions: No   Pertinent Vitals/Pain Vitals were within function limits      Mobility  Bed Mobility Overal bed mobility: Needs Assistance Bed Mobility: Supine to Sit Supine to sit: Supervision General bed mobility comments: v/c to instruct move feet over to sit EOB Transfers Overall transfer level: Needs  assistance Equipment used: Rolling walker (2 wheeled) Transfers: Sit to/from Stand Sit to Stand: Min guard General transfer comment: pt able to stand without assist but became a little uneasy once standing. min guard for safety Ambulation/Gait Ambulation/Gait assistance: Min guard Ambulation Distance (Feet): 125 Feet Assistive device: Rolling walker (2 wheeled) Gait Pattern/deviations: Step-through pattern;Decreased stride length Gait velocity: slow and guarded for falls General Gait Details: pt req frequent standing breaks to catch his breath. LEs became fatigued during amb; no LOB    Exercises     PT Diagnosis: Difficulty walking;Generalized weakness  PT Problem List: Decreased strength;Decreased activity tolerance;Decreased balance;Decreased mobility;Decreased knowledge of use of DME;Cardiopulmonary status limiting activity PT Treatment Interventions: DME instruction;Gait training;Functional mobility training;Therapeutic activities;Therapeutic exercise;Patient/family education     PT Goals(Current goals can be found in the care plan section) Acute Rehab PT Goals Patient Stated Goal: return home and to continue with OPPT PT Goal Formulation: With patient Time For Goal Achievement: 09/06/13 Potential to Achieve Goals: Good  Visit Information  Last PT Received On: 08/30/13 Assistance Needed: +1 History of Present Illness: 68 year old male with a history of ESRD, PAF, systolic and diastolic CHF who was recently discharged from the hospital after a nine-day stay on 08/15/2013. He was admitted with sepsis during that period of time secondary to peritonitis related to his peritoneal dialysis catheter.        Prior [place] expects to be discharged to:: Private residence Living Arrangements: Spouse/significant other Available Help at Discharge: Family Type of Home: House Home Access: Stairs to enter [employer] of  Steps: 1 Entrance  Stairs-Rails: None Home Layout: One level Home Equipment: Walker - 2 wheels;Grab bars - tub/shower;Bedside commode Prior Function Level of Independence: Needs assistance Gait / Transfers Assistance Needed: RW and some assist from wife Comments: tripped over dog and fell about 3 weeks ago Communication Communication: No difficulties    Cognition  Cognition Arousal/Alertness: Awake/alert (exhausted from dialysis ) Behavior During Therapy: WFL for tasks assessed/performed Overall Cognitive Status: Within Functional Limits for tasks assessed    Extremity/Trunk Assessment Upper Extremity Assessment Upper Extremity Assessment: Generalized weakness Lower Extremity Assessment Lower Extremity Assessment: Generalized weakness   Balance Balance Overall balance assessment: Needs assistance Sitting-balance support: No upper extremity supported Sitting balance-Leahy Scale: Good Standing balance support: Bilateral upper extremity supported Standing balance-Leahy Scale: Fair Standing balance comment: req RW to support in standing and for resting break support General Comments General comments (skin integrity, edema, etc.): pt expressed being fatigued and "worn out" by staying at the hospital.   End of Session PT - End of Session Equipment Utilized During Treatment: Gait belt;Oxygen (RW) Activity Tolerance: Patient limited by fatigue (limited by SOB with amb) Patient left: in bed;with call bell/phone within reach;with family/visitor present Nurse Communication: Mobility status  GP     Shaylin Blatt SPT 08/30/2013, 4:22 PM

## 2013-08-30 NOTE — Discharge Summary (Signed)
Physician Discharge Summary  Mikhi Mathy [name] ZM:[id number] DOB: Feb 26, 1946 DOA: 08/29/2013  PCP: [name], MD  Admit date: 08/29/2013 Discharge date: 08/30/2013  Time spent: 35 minutes  Recommendations for Outpatient Follow-up:  1. Follow up on blood pressures   Discharge Diagnoses:  Active Problems:   CHF (congestive heart failure)   Fluid overload   Chronic respiratory failure   ESRD on dialysis   Discharge Condition: Stable  Diet recommendation: Heart Healthy Diet  Filed Weights   08/29/13 2306 08/30/13 0746 08/30/13 1208  Weight: 67.7 kg (149 lb 4 oz) 67 kg (147 lb 11.3 oz) 66.7 kg (147 lb 0.8 oz)    History of present illness:  68 year old male with a history of ESRD, PAF, systolic and diastolic CHF who was recently discharged from the hospital after a nine-day stay on 08/15/2013. He was admitted with sepsis during that period of time secondary to peritonitis related to his peritoneal dialysis catheter. The patient finished a two-week course of intravenous vancomycin and ceftazidime on 08/21/2013. His peritoneal dialysis catheter remains in place.  The patient is a poor historian. He is very tangential. Much of this history is obtained from review of the chart and speaking with the patient's wife at the bedside. According to his wife, the patient has had increasing generalized weakness, decreased by mouth intake, anorexia to the point of having difficulty getting out of bed. As a result, EMS was activated today. The patient complains of subjective chills. He has intermittent left sided chest discomfort with a nonproductive cough. He denies any hemoptysis or sputum production. The patient also complains of dyspnea on exertion which he feels is slightly worse than when he was discharged from the hospital 2 weeks ago. He denies any orthopnea or PND. He has not had any worsening peripheral edema. He denies any hematochezia, melena, hemoptysis, epistaxis. He denies any headache,  dizziness, syncope, falls. There has not been any new medications. The patient has been compliant with dialysis on Monday, Wednesday, Fridays. His previous dry weight at the time of discharge was 160 pounds. His wife states that his dry weight has been decreased even further. He maintains 2.5 L oxygen at nighttime for his COPD. 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 Cardiology recommended medical therapy. The patient was initiated on hemodialysis on his last admission from the hospital. He denies any dietary indiscretions.   Hospital Course:  Patient is a pleasant 68 year old gentleman with a past medical history of end-stage renal disease admitted to the medicine service on 08/29/2013 as he presented with complaints of generalized weakness, malaise, having difficulties getting out of bed along with nonexertional dyspnea. Generalized weakness felt to be multifactorial with fluid overload and deconditioning contributing. He was admitted to telemetry with nephrology consultation. Patient underwent hemodialysis on 08/29/2013 and 08/30/2013. Cardiology was consulted given patient's cardiac history, with a troponin, back elevated on this admission and 2.23. He was seen and evaluated by Dr.Sharma, not felt to have evidence of true acute coronary syndrome. It was felt troponin elevation reflected to end-stage renal disease and decreased clearance. Aspirin and beta blocker therapy were continued. During this hospitalization he was started on Midodrine pre-hemodialysis to help support blood pressures. He was found to be deconditioned/weakness, likely from multiple factors including chronic illness and fluid overload.  Case manager was consulted to set him up with home health physical  therapy.  Procedures:  Hemodialysis  Consultations:  Nephrology  Cardiology  Discharge Exam: Filed Vitals:   08/30/13 1347  BP: 134/64  Pulse: 79  Temp: 97.4 F (36.3 C)  Resp: 16    General: Patient is in no acute distress, awake alert and oriented x3 Cardiovascular: Regular rate and rhythm normal S1 S2 Respiratory: Normal respiratory effort, minimal bibasilar crackles Abdomen: Soft nontender nondistended  Discharge Instructions  Discharge Orders   Future Appointments Provider Department Dept Phone   11/24/2013 3:00 PM Eppie Gibson, MD [facility] [phone]   Future Orders Complete By Expires   Call MD for:  difficulty breathing, headache or visual disturbances  As directed    Call MD for:  extreme fatigue  As directed    Call MD for:  persistant nausea and vomiting  As directed    Call MD for:  severe uncontrolled pain  As directed    Call MD for:  temperature >100.4  As directed    Diet - low sodium heart healthy  As directed    Increase activity slowly  As directed        Medication List    STOP taking these medications       dextrose 5 % SOLN 50 mL with cefTAZidime 2 G SOLR 2 g     Vancomycin 750 MG/150ML Soln  Commonly known as:  VANCOCIN      TAKE these medications       aspirin 81 MG tablet  Take 81 mg by mouth daily.     calcium acetate 667 MG capsule  Commonly known as:  PHOSLO  Take 1,334 mg by mouth 3 (three) times daily with meals.     diphenhydrAMINE 25 MG tablet  Commonly known as:  BENADRYL  Take 50 mg by mouth every 6 (six) hours as needed for itching.     esomeprazole 40 MG capsule  Commonly known as:  NEXIUM  Take 40 mg by mouth daily before breakfast.     feeding supplement (NEPRO CARB STEADY) Liqd  Take 237 mLs by mouth 2 (two) times daily between meals.     glipiZIDE 2.5 MG 24 hr tablet  Commonly known as:  GLUCOTROL XL  Take 2.5 mg by mouth daily as needed (diabetes).     metoprolol  tartrate 25 MG tablet  Commonly known as:  LOPRESSOR  Take 0.5 tablets (12.5 mg total) by mouth 2 (two) times daily.     midodrine 10 MG tablet  Commonly known as:  PROAMATINE  Take 1 tablet (10 mg total) by mouth every Monday, Wednesday, and Friday with hemodialysis.     multivitamin Tabs tablet  Take 1 tablet by mouth daily.     traMADol 50 MG tablet  Commonly known as:  ULTRAM  Take 1-2 tablets (50-100 mg total) by mouth every 6 (six) hours as needed for moderate pain or severe pain.     triamcinolone cream 0.1 %  Commonly known as:  KENALOG  Apply 1 application topically 2 (two) times daily. Apply to knee and lower leg.       Allergies  Allergen Reactions  . Amoxicillin     Causes upset stomach  . Fentanyl     Confusion when patch applied  . Nsaids Other (See Comments)    Severe kidney disease/ESRD       Follow-up Information   Follow up with FRIED, ROBERT L, MD In 1 week.   Specialty:  Family Medicine  Contact information:   [place] [address] [phone]        The results of significant diagnostics from this hospitalization (including imaging, microbiology, ancillary and laboratory) are listed below for reference.    Significant Diagnostic Studies: Ct Abdomen Pelvis Wo Contrast  08/06/2013   CLINICAL DATA:  Abdominal pain and fever. Recent surgical procedure for peritoneal dialysis 5 days ago.  EXAM: CT ABDOMEN AND PELVIS WITHOUT CONTRAST  TECHNIQUE: Multidetector CT imaging of the abdomen and pelvis was performed following the standard protocol without intravenous contrast.  COMPARISON:  None.  FINDINGS: Lung bases demonstrate a moderate size right pleural effusion and small left pleural effusion with associated airspace opacification which may be due to atelectasis or infection. There is mild cardiomegaly.  Abdominal images demonstrate evidence of a prior cholecystectomy. There is a small amount of perihepatic fluid. There is mild fatty  infiltration of the liver with subtle nodular contour. The pancreas and adrenal glands are unremarkable. Appendix is within normal. Kidneys are normal in size with bilateral renal cortical thinning. There is a 6 cm exophytic simple cyst over the mid pole of the left kidney. There are a few small of bilateral hypodensities likely cysts. There is a 1.1 cm homogeneously hyperdense mass over the lower pole of the left kidney with Hounsfield unit measurements 45 likely a hemorrhagic cyst. Ureters are within normal. There is no hydronephrosis or nephrolithiasis. There is diverticulosis of the colon most prominent over the sigmoid colon. There is a peritoneal dialysis catheter entering the left mid abdomen with tip coiled over the pelvis between loops of rectosigmoid colon. There is moderate calcified plaque of the thoracoabdominal aorta and iliac arteries. Minimal free fluid over the right pericolic gutter. No evidence of free peritoneal air.  Remaining pelvic structures are notable only for moderate fecal retention over the rectosigmoid colon. There is mild degenerative change of the spine and hips.  IMPRESSION: Moderate size right pleural effusion and small left pleural effusion with associated airspace opacification in the lower lobes which may be due to pneumonia or atelectasis.  Peritoneal dialysis catheter with tip coiled over the pelvis between several recto sigmoid colonic loops.  Changes involving the liver which may be due to cirrhosis. Small amount of ascites.  Bilateral renal cortical thinning and bilateral renal cysts. 1.1 cm homogeneously hyperdense left renal mass likely a hemorrhagic cyst. Recommend followup CT 6 months.  Diverticulosis of the colon. Moderate fecal retention over the rectosigmoid colon.  Small hiatal hernia.   Electronically Signed   By: Marin Olp M.D.   On: 08/06/2013 07:57   Dg Chest 2 View  08/29/2013   CLINICAL DATA Chest pain and dyspnea and weakness, status post dialysis;  history of vocal cord malignancy with radiation therapy, history of COPD  EXAM CHEST  2 VIEW  COMPARISON DG CHEST 2 VIEW dated 08/15/2013  FINDINGS The lungs are adequately inflated. The interstitial markings are diffusely increased and areas of confluent alveolar density are present. There small bilateral pleural effusions. The cardiac silhouette is mildly enlarged. The pulmonary vascularity is indistinct. There is no pneumothorax. The observed portions of the bony thorax exhibit no acute abnormalities.  IMPRESSION The findings are consistent with congestive heart failure and pulmonary interstitial and alveolar edema and small bilateral pleural effusions. The appearance of the pulmonary interstitium has not dramatically changed since the previous study.  SIGNATURE  Electronically Signed   By: David  Martinique   On: 08/29/2013 14:25   Dg Chest  2 View  08/15/2013   CLINICAL DATA:  End-stage renal disease.  Post dialysis.  Edema.  EXAM: CHEST  2 VIEW  COMPARISON:  08/12/2013.  FINDINGS: Lungs are hyperexpanded. There is bilateral irregular interstitial thickening similar to the prior exam consistent with interstitial edema. Small bilateral effusions are suggested. No pneumothorax.  Cardiac silhouette is normal in size and configuration. Normal mediastinal and hilar contours.  IMPRESSION: Persistent interstitial pulmonary edema. No change from the recent prior exam. Underlying COPD is suggested by lung hyperexpansion.   Electronically Signed   By: Lajean Manes M.D.   On: 08/15/2013 08:29   Dg Chest 2 View  08/11/2013   CLINICAL DATA:  Pulmonary edema.  EXAM: CHEST  2 VIEW  COMPARISON:  August 07, 2013.  FINDINGS: Stable cardiomegaly. Continued presence of interstitial densities are noted throughout both lungs most consistent with pulmonary edema or less likely pneumonia which is not significantly changed. Mild bilateral pleural effusions are noted. No pneumothorax is noted. Bony thorax is intact.  IMPRESSION:  Stable bilateral lung opacities are noted most consistent with pulmonary edema, although pneumonia cannot be excluded. Mild bilateral pleural effusions are noted.   Electronically Signed   By: Sabino Dick M.D.   On: 08/11/2013 10:46   Nm Myocar Single W/spect W/wall Motion And Ef  08/13/2013   CLINICAL DATA:  Chest pain, history hypertension, COPD, diabetes, end-stage renal disease on dialysis, vocal cord cancer post radiation therapy, carotid vascular disease  EXAM: MYOCARDIAL IMAGING WITH SPECT (REST AND PHARMACOLOGIC-STRESS - 2 DAY PROTOCOL)  GATED LEFT VENTRICULAR WALL MOTION STUDY  LEFT VENTRICULAR EJECTION FRACTION  TECHNIQUE: Standard myocardial SPECT imaging was performed after resting intravenous injection of 10 mCi Tc-61m sestamibi. Subsequently, on a second day, intravenous infusion of Lexiscan was performed under the supervision of the Cardiology staff. At peak effect of the drug, 30 mCi Tc-69m sestamibi was injected intravenously and standard myocardial SPECT imaging was performed. Quantitative gated imaging was also performed to evaluate left ventricular wall motion, and estimate left ventricular ejection fraction.  COMPARISON:  None.  FINDINGS: Myocardial profusion SPECT images obtained following pharmacologic stress demonstrate dilatation of the left ventricle.  Large perfusion defect identified at inferior/inferolateral wall left ventricle.  Small perfusion defect identified at anteroseptal wall left ventricle.  Resting exam demonstrates reperfusion of both of these defects.  No pulmonary uptake of tracer.  Mildly decreased left ventricular ejection fraction of 38% is calculated on gated SPECT images following pharmacologic stress.  This is derived from an end-diastolic volume calculation of 162 mL and an end systolic volume of 102 mL.  Wall motion analysis shows no focal wall motion abnormality.  IMPRESSION: Reversible myocardial perfusion defects at the inferior/inferolateral and anteroseptal  walls of the left ventricle.  Mildly decreased left ventricular ejection fraction of 38%.  No focal wall motion abnormalities.   Electronically Signed   By: Lavonia Dana M.D.   On: 08/13/2013 14:22   Nm Myocar Single W/spect W/wall Motion And Ef  08/11/2013   CLINICAL DATA:  Chest pain  EXAM: MYOCARDIAL IMAGING WITH SPECT (REST)  TECHNIQUE: Standard myocardial SPECT imaging was performed after resting intravenous injection of 10 mCi Tc-27m sestamibi. Quantitative gated imaging was also performed to evaluate left ventricular wall motion, and estimate left ventricular ejection fraction.  COMPARISON:  None.  FINDINGS: SPECT imaging without gating was performed. There is moderately diminished activity throughout the inferior wall. There is also a perfusion defect at the apex and a final focal mid anteroseptal perfusion defect.  IMPRESSION: Rest imaging only without gating was performed. There is thinning of the inferior wall. There are focal defects at the apex and mid anteroseptal regions.   Electronically Signed   By: Maryclare Bean M.D.   On: 08/11/2013 11:22   Dg Chest Port 1 View  08/12/2013   CLINICAL DATA:  Followup pulmonary edema.  Status post dialysis.  EXAM: PORTABLE CHEST - 1 VIEW  COMPARISON:  08/11/13  FINDINGS: Asymmetric pulmonary edema shows mild improvement since previous study. Probable small pleural effusions again noted, however there is improved aeration in both lung bases. Heart size is stable.  IMPRESSION: Mild interval improvement in diffuse pulmonary edema. Persistent small pleural effusions.   Electronically Signed   By: Earle Gell M.D.   On: 08/12/2013 13:12   Dg Chest Port 1 View  08/07/2013   CLINICAL DATA:  Shortness of breath, pneumonia  EXAM: PORTABLE CHEST - 1 VIEW  COMPARISON:  DG CHEST 1V PORT dated 08/06/2013; DG CHEST 1V PORT dated 08/02/2013; DG CHEST 2 VIEW dated 04/25/2013; DG CHEST 1V PORT dated 02/16/2013  FINDINGS: Grossly unchanged enlarged cardiac silhouette and mediastinal  contours with atherosclerotic plaque within the thoracic aorta. The pulmonary vasculature is indistinct with cephalization of flow. Interval increase in now small layering bilateral effusions with associated worsening bibasilar opacities, left greater than right. No definite evidence of edema. Grossly unchanged bones including presumed resection of the distal end of the right clavicle.  IMPRESSION: Worsening pulmonary edema, small layering bilateral effusions and associated bibasilar opacities, left greater than right, atelectasis versus infiltrate.   Electronically Signed   By: Sandi Mariscal M.D.   On: 08/07/2013 07:36   Dg Chest Port 1 View  08/06/2013   CLINICAL DATA:  68 year old male with shortness of breath, fever abdominal pain cyanosis. Initial encounter.  EXAM: PORTABLE CHEST - 1 VIEW  COMPARISON:  08/02/2013 and earlier.  FINDINGS: Portable AP semi upright view at 0612 hrs.  Extubated. Decreased bilateral veiling pulmonary opacity. Improved retrocardiac ventilation. Stable cardiac size and mediastinal contours. No pneumothorax. Stable pulmonary vascularity/interstitial opacity.  IMPRESSION: 1. Extubated. 2. Decreased appearance of bilateral pleural effusions and improved bibasilar ventilation. 3. Continued  pulmonary vascular congestion/interstitial edema.   Electronically Signed   By: Lars Pinks M.D.   On: 08/06/2013 06:31   Dg Chest Port 1 View  08/02/2013   CLINICAL DATA:  Respiratory failure.  EXAM: PORTABLE CHEST - 1 VIEW  COMPARISON:  DG CHEST 1V PORT dated 08/01/2013  FINDINGS: Endotracheal tube is approximately 3 cm above the carina. Degree of pulmonary edema at may be mildly improved. There are likely bilateral pleural effusions. The heart remains moderately enlarged.  IMPRESSION: Mild decrease in pulmonary edema.   Electronically Signed   By: Aletta Edouard M.D.   On: 08/02/2013 07:40   Dg Chest Port 1 View  08/01/2013   CLINICAL DATA:  Status post re-intubation  EXAM: PORTABLE CHEST - 1  VIEW  COMPARISON:  05/22/2013  FINDINGS: An endotracheal tube is seen 3.5 cm above the carinal. The cardiac shadow is prominent. Vascular congestion with mild posteriorly layering effusions is identified. A temporary dialysis catheter is not seen on this film. Correlation with the clinical findings is recommended.  IMPRESSION: Vascular congestion and effusions which may be related to volume overload.  Endotracheal tube as described.   Electronically Signed   By: Inez Catalina M.D.   On: 08/01/2013 16:22    Microbiology: Recent Results (from the past 240 hour(s))  MRSA PCR SCREENING  Status: None   Collection Time    08/29/13  5:30 PM      Result Value Ref Range Status   MRSA by PCR NEGATIVE  NEGATIVE Final   Comment:            The GeneXpert MRSA Assay (FDA     approved for NASAL specimens     only), is one component of a     comprehensive MRSA colonization     surveillance program. It is not     intended to diagnose MRSA     infection nor to guide or     monitor treatment for     MRSA infections.     Labs: Basic Metabolic Panel:  Recent Labs Lab 08/29/13 1315 08/30/13 0800  NA 132* 131*  K 5.6* 4.4  CL 86* 86*  CO2 25 24  GLUCOSE 102* 84  BUN 36* 54*  CREATININE 4.39* 5.64*  CALCIUM 9.2 9.5   Liver Function Tests:  Recent Labs Lab 08/29/13 1730  AST 36  ALT 15  ALKPHOS 105  BILITOT 0.8  PROT 6.6  ALBUMIN 2.6*   No results found for this basename: LIPASE, AMYLASE,  in the last 168 hours  Recent Labs Lab 08/29/13 1730  AMMONIA 25   CBC:  Recent Labs Lab 08/29/13 1315 08/30/13 0800  WBC 6.4 6.8  NEUTROABS 5.0  --   HGB 10.5* 11.7*  HCT 32.0* 35.4*  MCV 95.5 93.7  PLT 187 206   Cardiac Enzymes:  Recent Labs Lab 08/29/13 1712 08/30/13  TROPONINI 2.23* 1.33*   BNP: BNP (last 3 results) No results found for this basename: PROBNP,  in the last 8760 hours CBG:  Recent Labs Lab 08/29/13 1710 08/29/13 2301 08/30/13 0043 08/30/13 1245   GLUCAP 70 87 135* 79       Signed:  Darlynn Ricco  [facility] 08/30/2013, 2:31 PM

## 2013-08-30 NOTE — Procedures (Signed)
I was present at this dialysis session, have reviewed the session itself and made  appropriate changes  Kelly Splinter MD (pgr) [phone]    [phone] 08/30/2013, 11:16 AM

## 2013-08-30 NOTE — Evaluation (Signed)
Physical Therapy Evaluation and Discharge Patient Details Name: LOYED WILMES MRN: [id number] DOB: [birth year] Today's Date: 08/30/2013 Time: 5784-6962 PT Time Calculation (min): 23 min  PT Assessment / Plan / Recommendation History of Present Illness  68 year old male with a history of ESRD, PAF, systolic and diastolic CHF who was recently discharged from the hospital after a nine-day stay on 08/15/2013. He was admitted with sepsis during that period of time secondary to peritonitis related to his peritoneal dialysis catheter.   Clinical Impression  Pt adm with the above dx. Pt demonstrated safe functional mobility with RW for transfers and amb. Pt req frequent standing rest breaks during amb to catch is breath, did not experience LOB. Pt lives at home with his wife who is available for home assistance. Pt was participating in [place] prior to adm and reports that he is eager to return to [place]. Pt to benefit from cardiopulmonary PT per MD request. Pt is safe to return home with the assistance of his wife. No further skilled acute PT needed at this time. PT signing off. Please re-consult if services needed in future.    PT Assessment  Patent does not need any further PT services    Follow Up Recommendations  Outpatient PT;Supervision for mobility/OOB (to address generalized weakness;to benefit cardiopulm PT)    Does the patient have the potential to tolerate intense rehabilitation      Barriers to Discharge   none    Equipment Recommendations       Recommendations for Other Services     Frequency      Precautions / Restrictions Precautions Precautions: Fall Restrictions Weight Bearing Restrictions: No   Pertinent Vitals/Pain Vitals were within function limits      Mobility  Bed Mobility Overal bed mobility: Needs Assistance Bed Mobility: Supine to Sit Supine to sit: Supervision General bed mobility comments: v/c to instruct move feet over to sit EOB Transfers Overall  transfer level: Needs assistance Equipment used: Rolling walker (2 wheeled) Transfers: Sit to/from Stand Sit to Stand: Min guard General transfer comment: pt able to stand without assist but became a little uneasy once standing. min guard for safety Ambulation/Gait Ambulation/Gait assistance: Min guard Ambulation Distance (Feet): 125 Feet Assistive device: Rolling walker (2 wheeled) Gait Pattern/deviations: Step-through pattern;Decreased stride length Gait velocity: slow and guarded for falls General Gait Details: pt req frequent standing breaks to catch his breath. LEs became fatigued during amb; no LOB    Exercises     PT Diagnosis:    PT Problem List:   PT Treatment Interventions:       PT Goals(Current goals can be found in the care plan section) Acute Rehab PT Goals Patient Stated Goal: return home and to continue with OPPT  Visit Information  Last PT Received On: 08/30/13 Assistance Needed: +1 History of Present Illness: 68 year old male with a history of ESRD, PAF, systolic and diastolic CHF who was recently discharged from the hospital after a nine-day stay on 08/15/2013. He was admitted with sepsis during that period of time secondary to peritonitis related to his peritoneal dialysis catheter.        Prior [place] expects to be discharged to:: Private residence Living Arrangements: Spouse/significant other Available Help at Discharge: Family Type of Home: House Home Access: Stairs to enter Technical brewer of Steps: 1 Entrance Stairs-Rails: None Home Layout: One level [place]: Walker - 2 wheels;Grab bars - tub/shower;Bedside commode Prior Function Level of Independence: Needs assistance Gait / Transfers  Assistance Needed: RW and some assist from wife Comments: tripped over dog and fell about 3 weeks ago Communication Communication: No difficulties    Cognition  Cognition Arousal/Alertness: Awake/alert (exhausted  from dialysis ) Behavior During Therapy: WFL for tasks assessed/performed Overall Cognitive Status: Within Functional Limits for tasks assessed    Extremity/Trunk Assessment Upper Extremity Assessment Upper Extremity Assessment: Generalized weakness Lower Extremity Assessment Lower Extremity Assessment: Generalized weakness   Balance Balance Overall balance assessment: Needs assistance Sitting-balance support: No upper extremity supported Sitting balance-Leahy Scale: Good Standing balance support: Bilateral upper extremity supported Standing balance-Leahy Scale: Fair Standing balance comment: req RW to support in standing and for resting break support General Comments General comments (skin integrity, edema, etc.): pt expressed being fatigued and "worn out" by staying at the hospital.   End of Session PT - End of Session Equipment Utilized During Treatment: Gait belt;Oxygen (RW) Activity Tolerance: Patient limited by fatigue (limited by SOB with amb) Patient left: in bed;with call bell/phone within reach;with family/visitor present Nurse Communication: Mobility status  GP     Manley Mason SPT 08/30/2013, 4:57 PM  Agree with above assessment.  Kittie Plater, PT, DPT Pager #: [phone] Office #: [phone]

## 2013-08-31 NOTE — Care Management Note (Signed)
Late entry:    CARE MANAGEMENT NOTE 08/31/2013  Patient:  Victor Lane, Victor Lane   Account Number:  [id number]  Date Initiated:  08/31/2013  Documentation initiated by:  Twanna Resh  Subjective/Objective Assessment:   Noted order for HHPT placed on 08/30/13 and pt d/c that same afternoon.     Action/Plan:   08/31/2013 As pt did not appear to have HHPT arranged this CM contacte the pt wife re this and she states that the pt is currently receiving outpatient PT and wishes to continue with that rehab, therefore HHPT is not needed at this time.   Anticipated DC Date:     Anticipated DC Plan:  HOME/SELF CARE         Choice offered to / List presented to:             Status of service:  Completed, signed off Medicare Important Message given?   (If response is "NO", the following Medicare IM given date fields will be blank) Date Medicare IM given:   Date Additional Medicare IM given:    Discharge Disposition:  HOME/SELF CARE  Per UR Regulation:    If discussed at Long Length of Stay Meetings, dates discussed:    Comments:

## 2013-09-20 DIAGNOSIS — N186 End stage renal disease: Secondary | ICD-10-CM

## 2013-09-20 HISTORY — DX: End stage renal disease: N18.6

## 2013-09-28 ENCOUNTER — Encounter: Payer: Medicare Other | Admitting: Cardiology

## 2013-10-25 ENCOUNTER — Telehealth: Payer: Self-pay | Admitting: Internal Medicine

## 2013-10-25 DIAGNOSIS — J449 Chronic obstructive pulmonary disease, unspecified: Secondary | ICD-10-CM

## 2013-10-25 NOTE — Telephone Encounter (Signed)
Spoke with pt's wife and she states that at pt's last ov MW had said that if pt felt better and thought he no longer needed oxygen that we could order a test to check this and then possibly d/c oxygen therapy.  I verified this with MW last ov note.  Order placed for ONO on RA.  Instructed pt's wife that she would receive a call about having this done.

## 2013-11-05 ENCOUNTER — Encounter: Payer: Self-pay | Admitting: Internal Medicine

## 2013-11-06 ENCOUNTER — Telehealth: Payer: Self-pay | Admitting: Internal Medicine

## 2013-11-06 NOTE — Telephone Encounter (Signed)
Per MW- ONO pos- needs to continue o2 with sleep 2lpm  Spoke with pt and notified of results per Dr. Melvyn Novas. Pt verbalized understanding and denied any questions.

## 2013-11-15 ENCOUNTER — Encounter: Payer: Self-pay | Admitting: Podiatry

## 2013-11-15 ENCOUNTER — Ambulatory Visit (INDEPENDENT_AMBULATORY_CARE_PROVIDER_SITE_OTHER): Payer: Medicare Other | Admitting: Podiatry

## 2013-11-15 VITALS — BP 118/65 | HR 76 | Resp 18 | Ht 70.0 in | Wt 158.0 lb

## 2013-11-15 DIAGNOSIS — L600 Ingrowing nail: Secondary | ICD-10-CM

## 2013-11-15 DIAGNOSIS — Z789 Other specified health status: Secondary | ICD-10-CM

## 2013-11-15 DIAGNOSIS — E1149 Type 2 diabetes mellitus with other diabetic neurological complication: Secondary | ICD-10-CM

## 2013-11-15 DIAGNOSIS — E1159 Type 2 diabetes mellitus with other circulatory complications: Secondary | ICD-10-CM

## 2013-11-15 DIAGNOSIS — I6529 Occlusion and stenosis of unspecified carotid artery: Secondary | ICD-10-CM

## 2013-11-15 NOTE — Progress Notes (Signed)
   Subjective:    Patient ID: Victor Lane, male    DOB: 1945/07/05, 68 y.o.   MRN: [id number]  HPI Comments: N debridement and diabetic foot exam L 1 - 10 toenails, left 1st toenail has fallen off D and O long-term C elongated, tender toenails A diabetes, difficult to cut T home pedicure  Pt states August of 2015, kidney failure the Congestive heart failure and is now on dialysis.      Review of Systems  Constitutional: Positive for unexpected weight change.  Genitourinary:       Dialysis pt.  Skin: Positive for rash.  All other systems reviewed and are negative.      Objective:   Physical Exam Orientated x3 white male presents with his wife  Vascular: DP pulses 2/4 bilaterally PT pulses were 1/4 right PT left 0/4  Neurological: Sensation to 10 g monofilament wire intact 2/4 right and 0/4 left Vibratory sensation nonreactive bilaterally Ankle reflexes nonreactive bilaterally  Dermatological:  Scaling webspaces 3 and 4 right: 2, 3, 4 left Incurvated toenails, elongated toenails with distal color changes x10  Musculoskeletal: Hammertoe deformities 2-5 bilaterally       Assessment & Plan:   Assessment: Diabetic peripheral neuropathy Decrease pedal pulses suggestive of possible peripheral arterial disease Ingrowing toenails without symptoms Tinea pedis webspaces right and left Hammertoe deformities bilaterally  Plan: Advised patient results of diabetic foot examination today Refer patient to vascular lab for a lower extremity arterial Doppler for the indication of decrease pedal pulses and diabetes Debrided all 10 toenails Rx over-the-counter Lamisil cream to apply to webspaces on right and left feet daily x30 days  Notify patient results of arterial Doppler examination

## 2013-11-15 NOTE — Patient Instructions (Signed)
Apply Lamisil cream over-the-counter to the web spaces on the right and left toes daily x30 days Our office will schedule a vascular examination for lower extremity arterial Doppler Will notify you of the results

## 2013-11-16 ENCOUNTER — Encounter: Payer: Self-pay | Admitting: Podiatry

## 2013-11-17 ENCOUNTER — Encounter: Payer: Medicare Other | Admitting: Cardiology

## 2013-11-20 ENCOUNTER — Ambulatory Visit (INDEPENDENT_AMBULATORY_CARE_PROVIDER_SITE_OTHER): Payer: Medicare Other | Admitting: Cardiology

## 2013-11-20 ENCOUNTER — Encounter: Payer: Self-pay | Admitting: Cardiology

## 2013-11-20 VITALS — BP 128/65 | HR 89 | Ht 70.0 in | Wt 168.0 lb

## 2013-11-20 DIAGNOSIS — I509 Heart failure, unspecified: Secondary | ICD-10-CM

## 2013-11-20 DIAGNOSIS — I4892 Unspecified atrial flutter: Secondary | ICD-10-CM

## 2013-11-20 DIAGNOSIS — I5042 Chronic combined systolic (congestive) and diastolic (congestive) heart failure: Secondary | ICD-10-CM

## 2013-11-20 DIAGNOSIS — I77810 Thoracic aortic ectasia: Secondary | ICD-10-CM | POA: Insufficient documentation

## 2013-11-20 DIAGNOSIS — I6529 Occlusion and stenosis of unspecified carotid artery: Secondary | ICD-10-CM

## 2013-11-20 DIAGNOSIS — I255 Ischemic cardiomyopathy: Secondary | ICD-10-CM | POA: Insufficient documentation

## 2013-11-20 DIAGNOSIS — I10 Essential (primary) hypertension: Secondary | ICD-10-CM

## 2013-11-20 DIAGNOSIS — I2589 Other forms of chronic ischemic heart disease: Secondary | ICD-10-CM

## 2013-11-20 DIAGNOSIS — I2510 Atherosclerotic heart disease of native coronary artery without angina pectoris: Secondary | ICD-10-CM | POA: Insufficient documentation

## 2013-11-20 NOTE — Patient Instructions (Signed)
Your physician recommends that you continue on your current medications as directed. Please refer to the Current Medication list given to you today.  Your physician has recommended that you wear an event monitor. Event monitors are medical devices that record the heart's electrical activity. Doctors most often [place] these monitors to diagnose arrhythmias. Arrhythmias are problems with the speed or rhythm of the heartbeat. The monitor is a small, portable device. You can wear one while you do your normal daily activities. This is usually used to diagnose what is causing palpitations/syncope (passing out).  Your physician recommends that you schedule a follow-up appointment in: 3 [place]. \

## 2013-11-20 NOTE — Progress Notes (Signed)
[address], [address], [place]  [address] Phone: [phone] Fax:  [phone]  Date:  11/20/2013   ID:  [name], DOB 1945/11/09, MRN [id number]  PCP:  [name], MD  Cardiologist:  Fransico Him, MD     History of Present Illness: 68 year old with extensive PMH including ESRD/stage 4 CKD with HD on M/W/F, w/ PD catheter placement on 08/01/13 . Presented to ER on 2/15 am with fever, abd pain , weakness. B/P marginal ~106, tachycardia. Lactate 13 . Felt to have SIRS/Sepsis . He also had acute on chronic systolic CHF. He ruled in for NSTEMI felt secondary to demand ischemia.   2D echo showed EF 40-45% and Lexiscan myoview showed reversible defects in the inferior, inferolateral and anteroseptal walls with EF 38%.  He underwent cath revealing severe CAD with mid RCA occlusion, distal LAD stenosis, moderate disease of the left circ.  Medical therapy was recommended.  He also had Paroxysmal atrial flutter that spontaneously resolved after IV Cardizem. His CHADS VASC score is 4 but it was recommended that he not be anticoagulated due to weakness with frequent falls at home, especially since this was his first episode of PAflutter in the setting of acute influenza with respiratory failure.  He is now on Peritoneal dialysis and is doing well with that.  He denies any SOB or DOE.  He denies any chest pain.  He says his LE edema has resolved.  He denies any palpitations or dizziness.     Wt Readings from Last 3 Encounters:  11/20/13 168 lb (76.204 kg)  11/15/13 158 lb (71.668 kg)  08/30/13 147 lb 0.8 oz (66.7 kg)     Past Medical History  Diagnosis Date  . Chest pain     non-ischemic stress test 02/2011 (Dr. Radford Pax)  . Carotid artery occlusion     s/p CEA  . Neuropathy   . Arthritis   . H/O vitamin D deficiency   . Hearing loss   . Hypercholesteremia   . 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 S/P radiation  therapy 01/24/2013-03/14/2013    Larynx/glottis / 63 Gy in 28 fractions  . Hypertension   . GERD (gastroesophageal reflux disease)   . Complication of anesthesia     hard to wake up and then panics waking up  . Sleep apnea     to request from Dr.Fried was done about 20yrs ago but no cpap d/t weight loss  . Fibromyalgia     takes tramadol daily as needed  . Myalgia and myositis   . Peripheral neuropathy   . History of gout   . History of colon polyps   . ESRD on dialysis     - Stage 5- follwed by Dr Sharlotte Alamo in [place]  . Diabetes mellitus   . Cataracts, bilateral   . COPD (chronic obstructive pulmonary disease)   . Dilated aortic root   . Aortic valve sclerosis   . Diastolic dysfunction     Current Outpatient Prescriptions  Medication Sig Dispense Refill  . ALPRAZolam (XANAX) 0.5 MG tablet Take 0.5 mg by mouth at bedtime as needed.       Marland Kitchen aspirin 81 MG tablet Take 81 mg by mouth daily.       . calcium acetate (PHOSLO) 667 MG capsule Take 1,334 mg by mouth 3 (three) times daily with meals.      Marland Kitchen  diphenhydrAMINE (BENADRYL) 25 MG tablet Take 50 mg by mouth every 6 (six) hours as needed for itching.       . esomeprazole (NEXIUM) 40 MG capsule Take 40 mg by mouth daily before breakfast.      . glipiZIDE (GLUCOTROL XL) 2.5 MG 24 hr tablet Take 2.5 mg by mouth daily as needed (diabetes).      . metoprolol tartrate (LOPRESSOR) 25 MG tablet Take 0.5 tablets (12.5 mg total) by mouth 2 (two) times daily.      . Nutritional Supplements (FEEDING SUPPLEMENT, NEPRO CARB STEADY,) LIQD Take 237 mLs by mouth 2 (two) times daily between meals.    0  . traMADol (ULTRAM) 50 MG tablet Take 1-2 tablets (50-100 mg total) by mouth every 6 (six) hours as needed for moderate pain or severe pain.  40 tablet  0  . triamcinolone cream (KENALOG) 0.1 % Apply 1 application topically 2 (two) times daily. Apply to knee and lower leg.       No current facility-administered medications for this visit.     Allergies:    Allergies  Allergen Reactions  . Amoxicillin     Causes upset stomach  . Fentanyl     Confusion when patch applied  . Nsaids Other (See Comments)    Severe kidney disease/ESRD    Social History:  The patient  reports that he quit smoking about 18 years ago. His smoking use included Cigarettes. He has a 62 pack-year smoking history. He has never used smokeless tobacco. He reports that he does not drink alcohol or use illicit drugs.   Family History:  The patient's family history is not on file. He was adopted.   ROS:  Please see the history of present illness.      All other systems reviewed and negative.   PHYSICAL EXAM: VS:  BP 128/65  Pulse 89  Ht 5\' 10"  (1.778 m)  Wt 168 lb (76.204 kg)  BMI 24.11 kg/m2 Well nourished, well developed, in no acute distress HEENT: normal Neck: no JVD Cardiac:  normal S1, S2; RRR; no murmur Lungs:  clear to auscultation bilaterally, no wheezing, rhonchi or rales Abd: soft, nontender, no hepatomegaly Ext: trace edema Skin: warm and dry Neuro:  CNs 2-12 intact, no focal abnormalities noted       ASSESSMENT AND PLAN:  1. Chronic combined systolic/diastolic CHF NYHA class I - well compensated.  He is doing very well. 2. Severe ASCAD with mid RCA occlusion and faint collaterals, distal LAD stenosis with no ischemia in the apex on nuclear imaging and moderate disease in the left circ/ramus.  Currently with no angina on medical therapy. - continue ASA/beta blocker 3. Mild to moderate LV dysfunction EF 40-45% by recent echo - continue beta blocker 4.  Paroxysmal atrial flutter - one episode in the setting of acute influenza.  His CHADS VASC score is 4.  He was not sent home on anticoagulation due to increased weakness and falls at home.  I would like to get a 4 week event monitor to assess for silent atrial arrhythmias before subjecting him to long term anticoagulation since this episode was in setting of acute respiratory failure  with hypoxia.   - Event monitor to assess for silent arrhythmias.  Followup with me in 3 months  Signed, Fransico Him, MD 11/20/2013 3:26 PM

## 2013-11-24 ENCOUNTER — Ambulatory Visit
Admission: RE | Admit: 2013-11-24 | Discharge: 2013-11-24 | Disposition: A | Discharge status: Home / Self Care | Payer: Medicare Other | Source: Ambulatory Visit | Attending: Radiation Oncology | Admitting: Radiation Oncology

## 2013-11-24 VITALS — BP 115/58 | HR 83 | Temp 98.7°F | Wt 170.0 lb

## 2013-11-24 DIAGNOSIS — C320 Malignant neoplasm of glottis: Secondary | ICD-10-CM

## 2013-11-24 DIAGNOSIS — E039 Hypothyroidism, unspecified: Secondary | ICD-10-CM

## 2013-11-24 NOTE — Progress Notes (Addendum)
Patient for routine follow up completion of radiation in September 2014 to head/neck for squamous cell carcinoma of the larynnx.Denies pain.Has difficulty with swalloing at times.Eating well, no longer having problems with dry mouth.taste buds back to normal.Patient now does peritoneal dialysis daily.Hospitalized 3 times since completion of radiation for fluid build up of heart and lungs and surgery for peritoneal dialysis.No hospitalization in 3 months.States he has been doing much better since starting peritoneal dialysis.Seen by ENT in April and states laryngoscope was negative.

## 2013-11-25 ENCOUNTER — Encounter: Payer: Self-pay | Admitting: Radiation Oncology

## 2013-11-25 NOTE — Progress Notes (Signed)
Radiation Oncology         ([phone]) [phone] ________________________________  Name: Victor Lane MRN: [id number]  Date: 11/24/2013  DOB: 01-06-1946  Follow-Up Visit Note  Oupatient  CC: FRIED, [name], MD  [name], MD  Diagnosis and Prior Radiotherapy:   [id number] Glottic Squamous Cell Carcinoma  Indication for treatment: curative  Radiation treatment dates: 01/24/2013-03/14/2013  Site/dose: Larynx/glottis / 63 Gy in 28 fractions  Narrative:  The patient returns today for routine follow-up.  Energy is improving.  TSH was normal as of March.  No inpatient stays for about 3 months. He is doing well with peritoneal dialysis which has improved his QOL.   He reports a "clear" laryngoscopy with Dr Janace Hoard in April and this is confirmed by Dr Janace Hoard' note. Otolaryngology followup is scheduled again in ~ 2-64mo.                                ALLERGIES:  is allergic to amoxicillin; fentanyl; and nsaids.  Meds: Current Outpatient Prescriptions  Medication Sig Dispense Refill  . ALPRAZolam (XANAX) 0.5 MG tablet Take 0.5 mg by mouth at bedtime as needed.       Marland Kitchen aspirin 81 MG tablet Take 81 mg by mouth daily.       Marland Kitchen glipiZIDE (GLUCOTROL XL) 2.5 MG 24 hr tablet Take 2.5 mg by mouth daily as needed (diabetes).      . metoprolol tartrate (LOPRESSOR) 25 MG tablet Take 0.5 tablets (12.5 mg total) by mouth 2 (two) times daily.      . Nutritional Supplements (FEEDING SUPPLEMENT, NEPRO CARB STEADY,) LIQD Take 237 mLs by mouth 2 (two) times daily between meals.    0  . traMADol (ULTRAM) 50 MG tablet Take 1-2 tablets (50-100 mg total) by mouth every 6 (six) hours as needed for moderate pain or severe pain.  40 tablet  0  . triamcinolone cream (KENALOG) 0.1 % Apply 1 application topically 2 (two) times daily. Apply to knee and lower leg.      . calcium acetate (PHOSLO) 667 MG capsule Take 1,334 mg by mouth 3 (three) times daily with meals.      . diphenhydrAMINE (BENADRYL) 25 MG tablet Take 50 mg by mouth  every 6 (six) hours as needed for itching.       . esomeprazole (NEXIUM) 40 MG capsule Take 40 mg by mouth daily before breakfast.       No current facility-administered medications for this encounter.    Physical Findings: The patient is in no acute distress. Patient is alert and oriented.  weight is 170 lb (77.111 kg). His temperature is 98.7 F (37.1 C). His blood pressure is 115/58 and his pulse is 83. His oxygen saturation is 96%. .    Oropharynx- mucous membranes moist, no lesions. Neck - no cervical or SCV adenopathy.  Skin healthy, intact.  Laryngoscopy offered but declined due to close ENT f/u.  Lab Findings: Lab Results  Component Value Date   WBC 6.8 08/30/2013   HGB 11.7* 08/30/2013   HCT 35.4* 08/30/2013   MCV 93.7 08/30/2013   PLT 206 08/30/2013   Lab Results  Component Value Date   TSH 2.404 08/29/2013    Radiographic Findings: No results found.  Impression/Plan:  1) Head and Neck Cancer Status: NED   2) Nutritional Status: no active issues related to cancer diagnosis; defer any dietary recommendations to nephrology - PEG tube: removed  3) Risk Factors: The patient has been educated about risk factors including alcohol and tobacco abuse; they understand that avoidance of alcohol and tobacco is important to prevent recurrences as well as other cancers. Hasn't smoked for about 2 decades.   4) Swallowing: Good - only issue is breads  5) Energy: improving. TSH is normal as of March  6) Social: No active social issues to address at this time.  Doing well with peritoneal dialysis which has improved his QOL.   8) Other: Otolaryngology followup is scheduled in ~ 2-51mo .  9) Follow-up with me in 6 months with TSH. No imaging needed per NCCN guidelines unless new sx/sx develop and warrant this.  The patient was encouraged to call with any issues or questions before then. _____________________________________   Eppie Gibson, MD

## 2013-11-27 ENCOUNTER — Encounter (INDEPENDENT_AMBULATORY_CARE_PROVIDER_SITE_OTHER): Payer: Medicare Other

## 2013-11-27 ENCOUNTER — Telehealth: Payer: Self-pay | Admitting: *Deleted

## 2013-11-27 ENCOUNTER — Encounter: Payer: Self-pay | Admitting: *Deleted

## 2013-11-27 DIAGNOSIS — I4892 Unspecified atrial flutter: Secondary | ICD-10-CM

## 2013-11-27 NOTE — Progress Notes (Signed)
Patient ID: Victor Lane, male   DOB: 17-Jan-1946, 68 y.o.   MRN: [id number] [employer] 30 day cardiac event monitor applied to patient.

## 2013-11-27 NOTE — Telephone Encounter (Signed)
Called patient to inform of lab on 05-25-14 @ 10 am, prior to 10:20 am fu that day, spoke with patient and he is aware of this.

## 2013-11-29 ENCOUNTER — Encounter: Payer: Self-pay | Admitting: Cardiology

## 2013-12-18 ENCOUNTER — Ambulatory Visit (HOSPITAL_COMMUNITY)
Admission: RE | Admit: 2013-12-18 | Discharge: 2013-12-18 | Disposition: A | Discharge status: Home / Self Care | Payer: Medicare Other | Source: Ambulatory Visit | Attending: Surgery | Admitting: Surgery

## 2013-12-18 ENCOUNTER — Other Ambulatory Visit: Payer: Self-pay | Admitting: Podiatry

## 2013-12-18 ENCOUNTER — Encounter (HOSPITAL_COMMUNITY): Payer: Medicare Other

## 2013-12-18 DIAGNOSIS — R0989 Other specified symptoms and signs involving the circulatory and respiratory systems: Secondary | ICD-10-CM

## 2014-01-08 ENCOUNTER — Other Ambulatory Visit: Payer: Self-pay | Admitting: Family Medicine

## 2014-01-08 DIAGNOSIS — N644 Mastodynia: Secondary | ICD-10-CM

## 2014-01-09 ENCOUNTER — Telehealth: Payer: Self-pay | Admitting: Cardiology

## 2014-01-09 NOTE — Telephone Encounter (Signed)
Pt is aware.  

## 2014-01-09 NOTE — Telephone Encounter (Signed)
Please let patient know that heart monitor showed NSR with occasional PAC's which are benign

## 2014-01-15 ENCOUNTER — Other Ambulatory Visit: Payer: Medicare Other

## 2014-01-18 ENCOUNTER — Ambulatory Visit
Admission: RE | Admit: 2014-01-18 | Discharge: 2014-01-18 | Disposition: A | Discharge status: Home / Self Care | Payer: Medicare Other | Source: Ambulatory Visit | Attending: Family Medicine | Admitting: Family Medicine

## 2014-01-18 DIAGNOSIS — N644 Mastodynia: Secondary | ICD-10-CM

## 2014-01-30 ENCOUNTER — Telehealth: Payer: Self-pay | Admitting: Cardiology

## 2014-01-30 NOTE — Telephone Encounter (Signed)
Received request from Nurse fax box, documents faxed for surgical clearance. To: [employer] Fax number: [phone] Attention: [phone]/km

## 2014-02-12 ENCOUNTER — Encounter (HOSPITAL_COMMUNITY): Payer: Self-pay | Admitting: Pharmacy Technician

## 2014-02-13 ENCOUNTER — Other Ambulatory Visit (HOSPITAL_COMMUNITY): Payer: Self-pay | Admitting: *Deleted

## 2014-02-13 NOTE — Pre-Procedure Instructions (Addendum)
Victor Lane [name]  02/13/2014   Your procedure is scheduled on:  Thursday, February 22, 2014 at 1:15 PM.   Report to [facility] Entrance "A" Admitting Office at 11:15 AM.   Call this number if you have problems the morning of surgery: [phone]   Remember:   Do not eat food or drink liquids after midnight Wednesday, 02/21/14.   Take these medicines the morning of surgery with A SIP OF WATER: metoprolol tartrate (LOPRESSOR), omeprazole (PRILOSEC), traMADol (ULTRAM) - if needed.   Do not wear jewelry.  Do not wear lotions, powders, or color. You may wear deodorant.  Men may shave face and neck.  Do not bring valuables to the hospital.  [facility] is not responsible for any belongings or valuables.               Contacts, dentures or bridgework may not be worn into surgery.  Leave suitcase in the car. After surgery it may be brought to your room.  For patients admitted to the hospital, discharge time is determined by your   treatment team.               Patients discharged the day of surgery will not be allowed to drive home.    Please read over the following fact sheets that you were given: Pain Booklet, Coughing and Deep Breathing and Surgical Site Infection Prevention

## 2014-02-14 ENCOUNTER — Encounter (HOSPITAL_COMMUNITY)
Admission: RE | Admit: 2014-02-14 | Discharge: 2014-02-14 | Disposition: A | Discharge status: Home / Self Care | Payer: Medicare Other | Source: Ambulatory Visit | Attending: Orthopedic Surgery | Admitting: Orthopedic Surgery

## 2014-02-14 DIAGNOSIS — Z01818 Encounter for other preprocedural examination: Secondary | ICD-10-CM | POA: Insufficient documentation

## 2014-02-14 DIAGNOSIS — G5620 Lesion of ulnar nerve, unspecified upper limb: Secondary | ICD-10-CM | POA: Diagnosis present

## 2014-02-14 HISTORY — DX: Atherosclerotic heart disease of native coronary artery without angina pectoris: I25.10

## 2014-02-14 NOTE — Progress Notes (Signed)
No presurgical orders, Victor Lane notified office

## 2014-02-20 NOTE — Progress Notes (Signed)
Call to [facility] therapy dept. [phone], spoke with Langley Gauss, she will fax notes fr. Visits with Dr. Darnell Level. On 02/08/2014 & 01/15/2014.  Lab results will not be available until; tomorrow- 02/21/2014. Pt. Had CMET & CBC & diff drawn today.

## 2014-02-20 NOTE — Progress Notes (Signed)
Call to [facility], they inform me that pt. Is seen at [facility]/dialysis office,. Call to [facility] & they report pt. Is seen at [facility] for PD, phone no. was furnished.

## 2014-02-20 NOTE — Progress Notes (Signed)
Call to A. Zelenak,PA-C alerting to Dr. Theodosia Blender remark on the surgical clearance note. Pt.'s chart will be left for further review.

## 2014-02-21 ENCOUNTER — Encounter (HOSPITAL_COMMUNITY): Payer: Self-pay

## 2014-02-21 NOTE — Progress Notes (Signed)
Anesthesia Chart Review:  Patient is a 68 year old male posted for left ulnar nerve release and or transposition on 02/22/14 by Dr. Caralyn Guile.    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 He underwent PD catheter insertion on 08/01/13.  He was rehospitalized 08/06/13-08/15/13 for acute on chronic respiratory failure, acute on chronic systolic CHF, paroxsymal aflutter, elevated troponin (NSTEMI-likely demand ischemia) in the setting of sepsis/peritonitis (extension tubing had been pulled out of catheter) and possible healthcare acquired PNA.  He was treated with IV antibiotics and seen by cardiology and ultimately had a cardiac cath showing chronically occluded RCA with faint collaterals and moderate-severe LAD/CX stenosis, moderate LV dysfunction with medical treatment recommended. In regards to his afib/flutter, it resolved with IV Cardizem.  His CHADS VASC score was 4 so it was recommended that he not be anticoagulated due to weakness with frequent falls at home.  For anesthesia history, he reported agitation/panic when awakens from anesthesia. Also he required reintubation in the PACU following his PD catheter insertion on 08/01/13 due to "weakness following lap surgery with paralytic and reversal. Upon arrival to PACU patient being bag ventilated and with poor motor tone. BG wnl. Decision to re intubate and wean ventilatory support once improved motor tone."  Cardiologist is Dr. Fransico Him who was notified of plans for surgery.  She felt he was moderate to high risk of intraoperative or perioperative cardiac event. PCP is Dr. Briscoe Deutscher. Pulmonologist is Dr. Melvyn Novas. ENT Dr. Janace Hoard. RAD-ONC  Dr. Eppie Gibson. Nephrologist is [name], records pending.  EKG on 08/29/13 showed NSR, prolonged QT. No significant change from last tracing.  Lifewatch 30 day event monitor in 11/2013 showed: NSR with occasional PAC's.  Nuclear stress test on 08/13/13 showed: Reversible myocardial perfusion defects at the inferior/inferolateral and anteroseptal walls of the left ventricle. Mildly decreased left ventricular ejection fraction of 38%. No focal wall motion abnormalities. This lead to a Cardiac cath on 08/14/13 (Dr. Glenetta Hew) that showed: Hemodynamics:  - Central Aortic / Mean Pressures: 113/51 mmHg; 75 mmHg  - Left Ventricular Pressures / EDP: 120/4 mmHg; 5 mmHg Left Ventriculography: Not performed  - Recent echocardiogram showed EF of 40-45% a nuclear stress test showed EF of 38% Coronary Anatomy: Moderate to severe calcification of the proximal Left Coronary System.  Left Main: Short large-caliber vessel that bifurcates into the LAD and Circumflex. Angiographically normal. - LAD: Begin a large caliber vessel tapers to a moderate the proximal segment. There are 2 small-moderate caliber diagonal branches that are relatively normal. Distally, well beyond D2 there is a focal roughly 70-80% stenosis with brisk downstream flow.   - Left Circumflex: Large-caliber vessel with proximal eccentric 60% calcified stenosis after which the vessel bifurcates into OM 1, and the AV groove circumflex. The AV groove circumflex terminates in a small OM 2 and the posterolateral vessel. Just at this bifurcation there is a focal 80% stenosis in a 1.5-2 mm vessel.   OM1: Moderate caliber vessel with calcification and mild luminal irregularities proximally, then bifurcates distally near the inferolateral apex.   OM 2: Relatively small caliber vessel, diffusely diseased. - Ramus intermedius: Small-caliber vessel with mid 90% stenosis. Less than 2 mm  RCA: Small to moderate  caliber, dominant vessel with diffuse  disease throughout the mid vessel and then 100% occluded after the major RV marginal branch. The RV marginal branch itself has a focal 90% stenosis as well. There are bridging collaterals until distally faintly enough to see flow down a posterior lateral vessel as well as a posterior descending artery.  - RPDA: Grade 1 collaterals from septal perforators and distal LAD.  - RPL Sysytem: Minimal filling from circumflex collaterals. After reviewing the initial angiography, the culprit lesion was thought to be mid occlusion of the RCA which appears to be chronic. There is also the distal LAD lesion.  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  - The distal LAD lesion, does angiographically appeared to be potentially significant, however there was no evidence of ischemia in the apex, making it less likely to be physiologically significant in the absence of anginal symptoms.  - Moderate disease noted in the circumflex, with a more significant lesion in the small Ramus Intermedius - Relatively normal LVEDP with mild-moderately reduced LVEF by noninvasive imaging.  - Multiple comorbidities making him a less than appealing PCI candidate at this particular juncture. PLAN OF CARE:  - Return to nursing floor for standard post catheterization care.  - He has had atrial fibrillation while in the hospital, I will restart heparin 8 hours post sheath removal.  - Recommendation at this particular time would be to optimize medical therapy of the existing Coronary Artery Disease until he is fully recovered from his recent critical illness. If in the future he were to demonstrate clear signs of ischemic symptoms, LAD PCI to be considered. - Recommend clinical correlation with the patient's symptoms and ongoing illness/comorbidities to determine the best course of action going forward. One questioned as  to be his ability of dual antiplatelet therapy.  Echo on 08/07/13 showed:  - Left ventricle: The cavity size was normal. There was mild concentric hypertrophy. Systolic function was mildly to moderately reduced. The estimated ejection fraction was in the range of 40% to 45%. Wall motion was normal; there were no regional wall motion abnormalities. Features are consistent with a pseudonormal left ventricular filling pattern, with concomitant abnormal relaxation and increased filling pressure (grade 2 diastolic dysfunction). Doppler parameters are consistent with highly elevated ventricular end-diastolic filling pressure. - Aortic valve: Trileaflet; moderately thickened, moderately calcified leaflets. Cusp separation was moderately reduced. Transvalvular velocity was within the normal range. There was no stenosis. Moderate regurgitation. - Aortic root: The aortic root was normal in size. - Mitral valve: Calcified annulus. Mildly thickened leaflets. Mild regurgitation. - Left atrium: The atrium was moderately dilated. - Right ventricle: The cavity size was moderately dilated. Wall thickness was normal. Systolic function was normal. - Tricuspid valve: Moderate regurgitation. - Pulmonic valve: No regurgitation. - Pulmonary arteries: Systolic pressure was within the normal range. PA peak pressure: 7mm Hg (S). - Pericardium, extracardiac: There is a large left pleural effusion. There was no pericardial effusion.  Iliac duplex on 07/08/12 showed: bilateral hemodynamically significant iliac artery stenosis.  Carotid duplex on 07/08/12 showed patent left CEA, 60-79% RICA stenosis, antegrade vertebral flow.  His last CXR on 08/29/13 showed CHF with small pleural effusions.  Will plan to repeat CXR on arrival.   PFTs on 06/29/13 showed: FVC 3.01 (69%), FEV1 2.06 (63%).  He had a CBC and CMET at the [place] office on 02/20/14.  Results are suppose to be  back today.  Request as already been sent for  results to be faxed to [phone].  If received, he will likely only need an ISTAT on arrival.   I have reviewed above with anesthesiologist Dr. Oletta Lamas.  He will be further evaluated on the day of surgery.  [facility] Short Stay Center/Anesthesiology Phone [phone] 02/21/2014 1:48 PM

## 2014-02-22 ENCOUNTER — Encounter (HOSPITAL_COMMUNITY)
Admission: RE | Disposition: A | Discharge status: Home / Self Care | Payer: Self-pay | Source: Ambulatory Visit | Attending: Orthopedic Surgery

## 2014-02-22 ENCOUNTER — Ambulatory Visit (HOSPITAL_COMMUNITY): Payer: Medicare Other | Admitting: Anesthesiology

## 2014-02-22 ENCOUNTER — Ambulatory Visit (HOSPITAL_COMMUNITY): Payer: Medicare Other

## 2014-02-22 ENCOUNTER — Encounter (HOSPITAL_COMMUNITY): Payer: Self-pay | Admitting: *Deleted

## 2014-02-22 ENCOUNTER — Encounter (HOSPITAL_COMMUNITY): Payer: Medicare Other | Admitting: Vascular Surgery

## 2014-02-22 ENCOUNTER — Ambulatory Visit (HOSPITAL_COMMUNITY)
Admission: RE | Admit: 2014-02-22 | Discharge: 2014-02-22 | Disposition: A | Discharge status: Home / Self Care | Payer: Medicare Other | Source: Ambulatory Visit | Attending: Orthopedic Surgery | Admitting: Orthopedic Surgery

## 2014-02-22 DIAGNOSIS — N186 End stage renal disease: Secondary | ICD-10-CM | POA: Insufficient documentation

## 2014-02-22 DIAGNOSIS — Z8521 Personal history of malignant neoplasm of larynx: Secondary | ICD-10-CM | POA: Diagnosis not present

## 2014-02-22 DIAGNOSIS — Z8639 Personal history of other endocrine, nutritional and metabolic disease: Secondary | ICD-10-CM | POA: Diagnosis not present

## 2014-02-22 DIAGNOSIS — G4730 Sleep apnea, unspecified: Secondary | ICD-10-CM | POA: Insufficient documentation

## 2014-02-22 DIAGNOSIS — Z862 Personal history of diseases of the blood and blood-forming organs and certain disorders involving the immune mechanism: Secondary | ICD-10-CM | POA: Diagnosis not present

## 2014-02-22 DIAGNOSIS — I519 Heart disease, unspecified: Secondary | ICD-10-CM | POA: Insufficient documentation

## 2014-02-22 DIAGNOSIS — E7800 Pure hypercholesterolemia, unspecified: Secondary | ICD-10-CM | POA: Diagnosis not present

## 2014-02-22 DIAGNOSIS — Z87891 Personal history of nicotine dependence: Secondary | ICD-10-CM | POA: Diagnosis not present

## 2014-02-22 DIAGNOSIS — I2510 Atherosclerotic heart disease of native coronary artery without angina pectoris: Secondary | ICD-10-CM | POA: Insufficient documentation

## 2014-02-22 DIAGNOSIS — J4489 Other specified chronic obstructive pulmonary disease: Secondary | ICD-10-CM | POA: Insufficient documentation

## 2014-02-22 DIAGNOSIS — Z860100 Personal history of colon polyps, unspecified: Secondary | ICD-10-CM | POA: Insufficient documentation

## 2014-02-22 DIAGNOSIS — I120 Hypertensive chronic kidney disease with stage 5 chronic kidney disease or end stage renal disease: Secondary | ICD-10-CM | POA: Diagnosis not present

## 2014-02-22 DIAGNOSIS — IMO0001 Reserved for inherently not codable concepts without codable children: Secondary | ICD-10-CM | POA: Diagnosis not present

## 2014-02-22 DIAGNOSIS — I359 Nonrheumatic aortic valve disorder, unspecified: Secondary | ICD-10-CM | POA: Diagnosis not present

## 2014-02-22 DIAGNOSIS — H9190 Unspecified hearing loss, unspecified ear: Secondary | ICD-10-CM | POA: Insufficient documentation

## 2014-02-22 DIAGNOSIS — G8918 Other acute postprocedural pain: Secondary | ICD-10-CM | POA: Diagnosis not present

## 2014-02-22 DIAGNOSIS — G5620 Lesion of ulnar nerve, unspecified upper limb: Secondary | ICD-10-CM | POA: Diagnosis present

## 2014-02-22 DIAGNOSIS — J449 Chronic obstructive pulmonary disease, unspecified: Secondary | ICD-10-CM | POA: Insufficient documentation

## 2014-02-22 DIAGNOSIS — K219 Gastro-esophageal reflux disease without esophagitis: Secondary | ICD-10-CM | POA: Insufficient documentation

## 2014-02-22 DIAGNOSIS — G609 Hereditary and idiopathic neuropathy, unspecified: Secondary | ICD-10-CM | POA: Insufficient documentation

## 2014-02-22 HISTORY — PX: ULNAR NERVE TRANSPOSITION: SHX2595

## 2014-02-22 LAB — GLUCOSE, CAPILLARY
Glucose-Capillary: 103 mg/dL — ABNORMAL HIGH (ref 70–99)
Glucose-Capillary: 97 mg/dL (ref 70–99)

## 2014-02-22 LAB — POCT I-STAT 4, (NA,K, GLUC, HGB,HCT)
Glucose, Bld: 106 mg/dL — ABNORMAL HIGH (ref 70–99)
HCT: 40 % (ref 39.0–52.0)
HEMOGLOBIN: 13.6 g/dL (ref 13.0–17.0)
POTASSIUM: 4.1 meq/L (ref 3.7–5.3)
SODIUM: 134 meq/L — AB (ref 137–147)

## 2014-02-22 SURGERY — ULNAR NERVE DECOMPRESSION/TRANSPOSITION
Anesthesia: Regional | Site: Elbow | Laterality: Left

## 2014-02-22 MED ORDER — VANCOMYCIN HCL IN DEXTROSE 1-5 GM/200ML-% IV SOLN
1000.0000 mg | INTRAVENOUS | Status: AC
  Administered 2014-02-22: 1000 mg via INTRAVENOUS
  Filled 2014-02-22: qty 200

## 2014-02-22 MED ORDER — FENTANYL CITRATE 0.05 MG/ML IJ SOLN
INTRAMUSCULAR | Status: DC | PRN
  Administered 2014-02-22: 75 ug via INTRAVENOUS

## 2014-02-22 MED ORDER — CHLORHEXIDINE GLUCONATE 4 % EX LIQD
60.0000 mL | Freq: Once | CUTANEOUS | Status: DC
  Filled 2014-02-22: qty 60

## 2014-02-22 MED ORDER — ROPIVACAINE HCL 5 MG/ML IJ SOLN
INTRAMUSCULAR | Status: DC | PRN
  Administered 2014-02-22: 14 mL via PERINEURAL

## 2014-02-22 MED ORDER — BUPIVACAINE HCL (PF) 0.25 % IJ SOLN: INTRAMUSCULAR | Status: DC | PRN

## 2014-02-22 MED ORDER — ARTIFICIAL TEARS OP OINT
TOPICAL_OINTMENT | OPHTHALMIC | Status: AC
  Filled 2014-02-22: qty 3.5

## 2014-02-22 MED ORDER — LIDOCAINE-EPINEPHRINE (PF) 1.5 %-1:200000 IJ SOLN
INTRAMUSCULAR | Status: DC | PRN
  Administered 2014-02-22: 6 mL via PERINEURAL

## 2014-02-22 MED ORDER — ONDANSETRON HCL 4 MG/2ML IJ SOLN
INTRAMUSCULAR | Status: AC
  Filled 2014-02-22: qty 2

## 2014-02-22 MED ORDER — LIDOCAINE-EPINEPHRINE (PF) 1 %-1:200000 IJ SOLN
INTRAMUSCULAR | Status: AC
  Filled 2014-02-22: qty 10

## 2014-02-22 MED ORDER — PROPOFOL 10 MG/ML IV BOLUS
INTRAVENOUS | Status: AC
  Filled 2014-02-22: qty 20

## 2014-02-22 MED ORDER — LIDOCAINE HCL (PF) 1 % IJ SOLN
INTRAMUSCULAR | Status: AC
  Filled 2014-02-22: qty 30

## 2014-02-22 MED ORDER — GLYCOPYRROLATE 0.2 MG/ML IJ SOLN
INTRAMUSCULAR | Status: AC
  Filled 2014-02-22: qty 2

## 2014-02-22 MED ORDER — LIDOCAINE-EPINEPHRINE (PF) 1 %-1:200000 IJ SOLN
INTRAMUSCULAR | Status: DC | PRN
  Administered 2014-02-22: 30 mL via INTRADERMAL

## 2014-02-22 MED ORDER — MIDAZOLAM HCL 2 MG/2ML IJ SOLN
INTRAMUSCULAR | Status: AC
  Filled 2014-02-22: qty 2

## 2014-02-22 MED ORDER — FENTANYL CITRATE 0.05 MG/ML IJ SOLN
INTRAMUSCULAR | Status: AC
  Filled 2014-02-22: qty 5

## 2014-02-22 MED ORDER — DOCUSATE SODIUM 100 MG PO CAPS: 100.0000 mg | ORAL_CAPSULE | Freq: Two times a day (BID) | ORAL | Status: DC

## 2014-02-22 MED ORDER — ROCURONIUM BROMIDE 50 MG/5ML IV SOLN
INTRAVENOUS | Status: AC
  Filled 2014-02-22: qty 1

## 2014-02-22 MED ORDER — SODIUM CHLORIDE 0.9 % IV SOLN
INTRAVENOUS | Status: DC | PRN
  Administered 2014-02-22: 13:00:00 via INTRAVENOUS

## 2014-02-22 MED ORDER — 0.9 % SODIUM CHLORIDE (POUR BTL) OPTIME
TOPICAL | Status: DC | PRN
  Administered 2014-02-22: 1000 mL

## 2014-02-22 MED ORDER — MIDAZOLAM HCL 5 MG/5ML IJ SOLN
INTRAMUSCULAR | Status: DC | PRN
  Administered 2014-02-22: 1.5 mg via INTRAVENOUS

## 2014-02-22 MED ORDER — BUPIVACAINE HCL (PF) 0.25 % IJ SOLN
INTRAMUSCULAR | Status: AC
  Filled 2014-02-22: qty 30

## 2014-02-22 MED ORDER — OXYCODONE-ACETAMINOPHEN 5-325 MG PO TABS: 1.0000 | ORAL_TABLET | ORAL | Status: DC | PRN

## 2014-02-22 MED ORDER — PROPOFOL INFUSION 10 MG/ML OPTIME
INTRAVENOUS | Status: DC | PRN
  Administered 2014-02-22: 50 ug/kg/min via INTRAVENOUS

## 2014-02-22 SURGICAL SUPPLY — 52 items
BANDAGE ELASTIC 3 VELCRO ST LF (GAUZE/BANDAGES/DRESSINGS) IMPLANT
BANDAGE ELASTIC 4 VELCRO ST LF (GAUZE/BANDAGES/DRESSINGS) IMPLANT
BNDG GAUZE ELAST 4 BULKY (GAUZE/BANDAGES/DRESSINGS) ×2 IMPLANT
CORDS BIPOLAR (ELECTRODE) ×3 IMPLANT
COVER SURGICAL LIGHT HANDLE (MISCELLANEOUS) ×3 IMPLANT
CUFF TOURNIQUET SINGLE 18IN (TOURNIQUET CUFF) IMPLANT
CUFF TOURNIQUET SINGLE 24IN (TOURNIQUET CUFF) IMPLANT
DRAIN PENROSE 1/4X12 LTX STRL (WOUND CARE) ×2 IMPLANT
DRAPE OEC MINIVIEW 54X84 (DRAPES) IMPLANT
DRAPE SURG 17X23 STRL (DRAPES) ×6 IMPLANT
DRSG ADAPTIC 3X8 NADH LF (GAUZE/BANDAGES/DRESSINGS) ×3 IMPLANT
GAUZE SPONGE 4X4 12PLY STRL (GAUZE/BANDAGES/DRESSINGS) ×2 IMPLANT
GLOVE BIOGEL PI IND STRL 6.5 (GLOVE) ×1 IMPLANT
GLOVE BIOGEL PI IND STRL 8.5 (GLOVE) ×1 IMPLANT
GLOVE BIOGEL PI INDICATOR 6.5 (GLOVE) ×2
GLOVE BIOGEL PI INDICATOR 8.5 (GLOVE) ×2
GLOVE SURG ORTHO 8.0 STRL STRW (GLOVE) ×3 IMPLANT
GOWN STRL REUS W/ TWL LRG LVL3 (GOWN DISPOSABLE) ×2 IMPLANT
GOWN STRL REUS W/ TWL XL LVL3 (GOWN DISPOSABLE) ×1 IMPLANT
GOWN STRL REUS W/TWL LRG LVL3 (GOWN DISPOSABLE) ×6
GOWN STRL REUS W/TWL XL LVL3 (GOWN DISPOSABLE) ×3
KIT BASIN OR (CUSTOM PROCEDURE TRAY) ×3 IMPLANT
KIT ROOM TURNOVER OR (KITS) ×3 IMPLANT
MANIFOLD NEPTUNE II (INSTRUMENTS) ×3 IMPLANT
NDL HYPO 25GX1X1/2 BEV (NEEDLE) IMPLANT
NEEDLE HYPO 25GX1X1/2 BEV (NEEDLE) IMPLANT
NS IRRIG 1000ML POUR BTL (IV SOLUTION) ×3 IMPLANT
PACK ORTHO EXTREMITY (CUSTOM PROCEDURE TRAY) ×3 IMPLANT
PAD ARMBOARD 7.5X6 YLW CONV (MISCELLANEOUS) ×6 IMPLANT
PAD CAST 4YDX4 CTTN HI CHSV (CAST SUPPLIES) IMPLANT
PADDING CAST COTTON 4X4 STRL (CAST SUPPLIES)
SOAP 2 % CHG 4 OZ (WOUND CARE) ×3 IMPLANT
SOLUTION BETADINE 4OZ (MISCELLANEOUS) ×3 IMPLANT
SPECIMEN JAR SMALL (MISCELLANEOUS) ×3 IMPLANT
SPLINT FIBERGLASS 3X35 (CAST SUPPLIES) ×2 IMPLANT
SPLINT PLASTER EXTRA FAST 3X15 (CAST SUPPLIES) ×2
SPLINT PLASTER GYPS XFAST 3X15 (CAST SUPPLIES) IMPLANT
SPONGE GAUZE 4X4 12PLY STER LF (GAUZE/BANDAGES/DRESSINGS) ×2 IMPLANT
SUCTION FRAZIER TIP 10 FR DISP (SUCTIONS) IMPLANT
SUT MERSILENE 4 0 P 3 (SUTURE) IMPLANT
SUT MNCRL AB 4-0 PS2 18 (SUTURE) ×2 IMPLANT
SUT PROLENE 3 0 PS 2 (SUTURE) ×2 IMPLANT
SUT PROLENE 4 0 PS 2 18 (SUTURE) ×3 IMPLANT
SUT VIC AB 2-0 CT1 27 (SUTURE) ×3
SUT VIC AB 2-0 CT1 TAPERPNT 27 (SUTURE) IMPLANT
SYR CONTROL 10ML LL (SYRINGE) IMPLANT
TOWEL OR 17X24 6PK STRL BLUE (TOWEL DISPOSABLE) ×3 IMPLANT
TOWEL OR 17X26 10 PK STRL BLUE (TOWEL DISPOSABLE) ×3 IMPLANT
TUBE CONNECTING 12'X1/4 (SUCTIONS)
TUBE CONNECTING 12X1/4 (SUCTIONS) IMPLANT
UNDERPAD 30X30 INCONTINENT (UNDERPADS AND DIAPERS) ×3 IMPLANT
WATER STERILE IRR 1000ML POUR (IV SOLUTION) ×3 IMPLANT

## 2014-02-22 NOTE — Discharge Instructions (Signed)
KEEP BANDAGE CLEAN AND DRY CALL OFFICE FOR F/U APPT [phone] IN 14 DAYS DR Caralyn Guile CELL [phone] KEEP HAND ELEVATED ABOVE HEART OK TO APPLY ICE TO OPERATIVE AREA CONTACT OFFICE IF ANY WORSENING PAIN OR CONCERNS.  What to eat:  For your first meals, you should eat lightly; only small meals initially.  If you do not have nausea, you may eat larger meals.  Avoid spicy, greasy and heavy food.    General Anesthesia, Adult, Care After  Refer to this sheet in the next few weeks. These instructions provide you with information on caring for yourself after your procedure. Your health care provider may also give you more specific instructions. Your treatment has been planned according to current medical practices, but problems sometimes occur. Call your health care provider if you have any problems or questions after your procedure.  WHAT TO EXPECT AFTER THE PROCEDURE  After the procedure, it is typical to experience:  Sleepiness.  Nausea and vomiting. HOME CARE INSTRUCTIONS  For the first 24 hours after general anesthesia:  Have a responsible person with you.  Do not drive a car. If you are alone, do not take public transportation.  Do not drink alcohol.  Do not take medicine that has not been prescribed by your health care provider.  Do not sign important papers or make important decisions.  You may resume a normal diet and activities as directed by your health care provider.  Change bandages (dressings) as directed.  If you have questions or problems that seem related to general anesthesia, call the hospital and ask for the anesthetist or anesthesiologist on call. SEEK MEDICAL CARE IF:  You have nausea and vomiting that continue the day after anesthesia.  You develop a rash. SEEK IMMEDIATE MEDICAL CARE IF:  You have difficulty breathing.  You have chest pain.  You have any allergic problems. Document Released: 09/14/2000 Document Revised: 02/08/2013 Document Reviewed: 12/22/2012    [facility] Patient Information 2014 [place], [place].

## 2014-02-22 NOTE — Transfer of Care (Signed)
Immediate Anesthesia Transfer of Care Note  Patient: Victor Lane  Procedure(s) Performed: Procedure(s): LEFT ULNAR NERVE RELEASE AND OR TRANSPOSITION (Left)  Patient Location: PACU  Anesthesia Type:MAC combined with regional for post-op pain  Level of Consciousness: awake, alert  and oriented  Airway & Oxygen Therapy: Patient Spontanous Breathing and Patient connected to face mask oxygen  Post-op Assessment: Report given to PACU RN  Post vital signs: Reviewed and stable  Complications: No apparent anesthesia complications

## 2014-02-22 NOTE — H&P (Signed)
Victor Lane is an 68 y.o. male.   Chief Complaint: LEFT HAND NUMBNESS AND PAIN HPI: LEFT HAND NUMBNESS AND PAIN +EMG/NCV CONSISTENT WITH ULNAR NERVE NEUROPATHY PT HERE FOR SURGERY NO PRIOR SURGERY TO MEDIAL ASPECT OF RIGHT ELBOW   Past Medical History  Diagnosis Date  . Chest pain     non-ischemic stress test 02/2011 (Dr. Radford Pax)  . Carotid artery occlusion     s/p CEA  . Neuropathy   . Arthritis   . H/O vitamin D deficiency   . Hearing loss   . Hypercholesteremia   . 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 S/P radiation therapy 01/24/2013-03/14/2013    Larynx/glottis / 63 Gy in 28 fractions  . Hypertension   . GERD (gastroesophageal reflux disease)   . Complication of anesthesia     hard to wake up and then panics waking up  . Sleep apnea     to request from Dr.Fried was done about 28yrs ago but no cpap d/t weight loss  . Fibromyalgia     takes tramadol daily as needed  . Myalgia and myositis   . Peripheral neuropathy   . History of gout   . History of colon polyps   . ESRD on dialysis     - Stage 5- follwed by Dr Sharlotte Alamo in [place]  . Diabetes mellitus   . Cataracts, bilateral   . COPD (chronic obstructive pulmonary disease)   . Dilated aortic root   . Aortic valve sclerosis   . Diastolic dysfunction   . Coronary artery disease     Past Surgical History  Procedure Laterality Date  . Cholecystectomy  1993  . Rotator cuff repair  07/06/2011    right  . Carotid endarterectomy Left   . Coolonoscopy    . Colonoscopy w/ biopsies and polypectomy      Hx: of  . Microlaryngoscopy with laser N/A 01/04/2013    Procedure: MICROLARYNGOSCOPY WITH BIOPSY/LASER;  Surgeon: [name], MD;  Location: [place];  Service: ENT;  Laterality: N/A;  . Av fistula placement Left 02/23/2013    Procedure: ARTERIOVENOUS (AV) FISTULA CREATION;  Surgeon: Conrad Pamplico, MD;  Location: [place];  Service: Vascular;  Laterality: Left;   . Capd insertion N/A 08/01/2013    Procedure: LAPAROSCOPIC INSERTION CONTINUOUS AMBULATORY PERITONEAL DIALYSIS CATHETER;  Surgeon: [name], MD;  Location: [place];  Service: General;  Laterality: N/A;  . Laparoscopic abdominal exploration N/A 08/01/2013    Procedure: LAPAROSCOPIC ABDOMINAL EXPLORATION;  Surgeon: [name], MD;  Location: [facility] OR;  Service: General;  Laterality: N/A;    Family History  Problem Relation Age of Onset  . Adopted: Yes   Social History:  reports that he quit smoking about 18 years ago. His smoking use included Cigarettes. He has a 62 pack-year smoking history. He has never used smokeless tobacco. He reports that he does not drink alcohol or use illicit drugs.  Allergies:  Allergies  Allergen Reactions  . Amoxicillin     Causes upset stomach  . Fentanyl     Confusion when patch applied  . Moxifloxacin Other (See Comments)    unknown  . Nsaids Other (See Comments)    Severe kidney disease/ESRD    Medications Prior to Admission  Medication Sig Dispense Refill  . ALPRAZolam (XANAX) 0.5 MG tablet Take 0.5 mg by mouth at bedtime.       Marland Kitchen  aspirin 81 MG tablet Take 81 mg by mouth daily.       . calcium acetate (PHOSLO) 667 MG capsule Take 1,334 mg by mouth 3 (three) times daily with meals.      Marland Kitchen glipiZIDE (GLUCOTROL XL) 2.5 MG 24 hr tablet Take 2.5 mg by mouth daily as needed (diabetes).      . Melatonin 3 MG CAPS Take 3 mg by mouth at bedtime.      . metoprolol tartrate (LOPRESSOR) 25 MG tablet Take 0.5 tablets (12.5 mg total) by mouth 2 (two) times daily.      . multivitamin (RENA-VIT) TABS tablet Take 1 tablet by mouth daily.      Marland Kitchen omeprazole (PRILOSEC) 20 MG capsule Take 20 mg by mouth daily.      . traMADol (ULTRAM) 50 MG tablet Take 1-2 tablets (50-100 mg total) by mouth every 6 (six) hours as needed for moderate pain or severe pain.  40 tablet  0  . triamcinolone cream (KENALOG) 0.1 % Apply 1 application topically 2 (two) times daily. Apply  to knee and lower leg.        Results for orders placed during the hospital encounter of 02/22/14 (from the past 48 hour(s))  GLUCOSE, CAPILLARY     Status: Abnormal   Collection Time    02/22/14 11:29 AM      Result Value Ref Range   Glucose-Capillary 103 (*) 70 - 99 mg/dL  POCT I-STAT 4, (NA,K, GLUC, HGB,HCT)     Status: Abnormal   Collection Time    02/22/14 12:00 PM      Result Value Ref Range   Sodium 134 (*) 137 - 147 mEq/L   Potassium 4.1  3.7 - 5.3 mEq/L   Glucose, Bld 106 (*) 70 - 99 mg/dL   HCT 40.0  39.0 - 52.0 %   Hemoglobin 13.6  13.0 - 17.0 g/dL   Dg Chest 2 View  02/22/2014   CLINICAL DATA:  Preoperative exam prior to surgery  EXAM: CHEST  2 VIEW  COMPARISON:  PA and lateral chest of March 31, 2014  FINDINGS: The lungs are adequately inflated. The pulmonary vascularity is normal today. The interstitial markings are mildly prominent but much improved over the previous study. The cardiac silhouette is normal in size. There is no pleural effusion. The bony thorax is unremarkable.  IMPRESSION: There is no evidence of acute cardiopulmonary abnormality. Previously demonstrated interstitial edema likely secondary to CHF has cleared. Minimal bilateral pulmonary interstitial prominence persists which is likely the patient's baseline.   Electronically Signed   By: David  Martinique   On: 02/22/2014 12:24    ROS NO RECENT ILLNESSES OR HOSPITALIZATIONS  Blood pressure 158/70, pulse 82, temperature 97.8 F (36.6 C), temperature source Oral, resp. rate 20, height 5\' 10"  (1.778 m), weight 73.936 kg (163 lb), SpO2 100.00%. Physical Exam  General Appearance:  Alert, cooperative, no distress, appears stated age  Head:  Normocephalic, without obvious abnormality, atraumatic  Eyes:  Pupils equal, conjunctiva/corneas clear,         Throat: Lips, mucosa, and tongue normal; teeth and gums normal  Neck: No visible masses     Lungs:   respirations unlabored  Chest Wall:  No tenderness or  deformity  Heart:  Regular rate and rhythm,  Abdomen:   Soft, non-tender,         Extremities: LEFT ARM: FISTUALA OVER ANTECUBITAL REGION FINGERS WARM WELL PERFUSED GOOD FINGER FLEXION, WEAK HAND INTRINSICS  Pulses: 2+ and  symmetric  Skin: Skin color, texture, turgor normal, no rashes or lesions     Neurologic: Normal    Assessment/Plan LEFT ELBOW ULNAR NERVE NEUROPATHY  LEFT ELBOW ULNAR NERVE DECOMPRESSION AND OR TRANSPOSITION  R/B/A DISCUSSED WITH PT IN OFFICE.  PT VOICED UNDERSTANDING OF PLAN CONSENT SIGNED DAY OF SURGERY PT SEEN AND EXAMINED PRIOR TO OPERATIVE PROCEDURE/DAY OF SURGERY SITE MARKED. QUESTIONS ANSWERED WILL GO HOME FOLLOWING SURGERY  WE ARE PLANNING SURGERY FOR YOUR UPPER EXTREMITY. THE RISKS AND BENEFITS OF SURGERY INCLUDE BUT NOT LIMITED TO BLEEDING INFECTION, DAMAGE TO NEARBY NERVES ARTERIES TENDONS, FAILURE OF SURGERY TO ACCOMPLISH ITS INTENDED GOALS, PERSISTENT SYMPTOMS AND NEED FOR FURTHER SURGICAL INTERVENTION. WITH THIS IN MIND WE WILL PROCEED. I HAVE DISCUSSED WITH THE PATIENT THE PRE AND POSTOPERATIVE REGIMEN AND THE DOS AND DON'TS. PT VOICED UNDERSTANDING AND INFORMED CONSENT SIGNED.  [name] 02/22/2014, 1:27 PM

## 2014-02-22 NOTE — Anesthesia Procedure Notes (Addendum)
Anesthesia Regional Block:  Supraclavicular block  Pre-Anesthetic Checklist: ,, timeout performed, Correct Patient, Correct Site, Correct Laterality, Correct Procedure, Correct Position, site marked, Risks and benefits discussed,  Surgical consent,  Pre-op evaluation,  At surgeon's request and post-op pain management  Laterality: Upper and Left  Prep: chloraprep       Needles:  Injection technique: Single-shot  Needle Type: Echogenic Needle          Additional Needles:  Procedures: ultrasound guided (picture in chart) Supraclavicular block Narrative:  Start time: 02/22/2014 12:44 PM End time: 02/22/2014 12:51 PM Injection made incrementally with aspirations every 5 mL.  Performed by: Personally  Anesthesiologist: Ramyah Pankowski  Additional Notes: H+P and labs reviewed, risks and benefits discussed with patient, procedure tolerated well without complications

## 2014-02-22 NOTE — Brief Op Note (Addendum)
02/22/2014  1:30 PM  PATIENT:  Victor Lane  68 y.o. male  PRE-OPERATIVE DIAGNOSIS:  left elbow ulnar nerve neuropathy   POST-OPERATIVE DIAGNOSIS:  SAME  PROCEDURE:  Procedure(s): LEFT ULNAR NERVE RELEASE AND OR TRANSPOSITION (Left)  SURGEON:  Surgeon(s) and Role:    * [name], MD - Primary  PHYSICIAN ASSISTANT:   ASSISTANTS: none   ANESTHESIA:   regional  EBL:     BLOOD ADMINISTERED:none  DRAINS: none   LOCAL MEDICATIONS USED:  MARCAINE     SPECIMEN:  No Specimen  DISPOSITION OF SPECIMEN:  N/A  COUNTS:  YES  TOURNIQUET:    [id number]  PLAN OF CARE: Discharge to home after PACU  PATIENT DISPOSITION:  PACU - hemodynamically stable.   Delay start of Pharmacological VTE agent (>24hrs) due to surgical blood loss or risk of bleeding: not applicable

## 2014-02-22 NOTE — Anesthesia Postprocedure Evaluation (Signed)
  Anesthesia Post-op Note  Patient: Victor Lane  Procedure(s) Performed: Procedure(s): LEFT ULNAR NERVE RELEASE AND OR TRANSPOSITION (Left)  Patient Location: PACU  Anesthesia Type:MAC and Regional  Level of Consciousness: awake  Airway and Oxygen Therapy: Patient Spontanous Breathing  Post-op Pain: none  Post-op Assessment: Post-op Vital signs reviewed, Patient's Cardiovascular Status Stable, Respiratory Function Stable, Patent Airway, No signs of Nausea or vomiting and Pain level controlled  Post-op Vital Signs: Reviewed and stable  Last Vitals:  Filed Vitals:   02/22/14 1548  BP: 143/68  Pulse: 79  Temp:   Resp:     Complications: No apparent anesthesia complications

## 2014-02-22 NOTE — Anesthesia Preprocedure Evaluation (Addendum)
Anesthesia Evaluation  Patient identified by MRN, date of birth, ID band Patient awake    Reviewed: Allergy & Precautions, H&P , NPO status , Patient's Chart, lab work & pertinent test results, reviewed documented beta blocker date and time   History of Anesthesia Complications (+) Emergence Delirium and history of anesthetic complications  Airway Mallampati: II TM Distance: >3 FB Neck ROM: Full    Dental   Pulmonary sleep apnea , COPD COPD inhaler, former smoker,  breath sounds clear to auscultation- rhonchi        Cardiovascular hypertension, Pt. on medications and Pt. on home beta blockers + CAD, + Peripheral Vascular Disease and +CHF Rhythm:Regular     Neuro/Psych  Neuromuscular disease    GI/Hepatic GERD-  Medicated and Controlled,(+) Hepatitis -  Endo/Other  diabetes  Renal/GU Dialysis and ESRFRenal disease     Musculoskeletal  (+) Arthritis -, Fibromyalgia -  Abdominal   Peds  Hematology  (+) anemia ,   Anesthesia Other Findings   Reproductive/Obstetrics                         Anesthesia Physical Anesthesia Plan  ASA: IV  Anesthesia Plan: MAC and Regional   Post-op Pain Management: MAC Combined w/ Regional for Post-op pain   Induction: Intravenous  Airway Management Planned: Mask  Additional Equipment:   Intra-op Plan:   Post-operative Plan:   Informed Consent: I have reviewed the patients History and Physical, chart, labs and discussed the procedure including the risks, benefits and alternatives for the proposed anesthesia with the patient or authorized representative who has indicated his/her understanding and acceptance.   Dental advisory given  Plan Discussed with: CRNA, Anesthesiologist and Surgeon  Anesthesia Plan Comments:         Anesthesia Quick Evaluation                                  Anesthesia Evaluation  Patient identified by MRN, date of birth, ID  band Patient awake    Reviewed: Allergy & Precautions, H&P , NPO status , Patient's Chart, lab work & pertinent test results  History of Anesthesia Complications Negative for: history of anesthetic complications  Airway Mallampati: III TM Distance: >3 FB Neck ROM: Full    Dental  (+) Teeth Intact, Partial Upper and Dental Advisory Given   Pulmonary sleep apnea and Oxygen sleep apnea , COPDformer smoker,    Pulmonary exam normal       Cardiovascular hypertension, Pt. on medications - angina+ Past MI and + Peripheral Vascular Disease - CAD and - CHF Rhythm:Regular Rate:Normal     Neuro/Psych negative neurological ROS  negative psych ROS   GI/Hepatic GERD-  Medicated and Controlled,(+) Hepatitis -  Endo/Other  diabetes, Type 2, Oral Hypoglycemic Agents  Renal/GU CRF and DialysisRenal disease  negative genitourinary   Musculoskeletal   Abdominal   Peds  Hematology negative hematology ROS (+)   Anesthesia Other Findings   Reproductive/Obstetrics                        Anesthesia Physical Anesthesia Plan  ASA: III  Anesthesia Plan: General   Post-op Pain Management:    Induction: Intravenous  Airway Management Planned: Oral ETT and LMA  Additional Equipment: None  Intra-op Plan:   Post-operative Plan: Extubation in OR  Informed Consent: I have reviewed the patients History and  Physical, chart, labs and discussed the procedure including the risks, benefits and alternatives for the proposed anesthesia with the patient or authorized representative who has indicated his/her understanding and acceptance.   Dental advisory given  Plan Discussed with: CRNA and Surgeon  Anesthesia Plan Comments:         Anesthesia Quick Evaluation                                   Anesthesia Evaluation  Patient identified by MRN, date of birth, ID band Patient awake    Reviewed: Allergy & Precautions, H&P , NPO status , Patient's  Chart, lab work & pertinent test results, reviewed documented beta blocker date and time   History of Anesthesia Complications (+) Emergence Delirium  Airway Mallampati: II TM Distance: >3 FB Neck ROM: full    Dental   Pulmonary sleep apnea , pneumonia -, resolved,  breath sounds clear to auscultation  Pulmonary exam normal       Cardiovascular Exercise Tolerance: Good hypertension, Pt. on medications and On Home Beta Blockers + Peripheral Vascular Disease Rhythm:regular     Neuro/Psych PSYCHIATRIC DISORDERS Anxiety  Neuromuscular disease negative psych ROS   GI/Hepatic Neg liver ROS, GERD-  Medicated and Controlled,  Endo/Other  diabetes, Well Controlled, Type 2, Oral Hypoglycemic Agents  Renal/GU CRF and DialysisRenal disease  negative genitourinary   Musculoskeletal  (+) Fibromyalgia -  Abdominal Normal abdominal exam  (+)   Peds  Hematology negative hematology ROS (+) anemia ,   Anesthesia Other Findings See surgeon's H&P   Full upper bridge Lower partial bridge work  Reproductive/Obstetrics negative OB ROS                         Anesthesia Physical Anesthesia Plan  ASA: III  Anesthesia Plan: General and MAC   Post-op Pain Management:    Induction: Intravenous  Airway Management Planned: LMA and Simple Face Mask  Additional Equipment:   Intra-op Plan:   Post-operative Plan:   Informed Consent: I have reviewed the patients History and Physical, chart, labs and discussed the procedure including the risks, benefits and alternatives for the proposed anesthesia with the patient or authorized representative who has indicated his/her understanding and acceptance.   Dental Advisory Given  Plan Discussed with: CRNA and Surgeon  Anesthesia Plan Comments:         Anesthesia Quick Evaluation                                   Anesthesia Evaluation  Patient identified by MRN, date of birth, ID band Patient  awake    Reviewed: Allergy & Precautions, H&P , NPO status , Patient's Chart, lab work & pertinent test results, reviewed documented beta blocker date and time   History of Anesthesia Complications (+) Emergence Delirium  Airway Mallampati: II TM Distance: >3 FB Neck ROM: full    Dental   Pulmonary sleep apnea , pneumonia -, resolved,  breath sounds clear to auscultation  Pulmonary exam normal       Cardiovascular Exercise Tolerance: Good hypertension, Pt. on medications and On Home Beta Blockers + Peripheral Vascular Disease Rhythm:regular     Neuro/Psych PSYCHIATRIC DISORDERS Anxiety  Neuromuscular disease negative psych ROS   GI/Hepatic Neg liver ROS, GERD-  Medicated and Controlled,  Endo/Other  diabetes, Well  Controlled, Type 2, Oral Hypoglycemic Agents  Renal/GU CRF and DialysisRenal disease  negative genitourinary   Musculoskeletal  (+) Fibromyalgia -  Abdominal Normal abdominal exam  (+)   Peds  Hematology negative hematology ROS (+) anemia ,   Anesthesia Other Findings See surgeon's H&P   Full upper bridge Lower partial bridge work  Reproductive/Obstetrics negative OB ROS                         Anesthesia Physical Anesthesia Plan  ASA: III  Anesthesia Plan: General and MAC   Post-op Pain Management:    Induction: Intravenous  Airway Management Planned: LMA and Simple Face Mask  Additional Equipment:   Intra-op Plan:   Post-operative Plan:   Informed Consent: I have reviewed the patients History and Physical, chart, labs and discussed the procedure including the risks, benefits and alternatives for the proposed anesthesia with the patient or authorized representative who has indicated his/her understanding and acceptance.   Dental Advisory Given  Plan Discussed with: CRNA and Surgeon  Anesthesia Plan Comments:         Anesthesia Quick Evaluation

## 2014-02-22 NOTE — Progress Notes (Signed)
Orthopedic Tech Progress Note Patient Details:  Victor Lane October 01, 1945 [id number]  Ortho Devices Type of Ortho Device: Arm sling Ortho Device/Splint Location: LUE Ortho Device/Splint Interventions: Ordered;Application   Braulio Bosch 02/22/2014, 3:23 PM

## 2014-02-23 ENCOUNTER — Encounter (HOSPITAL_COMMUNITY): Payer: Self-pay | Admitting: Orthopedic Surgery

## 2014-02-23 ENCOUNTER — Ambulatory Visit: Payer: Medicare Other | Admitting: Cardiology

## 2014-02-23 NOTE — Op Note (Signed)
NAME:  GAYLE, COLLARD NO.:  [id number]  MEDICAL RECORD NO.:  [id number]  LOCATION:  [facility]                         FACILITY:  [facility]  PHYSICIAN:  Melrose Nakayama, MD  DATE OF BIRTH:  [birth year]  DATE OF PROCEDURE:  02/22/2014 DATE OF DISCHARGE:  02/22/2014                              OPERATIVE REPORT   PREOPERATIVE DIAGNOSIS:  Left elbow ulnar nerve decompression, severe.  POSTOPERATIVE DIAGNOSIS:  Left elbow ulnar nerve decompression, severe.  SURGEON:  [name] IV, MD, who scrubbed and present for the entire procedure.  ASSISTANT SURGEON:  None.  ANESTHESIA:  Supraclavicular block with local sedation, local anesthesia, and IV sedation.  PROCEDURE: 1. Left elbow ulnar nerve decompression. 2. Left elbow flexor pronator lengthening.  SURGICAL INDICATIONS:  Mr. Marling is a 68 year old gentleman with long- standing pain, numbness, and tingling in his ulnar 2 digits.  The patient had positive neurodiagnostic studies and elected to undergo the above procedure.  Risks, benefits, and alternatives were discussed in detail with the patient and a signed informed consent was obtained. Risks include, but not limited to bleeding, infection, damage to nearby nerves, arteries, or tendons, loss of motion of wrist and digits, incomplete relief of symptoms, and need for further surgical intervention.  DESCRIPTION OF PROCEDURE:  The patient was properly identified in the preoperative holding area and and marked with a permanent marker made on left forearm to indicate the correct operative site.  The patient was then brought back to the operating room, placed supine on the anesthesia table where the IV sedation was administered.  The patient tolerated this well.  A well-padded tourniquet was was then placed on the left upper extremity and left upper extremity was sealed with 1000 drape. Left upper extremity was then prepped and draped in normal sterile fashion.   Time-out was called, the correct side was identified, and the procedure was then began.  Attention was then turned to left elbow. Curvilinear incision was made directly over the posterior medial aspect of the elbow and 10 mL of 1% Xylocaine with epinephrine was injected locally.  Dissection was then carried out of the skin and subcutaneous tissue.  The ulnar nerve was then carefully identified and released proximally.  The ulnar nerve was then released __________ the intermuscular septum was then released.  Dissection was then carried down through the cubital tunnel through [facility] ligament __________ the nerve was then carefully released in its entirety.  The nerve was then placed through a full range of motion.  Unfortunately, the nerve did subluxate anteriorly.  Following this, a small Penrose was placed around the nerve.  The nerve was then freed both proximally and distally.  The vein was transposed anteriorly.  Flexor pronator was then lengthened in a Z-fashion creating a small 2 x 2 fascial sling.  After lengthening the flexor pronator, __________ transposed anteriorly.  The fascial sling was then __________ into the subcutaneous tissues anteriorly with a 2-0 Vicryl suture.  Once this was carried out, the nerve was then placed through full range of motion.  There was no evidence of compression or any significant kinking of the nerve.  Following this, the cubital tunnel was  then closed with 2-0 Vicryl suture.  The wound was then thoroughly irrigated.  The subcutaneous tissues was closed with Monocryl and the skin was then closed with 3-0 Prolene sutures.  Adaptic dressing and sterile compressive bandage then applied.  The patient tolerated the procedure well.  The patient was then placed in a long-arm splint and taken to recovery room in good condition.  POSTPROCEDURE PLAN:  The patient discharged home to be seen back in the office in approximately 10 days for wound check, suture  removal, and begin a postoperative ulnar nerve anterior transposition protocol.     Melrose Nakayama, MD     FWO/MEDQ  D:  02/22/2014  T:  02/22/2014  Job:  [id number]

## 2014-03-27 ENCOUNTER — Ambulatory Visit: Payer: Medicare Other | Admitting: Cardiology

## 2014-04-17 ENCOUNTER — Ambulatory Visit: Payer: Medicare Other | Admitting: Nurse Practitioner

## 2014-05-11 ENCOUNTER — Encounter: Payer: Self-pay | Admitting: Cardiology

## 2014-05-11 ENCOUNTER — Ambulatory Visit (INDEPENDENT_AMBULATORY_CARE_PROVIDER_SITE_OTHER): Payer: Medicare Other | Admitting: Cardiology

## 2014-05-11 VITALS — BP 138/60 | HR 76 | Ht 70.0 in | Wt 173.0 lb

## 2014-05-11 DIAGNOSIS — I6529 Occlusion and stenosis of unspecified carotid artery: Secondary | ICD-10-CM

## 2014-05-11 DIAGNOSIS — I10 Essential (primary) hypertension: Secondary | ICD-10-CM

## 2014-05-11 DIAGNOSIS — I6523 Occlusion and stenosis of bilateral carotid arteries: Secondary | ICD-10-CM

## 2014-05-11 DIAGNOSIS — I4892 Unspecified atrial flutter: Secondary | ICD-10-CM

## 2014-05-11 DIAGNOSIS — I2510 Atherosclerotic heart disease of native coronary artery without angina pectoris: Secondary | ICD-10-CM

## 2014-05-11 DIAGNOSIS — I77810 Thoracic aortic ectasia: Secondary | ICD-10-CM

## 2014-05-11 DIAGNOSIS — I255 Ischemic cardiomyopathy: Secondary | ICD-10-CM

## 2014-05-11 DIAGNOSIS — I38 Endocarditis, valve unspecified: Secondary | ICD-10-CM

## 2014-05-11 DIAGNOSIS — I351 Nonrheumatic aortic (valve) insufficiency: Secondary | ICD-10-CM | POA: Insufficient documentation

## 2014-05-11 DIAGNOSIS — I5042 Chronic combined systolic (congestive) and diastolic (congestive) heart failure: Secondary | ICD-10-CM

## 2014-05-11 NOTE — Patient Instructions (Signed)
Your physician has requested that you have an echocardiogram in February 2016. Echocardiography is a painless test that uses sound waves to create images of your heart. It provides your doctor with information about the size and shape of your heart and how well your heart's chambers and valves are working. This procedure takes approximately one hour. There are no restrictions for this procedure.  Your physician has requested that you have a carotid duplex. This test is an ultrasound of the carotid arteries in your neck. It looks at blood flow through these arteries that supply the brain with blood. Allow one hour for this exam. There are no restrictions or special instructions.  Your physician recommends that you have FASTING LABS (lipids, ALT) at your PCP.  Your physician recommends that you schedule a follow-up appointment in: 3 months with Dr. Radford Pax.

## 2014-05-11 NOTE — Progress Notes (Signed)
[address], [place] [place], [place]  [address] Phone: [phone] Fax:  [phone]  Date:  05/11/2014   ID:  Victor Lane, DOB 1946-04-02, MRN [id number]  PCP:  [name], MD  Cardiologist:  Fransico Him, MD    History of Present Illness: 68 year old with extensive PMH including ESRD/stage 4 CKD with HD on M/W/F, w/ PD catheter placement on 08/01/13 . Presented to ER on 2/15 am with fever, abd pain , weakness. B/P marginal ~106, tachycardia. Lactate 13 . Felt to have SIRS/Sepsis . He also had acute on chronic systolic CHF. He ruled in for NSTEMI felt secondary to demand ischemia. 2D echo showed EF 40-45% and Lexiscan myoview showed reversible defects in the inferior, inferolateral and anteroseptal walls with EF 38%. He underwent cath revealing severe CAD with mid RCA occlusion, distal LAD stenosis, moderate disease of the left circ. Medical therapy was recommended. He also had Paroxysmal atrial flutter that spontaneously resolved after IV Cardizem. His CHADS VASC score is 4 but it was recommended that he not be anticoagulated due to weakness with frequent falls at home, especially since this was his first episode of PAflutter in the setting of acute influenza with respiratory failure. He is now on Peritoneal dialysis and is doing well with that. He denies any chest pain.He occasionally has some DOE depending on how much removal he does with PD.   He says his LE edema has resolved. He denies any palpitations or dizziness.    Wt Readings from Last 3 Encounters:  05/11/14 173 lb (78.472 kg)  02/22/14 163 lb (73.936 kg)  02/14/14 163 lb 11.2 oz (74.254 kg)     Past Medical History  Diagnosis Date  . Chest pain     non-ischemic stress test 02/2011 (Dr. Radford Pax)  . Carotid artery occlusion     s/p CEA  . Neuropathy   . Arthritis   . H/O vitamin D deficiency   . Hearing loss   . Hypercholesteremia   . 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 S/P radiation therapy 01/24/2013-03/14/2013    Larynx/glottis / 63 Gy in 28 fractions  . Hypertension   . GERD (gastroesophageal reflux disease)   . Complication of anesthesia     hard to wake up and then panics waking up  . Sleep apnea     to request from Dr.Fried was done about 59yrs ago but no cpap d/t weight loss  . Fibromyalgia     takes tramadol daily as needed  . Myalgia and myositis   . Peripheral neuropathy   . History of gout   . History of colon polyps   . ESRD on dialysis     - Stage 5- follwed by Dr Sharlotte Alamo in [place]  . Diabetes mellitus   . Cataracts, bilateral   . COPD (chronic obstructive pulmonary disease)   . Dilated aortic root   . Aortic valve sclerosis   . Diastolic dysfunction   . Coronary artery disease     severe CAD with mid RCA occlusion, distal LAD stenosis, moderate disease of the left circ. Medical therapy was recommended.  . Aortic regurgitation     Current Outpatient Prescriptions  Medication Sig Dispense Refill  . ALPRAZolam (XANAX) 0.5 MG tablet Take 0.5 mg by mouth at bedtime.     Marland Kitchen amLODipine (NORVASC) 5 MG tablet Take 5 mg by mouth daily.     Marland Kitchen  aspirin 81 MG tablet Take 81 mg by mouth daily.     . calcium acetate (PHOSLO) 667 MG capsule Take 1,334 mg by mouth 3 (three) times daily with meals.    . clindamycin (CLEOCIN) 300 MG capsule Take 300 mg by mouth 3 (three) times daily.   0  . glipiZIDE (GLUCOTROL XL) 2.5 MG 24 hr tablet Take 2.5 mg by mouth daily as needed (diabetes).    . metoprolol tartrate (LOPRESSOR) 25 MG tablet Take 0.5 tablets (12.5 mg total) by mouth 2 (two) times daily.    . multivitamin (RENA-VIT) TABS tablet Take 1 tablet by mouth daily.    Marland Kitchen omeprazole (PRILOSEC) 20 MG capsule Take 20 mg by mouth daily.    . traMADol (ULTRAM) 50 MG tablet Take 1-2 tablets (50-100 mg total) by mouth every 6 (six) hours as needed for moderate pain or severe pain. 40 tablet 0  . triamcinolone cream  (KENALOG) 0.1 % Apply 1 application topically 2 (two) times daily. Apply to knee and lower leg.     No current facility-administered medications for this visit.    Allergies:    Allergies  Allergen Reactions  . Amoxicillin     Causes upset stomach  . Fentanyl     Confusion when patch applied  . Moxifloxacin Other (See Comments)    unknown  . Nsaids Other (See Comments)    Severe kidney disease/ESRD    Social History:  The patient  reports that he quit smoking about 18 years ago. His smoking use included Cigarettes. He has a 62 pack-year smoking history. He has never used smokeless tobacco. He reports that he does not drink alcohol or use illicit drugs.   Family History:  The patient's family history is not on file. He was adopted.   ROS:  Please see the history of present illness.      All other systems reviewed and negative.   PHYSICAL EXAM: VS:  BP 138/60 mmHg  Pulse 76  Ht 5\' 10"  (1.778 m)  Wt 173 lb (78.472 kg)  BMI 24.82 kg/m2 Well nourished, well developed, in no acute distress HEENT: normal Neck: no JVD Cardiac:  normal S1, S2; RRR; no murmur Lungs:  clear to auscultation bilaterally, no wheezing, rhonchi or rales Abd: soft, nontender, no hepatomegaly Ext: no edema Skin: warm and dry Neuro:  CNs 2-12 intact, no focal abnormalities noted  ASSESSMENT AND PLAN:  1.  Chronic combined systolic/diastolic CHF NYHA class I - well compensated. He is doing very well. - continue ASA/beta blocker 2.  Mild to moderate LV dysfunction EF 40-45% by recent echo - continue beta blocker 3.  Paroxysmal atrial flutter - one episode in the setting of acute influenza. His CHADS VASC score is 4. He was not sent home on anticoagulation due to increased weakness and falls at home. Event monitor did not show any further PAF.  Continue metoprolol.  4.  Moderate AI - repeat echo 07/2014 5.  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  HTN well controlled.  Continue  amlodipine/metoprolol 7.  Carotid artery stenosis with bilateral bruits - I will repeat carotid dopplers.  It has been 2 years since last study.   8.  Dyslipidemia - he is not on statin therapy at this time.  I will get FLP and ALT. 9.  Severe CAD with mid RCA occlusion, distal LAD stenosis, moderate disease of the left circ. Medical therapy was recommended.  He is not having any angina.  Followup with  me in 3 months      Signed, Fransico Him, MD [facility] 05/11/2014 11:33 AM

## 2014-05-21 ENCOUNTER — Encounter: Payer: Self-pay | Admitting: Cardiology

## 2014-05-25 ENCOUNTER — Ambulatory Visit: Payer: Medicare Other | Admitting: Radiation Oncology

## 2014-05-25 ENCOUNTER — Telehealth: Payer: Self-pay | Admitting: Cardiology

## 2014-05-25 ENCOUNTER — Ambulatory Visit: Payer: Medicare Other | Attending: Radiation Oncology

## 2014-05-25 NOTE — Telephone Encounter (Signed)
Please refer patient to lipid clinic

## 2014-05-25 NOTE — Telephone Encounter (Signed)
Follow up  ° ° °Patient returning call back to nurse  °

## 2014-05-25 NOTE — Telephone Encounter (Signed)
Patient is willing to talk to lipid clinic and see what his options are. Will forward to Oretta Pate RN

## 2014-05-25 NOTE — Telephone Encounter (Signed)
Returned patient's call. Patient told, per Dr. Theodosia Blender note below, that his LDL is too high, and to start Lipitor 40 mg daily. Patient stated "I can't do that. I am having dialysis three times a week right now and I am not going to start any new medications. I am not able to take Lipitor. It doesn't work." Informed patient that I would let Dr. Radford Pax know what he said, and will call him with any other changes.  Ewell Poe RN  Notes Recorded by Sueanne Margarita, MD on 05/22/2014 at 11:34 PM LDL too high - please start Lipitor 40mg  daily and recheck FLP and ALT in 6 weeks

## 2014-05-28 NOTE — Telephone Encounter (Signed)
Spoke with pt.  He is dealing with infection in his abdomen related to peritoneal dialysis and would like to wait until this is cleared up before starting any new medications.  He states he took 1 dose of Lipitor and had visual changes and so does not want to take it again.  I have made him an appt in the [facility] for January to discuss options once his infection has cleared.

## 2014-05-31 ENCOUNTER — Telehealth: Payer: Self-pay | Admitting: *Deleted

## 2014-05-31 ENCOUNTER — Encounter (HOSPITAL_COMMUNITY): Payer: Self-pay | Admitting: Interventional Cardiology

## 2014-05-31 NOTE — Telephone Encounter (Signed)
CALLED PATIENT TO INFORM THAT HE NEEDS TO COME FOR LAB TOMORROW,PRIOR TO FU VISIT WITH DR. Isidore Moos @ 2:40 PM, SPOKE WITH PATIENT'S WIFE (PATRICIA) AND SHE IS AWARE OF THIS

## 2014-06-01 ENCOUNTER — Ambulatory Visit
Admission: RE | Admit: 2014-06-01 | Discharge: 2014-06-01 | Disposition: A | Discharge status: Home / Self Care | Payer: Medicare Other | Source: Ambulatory Visit | Attending: Radiation Oncology | Admitting: Radiation Oncology

## 2014-06-01 VITALS — BP 154/56 | HR 72 | Temp 98.0°F | Ht 70.0 in | Wt 176.7 lb

## 2014-06-01 DIAGNOSIS — R635 Abnormal weight gain: Secondary | ICD-10-CM | POA: Insufficient documentation

## 2014-06-01 DIAGNOSIS — Z923 Personal history of irradiation: Secondary | ICD-10-CM | POA: Diagnosis not present

## 2014-06-01 DIAGNOSIS — C320 Malignant neoplasm of glottis: Secondary | ICD-10-CM | POA: Diagnosis present

## 2014-06-01 DIAGNOSIS — Z992 Dependence on renal dialysis: Secondary | ICD-10-CM | POA: Insufficient documentation

## 2014-06-01 DIAGNOSIS — Z79899 Other long term (current) drug therapy: Secondary | ICD-10-CM | POA: Insufficient documentation

## 2014-06-01 DIAGNOSIS — Z7982 Long term (current) use of aspirin: Secondary | ICD-10-CM | POA: Insufficient documentation

## 2014-06-01 DIAGNOSIS — E039 Hypothyroidism, unspecified: Secondary | ICD-10-CM

## 2014-06-01 LAB — TSH CHCC: TSH: 1.643 m(IU)/L (ref 0.320–4.118)

## 2014-06-01 MED ORDER — LARYNGOSCOPY SOLUTION RAD-ONC
15.0000 mL | Freq: Once | TOPICAL | Status: AC
  Administered 2014-06-01: 15 mL via TOPICAL
  Filled 2014-06-01: qty 15

## 2014-06-01 NOTE — Progress Notes (Signed)
Victor Lane here for reassessment.  He denies any pain nor difficulty swallwing and has to chew weel due to poor dentition.  Her has gained another 3 lbs since his last visit.  Being seen by Dr. Louie Boston for peritoneal dialysis.

## 2014-06-01 NOTE — Progress Notes (Signed)
.  Radiation Oncology         [phone]) [phone] ________________________________  Name: Victor Lane MRN: [id number]  Date: 06/01/2014  DOB: 1945-08-22  Follow-Up Visit Note  Oupatient  REFERRING: [name] MD  CC: [name], MD  [name], MD   [facility]    ICD-9-CM ICD-10-CM   1. 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     He denies any pain nor difficulty swallowing. He has gained another 3 lbs since his last visit.  Being seen by Dr. Louie Boston for peritoneal dialysis. This is going well.                                 ALLERGIES:  is allergic to amoxicillin; fentanyl; moxifloxacin; and nsaids.  Meds: Current Outpatient Prescriptions  Medication Sig Dispense Refill  . acetaminophen (TYLENOL) 500 MG tablet Take 500 mg by mouth every 6 (six) hours as needed.    Marland Kitchen amLODipine (NORVASC) 10 MG tablet Take 10 mg by mouth daily.    . calcium acetate (PHOSLO) 667 MG capsule Take 1,334 mg by mouth 3 (three) times daily with meals.    Marland Kitchen glipiZIDE (GLUCOTROL XL) 2.5 MG 24 hr tablet Take 2.5 mg by mouth daily as needed (diabetes).    Marland Kitchen ipratropium (ATROVENT) 0.06 % nasal spray Place 2 sprays into both nostrils as needed for rhinitis.    . metoprolol succinate (TOPROL-XL) 50 MG 24 hr tablet Take 50 mg by mouth daily. Take with or immediately following a meal.    . multivitamin (RENA-VIT) TABS tablet Take 1 tablet by mouth daily.    . mupirocin cream (BACTROBAN) 2 % Apply 1 application topically.    . traMADol (ULTRAM) 50 MG tablet Take 1-2 tablets (50-100 mg total) by mouth every 6 (six) hours as needed for moderate pain or severe pain. 40 tablet 0  . triamcinolone cream  (KENALOG) 0.1 % Apply 1 application topically 2 (two) times daily. Apply to knee and lower leg.    Marland Kitchen ALPRAZolam (XANAX) 0.5 MG tablet Take 0.5 mg by mouth at bedtime.     Marland Kitchen aspirin 81 MG tablet Take 81 mg by mouth daily.     Marland Kitchen omeprazole (PRILOSEC) 20 MG capsule Take 20 mg by mouth daily.     No current facility-administered medications for this encounter.    Physical Findings: The patient is in no acute distress. Patient is alert and oriented.  height is 5\' 10"  (1.778 m) and weight is 176 lb 11.2 oz (80.151 kg). His temperature is 98 F (36.7 C). His blood pressure is 154/56 and his pulse is 72. Marland Kitchen    Oropharynx- mucous membranes moist, no lesions. Neck - no cervical or SCV adenopathy.  Skin healthy, intact.   PROCEDURE NOTE: After anesthetizing the nasal cavity with topical lidocaine and phenylephrine, the flexible endoscope was introduced and passed through the nasal cavity. No pharyngeal or laryngeal lesions appreciated.  Vocal cords symmetrically mobile   Lab Findings: Lab Results  Component Value Date   WBC 6.8 08/30/2013   HGB 13.6 02/22/2014   HCT 40.0 02/22/2014   MCV 93.7 08/30/2013   PLT 206 08/30/2013  Lab Results  Component Value Date   TSH 1.643 06/01/2014    Radiographic Findings: No results found.  Impression/Plan:  1) Head and Neck Cancer Status: NED   2) Nutritional Status: no active issues related to cancer diagnosis; defer any dietary recommendations to nephrology - PEG tube: removed   3) Risk Factors: The patient has been educated about risk factors including alcohol and tobacco abuse; they understand that avoidance of alcohol and tobacco is important to prevent recurrences as well as other cancers. Hasn't smoked for about 2 decades.   4) Swallowing: no new issues  5) Energy: improving. TSH is normal as of today  6) Social: No active social issues to address at this time.  Doing well with peritoneal dialysis which has improved his QOL.   8) Other:  Otolaryngology followup recommended w/in 4 months   9) Follow-up with me in 8 months with TSH. No imaging needed per NCCN guidelines unless new sx/sx develop and warrant this.  The patient was encouraged to call with any issues or questions before then. _____________________________________   Eppie Gibson, MD

## 2014-06-04 ENCOUNTER — Telehealth: Payer: Self-pay | Admitting: *Deleted

## 2014-06-04 NOTE — Telephone Encounter (Signed)
CALLED PATIENT TO INFORM OF LAB ON 01-25-15 @ 10:30 AM , SPOKE WITH PATIENT'S WIFE, PATRICIA AND SHE IS AWARE OF THIS APPT.

## 2014-07-03 ENCOUNTER — Ambulatory Visit: Payer: Medicare Other | Admitting: Pharmacist

## 2014-07-24 ENCOUNTER — Ambulatory Visit (HOSPITAL_BASED_OUTPATIENT_CLINIC_OR_DEPARTMENT_OTHER): Payer: Medicare Other

## 2014-07-24 ENCOUNTER — Ambulatory Visit (HOSPITAL_COMMUNITY): Payer: Medicare Other | Attending: Cardiology | Admitting: Cardiology

## 2014-07-24 DIAGNOSIS — I38 Endocarditis, valve unspecified: Secondary | ICD-10-CM | POA: Diagnosis not present

## 2014-07-24 DIAGNOSIS — J449 Chronic obstructive pulmonary disease, unspecified: Secondary | ICD-10-CM | POA: Insufficient documentation

## 2014-07-24 DIAGNOSIS — I6523 Occlusion and stenosis of bilateral carotid arteries: Secondary | ICD-10-CM

## 2014-07-24 DIAGNOSIS — Z87891 Personal history of nicotine dependence: Secondary | ICD-10-CM | POA: Diagnosis not present

## 2014-07-24 DIAGNOSIS — E119 Type 2 diabetes mellitus without complications: Secondary | ICD-10-CM | POA: Diagnosis not present

## 2014-07-24 DIAGNOSIS — I10 Essential (primary) hypertension: Secondary | ICD-10-CM | POA: Diagnosis not present

## 2014-07-24 DIAGNOSIS — I2510 Atherosclerotic heart disease of native coronary artery without angina pectoris: Secondary | ICD-10-CM | POA: Insufficient documentation

## 2014-07-24 DIAGNOSIS — E785 Hyperlipidemia, unspecified: Secondary | ICD-10-CM | POA: Diagnosis not present

## 2014-07-24 DIAGNOSIS — I351 Nonrheumatic aortic (valve) insufficiency: Secondary | ICD-10-CM

## 2014-07-24 NOTE — Progress Notes (Signed)
2D Echo completed. 07/24/2014

## 2014-07-24 NOTE — Progress Notes (Signed)
Carotid duplex performed  Results given to Dr.Turner.

## 2014-07-30 ENCOUNTER — Encounter: Payer: Self-pay | Admitting: Cardiology

## 2014-07-30 ENCOUNTER — Telehealth: Payer: Self-pay

## 2014-07-30 ENCOUNTER — Ambulatory Visit (INDEPENDENT_AMBULATORY_CARE_PROVIDER_SITE_OTHER): Payer: Medicare Other | Admitting: Cardiology

## 2014-07-30 VITALS — BP 122/60 | HR 83 | Ht 70.0 in | Wt 177.0 lb

## 2014-07-30 DIAGNOSIS — I10 Essential (primary) hypertension: Secondary | ICD-10-CM

## 2014-07-30 DIAGNOSIS — I351 Nonrheumatic aortic (valve) insufficiency: Secondary | ICD-10-CM

## 2014-07-30 DIAGNOSIS — I255 Ischemic cardiomyopathy: Secondary | ICD-10-CM

## 2014-07-30 DIAGNOSIS — E785 Hyperlipidemia, unspecified: Secondary | ICD-10-CM | POA: Insufficient documentation

## 2014-07-30 DIAGNOSIS — I5042 Chronic combined systolic (congestive) and diastolic (congestive) heart failure: Secondary | ICD-10-CM

## 2014-07-30 DIAGNOSIS — I4892 Unspecified atrial flutter: Secondary | ICD-10-CM

## 2014-07-30 DIAGNOSIS — I6523 Occlusion and stenosis of bilateral carotid arteries: Secondary | ICD-10-CM

## 2014-07-30 DIAGNOSIS — I2510 Atherosclerotic heart disease of native coronary artery without angina pectoris: Secondary | ICD-10-CM

## 2014-07-30 LAB — CBC WITH DIFFERENTIAL/PLATELET
Basophils Absolute: 0 10*3/uL (ref 0.0–0.1)
Basophils Relative: 0.4 % (ref 0.0–3.0)
EOS ABS: 0.3 10*3/uL (ref 0.0–0.7)
Eosinophils Relative: 3.6 % (ref 0.0–5.0)
HCT: 34.1 % — ABNORMAL LOW (ref 39.0–52.0)
HEMOGLOBIN: 11.6 g/dL — AB (ref 13.0–17.0)
LYMPHS PCT: 12.5 % (ref 12.0–46.0)
Lymphs Abs: 1 10*3/uL (ref 0.7–4.0)
MCHC: 33.9 g/dL (ref 30.0–36.0)
MCV: 98.3 fl (ref 78.0–100.0)
MONO ABS: 0.7 10*3/uL (ref 0.1–1.0)
MONOS PCT: 9 % (ref 3.0–12.0)
NEUTROS ABS: 6.1 10*3/uL (ref 1.4–7.7)
NEUTROS PCT: 74.5 % (ref 43.0–77.0)
Platelets: 300 10*3/uL (ref 150.0–400.0)
RBC: 3.47 Mil/uL — ABNORMAL LOW (ref 4.22–5.81)
RDW: 17.5 % — AB (ref 11.5–15.5)
WBC: 8.2 10*3/uL (ref 4.0–10.5)

## 2014-07-30 LAB — BASIC METABOLIC PANEL
BUN: 63 mg/dL — ABNORMAL HIGH (ref 6–23)
CALCIUM: 9.4 mg/dL (ref 8.4–10.5)
CO2: 27 mEq/L (ref 19–32)
CREATININE: 9.46 mg/dL — AB (ref 0.40–1.50)
Chloride: 94 mEq/L — ABNORMAL LOW (ref 96–112)
GFR: 5.9 mL/min — AB (ref >60.00)
Glucose, Bld: 95 mg/dL (ref 70–99)
POTASSIUM: 4.1 meq/L (ref 3.5–5.1)
SODIUM: 137 meq/L (ref 135–145)

## 2014-07-30 LAB — TSH: TSH: 2.29 u[IU]/mL (ref 0.35–4.50)

## 2014-07-30 LAB — BRAIN NATRIURETIC PEPTIDE: Pro B Natriuretic peptide (BNP): 1252 pg/mL — ABNORMAL HIGH (ref 0.0–100.0)

## 2014-07-30 MED ORDER — LOSARTAN POTASSIUM 25 MG PO TABS: 25.0000 mg | ORAL_TABLET | Freq: Every day | ORAL | Status: DC

## 2014-07-30 MED ORDER — LOSARTAN POTASSIUM 50 MG PO TABS: 50.0000 mg | ORAL_TABLET | Freq: Every day | ORAL | Status: DC

## 2014-07-30 MED ORDER — CARVEDILOL 6.25 MG PO TABS: 6.2500 mg | ORAL_TABLET | Freq: Two times a day (BID) | ORAL | Status: DC

## 2014-07-30 MED ORDER — CARVEDILOL 25 MG PO TABS: 25.0000 mg | ORAL_TABLET | Freq: Two times a day (BID) | ORAL | Status: DC

## 2014-07-30 NOTE — Patient Instructions (Addendum)
Your physician recommends that you return for lab work TODAY (BMET, CBC, TSH, BNP)  Your physician has recommended you make the following change in your medication:  1) STOP Metoprolol 2) STOP Atenolol 3) START Coreg 25mg , twice a day with food 4) START Losartan 50 mg daily   Your physician recommends that you schedule a follow-up appointment in: 1 week with Physician [facility]  Your physician has requested that you regularly monitor and record your blood pressure readings at home. Please use the same machine at the same time of day to check your readings and record them to bring to your follow-up visit. Call out office if you have consistent BP readings greater than 150/90.  You have been referred to Dr. Kellie Simmering, as soon as possible, for Carotid Stenosis greater than 80%

## 2014-07-30 NOTE — Progress Notes (Addendum)
Cardiology Office Note   Date:  07/30/2014   ID:  Victor Lane, Victor Lane Jul 26, 1945, MRN [id number]  PCP:  [name], MD  Cardiologist:   Sueanne Margarita, MD   Chief Complaint  Patient presents with  . Coronary Artery Disease  . Cardiomyopathy  . Hypertension  . 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 He also has paroxysmal atrial flutter. His CHADS VASC score is 4 but it was recommended that he not be anticoagulated due to weakness with frequent falls at home, especially since this was his first episode of PAflutter in the setting of acute influenza with respiratory failure. He is now on Peritoneal dialysis and is doing well with that. He recently had a 2D echo to followup on AI and his LVF has significantly declined and is now 20-25%.  He also was noted to have progression of his carotid artery stenosis now with >80% stenosis of the right carotid artery and has been referred to vascular surgery.  He has refused statin therapy in the past and was referred to lipid clinic in January but was not seen.  He denies any chest pain or pressure.He occasionally has some DOE depending on how much removal he does with PD and some with exertion. He says his LE edema has resolved. He denies any palpitations or dizziness.    Past Medical History  Diagnosis Date  . Neuropathy   . Arthritis   . H/O vitamin D deficiency   . Hearing loss   . Hypercholesteremia   . 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 S/P radiation therapy 01/24/2013-03/14/2013    Larynx/glottis / 63 Gy in 28 fractions  . Hypertension   . GERD (gastroesophageal reflux disease)   .  Complication of anesthesia     hard to wake up and then panics waking up  . Sleep apnea     to request from Dr.Fried was done about 13yrs ago but no cpap d/t weight loss  . Fibromyalgia     takes tramadol daily as needed  . Myalgia and myositis   . Peripheral neuropathy   . History of gout   . History of colon polyps   . ESRD on dialysis     - Stage 5- follwed by Dr Sharlotte Alamo in [place]  . Diabetes mellitus   . Cataracts, bilateral   . COPD (chronic obstructive pulmonary disease)   . Dilated aortic root   . Aortic valve sclerosis   . Diastolic dysfunction   . Coronary artery disease     severe CAD with mid RCA occlusion, distal LAD stenosis, moderate disease of the left circ. Medical therapy was recommended.  . Aortic regurgitation   . Carotid artery occlusion     s/p left CEA with 40-59% restenosis and >80% stenosis of the Right ICA - followed by Dr. Kellie Simmering    Past Surgical History  Procedure Laterality Date  . Cholecystectomy  1993  . Rotator cuff repair  07/06/2011    right  . Carotid endarterectomy Left   . Coolonoscopy    . Colonoscopy w/ biopsies and polypectomy      Hx: of  .  Microlaryngoscopy with laser N/A 01/04/2013    Procedure: MICROLARYNGOSCOPY WITH BIOPSY/LASER;  Surgeon: [name], MD;  Location: [place];  Service: ENT;  Laterality: N/A;  . Av fistula placement Left 02/23/2013    Procedure: ARTERIOVENOUS (AV) FISTULA CREATION;  Surgeon: Conrad Section, MD;  Location: [place];  Service: Vascular;  Laterality: Left;  . Capd insertion N/A 08/01/2013    Procedure: LAPAROSCOPIC INSERTION CONTINUOUS AMBULATORY PERITONEAL DIALYSIS CATHETER;  Surgeon: [name], MD;  Location: [place];  Service: General;  Laterality: N/A;  . Laparoscopic abdominal exploration N/A 08/01/2013    Procedure: LAPAROSCOPIC ABDOMINAL EXPLORATION;  Surgeon: [name], MD;  Location: [place];  Service: General;  Laterality: N/A;  . Ulnar nerve transposition Left 02/22/2014    Procedure: LEFT  ULNAR NERVE RELEASE AND OR TRANSPOSITION;  Surgeon: [name], MD;  Location: [facility];  Service: Orthopedics;  Laterality: Left;  . Left heart catheterization with coronary angiogram N/A 08/14/2013    Procedure: LEFT HEART CATHETERIZATION WITH CORONARY ANGIOGRAM;  Surgeon: [name], MD;  Location[facility] C[facility];  Service: Cardiovascular;  Laterality: N/A;     Current Outpatient Prescriptions  Medication Sig Dispense Refill  . acetaminophen (TYLENOL) 500 MG tablet Take 500 mg by mouth every 6 (six) hours as needed.    Marland Kitchen amLODipine (NORVASC) 10 MG tablet Take 10 mg by mouth daily.    Marland Kitchen aspirin 81 MG tablet Take 81 mg by mouth daily.     . calcium acetate (PHOSLO) 667 MG capsule Take 1,334 mg by mouth 3 (three) times daily with meals.    Marland Kitchen gentamicin cream (GARAMYCIN) 0.1 % Apply 1 application topically 2 (two) times daily.  99  . glipiZIDE (GLUCOTROL XL) 2.5 MG 24 hr tablet Take 2.5 mg by mouth daily as needed (diabetes).    Marland Kitchen ipratropium (ATROVENT) 0.06 % nasal spray Place 2 sprays into both nostrils as needed for rhinitis.    . metoprolol succinate (TOPROL-XL) 50 MG 24 hr tablet Take 50 mg by mouth daily. Take with or immediately following a meal.    . multivitamin (RENA-VIT) TABS tablet Take 1 tablet by mouth daily.    . mupirocin cream (BACTROBAN) 2 % Apply 1 application topically as needed (for skin sores in the knee area).     Marland Kitchen omeprazole (PRILOSEC) 20 MG capsule Take 20 mg by mouth daily.    . traMADol (ULTRAM) 50 MG tablet Take 1-2 tablets (50-100 mg total) by mouth every 6 (six) hours as needed for moderate pain or severe pain. 40 tablet 0  . triamcinolone cream (KENALOG) 0.1 % Apply 1 application topically 2 (two) times daily. Apply to knee and lower leg.     No current facility-administered medications for this visit.    Allergies:   Amoxicillin; Fentanyl; Moxifloxacin; and Nsaids    Social History:  The patient  reports that he quit smoking about 18 years ago. His  smoking use included Cigarettes. He has a 62 pack-year smoking history. He has never used smokeless tobacco. He reports that he does not drink alcohol or use illicit drugs.   Family History:  The patient's family history is not on file. He was adopted.    ROS:  Please see the history of present illness.   Otherwise, review of systems are positive for none.   All other systems are reviewed and negative.    PHYSICAL EXAM: VS:  BP 122/60 mmHg  Pulse 83  Ht 5\' 10"  (1.778 m)  Wt 177 lb (80.287 kg)  BMI 25.40 kg/m2 , BMI Body mass index is 25.4 kg/(m^2). GEN: Well nourished, well developed, in no acute distress HEENT: normal Neck: no JVD, carotid bruits, or masses Cardiac: RRR; no murmurs, rubs, or gallops, Trace edema  Respiratory:  Crackles at bases GI: soft, nontender, nondistended, + BS MS: no deformity or atrophy Skin: warm and dry, no rash Neuro:  Strength and sensation are intact Psych: euthymic mood, full affect   EKG:  EKG was ordered today and showed NSR with nonspecific ST/T wave abnormality which is new from EKG 3/15    Recent Labs: 08/02/2013: Magnesium 2.1 08/29/2013: ALT 15 08/30/2013: BUN 54*; Creatinine 5.64*; Platelets 206 02/22/2014: Hemoglobin 13.6; Potassium 4.1; Sodium 134* 06/01/2014: TSH 1.643    Lipid Panel    Component Value Date/Time   TRIG 434* 02/15/2013 0500      Wt Readings from Last 3 Encounters:  07/30/14 177 lb (80.287 kg)  06/01/14 176 lb 11.2 oz (80.151 kg)  05/11/14 173 lb (78.472 kg)    ASSESSMENT AND PLAN:  1. Chronic combined systolic/diastolic CHF NYHA class II - He has some crackles on exam and LE edema today.  I will check a BNP.  Given his worsening LVF I will stop his Toprol and start Coreg 6.25mg  BID.  I will also start Losartan 50mg  daily.  His volume is managed by peritoneal dialysis.   2.Severe LV dysfunction EF 20-25% - this is new.  His EF last year was 40-45% by echo 07/2013.  ? Etiology of decline in EF.  Possibly due  to progression of CAD.  - med changes as above.  Plan left heart Friday to redefine coronary anatomy and possible CVTS consult pending results although his underlying comorbidities may make him a poor candidate.  3. Paroxysmal atrial flutter - one episode in the setting of acute influenza. His CHADS VASC score is 4. He was not sent home on anticoagulation due to increased weakness and falls at home. Event monitor did not show any further PAF. Continue BB 4. Mild  AI - by echo 07/2014 5. 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 HTN well controlled. Continue amlodipine.  Changing to ARB and Coreg for DCM. 7. Carotid artery stenosis with bilateral bruits - recent dopplers showed the right  carotid artery is >80% stenosed and left is 40-59% in prior endarterectomy - he has been referred back to vascular surgery - I will set him up with Dr. Kellie Simmering ASAP.   8. Dyslipidemia - he refused statin therapy and was referred to lipid clinic but did not come in January.  Will reschedule to see if he is a candidate for [facility] 9 drug.  I stressed the importance that he get on medical therapy for his hyperlipidemia.   9. Severe CAD with mid RCA occlusion, distal LAD stenosis, moderate disease of the left circ. Medical therapy was recommended. He is not having any angin but has had worsening of his LVF.  I have recommended that we proceed with left heart catheterization to evaluate coronary anatomy.  He has not been felt to be a good candidate for revascularization in the past due to his comorbidities but with worsening LVF,  if he has progression of CAD, will need to get input from CVTS to see if he is a candidate for CABG.    I will have him seen in 1  week with the PA to make sure he is tolerating his meds ok.    Current medicines are reviewed at length with the patient today.  The patient does not have concerns  regarding medicines.  The following changes have been made:  D/c of Toprol.   Start Coreg 6.25mg  BID and Losartan 50mg  daily.  I have asked him to check his BP and HR daily for a week and call.    Labs/ tests ordered today include: BMET, BNP     Disposition:   FU with me  in 3 months   Signed, Sueanne Margarita, MD  07/30/2014 11:25 AM    [facility] [address], [place], [place]  [address] Phone: [phone]; Fax: [phone]

## 2014-07-30 NOTE — Telephone Encounter (Signed)
Pt wife called as once they got home she realized he was taking medications differently than shared during visit with Dr. Radford Pax and wanted to know if that would affect the medication changes made during visit.  Pt had not taken the Atenolol since November 2015 and had just been taking the Metoprolol and Norvasc.  Spoke with Dr. Radford Pax she adjust new mediation accordingly, Coreg 6.25 mg BID and Losartan 25 mg daily. Pt is still not to take Metoprolol or Atenolol and continue taking the Norvasc as he has been.  Spoken with Pharmacy to verify they have updated Rx for when pt picks up.  Pt made aware and understands changes. No questions at this time.

## 2014-07-31 ENCOUNTER — Other Ambulatory Visit: Payer: Self-pay | Admitting: Cardiology

## 2014-07-31 DIAGNOSIS — I5022 Chronic systolic (congestive) heart failure: Secondary | ICD-10-CM

## 2014-07-31 NOTE — H&P (Addendum)
Expand All Collapse All      Cardiology Office Note   Date: 07/30/2014   ID: Victor, Lane 04/30/1946, MRN [id number]  PCP: [name], MD Cardiologist: Sueanne Margarita, MD   Chief Complaint  Patient presents with  . Coronary Artery Disease  . Cardiomyopathy  . Hypertension  . Hyperlipidemia     History of Present Illness: 69 year old with extensive PMH including ESRD/stage 4 CKD with HD on M/W/F, w/ PD catheter placement,chronic systolic CHF EF 21-19%, severe CAD with mid RCA occlusion, distal LAD stenosis, moderate disease of the left circ by cath 07/2013 and mdical therapy was recommended. He also has paroxysmal atrial flutter. His CHADS VASC score is 4 but it was recommended that he not be anticoagulated due to weakness with frequent falls at home, especially since this was his first episode of PAflutter in the setting of acute influenza with respiratory failure. He is now on Peritoneal dialysis and is doing well with that. He recently had a 2D echo to followup on AI and his LVF has significantly declined and is now 20-25%. He also was noted to have progression of his carotid artery stenosis now with >80% stenosis of the right carotid artery and has been referred to vascular surgery. He has refused statin therapy in the past and was referred to lipid clinic in January but was not seen. He denies any chest pain or pressure.He occasionally has some DOE depending on how much removal he does with PD and some with exertion. He says his LE edema has resolved. He denies any palpitations or dizziness.    Past Medical History  Diagnosis Date  . Neuropathy   . Arthritis   . H/O vitamin D deficiency   . Hearing loss   . Hypercholesteremia   . 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 S/P radiation therapy 01/24/2013-03/14/2013    Larynx/glottis / 63 Gy in  28 fractions  . Hypertension   . GERD (gastroesophageal reflux disease)   . Complication of anesthesia     hard to wake up and then panics waking up  . Sleep apnea     to request from Dr.Fried was done about 51yrs ago but no cpap d/t weight loss  . Fibromyalgia     takes tramadol daily as needed  . Myalgia and myositis   . Peripheral neuropathy   . History of gout   . History of colon polyps   . ESRD on dialysis     - Stage 5- follwed by Dr Sharlotte Alamo in [place]  . Diabetes mellitus   . Cataracts, bilateral   . COPD (chronic obstructive pulmonary disease)   . Dilated aortic root   . Aortic valve sclerosis   . Diastolic dysfunction   . Coronary artery disease     severe CAD with mid RCA occlusion, distal LAD stenosis, moderate disease of the left circ. Medical therapy was recommended.  . Aortic regurgitation   . Carotid artery occlusion     s/p left CEA with 40-59% restenosis and >80% stenosis of the Right ICA - followed by Dr. Kellie Simmering    Past Surgical History  Procedure Laterality Date  . Cholecystectomy  1993  . Rotator cuff repair  07/06/2011    right  . Carotid endarterectomy Left   . Coolonoscopy    . Colonoscopy w/ biopsies and polypectomy      Hx: of  .  Microlaryngoscopy with laser N/A 01/04/2013    Procedure: MICROLARYNGOSCOPY WITH BIOPSY/LASER; Surgeon: [name], MD; Location: [place]; Service: ENT; Laterality: N/A;  . Av fistula placement Left 02/23/2013    Procedure: ARTERIOVENOUS (AV) FISTULA CREATION; Surgeon: Conrad Floyd, MD; Location: [place]; Service: Vascular; Laterality: Left;  . Capd insertion N/A 08/01/2013    Procedure: LAPAROSCOPIC INSERTION CONTINUOUS AMBULATORY PERITONEAL DIALYSIS CATHETER; Surgeon: [name], MD; Location: [place]; Service: General; Laterality: N/A;  . Laparoscopic abdominal exploration N/A  08/01/2013    Procedure: LAPAROSCOPIC ABDOMINAL EXPLORATION; Surgeon: [name], MD; Location: [place]; Service: General; Laterality: N/A;  . Ulnar nerve transposition Left 02/22/2014    Procedure: LEFT ULNAR NERVE RELEASE AND OR TRANSPOSITION; Surgeon: [name], MD; Location: [place]; Service: Orthopedics; Laterality: Left;  . Left heart catheterization with coronary angiogram N/A 08/14/2013    Procedure: LEFT HEART CATHETERIZATION WITH CORONARY ANGIOGRAM; Surgeon: [name], MD; Location: [facility] CATH LAB; Service: Cardiovascular; Laterality: N/A;     Current Outpatient Prescriptions  Medication Sig Dispense Refill  . acetaminophen (TYLENOL) 500 MG tablet Take 500 mg by mouth every 6 (six) hours as needed.    Marland Kitchen amLODipine (NORVASC) 10 MG tablet Take 10 mg by mouth daily.    Marland Kitchen aspirin 81 MG tablet Take 81 mg by mouth daily.     . calcium acetate (PHOSLO) 667 MG capsule Take 1,334 mg by mouth 3 (three) times daily with meals.    Marland Kitchen gentamicin cream (GARAMYCIN) 0.1 % Apply 1 application topically 2 (two) times daily.  99  . glipiZIDE (GLUCOTROL XL) 2.5 MG 24 hr tablet Take 2.5 mg by mouth daily as needed (diabetes).    Marland Kitchen ipratropium (ATROVENT) 0.06 % nasal spray Place 2 sprays into both nostrils as needed for rhinitis.    . metoprolol succinate (TOPROL-XL) 50 MG 24 hr tablet Take 50 mg by mouth daily. Take with or immediately following a meal.    . multivitamin (RENA-VIT) TABS tablet Take 1 tablet by mouth daily.    . mupirocin cream (BACTROBAN) 2 % Apply 1 application topically as needed (for skin sores in the knee area).     Marland Kitchen omeprazole (PRILOSEC) 20 MG capsule Take 20 mg by mouth daily.    . traMADol (ULTRAM) 50 MG tablet Take 1-2 tablets (50-100 mg total) by mouth every 6 (six) hours as needed for moderate pain or severe pain. 40 tablet 0  . triamcinolone cream (KENALOG) 0.1 % Apply 1  application topically 2 (two) times daily. Apply to knee and lower leg.     No current facility-administered medications for this visit.    Allergies: Amoxicillin; Fentanyl; Moxifloxacin; and Nsaids    Social History: The patient  reports that he quit smoking about 18 years ago. His smoking use included Cigarettes. He has a 62 pack-year smoking history. He has never used smokeless tobacco. He reports that he does not drink alcohol or use illicit drugs.   Family History: The patient's family history is not on file. He was adopted.    ROS: Please see the history of present illness. Otherwise, review of systems are positive for none. All other systems are reviewed and negative.    PHYSICAL EXAM: VS: BP 122/60 mmHg  Pulse 83  Ht 5\' 10"  (1.778 m)  Wt 177 lb (80.287 kg)  BMI 25.40 kg/m2 , BMI Body mass index is 25.4 kg/(m^2). GEN: Well nourished, well developed, in no acute distress  HEENT: normal  Neck: no JVD, carotid  bruits, or masses Cardiac: RRR; no murmurs, rubs, or gallops, Trace edema  Respiratory: Crackles at bases GI: soft, nontender, nondistended, + BS MS: no deformity or atrophy  Skin: warm and dry, no rash Neuro: Strength and sensation are intact Psych: euthymic mood, full affect   EKG: EKG was ordered today and showed NSR with nonspecific ST/T wave abnormality which is new from EKG 3/15    Recent Labs: 08/02/2013: Magnesium 2.1 08/29/2013: ALT 15 08/30/2013: BUN 54*; Creatinine 5.64*; Platelets 206 02/22/2014: Hemoglobin 13.6; Potassium 4.1; Sodium 134* 06/01/2014: TSH 1.643    Lipid Panel  Labs (Brief)       Component Value Date/Time   TRIG 434* 02/15/2013 0500       Wt Readings from Last 3 Encounters:  07/30/14 177 lb (80.287 kg)  06/01/14 176 lb 11.2 oz (80.151 kg)  05/11/14 173 lb (78.472 kg)    ASSESSMENT AND PLAN:  1. Chronic combined systolic/diastolic CHF NYHA class II - He has some crackles on exam and  LE edema today. I will check a BNP. Given his worsening LVF I will stop his Toprol and start Coreg 6.25mg  BID. I will also start Losartan 50mg  daily. His volume is managed by peritoneal dialysis.  2.Severe LV dysfunction EF 20-25% - this is new. His EF last year was 40-45% by echo 07/2013. ? Etiology of decline in EF. Possibly due to progression of CAD.  - med changes as above. Plan left heart Friday to redefine coronary anatomy and possible CVTS consult pending results although his underlying comorbidities may make him a poor candidate.  3. Paroxysmal atrial flutter - one episode in the setting of acute influenza. His CHADS VASC score is 4. He was not sent home on anticoagulation due to increased weakness and falls at home. Event monitor did not show any further PAF. Continue BB 4. Mild AI - by echo 07/2014 5. 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 HTN well controlled. Continue amlodipine. Changing to ARB and Coreg for DCM. 7. Carotid artery stenosis with bilateral bruits - recent dopplers showed the right carotid artery is >80% stenosed and left is 40-59% in prior endarterectomy - he has been referred back to vascular surgery - I will set him up with Dr. Kellie Simmering ASAP.  8. Dyslipidemia - he refused statin therapy and was referred to lipid clinic but did not come in January. Will reschedule to see if he is a candidate for PCSK 9 drug. I stressed the importance that he get on medical therapy for his hyperlipidemia.  9. Severe CAD with mid RCA occlusion, distal LAD stenosis, moderate disease of the left circ. Medical therapy was recommended. He is not having any angin but has had worsening of his LVF. I have recommended that we proceed with left heart catheterization to evaluate coronary anatomy. He has not been felt to be a good candidate for revascularization in the past due to  his comorbidities but with worsening LVF, if he has progression of CAD, will need to get input from CVTS to see if he is a candidate for CABG.   I will have him seen in 1 week with the PA to make sure he is tolerating his meds ok.   Current medicines are reviewed at length with the patient today. The patient does not have concerns regarding medicines.  The following changes have been made: D/c of Toprol. Start Coreg 6.25mg   BID and Losartan 50mg  daily. I have asked him to check his BP and HR daily for a week and call.   Labs/ tests ordered today include: BMET, BNP     Disposition: FU with me in 3 months   Signed, Sueanne Margarita, MD  07/30/2014 11:25 AM  [facility] [place], [place], [place] [address] Phone: [phone]; Fax: [phone]

## 2014-08-01 ENCOUNTER — Encounter (HOSPITAL_COMMUNITY): Payer: Self-pay | Admitting: Pharmacy Technician

## 2014-08-03 ENCOUNTER — Encounter (HOSPITAL_COMMUNITY): Payer: Self-pay | Admitting: General Practice

## 2014-08-03 ENCOUNTER — Encounter (HOSPITAL_COMMUNITY)
Admission: RE | Disposition: A | Discharge status: Home / Self Care | Payer: Self-pay | Source: Ambulatory Visit | Attending: Interventional Cardiology

## 2014-08-03 ENCOUNTER — Ambulatory Visit (HOSPITAL_COMMUNITY)
Admission: RE | Admit: 2014-08-03 | Discharge: 2014-08-04 | Disposition: A | Discharge status: Home / Self Care | Payer: Medicare Other | Source: Ambulatory Visit | Attending: Interventional Cardiology | Admitting: Interventional Cardiology

## 2014-08-03 DIAGNOSIS — E119 Type 2 diabetes mellitus without complications: Secondary | ICD-10-CM | POA: Insufficient documentation

## 2014-08-03 DIAGNOSIS — E785 Hyperlipidemia, unspecified: Secondary | ICD-10-CM | POA: Diagnosis not present

## 2014-08-03 DIAGNOSIS — N186 End stage renal disease: Secondary | ICD-10-CM | POA: Diagnosis not present

## 2014-08-03 DIAGNOSIS — M797 Fibromyalgia: Secondary | ICD-10-CM | POA: Insufficient documentation

## 2014-08-03 DIAGNOSIS — I5022 Chronic systolic (congestive) heart failure: Secondary | ICD-10-CM | POA: Diagnosis not present

## 2014-08-03 DIAGNOSIS — K219 Gastro-esophageal reflux disease without esophagitis: Secondary | ICD-10-CM | POA: Diagnosis not present

## 2014-08-03 DIAGNOSIS — I429 Cardiomyopathy, unspecified: Secondary | ICD-10-CM | POA: Insufficient documentation

## 2014-08-03 DIAGNOSIS — I4892 Unspecified atrial flutter: Secondary | ICD-10-CM | POA: Diagnosis not present

## 2014-08-03 DIAGNOSIS — I120 Hypertensive chronic kidney disease with stage 5 chronic kidney disease or end stage renal disease: Secondary | ICD-10-CM | POA: Insufficient documentation

## 2014-08-03 DIAGNOSIS — I2510 Atherosclerotic heart disease of native coronary artery without angina pectoris: Secondary | ICD-10-CM | POA: Insufficient documentation

## 2014-08-03 DIAGNOSIS — I272 Other secondary pulmonary hypertension: Secondary | ICD-10-CM | POA: Insufficient documentation

## 2014-08-03 DIAGNOSIS — Z7982 Long term (current) use of aspirin: Secondary | ICD-10-CM | POA: Diagnosis not present

## 2014-08-03 DIAGNOSIS — Z992 Dependence on renal dialysis: Secondary | ICD-10-CM | POA: Diagnosis not present

## 2014-08-03 DIAGNOSIS — Z87891 Personal history of nicotine dependence: Secondary | ICD-10-CM | POA: Insufficient documentation

## 2014-08-03 HISTORY — PX: LEFT AND RIGHT HEART CATHETERIZATION WITH CORONARY ANGIOGRAM: SHX5449

## 2014-08-03 HISTORY — DX: Dependence on renal dialysis: Z99.2

## 2014-08-03 HISTORY — DX: End stage renal disease: N18.6

## 2014-08-03 HISTORY — DX: Type 2 diabetes mellitus without complications: E11.9

## 2014-08-03 HISTORY — PX: CORONARY ANGIOPLASTY: SHX604

## 2014-08-03 LAB — POCT I-STAT 3, VENOUS BLOOD GAS (G3P V)
ACID-BASE EXCESS: 1 mmol/L (ref 0.0–2.0)
Bicarbonate: 25.2 mEq/L — ABNORMAL HIGH (ref 20.0–24.0)
O2 Saturation: 61 %
PO2 VEN: 31 mmHg (ref 30.0–45.0)
TCO2: 26 mmol/L (ref 0–100)
pCO2, Ven: 37.9 mmHg — ABNORMAL LOW (ref 45.0–50.0)
pH, Ven: 7.43 — ABNORMAL HIGH (ref 7.250–7.300)

## 2014-08-03 LAB — PROTIME-INR
INR: 1.07 (ref 0.00–1.49)
Prothrombin Time: 14.1 seconds (ref 11.6–15.2)

## 2014-08-03 LAB — POCT I-STAT 3, ART BLOOD GAS (G3+)
Acid-Base Excess: 1 mmol/L (ref 0.0–2.0)
BICARBONATE: 24.4 meq/L — AB (ref 20.0–24.0)
O2 Saturation: 92 %
TCO2: 25 mmol/L (ref 0–100)
pCO2 arterial: 33.5 mmHg — ABNORMAL LOW (ref 35.0–45.0)
pH, Arterial: 7.471 — ABNORMAL HIGH (ref 7.350–7.450)
pO2, Arterial: 60 mmHg — ABNORMAL LOW (ref 80.0–100.0)

## 2014-08-03 LAB — POCT ACTIVATED CLOTTING TIME
ACTIVATED CLOTTING TIME: 313 s
ACTIVATED CLOTTING TIME: 147 s
Activated Clotting Time: 227 seconds

## 2014-08-03 LAB — GLUCOSE, CAPILLARY
GLUCOSE-CAPILLARY: 108 mg/dL — AB (ref 70–99)
Glucose-Capillary: 55 mg/dL — ABNORMAL LOW (ref 70–99)
Glucose-Capillary: 121 mg/dL — ABNORMAL HIGH (ref 70–99)
Glucose-Capillary: 82 mg/dL (ref 70–99)
Glucose-Capillary: 82 mg/dL (ref 70–99)

## 2014-08-03 SURGERY — LEFT AND RIGHT HEART CATHETERIZATION WITH CORONARY ANGIOGRAM
Anesthesia: LOCAL

## 2014-08-03 MED ORDER — ACETAMINOPHEN 325 MG PO TABS
650.0000 mg | ORAL_TABLET | ORAL | Status: DC | PRN
  Administered 2014-08-04: 07:00:00 650 mg via ORAL
  Filled 2014-08-03: qty 2

## 2014-08-03 MED ORDER — MUPIROCIN CALCIUM 2 % EX CREA: 1.0000 "application " | TOPICAL_CREAM | CUTANEOUS | Status: DC | PRN

## 2014-08-03 MED ORDER — ASPIRIN 81 MG PO TABS: 81.0000 mg | ORAL_TABLET | Freq: Every day | ORAL | Status: DC

## 2014-08-03 MED ORDER — SODIUM CHLORIDE 0.9 % IJ SOLN: 3.0000 mL | Freq: Two times a day (BID) | INTRAMUSCULAR | Status: DC

## 2014-08-03 MED ORDER — FENTANYL CITRATE 0.05 MG/ML IJ SOLN
INTRAMUSCULAR | Status: AC
  Filled 2014-08-03: qty 2

## 2014-08-03 MED ORDER — CANGRELOR TETRASODIUM 50 MG IV SOLR
INTRAVENOUS | Status: AC
  Filled 2014-08-03: qty 50

## 2014-08-03 MED ORDER — DELFLEX-LC/2.5% DEXTROSE 394 MOSM/L IP SOLN
Freq: Once | INTRAPERITONEAL | Status: AC
  Administered 2014-08-03: 15000 mL via INTRAPERITONEAL

## 2014-08-03 MED ORDER — GENTAMICIN SULFATE 0.1 % EX CREA
1.0000 "application " | TOPICAL_CREAM | Freq: Two times a day (BID) | CUTANEOUS | Status: DC
  Administered 2014-08-03 – 2014-08-04 (×2): 1 via TOPICAL
  Filled 2014-08-03: qty 15

## 2014-08-03 MED ORDER — TRIAMCINOLONE ACETONIDE 0.1 % EX CREA
1.0000 "application " | TOPICAL_CREAM | Freq: Two times a day (BID) | CUTANEOUS | Status: DC
  Filled 2014-08-03: qty 15

## 2014-08-03 MED ORDER — ONDANSETRON HCL 4 MG/2ML IJ SOLN
4.0000 mg | Freq: Four times a day (QID) | INTRAMUSCULAR | Status: DC | PRN
  Administered 2014-08-03: 4 mg via INTRAVENOUS
  Filled 2014-08-03: qty 2

## 2014-08-03 MED ORDER — DELFLEX-LC/2.5% DEXTROSE 394 MOSM/L IP SOLN: INTRAPERITONEAL | Status: DC

## 2014-08-03 MED ORDER — ASPIRIN 81 MG PO CHEW: 81.0000 mg | CHEWABLE_TABLET | ORAL | Status: DC

## 2014-08-03 MED ORDER — SODIUM CHLORIDE 0.9 % IJ SOLN: 3.0000 mL | INTRAMUSCULAR | Status: DC | PRN

## 2014-08-03 MED ORDER — SODIUM CHLORIDE 0.9 % IV SOLN
INTRAVENOUS | Status: DC
  Administered 2014-08-03: 08:00:00 via INTRAVENOUS

## 2014-08-03 MED ORDER — CARVEDILOL 6.25 MG PO TABS
6.2500 mg | ORAL_TABLET | Freq: Two times a day (BID) | ORAL | Status: DC
  Administered 2014-08-03 – 2014-08-04 (×2): 6.25 mg via ORAL
  Filled 2014-08-03 (×4): qty 1

## 2014-08-03 MED ORDER — GLUCOSE 40 % PO GEL
ORAL | Status: AC
  Filled 2014-08-03: qty 1

## 2014-08-03 MED ORDER — MIDAZOLAM HCL 2 MG/2ML IJ SOLN
INTRAMUSCULAR | Status: AC
Start: 2014-08-03 — End: 2014-08-03
  Filled 2014-08-03: qty 2

## 2014-08-03 MED ORDER — LIDOCAINE HCL (PF) 1 % IJ SOLN
INTRAMUSCULAR | Status: AC
Start: 2014-08-03 — End: 2014-08-03
  Filled 2014-08-03: qty 30

## 2014-08-03 MED ORDER — BISMUTH SUBSALICYLATE 262 MG/15ML PO SUSP
30.0000 mL | ORAL | Status: DC | PRN
  Administered 2014-08-03 (×2): 30 mL via ORAL
  Filled 2014-08-03: qty 236

## 2014-08-03 MED ORDER — IPRATROPIUM BROMIDE 0.06 % NA SOLN
2.0000 | Freq: Two times a day (BID) | NASAL | Status: DC | PRN
  Administered 2014-08-03 – 2014-08-04 (×2): 2 via NASAL
  Filled 2014-08-03: qty 15

## 2014-08-03 MED ORDER — GLIPIZIDE ER 2.5 MG PO TB24
2.5000 mg | ORAL_TABLET | Freq: Every day | ORAL | Status: DC | PRN
  Filled 2014-08-03: qty 1

## 2014-08-03 MED ORDER — TICAGRELOR 90 MG PO TABS
ORAL_TABLET | ORAL | Status: AC
Start: 2014-08-03 — End: 2014-08-03
  Filled 2014-08-03: qty 2

## 2014-08-03 MED ORDER — ASPIRIN 81 MG PO CHEW
81.0000 mg | CHEWABLE_TABLET | Freq: Every day | ORAL | Status: DC
  Administered 2014-08-04: 10:00:00 81 mg via ORAL
  Filled 2014-08-03: qty 1

## 2014-08-03 MED ORDER — HEPARIN (PORCINE) IN NACL 2-0.9 UNIT/ML-% IJ SOLN
INTRAMUSCULAR | Status: AC
  Filled 2014-08-03: qty 1000

## 2014-08-03 MED ORDER — ACETAMINOPHEN 500 MG PO TABS: 500.0000 mg | ORAL_TABLET | Freq: Four times a day (QID) | ORAL | Status: DC | PRN

## 2014-08-03 MED ORDER — SODIUM CHLORIDE 0.9 % IV SOLN: 250.0000 mL | INTRAVENOUS | Status: DC | PRN

## 2014-08-03 MED ORDER — HEPARIN SODIUM (PORCINE) 1000 UNIT/ML IJ SOLN
INTRAMUSCULAR | Status: AC
  Filled 2014-08-03: qty 1

## 2014-08-03 MED ORDER — LOSARTAN POTASSIUM 25 MG PO TABS
25.0000 mg | ORAL_TABLET | Freq: Every day | ORAL | Status: DC
  Administered 2014-08-04: 10:00:00 25 mg via ORAL
  Filled 2014-08-03: qty 1

## 2014-08-03 MED ORDER — DEXTROSE 50 % IV SOLN
INTRAVENOUS | Status: AC
  Administered 2014-08-03: 50 mL
  Filled 2014-08-03: qty 50

## 2014-08-03 MED ORDER — CALCIUM ACETATE 667 MG PO CAPS
1334.0000 mg | ORAL_CAPSULE | Freq: Three times a day (TID) | ORAL | Status: DC
  Administered 2014-08-03 – 2014-08-04 (×3): 1334 mg via ORAL
  Filled 2014-08-03 (×5): qty 2

## 2014-08-03 MED ORDER — AMLODIPINE BESYLATE 10 MG PO TABS
10.0000 mg | ORAL_TABLET | Freq: Every day | ORAL | Status: DC
  Administered 2014-08-04: 10 mg via ORAL
  Filled 2014-08-03: qty 1

## 2014-08-03 MED ORDER — TICAGRELOR 90 MG PO TABS
90.0000 mg | ORAL_TABLET | Freq: Two times a day (BID) | ORAL | Status: DC
  Administered 2014-08-03 – 2014-08-04 (×2): 90 mg via ORAL
  Filled 2014-08-03 (×3): qty 1

## 2014-08-03 MED ORDER — PANTOPRAZOLE SODIUM 40 MG PO TBEC
40.0000 mg | DELAYED_RELEASE_TABLET | Freq: Every day | ORAL | Status: DC
  Administered 2014-08-03 – 2014-08-04 (×2): 40 mg via ORAL
  Filled 2014-08-03 (×2): qty 1

## 2014-08-03 MED ORDER — TRAMADOL HCL 50 MG PO TABS
50.0000 mg | ORAL_TABLET | Freq: Two times a day (BID) | ORAL | Status: DC | PRN
  Administered 2014-08-03: 13:00:00 50 mg via ORAL
  Administered 2014-08-04: 01:00:00 100 mg via ORAL
  Filled 2014-08-03: qty 1
  Filled 2014-08-03: qty 2

## 2014-08-03 NOTE — Progress Notes (Signed)
HYPOGLYCEMIC EVENT  CBG: 55  Treatment: D50 IV 25 mL PT is NPO  Symptoms: None  Follow-up CBG: Time:1330 CBG Result121  Possible Reasons for Event: Inadequate meal intake  Comments/MD notified

## 2014-08-03 NOTE — Consult Note (Signed)
Renal Service Consult Note [facility]  H[place] [name] 08/03/2014 Roney Jaffe D Requesting Physician:  Dr Irish Lack  Reason for Consult:  PD patient s/p heart cath today HPI: The patient is a 69 y.o. year-old with hx of HTN, SCCa of the vocal fords Rx'd in 2014 with XRT, ESRD on dialysis since 2014.  Was on HD not doing well and now is doing "great" on PD.  Presented with hearth cath procedure done earlier today.  Hx prior cath 07/2013 with severe disease (midRCA occlusion, distal LAD stenosis, mod dz of left Cx). Medical Rx was rec'd.    Today pt had severe disease in mid LAD rx'd with DES, mild/mod disease in LCx and branches, occluded post lat branch of RCA.  Severely dec'd LVsystolic function. Asked to see for dialysis.    Chart review: 10/12 - left CEA, DM, HTN, CKD, FM 8/14 - recently dx'd invasive SCCa of bilat vocal cords in 7/125 undergoing XRT presented lethargic, weak, poorly responsive.  AMS x 2-3 days, SOB > presented with hypoxemia, K 7, Cr 11. Intubated, admitted to ICU for asp PNA. Difficult intubation due to [facility] and from radiation.  ENT called and did tracheostomy. AMS improved. 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  Finished abx for asp PNA.  Went to rehab. Had AVF done 9/5 by [facility].  Had trach size weaned down.  DC'd with trach capped during the day. [place] home, home PT/OT. Supplement w PEG feeds prn.   12/14 - nausea, abd pain , multiple falls > K up, AST/ALT ~3000, no jaundice.  Hx prior chole.  rx'd with HD for high K.  ^LFT's felt to be due to shock liver from hypotensive episodes w HD.  Imporved, no symptoms. ESRD on HD MWF.   2/15 - elective lap placement of PD cath > post op had SOB requiring intubation. Extubated next day and went home.  Had HD.   3/15 - hx ESRD, PAF, syst/diast CHF presented with gen weakness, fluid overload and deconditioning.  Had HD x 2.  Trop up at 2.23 but not due to cardiac ischemia, prob d/t CKD>  Seen by cards,  cont ASA/ BB. Started on midodrine for low BP's on HD.     ROS  had diarrhea and indigestion a few days goo, resolved  no CP / SOB  Past Medical History  Past Medical History  Diagnosis Date  . Neuropathy   . H/O vitamin D deficiency   . Hearing loss   . Hypercholesteremia   . 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 S/P radiation therapy 01/24/2013-03/14/2013    Larynx/glottis / 63 Gy in 28 fractions  . Hypertension   . GERD (gastroesophageal reflux disease)   . Sleep apnea     to request from Dr.Fried was done about 29yrs ago but no cpap d/t weight loss  . Fibromyalgia     takes tramadol daily as needed  . Myalgia and myositis   . Peripheral neuropathy   . History of colon polyps   . Cataracts, bilateral   . Dilated aortic root   . Aortic valve sclerosis   . Diastolic dysfunction   . Coronary artery disease     severe CAD with mid RCA occlusion, distal LAD stenosis, moderate disease of the left circ. Medical therapy was recommended.  . Aortic regurgitation   . Carotid artery occlusion     s/p left  CEA with 40-59% restenosis and >80% stenosis of the Right ICA - followed by Dr. Kellie Simmering  . Complication of anesthesia     hard to wake up and then panics waking up  . CHF (congestive heart failure)   . ESRD on dialysis 01/2013    - Stage 5- follwed by Dr Sharlotte Alamo in [place]  . ESRD on peritoneal dialysis 09/2013    "followed by [name]; [facility]" (08/03/2014)  . Type II diabetes mellitus     "diet controlled now" (08/03/2014)  . Arthritis     "just age related" (08/03/2014)   Past Surgical History  Past Surgical History  Procedure Laterality Date  . Cholecystectomy  1993  . Shoulder open rotator cuff repair Right 07/06/2011  . Carotid endarterectomy Left   . Colonoscopy w/ biopsies and polypectomy      Hx: of  . Microlaryngoscopy with laser N/A 01/04/2013    Procedure: MICROLARYNGOSCOPY WITH  BIOPSY/LASER;  Surgeon: [name], MD;  Location: [place];  Service: ENT;  Laterality: N/A;  . Av fistula placement Left 02/23/2013    Procedure: ARTERIOVENOUS (AV) FISTULA CREATION;  Surgeon: Conrad Claycomo, MD;  Location: [place];  Service: Vascular;  Laterality: Left;  . Capd insertion N/A 08/01/2013    Procedure: LAPAROSCOPIC INSERTION CONTINUOUS AMBULATORY PERITONEAL DIALYSIS CATHETER;  Surgeon: [name], MD;  Location: [place];  Service: General;  Laterality: N/A;  . Laparoscopic abdominal exploration N/A 08/01/2013    Procedure: LAPAROSCOPIC ABDOMINAL EXPLORATION;  Surgeon: [name], MD;  Location: [place];  Service: General;  Laterality: N/A;  . Ulnar nerve transposition Left 02/22/2014    Procedure: LEFT ULNAR NERVE RELEASE AND OR TRANSPOSITION;  Surgeon: [name], MD;  Location: [place];  Service: Orthopedics;  Laterality: Left;  . Left heart catheterization with coronary angiogram N/A 08/14/2013    Procedure: LEFT HEART CATHETERIZATION WITH CORONARY ANGIOGRAM;  Surgeon: [name], MD;  Location: [facility] C[facility] LAB;  Service: Cardiovascular;  Laterality: N/A;  . Coronary angioplasty  08/03/2014  . Cardiac catheterization  07/2013  . Tracheostomy  01/2013  . Tracheostomy closure  02/2013   Family History  Family History  Problem Relation Age of Onset  . Adopted: Yes  . Family history unknown: Yes   Social History  reports that he quit smoking about 19 years ago. His smoking use included Cigarettes. He has a 62 pack-year smoking history. He has never used smokeless tobacco. He reports that he does not drink alcohol or use illicit drugs. Allergies  Allergies  Allergen Reactions  . Amoxicillin     Causes upset stomach  . Fentanyl     Confusion when patch applied  . Moxifloxacin Other (See Comments)    unknown  . Nsaids Other (See Comments)    Severe kidney disease/ESRD   Home medications Prior to Admission medications   Medication Sig Start Date End Date Taking?  Authorizing Provider  acetaminophen (TYLENOL) 500 MG tablet Take 500 mg by mouth every 6 (six) hours as needed for mild pain or headache.    Yes Historical Provider, MD  amLODipine (NORVASC) 10 MG tablet Take 10 mg by mouth daily.   Yes Historical Provider, MD  aspirin 81 MG tablet Take 81 mg by mouth daily.    Yes Historical Provider, MD  calcium acetate (PHOSLO) 667 MG capsule Take 1,334 mg by mouth 3 (three) times daily with meals.   Yes Historical Provider, MD  carvedilol (COREG) 6.25 MG tablet Take 1  tablet (6.25 mg total) by mouth 2 (two) times daily with a meal. 07/30/14  Yes Sueanne Margarita, MD  esomeprazole (NEXIUM) 20 MG capsule Take 20 mg by mouth daily at 12 noon.   Yes Historical Provider, MD  gentamicin cream (GARAMYCIN) 0.1 % Apply 1 application topically 2 (two) times daily. 06/05/14  Yes Historical Provider, MD  glipiZIDE (GLUCOTROL XL) 2.5 MG 24 hr tablet Take 2.5 mg by mouth daily as needed (diabetes).   Yes Historical Provider, MD  ipratropium (ATROVENT) 0.06 % nasal spray Place 2 sprays into both nostrils as needed for rhinitis.   Yes Historical Provider, MD  losartan (COZAAR) 25 MG tablet Take 1 tablet (25 mg total) by mouth daily. 07/30/14  Yes Sueanne Margarita, MD  multivitamin (RENA-VIT) TABS tablet Take 1 tablet by mouth daily.   Yes Historical Provider, MD  mupirocin cream (BACTROBAN) 2 % Apply 1 application topically as needed (for skin sores in the knee area).    Yes Historical Provider, MD  traMADol (ULTRAM) 50 MG tablet Take 1-2 tablets (50-100 mg total) by mouth every 6 (six) hours as needed for moderate pain or severe pain. 08/01/13  Yes Michael Boston, MD  triamcinolone cream (KENALOG) 0.1 % Apply 1 application topically 2 (two) times daily. Apply to knee and lower leg. 07/18/13  Yes Historical Provider, MD   Liver Function Tests No results for input(s): AST, ALT, ALKPHOS, BILITOT, PROT, ALBUMIN in the last 168 hours. No results for input(s): LIPASE, AMYLASE in the last 168  hours. CBC  Recent Labs Lab 07/30/14 1234  WBC 8.2  NEUTROABS 6.1  HGB 11.6*  HCT 34.1*  MCV 98.3  PLT 132.4   Basic Metabolic Panel  Recent Labs Lab 07/30/14 1234  NA 137  K 4.1  CL 94*  CO2 27  GLUCOSE 95  BUN 63*  CREATININE 9.46*  CALCIUM 9.4    Filed Vitals:   08/03/14 0721 08/03/14 1030 08/03/14 1300 08/03/14 1637  BP: 155/72  149/56 135/50  Pulse: 104 95 87 97  Temp: 97.5 F (36.4 C)  97.1 F (36.2 C) 97.9 F (36.6 C)  TempSrc: Oral  Oral Oral  Resp: 16  18 20   Height: 5\' 10"  (1.778 m)     Weight: 74.844 kg (165 lb)     SpO2: 98%  96% 94%   Exam Pleasant, no disrtress No rash, cyanosis or gangrene Sclera anicteric, throat clear No jvd Chest clear bilat RRR no MRG Abd soft, NTND, LLL PD cath in place Trace LE edema pretib on L, R w no edma Neuro is alert, o x 3, nf  PD:  Hangs 3 5-L bags of 2.5% at 9pm.  Does 4 exchanges (2500 cc each) of 2.5% every night and then leaves 1000cc in for a daybag.  No pause/ mid-day exchange.     Assessment: 1. LV dysfunction - heart cath not found to have any sig cor artery disease 2. ESRD on PD 3. Mild volume excess 4. HTN on norvasc, coreg, losartan 5. 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

## 2014-08-03 NOTE — Progress Notes (Signed)
Site area: right groin  Site Prior to Removal:  Level 0  Pressure Applied For 20 MINUTES    Minutes Beginning at 1600  Manual:   Yes.    Patient Status During Pull:  AAO x4   Post Pull Groin Site:  Level 0  Post Pull Instructions Given:  Yes.    Post Pull Pulses Present:  Yes.    Dressing Applied:  Yes.    Comments:  Tolerated procedure well

## 2014-08-03 NOTE — Discharge Summary (Signed)
Physician Discharge Summary  Patient ID: Victor Lane MRN: [id number] DOB/AGE: [id number] 69 y.o.   Primary Cardiologist: Dr. Radford Pax  Admit date: 08/03/2014 Discharge date: 08/04/2014  Admission Diagnoses: Severe LV Dysfunction   Discharge Diagnoses:  Active Problems:   Chronic systolic congestive heart failure   Coronary arteriosclerosis   Cardiomyopathy     Discharged Condition: stable  Hospital Course:   The patient is a 69 y/o male followed by Dr. Radford Pax, with extensive PMH including ESRD/stage 4 CKD with HD on M/W/F, w/ PD catheter placement, chronic systolic CHF, severe CAD and paroxsymal atrial flutter admitted for LHC in the setting of worsening LV function. EF changed from 40-45% down to 20-25% on most recent echo 07/24/14. He was seen by Dr.Turner in clinic on 08/01/14 and subsequently referred for diagnostic LHC.   He presented to [facility] on 08/03/14 to undergo the planned procedure, which was performed by Dr. Irish Lack. Access was obtained via the right femoral artery. He was found to have a widely patent left main coronary artery. There was severe disease in the mid LAD which was successfully treated with PCI + DES. There was mild to moderate disease in the LCx and its branches. There was CTO of the posterior lateral branch of the RCA.  No PCi was performed on the RCA. It was noted that If he develops angina which is refractory to medical therapy, could consider CTO PCI of the distal RCA. He tolerated the procedure well and left the cath lab in stable condition. He was placed on DAPT with ASA + Brilinta. He was continued on BB and ARB therapy. He had no recurrent CP. He had no post cath complications. The right femoral access site remained stable and free from complication. No difficulties ambulating. His vital signs remained stable. He was continued on PD for ESRD. He was last seen and examined by Dr. Acie Fredrickson who determined he was stable for discharge home. Post-hospital f/u will be  arranged with Dr. Radford Pax.    Consults: None  Significant Diagnostic Studies:   LHC 08/03/14 HEMODYNAMICS: Aortic pressure was 160/68; LV pressure was 159/13; LVEDP 19. There was no gradient between the left ventricle and aorta.   ANGIOGRAPHIC DATA: The left main coronary artery is a short vessel but patent.  The left anterior descending artery is a large vessel which wraps around the apex. There is mild diffuse atherosclerosis. There are 2 large diagonals. The first diagonal is widely patent. The second diagonal has moderate ostial stenosis. Just past the second diagonal, there is moderate atherosclerosis. There is a segment of diffuse disease which terminates in a focal area of severe disease, up to 90% and calcified. The remainder of the LAD appears widely patent. There are left to right collaterals feeding the distal RCA system.  The left circumflex artery is a large vessel. There is a moderate lesion proximally. There is a large first obtuse marginal which has mild disease. In the remainder of the circumflex, there is mild diffuse disease. The second significant obtuse marginal is patent.  The right coronary artery is patent proximally. The bifurcation appears to occur early. Just after the bifurcation, there is a PDA stenosis. The posterior lateral artery is occluded. There are left to right collaterals which fill the distal RCA system. There are some right to right collaterals as well. The complete occlusion appears fairly short. The distal vessel bifurcates into multiple branches early.   LEFT VENTRICULOGRAM: Left ventricular angiogram was not done. LVEDP was 19 mmHg.  PCI  NARRATIVE: IV heparin and IV cangrelor were used for anticoagulation. ACT was used to check that the anticoagulation was therapeutic. Oral Brilinta was given. A CLS 4.0 catheter was used to engage left main. A pro-water wire was placed across the area disease in the LAD. A 2.25 x 15 balloon was used to predilate  with several inflations. A 2.5 x 38 Synergy drug-eluting stent was then deployed at high pressure. The stent was postdilated with a 2.75 x 15 noncompliant balloon. Intracoronary nitroglycerin was given. There was an excellent angiographic result with no residual stenosis. TIMI-3 flow was maintained throughout. The patient tolerated the procedure well.  IMPRESSIONS:  1. Widely patent left main coronary artery. 2. Severe disease in the mid left anterior descending artery which was successfully treated with a 2.5 x 38 Synergy drug-eluting stent, postdilated to 2.8 mm in diameter. 3. Mild to moderate disease in the left circumflex artery and its branches. 4. Occluded posterior lateral branch of the right coronary artery. 5. Severely decreased left ventricular systolic function by prior noninvasive study. LVEDP 19 mmHg. Ejection fractinot assessed on this study  Treatments: See Hospital Course  Discharge Exam: Blood pressure 168/69, pulse 110, temperature 98 F (36.7 C), temperature source Oral, resp. rate 18, height 5\' 10"  (1.778 m), weight 165 lb 5.5 oz (75 kg), SpO2 95 %.   Disposition: 01-Home or Self Care      Discharge Instructions    Diet - low sodium heart healthy    Complete by:  As directed      Increase activity slowly    Complete by:  As directed             Medication List    STOP taking these medications        traMADol 50 MG tablet  Commonly known as:  ULTRAM      TAKE these medications        acetaminophen 500 MG tablet  Commonly known as:  TYLENOL  Take 500 mg by mouth every 6 (six) hours as needed for mild pain or headache.     amLODipine 10 MG tablet  Commonly known as:  NORVASC  Take 10 mg by mouth daily.     aspirin 81 MG tablet  Take 81 mg by mouth daily.     calcium acetate 667 MG capsule  Commonly known as:  PHOSLO  Take 1,334 mg by mouth 3 (three) times daily with meals.     carvedilol 12.5 MG tablet  Commonly known as:  COREG  Take 1  tablet (12.5 mg total) by mouth 2 (two) times daily with a meal.     esomeprazole 20 MG capsule  Commonly known as:  NEXIUM  Take 20 mg by mouth daily at 12 noon.     gentamicin cream 0.1 %  Commonly known as:  GARAMYCIN  Apply 1 application topically 2 (two) times daily.     glipiZIDE 2.5 MG 24 hr tablet  Commonly known as:  GLUCOTROL XL  Take 2.5 mg by mouth daily as needed (diabetes).     ipratropium 0.06 % nasal spray  Commonly known as:  ATROVENT  Place 2 sprays into both nostrils as needed for rhinitis.     losartan 25 MG tablet  Commonly known as:  COZAAR  Take 1 tablet (25 mg total) by mouth daily.     multivitamin Tabs tablet  Take 1 tablet by mouth daily.     mupirocin cream 2 %  Commonly known as:  BACTROBAN  Apply 1 application topically as needed (for skin sores in the knee area).     ticagrelor 90 MG Tabs tablet  Commonly known as:  BRILINTA  Take 1 tablet (90 mg total) by mouth 2 (two) times daily.     ticagrelor 90 MG Tabs tablet  Commonly known as:  BRILINTA  Take 1 tablet (90 mg total) by mouth 2 (two) times daily.     triamcinolone cream 0.1 %  Commonly known as:  KENALOG  Apply 1 application topically 2 (two) times daily. Apply to knee and lower leg.       Follow-up Information    Follow up with Sueanne Margarita, MD.   Specialty:  Cardiology   Why:  our office will call you with an appointment   Contact information:   [address] [place] [address] [phone]       TIME SPENT ON DISCHARGE, INCLUDING PHYSICIAN TIME: >30 MINUTES   Signed: Lyda Jester 08/04/2014, 9:10 AM  Attending Note:   The patient was seen and examined.  Agree with assessment and plan as noted above.  Changes made to the above note as needed.  He has done very well following his PCI of the mid LAD with a DES.   Will need Brilinta for a year and possibly longer.   Has an old occlusion of the PL branch.   We could consider CTO PCI of the  distal right coronary if he has further angina.    Thayer Headings, Brooke Bonito., MD, [facility] 08/04/2014, 9:10 AM [address]  [place] Pager [phone]

## 2014-08-03 NOTE — Care Management Note (Unsigned)
    Page 1 of 1   08/03/2014     4:30:59 PM CARE MANAGEMENT NOTE 08/03/2014  Patient:  Victor Lane, Victor Lane   Account Number:  [id number]  Date Initiated:  08/03/2014  Documentation initiated by:  Jusitn Salsgiver  Subjective/Objective Assessment:   Pt s/p PCI.     Action/Plan:   Pt to dc on Brilinta therapy.   Anticipated DC Date:  08/04/2014   Anticipated DC Plan:  [place]  CM consult      Choice offered to / List presented to:             Status of service:  Completed, signed off Medicare Important Message given?   (If response is "NO", the following Medicare IM given date fields will be blank) Date Medicare IM given:   Medicare IM given by:   Date Additional Medicare IM given:   Additional Medicare IM given by:    Discharge Disposition:  HOME/SELF CARE  Per UR Regulation:  Reviewed for med. necessity/level of care/duration of stay  If discussed at [place] of Stay Meetings, dates discussed:    Comments:  08/03/14 Ellan Lambert, RN, BSN [phone]  S/W [facility] [place]  # [phone]  BRILINTA 89 MG 30 [place] CO-PAY-$ 45.00 PRIOR APPROVAL- NO PHARMACY - ANT RETAIL  Brilinta booklet given to pt with 30 day free trial card.

## 2014-08-03 NOTE — H&P (View-Only) (Signed)
Expand All Collapse All      Cardiology Office Note   Date: 07/30/2014   ID: Victor Lane, Victor Lane 05-Mar-1946, MRN [id number]  PCP: [name], MD Cardiologist: Sueanne Margarita, MD   Chief Complaint  Patient presents with  . Coronary Artery Disease  . Cardiomyopathy  . Hypertension  . Hyperlipidemia     History of Present Illness: 69 year old with extensive PMH including ESRD/stage 4 CKD with HD on M/W/F, w/ PD catheter placement,chronic systolic CHF EF 51-88%, severe CAD with mid RCA occlusion, distal LAD stenosis, moderate disease of the left circ by cath 07/2013 and mdical therapy was recommended. He also has paroxysmal atrial flutter. His CHADS VASC score is 4 but it was recommended that he not be anticoagulated due to weakness with frequent falls at home, especially since this was his first episode of PAflutter in the setting of acute influenza with respiratory failure. He is now on Peritoneal dialysis and is doing well with that. He recently had a 2D echo to followup on AI and his LVF has significantly declined and is now 20-25%. He also was noted to have progression of his carotid artery stenosis now with >80% stenosis of the right carotid artery and has been referred to vascular surgery. He has refused statin therapy in the past and was referred to lipid clinic in January but was not seen. He denies any chest pain or pressure.He occasionally has some DOE depending on how much removal he does with PD and some with exertion. He says his LE edema has resolved. He denies any palpitations or dizziness.    Past Medical History  Diagnosis Date  . Neuropathy   . Arthritis   . H/O vitamin D deficiency   . Hearing loss   . Hypercholesteremia   . 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 S/P radiation therapy 01/24/2013-03/14/2013    Larynx/glottis / 63 Gy in  28 fractions  . Hypertension   . GERD (gastroesophageal reflux disease)   . Complication of anesthesia     hard to wake up and then panics waking up  . Sleep apnea     to request from Dr.Fried was done about 41yrs ago but no cpap d/t weight loss  . Fibromyalgia     takes tramadol daily as needed  . Myalgia and myositis   . Peripheral neuropathy   . History of gout   . History of colon polyps   . ESRD on dialysis     - Stage 5- follwed by Dr Sharlotte Alamo in [place]  . Diabetes mellitus   . Cataracts, bilateral   . COPD (chronic obstructive pulmonary disease)   . Dilated aortic root   . Aortic valve sclerosis   . Diastolic dysfunction   . Coronary artery disease     severe CAD with mid RCA occlusion, distal LAD stenosis, moderate disease of the left circ. Medical therapy was recommended.  . Aortic regurgitation   . Carotid artery occlusion     s/p left CEA with 40-59% restenosis and >80% stenosis of the Right ICA - followed by Dr. Kellie Simmering    Past Surgical History  Procedure Laterality Date  . Cholecystectomy  1993  . Rotator cuff repair  07/06/2011    right  . Carotid endarterectomy Left   . Coolonoscopy    . Colonoscopy w/ biopsies and polypectomy      Hx: of  .  Microlaryngoscopy with laser N/A 01/04/2013    Procedure: MICROLARYNGOSCOPY WITH BIOPSY/LASER; Surgeon: [name], MD; Location: [place]; Service: ENT; Laterality: N/A;  . Av fistula placement Left 02/23/2013    Procedure: ARTERIOVENOUS (AV) FISTULA CREATION; Surgeon: Conrad [place], MD; Location: [place]; Service: Vascular; Laterality: Left;  . Capd insertion N/A 08/01/2013    Procedure: LAPAROSCOPIC INSERTION CONTINUOUS AMBULATORY PERITONEAL DIALYSIS CATHETER; Surgeon: [name], MD; Location: [place] [facility]; Service: General; Laterality: N/A;  . Laparoscopic abdominal exploration N/A  08/01/2013    Procedure: LAPAROSCOPIC ABDOMINAL EXPLORATION; Surgeon: [name], MD; Location: [place]; Service: General; Laterality: N/A;  . Ulnar nerve transposition Left 02/22/2014    Procedure: LEFT ULNAR NERVE RELEASE AND OR TRANSPOSITION; Surgeon: [name], MD; Location: [place]; Service: Orthopedics; Laterality: Left;  . Left heart catheterization with coronary angiogram N/A 08/14/2013    Procedure: LEFT HEART CATHETERIZATION WITH CORONARY ANGIOGRAM; Surgeon: [name], MD; Location: [facility] CATH LAB; Service: Cardiovascular; Laterality: N/A;     Current Outpatient Prescriptions  Medication Sig Dispense Refill  . acetaminophen (TYLENOL) 500 MG tablet Take 500 mg by mouth every 6 (six) hours as needed.    Marland Kitchen amLODipine (NORVASC) 10 MG tablet Take 10 mg by mouth daily.    Marland Kitchen aspirin 81 MG tablet Take 81 mg by mouth daily.     . calcium acetate (PHOSLO) 667 MG capsule Take 1,334 mg by mouth 3 (three) times daily with meals.    Marland Kitchen gentamicin cream (GARAMYCIN) 0.1 % Apply 1 application topically 2 (two) times daily.  99  . glipiZIDE (GLUCOTROL XL) 2.5 MG 24 hr tablet Take 2.5 mg by mouth daily as needed (diabetes).    Marland Kitchen ipratropium (ATROVENT) 0.06 % nasal spray Place 2 sprays into both nostrils as needed for rhinitis.    . metoprolol succinate (TOPROL-XL) 50 MG 24 hr tablet Take 50 mg by mouth daily. Take with or immediately following a meal.    . multivitamin (RENA-VIT) TABS tablet Take 1 tablet by mouth daily.    . mupirocin cream (BACTROBAN) 2 % Apply 1 application topically as needed (for skin sores in the knee area).     Marland Kitchen omeprazole (PRILOSEC) 20 MG capsule Take 20 mg by mouth daily.    . traMADol (ULTRAM) 50 MG tablet Take 1-2 tablets (50-100 mg total) by mouth every 6 (six) hours as needed for moderate pain or severe pain. 40 tablet 0  . triamcinolone cream (KENALOG) 0.1 % Apply 1  application topically 2 (two) times daily. Apply to knee and lower leg.     No current facility-administered medications for this visit.    Allergies: Amoxicillin; Fentanyl; Moxifloxacin; and Nsaids    Social History: The patient  reports that he quit smoking about 18 years ago. His smoking use included Cigarettes. He has a 62 pack-year smoking history. He has never used smokeless tobacco. He reports that he does not drink alcohol or use illicit drugs.   Family History: The patient's family history is not on file. He was adopted.    ROS: Please see the history of present illness. Otherwise, review of systems are positive for none. All other systems are reviewed and negative.    PHYSICAL EXAM: VS: BP 122/60 mmHg  Pulse 83  Ht 5\' 10"  (1.778 m)  Wt 177 lb (80.287 kg)  BMI 25.40 kg/m2 , BMI Body mass index is 25.4 kg/(m^2). GEN: Well nourished, well developed, in no acute distress  HEENT: normal  Neck: no JVD, carotid  bruits, or masses Cardiac: RRR; no murmurs, rubs, or gallops, Trace edema  Respiratory: Crackles at bases GI: soft, nontender, nondistended, + BS MS: no deformity or atrophy  Skin: warm and dry, no rash Neuro: Strength and sensation are intact Psych: euthymic mood, full affect   EKG: EKG was ordered today and showed NSR with nonspecific ST/T wave abnormality which is new from EKG 3/15    Recent Labs: 08/02/2013: Magnesium 2.1 08/29/2013: ALT 15 08/30/2013: BUN 54*; Creatinine 5.64*; Platelets 206 02/22/2014: Hemoglobin 13.6; Potassium 4.1; Sodium 134* 06/01/2014: TSH 1.643    Lipid Panel  Labs (Brief)       Component Value Date/Time   TRIG 434* 02/15/2013 0500       Wt Readings from Last 3 Encounters:  07/30/14 177 lb (80.287 kg)  06/01/14 176 lb 11.2 oz (80.151 kg)  05/11/14 173 lb (78.472 kg)    ASSESSMENT AND PLAN:  1. Chronic combined systolic/diastolic CHF NYHA class II - He has some crackles on exam and  LE edema today. I will check a BNP. Given his worsening LVF I will stop his Toprol and start Coreg 6.25mg  BID. I will also start Losartan 50mg  daily. His volume is managed by peritoneal dialysis.  2.Severe LV dysfunction EF 20-25% - this is new. His EF last year was 40-45% by echo 07/2013. ? Etiology of decline in EF. Possibly due to progression of CAD.  - med changes as above. Plan left heart Friday to redefine coronary anatomy and possible CVTS consult pending results although his underlying comorbidities may make him a poor candidate.  3. Paroxysmal atrial flutter - one episode in the setting of acute influenza. His CHADS VASC score is 4. He was not sent home on anticoagulation due to increased weakness and falls at home. Event monitor did not show any further PAF. Continue BB 4. Mild AI - by echo 07/2014 5. 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 HTN well controlled. Continue amlodipine. Changing to ARB and Coreg for DCM. 7. Carotid artery stenosis with bilateral bruits - recent dopplers showed the right carotid artery is >80% stenosed and left is 40-59% in prior endarterectomy - he has been referred back to vascular surgery - I will set him up with Dr. Kellie Simmering ASAP.  8. Dyslipidemia - he refused statin therapy and was referred to lipid clinic but did not come in January. Will reschedule to see if he is a candidate for PCSK 9 drug. I stressed the importance that he get on medical therapy for his hyperlipidemia.  9. Severe CAD with mid RCA occlusion, distal LAD stenosis, moderate disease of the left circ. Medical therapy was recommended. He is not having any angin but has had worsening of his LVF. I have recommended that we proceed with left heart catheterization to evaluate coronary anatomy. He has not been felt to be a good candidate for revascularization in the past due to  his comorbidities but with worsening LVF, if he has progression of CAD, will need to get input from CVTS to see if he is a candidate for CABG.   I will have him seen in 1 week with the PA to make sure he is tolerating his meds ok.   Current medicines are reviewed at length with the patient today. The patient does not have concerns regarding medicines.  The following changes have been made: D/c of Toprol. Start Coreg 6.25mg   BID and Losartan 50mg  daily. I have asked him to check his BP and HR daily for a week and call.   Labs/ tests ordered today include: BMET, BNP     Disposition: FU with me in 3 months   Signed, Sueanne Margarita, MD  07/30/2014 11:25 AM  [facility] [address], [place], [place] [address] Phone: [phone]; Fax: [phone]

## 2014-08-03 NOTE — Interval H&P Note (Signed)
Cath Lab Visit (complete for each Cath Lab visit)  Clinical Evaluation Leading to the Procedure:   ACS: No.  Non-ACS:    Anginal Classification: CCS II  Anti-ischemic medical therapy: Maximal Therapy (2 or more classes of medications)  Non-Invasive Test Results: High-risk stress test findings: cardiac mortality >3%/year  Prior CABG: No previous CABG  Ischemic Symptoms? CCS II (Slight limitation of ordinary activity) Anti-ischemic Medical Therapy? Maximal Medical Therapy (2 or more classes of medications) Non-invasive Test Results? High-risk stress test findings: cardiac mortality >3%/yr Prior CABG? No Previous CABG   Patient Information:   1-2V CAD, no prox LAD  A (8)  Indication: 19; Score: 8   Patient Information:   CTO of 1 vessel, no other CAD  A (7)  Indication: 29; Score: 7   Patient Information:   1V CAD with prox LAD  A (9)  Indication: 35; Score: 9   Patient Information:   2V-CAD with prox LAD  A (9)  Indication: 41; Score: 9   Patient Information:   3V-CAD without LMCA  A (9)  Indication: 47; Score: 9   Patient Information:   3V-CAD without LMCA With Abnormal LV systolic function  A (9)  Indication: 48; Score: 9   Patient Information:   LMCA-CAD  A (9)  Indication: 49; Score: 9   Patient Information:   2V-CAD with prox LAD PCI  A (7)  Indication: 62; Score: 7   Patient Information:   2V-CAD with prox LAD CABG  A (8)  Indication: 62; Score: 8   Patient Information:   3V-CAD without LMCA With Low CAD burden(i.e., 3 focal stenoses, low SYNTAX score) PCI  A (7)  Indication: 63; Score: 7   Patient Information:   3V-CAD without LMCA With Low CAD burden(i.e., 3 focal stenoses, low SYNTAX score) CABG  A (9)  Indication: 63; Score: 9   Patient Information:   3V-CAD without LMCA E06c - Intermediate-high CAD burden (i.e., multiple diffuse lesions, presence of CTO, or high SYNTAX score) PCI  U (4)   Indication: 64; Score: 4   Patient Information:   3V-CAD without LMCA E06c - Intermediate-high CAD burden (i.e., multiple diffuse lesions, presence of CTO, or high SYNTAX score) CABG  A (9)  Indication: 64; Score: 9   Patient Information:   LMCA-CAD With Isolated LMCA stenosis  PCI  U (6)  Indication: 65; Score: 6   Patient Information:   LMCA-CAD With Isolated LMCA stenosis  CABG  A (9)  Indication: 65; Score: 9   Patient Information:   LMCA-CAD Additional CAD, low CAD burden (i.e., 1- to 2-vessel additional involvement, low SYNTAX score) PCI  U (5)  Indication: 66; Score: 5   Patient Information:   LMCA-CAD Additional CAD, low CAD burden (i.e., 1- to 2-vessel additional involvement, low SYNTAX score) CABG  A (9)  Indication: 66; Score: 9   Patient Information:   LMCA-CAD Additional CAD, intermediate-high CAD burden (i.e., 3-vessel involvement, presence of CTO, or high SYNTAX score) PCI  I (3)  Indication: 67; Score: 3   Patient Information:   LMCA-CAD Additional CAD, intermediate-high CAD burden (i.e., 3-vessel involvement, presence of CTO, or high SYNTAX score) CABG  A (9)  Indication: 67; Score: 9     History and Physical Interval Note:  08/03/2014 10:22 AM  Victor Lane  has presented today for surgery, with the diagnosis of aortic stenosis, cad  The various methods of treatment have been discussed with the patient and family. After consideration of  risks, benefits and other options for treatment, the patient has consented to  Procedure(Lane): LEFT AND RIGHT HEART CATHETERIZATION WITH CORONARY ANGIOGRAM (N/A) as a surgical intervention .  The patient'Lane history has been reviewed, patient examined, no change in status, stable for surgery.  I have reviewed the patient'Lane chart and labs.  Questions were answered to the patient'Lane satisfaction.     Victor Lane.

## 2014-08-03 NOTE — CV Procedure (Signed)
PROCEDURE:  Left heart catheterization with selective coronary angiography, PCI LAD.  INDICATIONS:  Cardiomyopathy, coronary artery disease  The risks, benefits, and details of the procedure were explained to the patient.  The patient verbalized understanding and wanted to proceed.  Informed written consent was obtained.  PROCEDURE TECHNIQUE:  After Xylocaine anesthesia a 69F sheath was placed in the right femoral artery with a single anterior needle wall stick.   Left coronary angiography was done using a Judkins L4 guide catheter.  Right coronary angiography was done using a Judkins R4 guide catheter.  Left heart catheterization was done using a pigtail catheter.    CONTRAST:  Total of 135 cc.  COMPLICATIONS:  None.    HEMODYNAMICS:  Aortic pressure was 160/68; LV pressure was 159/13; LVEDP 19.  There was no gradient between the left ventricle and aorta.    ANGIOGRAPHIC DATA:   The left main coronary artery is a short vessel but patent.  The left anterior descending artery is a large vessel which wraps around the apex. There is mild diffuse atherosclerosis. There are 2 large diagonals. The first diagonal is widely patent. The second diagonal has moderate ostial stenosis. Just past the second diagonal, there is moderate atherosclerosis. There is a segment of diffuse disease which terminates in a focal area of severe disease, up to 90% and calcified. The remainder of the LAD appears widely patent. There are left to right collaterals feeding the distal RCA system.  The left circumflex artery is a large vessel. There is a moderate lesion proximally. There is a large first obtuse marginal which has mild disease. In the remainder of the circumflex, there is mild diffuse disease. The second significant obtuse marginal is patent.  The right coronary artery is patent proximally. The bifurcation appears to occur early. Just after the bifurcation, there is a PDA stenosis. The posterior lateral artery  is occluded. There are left to right collaterals which fill the distal RCA system. There are some right to right collaterals as well. The complete occlusion appears fairly short. The distal vessel bifurcates into multiple branches early.    LEFT VENTRICULOGRAM:  Left ventricular angiogram was not done.  LVEDP was 19 mmHg.  PCI NARRATIVE: IV heparin and IV cangrelor were used for anticoagulation. ACT was used to check that the anticoagulation was therapeutic. Oral Brilinta was given. A CLS 4.0 catheter was used to engage left main. A pro-water wire was placed across the area disease in the LAD. A 2.25 x 15 balloon was used to predilate with several inflations. A 2.5 x 38 Synergy drug-eluting stent was then deployed at high pressure. The stent was postdilated with a 2.75 x 15 noncompliant balloon. Intracoronary nitroglycerin was given. There was an excellent angiographic result with no residual stenosis. TIMI-3 flow was maintained throughout. The patient tolerated the procedure well.  IMPRESSIONS:  1. Widely patent left main coronary artery. 2. Severe disease in the mid left anterior descending artery which was successfully treated with a 2.5 x 38 Synergy drug-eluting stent, postdilated to 2.8 mm in diameter. 3. Mild to moderate disease in the left circumflex artery and its branches. 4. Occluded posterior lateral branch of the right coronary artery. 5. Severely decreased left ventricular systolic function by prior noninvasive study.  LVEDP 19 mmHg.  Ejection fractinot assessed on this study  RECOMMENDATION:  Continue dual antiplatelet therapy for at least a year and likely longer. It would be reasonable to change the the patient's antiplatelet therapy to Plavix down the  road. Kary Kos was used here for quick antiplatelet action since we are using heparin for anticoagulation during the procedure. He will get a free month of Brilinta.  Continue aggressive secondary prevention. I suspect his LAD lesion  was compromising both his anterior and inferior walls due to the collaterals the LAD was applying to his distal RCA territory. Hopefully, his LV function will improve with LAD revascularization. If he develops angina which is refractory to medical therapy, could consider CTO PCI of the distal RCA.  He will be watched overnight. Plan discharge tomorrow if there are no complications. Follow-up with Dr. Radford Pax.

## 2014-08-04 DIAGNOSIS — I5022 Chronic systolic (congestive) heart failure: Secondary | ICD-10-CM

## 2014-08-04 DIAGNOSIS — E785 Hyperlipidemia, unspecified: Secondary | ICD-10-CM | POA: Diagnosis not present

## 2014-08-04 DIAGNOSIS — I2510 Atherosclerotic heart disease of native coronary artery without angina pectoris: Secondary | ICD-10-CM | POA: Diagnosis not present

## 2014-08-04 DIAGNOSIS — I429 Cardiomyopathy, unspecified: Secondary | ICD-10-CM | POA: Diagnosis not present

## 2014-08-04 LAB — GRAM STAIN

## 2014-08-04 LAB — BASIC METABOLIC PANEL
Anion gap: 14 (ref 5–15)
BUN: 42 mg/dL — ABNORMAL HIGH (ref 6–23)
CO2: 24 mmol/L (ref 19–32)
CREATININE: 9.32 mg/dL — AB (ref 0.50–1.35)
Calcium: 8.5 mg/dL (ref 8.4–10.5)
Chloride: 100 mmol/L (ref 96–112)
GFR calc non Af Amer: 5 mL/min — ABNORMAL LOW (ref >90)
GFR, EST AFRICAN AMERICAN: 6 mL/min — AB (ref >90)
Glucose, Bld: 158 mg/dL — ABNORMAL HIGH (ref 70–99)
Potassium: 3.7 mmol/L (ref 3.5–5.1)
SODIUM: 138 mmol/L (ref 135–145)

## 2014-08-04 LAB — BODY FLUID CELL COUNT WITH DIFFERENTIAL: Total Nucleated Cell Count, Fluid: 10 cu mm (ref 0–1000)

## 2014-08-04 LAB — CBC
HCT: 31.3 % — ABNORMAL LOW (ref 39.0–52.0)
Hemoglobin: 10 g/dL — ABNORMAL LOW (ref 13.0–17.0)
MCH: 33.4 pg (ref 26.0–34.0)
MCHC: 31.9 g/dL (ref 30.0–36.0)
MCV: 104.7 fL — ABNORMAL HIGH (ref 78.0–100.0)
Platelets: 188 10*3/uL (ref 150–400)
RBC: 2.99 MIL/uL — AB (ref 4.22–5.81)
RDW: 16.9 % — ABNORMAL HIGH (ref 11.5–15.5)
WBC: 6.4 10*3/uL (ref 4.0–10.5)

## 2014-08-04 LAB — GLUCOSE, CAPILLARY: GLUCOSE-CAPILLARY: 146 mg/dL — AB (ref 70–99)

## 2014-08-04 MED ORDER — TICAGRELOR 90 MG PO TABS: 90.0000 mg | ORAL_TABLET | Freq: Two times a day (BID) | ORAL | Status: DC

## 2014-08-04 MED ORDER — CARVEDILOL 12.5 MG PO TABS: 12.5000 mg | ORAL_TABLET | Freq: Two times a day (BID) | ORAL | Status: DC

## 2014-08-04 MED ORDER — VANCOMYCIN HCL 10 G IV SOLR
1500.0000 mg | Freq: Once | INTRAVENOUS | Status: AC
  Administered 2014-08-04: 1500 mg via INTRAVENOUS
  Filled 2014-08-04: qty 1500

## 2014-08-04 NOTE — Progress Notes (Signed)
[id number] Did not walk pt as he is connected for PD. Reviewed stent and importance of brilinta and aspirin, NTG use, ex ed and will refer diet instruction to [facility] as pt stated he follows closely with them. Discussed CRP 2 but pt wanted to exercise on his own and declined program. Staff can walk when pt disconnected. Graylon Good RN BSN 08/04/2014 8:48 AM

## 2014-08-04 NOTE — Progress Notes (Signed)
  [facility] Progress Note   Subjective: still having some intermittent mild abd discomfort, no n/v/d.  Fibrin clumps in PD fluid  Filed Vitals:   08/03/14 2344 08/04/14 0100 08/04/14 0642 08/04/14 0758  BP: 140/47  161/69 168/69  Pulse: 105  105 110  Temp: 99.2 F (37.3 C)  98.2 F (36.8 C) 98 F (36.7 C)  TempSrc: Oral  Oral Oral  Resp: 16  18 18   Height:      Weight:  75 kg (165 lb 5.5 oz)    SpO2: 91%  93% 95%   Exam: Pleasant, no disrtress No rash, cyanosis or gangrene Sclera anicteric, throat clear No jvd Chest clear bilat RRR no MRG Abd soft, NTND, LLL PD cath in place Trace LE edema pretib on L, R w no edma Neuro is alert, o x 3, nf  PD: Hangs 3 5-L bags of 2.5% at 9pm. Does 4 exchanges (2500 cc each) of 2.5% every night and then leaves 1000cc in for a daybag. No pause/ mid-day exchange.    Assessment: 1. LV dysfunction - heart cath not found to have any sig cor artery disease 2. Abd discomfort / fibrin clumping in PD fluid - fluid grossly clear but fibrin may be sign of early peritonitis 3. ESRD on PD 4. HTN on norvasc, coreg, losartan 5. 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  He can be dc'd after Vanc is in and we will f/u the results of his PD fluid studies and give further abx if needed in the OP setting. Have d/w pt and his wife.       Kelly Splinter MD  pager [phone]    cell [phone]  08/04/2014, 10:58 AM     Recent Labs Lab 07/30/14 1234 08/04/14 0412  NA 137 138  K 4.1 3.7  CL 94* 100  CO2 27 24  GLUCOSE 95 158*  BUN 63* 42*  CREATININE 9.46* 9.32*  CALCIUM 9.4 8.5   No results for input(s): AST, ALT, ALKPHOS, BILITOT, PROT, ALBUMIN in the last 168 hours.  Recent Labs Lab 07/30/14 1234 08/04/14 0412  WBC 8.2 6.4  NEUTROABS 6.1  --   HGB 11.6* 10.0*  HCT 34.1* 31.3*  MCV 98.3 104.7*  PLT 300.0 188   . amLODipine  10 mg Oral Daily  .  aspirin  81 mg Oral Daily  . calcium acetate  1,334 mg Oral TID WC  . carvedilol  6.25 mg Oral BID WC  . gentamicin cream  1 application Topical BID  . losartan  25 mg Oral Daily  . pantoprazole  40 mg Oral Daily  . ticagrelor  90 mg Oral BID  . triamcinolone cream  1 application Topical BID   . dialysis solution 2.5% low-MG/low-CA     acetaminophen, bismuth subsalicylate, glipiZIDE, ipratropium, mupirocin cream, ondansetron (ZOFRAN) IV, traMADol

## 2014-08-04 NOTE — Progress Notes (Signed)
ANTIBIOTIC CONSULT NOTE - INITIAL  Pharmacy Consult for Vancomycin load Indication: PD catheter related peritonitis  Allergies  Allergen Reactions  . Amoxicillin     Causes upset stomach  . Fentanyl     Confusion when patch applied  . Moxifloxacin Other (See Comments)    unknown  . Nsaids Other (See Comments)    Severe kidney disease/ESRD    Patient Measurements: Height: 5\' 10"  (177.8 cm) Weight: 165 lb 5.5 oz (75 kg) IBW/kg (Calculated) : 73  Vital Signs: Temp: 98 F (36.7 C) (02/13 0758) Temp Source: Oral (02/13 0758) BP: 168/69 mmHg (02/13 0758) Pulse Rate: 110 (02/13 0758) Intake/Output from previous day: 02/12 0701 - 02/13 0700 In: 480 [P.O.:480] Out: 200 [Urine:200] Intake/Output from this shift: Total I/O In: 340 [P.O.:340] Out: 200 [Urine:200]  Labs:  Recent Labs  08/04/14 0412  WBC 6.4  HGB 10.0*  PLT 188  CREATININE 9.32*   Estimated Creatinine Clearance: 7.8 mL/min (by C-G formula based on Cr of 9.32). No results for input(s): VANCOTROUGH, VANCOPEAK, VANCORANDOM, GENTTROUGH, GENTPEAK, GENTRANDOM, TOBRATROUGH, TOBRAPEAK, TOBRARND, AMIKACINPEAK, AMIKACINTROU, AMIKACIN in the last 72 hours.   Microbiology: Recent Results (from the past 720 hour(s))  Stat Gram stain     Status: None   Collection Time: 08/04/14 10:03 AM  Result Value Ref Range Status   Specimen Description FLUID PERITONEAL  Final   Special Requests NONE  Final   Gram Stain   Final    CYTOSPIN SLIDE WBC PRESENT,BOTH PMN AND MONONUCLEAR NO ORGANISMS SEEN    Report Status 08/04/2014 FINAL  Final    Medical History: Past Medical History  Diagnosis Date  . Neuropathy   . H/O vitamin D deficiency   . Hearing loss   . Hypercholesteremia   . 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 S/P radiation therapy 01/24/2013-03/14/2013    Larynx/glottis / 63 Gy in 28 fractions  . Hypertension   . GERD (gastroesophageal reflux  disease)   . Sleep apnea     to request from Dr.Fried was done about 67yrs ago but no cpap d/t weight loss  . Fibromyalgia     takes tramadol daily as needed  . Myalgia and myositis   . Peripheral neuropathy   . History of colon polyps   . Cataracts, bilateral   . Dilated aortic root   . Aortic valve sclerosis   . Diastolic dysfunction   . Coronary artery disease     severe CAD with mid RCA occlusion, distal LAD stenosis, moderate disease of the left circ. Medical therapy was recommended.  . Aortic regurgitation   . Carotid artery occlusion     s/p left CEA with 40-59% restenosis and >80% stenosis of the Right ICA - followed by Dr. Kellie Simmering  . Complication of anesthesia     hard to wake up and then panics waking up  . CHF (congestive heart failure)   . ESRD on dialysis 01/2013    - Stage 5- follwed by Dr Sharlotte Alamo in [place]  . ESRD on peritoneal dialysis 09/2013    "followed by [name]; [facility]" (08/03/2014)  . Type II diabetes mellitus     "diet controlled now" (08/03/2014)  . Arthritis     "just age related" (08/03/2014)    Medications:  Scheduled:  . amLODipine  10 mg Oral Daily  . aspirin  81 mg Oral Daily  . calcium acetate  1,334 mg Oral  TID WC  . carvedilol  6.25 mg Oral BID WC  . gentamicin cream  1 application Topical BID  . losartan  25 mg Oral Daily  . pantoprazole  40 mg Oral Daily  . ticagrelor  90 mg Oral BID  . triamcinolone cream  1 application Topical BID  . vancomycin  1,500 mg Intravenous Once   Infusions:  . dialysis solution 2.5% low-MG/low-CA     Assessment: 69 yo M admitted on 08/03/2014 for planned LHC. Subsequently found to have intermittent mild abdominal discomfort with fibrin clumps in PD fluid. Pharmacy consulted to load patient with vancomycin prior to discharge for possible early PD catheter related peritonitis.   Goal of Therapy:  Re-dose when vancomycin level < 20 mg/L  Plan:  - Vancomycin 1500 mg IV x 1 - Monitor  temp, WBC, s/s of resolution - F/u serum vancomycin level in 3-4 days and re-dose when level is < 20 mg/L  Yared Susan K. Velva Harman, PharmD Clinical Pharmacist - Resident Pager: [phone] Pharmacy: [phone] 08/04/2014 11:35 AM

## 2014-08-04 NOTE — Progress Notes (Addendum)
Patient Profile: 69 year old with extensive PMH including ESRD/stage 4 CKD with HD on M/W/F, w/ PD catheter placement, chronic systolic CHF, severe CAD and paroxsymal atrial flutter admitted for LHC in the setting of worsening LV function. EF changed from 40-45% down to 20-25% on most recent echo   Subjective: Chest pain free. Denies groin, flank or low back pain. Ambulating w/o difficutly  Objective: Vital signs in last 24 hours: Temp:  [97.1 F (36.2 C)-99.3 F (37.4 C)] 98.2 F (36.8 C) (02/13 0642) Pulse Rate:  [87-105] 105 (02/13 0642) Resp:  [16-20] 18 (02/13 0642) BP: (135-161)/(47-72) 161/69 mmHg (02/13 0642) SpO2:  [91 %-98 %] 93 % (02/13 0642) Weight:  [165 lb (74.844 kg)-165 lb 5.5 oz (75 kg)] 165 lb 5.5 oz (75 kg) (02/13 0100) Last BM Date: 08/02/14  Intake/Output from previous day: 02/12 0701 - 02/13 0700 In: 480 [P.O.:480] Out: 200 [Urine:200] Intake/Output this shift:    Medications Current Facility-Administered Medications  Medication Dose Route Frequency Provider Last Rate Last Dose  . acetaminophen (TYLENOL) tablet 650 mg  650 mg Oral Q4H PRN [name], MD   650 mg at 08/04/14 [phone]  . amLODipine (NORVASC) tablet 10 mg  10 mg Oral Daily [name], MD      . aspirin chewable tablet 81 mg  81 mg Oral Daily [name], MD   0 mg at 08/03/14 1643  . bismuth subsalicylate (PEPTO BISMOL) 262 MG/15ML suspension 30 mL  30 mL Oral Q4H PRN Eileen Stanford, PA-C   30 mL at 08/03/14 2146  . calcium acetate (PHOSLO) capsule 1,334 mg  1,334 mg Oral TID WC [name], MD   1,334 mg at 08/03/14 1640  . carvedilol (COREG) tablet 6.25 mg  6.25 mg Oral BID WC [name], MD   6.25 mg at 08/03/14 1640  . dialysis solution 2.5% low-MG/low-CA (DELFLEX) CAPD   Intraperitoneal Continuous Sol Blazing, MD      . gentamicin cream (GARAMYCIN) 0.1 % 1 application  1 application Topical BID [name], MD   1 application at  37/34/28 2200  . glipiZIDE (GLUCOTROL XL) 24 hr tablet 2.5 mg  2.5 mg Oral Daily PRN [name], MD      . ipratropium (ATROVENT) 0.06 % nasal spray 2 spray  2 spray Each Nare BID PRN Raliegh Ip, MD   2 spray at 08/03/14 2200  . losartan (COZAAR) tablet 25 mg  25 mg Oral Daily [name], MD      . mupirocin cream (BACTROBAN) 2 % 1 application  1 application Topical PRN [name], MD      . ondansetron [facility]) injection 4 mg  4 mg Intravenous Q6H PRN [name], MD   4 mg at 08/03/14 1413  . pantoprazole (PROTONIX) EC tablet 40 mg  40 mg Oral Daily [name], MD   40 mg at 08/03/14 1638  . ticagrelor (BRILINTA) tablet 90 mg  90 mg Oral BID [name], MD   90 mg at 08/03/14 2313  . traMADol (ULTRAM) tablet 50-100 mg  50-100 mg Oral Q12H PRN [name], MD   100 mg at 08/04/14 0045  . triamcinolone cream (KENALOG) 0.1 % 1 application  1 application Topical BID [name], MD   1 application at 76/81/15 1600    PE: General appearance: alert, cooperative and no distress Neck: no carotid bruit and no JVD Lungs: clear  to auscultation bilaterally Heart: tachy rate and regular and rhythm, S1, S2 normal, no murmur, click, rub or gallop Extremities: no LEE Pulses: 2+ and symmetric Skin: warm and dry Neurologic: Grossly normal  Lab Results:   Recent Labs  08/04/14 0412  WBC 6.4  HGB 10.0*  HCT 31.3*  PLT 188   BMET  Recent Labs  08/04/14 0412  NA 138  K 3.7  CL 100  CO2 24  GLUCOSE 158*  BUN 42*  CREATININE 9.32*  CALCIUM 8.5   PT/INR  Recent Labs  08/03/14 0730  LABPROT 14.1  INR 1.07   Cholesterol No results for input(s): CHOL in the last 72 hours. Cardiac Enzymes Invalid input(s): TROPONIN,  CKMB  Studies/Results:  LHC 08/03/14 IMPRESSIONS:  1. Widely patent left main coronary artery. 2. Severe disease in the mid left anterior descending artery which was successfully treated with a  2.5 x 38 Synergy drug-eluting stent, postdilated to 2.8 mm in diameter. 3. Mild to moderate disease in the left circumflex artery and its branches. 4. Occluded posterior lateral branch of the right coronary artery. 5. Severely decreased left ventricular systolic function by prior noninvasive study. LVEDP 19 mmHg. Ejection fractinot assessed on this study   Assessment/Plan  Active Problems:   Chronic systolic congestive heart failure   Coronary arteriosclerosis   Cardiomyopathy  1. CAD: results outlined in detail above. S/p PCI + DES to mid LAD. Moderate residual disease in the LCx and an occluded posterior lateral branch of the RCA. No recurrent CP. On DAPT w/ ASA + Brilinta. Continue for at least 1 year give DES. Resume BB, ARB. Currently not on statin therapy. Recommend initiation for secondary prevention. Increase Coreg to 12.5 mg for better beta blockade and additional BP control. Resting HR in the 110s. SBP in the 160s. If he develops angina which is refractory to medical therapy, could consider CTO PCI of the distal RCA.   2. S/P LHC: right femoral access site is stable. Ambulating w/o difficulty. Has CKD: currently undergoing PD.    3. Ischemic Cardiomyopathy/ Systolic CHF: EF 95-28%. Euvolemic on physical exam. Hopefully, his LV function will improve with LAD revascularization. Continue BB and ARB therapy. Recheck 2D echo in 3 months. If no improvement, consider referral to EP for possible ICD.   4. ESRD: on PD.   Dispo: likely d/c home after MD assessment. F/u with Dr. Hayden Rasmussen. Ladoris Gene 08/04/2014 7:11 AM  Attending Note:   The patient was seen and examined.  Agree with assessment and plan as noted above.  Changes made to the above note as needed.  Pt is doing very well,  See my note from earlier today. Feeling much better after PCI.  increasing coreg for tachycardia Cardiac rehab is seeing currently.   Follow up with dr. Radford Pax.  Thayer Headings,  Brooke Bonito., MD, [facility] 08/04/2014, 8:04 AM [address]  [place] Pager [phone]

## 2014-08-05 ENCOUNTER — Other Ambulatory Visit: Payer: Self-pay | Admitting: Cardiology

## 2014-08-05 MED ORDER — NITROGLYCERIN 0.4 MG SL SUBL: 0.4000 mg | SUBLINGUAL_TABLET | SUBLINGUAL | Status: DC | PRN

## 2014-08-05 MED ORDER — NITROGLYCERIN 0.4 MG SL SUBL: 0.4000 mg | SUBLINGUAL_TABLET | SUBLINGUAL | Status: AC | PRN

## 2014-08-06 ENCOUNTER — Encounter: Payer: Self-pay | Admitting: Cardiology

## 2014-08-06 ENCOUNTER — Ambulatory Visit: Payer: Medicare Other | Admitting: Physician Assistant

## 2014-08-06 NOTE — Progress Notes (Signed)
Cardiology Office Note   Date:  08/07/2014   ID:  Victor Lane, Victor Lane Jul 07, 1945, MRN [id number]  PCP:  [name], MD  Cardiologist:   Sueanne Margarita, MD   Chief Complaint  Patient presents with  . Coronary Artery Disease  . Hypertension  . Hyperlipidemia      History of Present Illness: 69 year old with extensive PMH including ESRD/stage 4 CKD with HD on M/W/F, w/ PD catheter placement,chronic systolic CHF EF 19-62%, severe CAD with mid RCA occlusion, distal LAD stenosis, moderate disease of the left circ by cath 07/2013 on medical therapy. He also has paroxysmal atrial flutter. His CHADS VASC score is 4 but it was recommended that he not be anticoagulated due to weakness with frequent falls at home, especially since this was his first episode of PAflutter in the setting of acute influenza with respiratory failure. He is now on Peritoneal dialysis and is doing well with that. He recently had a 2D echo to followup on AI and his LVF has significantly declined and is now 20-25%. He also was noted to have progression of his carotid artery stenosis now with >80% stenosis of the right carotid artery and has been referred to vascular surgery. He has refused statin therapy in the past and was referred to lipid clinic in January but did not followup. He underwent cardiac cath due to worsening LVF with CHF which showed severe disease of the mid LAD, mild to moderate disease of the left circ and occluded PL of the RCA with left to right collaterals to the distal RCA territory.  LVEDP was elevated at 33mmHg.  He underwent PCI of the mid LAD with DES and was started on Brilinta and ASA.  He now presents back today doing well.  He denies any chest pain.  He has some DOE with exercise.  He denies any palpitations, LE edema, dizziness or syncope.  Past Medical History  Diagnosis Date  . Neuropathy   . H/O vitamin D deficiency   . Hearing loss   . Hypercholesteremia   . 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 S/P radiation therapy 01/24/2013-03/14/2013    Larynx/glottis / 63 Gy in 28 fractions  . Hypertension   . GERD (gastroesophageal reflux disease)   . Sleep apnea     to request from Dr.Fried was done about 24yrs ago but no cpap d/t weight loss  . Fibromyalgia     takes tramadol daily as needed  . Myalgia and myositis   . Peripheral neuropathy   . History of colon polyps   . Cataracts, bilateral   . Dilated aortic root   . Aortic valve sclerosis   . Diastolic dysfunction   . Aortic regurgitation   . Complication of anesthesia     hard to wake up and then panics waking up  . Type II diabetes mellitus     "diet controlled now" (08/03/2014)  . Arthritis     "just age related" (08/03/2014)  . Coronary artery disease     severe CAD with mid RCA occlusion, distal LAD stenosis, moderate disease of the left circ. Repeat cath 07/2014 for worsening LVF showed severe disease of the mid LAD, mild to mod LCX disease, occluded PL of the RCA with left to right collaterals to distal RCA territory s/p PCI of the LAD with DES.  It was noted that if patient develops CP resistant  to medical therapy could consider PCI of RCA  . ESRD on peritoneal dialysis 09/2013    "followed by [name]; [facility]" (08/03/2014)  . Carotid artery occlusion     s/p left CEA with 40-59% restenosis and >80% stenosis of the Right ICA - followed by Dr. Kellie Simmering  . Chronic combined systolic and diastolic CHF (congestive heart failure)     Past Surgical History  Procedure Laterality Date  . Cholecystectomy  1993  . Shoulder open rotator cuff repair Right 07/06/2011  . Carotid endarterectomy Left   . Colonoscopy w/ biopsies and polypectomy      Hx: of  . Microlaryngoscopy with laser N/A 01/04/2013    Procedure: MICROLARYNGOSCOPY WITH BIOPSY/LASER;  Surgeon: [name], MD;  Location: [place];  Service: ENT;  Laterality: N/A;  . Av fistula  placement Left 02/23/2013    Procedure: ARTERIOVENOUS (AV) FISTULA CREATION;  Surgeon: Conrad [place], MD;  Location: [place];  Service: Vascular;  Laterality: Left;  . Capd insertion N/A 08/01/2013    Procedure: LAPAROSCOPIC INSERTION CONTINUOUS AMBULATORY PERITONEAL DIALYSIS CATHETER;  Surgeon: [name], MD;  Location: [place];  Service: General;  Laterality: N/A;  . Laparoscopic abdominal exploration N/A 08/01/2013    Procedure: LAPAROSCOPIC ABDOMINAL EXPLORATION;  Surgeon: [name], MD;  Location: [place];  Service: General;  Laterality: N/A;  . Ulnar nerve transposition Left 02/22/2014    Procedure: LEFT ULNAR NERVE RELEASE AND OR TRANSPOSITION;  Surgeon: [name], MD;  Location: [place];  Service: Orthopedics;  Laterality: Left;  . Left heart catheterization with coronary angiogram N/A 08/14/2013    Procedure: LEFT HEART CATHETERIZATION WITH CORONARY ANGIOGRAM;  Surgeon: [name], MD;  [facility];  Service: Cardiovascular;  Laterality: N/A;  . Coronary angioplasty  08/03/2014  . Cardiac catheterization  07/2013  . Tracheostomy  01/2013  . Tracheostomy closure  02/2013  . Left and right heart catheterization with coronary angiogram N/A 08/03/2014    Procedure: LEFT AND RIGHT HEART CATHETERIZATION WITH CORONARY ANGIOGRAM;  Surgeon: [name], MD;  Location: [facility] CATH LAB;  Service: Cardiovascular;  Laterality: N/A;     Current Outpatient Prescriptions  Medication Sig Dispense Refill  . acetaminophen (TYLENOL) 500 MG tablet Take 500 mg by mouth every 6 (six) hours as needed for mild pain or headache.     Marland Kitchen amLODipine (NORVASC) 10 MG tablet Take 10 mg by mouth daily.    Marland Kitchen aspirin 81 MG tablet Take 81 mg by mouth daily.     . calcium acetate (PHOSLO) 667 MG capsule Take 1,334 mg by mouth 3 (three) times daily with meals.    . carvedilol (COREG) 12.5 MG tablet Take 1 tablet (12.5 mg total) by mouth 2 (two) times daily with a meal. 60 tablet 5  . esomeprazole  (NEXIUM) 20 MG capsule Take 20 mg by mouth daily at 12 noon.    Marland Kitchen gentamicin cream (GARAMYCIN) 0.1 % Apply 1 application topically 2 (two) times daily.  99  . glipiZIDE (GLUCOTROL XL) 2.5 MG 24 hr tablet Take 2.5 mg by mouth daily as needed (diabetes).    Marland Kitchen ipratropium (ATROVENT) 0.06 % nasal spray Place 2 sprays into both nostrils as needed for rhinitis.    Marland Kitchen losartan (COZAAR) 25 MG tablet Take 1 tablet (25 mg total) by mouth daily. 90 tablet 3  . multivitamin (RENA-VIT) TABS tablet Take 1 tablet by mouth daily.    . mupirocin cream (BACTROBAN) 2 % Apply 1  application topically as needed (for skin sores in the knee area).     . nitroGLYCERIN (NITROSTAT) 0.4 MG SL tablet Place 1 tablet (0.4 mg total) under the tongue every 5 (five) minutes as needed for chest pain. 25 tablet 2  . ticagrelor (BRILINTA) 90 MG TABS tablet Take 1 tablet (90 mg total) by mouth 2 (two) times daily. 60 tablet 10  . triamcinolone cream (KENALOG) 0.1 % Apply 1 application topically 2 (two) times daily. Apply to knee and lower leg.     No current facility-administered medications for this visit.    Allergies:   Amoxicillin; Fentanyl; Moxifloxacin; and Nsaids    Social History:  The patient  reports that he quit smoking about 19 years ago. His smoking use included Cigarettes. He has a 62 pack-year smoking history. He has never used smokeless tobacco. He reports that he does not drink alcohol or use illicit drugs.   Family History:  The patient's He was adopted. Family history is unknown by patient.    ROS:  Please see the history of present illness.   Otherwise, review of systems are positive for none.   All other systems are reviewed and negative.    PHYSICAL EXAM: VS:  BP 108/52 mmHg  Pulse 97  Ht 5\' 10"  (1.778 m)  Wt 172 lb (78.019 kg)  BMI 24.68 kg/m2 , BMI Body mass index is 24.68 kg/(m^2). GEN: Well nourished, well developed, in no acute distress HEENT: normal Neck: no JVD,  or masses left carotid artery  bruit Cardiac: RRR; no murmurs, rubs, or gallops,no edema  Respiratory:  clear to auscultation bilaterally, normal work of breathing GI: soft, nontender, nondistended, + BS MS: no deformity or atrophy Skin: warm and dry, no rash Neuro:  Strength and sensation are intact Psych: euthymic mood, full affect   EKG:  EKG is not ordered today.    Recent Labs: 08/29/2013: ALT 15 07/30/2014: Pro B Natriuretic peptide (BNP) 1252.0*; TSH 2.29 08/04/2014: BUN 42*; Creatinine 9.32*; Hemoglobin 10.0*; Platelets 188; Potassium 3.7; Sodium 138    Lipid Panel    Component Value Date/Time   TRIG 434* 02/15/2013 0500      Wt Readings from Last 3 Encounters:  08/07/14 172 lb (78.019 kg)  08/04/14 165 lb 5.5 oz (75 kg)  07/30/14 177 lb (80.287 kg)      ASSESSMENT AND PLAN:  1. Chronic combined systolic/diastolic CHF NYHA class II -  Continue Coreg and ARB.   His volume is managed by peritoneal dialysis. His BP is a little soft to increase Coreg so I have asked him to decrease amlodipine to 5mg  daily and increase Coreg 25mg  BID given elevated HR in high 90's.  Hopefully with increased BP we will be able to titrate his ARB further.  2. Severe LV dysfunction EF 20-25% - this is new and most likely secondary to worsening CAD with progression of LAD disease which also give off collaterals to the distal RCA territory. Will recheck 2D echo to reassess LVF in 2 months post LAD PCI.  If EF not improved then will need referral for prophylactic AICD.   - Continue ARB/BB 3. Paroxysmal atrial flutter - one episode in the setting of acute influenza. His CHADS VASC score is 4. He was not sent home on anticoagulation due to increased weakness and falls at home. Event monitor did not show any further PAF. Continue BB 4. Mild AI - by echo 07/2014 5. 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 HTN well  controlled. Continue ARB/BB.  BP is a little soft today but he brought in his BP readings  from home and they range from 111-160/60-70's.  I will decrease amlodipine to 5mg  daily so that we can titrate BB 7. Carotid artery stenosis with bilateral bruits - recent dopplers showed the right carotid artery is >80% stenosed and left is 40-59% in prior endarterectomy - he has been referred back to vascular surgery with Dr. Kellie Simmering.  8. Dyslipidemia - he refused statin therapy and was referred to lipid clinic but did not come in January. Will reschedule to see if he is a candidate for PCSK 9 drug. I stressed the importance that he get on medical therapy for his hyperlipidemia.  9. Severe CAD with mid RCA occlusion, distal LAD stenosis, moderate disease of the left circ s/p PCI of the mid LAD with DES.  Now on DAPT with Brilinta and ASA.  Current medicines are reviewed at length with the patient today.  The patient does not have concerns regarding medicines.  The following changes have been made:  no change  Labs/ tests ordered today include: None     Disposition:   FU with me in 3 months and PA in 1 week to make sure he is tolerating increased dose of Coreg   Signed, Sueanne Margarita, MD  08/07/2014 9:13 AM    [facility] [address], [place], [place]  [address] Phone: [phone]; Fax: [phone]

## 2014-08-07 ENCOUNTER — Ambulatory Visit (INDEPENDENT_AMBULATORY_CARE_PROVIDER_SITE_OTHER): Payer: Medicare Other | Admitting: Cardiology

## 2014-08-07 ENCOUNTER — Encounter: Payer: Self-pay | Admitting: Vascular Surgery

## 2014-08-07 ENCOUNTER — Encounter: Payer: Self-pay | Admitting: Cardiology

## 2014-08-07 ENCOUNTER — Other Ambulatory Visit: Payer: Self-pay | Admitting: *Deleted

## 2014-08-07 VITALS — BP 108/52 | HR 97 | Ht 70.0 in | Wt 172.0 lb

## 2014-08-07 DIAGNOSIS — I2510 Atherosclerotic heart disease of native coronary artery without angina pectoris: Secondary | ICD-10-CM

## 2014-08-07 DIAGNOSIS — E785 Hyperlipidemia, unspecified: Secondary | ICD-10-CM

## 2014-08-07 DIAGNOSIS — I77810 Thoracic aortic ectasia: Secondary | ICD-10-CM

## 2014-08-07 DIAGNOSIS — I10 Essential (primary) hypertension: Secondary | ICD-10-CM

## 2014-08-07 DIAGNOSIS — I6523 Occlusion and stenosis of bilateral carotid arteries: Secondary | ICD-10-CM

## 2014-08-07 DIAGNOSIS — I5022 Chronic systolic (congestive) heart failure: Secondary | ICD-10-CM

## 2014-08-07 DIAGNOSIS — I6521 Occlusion and stenosis of right carotid artery: Secondary | ICD-10-CM

## 2014-08-07 DIAGNOSIS — I4892 Unspecified atrial flutter: Secondary | ICD-10-CM

## 2014-08-07 DIAGNOSIS — I255 Ischemic cardiomyopathy: Secondary | ICD-10-CM

## 2014-08-07 LAB — PATHOLOGIST SMEAR REVIEW: Path Review: REACTIVE

## 2014-08-07 MED ORDER — CARVEDILOL 25 MG PO TABS: 25.0000 mg | ORAL_TABLET | Freq: Two times a day (BID) | ORAL | Status: DC

## 2014-08-07 MED ORDER — AMLODIPINE BESYLATE 5 MG PO TABS: 5.0000 mg | ORAL_TABLET | Freq: Every day | ORAL | Status: DC

## 2014-08-07 NOTE — Patient Instructions (Addendum)
Your physician has recommended you make the following change in your medication:  1) DECREASE AMLODIPINE to 5 mg daily 2) INCREASE COREG to 25 mg TWICE A DAY  Your physician has requested that you have an echocardiogram IN TWO MONTHS. Echocardiography is a painless test that uses sound waves to create images of your heart. It provides your doctor with information about the size and shape of your heart and how well your heart's chambers and valves are working. This procedure takes approximately one hour. There are no restrictions for this procedure.  You have been referred to Lipid Clinic.  You have been referred to Dr. Radene Gunning.  Your physician recommends that you schedule a follow-up appointment in 1 week with an NP or PA.  Your physician recommends that you schedule a follow-up appointment in: 3 months with Dr. Radford Pax.

## 2014-08-08 ENCOUNTER — Encounter: Payer: Self-pay | Admitting: Vascular Surgery

## 2014-08-08 ENCOUNTER — Ambulatory Visit (INDEPENDENT_AMBULATORY_CARE_PROVIDER_SITE_OTHER): Payer: Medicare Other | Admitting: Vascular Surgery

## 2014-08-08 ENCOUNTER — Ambulatory Visit (HOSPITAL_COMMUNITY)
Admission: RE | Admit: 2014-08-08 | Discharge: 2014-08-08 | Disposition: A | Discharge status: Home / Self Care | Payer: Medicare Other | Source: Ambulatory Visit | Attending: Vascular Surgery | Admitting: Vascular Surgery

## 2014-08-08 VITALS — BP 120/67 | HR 79 | Resp 16 | Ht 70.0 in | Wt 172.0 lb

## 2014-08-08 DIAGNOSIS — I6521 Occlusion and stenosis of right carotid artery: Secondary | ICD-10-CM | POA: Diagnosis not present

## 2014-08-08 DIAGNOSIS — I255 Ischemic cardiomyopathy: Secondary | ICD-10-CM

## 2014-08-08 NOTE — Progress Notes (Signed)
[facility] [place] HISTORY AND PHYSICAL   History of Present Illness:  Patient is a 69 y.o. year old male who presents for evaluation of asymptomatic right internal carotid artery stenosis. The stenosis was noted on surveillance ultrasound. The patient denies prior history of stroke or TIA. He has no history of amaurosis. He did undergo a left carotid endarterectomy by Dr. Kellie Simmering in 2012. He was last seen here in April 2013. The patient is on aspirin and Brilinta. He had a cardiac catheterization last week and a mid left anterior descending stent placed. This was done for a decreased ejection fraction. The patient also has a history of atrial flutter. He was thought not to be a candidate for anticoagulation due to fall risk. He states that overall his balance has improved somewhat. His most recent ejection fraction was 25%. If this has not improved over the next few months and AICD may be considered. 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 This was in 2014. Other medical problems include end-stage renal disease and he is currently on peritoneal dialysis in the evenings. He also has a history of hypertension peripheral neuropathy diabetes all of which are currently controlled. He also states that general anesthesia has been difficult for him in the past as he has difficulty waking up. He had problems with this recently with placement of his peritoneal dialysis catheter. He currently places some heparin in his peritoneal dialysis solution. He does not take systemic heparin.  Past Medical History  Diagnosis Date  . Neuropathy   . H/O vitamin D deficiency   . Hearing loss   . Hypercholesteremia   . 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 S/P radiation therapy 01/24/2013-03/14/2013    Larynx/glottis / 63 Gy in 28 fractions  . Hypertension   . GERD (gastroesophageal reflux disease)   . Sleep apnea     to  request from Dr.Fried was done about 9yrs ago but no cpap d/t weight loss  . Fibromyalgia     takes tramadol daily as needed  . Myalgia and myositis   . Peripheral neuropathy   . History of colon polyps   . Cataracts, bilateral   . Dilated aortic root   . Aortic valve sclerosis   . Diastolic dysfunction   . Aortic regurgitation   . Complication of anesthesia     hard to wake up and then panics waking up  . Type II diabetes mellitus     "diet controlled now" (08/03/2014)  . Arthritis     "just age related" (08/03/2014)  . Coronary artery disease     severe CAD with mid RCA occlusion, distal LAD stenosis, moderate disease of the left circ. Repeat cath 07/2014 for worsening LVF showed severe disease of the mid LAD, mild to mod LCX disease, occluded PL of the RCA with left to right collaterals to distal RCA territory s/p PCI of the LAD with DES.  It was noted that if patient develops CP resistant to medical therapy could consider PCI of RCA  . ESRD on peritoneal dialysis 09/2013    "followed by [name]; [facility]" (08/03/2014)  . Carotid artery occlusion     s/p left CEA with 40-59% restenosis and >80% stenosis of the Right ICA - followed by Dr. Kellie Simmering  . Chronic combined systolic and diastolic CHF (congestive heart failure)     Past Surgical History  Procedure Laterality Date  . Cholecystectomy  1993  . Shoulder open rotator cuff repair Right 07/06/2011  . Carotid endarterectomy Left   . Colonoscopy w/ biopsies and polypectomy      Hx: of  . Microlaryngoscopy with laser N/A 01/04/2013    Procedure: MICROLARYNGOSCOPY WITH BIOPSY/LASER;  Surgeon: [name], MD;  Location: [facility];  Service: ENT;  Laterality: N/A;  . Av fistula placement Left 02/23/2013    Procedure: ARTERIOVENOUS (AV) FISTULA CREATION;  Surgeon: Conrad [place], MD;  Location: [place];  Service: Vascular;  Laterality: Left;  . Capd insertion N/A 08/01/2013    Procedure: LAPAROSCOPIC INSERTION CONTINUOUS AMBULATORY  PERITONEAL DIALYSIS CATHETER;  Surgeon: [name], MD;  Location: [place];  Service: General;  Laterality: N/A;  . Laparoscopic abdominal exploration N/A 08/01/2013    Procedure: LAPAROSCOPIC ABDOMINAL EXPLORATION;  Surgeon: [name], MD;  Location: [place];  Service: General;  Laterality: N/A;  . Ulnar nerve transposition Left 02/22/2014    Procedure: LEFT ULNAR NERVE RELEASE AND OR TRANSPOSITION;  Surgeon: [name], MD;  Location: [place];  Service: Orthopedics;  Laterality: Left;  . Left heart catheterization with coronary angiogram N/A 08/14/2013    Procedure: LEFT HEART CATHETERIZATION WITH CORONARY ANGIOGRAM;  Surgeon: [name], MD;  Location[facility] LAB;  Service: Cardiovascular;  Laterality: N/A;  . Coronary angioplasty  08/03/2014  . Cardiac catheterization  07/2013  . Tracheostomy  01/2013  . Tracheostomy closure  02/2013  . Left and right heart catheterization with coronary angiogram N/A 08/03/2014    Procedure: LEFT AND RIGHT HEART CATHETERIZATION WITH CORONARY ANGIOGRAM;  Surgeon: [name], MD;  Location: [facility] C[facility] LAB;  Service: Cardiovascular;  Laterality: N/A;    Social History History  Substance Use Topics  . Smoking status: Former Smoker -- 2.00 packs/day for 31 years    Types: Cigarettes    Quit date: 08/07/1995  . Smokeless tobacco: Never Used     Comment: quit smoking in 1997  . Alcohol Use: No    Family History Family History  Problem Relation Age of Onset  . Adopted: Yes  . Family history unknown: Yes    Allergies  Allergies  Allergen Reactions  . Amoxicillin     Causes upset stomach  . Fentanyl     Confusion when patch applied  . Moxifloxacin Other (See Comments)    unknown  . Nsaids Other (See Comments)    Severe kidney disease/ESRD     Current Outpatient Prescriptions  Medication Sig Dispense Refill  . acetaminophen (TYLENOL) 500 MG tablet Take 500 mg by mouth every 6 (six) hours as needed for mild pain or headache.      Marland Kitchen amLODipine (NORVASC) 5 MG tablet Take 1 tablet (5 mg total) by mouth daily. 30 tablet 6  . aspirin 81 MG tablet Take 81 mg by mouth daily.     . calcium acetate (PHOSLO) 667 MG capsule Take 1,334 mg by mouth 3 (three) times daily with meals.    . carvedilol (COREG) 25 MG tablet Take 1 tablet (25 mg total) by mouth 2 (two) times daily with a meal. 60 tablet 6  . esomeprazole (NEXIUM) 20 MG capsule Take 20 mg by mouth daily at 12 noon.    Marland Kitchen gentamicin cream (GARAMYCIN) 0.1 % Apply 1 application topically 2 (two) times daily.  99  . glipiZIDE (GLUCOTROL XL) 2.5 MG 24 hr tablet Take 2.5 mg by mouth daily as needed (diabetes).    Marland Kitchen  Heparin Sod, Porcine, in D5W (HEPARIN, PORCINE, IN D5W IV) Inject 5 mLs into the vein.    Marland Kitchen ipratropium (ATROVENT) 0.06 % nasal spray Place 2 sprays into both nostrils as needed for rhinitis.    Marland Kitchen losartan (COZAAR) 25 MG tablet Take 1 tablet (25 mg total) by mouth daily. 90 tablet 3  . multivitamin (RENA-VIT) TABS tablet Take 1 tablet by mouth daily.    . mupirocin cream (BACTROBAN) 2 % Apply 1 application topically as needed (for skin sores in the knee area).     . nitroGLYCERIN (NITROSTAT) 0.4 MG SL tablet Place 1 tablet (0.4 mg total) under the tongue every 5 (five) minutes as needed for chest pain. 25 tablet 2  . ticagrelor (BRILINTA) 90 MG TABS tablet Take 1 tablet (90 mg total) by mouth 2 (two) times daily. 60 tablet 10  . triamcinolone cream (KENALOG) 0.1 % Apply 1 application topically 2 (two) times daily. Apply to knee and lower leg.     No current facility-administered medications for this visit.    ROS:   General:  No weight loss, Fever, chills  HEENT: No recent headaches, no nasal bleeding, no visual changes, no sore throat  Neurologic: No dizziness, blackouts, seizures. No recent symptoms of stroke or mini- stroke. No recent episodes of slurred speech, or temporary blindness.  Cardiac: No recent episodes of chest pain/pressure, no shortness of  breath at rest.  No shortness of breath with exertion.  + history of atrial fibrillation or irregular heartbeat  Vascular: No history of rest pain in feet.  No history of claudication.  No history of non-healing ulcer, No history of DVT   Pulmonary: No home oxygen, no productive cough, no hemoptysis,  No asthma or wheezing  Musculoskeletal:  [ ]  Arthritis, [ ]  Low back pain,  [ x] Joint pain  Hematologic:No history of hypercoagulable state.  No history of easy bleeding.  No history of anemia  Gastrointestinal: No hematochezia or melena,  + gastroesophageal reflux, no trouble swallowing  Urinary: [x ] chronic Kidney disease, [ ]  on HD - [ ]  MWF or [ ]  TTHS, [ ]  Burning with urination, [ ]  Frequent urination, [ ]  Difficulty urinating;   Skin: No rashes  Psychological: No history of anxiety,  No history of depression   Physical Examination  Filed Vitals:   08/08/14 1014  BP: 120/67  Pulse: 79  Resp: 16  Height: 5\' 10"  (1.778 m)  Weight: 172 lb (78.019 kg)    Body mass index is 24.68 kg/(m^2).  General:  Alert and oriented, no acute distress HEENT: Normal Neck: Bilateral soft carotid bruits Pulmonary: Clear to auscultation bilaterally Cardiac: Regular Rate and Rhythm without murmur Abdomen: Soft, non-tender, slightly distended, no mass, left side peritoneal dialysis catheter  Skin: No rash Extremity Pulses:  2+ radial, brachial, femoral pulses bilaterally Musculoskeletal: No deformity or edema  Neurologic: Upper and lower extremity motor 5/5 and symmetric  DATA:  Carotid duplex scan was reviewed which shows greater than 80% stenosis right internal carotid artery velocities 418/132. Left carotid had minimal recurrent stenosis  ABI dated 12/18/2013 was reviewed. This was from our vascular lab which showed an ABI on the right of 0.9 to left of 0.72  I reviewed images of a CT scan of his neck from his prior laryngeal cancer treatment which shows he has essentially a type I  aortic arch from this study.   ASSESSMENT:  Asymptomatic high-grade right internal carotid artery stenosis. The patient has previously had  radiation to the neck. He also has concerns about general anesthesia from previous problems with this. He has also recently had coronary stenting. His ejection fraction is also depressed.   PLAN:  I believe the best option for this patient would be carotid stenting. Risks benefits possible complications and procedure details were explained to the patient and his wife today. These include but are not limited to bleeding infection stroke risk of 3-5%. He will continue Brilinta and aspirin. His carotid stent procedure is tentatively scheduled for 08/24/2014.  Ruta Hinds, MD [facility] [place] Office: [phone] Pager: [phone]

## 2014-08-09 ENCOUNTER — Other Ambulatory Visit: Payer: Self-pay

## 2014-08-10 ENCOUNTER — Ambulatory Visit (INDEPENDENT_AMBULATORY_CARE_PROVIDER_SITE_OTHER): Payer: Medicare Other | Admitting: Pharmacist

## 2014-08-10 DIAGNOSIS — E785 Hyperlipidemia, unspecified: Secondary | ICD-10-CM | POA: Diagnosis not present

## 2014-08-10 DIAGNOSIS — I255 Ischemic cardiomyopathy: Secondary | ICD-10-CM | POA: Diagnosis not present

## 2014-08-10 MED ORDER — ROSUVASTATIN CALCIUM 5 MG PO TABS: ORAL_TABLET | ORAL | Status: AC

## 2014-08-10 NOTE — Patient Instructions (Signed)
It was a pleasure to meet you today!  Start taking Crestor 5 mg tablets on Monday, Wednesday and Friday.   Please get a fasting cholesterol panel and liver function testing the week of April 11th and we will see you back in the clinic on April 15th.

## 2014-08-10 NOTE — Progress Notes (Signed)
Victor Lane is a pleasant 69 yo M referred by Dr. Radford Pax for lipid management. His PMH is significant for CAD s/p DES to LAD 07/2014, HLD, DMII, HFpEF, carotid artery stenosis (endarderectomy), ESRD on PD. He is currently not on any antilipemic medications.  He states that he has attempted use with Lipitor about 2 years ago but this caused vision changes and balance issues ("couldn't hardly walk"). He has also heard that statins can affect his liver which he is concerned about since he had an episode in 2012 in which his liver enzymes were elevated without an identifiable cause. Since that time he has refused statin therapy.   SH: no tobacco or alcohol  FH: unknown (adopted)  Diet: Eats white breads, stays away from fatty foods Limited red meats, mainly lean proteins (chicken, fish) and  vegetables   Exercise: Not much since PCI, plans to start cardiac rehab after carotid artery stenting   Labs: 05/14/14: tchol 196, TG 206, HDL 41, LDL 114 (no meds); ALT 15  Allergies  Allergen Reactions  . Amoxicillin     Causes upset stomach  . Fentanyl     Confusion when patch applied  . Moxifloxacin Other (See Comments)    unknown  . Nsaids Other (See Comments)    Severe kidney disease/ESRD   Prior to Admission medications   Medication Sig Start Date End Date Taking? Authorizing Provider  rosuvastatin (CRESTOR) 5 MG tablet Take 1 tablet by mouth as directed up to once daily 08/10/14  Yes Sueanne Margarita, MD  acetaminophen (TYLENOL) 500 MG tablet Take 500 mg by mouth every 6 (six) hours as needed for mild pain or headache.     Historical Provider, MD  amLODipine (NORVASC) 5 MG tablet Take 1 tablet (5 mg total) by mouth daily. 08/07/14   Sueanne Margarita, MD  aspirin 81 MG tablet Take 81 mg by mouth daily.     Historical Provider, MD  calcium acetate (PHOSLO) 667 MG capsule Take 1,334 mg by mouth 3 (three) times daily with meals.    Historical Provider, MD  carvedilol (COREG) 25 MG tablet Take 1 tablet  (25 mg total) by mouth 2 (two) times daily with a meal. 08/07/14   Sueanne Margarita, MD  esomeprazole (NEXIUM) 20 MG capsule Take 20 mg by mouth daily at 12 noon.    Historical Provider, MD  gentamicin cream (GARAMYCIN) 0.1 % Apply 1 application topically 2 (two) times daily. 06/05/14   Historical Provider, MD  glipiZIDE (GLUCOTROL XL) 2.5 MG 24 hr tablet Take 2.5 mg by mouth daily as needed (diabetes).    Historical Provider, MD  Heparin Sod, Porcine, in D5W (HEPARIN, PORCINE, IN D5W IV) Inject 5 mLs into the vein.    Historical Provider, MD  ipratropium (ATROVENT) 0.06 % nasal spray Place 2 sprays into both nostrils as needed for rhinitis.    Historical Provider, MD  losartan (COZAAR) 25 MG tablet Take 1 tablet (25 mg total) by mouth daily. 07/30/14   Sueanne Margarita, MD  multivitamin (RENA-VIT) TABS tablet Take 1 tablet by mouth daily.    Historical Provider, MD  mupirocin cream (BACTROBAN) 2 % Apply 1 application topically as needed (for skin sores in the knee area).     Historical Provider, MD  nitroGLYCERIN (NITROSTAT) 0.4 MG SL tablet Place 1 tablet (0.4 mg total) under the tongue every 5 (five) minutes as needed for chest pain. 08/05/14   Brittainy Erie Noe, PA-C  ticagrelor (BRILINTA) 90 MG TABS tablet  Take 1 tablet (90 mg total) by mouth 2 (two) times daily. 08/04/14   Brittainy Erie Noe, PA-C  triamcinolone cream (KENALOG) 0.1 % Apply 1 application topically 2 (two) times daily. Apply to knee and lower leg. 07/18/13   Historical Provider, MD    A/P:  Based on Mr. Messimer most recent lipid panel, he is above his goal LDL <70 and non-HDL <100. He has not tolerated Lipitor use in the past and has not been on any antilipemic agents since then. His diet is well controlled and he plans to start cardiac rehab after stenting of his carotid artery. We spoke at length about the adverse effects associated with statin therapy and he and his wife were comfortable starting with Crestor 5 mg MWF for the time  being with slow up titration as tolerated. We will reassess his lipid panel and LFTs in 2 months time.   Ruta Hinds. Velva Harman, PharmD Clinical Pharmacist - Resident Pager: [phone] Pharmacy: [phone] 08/10/2014 12:53 PM

## 2014-08-14 ENCOUNTER — Telehealth: Payer: Self-pay | Admitting: Cardiology

## 2014-08-14 NOTE — Telephone Encounter (Signed)
Per Dr. Radford Pax, instructed patient to see his PCP for evaluation before attributing to the drug. Patient agrees with treatment plan.

## 2014-08-14 NOTE — Telephone Encounter (Signed)
Two days after increasing Coreg to 25 mg BID, patient st he experienced terrible itching on his back, upper legs and arms.  The rash is described as looking like a "heat rash." It does not ooze. Patient also C/O hoarseness since increasing the drug as well. It is worse at night and in the morning.  Patient st nothing else has changed (no new body wash, detergent, lotion, etc.). He did have a heparin dwell after dialysis but has never reacted to that before. The last time he did this though was about 9 months ago.   To Dr. Radford Pax for review and recommendations.

## 2014-08-14 NOTE — Telephone Encounter (Signed)
New message      Pt c/o medication issue:  1. Name of Medication: coreg 2. How are you currently taking this medication (dosage and times per day)? 25mg  bid 3. Are you having a reaction (difficulty breathing--STAT)? no  4. What is your medication issue? Since increase in dosage itching on chest, back, upper thighs, and back part of upper arms.  Pt was taking 12-5mg . Bp 123/65 pulse 70ish.  Pt is also feeling a little more tired than normal.  Pt is having a little sob every now and then.

## 2014-08-14 NOTE — Telephone Encounter (Signed)
Please have him see his PCP for evaluation before attributing it to the drug

## 2014-08-17 IMAGING — CR DG CHEST 2V
2 series · 2 of 2 positions shown · non-contrast
Comparison: 08/12/2013.

CLINICAL DATA: End-stage renal disease.  Post dialysis.  Edema.

EXAM:
CHEST  2 VIEW

[w chest lat]
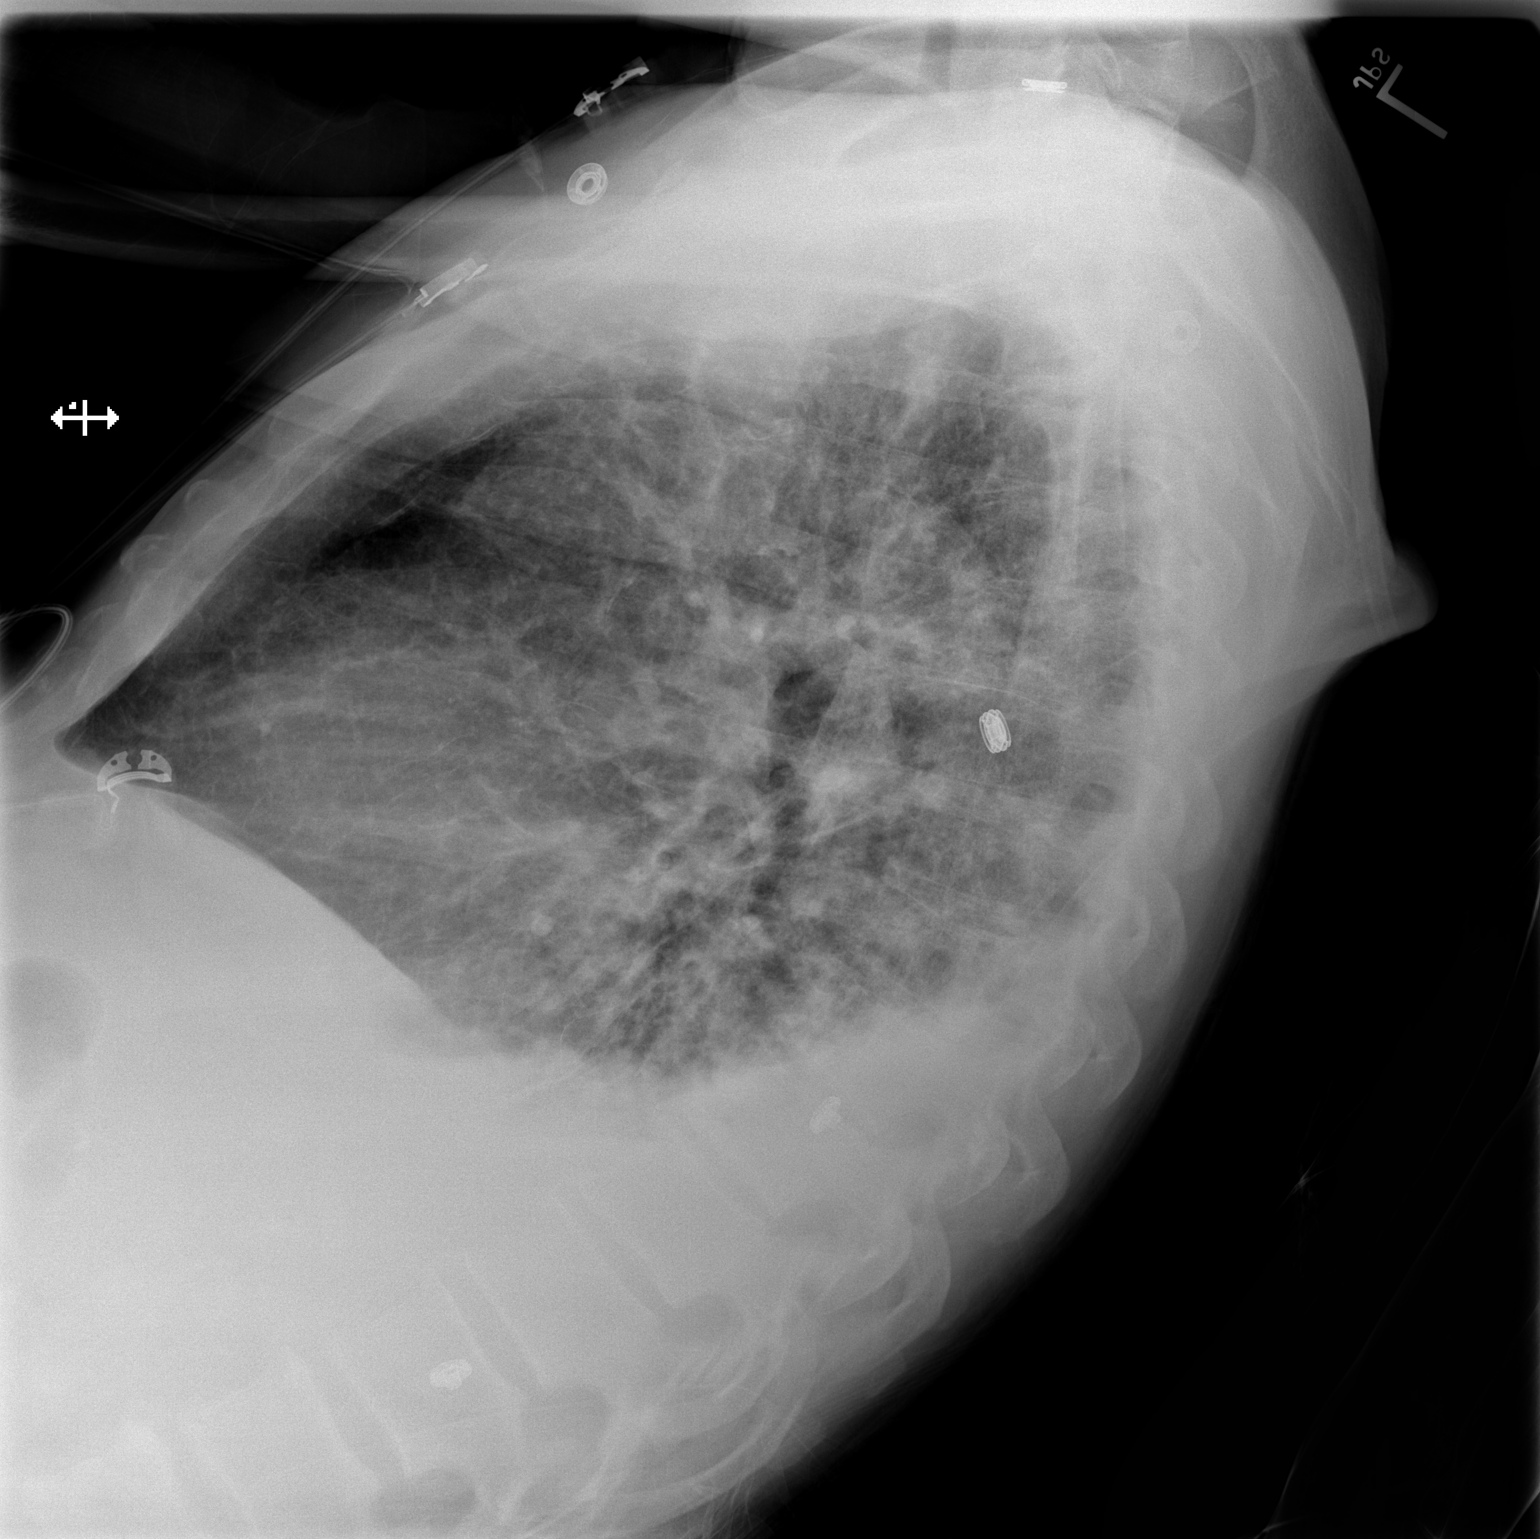

[x chest ap]
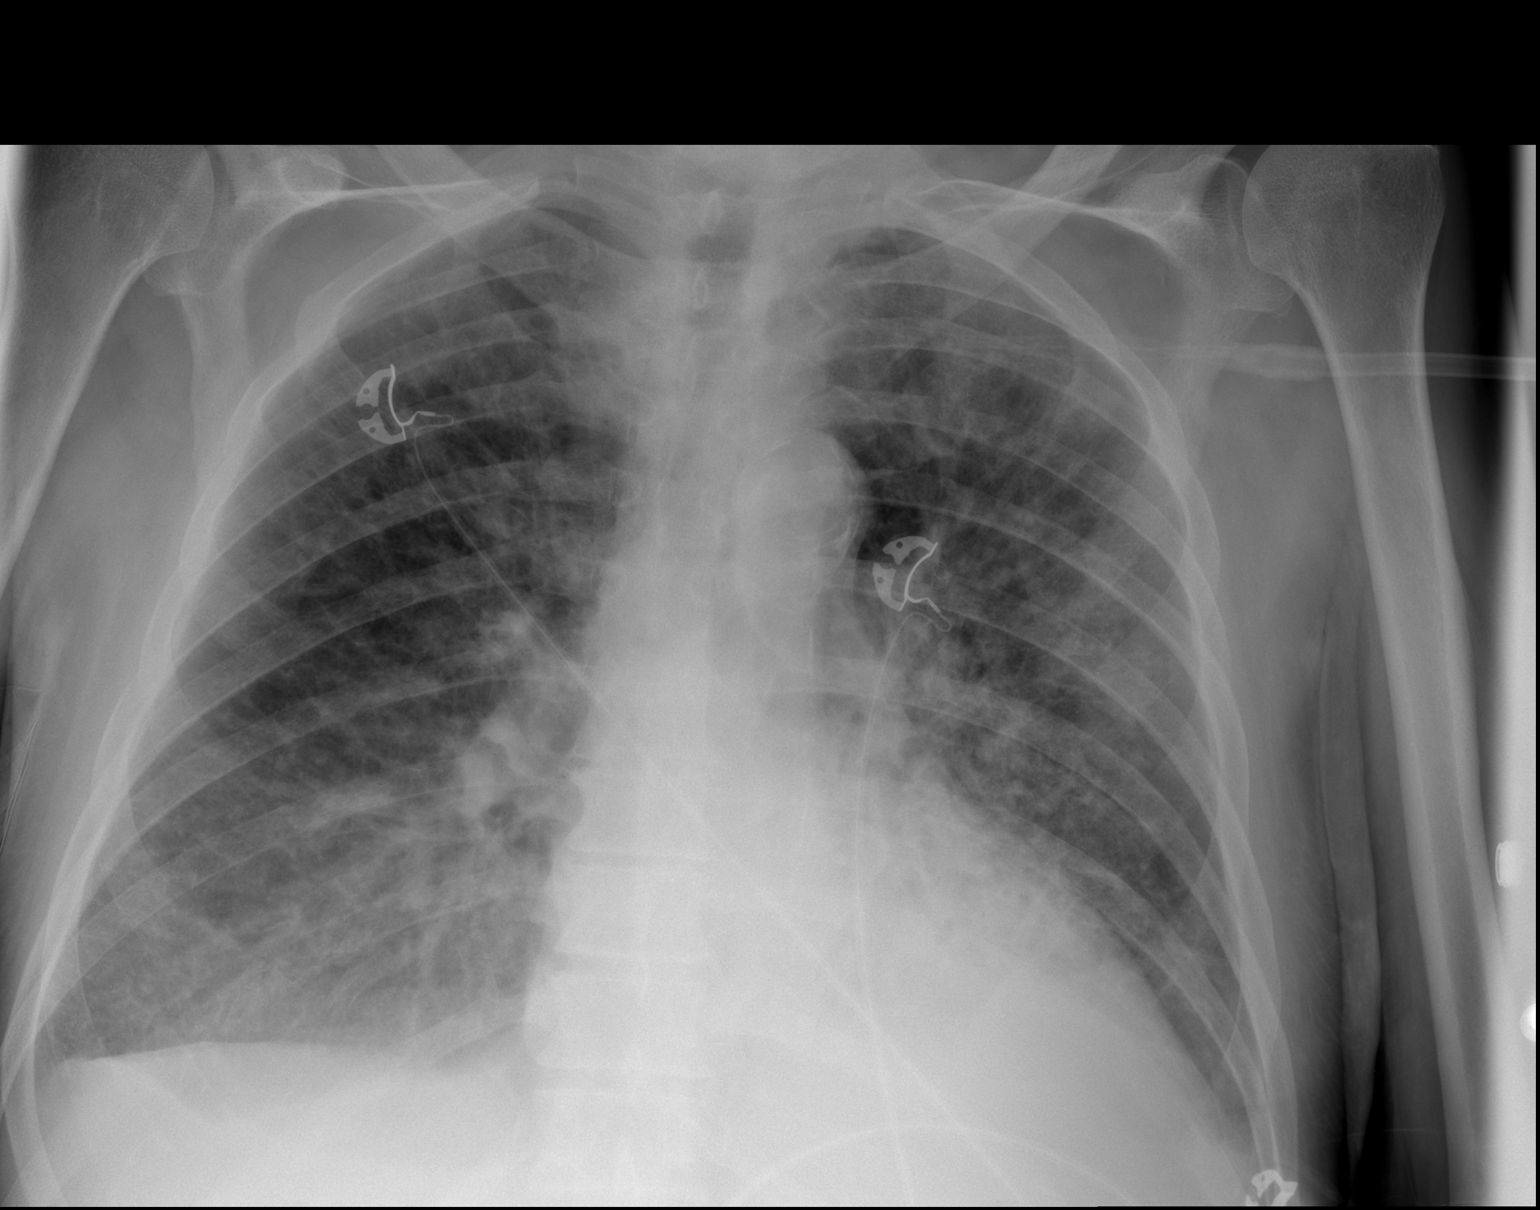

[2 of 2 positions shown; findings below may reference images not displayed]

FINDINGS: Lungs are hyperexpanded. There is bilateral irregular interstitial
thickening similar to the prior exam consistent with interstitial
edema. Small bilateral effusions are suggested. No pneumothorax.

Cardiac silhouette is normal in size and configuration. Normal
mediastinal and hilar contours.
IMPRESSION: Persistent interstitial pulmonary edema. No change from the recent
prior exam. Underlying COPD is suggested by lung hyperexpansion.

## 2014-08-20 ENCOUNTER — Ambulatory Visit: Payer: Medicare Other | Admitting: Physician Assistant

## 2014-08-20 MED ORDER — SODIUM CHLORIDE 0.9 % IJ SOLN: 3.0000 mL | INTRAMUSCULAR | Status: DC | PRN

## 2014-08-21 ENCOUNTER — Encounter (HOSPITAL_COMMUNITY)
Admission: RE | Disposition: A | Discharge status: Home / Self Care | Payer: Medicare Other | Source: Ambulatory Visit | Attending: Vascular Surgery

## 2014-08-21 ENCOUNTER — Inpatient Hospital Stay (HOSPITAL_COMMUNITY)
Admission: RE | Admit: 2014-08-21 | Discharge: 2014-08-22 | DRG: 034 | Disposition: A | Discharge status: Home / Self Care | Payer: Medicare Other | Source: Ambulatory Visit | Attending: Vascular Surgery | Admitting: Vascular Surgery

## 2014-08-21 ENCOUNTER — Encounter (HOSPITAL_COMMUNITY): Payer: Self-pay | Admitting: *Deleted

## 2014-08-21 DIAGNOSIS — E780 Pure hypercholesterolemia: Secondary | ICD-10-CM | POA: Diagnosis present

## 2014-08-21 DIAGNOSIS — I120 Hypertensive chronic kidney disease with stage 5 chronic kidney disease or end stage renal disease: Secondary | ICD-10-CM | POA: Diagnosis present

## 2014-08-21 DIAGNOSIS — I2510 Atherosclerotic heart disease of native coronary artery without angina pectoris: Secondary | ICD-10-CM | POA: Diagnosis present

## 2014-08-21 DIAGNOSIS — N186 End stage renal disease: Secondary | ICD-10-CM | POA: Diagnosis present

## 2014-08-21 DIAGNOSIS — M797 Fibromyalgia: Secondary | ICD-10-CM | POA: Diagnosis present

## 2014-08-21 DIAGNOSIS — C329 Malignant neoplasm of larynx, unspecified: Secondary | ICD-10-CM | POA: Diagnosis present

## 2014-08-21 DIAGNOSIS — I6521 Occlusion and stenosis of right carotid artery: Secondary | ICD-10-CM | POA: Diagnosis present

## 2014-08-21 DIAGNOSIS — Z7902 Long term (current) use of antithrombotics/antiplatelets: Secondary | ICD-10-CM | POA: Diagnosis not present

## 2014-08-21 DIAGNOSIS — Z7982 Long term (current) use of aspirin: Secondary | ICD-10-CM | POA: Diagnosis not present

## 2014-08-21 DIAGNOSIS — Z955 Presence of coronary angioplasty implant and graft: Secondary | ICD-10-CM | POA: Diagnosis not present

## 2014-08-21 DIAGNOSIS — E119 Type 2 diabetes mellitus without complications: Secondary | ICD-10-CM | POA: Diagnosis present

## 2014-08-21 DIAGNOSIS — Z87891 Personal history of nicotine dependence: Secondary | ICD-10-CM

## 2014-08-21 DIAGNOSIS — I959 Hypotension, unspecified: Secondary | ICD-10-CM | POA: Diagnosis not present

## 2014-08-21 DIAGNOSIS — J449 Chronic obstructive pulmonary disease, unspecified: Secondary | ICD-10-CM | POA: Diagnosis present

## 2014-08-21 DIAGNOSIS — K219 Gastro-esophageal reflux disease without esophagitis: Secondary | ICD-10-CM | POA: Diagnosis present

## 2014-08-21 DIAGNOSIS — Z923 Personal history of irradiation: Secondary | ICD-10-CM | POA: Diagnosis not present

## 2014-08-21 DIAGNOSIS — N2581 Secondary hyperparathyroidism of renal origin: Secondary | ICD-10-CM | POA: Diagnosis present

## 2014-08-21 DIAGNOSIS — Z992 Dependence on renal dialysis: Secondary | ICD-10-CM | POA: Diagnosis not present

## 2014-08-21 DIAGNOSIS — G629 Polyneuropathy, unspecified: Secondary | ICD-10-CM | POA: Diagnosis present

## 2014-08-21 DIAGNOSIS — I4892 Unspecified atrial flutter: Secondary | ICD-10-CM | POA: Diagnosis present

## 2014-08-21 DIAGNOSIS — I351 Nonrheumatic aortic (valve) insufficiency: Secondary | ICD-10-CM | POA: Diagnosis present

## 2014-08-21 DIAGNOSIS — I77810 Thoracic aortic ectasia: Secondary | ICD-10-CM | POA: Diagnosis present

## 2014-08-21 DIAGNOSIS — I6529 Occlusion and stenosis of unspecified carotid artery: Secondary | ICD-10-CM | POA: Diagnosis present

## 2014-08-21 HISTORY — PX: CAROTID STENT INSERTION: SHX5505

## 2014-08-21 LAB — POCT I-STAT, CHEM 8
BUN: 45 mg/dL — AB (ref 6–23)
CREATININE: 8.6 mg/dL — AB (ref 0.50–1.35)
Calcium, Ion: 1.11 mmol/L — ABNORMAL LOW (ref 1.13–1.30)
Chloride: 99 mmol/L (ref 96–112)
Glucose, Bld: 98 mg/dL (ref 70–99)
HEMATOCRIT: 33 % — AB (ref 39.0–52.0)
HEMOGLOBIN: 11.2 g/dL — AB (ref 13.0–17.0)
POTASSIUM: 4.2 mmol/L (ref 3.5–5.1)
SODIUM: 138 mmol/L (ref 135–145)
TCO2: 21 mmol/L (ref 0–100)

## 2014-08-21 LAB — PROTIME-INR
INR: 1.01 (ref 0.00–1.49)
PROTHROMBIN TIME: 13.4 s (ref 11.6–15.2)

## 2014-08-21 LAB — GLUCOSE, CAPILLARY
Glucose-Capillary: 96 mg/dL (ref 70–99)
Glucose-Capillary: 88 mg/dL (ref 70–99)
Glucose-Capillary: 94 mg/dL (ref 70–99)
Glucose-Capillary: 106 mg/dL — ABNORMAL HIGH (ref 70–99)
Glucose-Capillary: 91 mg/dL (ref 70–99)

## 2014-08-21 LAB — POCT ACTIVATED CLOTTING TIME: Activated Clotting Time: 325 seconds

## 2014-08-21 LAB — MRSA PCR SCREENING: MRSA by PCR: NEGATIVE

## 2014-08-21 SURGERY — CAROTID STENT INSERTION

## 2014-08-21 MED ORDER — LOSARTAN POTASSIUM 50 MG PO TABS: 50.0000 mg | ORAL_TABLET | Freq: Every day | ORAL | Status: DC

## 2014-08-21 MED ORDER — DELFLEX-LM/2.5% DEXTROSE 396 MOSM/L IP SOLN: INTRAPERITONEAL | Status: DC

## 2014-08-21 MED ORDER — ACETAMINOPHEN 500 MG PO TABS: 1000.0000 mg | ORAL_TABLET | Freq: Four times a day (QID) | ORAL | Status: DC | PRN

## 2014-08-21 MED ORDER — CALCIUM ACETATE 667 MG PO CAPS
1334.0000 mg | ORAL_CAPSULE | Freq: Three times a day (TID) | ORAL | Status: DC
  Administered 2014-08-21 – 2014-08-22 (×2): 1334 mg via ORAL
  Filled 2014-08-21 (×5): qty 2

## 2014-08-21 MED ORDER — RENA-VITE PO TABS
1.0000 | ORAL_TABLET | Freq: Every day | ORAL | Status: DC
  Administered 2014-08-21: 1 via ORAL
  Filled 2014-08-21 (×2): qty 1

## 2014-08-21 MED ORDER — BIVALIRUDIN 250 MG IV SOLR
INTRAVENOUS | Status: AC
  Filled 2014-08-21: qty 250

## 2014-08-21 MED ORDER — CARVEDILOL 25 MG PO TABS
25.0000 mg | ORAL_TABLET | Freq: Two times a day (BID) | ORAL | Status: DC
  Filled 2014-08-21 (×2): qty 1

## 2014-08-21 MED ORDER — RENA-VITE PO TABS
1.0000 | ORAL_TABLET | Freq: Every day | ORAL | Status: DC
Start: 2014-08-21 — End: 2014-08-21

## 2014-08-21 MED ORDER — PANTOPRAZOLE SODIUM 40 MG PO TBEC
40.0000 mg | DELAYED_RELEASE_TABLET | Freq: Every day | ORAL | Status: DC
  Administered 2014-08-22: 40 mg via ORAL
  Filled 2014-08-21: qty 1

## 2014-08-21 MED ORDER — AMLODIPINE BESYLATE 5 MG PO TABS
5.0000 mg | ORAL_TABLET | Freq: Every day | ORAL | Status: DC
  Filled 2014-08-21 (×2): qty 1

## 2014-08-21 MED ORDER — SODIUM CHLORIDE 0.9 % IV SOLN
INTRAVENOUS | Status: DC
  Administered 2014-08-21: 10:00:00 via INTRAVENOUS

## 2014-08-21 MED ORDER — TICAGRELOR 90 MG PO TABS
90.0000 mg | ORAL_TABLET | Freq: Two times a day (BID) | ORAL | Status: DC
  Administered 2014-08-21 – 2014-08-22 (×2): 90 mg via ORAL
  Filled 2014-08-21 (×3): qty 1

## 2014-08-21 MED ORDER — LIDOCAINE HCL (PF) 1 % IJ SOLN
INTRAMUSCULAR | Status: AC
  Filled 2014-08-21: qty 30

## 2014-08-21 MED ORDER — INSULIN ASPART 100 UNIT/ML ~~LOC~~ SOLN: 0.0000 [IU] | Freq: Three times a day (TID) | SUBCUTANEOUS | Status: DC

## 2014-08-21 MED ORDER — ATROPINE SULFATE 0.1 MG/ML IJ SOLN
INTRAMUSCULAR | Status: AC
  Filled 2014-08-21: qty 10

## 2014-08-21 MED ORDER — TRAMADOL HCL 50 MG PO TABS: 100.0000 mg | ORAL_TABLET | Freq: Two times a day (BID) | ORAL | Status: DC | PRN

## 2014-08-21 MED ORDER — ROSUVASTATIN CALCIUM 5 MG PO TABS
5.0000 mg | ORAL_TABLET | ORAL | Status: DC
  Administered 2014-08-22: 5 mg via ORAL
  Filled 2014-08-21: qty 1

## 2014-08-21 MED ORDER — LABETALOL HCL 5 MG/ML IV SOLN: 10.0000 mg | INTRAVENOUS | Status: DC | PRN

## 2014-08-21 MED ORDER — ALPRAZOLAM 0.5 MG PO TABS: 0.5000 mg | ORAL_TABLET | Freq: Every day | ORAL | Status: DC

## 2014-08-21 MED ORDER — ALUM & MAG HYDROXIDE-SIMETH 200-200-20 MG/5ML PO SUSP: 15.0000 mL | ORAL | Status: DC | PRN

## 2014-08-21 MED ORDER — OXYCODONE HCL 5 MG PO TABS: 5.0000 mg | ORAL_TABLET | ORAL | Status: DC | PRN

## 2014-08-21 MED ORDER — IPRATROPIUM BROMIDE 0.06 % NA SOLN
2.0000 | Freq: Two times a day (BID) | NASAL | Status: DC
  Administered 2014-08-21: 2 via NASAL
  Filled 2014-08-21: qty 15

## 2014-08-21 MED ORDER — MORPHINE SULFATE 2 MG/ML IJ SOLN
2.0000 mg | INTRAMUSCULAR | Status: DC | PRN
Start: 2014-08-21 — End: 2014-08-22

## 2014-08-21 MED ORDER — HYDRALAZINE HCL 20 MG/ML IJ SOLN: 5.0000 mg | INTRAMUSCULAR | Status: DC | PRN

## 2014-08-21 MED ORDER — METOPROLOL TARTRATE 1 MG/ML IV SOLN
2.0000 mg | INTRAVENOUS | Status: DC | PRN
Start: 2014-08-21 — End: 2014-08-22

## 2014-08-21 MED ORDER — PHENOL 1.4 % MT LIQD: 1.0000 | OROMUCOSAL | Status: DC | PRN

## 2014-08-21 MED ORDER — CALCIUM ACETATE (PHOS BINDER) 667 MG PO CAPS
667.0000 mg | ORAL_CAPSULE | Freq: Three times a day (TID) | ORAL | Status: DC | PRN
  Administered 2014-08-21: 667 mg via ORAL
  Filled 2014-08-21 (×2): qty 1

## 2014-08-21 MED ORDER — NITROGLYCERIN 0.4 MG SL SUBL: 0.4000 mg | SUBLINGUAL_TABLET | SUBLINGUAL | Status: DC | PRN

## 2014-08-21 MED ORDER — ZOLPIDEM TARTRATE 5 MG PO TABS
5.0000 mg | ORAL_TABLET | Freq: Every evening | ORAL | Status: DC | PRN
  Administered 2014-08-22: 5 mg via ORAL
  Filled 2014-08-21: qty 1

## 2014-08-21 MED ORDER — GUAIFENESIN-DM 100-10 MG/5ML PO SYRP: 15.0000 mL | ORAL_SOLUTION | ORAL | Status: DC | PRN

## 2014-08-21 MED ORDER — TRIAMCINOLONE ACETONIDE 0.1 % EX CREA
1.0000 "application " | TOPICAL_CREAM | Freq: Two times a day (BID) | CUTANEOUS | Status: DC | PRN
  Filled 2014-08-21 (×2): qty 15

## 2014-08-21 MED ORDER — AMLODIPINE BESYLATE 5 MG PO TABS: 5.0000 mg | ORAL_TABLET | Freq: Every day | ORAL | Status: DC

## 2014-08-21 MED ORDER — MUPIROCIN 2 % EX OINT
1.0000 "application " | TOPICAL_OINTMENT | Freq: Two times a day (BID) | CUTANEOUS | Status: DC | PRN
  Filled 2014-08-21: qty 22

## 2014-08-21 MED ORDER — CARVEDILOL 6.25 MG PO TABS
6.2500 mg | ORAL_TABLET | Freq: Two times a day (BID) | ORAL | Status: DC
  Filled 2014-08-21 (×4): qty 1

## 2014-08-21 MED ORDER — LOSARTAN POTASSIUM 25 MG PO TABS: 25.0000 mg | ORAL_TABLET | Freq: Every day | ORAL | Status: DC

## 2014-08-21 MED ORDER — ASPIRIN EC 81 MG PO TBEC
81.0000 mg | DELAYED_RELEASE_TABLET | Freq: Every day | ORAL | Status: DC
  Administered 2014-08-22: 81 mg via ORAL
  Filled 2014-08-21: qty 1

## 2014-08-21 MED ORDER — GLIPIZIDE ER 2.5 MG PO TB24
2.5000 mg | ORAL_TABLET | Freq: Every day | ORAL | Status: DC | PRN
  Filled 2014-08-21: qty 1

## 2014-08-21 MED ORDER — HEPARIN (PORCINE) IN NACL 2-0.9 UNIT/ML-% IJ SOLN
INTRAMUSCULAR | Status: AC
  Filled 2014-08-21: qty 1000

## 2014-08-21 MED ORDER — NOREPINEPHRINE BITARTRATE 1 MG/ML IV SOLN
INTRAVENOUS | Status: AC
  Filled 2014-08-21: qty 4

## 2014-08-21 MED ORDER — ONDANSETRON HCL 4 MG/2ML IJ SOLN
4.0000 mg | Freq: Four times a day (QID) | INTRAMUSCULAR | Status: DC | PRN
  Administered 2014-08-21: 4 mg via INTRAVENOUS
  Filled 2014-08-21: qty 2

## 2014-08-21 MED ORDER — DOPAMINE-DEXTROSE 3.2-5 MG/ML-% IV SOLN
3.0000 ug/kg/min | INTRAVENOUS | Status: DC
  Administered 2014-08-22: 3 ug/kg/min via INTRAVENOUS

## 2014-08-21 MED ORDER — ACETAMINOPHEN 325 MG PO TABS
325.0000 mg | ORAL_TABLET | ORAL | Status: DC | PRN
  Administered 2014-08-21 – 2014-08-22 (×2): 650 mg via ORAL
  Filled 2014-08-21 (×2): qty 2

## 2014-08-21 MED ORDER — GENTAMICIN SULFATE 0.1 % EX CREA
TOPICAL_CREAM | Freq: Every day | CUTANEOUS | Status: DC
  Administered 2014-08-22: 1 via TOPICAL
  Filled 2014-08-21: qty 15

## 2014-08-21 MED ORDER — SODIUM CHLORIDE 0.9 % IV BOLUS (SEPSIS)
500.0000 mL | Freq: Once | INTRAVENOUS | Status: AC
  Administered 2014-08-21: 500 mL via INTRAVENOUS

## 2014-08-21 MED ORDER — ACETAMINOPHEN 650 MG RE SUPP: 325.0000 mg | RECTAL | Status: DC | PRN

## 2014-08-21 MED ORDER — DOPAMINE-DEXTROSE 3.2-5 MG/ML-% IV SOLN
INTRAVENOUS | Status: AC
  Administered 2014-08-22: 3 ug/kg/min via INTRAVENOUS
  Filled 2014-08-21: qty 250

## 2014-08-21 MED ORDER — AMLODIPINE BESYLATE 10 MG PO TABS: 10.0000 mg | ORAL_TABLET | Freq: Every day | ORAL | Status: DC

## 2014-08-21 NOTE — Consult Note (Signed)
Indication for Consultation:  Management of ESRD/hemodialysis; anemia, hypertension/volume and secondary hyperparathyroidism  HPI: Victor Lane is a 69 y.o. male who presented for stent placement of asymptomatic right internal carotid artery with >80% stenosis which was found on surveillance ultrasound. He undergoes PD at home, history of HTN, DM, CAD, atrial flutter, and left carotid endarterectomy in 2012 . He underwent cardiac cath on 08/03/14 after recent ECHO showed decreased EF to 20-25%, he had mid left ant descending stent placed. He is not on anticoagulation due to weakness and falls at home. Will arrange for PD tonight- likely for DC tomorrow. Recent prob with DOE worse.  Is on 3 Bp meds and has not lowered wgt.  Past Medical History  Diagnosis Date  . Neuropathy   . H/O vitamin D deficiency   . Hearing loss   . Hypercholesteremia   . 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 S/P radiation therapy 01/24/2013-03/14/2013    Larynx/glottis / 63 Gy in 28 fractions  . Hypertension   . GERD (gastroesophageal reflux disease)   . Sleep apnea     to request from Dr.Fried was done about 97yrs ago but no cpap d/t weight loss  . Fibromyalgia     takes tramadol daily as needed  . Myalgia and myositis   . Peripheral neuropathy   . History of colon polyps   . Cataracts, bilateral   . Dilated aortic root   . Aortic valve sclerosis   . Diastolic dysfunction   . Aortic regurgitation   . Complication of anesthesia     hard to wake up and then panics waking up  . Type II diabetes mellitus     "diet controlled now" (08/03/2014)  . Arthritis     "just age related" (08/03/2014)  . Coronary artery disease     severe CAD with mid RCA occlusion, distal LAD stenosis, moderate disease of the left circ. Repeat cath 07/2014 for worsening LVF showed severe disease of the mid LAD, mild to mod LCX disease, occluded PL of the RCA with left to right  collaterals to distal RCA territory s/p PCI of the LAD with DES.  It was noted that if patient develops CP resistant to medical therapy could consider PCI of RCA  . ESRD on peritoneal dialysis 09/2013    "followed by [name]; [facility]" (08/03/2014)  . Carotid artery occlusion     s/p left CEA with 40-59% restenosis and >80% stenosis of the Right ICA - followed by Dr. Kellie Simmering  . Chronic combined systolic and diastolic CHF (congestive heart failure)    Past Surgical History  Procedure Laterality Date  . Cholecystectomy  1993  . Shoulder open rotator cuff repair Right 07/06/2011  . Carotid endarterectomy Left   . Colonoscopy w/ biopsies and polypectomy      Hx: of  . Microlaryngoscopy with laser N/A 01/04/2013    Procedure: MICROLARYNGOSCOPY WITH BIOPSY/LASER;  Surgeon: [name], MD;  Location: [place];  Service: ENT;  Laterality: N/A;  . Av fistula placement Left 02/23/2013    Procedure: ARTERIOVENOUS (AV) FISTULA CREATION;  Surgeon: Conrad Butler, MD;  Location: [place];  Service: Vascular;  Laterality: Left;  . Capd insertion N/A 08/01/2013    Procedure: LAPAROSCOPIC INSERTION CONTINUOUS AMBULATORY PERITONEAL DIALYSIS CATHETER;  Surgeon: [name], MD;  Location: [place];  Service: General;  Laterality: N/A;  . Laparoscopic abdominal exploration N/A 08/01/2013  Procedure: LAPAROSCOPIC ABDOMINAL EXPLORATION;  Surgeon: [name], MD;  Location: [place];  Service: General;  Laterality: N/A;  . Ulnar nerve transposition Left 02/22/2014    Procedure: LEFT ULNAR NERVE RELEASE AND OR TRANSPOSITION;  Surgeon: [name], MD;  Location: [place];  Service: Orthopedics;  Laterality: Left;  . Left heart catheterization with coronary angiogram N/A 08/14/2013    Procedure: LEFT HEART CATHETERIZATION WITH CORONARY ANGIOGRAM;  Surgeon: [name], MD;  Location: [facility] CATH LAB;  Service: Cardiovascular;  Laterality: N/A;  . Coronary angioplasty  08/03/2014  . Cardiac catheterization   07/2013  . Tracheostomy  01/2013  . Tracheostomy closure  02/2013  . Left and right heart catheterization with coronary angiogram N/A 08/03/2014    Procedure: LEFT AND RIGHT HEART CATHETERIZATION WITH CORONARY ANGIOGRAM;  Surgeon: [name], MD;  Location: [facility] CATH LAB;  Service: Cardiovascular;  Laterality: N/A;   Family History  Problem Relation Age of Onset  . Adopted: Yes  . Family history unknown: Yes   Social History:  reports that he quit smoking about 19 years ago. His smoking use included Cigarettes. He has a 62 pack-year smoking history. He has never used smokeless tobacco. He reports that he does not drink alcohol or use illicit drugs. Allergies  Allergen Reactions  . Amoxicillin Diarrhea and Nausea And Vomiting  . Fentanyl Other (See Comments)    Confusion when patch applied  . Moxifloxacin Diarrhea and Nausea And Vomiting    Avelox  . Nsaids Other (See Comments)    Severe kidney disease/ESRD   Prior to Admission medications   Medication Sig Start Date End Date Taking? Authorizing Provider  acetaminophen (TYLENOL) 500 MG tablet Take 1,000 mg by mouth every 6 (six) hours as needed for mild pain or headache.    Yes Historical Provider, MD  ALPRAZolam Duanne Moron) 0.5 MG tablet Take 0.5 mg by mouth at bedtime.  08/06/14  Yes Historical Provider, MD  amLODipine (NORVASC) 5 MG tablet Take 1 tablet (5 mg total) by mouth daily. 08/07/14  Yes Sueanne Margarita, MD  aspirin EC 81 MG tablet Take 81 mg by mouth daily.   Yes Historical Provider, MD  calcium acetate (PHOSLO) 667 MG capsule Take 1,334 mg by mouth See admin instructions. Take 2 capsules (1334 mg) with meals and 1 capsule (667 mg) with snacks   Yes Historical Provider, MD  carvedilol (COREG) 25 MG tablet Take 1 tablet (25 mg total) by mouth 2 (two) times daily with a meal. 08/07/14  Yes Sueanne Margarita, MD  esomeprazole (NEXIUM) 20 MG capsule Take 20 mg by mouth daily before breakfast.    Yes Historical Provider, MD  glipiZIDE  (GLUCOTROL XL) 2.5 MG 24 hr tablet Take 2.5 mg by mouth daily as needed (CBG over 140).    Yes Historical Provider, MD  Heparin Sod, Porcine, in D5W (HEPARIN, PORCINE, IN D5W IV) Inject 5 mLs into the vein daily as needed (with dialysis).    Yes Historical Provider, MD  ipratropium (ATROVENT) 0.06 % nasal spray Place 2 sprays into both nostrils 2 (two) times daily.    Yes Historical Provider, MD  losartan (COZAAR) 25 MG tablet Take 1 tablet (25 mg total) by mouth daily. 07/30/14  Yes Sueanne Margarita, MD  multivitamin (RENA-VIT) TABS tablet Take 1 tablet by mouth daily.   Yes Historical Provider, MD  mupirocin ointment (BACTROBAN) 2 % Apply 1 application topically 2 (two) times daily as needed (rash).    Yes  Historical Provider, MD  nitroGLYCERIN (NITROSTAT) 0.4 MG SL tablet Place 1 tablet (0.4 mg total) under the tongue every 5 (five) minutes as needed for chest pain. 08/05/14  Yes Brittainy Erie Noe, PA-C  rosuvastatin (CRESTOR) 5 MG tablet Take 1 tablet by mouth as directed up to once daily Patient taking differently: Take 5 mg by mouth 3 (three) times a week. Monday, Wednesday, Friday at noon 08/10/14  Yes Sueanne Margarita, MD  ticagrelor (BRILINTA) 90 MG TABS tablet Take 1 tablet (90 mg total) by mouth 2 (two) times daily. 08/04/14  Yes Brittainy Erie Noe, PA-C  traMADol (ULTRAM) 50 MG tablet Take 100 mg by mouth every 4 (four) hours as needed (pain).   Yes Historical Provider, MD  triamcinolone cream (KENALOG) 0.1 % Apply 1 application topically 2 (two) times daily as needed (rash).  08/07/14  Yes Historical Provider, MD  zolpidem (AMBIEN) 5 MG tablet Take 5 mg by mouth at bedtime as needed for sleep.  07/31/14  Yes Historical Provider, MD   Current Facility-Administered Medications  Medication Dose Route Frequency Provider Last Rate Last Dose  . 0.9 %  sodium chloride infusion   Intravenous Continuous Serafina Mitchell, MD      . sodium chloride 0.9 % injection 3 mL  3 mL Intravenous PRN [name], MD       Labs: Basic Metabolic Panel:  Recent Labs Lab 08/21/14 0612  NA 138  K 4.2  CL 99  GLUCOSE 98  BUN 45*  CREATININE 8.60*   Liver Function Tests: No results for input(s): AST, ALT, ALKPHOS, BILITOT, PROT, ALBUMIN in the last 168 hours. No results for input(s): LIPASE, AMYLASE in the last 168 hours. No results for input(s): AMMONIA in the last 168 hours. CBC:  Recent Labs Lab 08/21/14 0612  HGB 11.2*  HCT 33.0*   Cardiac Enzymes: No results for input(s): CKTOTAL, CKMB, CKMBINDEX, TROPONINI in the last 168 hours. CBG:  Recent Labs Lab 08/21/14 0555  GLUCAP 96   Iron Studies: No results for input(s): IRON, TIBC, TRANSFERRIN, FERRITIN in the last 72 hours. Studies/Results: No results found.   Review of Systems: Gen: Positive for weakness and fatigue/ chronic. Denies any fever, chills, sweats, anorexia, DOE most prom c/o HEENT: No visual complaints, No history of Retinopathy. Normal external appearance No Epistaxis or Sore throat. No sinusitis.   CV: Denies chest pain, angina, palpitations, syncope, orthopnea, PND, peripheral edema, and claudication.SOB <1 flight , <11ft Resp: Positive for dyspnea with exercise. Denies dyspnea at rest, cough, sputum, wheezing, coughing up blood, and pleurisy. White to Green phlegm in am GI: Denies vomiting blood, jaundice, and fecal incontinence.   Denies dysphagia or odynophagia. GU : Denies urinary burning, blood in urine, urinary frequency, urinary hesitancy, nocturnal urination, and urinary incontinence.  No renal calculi. MS: Denies joint pain, limitation of movement, and swelling, stiffness, low back pain, extremity pain. Denies muscle weakness, cramps, atrophy.  No use of non steroidal antiinflammatory drugs. Derm: Denies rash, itching, dry skin, hives, moles, warts, or unhealing ulcers. Had itching recently after cath and stent.  Psych: Denies depression, anxiety, memory loss, suicidal ideation, hallucinations,  paranoia, and confusion. Heme: Denies bruising, bleeding, and enlarged lymph nodes. Neuro: No headache.  No diplopia. No dysarthria.  No dysphasia.  No history of CVA.  No Seizures. No paresthesias.  No weakness. Endocrine DM- AM glucose around 110 No prob with I & O fluid, ES, and now fluid clear.  Recent episode of fibrin.  Physical Exam: Filed Vitals:   08/21/14 0543 08/21/14 0748 08/21/14 0925 08/21/14 0940  BP: 107/59  129/50 131/52  Pulse: 86 73 75 76  Temp: 97.1 F (36.2 C)     TempSrc: Oral     Resp: 18  14 22   Height: 5\' 10"  (1.778 m)     Weight: 76.204 kg (168 lb)     SpO2: 97%  97% 98%     General: Well developed, well nourished, in no acute distress. Head: Normocephalic, atraumatic, sclera non-icteric, mucus membranes are moist Fundi benign Neck: Supple. JVD not elevated. PCL Lungs: Clear bilaterally to auscultation without wheezes, rales, or rhonchi. Breathing is unlabored. Heart: RRR with S1 S2. No murmurs, rubs, or gallops appreciated. Gr2/6 M Abdomen: Soft, non-tender, full, with normoactive bowel sounds. No rebound/guarding. No obvious abdominal masses. Pos fluid wave. Liver down 3 cm Cath L mid abdm M-S:  Strength and tone appear normal for age. Lower extremities: No LE edema. R groin cath site. Soft nontender, no bleeding noted. Bilat fem bruits Neuro: Alert and oriented X 3. Moves all extremities spontaneously. Psych:  Responds to questions appropriately with a normal affect. Dialysis Access:  LLQ PD cath no signs of infection    CCPD 7x week  76.5kgs  Exchanges 5, fill volume 2500, dwell time 1hr 30 mins. Last fill vol 1000 No meds  Assessment/Plan: 1.  R ICA stenosis- carotid stent insertion for 80% stenosis. Dr Oneida Alar (pasat L carotid endarterectomy in 2012) on ASA and brilinta 2.  ESRD -  CCPD q night. K+ 4.2 3.  Hypertension/volume  - 124/52 amlodipine 10, coreg 6.25 BID, losartan 50 q day BP low and with sx will rec lower dry.  D/C  Losartan, lower  Amlod.   4.  Anemia  - hgb 11.2. No meds. Watch CBC 5.  Metabolic bone disease -  phoslo w meals 6.  Nutrition -  Renal diet. multivit 7. DM- per primary/mostly diet controlled. 8. Aflutter- no anticoagulation 9. CHF- recent decrease in EF- now 25%  Shelle Iron, NP [employer] [phone] 08/21/2014, 9:55 AM I have seen and examined this patient and agree with the plan of care seen, examined, eval, discussed with family. Meds eval and adjusted.  Prescription written. .  Kensleigh Gates L 08/21/2014, 1:14 PM

## 2014-08-21 NOTE — Progress Notes (Signed)
Pt arrived to unit accompanied by Cath Lab RN. No s/s of acute distress noted, VS stable. Pt oriented to room/unit.  Wife at bedside.

## 2014-08-21 NOTE — Progress Notes (Signed)
Site area: rt groin Site Prior to Removal:  Level 0 Pressure Applied For: 25 minutes Manual:   yes Patient Status During Pull:  stable Post Pull Site:  Level  0 Post Pull Instructions Given:  yes Post Pull Pulses Present: yes Dressing Applied:  tegaderm Bedrest begins @ 4540 Comments: 0 complications.

## 2014-08-21 NOTE — Progress Notes (Signed)
Neuro assessment: Speech clear and appropriate. Alert and oriented X 3. Tongue midline. Smile symmetrical. Upper and lower extremity strength equal bilaterally. PERRL.

## 2014-08-21 NOTE — Progress Notes (Signed)
@  approx 2115 pt notified staff of sudden onset blurred vision. Pt was reclined flat in bed and BP cycled and found to be more hypotensive than earlier (80s/30s). Beyond blurred vision neuro exam benign. Blurred vision resolved after a few minutes of lying flat. Dr. Trula Slade, on-call for [name] paged and preceding information relayed to him. Orders received including bolus which is currently infusing. Groin site unchanged and neck soft and supple. Will continue to monitor and assess. Pt appears in no acute distress and is resting comfortably. Will hold anti-hypertensive medications tonight.

## 2014-08-21 NOTE — H&P (View-Only) (Signed)
[facility]  HISTORY AND PHYSICAL   History of Present Illness:  Patient is a 69 y.o. year old male who presents for evaluation of asymptomatic right internal carotid artery stenosis. The stenosis was noted on surveillance ultrasound. The patient denies prior history of stroke or TIA. He has no history of amaurosis. He did undergo a left carotid endarterectomy by Dr. Kellie Simmering in 2012. He was last seen here in April 2013. The patient is on aspirin and Brilinta. He had a cardiac catheterization last week and a mid left anterior descending stent placed. This was done for a decreased ejection fraction. The patient also has a history of atrial flutter. He was thought not to be a candidate for anticoagulation due to fall risk. He states that overall his balance has improved somewhat. His most recent ejection fraction was 25%. If this has not improved over the next few months and AICD may be considered. 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 This was in 2014. Other medical problems include end-stage renal disease and he is currently on peritoneal dialysis in the evenings. He also has a history of hypertension peripheral neuropathy diabetes all of which are currently controlled. He also states that general anesthesia has been difficult for him in the past as he has difficulty waking up. He had problems with this recently with placement of his peritoneal dialysis catheter. He currently places some heparin in his peritoneal dialysis solution. He does not take systemic heparin.  Past Medical History  Diagnosis Date  . Neuropathy   . H/O vitamin D deficiency   . Hearing loss   . Hypercholesteremia   . 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 S/P radiation therapy 01/24/2013-03/14/2013    Larynx/glottis / 63 Gy in 28 fractions  . Hypertension   . GERD (gastroesophageal reflux disease)   . Sleep apnea     to  request from Dr.Fried was done about 78yrs ago but no cpap d/t weight loss  . Fibromyalgia     takes tramadol daily as needed  . Myalgia and myositis   . Peripheral neuropathy   . History of colon polyps   . Cataracts, bilateral   . Dilated aortic root   . Aortic valve sclerosis   . Diastolic dysfunction   . Aortic regurgitation   . Complication of anesthesia     hard to wake up and then panics waking up  . Type II diabetes mellitus     "diet controlled now" (08/03/2014)  . Arthritis     "just age related" (08/03/2014)  . Coronary artery disease     severe CAD with mid RCA occlusion, distal LAD stenosis, moderate disease of the left circ. Repeat cath 07/2014 for worsening LVF showed severe disease of the mid LAD, mild to mod LCX disease, occluded PL of the RCA with left to right collaterals to distal RCA territory s/p PCI of the LAD with DES.  It was noted that if patient develops CP resistant to medical therapy could consider PCI of RCA  . ESRD on peritoneal dialysis 09/2013    "followed by [name]; [facility]" (08/03/2014)  . Carotid artery occlusion     s/p left CEA with 40-59% restenosis and >80% stenosis of the Right ICA - followed by Dr. Kellie Simmering  . Chronic combined systolic and diastolic CHF (congestive heart failure)     Past Surgical History  Procedure Laterality Date  . Cholecystectomy  1993  . Shoulder open rotator cuff repair Right 07/06/2011  . Carotid endarterectomy Left   . Colonoscopy w/ biopsies and polypectomy      Hx: of  . Microlaryngoscopy with laser N/A 01/04/2013    Procedure: MICROLARYNGOSCOPY WITH BIOPSY/LASER;  Surgeon: [name], MD;  Location: [facility];  Service: ENT;  Laterality: N/A;  . Av fistula placement Left 02/23/2013    Procedure: ARTERIOVENOUS (AV) FISTULA CREATION;  Surgeon: Conrad Starkville, MD;  Location: [facility];  Service: Vascular;  Laterality: Left;  . Capd insertion N/A 08/01/2013    Procedure: LAPAROSCOPIC INSERTION CONTINUOUS AMBULATORY  PERITONEAL DIALYSIS CATHETER;  Surgeon: [name], MD;  Location: [facility];  Service: General;  Laterality: N/A;  . Laparoscopic abdominal exploration N/A 08/01/2013    Procedure: LAPAROSCOPIC ABDOMINAL EXPLORATION;  Surgeon: [name], MD;  Location: [facility];  Service: General;  Laterality: N/A;  . Ulnar nerve transposition Left 02/22/2014    Procedure: LEFT ULNAR NERVE RELEASE AND OR TRANSPOSITION;  Surgeon: [name], MD;  Location: [facility];  Service: Orthopedics;  Laterality: Left;  . Left heart catheterization with coronary angiogram N/A 08/14/2013    Procedure: LEFT HEART CATHETERIZATION WITH CORONARY ANGIOGRAM;  Surgeon: [name], MD;  Location: [facility] CATH LAB;  Service: Cardiovascular;  Laterality: N/A;  . Coronary angioplasty  08/03/2014  . Cardiac catheterization  07/2013  . Tracheostomy  01/2013  . Tracheostomy closure  02/2013  . Left and right heart catheterization with coronary angiogram N/A 08/03/2014    Procedure: LEFT AND RIGHT HEART CATHETERIZATION WITH CORONARY ANGIOGRAM;  Surgeon: [name], MD;  Location: [facility] CATH LAB;  Service: Cardiovascular;  Laterality: N/A;    Social History History  Substance Use Topics  . Smoking status: Former Smoker -- 2.00 packs/day for 31 years    Types: Cigarettes    Quit date: 08/07/1995  . Smokeless tobacco: Never Used     Comment: quit smoking in 1997  . Alcohol Use: No    Family History Family History  Problem Relation Age of Onset  . Adopted: Yes  . Family history unknown: Yes    Allergies  Allergies  Allergen Reactions  . Amoxicillin     Causes upset stomach  . Fentanyl     Confusion when patch applied  . Moxifloxacin Other (See Comments)    unknown  . Nsaids Other (See Comments)    Severe kidney disease/ESRD     Current Outpatient Prescriptions  Medication Sig Dispense Refill  . acetaminophen (TYLENOL) 500 MG tablet Take 500 mg by mouth every 6 (six) hours as needed for mild pain or headache.      Marland Kitchen amLODipine (NORVASC) 5 MG tablet Take 1 tablet (5 mg total) by mouth daily. 30 tablet 6  . aspirin 81 MG tablet Take 81 mg by mouth daily.     . calcium acetate (PHOSLO) 667 MG capsule Take 1,334 mg by mouth 3 (three) times daily with meals.    . carvedilol (COREG) 25 MG tablet Take 1 tablet (25 mg total) by mouth 2 (two) times daily with a meal. 60 tablet 6  . esomeprazole (NEXIUM) 20 MG capsule Take 20 mg by mouth daily at 12 noon.    Marland Kitchen gentamicin cream (GARAMYCIN) 0.1 % Apply 1 application topically 2 (two) times daily.  99  . glipiZIDE (GLUCOTROL XL) 2.5 MG 24 hr tablet Take 2.5 mg by mouth daily as needed (diabetes).    Marland Kitchen  Heparin Sod, Porcine, in D5W (HEPARIN, PORCINE, IN D5W IV) Inject 5 mLs into the vein.    Marland Kitchen ipratropium (ATROVENT) 0.06 % nasal spray Place 2 sprays into both nostrils as needed for rhinitis.    Marland Kitchen losartan (COZAAR) 25 MG tablet Take 1 tablet (25 mg total) by mouth daily. 90 tablet 3  . multivitamin (RENA-VIT) TABS tablet Take 1 tablet by mouth daily.    . mupirocin cream (BACTROBAN) 2 % Apply 1 application topically as needed (for skin sores in the knee area).     . nitroGLYCERIN (NITROSTAT) 0.4 MG SL tablet Place 1 tablet (0.4 mg total) under the tongue every 5 (five) minutes as needed for chest pain. 25 tablet 2  . ticagrelor (BRILINTA) 90 MG TABS tablet Take 1 tablet (90 mg total) by mouth 2 (two) times daily. 60 tablet 10  . triamcinolone cream (KENALOG) 0.1 % Apply 1 application topically 2 (two) times daily. Apply to knee and lower leg.     No current facility-administered medications for this visit.    ROS:   General:  No weight loss, Fever, chills  HEENT: No recent headaches, no nasal bleeding, no visual changes, no sore throat  Neurologic: No dizziness, blackouts, seizures. No recent symptoms of stroke or mini- stroke. No recent episodes of slurred speech, or temporary blindness.  Cardiac: No recent episodes of chest pain/pressure, no shortness of  breath at rest.  No shortness of breath with exertion.  + history of atrial fibrillation or irregular heartbeat  Vascular: No history of rest pain in feet.  No history of claudication.  No history of non-healing ulcer, No history of DVT   Pulmonary: No home oxygen, no productive cough, no hemoptysis,  No asthma or wheezing  Musculoskeletal:  [ ]  Arthritis, [ ]  Low back pain,  [ x] Joint pain  Hematologic:No history of hypercoagulable state.  No history of easy bleeding.  No history of anemia  Gastrointestinal: No hematochezia or melena,  + gastroesophageal reflux, no trouble swallowing  Urinary: [x ] chronic Kidney disease, [ ]  on HD - [ ]  MWF or [ ]  TTHS, [ ]  Burning with urination, [ ]  Frequent urination, [ ]  Difficulty urinating;   Skin: No rashes  Psychological: No history of anxiety,  No history of depression   Physical Examination  Filed Vitals:   08/08/14 1014  BP: 120/67  Pulse: 79  Resp: 16  Height: 5\' 10"  (1.778 m)  Weight: 172 lb (78.019 kg)    Body mass index is 24.68 kg/(m^2).  General:  Alert and oriented, no acute distress HEENT: Normal Neck: Bilateral soft carotid bruits Pulmonary: Clear to auscultation bilaterally Cardiac: Regular Rate and Rhythm without murmur Abdomen: Soft, non-tender, slightly distended, no mass, left side peritoneal dialysis catheter  Skin: No rash Extremity Pulses:  2+ radial, brachial, femoral pulses bilaterally Musculoskeletal: No deformity or edema  Neurologic: Upper and lower extremity motor 5/5 and symmetric  DATA:  Carotid duplex scan was reviewed which shows greater than 80% stenosis right internal carotid artery velocities 418/132. Left carotid had minimal recurrent stenosis  ABI dated 12/18/2013 was reviewed. This was from our vascular lab which showed an ABI on the right of 0.9 to left of 0.72  I reviewed images of a CT scan of his neck from his prior laryngeal cancer treatment which shows he has essentially a type I  aortic arch from this study.   ASSESSMENT:  Asymptomatic high-grade right internal carotid artery stenosis. The patient has previously had  radiation to the neck. He also has concerns about general anesthesia from previous problems with this. He has also recently had coronary stenting. His ejection fraction is also depressed.   PLAN:  I believe the best option for this patient would be carotid stenting. Risks benefits possible complications and procedure details were explained to the patient and his wife today. These include but are not limited to bleeding infection stroke risk of 3-5%. He will continue Brilinta and aspirin. His carotid stent procedure is tentatively scheduled for 08/24/2014.  Ruta Hinds, MD [facility] [place] Office: [phone] Pager: [phone]

## 2014-08-21 NOTE — Interval H&P Note (Signed)
History and Physical Interval Note:  08/21/2014 7:32 AM  Victor Lane  has presented today for surgery, with the diagnosis of right sided/right ica stenosis  The various methods of treatment have been discussed with the patient and family. After consideration of risks, benefits and other options for treatment, the patient has consented to  Procedure(s): CAROTID STENT INSERTION (N/A) as a surgical intervention .  The patient's history has been reviewed, patient examined, no change in status, stable for surgery.  I have reviewed the patient's chart and labs.  Questions were answered to the patient's satisfaction.     Kimorah Ridolfi E

## 2014-08-21 NOTE — Progress Notes (Signed)
  [facility] Progress Note  08/21/2014 12:37 PM Day of Surgery  Subjective:  No complaints.   Filed Vitals:   08/21/14 1201  BP: 118/59  Pulse: 73  Temp: 98.1 F (36.7 C)  Resp: 27    Physical Exam: Neuro: tongue midline. No smile asymmetry. 5/5 strength upper and lower extremities No hematoma right groin.   Assessment:  69 y.o. male is s/p: right carotid angioplasty and stenting Day of Surgery  Plan: -Neuro exam intact. -Right groin without hematoma. -BP stable. -Nephrology seeing regarding dialysis -DM: oral meds, SSI -Dispo: anticipate d/c tomorrow   Virgina Jock, PA-C [facility] Office: [phone] Pager: [phone] 08/21/2014 12:37 PM

## 2014-08-21 NOTE — Progress Notes (Signed)
Neuro assessment unchanged; no deficits.  

## 2014-08-21 NOTE — Progress Notes (Signed)
No neuro deficits.

## 2014-08-21 NOTE — Progress Notes (Signed)
Paged Dr. Posey Pronto on-call for nephrology regarding pt's desire to wait on peritoneal dialysis secondary to SBPs 80s-100s. Dr. Tennis Ship returned paged and endorsed he was okay with pt's request. Will page appropriate staff on 6E to make aware of change.

## 2014-08-21 NOTE — Op Note (Signed)
Procedure: Arch and right carotid angiogram, right carotid angioplasty and stenting   Preoperative diagnosis: 80% right internal carotid artery stenosis  Postoperative diagnosis: Same   Anesthesia: Local  Co-surgeon: Annamarie Major, M.D.   Operative details: After obtaining informed consent, the patient was taken to the [facility] lab. The patient was placed in supine position the Angio table. Both groins were prepped and draped in usual sterile fashion. Local anesthesia was infiltrated over the right common femoral artery. An introducer needle was placed into the right common femoral artery and there was good backbleeding from this. A 0.035 Versacore wire was then threaded up the abdominal aorta under fluoroscopic guidance. A 5 French sheath was placed over the guidewire and the right common femoral artery and this was thoroughly flushed with heparin saline. A 5 French pig catheter was then placed over the guidewire and these were advanced as a unit up into the ascending aorta. The guidewire was removed and a arch aortogram obtained in a 60 LAO projection. This shows normal arch configuration with a type I arch. The common carotids are patent bilaterally with no significant stenosis. The left and right subclavian arteries are patent. The left and right vertebral arteries are patent. The left carotid bifurcation dissection well visualized and the internal/external on the left side are widely patent. At this point the 5 [place] Coude catheter was removed over a guidewire and replaced with a 5 [place] Berenstein 2 catheter. This was used to select I catheterize the innominate artery followed by the right common carotid artery. Guidewire was advanced into the right common carotid artery. The Berenstein catheter was advanced over this. The guidewire was then removed and a contrast angiogram was obtained of the right carotid system. This was done in AP and lateral projection. Intracranial views were also performed in the  AP and lateral projection for review but neuroradiologist. Additionally left and right oblique views were performed to lay out the carotid bifurcation. This shows a patent external carotid artery with a 50% origin stenosis. The internal carotid artery has a long lesion about 3 cm in length 80% stenosis at its tightest. At this point it was decided to intervene on the right carotid stenosis. An Angiomax infusion was begun. An 035 Amplatz wire was placed through the Berenstein catheter up into the distal right common carotid lesion. The 5 French short sheath was exchanged over the guidewire for a 6 [place] Cook shuttle sheath. The 6 French Cook shuttle sheath was then brought up on the operative field and advanced over the guidewire up into the ascending aorta and into the proximal common carotid. At this point it was noted that the guidewire had crossed the internal carotid stenosis. This was left in place and a 5 [place] JB1 catheter was then advanced up over the guidewire and the guidewire was then gently pulled back and the 6 [place] shuttle sheath advanced to a location just below the carotid bifurcation.  ACT was also checked and was rather than 325. At this point an 7 mm Abbott filter was brought on the operative field and this was advanced up to the level of the carotid stenosis.  With some manipulation the guidewire advanced through the stenosis with minimal difficulty. The filter was then advanced into the distal internal carotid artery at the level of the skull base. The filter was then deployed in this location. At this point a repeat contrast angiogram was performed to again determined the level of the stenosis. A 3 x 2 angioplasty  balloon was brought in operative field and advanced to the level of stenosis and quickly inflated and deflated to 10 atmospheres. The predilatation balloon was removed. A 9 x 40 Abbott exact stent was then brought in the operative field and advanced to the level of stenosis. Again  using angiographic guidance, the stent was deployed so that the tip of the stent was past the level of the high-grade stenosis. The stent was then deployed with the distal end of the stent past the stenosis and the proximal end of the stent approximately 20% into the common carotid artery. A postdilatation was then performed after the stent delivery device was removed. This was done with a 5 x 2 balloon inflated to 10 atmospheres for 5 seconds. The patient did experience some brief bradycardia. A half a milligram of atropine was given. There was no hypotension. A completion arteriogram was performed which showed that the residual stenosis was essentially 0 and the stent had good wall apposition. Completion intracranial views were also performed in AP and lateral projection to be interpreted later by the neuroradiologist. The patient tolerated procedure well and there were no complications. After the completion arteriogram had been performed, the sheath dilator was placed back in the sheath over a guidewire and these were then pulled down to the guidewire and the sheath exchanged for a 6 French short sheath in the right femoral artery. This was thoroughly flushed with heparinized saline and was left in place to be pulled in the holding area after the ACT is less than 175. The patient tolerated the procedure well and there were no neurologic complications. The patient was transported to the holding area in stable condition.   Ruta Hinds, MD  [facility] [place]  Office: [phone]  Pager: [phone]

## 2014-08-21 NOTE — Progress Notes (Addendum)
Rt fa 81F sheath site oozing steady moderate amt even after pressure dressing applied. [name] and lab staff at bedside changing out 81F for 36F sheath. No hematoma. No c/o discomfort.  Sheath sutured. Rt groin redressed. Level 0. Rt groin site tender. No bruising.

## 2014-08-21 NOTE — Progress Notes (Signed)
Neuro assessment unchanged. Wife in to see. Taking sips of ginger ale.

## 2014-08-22 LAB — RENAL FUNCTION PANEL
ALBUMIN: 2 g/dL — AB (ref 3.5–5.2)
Anion gap: 15 (ref 5–15)
BUN: 58 mg/dL — ABNORMAL HIGH (ref 6–23)
CHLORIDE: 98 mmol/L (ref 96–112)
CO2: 25 mmol/L (ref 19–32)
Calcium: 8.5 mg/dL (ref 8.4–10.5)
Creatinine, Ser: 10.45 mg/dL — ABNORMAL HIGH (ref 0.50–1.35)
GFR calc Af Amer: 5 mL/min — ABNORMAL LOW (ref >90)
GFR, EST NON AFRICAN AMERICAN: 4 mL/min — AB (ref >90)
Glucose, Bld: 90 mg/dL (ref 70–99)
PHOSPHORUS: 5.6 mg/dL — AB (ref 2.3–4.6)
Potassium: 5 mmol/L (ref 3.5–5.1)
SODIUM: 138 mmol/L (ref 135–145)

## 2014-08-22 LAB — CBC
HCT: 27.6 % — ABNORMAL LOW (ref 39.0–52.0)
Hemoglobin: 8.7 g/dL — ABNORMAL LOW (ref 13.0–17.0)
MCH: 33.1 pg (ref 26.0–34.0)
MCHC: 31.5 g/dL (ref 30.0–36.0)
MCV: 104.9 fL — ABNORMAL HIGH (ref 78.0–100.0)
Platelets: 181 10*3/uL (ref 150–400)
RBC: 2.63 MIL/uL — ABNORMAL LOW (ref 4.22–5.81)
RDW: 15.4 % (ref 11.5–15.5)
WBC: 7 10*3/uL (ref 4.0–10.5)

## 2014-08-22 LAB — GLUCOSE, CAPILLARY: GLUCOSE-CAPILLARY: 78 mg/dL (ref 70–99)

## 2014-08-22 MED ORDER — OXYCODONE HCL 5 MG PO TABS: 5.0000 mg | ORAL_TABLET | ORAL | Status: DC | PRN

## 2014-08-22 MED FILL — Sodium Chloride IV Soln 0.9%: INTRAVENOUS | Qty: 50 | Status: AC

## 2014-08-22 NOTE — Progress Notes (Addendum)
[facility] Progress Note  08/22/2014 8:01 AM 1 Day Post-Op  Subjective:  Some right neck discomfort. Denies pain. Denies any dizziness or lightheadedness.   Tmax 99.1 BP sys 80s-140s 02 95% RA  Filed Vitals:   08/22/14 0726  BP: 107/48  Pulse: 67  Temp:   Resp: 14    Physical Exam: Incisions:  Right groin soft without hematoma.  Neuro: Moving all extremities equally. 5/5 strength upper and lower. Smile symmetric. No tongue deviation. Vision clear.   CBC    Component Value Date/Time   WBC 6.4 08/04/2014 0412   WBC 8.5 03/09/2013 1520   RBC 2.99* 08/04/2014 0412   RBC 2.61* 03/09/2013 1520   RBC 3.71* 02/10/2013 1326   HGB 11.2* 08/21/2014 0612   HGB 7.7* 03/09/2013 1520   HCT 33.0* 08/21/2014 0612   HCT 23.5* 03/09/2013 1520   PLT 188 08/04/2014 0412   PLT 392 03/09/2013 1520   MCV 104.7* 08/04/2014 0412   MCV 90.2 03/09/2013 1520   MCH 33.4 08/04/2014 0412   MCH 29.6 03/09/2013 1520   MCHC 31.9 08/04/2014 0412   MCHC 32.9 03/09/2013 1520   RDW 16.9* 08/04/2014 0412   RDW 15.7* 03/09/2013 1520   LYMPHSABS 1.0 07/30/2014 1234   LYMPHSABS 1.2 03/09/2013 1520   MONOABS 0.7 07/30/2014 1234   MONOABS 0.7 03/09/2013 1520   EOSABS 0.3 07/30/2014 1234   EOSABS 0.1 03/09/2013 1520   BASOSABS 0.0 07/30/2014 1234   BASOSABS 0.1 03/09/2013 1520    BMET    Component Value Date/Time   NA 138 08/21/2014 0612   NA 141 03/09/2013 1520   K 4.2 08/21/2014 0612   K 4.8 03/09/2013 1520   CL 99 08/21/2014 0612   CO2 24 08/04/2014 0412   CO2 30* 03/09/2013 1520   GLUCOSE 98 08/21/2014 0612   GLUCOSE 110 03/09/2013 1520   BUN 45* 08/21/2014 0612   BUN 26.9* 03/09/2013 1520   CREATININE 8.60* 08/21/2014 0612   CREATININE 5.8 Repeated and Verified* 03/09/2013 1520   CALCIUM 8.5 08/04/2014 0412   CALCIUM 8.8 03/09/2013 1520   GFRNONAA 5* 08/04/2014 0412   GFRAA 6* 08/04/2014 0412    INR    Component Value Date/Time   INR 1.01 08/21/2014 0602      Intake/Output Summary (Last 24 hours) at 08/22/14 0801 Last data filed at 08/22/14 0600  Gross per 24 hour  Intake 1848.98 ml  Output     25 ml  Net 1823.98 ml     Assessment:  69 y.o. male is s/p: right carotid angioplasty and stenting 1 Day Post-Op  Plan: -BP meds held yesterday due to soft blood pressures. Patient refused dialysis due to low blood pressure. Off dopamine at 0200 this am. Will hold BP meds until patient follows up with nephrology next week. BP stable this am.  Advised him to take blood pressures daily and log results. -Bilateral visual blurriness resolved last night. Neuro exam intact this am. -Right groin without hematoma.  -Await morning labs.  -Has ambulated. -Discharge home today. Continue brilinta and aspirin. Will follow up with nephrology in one week and [name] in 2 weeks.     Virgina Jock, PA-C [facility] Office: [phone] Pager: [phone] 08/22/2014 8:01 AM     Hypotension last pm requiring dopamine briefly No hematoma groin BP low 100s this morning Neuro UE/LE 5/5 motor Brilinta/ASA Holding all BP meds for now per Dr Jimmy Footman. Pt will record BP at home and tell  Dr Moshe Cipro if trending up Acute blood loss anemia doubt this is reason for low BP more likely carotid body irritation since improving D/c home  Ruta Hinds, MD [facility] [place]: [phone] Pager: [phone]

## 2014-08-22 NOTE — Progress Notes (Signed)
REceived orders to d/c pt home. No s/s of acute distress noted, VS stable. D/c instructions given to pt and pt's wife, both verbalize understanding of teaching. Pt d/c'd home accompanied by wife.

## 2014-08-22 NOTE — Progress Notes (Signed)
Subjective: Interval History: has complaints refused PD last pm..  Objective: Vital signs in last 24 hours: Temp:  [97.5 F (36.4 C)-99.1 F (37.3 C)] 98.5 F (36.9 C) (03/02 0300) Pulse Rate:  [53-84] 75 (03/02 0630) Resp:  [8-27] 19 (03/02 0630) BP: (80-141)/(33-108) 117/51 mmHg (03/02 0630) SpO2:  [87 %-100 %] 100 % (03/02 0630) FiO2 (%):  [31 %-35 %] 35 % (03/02 0330) Weight:  [78.6 kg (173 lb 4.5 oz)-79.2 kg (174 lb 9.7 oz)] 79.2 kg (174 lb 9.7 oz) (03/02 0421) Weight change: 2.396 kg (5 lb 4.5 oz)  Intake/Output from previous day: 03/01 0701 - 03/02 0700 In: 1849 [P.O.:720; I.V.:129; IV Piggyback:1000] Out: 25 [Urine:25] Intake/Output this shift:    General appearance: alert, cooperative and anxious Resp: rales bibasilar Cardio: S1, S2 normal and systolic murmur: systolic ejection 2/6, decrescendo at 2nd left intercostal space GI: pos bs, soft, PD cath L mid abdm Extremities: bilat fem bruits  Lab Results:  Recent Labs  08/21/14 0612  HGB 11.2*  HCT 33.0*   BMET:  Recent Labs  08/21/14 0612  NA 138  K 4.2  CL 99  GLUCOSE 98  BUN 45*  CREATININE 8.60*   No results for input(s): PTH in the last 72 hours. Iron Studies: No results for input(s): IRON, TIBC, TRANSFERRIN, FERRITIN in the last 72 hours.  Studies/Results: No results found.  I have reviewed the patient's current medications.  Assessment/Plan: 1  ESRD refused PD.  BP was low but probably should have been done with low OSm fluids for clearance.  Liklihood of prob small 2 Low bp  Sedation, and xs antiHTN.  Had cut back yest, will D/C all bp meds at this time and f/u outpatient. 3 Anemia stable 4 Carotid dz 5 CAD 6 COPD 7 ^ Lipids P resume PD tonight,  Check chem, Stop AMLODIPINE, Carvedilol, and Losartan and do not send home on.     LOS: 1 day   Teralyn Mullins L 08/22/2014,7:26 AM

## 2014-08-23 ENCOUNTER — Telehealth: Payer: Self-pay | Admitting: Cardiology

## 2014-08-23 NOTE — Telephone Encounter (Signed)
New Message       Pt calling stating that he was released from the hosp yesterday after having a procedure and was taken off bp meds. Pt states that his bp is getting too high 162/72 and needs to be put on something. Please call back and advise.

## 2014-08-23 NOTE — Discharge Summary (Signed)
[facility] Discharge Summary  Victor Lane [birth year] 69 y.o. male  [id number]  Admission Date: 08/21/2014  Discharge Date: 08/22/2014  Physician: Ruta Hinds, MD  Admission Diagnosis: right sided right ica stenosis  HPI:   This is a 69 y.o. male presented for evaluation of asymptomatic right internal carotid artery stenosis. The stenosis was noted on surveillance ultrasound. The patient denies prior history of stroke or TIA. He has no history of amaurosis. He did undergo a left carotid endarterectomy by Dr. Kellie Simmering in 2012. He was last seen here in April 2013. The patient is on aspirin and Brilinta. He had a cardiac catheterization last week and a mid left anterior descending stent placed. This was done for a decreased ejection fraction. The patient also has a history of atrial flutter. He was thought not to be a candidate for anticoagulation due to fall risk. He states that overall his balance has improved somewhat. His most recent ejection fraction was 25%. If this has not improved over the next few months and AICD may be considered. 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 This was in 2014. Other medical problems include end-stage renal disease and he is currently on peritoneal dialysis in the evenings. He also has a history of hypertension peripheral neuropathy diabetes all of which are currently controlled. He also states that general anesthesia has been difficult for him in the past as he has difficulty waking up. He had problems with this recently with placement of his peritoneal dialysis catheter. He currently places some heparin in his peritoneal dialysis solution. He does not take systemic heparin.  Hospital Course:  The patient was admitted to the hospital and taken to the operating room on 08/21/2014 and underwent right carotid endarterectomy.  The patient tolerated the procedure well and was transported to the PACU in stable condition.  Nephrology was consulted regarding his peritoneal dialysis. He had soft blood pressures with systolic 48N and his blood pressure meds were reduced by nephrology. The patient refused dialysis that evening due to low blood pressure. He briefly required dopamine overnight.   By POD 1, the patient's neuro status was intact. His right groin incision was clean without presence of hematoma.  His blood pressure had stabilized and he was off dopamine. It was believed that his hypotension was likely carotid body irritation previously as his blood pressure was improving versus acute blood loss anemia. His blood pressure medications were held due to previous low blood pressure per nephrology. He is to follow up with nephrology in the next week. He was advised to take his blood pressure daily and to record his results. He had ambulated without difficulty.  He was to continue with brilinta and aspirin. He was discharged home on POD 1 in good condition.   The remainder of the hospital course consisted of increasing mobilization and increasing intake of solids without difficulty.    Recent Labs  08/21/14 0612 08/22/14 0810  NA 138 138  K 4.2 5.0  CL 99 98  CO2  --  25  GLUCOSE 98 90  BUN 45* 58*  CALCIUM  --  8.5    Recent Labs  08/21/14 0612 08/22/14 0810  WBC  --  7.0  HGB 11.2* 8.7*  HCT 33.0* 27.6*  PLT  --  181    Recent Labs  08/21/14 0602  INR 1.01    Discharge Instructions:   The patient is discharged to home with extensive instructions on wound  care and progressive ambulation.  They are instructed not to drive or perform any heavy lifting until returning to see the physician in his office.  Discharge Instructions    CAROTID Sugery: Call MD for difficulty swallowing or speaking; weakness in arms or legs that is a new symtom; severe headache.  If you have increased swelling in the neck and/or  are having difficulty breathing, CALL [phone]    Complete by:  As directed      Call MD for:   redness, tenderness, or signs of infection (pain, swelling, bleeding, redness, odor or green/yellow discharge around incision site)    Complete by:  As directed      Call MD for:  severe or increased pain, loss or decreased feeling  in affected limb(s)    Complete by:  As directed      Call MD for:  temperature >100.5    Complete by:  As directed      Discharge wound care:    Complete by:  As directed   May shower tomorrow. Leave dressing on today. Wash groin with soap and water gently and pat dry. You do not have to reapply a dressing.     Driving Restrictions    Complete by:  As directed   No driving for 1 week     Increase activity slowly    Complete by:  As directed   Walk with assistance use walker or cane as needed     Lifting restrictions    Complete by:  As directed   No lifting greater than gallon of milk for 1 week     Resume previous diet    Complete by:  As directed            Discharge Diagnosis:  right sided right ica stenosis  Secondary Diagnosis: Patient Active Problem List   Diagnosis Date Noted  . Carotid stenosis 08/21/2014  . Chronic systolic congestive heart failure   . Dyslipidemia 07/30/2014  . Aortic regurgitation   . CAD (coronary artery disease), native coronary artery 11/20/2013  . Cardiomyopathy, ischemic 11/20/2013  . Dilated aortic root   . Chronic respiratory failure 08/29/2013  . ESRD on dialysis 08/29/2013  . Paroxysmal atrial flutter 08/10/2013  . Thrombocytopenia 08/10/2013  . Leukopenia 08/10/2013  . CAPD (continuous ambulatory peritoneal dialysis) catheter in place 08/07/2013  . Severe sepsis(995.92) 08/06/2013  . Peritonitis 08/06/2013  . HCAP (healthcare-associated pneumonia) 08/06/2013  . Diastasis recti 07/11/2013  . COPD GOLD II 06/29/2013  . Hepatitis 05/22/2013  . Leg weakness, bilateral 04/19/2013  . Pain in limb 04/19/2013  . Neuropathy 04/19/2013  . Lumbago 04/19/2013  . Other malaise and fatigue 04/19/2013  .  Dysphagia 02/16/2013  . Protein-calorie malnutrition, severe 02/12/2013  . Diabetes mellitus 02/10/2013  . HTN (hypertension) 02/10/2013  . ESRD (end stage renal disease) on dialysis 02/10/2013  . Anemia of chronic disease 02/10/2013  . Transaminitis 02/10/2013  . 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 Carotid artery stenosis 03/24/2011   Past Medical History  Diagnosis Date  . Neuropathy   . H/O vitamin D deficiency   . Hearing loss   . Hypercholesteremia   . 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 S/P radiation therapy 01/24/2013-03/14/2013    Larynx/glottis / 63 Gy in 28 fractions  . Hypertension   . GERD (gastroesophageal reflux disease)   . Sleep apnea     to request from Dr.Fried was  done about 33yrs ago but no cpap d/t weight loss  . Fibromyalgia     takes tramadol daily as needed  . Myalgia and myositis   . Peripheral neuropathy   . History of colon polyps   . Cataracts, bilateral   . Dilated aortic root   . Aortic valve sclerosis   . Diastolic dysfunction   . Aortic regurgitation   . Complication of anesthesia     hard to wake up and then panics waking up  . Type II diabetes mellitus     "diet controlled now" (08/03/2014)  . Arthritis     "just age related" (08/03/2014)  . Coronary artery disease     severe CAD with mid RCA occlusion, distal LAD stenosis, moderate disease of the left circ. Repeat cath 07/2014 for worsening LVF showed severe disease of the mid LAD, mild to mod LCX disease, occluded PL of the RCA with left to right collaterals to distal RCA territory s/p PCI of the LAD with DES.  It was noted that if patient develops CP resistant to medical therapy could consider PCI of RCA  . ESRD on peritoneal dialysis 09/2013    "followed by [name]; [facility]" (08/03/2014)  . Carotid artery occlusion     s/p left CEA with 40-59% restenosis and >80% stenosis of the Right ICA - followed by Dr. Kellie Simmering  .  Chronic combined systolic and diastolic CHF (congestive heart failure)       Medication List    STOP taking these medications        amLODipine 5 MG tablet  Commonly known as:  NORVASC     carvedilol 25 MG tablet  Commonly known as:  COREG     losartan 25 MG tablet  Commonly known as:  COZAAR      TAKE these medications        acetaminophen 500 MG tablet  Commonly known as:  TYLENOL  Take 1,000 mg by mouth every 6 (six) hours as needed for mild pain or headache.     ALPRAZolam 0.5 MG tablet  Commonly known as:  XANAX  Take 0.5 mg by mouth at bedtime.     aspirin EC 81 MG tablet  Take 81 mg by mouth daily.     calcium acetate 667 MG capsule  Commonly known as:  PHOSLO  Take 1,334 mg by mouth See admin instructions. Take 2 capsules (1334 mg) with meals and 1 capsule (667 mg) with snacks     esomeprazole 20 MG capsule  Commonly known as:  NEXIUM  Take 20 mg by mouth daily before breakfast.     glipiZIDE 2.5 MG 24 hr tablet  Commonly known as:  GLUCOTROL XL  Take 2.5 mg by mouth daily as needed (CBG over 140).     HEPARIN (PORCINE) IN D5W IV  Inject 5 mLs into the vein daily as needed (with dialysis).     ipratropium 0.06 % nasal spray  Commonly known as:  ATROVENT  Place 2 sprays into both nostrils 2 (two) times daily.     multivitamin Tabs tablet  Take 1 tablet by mouth daily.     mupirocin ointment 2 %  Commonly known as:  BACTROBAN  Apply 1 application topically 2 (two) times daily as needed (rash).     nitroGLYCERIN 0.4 MG SL tablet  Commonly known as:  NITROSTAT  Place 1 tablet (0.4 mg total) under the tongue every 5 (five) minutes as needed for chest pain.  oxyCODONE 5 MG immediate release tablet  Commonly known as:  Oxy IR/ROXICODONE  Take 1-2 tablets (5-10 mg total) by mouth every 4 (four) hours as needed for moderate pain.     rosuvastatin 5 MG tablet  Commonly known as:  CRESTOR  Take 1 tablet by mouth as directed up to once daily      ticagrelor 90 MG Tabs tablet  Commonly known as:  BRILINTA  Take 1 tablet (90 mg total) by mouth 2 (two) times daily.     traMADol 50 MG tablet  Commonly known as:  ULTRAM  Take 100 mg by mouth every 4 (four) hours as needed (pain).     triamcinolone cream 0.1 %  Commonly known as:  KENALOG  Apply 1 application topically 2 (two) times daily as needed (rash).     zolpidem 5 MG tablet  Commonly known as:  AMBIEN  Take 5 mg by mouth at bedtime as needed for sleep.        Roxicodone #20 No Refill  Disposition: Home  Patient's condition: is Good  Follow up: 1. Dr.  Oneida Alar in 2 weeks.   Virgina Jock, PA-C [facility] [phone]  --- For [facility] use --- Instructions: Press F2 to tab through selections.  Delete question if not applicable.   Modified Rankin score at D/C (0-6): 0  IV medication needed for:  1. Hypertension: No 2. Hypotension: Yes  Post-op Complications: No  1. Post-op CVA or TIA: No  2. CN injury: No  3. Myocardial infarction: No  4.  CHF: No  5.  Dysrhythmia (new): No  6. Wound infection: No  7. Reperfusion symptoms: No  8. Return to OR: No  Discharge medications: Statin use:  Yes If No: [ ]  For Medical reasons, [ ]  Non-compliant, [ ]  Not-indicated ASA use:  Yes  If No: [ ]  For Medical reasons, [ ]  Non-compliant, [ ]  Not-indicated Beta blocker use:  Yes If No: [ ]  For Medical reasons, [ ]  Non-compliant, [ ]  Not-indicated ACE-Inhibitor use:  No, on ARB If No: [ ]  For Medical reasons, [ ]  Non-compliant, [ ]  Not-indicated P2Y12 Antagonist use: Yes, [ ]  Plavix, [ ]  Plasugrel, [ ]  Ticlopinine, [x]  Ticagrelor, [ ]  Other, [ ]  No for medical reason, [ ]  Non-compliant, [ ]  Not-indicated Anti-coagulant use:  No, [ ]  Warfarin, [ ]  Rivaroxaban, [ ]  Dabigatran, [ ]  Other, [ ]  No for medical reason, [ ]  Non-compliant, [x ] Not-indicated

## 2014-08-23 NOTE — Telephone Encounter (Signed)
Patient was discharge from hospital yesterday and his medications Norvasc, Coreg, and Cozaar were stopped due to patient's low BP at 107/48 while in the hospital. Patient's BP today was162/72 this am and this afternoon, after taking Norvasc and Coreg on his own, BP is 154/65 HR 89. Will forward to Dr. Radford Pax.

## 2014-08-24 ENCOUNTER — Ambulatory Visit: Payer: Medicare Other | Admitting: Physician Assistant

## 2014-08-24 MED ORDER — AMLODIPINE BESYLATE 5 MG PO TABS: 5.0000 mg | ORAL_TABLET | Freq: Every day | ORAL | Status: DC

## 2014-08-24 MED ORDER — CARVEDILOL 25 MG PO TABS: 25.0000 mg | ORAL_TABLET | Freq: Two times a day (BID) | ORAL | Status: DC

## 2014-08-24 MED ORDER — LOSARTAN POTASSIUM 25 MG PO TABS: 25.0000 mg | ORAL_TABLET | Freq: Every day | ORAL | Status: DC

## 2014-08-24 NOTE — Telephone Encounter (Signed)
Instructed patient to continue amlodipine and coreg and restart losartan.  Patient is to check BP daily for one week and call with results.

## 2014-08-24 NOTE — Telephone Encounter (Signed)
Continue amlodipine and COreg and restart Losartan.  CHeck BP daily for a week and call with results

## 2014-08-27 ENCOUNTER — Telehealth: Payer: Self-pay

## 2014-08-27 NOTE — Telephone Encounter (Signed)
Phone call from pt.  Reported he has a "dull and annoying pain" in the right side of his face and down into his jaw.  Reported he has an intermittent "mild" headache, right eye and earache, and jaw ache.  Stated it has improved from initial period after the procedure, but "it comes and goes".  Rates the discomfort at a "2-3".  Denies any vision change, unilateral weakness in extremities, speech problems, or any other neurological deficits. Discussed with Dr. Trula Slade.  Advised to give these symptoms more time, and they should resolve.  Advised pt. of Dr. Stephens Shire recommendations.  Enc. to call if symptoms worsen/ don't improve.  Verb. Understanding

## 2014-08-29 ENCOUNTER — Telehealth: Payer: Self-pay

## 2014-08-29 NOTE — Telephone Encounter (Signed)
Phone call from pt.  Requested to get a refill on Oxycodone 5 mg.  Stated he continues to have a "suttle earache, toothache and right-sided facial discomfort.  Stated the headache is gone.  Also reported the swallowing is better.  Stated he feels cold at times and temperature is "slightly elevated at 98.6." (stated he usually has temp. at 97 degree range)  Questioned if he has tried any OTC pain medication.  Stated "I have taken Tramadol, but it doesn't work as well as the Oxycodone."  Advised pt. That he should be evaluated in office prior to giving any additional pain medication.  Stated he is going to be at another appt. In our area today, but just wanted to pick up a prescription.  Advised pt. [name] is not in the office today; will call pt. in AM for status report, and discuss his symptoms with [name] at that time.  Agrees with plan.

## 2014-08-30 NOTE — Telephone Encounter (Signed)
Called pt. to check on status.  Reported since taking Tramadol, his toothache and earache are improved.  Stated temp. this AM is 97.8 degrees.  Reported he has a slight headache; stated he thinks he has sinus issues contributing to his headache.  Pt. stated that he feels like the Tramadol is strong enough to manage his pain.  Updated [name] with pt's complaints.  Recommended to contact PCP if symptoms persist, as unusual to have this amt. of discomfort post carotid stent.  Notified pt. of [name] recommendations.  Verb. understanding.

## 2014-08-31 IMAGING — CR DG CHEST 2V
2 series · 2 of 2 positions shown · non-contrast
Comparison: none

[w chest lat]
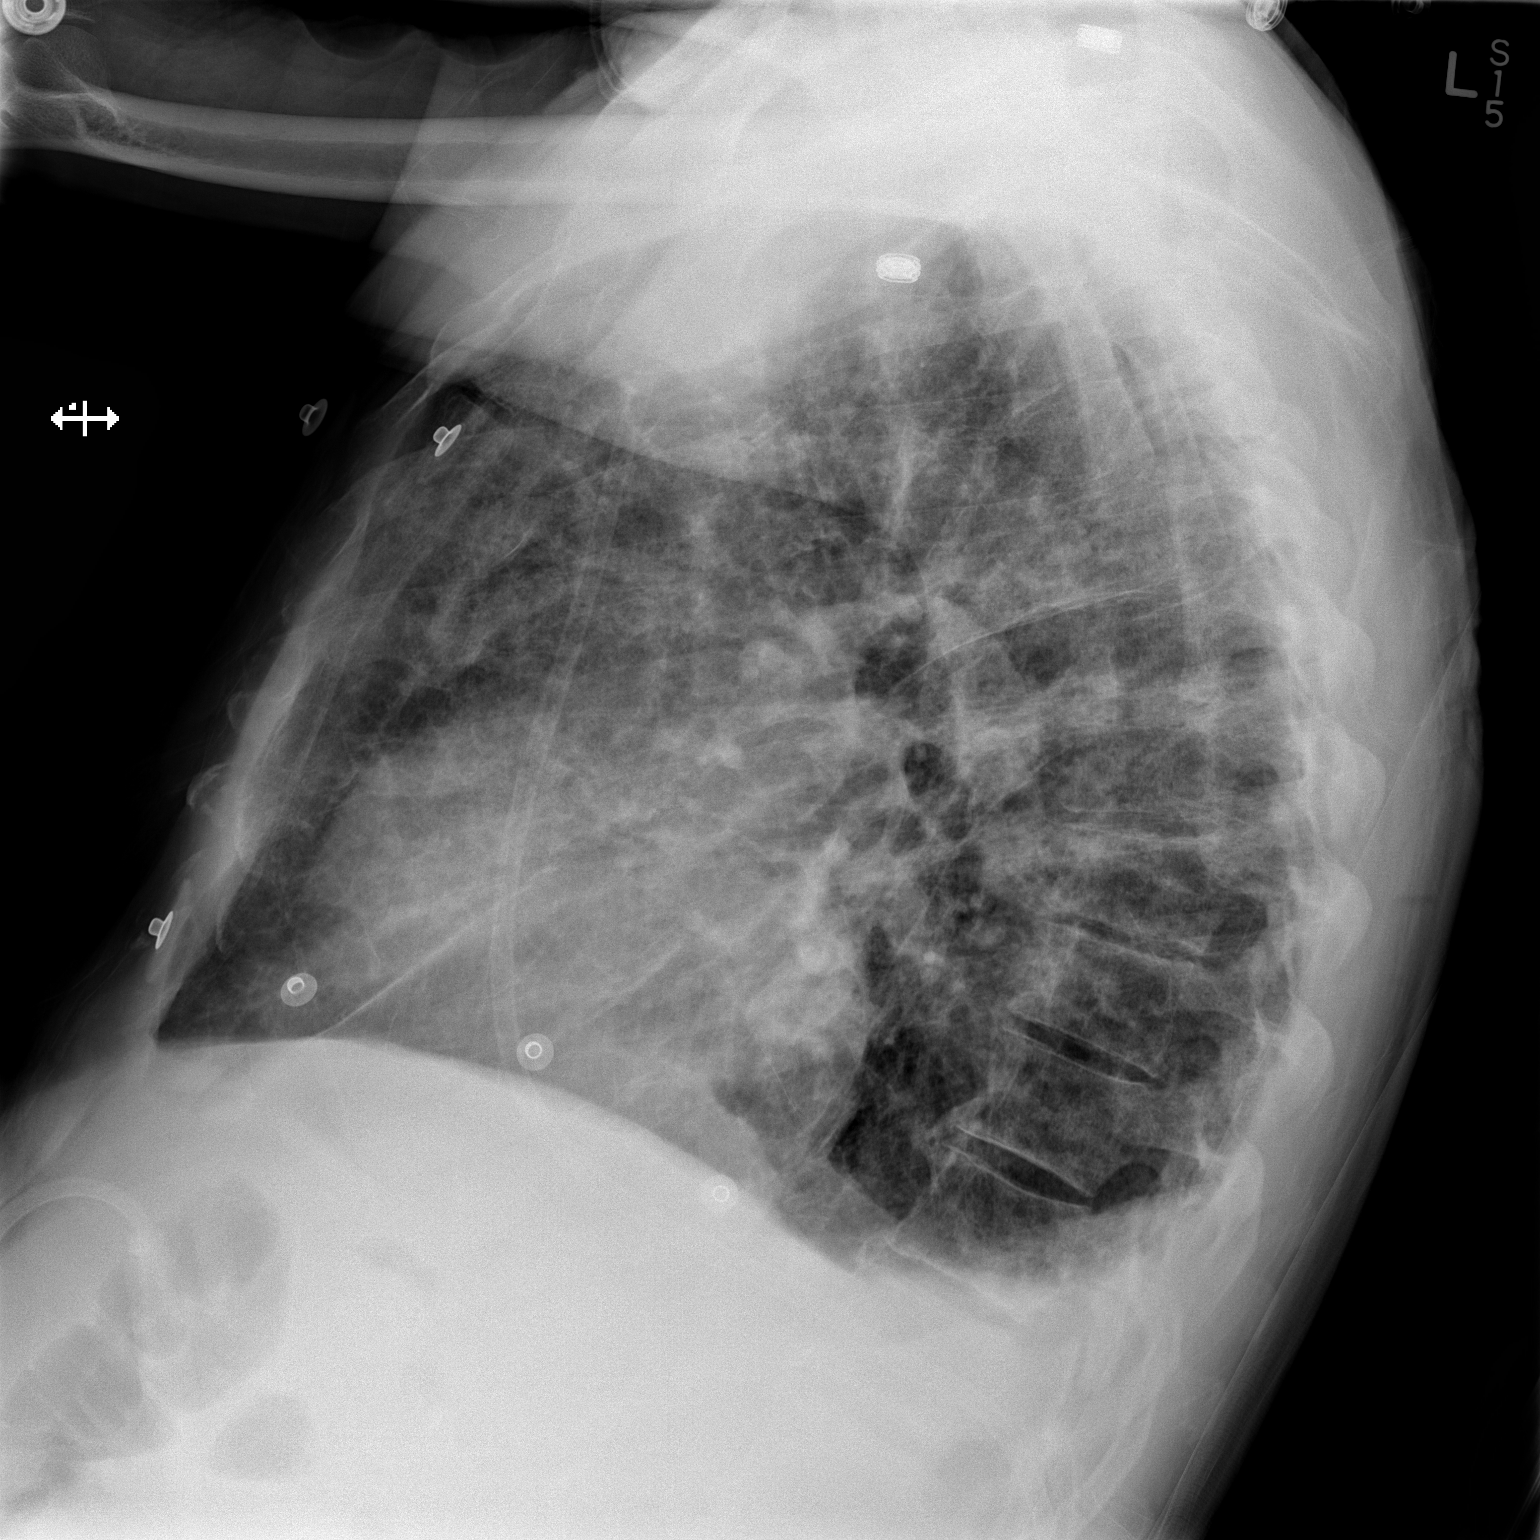

[x chest ap]
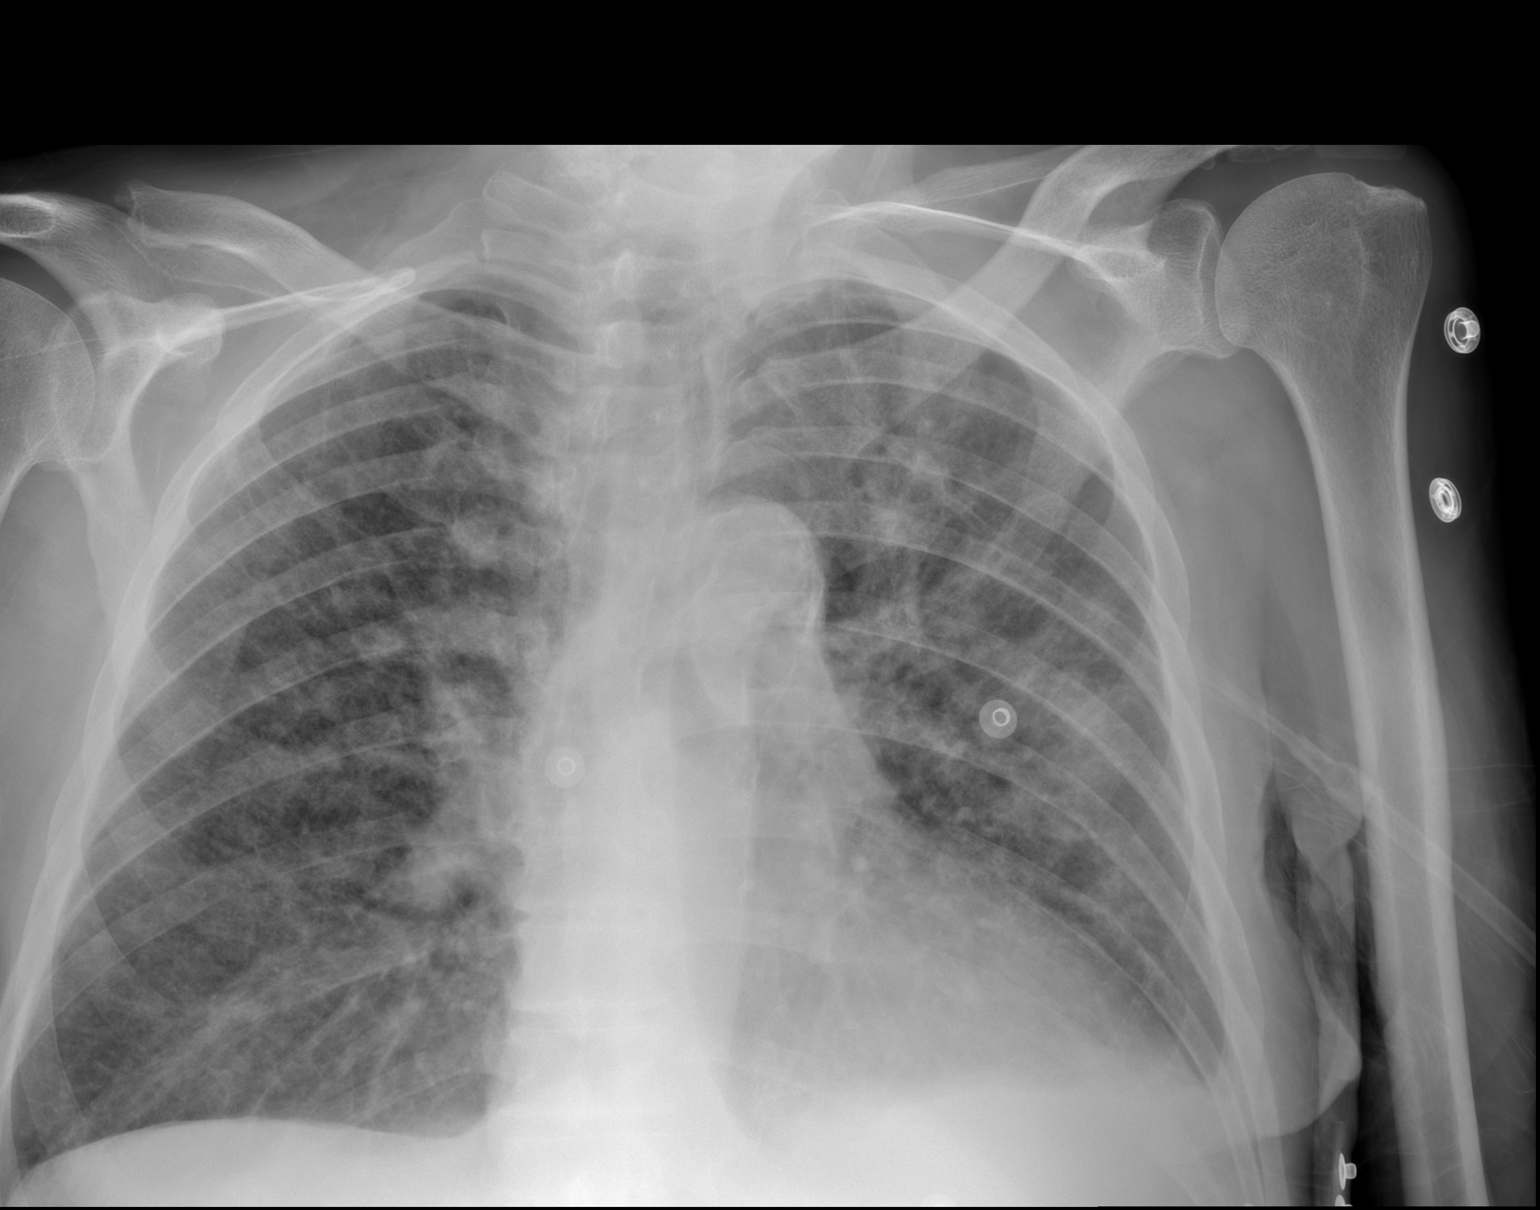

[2 of 2 positions shown; findings below may reference images not displayed]

CLINICAL DATA
Chest pain and dyspnea and weakness, status post dialysis; history
of vocal cord malignancy with radiation therapy, history of COPD

EXAM
CHEST  2 VIEW

COMPARISON
DG CHEST 2 VIEW dated 08/15/2013

FINDINGS
The lungs are adequately inflated. The interstitial markings are
diffusely increased and areas of confluent alveolar density are
present. There small bilateral pleural effusions. The cardiac
silhouette is mildly enlarged. The pulmonary vascularity is
indistinct. There is no pneumothorax. The observed portions of the
bony thorax exhibit no acute abnormalities.

IMPRESSION
The findings are consistent with congestive heart failure and
pulmonary interstitial and alveolar edema and small bilateral
pleural effusions. The appearance of the pulmonary interstitium has
not dramatically changed since the previous study.

SIGNATURE

## 2014-09-04 ENCOUNTER — Encounter: Payer: Medicare Other | Admitting: Vascular Surgery

## 2014-09-04 ENCOUNTER — Other Ambulatory Visit (HOSPITAL_COMMUNITY): Payer: Medicare Other

## 2014-09-05 ENCOUNTER — Encounter: Payer: Self-pay | Admitting: Vascular Surgery

## 2014-09-05 ENCOUNTER — Ambulatory Visit (INDEPENDENT_AMBULATORY_CARE_PROVIDER_SITE_OTHER): Payer: Self-pay | Admitting: Vascular Surgery

## 2014-09-05 ENCOUNTER — Telehealth: Payer: Self-pay | Admitting: Cardiology

## 2014-09-05 VITALS — BP 163/74 | HR 88 | Resp 16 | Ht 70.0 in | Wt 171.0 lb

## 2014-09-05 DIAGNOSIS — I255 Ischemic cardiomyopathy: Secondary | ICD-10-CM

## 2014-09-05 DIAGNOSIS — I6523 Occlusion and stenosis of bilateral carotid arteries: Secondary | ICD-10-CM

## 2014-09-05 NOTE — Telephone Encounter (Signed)
Patient st that the coreg dosage does not matter, he hasn't felt well since he started the coreg. He st he vomited his coreg this morning and felt better throughout the day.  Patient does not want to take the medication anymore.  He said he is more than happy to try something different.   To Dr. Radford Pax for recommendations.

## 2014-09-05 NOTE — Telephone Encounter (Signed)
Called patient about his medication Coreg. Patient is experiencing nausea, vomiting, dry skin, weakness, and SOB after taking Coreg. Patient states that he thinks this is because of his Coreg and it's recent increase. Patient's SBP was 220, then after taking medication SBP was 140's, but HR stable in the 80's. Patient wants to know if there is something else he can take. Patient realizes that he needs to take something for his elevated BP, but feels that the Coreg is making him sick. Will forward to Dr. Radford Pax for further instructions.

## 2014-09-05 NOTE — Telephone Encounter (Signed)
Did he have these symptoms on the lower dose of coreg

## 2014-09-05 NOTE — Telephone Encounter (Signed)
Change to lopressor 25mg  BID and have him check his BP and HR daily for a week and call with results

## 2014-09-05 NOTE — Progress Notes (Signed)
She is a 69 year old male who is status post right carotid stent on March 1. He returns today for postoperative follow-up. He has been having trouble with nausea and occasional vomiting. He wonders whether or not this might be medication related. None of his medications were changed probably was in the hospital. He states his diabetes has been well controlled. He denies any abdominal pain. He states this does not feel like a PD catheter infection. He has follow-up scheduled with Dr. Radford Pax in the near future regarding his hypertension. He is scheduled to see [name] with nephrology tomorrow. He denies any symptoms of TIA amaurosis or stroke. He had some soreness in the groin early on after his procedure but this has now resolved. She had a left carotid endarterectomy as well. Most recent duplex scan showed less than 40% stenosis. He is currently on Brilinta and aspirin for antiplatelet therapy. His Brilinta was started after recent coronary procedure.  Current Outpatient Prescriptions on File Prior to Visit  Medication Sig Dispense Refill  . acetaminophen (TYLENOL) 500 MG tablet Take 1,000 mg by mouth every 6 (six) hours as needed for mild pain or headache.     . ALPRAZolam (XANAX) 0.5 MG tablet Take 0.5 mg by mouth at bedtime.   2  . amLODipine (NORVASC) 5 MG tablet Take 1 tablet (5 mg total) by mouth daily. 180 tablet 3  . aspirin EC 81 MG tablet Take 81 mg by mouth daily.    . calcium acetate (PHOSLO) 667 MG capsule Take 1,334 mg by mouth See admin instructions. Take 2 capsules (1334 mg) with meals and 1 capsule (667 mg) with snacks    . carvedilol (COREG) 25 MG tablet Take 1 tablet (25 mg total) by mouth 2 (two) times daily. 180 tablet 3  . esomeprazole (NEXIUM) 20 MG capsule Take 20 mg by mouth daily before breakfast.     . glipiZIDE (GLUCOTROL XL) 2.5 MG 24 hr tablet Take 2.5 mg by mouth daily as needed (CBG over 140).     . Heparin Sod, Porcine, in D5W (HEPARIN, PORCINE, IN D5W IV) Inject  5 mLs into the vein daily as needed (with dialysis).     Marland Kitchen ipratropium (ATROVENT) 0.06 % nasal spray Place 2 sprays into both nostrils 2 (two) times daily.     Marland Kitchen losartan (COZAAR) 25 MG tablet Take 1 tablet (25 mg total) by mouth daily. 90 tablet 3  . multivitamin (RENA-VIT) TABS tablet Take 1 tablet by mouth daily.    . mupirocin ointment (BACTROBAN) 2 % Apply 1 application topically 2 (two) times daily as needed (rash).     . nitroGLYCERIN (NITROSTAT) 0.4 MG SL tablet Place 1 tablet (0.4 mg total) under the tongue every 5 (five) minutes as needed for chest pain. 25 tablet 2  . rosuvastatin (CRESTOR) 5 MG tablet Take 1 tablet by mouth as directed up to once daily (Patient taking differently: Take 5 mg by mouth 3 (three) times a week. Monday, Wednesday, Friday at noon) 90 tablet 3  . ticagrelor (BRILINTA) 90 MG TABS tablet Take 1 tablet (90 mg total) by mouth 2 (two) times daily. 60 tablet 10  . traMADol (ULTRAM) 50 MG tablet Take 100 mg by mouth every 4 (four) hours as needed (pain).    . triamcinolone cream (KENALOG) 0.1 % Apply 1 application topically 2 (two) times daily as needed (rash).     . zolpidem (AMBIEN) 5 MG tablet Take 5 mg by mouth at bedtime as needed for  sleep.     . oxyCODONE (OXY IR/ROXICODONE) 5 MG immediate release tablet Take 1-2 tablets (5-10 mg total) by mouth every 4 (four) hours as needed for moderate pain. (Patient not taking: Reported on 09/05/2014) 20 tablet 0   No current facility-administered medications on file prior to visit.   Physical exam:  Filed Vitals:   09/05/14 1445  BP: 163/74  Pulse: 88  Resp: 16  Height: 5\' 10"  (1.778 m)  Weight: 171 lb (77.565 kg)   Neck: Bilateral soft carotid bruits 2+ carotid pulses well-healed left neck scar  Neuro: Symmetric upper extremity and lower extremity motor strength which is 5 over 5  Extremities: 2+ femoral pulse right groin no hematoma  Assessment: Doing well status post right carotid stent and prior left  carotid endarterectomy. He is currently frustrated by his nausea symptoms.  Plan: Follow-up with me in 6 months time with repeat carotid duplex scan at that time. Continue his Brilinta and aspirin for now.  Ruta Hinds, MD [facility] [place] Office: [phone] Pager: [phone]

## 2014-09-05 NOTE — Telephone Encounter (Signed)
Pt c/o medication issue:  1. Name of Medication: Coreg  2. How are you currently taking this medication (dosage and times per day)? 25mg  2 x per day  3. Are you having a reaction (difficulty breathing--STAT)? Sob  4. What is your medication issue? [name] calling stating that pt is experiencing nausea, vomiting, dry skin, very tired, and sob. Pt is vomiting about 30 minutes after most meals.

## 2014-09-06 MED ORDER — METOPROLOL TARTRATE 25 MG PO TABS: 25.0000 mg | ORAL_TABLET | Freq: Two times a day (BID) | ORAL | Status: DC

## 2014-09-06 NOTE — Telephone Encounter (Signed)
Instructed patient to STOP COREG and START LOPRESSOR 25 mg BID and to check his BP and HR daily for a week and call with results. Patient agrees with treatment plan.

## 2014-09-09 NOTE — Progress Notes (Signed)
Cardiology Office Note   Date:  09/10/2014   ID:  Victor Lane, Victor Lane 1945/07/10, MRN [id number]  PCP:  [name], MD  Cardiologist:  Dr. Fransico Him     Chief Complaint  Patient presents with  . 2 WK F/U VISIT    Hypertension  . Cardiomyopathy     History of Present Illness: Victor Lane is a 69 y.o. male with a hx of CAD with known RCA occlusion treated medically, ESRD on peritoneal dialysis, ICM, systolic HF, paroxysmal AFlutter (not on anticoagulation due to frailty), carotid stenosis, aortic insufficiency, mod pulmonary HTN, HL, HTN.  He recently underwent FU echo that demonstrated worsening LVF.  He was referred for LHC that demonstrated severe mid LAD stenosis, mild to mod disease of LCx, and occluded PL of the RCA with L>R collats. He underwent PCI with DES to LAD.  Last seen by Dr. Fransico Him post PCI.  His beta-blocker dose was advanced. Since that time, he was noted to have severe RICA stenosis.  He underwent RICA PTA and stenting 08/21/14 with [name].    He returns for management of HTN.  Since he was last seen in the office, his carvedilol was changed to metoprolol due to side effects. He felt more short of breath with this. He also felt weak. Since changing to metoprolol, he feels better. He also notes that his EDW has been changed. Since this, his breathing has improved. Overall he is NYHA 2b. He denies orthopnea, PND or edema. He does have pleuritic chest pain when he is volume overloaded. This is improved. He denies exertional chest pain or jaw pain. He denies syncope.  Studies/Reports Reviewed Today:  Carotid US 08/08/14 R 80-99%  Echo 07/24/14 - Mild LVH. EF 20-25%. Diffuse hypokinesis. Grade 2 diastolic dysfunction - Aortic valve: There was mild regurgitation. - Aortic root: The aortic root was mildly dilated. - Mitral valve: Calcified annulus. There was mild to moderate regurgitation. Valve area by continuity equation (using LVOT flow): 1.76 cm^2. -  Left atrium: The atrium was moderately dilated. - Pericardium, extracardiac: There was a left pleural effusion.  Impressions: Severe global reduction in LV function; grade 2 diastolic dysfunction with elevated filling pressure; moderate LAE; mild to moderate MR; mild AI.  Cardiac Cath/PCI 08/03/14 LM:  patent. LAD: mid 90% and calcified. Left to right collaterals feeding the distal RCA system. LCx:  moderate lesion proximally.  RCA:  The posterior lateral artery is occluded. Left to right collaterals which fill the distal RCA system. There are some right to right collaterals as well.  LVEDP was 19 mmHg. PCI:   2.5 x 38 Synergy drug-eluting stent to mid LAD.  If patient develops angina which is refractory to medical therapy, could consider CTO PCI of the distal RCA.  Myoview 07/2013 IMPRESSION: Reversible myocardial perfusion defects at the inferior/inferolateral and anteroseptal walls of the left ventricle. Mildly decreased left ventricular ejection fraction of 38%. No focal wall motion abnormalities.   Past Medical History  Diagnosis Date  . Neuropathy   . H/O vitamin D deficiency   . Hearing loss   . Hypercholesteremia   . 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 S/P radiation therapy 01/24/2013-03/14/2013    Larynx/glottis / 63 Gy in 28 fractions  . Hypertension   . GERD (gastroesophageal reflux disease)   . Sleep apnea     to request from Dr.Fried was done about  42yrs ago but no cpap d/t weight loss  . Fibromyalgia     takes tramadol daily as needed  . Myalgia and myositis   . Peripheral neuropathy   . History of colon polyps   . Cataracts, bilateral   . Dilated aortic root   . Aortic valve sclerosis   . Diastolic dysfunction   . Aortic regurgitation   . Complication of anesthesia     hard to wake up and then panics waking up  . Type II diabetes mellitus     "diet controlled now" (08/03/2014)  . Arthritis      "just age related" (08/03/2014)  . Coronary artery disease     severe CAD with mid RCA occlusion, distal LAD stenosis, moderate disease of the left circ. Repeat cath 07/2014 for worsening LVF showed severe disease of the mid LAD, mild to mod LCX disease, occluded PL of the RCA with left to right collaterals to distal RCA territory s/p PCI of the LAD with DES.  It was noted that if patient develops CP resistant to medical therapy could consider PCI of RCA  . ESRD on peritoneal dialysis 09/2013    "followed by [name]; [facility]" (08/03/2014)  . Carotid artery occlusion     s/p left CEA with 40-59% restenosis and >80% stenosis of the Right ICA - followed by Dr. Kellie Simmering  . Chronic combined systolic and diastolic CHF (congestive heart failure)     Past Surgical History  Procedure Laterality Date  . Cholecystectomy  1993  . Shoulder open rotator cuff repair Right 07/06/2011  . Carotid endarterectomy Left   . Colonoscopy w/ biopsies and polypectomy      Hx: of  . Microlaryngoscopy with laser N/A 01/04/2013    Procedure: MICROLARYNGOSCOPY WITH BIOPSY/LASER;  Surgeon: [name], MD;  Location: [facility];  Service: ENT;  Laterality: N/A;  . Av fistula placement Left 02/23/2013    Procedure: ARTERIOVENOUS (AV) FISTULA CREATION;  Surgeon: Conrad Milton, MD;  Location: [place] [facility];  Service: Vascular;  Laterality: Left;  . Capd insertion N/A 08/01/2013    Procedure: LAPAROSCOPIC INSERTION CONTINUOUS AMBULATORY PERITONEAL DIALYSIS CATHETER;  Surgeon: [name], MD;  Location: [place] [facility];  Service: General;  Laterality: N/A;  . Laparoscopic abdominal exploration N/A 08/01/2013    Procedure: LAPAROSCOPIC ABDOMINAL EXPLORATION;  Surgeon: [name], MD;  Location: [facility];  Service: General;  Laterality: N/A;  . Ulnar nerve transposition Left 02/22/2014    Procedure: LEFT ULNAR NERVE RELEASE AND OR TRANSPOSITION;  Surgeon: [name], MD;  Location: [facility];  Service: Orthopedics;  Laterality: Left;  .  Left heart catheterization with coronary angiogram N/A 08/14/2013    Procedure: LEFT HEART CATHETERIZATION WITH CORONARY ANGIOGRAM;  Surgeon: [name], MD;  Location: [facility] CATH LAB;  Service: Cardiovascular;  Laterality: N/A;  . Coronary angioplasty  08/03/2014  . Cardiac catheterization  07/2013  . Tracheostomy  01/2013  . Tracheostomy closure  02/2013  . Left and right heart catheterization with coronary angiogram N/A 08/03/2014    Procedure: LEFT AND RIGHT HEART CATHETERIZATION WITH CORONARY ANGIOGRAM;  Surgeon: [name], MD;  Location: [facility] CATH LAB;  Service: Cardiovascular;  Laterality: N/A;  . Carotid stent insertion N/A 08/21/2014    Procedure: CAROTID STENT INSERTION;  Surgeon: [name], MD;  Location: [facility] CATH LAB;  Service: Cardiovascular;  Laterality: N/A;     Current Outpatient Prescriptions  Medication Sig Dispense Refill  . ALPRAZolam (XANAX) 0.5 MG tablet Take  0.5 mg by mouth at bedtime.   2  . amLODipine (NORVASC) 5 MG tablet Take 1 tablet (5 mg total) by mouth daily. 180 tablet 3  . aspirin EC 81 MG tablet Take 81 mg by mouth daily.    . calcium acetate (PHOSLO) 667 MG capsule Take 1,334 mg by mouth See admin instructions. Take 2 capsules (1334 mg) with meals and 1 capsule (667 mg) with snacks    . esomeprazole (NEXIUM) 20 MG capsule Take 20 mg by mouth daily before breakfast.     . glipiZIDE (GLUCOTROL XL) 2.5 MG 24 hr tablet Take 2.5 mg by mouth daily as needed (CBG over 140).     . Heparin Sod, Porcine, in D5W (HEPARIN, PORCINE, IN D5W IV) Inject 5 mLs into the vein daily as needed (with dialysis).     Marland Kitchen ipratropium (ATROVENT) 0.06 % nasal spray Place 2 sprays into both nostrils 2 (two) times daily.     Marland Kitchen losartan (COZAAR) 25 MG tablet Take 1 tablet (25 mg total) by mouth daily. 90 tablet 3  . metoprolol tartrate (LOPRESSOR) 25 MG tablet Take 1 tablet (25 mg total) by mouth 2 (two) times daily. 60 tablet 3  . multivitamin (RENA-VIT) TABS tablet Take 1  tablet by mouth daily.    . rosuvastatin (CRESTOR) 5 MG tablet Take 1 tablet by mouth as directed up to once daily (Patient taking differently: Take 5 mg by mouth 3 (three) times a week. Monday, Wednesday, Friday at noon) 90 tablet 3  . ticagrelor (BRILINTA) 90 MG TABS tablet Take 1 tablet (90 mg total) by mouth 2 (two) times daily. 60 tablet 10  . triamcinolone cream (KENALOG) 0.1 % Apply 1 application topically 2 (two) times daily as needed (rash).     . zolpidem (AMBIEN) 5 MG tablet Take 5 mg by mouth at bedtime as needed for sleep.     Marland Kitchen acetaminophen (TYLENOL) 500 MG tablet Take 1,000 mg by mouth every 6 (six) hours as needed for mild pain or headache.     . mupirocin ointment (BACTROBAN) 2 % Apply 1 application topically 2 (two) times daily as needed (rash).     . nitroGLYCERIN (NITROSTAT) 0.4 MG SL tablet Place 1 tablet (0.4 mg total) under the tongue every 5 (five) minutes as needed for chest pain. (Patient not taking: Reported on 09/10/2014) 25 tablet 2  . oxyCODONE (OXY IR/ROXICODONE) 5 MG immediate release tablet Take 1-2 tablets (5-10 mg total) by mouth every 4 (four) hours as needed for moderate pain. (Patient not taking: Reported on 09/05/2014) 20 tablet 0  . traMADol (ULTRAM) 50 MG tablet Take 100 mg by mouth every 4 (four) hours as needed (pain).     No current facility-administered medications for this visit.    Allergies:   Amoxicillin; Fentanyl; Moxifloxacin; and Nsaids    Social History:  The patient  reports that he quit smoking about 19 years ago. His smoking use included Cigarettes. He has a 62 pack-year smoking history. He has never used smokeless tobacco. He reports that he does not drink alcohol or use illicit drugs.   Family History:  The patient's He was adopted. Family history is unknown by patient.    ROS:   Please see the history of present illness.   Review of Systems  Constitution: Negative for fever.  Respiratory: Negative for cough.   Gastrointestinal:  Negative for diarrhea, hematochezia and melena.  All other systems reviewed and are negative.    PHYSICAL EXAM: VS:  BP 138/60 mmHg  Pulse 71  Ht 5\' 10"  (1.778 m)  Wt 165 lb (74.844 kg)  BMI 23.68 kg/m2    Wt Readings from Last 3 Encounters:  09/10/14 165 lb (74.844 kg)  09/05/14 171 lb (77.565 kg)  08/22/14 174 lb 9.7 oz (79.2 kg)     GEN: Well nourished, well developed, in no acute distress HEENT: normal Neck: no JVD, no masses Cardiac:  Normal S1/S2, RRR; no murmur ,  no rubs or gallops, no edema  Respiratory:  clear to auscultation bilaterally, no wheezing, rhonchi or rales. GI: soft, nontender, nondistended, + BS MS: no deformity or atrophy Skin: warm and dry  Neuro:  CNs II-XII intact, Strength and sensation are intact Psych: Normal affect   EKG:  EKG is ordered today.  It demonstrates:   NSR, HR 71, nonspecific ST-T wave changes, no change from prior tracing   Recent Labs: 07/30/2014: Pro B Natriuretic peptide (BNP) 1252.0*; TSH 2.29 08/22/2014: BUN 58*; Creatinine 10.45*; Hemoglobin 8.7*; Platelets 181; Potassium 5.0; Sodium 138    Lipid Panel    Component Value Date/Time   TRIG 434* 02/15/2013 0500      ASSESSMENT AND PLAN:  Essential hypertension Over the last several days, his blood pressures look better. Adjust medications as outlined below.  Ischemic cardiomyopathy He was unable to tolerate Coreg. Continue current dose of metoprolol. I will try to adjust his losartan to 25 mg twice a day. If his blood pressure runs too low with this, I have given him instructions to stop his amlodipine. Plan is to repeat his echocardiogram next month. If his ejection fraction remains <35%, he will likely need referral to EP for consideration of AICD.  Chronic systolic CHF (congestive heart failure) Volume management per dialysis.  Coronary artery disease involving native coronary artery of native heart without angina pectoris No angina. Continue aspirin, Brilinta,  statin, beta blocker. Refer to cardiac rehabilitation.  Hyperlipidemia He is followed in the lipid clinic.  Paroxysmal atrial flutter He had a singular episode. He has not been placed on anticoagulation due to frailty.  ESRD on dialysis  He is now on peritoneal dialysis.   Current medicines are reviewed at length with the patient today.  The patient has concerns regarding medicines.  The following changes have been made:  As above.   Labs/ tests ordered today include:   Orders Placed This Encounter  Procedures  . EKG 12-Lead    Disposition:   FU with Dr. Fransico Him May 2016 as planned.    Signed, Versie Starks, MHS 09/10/2014 11:33 AM    [facility] [place], [place], [place]  [address] Phone: [phone]; Fax: [phone]

## 2014-09-10 ENCOUNTER — Encounter: Payer: Self-pay | Admitting: Physician Assistant

## 2014-09-10 ENCOUNTER — Ambulatory Visit (INDEPENDENT_AMBULATORY_CARE_PROVIDER_SITE_OTHER): Payer: Medicare Other | Admitting: Physician Assistant

## 2014-09-10 VITALS — BP 138/60 | HR 71 | Ht 70.0 in | Wt 165.0 lb

## 2014-09-10 DIAGNOSIS — I255 Ischemic cardiomyopathy: Secondary | ICD-10-CM | POA: Diagnosis not present

## 2014-09-10 DIAGNOSIS — Z992 Dependence on renal dialysis: Secondary | ICD-10-CM

## 2014-09-10 DIAGNOSIS — E785 Hyperlipidemia, unspecified: Secondary | ICD-10-CM

## 2014-09-10 DIAGNOSIS — I2510 Atherosclerotic heart disease of native coronary artery without angina pectoris: Secondary | ICD-10-CM | POA: Diagnosis not present

## 2014-09-10 DIAGNOSIS — N186 End stage renal disease: Secondary | ICD-10-CM

## 2014-09-10 DIAGNOSIS — I10 Essential (primary) hypertension: Secondary | ICD-10-CM

## 2014-09-10 DIAGNOSIS — I5022 Chronic systolic (congestive) heart failure: Secondary | ICD-10-CM

## 2014-09-10 DIAGNOSIS — I4892 Unspecified atrial flutter: Secondary | ICD-10-CM

## 2014-09-10 MED ORDER — LOSARTAN POTASSIUM 25 MG PO TABS: 25.0000 mg | ORAL_TABLET | Freq: Two times a day (BID) | ORAL | Status: DC

## 2014-09-10 NOTE — Patient Instructions (Signed)
Your physician has recommended you make the following change in your medication:  1. INCREASE Losartan to 25mg  take one by mouth twice a day  Your physician has requested that you regularly monitor and record your blood pressure readings at home. Please use the same machine at the same time of day to check your readings and record them to bring to your follow-up visit. If your systolic BP (top number) is 100-110 and you feel bad please STOP Amlodipine If your systolic BP is less than 502 please call the office, even if you feel okay  You have been referred to [place].   Your physician recommends that you schedule a follow-up appointment in: May with Dr Radford Pax

## 2014-10-02 ENCOUNTER — Telehealth (HOSPITAL_COMMUNITY): Payer: Self-pay | Admitting: Cardiac Rehabilitation

## 2014-10-02 NOTE — Telephone Encounter (Signed)
-----   Message from [name], MD sent at 10/02/2014 10:26 AM EDT ----- Regarding: RE: cardiac rehab no ----- Message -----    From: Lowell Guitar, RN    Sent: 10/02/2014  10:20 AM      To: [name], MD Subject: cardiac rehab                                  Dear [name],  Pt is s/p right carotid stent 08/21/2014.  He is scheduled to begin cardiac rehab next week.  Are there any activity restrictions following his carotid stent?  Thank you, Andi Hence, RN, BSN Cardiac Pulmonary Rehab

## 2014-10-04 ENCOUNTER — Encounter (HOSPITAL_COMMUNITY)
Admission: RE | Admit: 2014-10-04 | Discharge: 2014-10-04 | Disposition: A | Discharge status: Home / Self Care | Payer: Medicare Other | Source: Ambulatory Visit | Attending: Cardiology | Admitting: Cardiology

## 2014-10-04 DIAGNOSIS — Z79899 Other long term (current) drug therapy: Secondary | ICD-10-CM | POA: Insufficient documentation

## 2014-10-04 DIAGNOSIS — Z955 Presence of coronary angioplasty implant and graft: Secondary | ICD-10-CM | POA: Insufficient documentation

## 2014-10-04 DIAGNOSIS — Z992 Dependence on renal dialysis: Secondary | ICD-10-CM | POA: Insufficient documentation

## 2014-10-04 DIAGNOSIS — Z48812 Encounter for surgical aftercare following surgery on the circulatory system: Secondary | ICD-10-CM | POA: Insufficient documentation

## 2014-10-04 DIAGNOSIS — I509 Heart failure, unspecified: Secondary | ICD-10-CM | POA: Insufficient documentation

## 2014-10-04 NOTE — Progress Notes (Signed)
Cardiac Rehab Medication Review by a Pharmacist  Does the patient  feel that his/her medications are working for him/her?  yes  Has the patient been experiencing any side effects to the medications prescribed?  no  Does the patient measure his/her own blood pressure or blood glucose at home?  yes  Does the patient have any problems obtaining medications due to transportation or finances?   no  Understanding of regimen: excellent Understanding of indications: excellent Potential of compliance: excellent   Pharmacist comments: Victor Lane is a pleasant 69 yo male who presents to cardiac rehab today for review of his medications. He has no complaints overall and feels like his medications are working well. Patient has excellent recall of his medication names and dosages. He does measure his BP at home and states that sBP has been running a little high up to the 150s, with HR in the high 90s. Advised patient to follow up with his cardiologist next month as his BB or ARB could be titrated up. Patient is also following up in lipid clinic tomorrow - he is currently tolerating Crestor 5mg  TIW with no complaints.  Megan E. Supple, Pharm.D Clinical Pharmacy Resident Pager: [phone] 10/04/2014 8:57 AM

## 2014-10-05 ENCOUNTER — Ambulatory Visit: Payer: Medicare Other | Admitting: Pharmacist

## 2014-10-08 ENCOUNTER — Telehealth: Payer: Self-pay | Admitting: Cardiology

## 2014-10-08 ENCOUNTER — Ambulatory Visit (HOSPITAL_COMMUNITY): Payer: Medicare Other | Attending: Cardiovascular Disease | Admitting: Cardiology

## 2014-10-08 DIAGNOSIS — E119 Type 2 diabetes mellitus without complications: Secondary | ICD-10-CM | POA: Diagnosis not present

## 2014-10-08 DIAGNOSIS — I509 Heart failure, unspecified: Secondary | ICD-10-CM | POA: Insufficient documentation

## 2014-10-08 DIAGNOSIS — I10 Essential (primary) hypertension: Secondary | ICD-10-CM | POA: Diagnosis not present

## 2014-10-08 DIAGNOSIS — E785 Hyperlipidemia, unspecified: Secondary | ICD-10-CM | POA: Diagnosis not present

## 2014-10-08 DIAGNOSIS — I429 Cardiomyopathy, unspecified: Secondary | ICD-10-CM | POA: Insufficient documentation

## 2014-10-08 DIAGNOSIS — I255 Ischemic cardiomyopathy: Secondary | ICD-10-CM | POA: Diagnosis not present

## 2014-10-08 DIAGNOSIS — I2510 Atherosclerotic heart disease of native coronary artery without angina pectoris: Secondary | ICD-10-CM | POA: Diagnosis not present

## 2014-10-08 DIAGNOSIS — I313 Pericardial effusion (noninflammatory): Secondary | ICD-10-CM | POA: Insufficient documentation

## 2014-10-08 NOTE — Telephone Encounter (Signed)
New message      Pt had labs drawn at [facility] last Monday.  He want Dr Radford Pax to call and get the results because she may want to see them

## 2014-10-08 NOTE — Progress Notes (Signed)
Limited echo performed. 

## 2014-10-09 ENCOUNTER — Telehealth: Payer: Self-pay | Admitting: Cardiology

## 2014-10-09 NOTE — Telephone Encounter (Signed)
Called Dr. Delman Kitten office and requested lab work sent to [phone].  Notified patient that records have been requested and he will be contacted when Dr. Radford Pax reviews them.

## 2014-10-09 NOTE — Telephone Encounter (Signed)
Please let patient know that echo showed moderate LV dysfunction EF 30-35%.  Please refer to EP for evaluation for AICD

## 2014-10-09 NOTE — Telephone Encounter (Signed)
Informed patient of results and verbal understanding expressed.  Patient refuses referral for EP and AICD at this time.  He st he has had two stents placed in the last month and is starting cardiac rehab tomorrow. He st all this has been exhausting and he does not think an AICD is what he needs at this point.  After a detailed conversation about AICD, patient adamantly refuses and says he will call if he changes his mind.

## 2014-10-10 ENCOUNTER — Encounter (HOSPITAL_COMMUNITY)
Admission: RE | Admit: 2014-10-10 | Discharge: 2014-10-10 | Disposition: A | Discharge status: Home / Self Care | Payer: Medicare Other | Source: Ambulatory Visit | Attending: Cardiology | Admitting: Cardiology

## 2014-10-10 ENCOUNTER — Encounter: Payer: Self-pay | Admitting: Cardiology

## 2014-10-10 DIAGNOSIS — Z992 Dependence on renal dialysis: Secondary | ICD-10-CM | POA: Diagnosis not present

## 2014-10-10 DIAGNOSIS — Z955 Presence of coronary angioplasty implant and graft: Secondary | ICD-10-CM | POA: Diagnosis not present

## 2014-10-10 DIAGNOSIS — I509 Heart failure, unspecified: Secondary | ICD-10-CM | POA: Diagnosis not present

## 2014-10-10 DIAGNOSIS — Z48812 Encounter for surgical aftercare following surgery on the circulatory system: Secondary | ICD-10-CM | POA: Diagnosis present

## 2014-10-10 DIAGNOSIS — Z79899 Other long term (current) drug therapy: Secondary | ICD-10-CM | POA: Diagnosis not present

## 2014-10-10 LAB — GLUCOSE, CAPILLARY: Glucose-Capillary: 128 mg/dL — ABNORMAL HIGH (ref 70–99)

## 2014-10-10 NOTE — Progress Notes (Addendum)
Patient appears short of breath this morning. Oxygen saturation 99% on room air. Upon assessment lung fields with scattered crackles right base. [name] called and notified spoke with [name]. Patient was instructed to use a stronger dialysis solution at home for peritoneal dialysis. Victor Lane reported that his hemoglobin has been low last CBC in epic shows a hemoglobin of 8.7. Patient advised not to exercise today. Patient has a follow up appointment with his nephrologist tomorrow.  Patient will started exercise when he feels better and pending his CBC result.  Blood pressure 124/70 heart rate 88. CBG 128. Will fax today's ECG tracing and today's vital signs to the [facility] for review. PHQ=1. Victor Lane said he felt a little depressed last week when he found out his hemoglobin was low. Patient denies feeling depressed today. Patient has his wife as support.

## 2014-10-12 ENCOUNTER — Encounter: Payer: Self-pay | Admitting: Cardiology

## 2014-10-12 ENCOUNTER — Encounter (HOSPITAL_COMMUNITY): Payer: Medicare Other

## 2014-10-12 ENCOUNTER — Telehealth (HOSPITAL_COMMUNITY): Payer: Self-pay | Admitting: *Deleted

## 2014-10-12 NOTE — Telephone Encounter (Signed)
Called and spoke to pt regarding his appt on Thursday and general well being.  Pt indicated on 10/11/14 that he felt some better with the new solution which pulled "alot" of fluid off.  Still with some shortness of breath but improved. Eager to start exercise.  Advised pt we would need to wait for the result of his lab work and md clearance to begin cardiac rehab.  Advised pt that rehab would contact him on Friday.  Called on 4/22 and spoke to his wife.  Pt was laying down taking a nap.  Lab results will not be available until Monday. Advised wife we will continue hold the start of rehab.  Called and spoke to tessie at the home care therapy for [facility] care.  Advised to continue to hold the start of exercise until labs were back and reviewed by [name].  Contact phone and fax number provided.  Requested office note for continuity of care. Cherre Huger, BSN

## 2014-10-15 ENCOUNTER — Encounter (HOSPITAL_COMMUNITY): Payer: Medicare Other

## 2014-10-17 ENCOUNTER — Encounter (HOSPITAL_COMMUNITY)
Admission: RE | Admit: 2014-10-17 | Discharge: 2014-10-17 | Disposition: A | Discharge status: Home / Self Care | Payer: Medicare Other | Source: Ambulatory Visit | Attending: Cardiology | Admitting: Cardiology

## 2014-10-17 DIAGNOSIS — Z48812 Encounter for surgical aftercare following surgery on the circulatory system: Secondary | ICD-10-CM | POA: Diagnosis not present

## 2014-10-17 LAB — GLUCOSE, CAPILLARY: Glucose-Capillary: 110 mg/dL — ABNORMAL HIGH (ref 70–99)

## 2014-10-17 NOTE — Progress Notes (Signed)
Patient is here for exercise at cardiac rehab upon assessment lung fields with crackles in the left bases diminished breath sounds on the right. Oxygen saturation 96% on room air. Patient reports feeling short of breath with exertion. The dialysis center called and notified. Patient was instructed by Langley Gauss nurse at the kidney to use a stronger dialysis solution at home she spoke with the patients wife over the phone. Patient will not exercise today. Patient is going to the kidney center tomorrow for an iron infusion. Patient hopes to start exercise on Monday.

## 2014-10-19 ENCOUNTER — Encounter (HOSPITAL_COMMUNITY): Payer: Medicare Other

## 2014-10-19 ENCOUNTER — Ambulatory Visit
Admission: RE | Admit: 2014-10-19 | Discharge: 2014-10-19 | Disposition: A | Discharge status: Home / Self Care | Payer: Medicare Other | Source: Ambulatory Visit | Attending: Otolaryngology | Admitting: Otolaryngology

## 2014-10-19 ENCOUNTER — Other Ambulatory Visit: Payer: Self-pay | Admitting: Otolaryngology

## 2014-10-19 DIAGNOSIS — D099 Carcinoma in situ, unspecified: Secondary | ICD-10-CM

## 2014-10-21 ENCOUNTER — Encounter: Payer: Self-pay | Admitting: Physician Assistant

## 2014-10-22 ENCOUNTER — Telehealth (HOSPITAL_COMMUNITY): Payer: Self-pay | Admitting: *Deleted

## 2014-10-22 ENCOUNTER — Telehealth: Payer: Self-pay

## 2014-10-22 ENCOUNTER — Encounter (HOSPITAL_COMMUNITY)
Admission: RE | Admit: 2014-10-22 | Discharge: 2014-10-22 | Disposition: A | Discharge status: Home / Self Care | Payer: Medicare Other | Source: Ambulatory Visit | Attending: Cardiology | Admitting: Cardiology

## 2014-10-22 DIAGNOSIS — Z955 Presence of coronary angioplasty implant and graft: Secondary | ICD-10-CM | POA: Diagnosis not present

## 2014-10-22 DIAGNOSIS — I509 Heart failure, unspecified: Secondary | ICD-10-CM | POA: Insufficient documentation

## 2014-10-22 DIAGNOSIS — Z992 Dependence on renal dialysis: Secondary | ICD-10-CM | POA: Diagnosis not present

## 2014-10-22 DIAGNOSIS — Z79899 Other long term (current) drug therapy: Secondary | ICD-10-CM | POA: Diagnosis not present

## 2014-10-22 DIAGNOSIS — Z48812 Encounter for surgical aftercare following surgery on the circulatory system: Secondary | ICD-10-CM | POA: Insufficient documentation

## 2014-10-22 LAB — GLUCOSE, CAPILLARY
Glucose-Capillary: 101 mg/dL — ABNORMAL HIGH (ref 70–99)
Glucose-Capillary: 120 mg/dL — ABNORMAL HIGH (ref 70–99)

## 2014-10-22 MED ORDER — LISINOPRIL 5 MG PO TABS: 5.0000 mg | ORAL_TABLET | Freq: Every day | ORAL | Status: DC

## 2014-10-22 NOTE — Progress Notes (Signed)
Pt able to return to rehab for his first day of cardiac rehab phase II 11:15 exercise class. Pt tolerated light exercise with some complaints of fatigue and leg weakness.  Pt readily admits that he has done very little exercise since his hospitalization.  Pt complains that he has been unable to exercise due to profound weakness.  Advised pt on chair exercises he can do at home to help with his overall conditioning. Psychosocial Assessment -Short and long term goal is to increase strength and stamina. Pt desires to get back to those activities he enjoys doing such as yard work.  Will continue to check back in with pt to assess his progress toward this goal.  PHQ2 score 0.  Although patient is dealing with complex medical issues including chronic kidney disease and undergoes Peritoneal dialysis he feels very optimistic about his future and has the support of his wife. Cherre Huger, BSN

## 2014-10-22 NOTE — Telephone Encounter (Signed)
Received message from [place] today with many patient concerns.  Patient stated he felt "left in the dark" about testing and that he is only called when there is instruction to give him. Apologized to patient if he felt neglected, stating that I try to give as much information to each patient as I can. Reminded patient of our last conversation. Last time we spoke he was given results and instruction and he deferred Dr. Theodosia Blender recommendations for EP referral for AICD.  Patient st he remembers that conversation well and he "wasn't talking about that time."  Patient then became defensive, stating that he did not want to see the EP doctor because he is working hard in Cardiac Rehab to gain strength and does not need an AICD at this point. Reassured patient that he is in total control and we are not going to do anything he does not want. Patient apologized and then st he "started off on the wrong foot" earlier in the year when he called regarding a medication and it took 3 days to get an answer.  Reviewed phone notes, and relayed to patient that the longest waiting period was one day. Reiterated to patient that Dr. Radford Pax is not always in the office, and there may be some lag time but responses are done ASAP. Informed patient that if a patient calls, I call them back that day if I am in the office, even if it is to say Dr. Radford Pax is not here and I'll call with instruction tomorrow. Apologized to patient if this was unsatisfactory. Patient also concerned because MyChart was resulting tests (BSFS) on days when he was not here. Informed patient that those results are from when he was at Cardiac Rehab and to call them if there are concerns about incorrect results. Also explained to patient that we do not have control over MyChart and the manner in which results are displayed. That if he wants records we can give him paper copies.  Patient grateful for informative call and st he will call or message if he needs  anything. He st he has no further questions at this time.

## 2014-10-22 NOTE — Telephone Encounter (Signed)
Returned call from pt message left on answering machine.  Pt wife reports that pt feels better and is no longer "gurgling in the throat".  Pt used hypertonic solution on his last PD exchange. Pt feels okay to proceed with rehab today. Cherre Huger, BSN

## 2014-10-24 ENCOUNTER — Encounter (HOSPITAL_COMMUNITY)
Admission: RE | Admit: 2014-10-24 | Discharge: 2014-10-24 | Disposition: A | Discharge status: Home / Self Care | Payer: Medicare Other | Source: Ambulatory Visit | Attending: Cardiology | Admitting: Cardiology

## 2014-10-24 DIAGNOSIS — Z48812 Encounter for surgical aftercare following surgery on the circulatory system: Secondary | ICD-10-CM | POA: Diagnosis not present

## 2014-10-24 LAB — GLUCOSE, CAPILLARY
Glucose-Capillary: 107 mg/dL — ABNORMAL HIGH (ref 70–99)
Glucose-Capillary: 101 mg/dL — ABNORMAL HIGH (ref 70–99)

## 2014-10-24 NOTE — Progress Notes (Signed)
Quality of Life Survey Results - Pt completed quality of life survey as a participant in Cardiac Rehab. Pt with overall sore of 24.54, health and function - 22.37, socioeconomic 27.94, Physical and spiritual 22.29 and family 28.80.  Pt with scores above set goal of 21.0,  No needs identified.  Continue to monitor with periodic check in. Cherre Huger, BSN

## 2014-10-26 ENCOUNTER — Encounter (HOSPITAL_COMMUNITY): Admission: RE | Admit: 2014-10-26 | Payer: Medicare Other | Source: Ambulatory Visit

## 2014-10-26 ENCOUNTER — Telehealth (HOSPITAL_COMMUNITY): Payer: Self-pay | Admitting: *Deleted

## 2014-10-29 ENCOUNTER — Encounter (HOSPITAL_COMMUNITY): Payer: Medicare Other

## 2014-10-29 ENCOUNTER — Telehealth (HOSPITAL_COMMUNITY): Payer: Self-pay | Admitting: Family Medicine

## 2014-10-31 ENCOUNTER — Encounter (HOSPITAL_COMMUNITY)
Admission: RE | Admit: 2014-10-31 | Discharge: 2014-10-31 | Disposition: A | Discharge status: Home / Self Care | Payer: Medicare Other | Source: Ambulatory Visit | Attending: Cardiology | Admitting: Cardiology

## 2014-10-31 DIAGNOSIS — Z48812 Encounter for surgical aftercare following surgery on the circulatory system: Secondary | ICD-10-CM | POA: Diagnosis not present

## 2014-10-31 LAB — GLUCOSE, CAPILLARY: GLUCOSE-CAPILLARY: 108 mg/dL — AB (ref 70–99)

## 2014-10-31 NOTE — Progress Notes (Signed)
Pt returned to rehab today. Pt out on Friday and Monday due to "sour" stomach.  Pt weight today elevated at 80.2.  Pt denies any shortness of breath, lungs clear bilat. Called and spoke to [place] with home training at the dialysis clinic.  Pt seen on yesterday and was given instructions to use 4.25%, 4.25% and 2% due to weight gain.  Pt ate out for mothers day.  He had a burger and fries. Pt used only one 4.25%, and two 2%.  Jolayne Haines will call and speak to wife and pt regarding the correct amount to use tonight.  Alerted pt and wife to expect a call regarding treatment tonight. Cherre Huger, BSN

## 2014-10-31 NOTE — Progress Notes (Signed)
Academic intern reviewed home exercise with pt today.  Pt plans to walk and use floor pedaler at home for exercise.  Reviewed THR, pulse, RPE, sign and symptoms, NTG use, and when to call 911 or MD.  Pt voiced understanding. Shirlyn Goltz, Academic Intern Alberteen Sam, MA, ACSM RCEP

## 2014-11-02 ENCOUNTER — Encounter (HOSPITAL_COMMUNITY)
Admission: RE | Admit: 2014-11-02 | Discharge: 2014-11-02 | Disposition: A | Discharge status: Home / Self Care | Payer: Medicare Other | Source: Ambulatory Visit | Attending: Cardiology | Admitting: Cardiology

## 2014-11-02 DIAGNOSIS — Z48812 Encounter for surgical aftercare following surgery on the circulatory system: Secondary | ICD-10-CM | POA: Diagnosis not present

## 2014-11-05 ENCOUNTER — Encounter (HOSPITAL_COMMUNITY)
Admission: RE | Admit: 2014-11-05 | Discharge: 2014-11-05 | Disposition: A | Discharge status: Home / Self Care | Payer: Medicare Other | Source: Ambulatory Visit | Attending: Cardiology | Admitting: Cardiology

## 2014-11-05 ENCOUNTER — Ambulatory Visit: Payer: Medicare Other | Admitting: Cardiology

## 2014-11-05 DIAGNOSIS — Z48812 Encounter for surgical aftercare following surgery on the circulatory system: Secondary | ICD-10-CM | POA: Diagnosis not present

## 2014-11-07 ENCOUNTER — Encounter: Payer: Self-pay | Admitting: Physician Assistant

## 2014-11-07 ENCOUNTER — Encounter (HOSPITAL_COMMUNITY): Payer: Medicare Other

## 2014-11-09 ENCOUNTER — Encounter (HOSPITAL_COMMUNITY): Admission: RE | Admit: 2014-11-09 | Payer: Medicare Other | Source: Ambulatory Visit

## 2014-11-09 ENCOUNTER — Telehealth (HOSPITAL_COMMUNITY): Payer: Self-pay | Admitting: *Deleted

## 2014-11-12 ENCOUNTER — Encounter (HOSPITAL_COMMUNITY)
Admission: RE | Admit: 2014-11-12 | Discharge: 2014-11-12 | Disposition: A | Discharge status: Home / Self Care | Payer: Medicare Other | Source: Ambulatory Visit | Attending: Cardiology | Admitting: Cardiology

## 2014-11-12 DIAGNOSIS — Z48812 Encounter for surgical aftercare following surgery on the circulatory system: Secondary | ICD-10-CM | POA: Diagnosis not present

## 2014-11-14 ENCOUNTER — Encounter (HOSPITAL_COMMUNITY)
Admission: RE | Admit: 2014-11-14 | Discharge: 2014-11-14 | Disposition: A | Discharge status: Home / Self Care | Payer: Medicare Other | Source: Ambulatory Visit | Attending: Cardiology | Admitting: Cardiology

## 2014-11-14 DIAGNOSIS — Z48812 Encounter for surgical aftercare following surgery on the circulatory system: Secondary | ICD-10-CM | POA: Diagnosis not present

## 2014-11-14 NOTE — Progress Notes (Signed)
Pt noted during the first station at the 11:15 cardiac rehab phase II class stretching his calf muscles.  Pt stated that he was having some cramping to his legs and to his hands at night.  Questioned pt regarding his dialysis fluid he is using.  Pt uses one bag on 4% and the remaining bags are his usually dose.  Called and spoke to [place] [facility] training [facility].  Hillary spoke to both patient and his wife regarding overall fluid intake, how much he is actually pulling off and his salt intake.  Hillary will follow up with pt regarding resolution of symptoms. Cherre Huger, BSN

## 2014-11-16 ENCOUNTER — Encounter (HOSPITAL_COMMUNITY): Admission: RE | Admit: 2014-11-16 | Payer: Medicare Other | Source: Ambulatory Visit

## 2014-11-20 ENCOUNTER — Telehealth (HOSPITAL_COMMUNITY): Payer: Self-pay | Admitting: *Deleted

## 2014-11-21 ENCOUNTER — Encounter (HOSPITAL_COMMUNITY): Payer: Medicare Other

## 2014-11-22 ENCOUNTER — Telehealth (HOSPITAL_COMMUNITY): Payer: Self-pay | Admitting: *Deleted

## 2014-11-22 ENCOUNTER — Other Ambulatory Visit: Payer: Self-pay | Admitting: Physician Assistant

## 2014-11-22 DIAGNOSIS — R109 Unspecified abdominal pain: Secondary | ICD-10-CM

## 2014-11-22 NOTE — Telephone Encounter (Signed)
Pt called and left message on rehab answering machine.  Return call to pt.  Pt unsure of what the plan will be regarding " possible hernia verses pulled muscle" Pt seen in the office by MD on yesterday.  Pt will call and let rehab know what develops.  Advised pt that he would be placed on medical hold from exercise. Cherre Huger, BSN

## 2014-11-23 ENCOUNTER — Encounter (HOSPITAL_COMMUNITY): Admission: RE | Admit: 2014-11-23 | Payer: Medicare Other | Source: Ambulatory Visit

## 2014-11-26 ENCOUNTER — Encounter (HOSPITAL_COMMUNITY): Payer: Medicare Other

## 2014-11-26 ENCOUNTER — Ambulatory Visit
Admission: RE | Admit: 2014-11-26 | Discharge: 2014-11-26 | Disposition: A | Discharge status: Home / Self Care | Payer: Medicare Other | Source: Ambulatory Visit | Attending: Physician Assistant | Admitting: Physician Assistant

## 2014-11-26 DIAGNOSIS — R109 Unspecified abdominal pain: Secondary | ICD-10-CM

## 2014-11-28 ENCOUNTER — Telehealth (HOSPITAL_COMMUNITY): Payer: Self-pay | Admitting: *Deleted

## 2014-11-28 ENCOUNTER — Encounter (HOSPITAL_COMMUNITY): Payer: Medicare Other

## 2014-11-28 NOTE — Telephone Encounter (Signed)
Pt called to update rehab staff about his medical conditions.  Things seemed to be improving and all the test have come back "negative".  Pt plans to return to exercise on Monday June 20th - this is based upon his current progress of improving.  Pt states his belly discomfort is continues to improve. Cherre Huger, BSN

## 2014-11-30 ENCOUNTER — Encounter (HOSPITAL_COMMUNITY): Admission: RE | Admit: 2014-11-30 | Payer: Medicare Other | Source: Ambulatory Visit

## 2014-12-03 ENCOUNTER — Encounter (HOSPITAL_COMMUNITY): Payer: Medicare Other

## 2014-12-04 ENCOUNTER — Encounter (HOSPITAL_COMMUNITY): Payer: Self-pay | Admitting: *Deleted

## 2014-12-05 ENCOUNTER — Encounter (HOSPITAL_COMMUNITY): Payer: Medicare Other

## 2014-12-07 ENCOUNTER — Encounter (HOSPITAL_COMMUNITY): Payer: Medicare Other

## 2014-12-10 ENCOUNTER — Encounter (HOSPITAL_COMMUNITY)
Admission: RE | Admit: 2014-12-10 | Discharge: 2014-12-10 | Disposition: A | Discharge status: Home / Self Care | Payer: Medicare Other | Source: Ambulatory Visit | Attending: Cardiology | Admitting: Cardiology

## 2014-12-10 DIAGNOSIS — Z955 Presence of coronary angioplasty implant and graft: Secondary | ICD-10-CM | POA: Insufficient documentation

## 2014-12-10 DIAGNOSIS — I509 Heart failure, unspecified: Secondary | ICD-10-CM | POA: Diagnosis not present

## 2014-12-10 DIAGNOSIS — Z992 Dependence on renal dialysis: Secondary | ICD-10-CM | POA: Diagnosis not present

## 2014-12-10 DIAGNOSIS — Z79899 Other long term (current) drug therapy: Secondary | ICD-10-CM | POA: Diagnosis not present

## 2014-12-10 DIAGNOSIS — Z48812 Encounter for surgical aftercare following surgery on the circulatory system: Secondary | ICD-10-CM | POA: Insufficient documentation

## 2014-12-10 NOTE — Progress Notes (Signed)
30 day Psychosocial Assessment Pt returned to cardiac rehab after absence due to pulled muscle in the abdominal area. Pt tolerated light exercise with no complaints.  Pt appeared happy to be back to rehab. Wife admits pt was not very active during his time away from rehab despite encouragement.  Continue to encourage pt to be active at home and to start exercising. Pt weight elevated today.  Pt reports that his dry weight has increased by Dr. Wilford Sports.  Pt no longer has the cramps he use to have. No further psychosocial needs identified. Cherre Huger, BSN

## 2014-12-12 ENCOUNTER — Telehealth (HOSPITAL_COMMUNITY): Payer: Self-pay | Admitting: *Deleted

## 2014-12-12 ENCOUNTER — Encounter (HOSPITAL_COMMUNITY): Payer: Medicare Other

## 2014-12-12 NOTE — Telephone Encounter (Signed)
Pt called to let rehab know that his abdominal muscle pain had returned. Pt in for exercise on Monday and felt fine.  However on Tuesday morning when he awoke he felt the twinge had returned.  Talked with pt regarding whether to return to rehab extensively.  Mutually decided that since the break helped before lets try that again.  Will look at exercise equipment choices for appropriate equipment that would not aggravate the muscle strain. Cherre Huger, BSN

## 2014-12-14 ENCOUNTER — Encounter (HOSPITAL_COMMUNITY): Payer: Medicare Other

## 2014-12-15 ENCOUNTER — Other Ambulatory Visit: Payer: Self-pay | Admitting: Nurse Practitioner

## 2014-12-17 ENCOUNTER — Encounter (HOSPITAL_COMMUNITY): Payer: Medicare Other

## 2014-12-17 ENCOUNTER — Other Ambulatory Visit: Payer: Self-pay

## 2014-12-18 NOTE — Telephone Encounter (Signed)
I do not have in any recent notes that he is on Lisinopril - please check with the patient

## 2014-12-19 ENCOUNTER — Encounter (HOSPITAL_COMMUNITY): Payer: Medicare Other

## 2014-12-21 ENCOUNTER — Encounter (HOSPITAL_COMMUNITY): Payer: Medicare Other

## 2014-12-26 ENCOUNTER — Encounter (HOSPITAL_COMMUNITY): Payer: Medicare Other

## 2014-12-28 ENCOUNTER — Encounter (HOSPITAL_COMMUNITY): Payer: Medicare Other

## 2014-12-31 ENCOUNTER — Encounter (HOSPITAL_COMMUNITY): Payer: Medicare Other

## 2015-01-02 ENCOUNTER — Telehealth (HOSPITAL_COMMUNITY): Payer: Self-pay | Admitting: *Deleted

## 2015-01-02 ENCOUNTER — Encounter (HOSPITAL_COMMUNITY): Admission: RE | Admit: 2015-01-02 | Payer: Medicare Other | Source: Ambulatory Visit

## 2015-01-02 NOTE — Telephone Encounter (Signed)
Returned call from message left by patient.  Pt will hold on returning to exercise until he is seen on next week in the dialysis clinic.  Pt abdominal muscle issues have resolved but pt is having difficulty getting his peritoneal fluid coming back out.  Pt has changed the strength of the dialysis fluid with no additional fluid removed.  Pt will see his nephrologist on next Tuesday.  Rehab staff requested call back for update. Cherre Huger, BSN

## 2015-01-04 ENCOUNTER — Encounter (HOSPITAL_COMMUNITY): Payer: Medicare Other

## 2015-01-07 ENCOUNTER — Encounter (HOSPITAL_COMMUNITY): Payer: Medicare Other

## 2015-01-09 ENCOUNTER — Telehealth (HOSPITAL_COMMUNITY): Payer: Self-pay | Admitting: *Deleted

## 2015-01-09 ENCOUNTER — Encounter (HOSPITAL_COMMUNITY): Payer: Medicare Other

## 2015-01-09 NOTE — Telephone Encounter (Signed)
Pt left message for rehab staff earlier today.  Called back and spoke to pt.   Pt seen by nephrologist in clinic on yesterday.  Pt continues to have difficulties with his output and subsequent abdominal discomfort.  Pt given several options to try.  Pt plans to try these one at a time so he will be able to determine which strategy worked best for him.  Pt plans to call back next week for update.  Pt wanted to put exercise on hold for one month. Pt reminded that per medicare guidelines for CR his last possible date to attend is 02/08/2015 which is 18 weeks from his first day of attendance.  Cherre Huger, BSN

## 2015-01-11 ENCOUNTER — Encounter (HOSPITAL_COMMUNITY): Payer: Medicare Other

## 2015-01-14 ENCOUNTER — Encounter (HOSPITAL_COMMUNITY): Payer: Medicare Other

## 2015-01-15 ENCOUNTER — Ambulatory Visit: Payer: Medicare Other | Admitting: Cardiology

## 2015-01-16 ENCOUNTER — Encounter (HOSPITAL_COMMUNITY): Payer: Medicare Other

## 2015-01-17 ENCOUNTER — Telehealth (HOSPITAL_COMMUNITY): Payer: Self-pay | Admitting: *Deleted

## 2015-01-17 NOTE — Telephone Encounter (Signed)
Pt called rehab to inform staff that he would no longer be able to participate in cardiac rehab.  Pt received news that his kidneys were not clearing all of the toxins out of his blood.  Pt received injection for low Hemoglobin and will increase dialysis to five cycles a session.  Based on how pt feels he will not be able to return to exercise.  Will mail him a postage paid envelope to use to replace parking pass.  Cherre Huger, BSN

## 2015-01-18 ENCOUNTER — Encounter (HOSPITAL_COMMUNITY): Admission: RE | Admit: 2015-01-18 | Payer: Medicare Other | Source: Ambulatory Visit

## 2015-01-21 ENCOUNTER — Encounter (HOSPITAL_COMMUNITY): Payer: Medicare Other

## 2015-01-21 NOTE — Progress Notes (Signed)
Pain Status: generalized pain.  Takes tramadol bid. BP 128/54 mmHg  Pulse 87  Temp(Src) 98 F (36.7 C) (Oral)  Resp 16  Ht 5' 9.25" (1.759 m)  Wt 176 lb 6.4 oz (80.015 kg)  BMI 25.86 kg/m2  SpO2 96%  Nutritional Status a) intake: can eat what he wants.  Has trouble chewing certain foods like steak. b) using a feeding tube?: removed c) weight changes, if any:   Wt Readings from Last 3 Encounters:  01/25/15 176 lb 6.4 oz (80.015 kg)  10/04/14 170 lb 3.1 oz (77.2 kg)  09/10/14 165 lb (74.844 kg)    Swallowing Status: no trouble with swallowing  Smoking or chewing tobacco? no  Dental (if applicable): When was last visit with dentistry 6 months ago  Using fluoride trays daily? no   When was last ENT visit? 6 months ago  When is next ENT visit? October 2016 with Dr. Janace Hoard.  Summary of last medical oncology visit (if applicable) is n/a.   Next med/onc visit is on n/a.  Imaging done in the last month (if applicable) revealed: n/a  Other notable issues, if any: Peritoneal dialysis every night.  Weight is stable.  Reports feeling run down from a torn muscle in his abdomen.  He was on bedrest off and on.  He is starting to feel better now.

## 2015-01-23 ENCOUNTER — Encounter (HOSPITAL_COMMUNITY): Payer: Medicare Other

## 2015-01-24 NOTE — Telephone Encounter (Signed)
Victor Lane started patient on medication in May.

## 2015-01-25 ENCOUNTER — Telehealth: Payer: Self-pay | Admitting: Oncology

## 2015-01-25 ENCOUNTER — Ambulatory Visit
Admission: RE | Admit: 2015-01-25 | Discharge: 2015-01-25 | Disposition: A | Discharge status: Home / Self Care | Payer: Medicare Other | Source: Ambulatory Visit | Attending: Radiation Oncology | Admitting: Radiation Oncology

## 2015-01-25 ENCOUNTER — Ambulatory Visit (HOSPITAL_BASED_OUTPATIENT_CLINIC_OR_DEPARTMENT_OTHER)
Admission: RE | Admit: 2015-01-25 | Discharge: 2015-01-25 | Disposition: A | Discharge status: Home / Self Care | Payer: Medicare Other | Source: Ambulatory Visit | Attending: Radiation Oncology | Admitting: Radiation Oncology

## 2015-01-25 ENCOUNTER — Encounter: Payer: Self-pay | Admitting: Radiation Oncology

## 2015-01-25 ENCOUNTER — Encounter (HOSPITAL_COMMUNITY): Payer: Medicare Other

## 2015-01-25 VITALS — BP 128/54 | HR 87 | Temp 98.0°F | Resp 16 | Ht 69.25 in | Wt 176.4 lb

## 2015-01-25 DIAGNOSIS — R635 Abnormal weight gain: Secondary | ICD-10-CM

## 2015-01-25 DIAGNOSIS — C320 Malignant neoplasm of glottis: Secondary | ICD-10-CM | POA: Insufficient documentation

## 2015-01-25 LAB — TSH CHCC: TSH: 0.869 m(IU)/L (ref 0.320–4.118)

## 2015-01-25 MED ORDER — LARYNGOSCOPY SOLUTION RAD-ONC
15.0000 mL | Freq: Once | TOPICAL | Status: AC
  Administered 2015-01-25: 15 mL via TOPICAL
  Filled 2015-01-25: qty 15

## 2015-01-25 NOTE — Progress Notes (Signed)
.  Radiation Oncology         [phone]) [phone] ________________________________  Name: Victor Lane MRN: [id number]  Date: 01/25/2015  DOB: [birth year]  Follow-Up Visit Note  Oupatient  REFERRING: [name] MD  CC: [name], MD  [name], MD       ICD-9-CM ICD-10-CM   1. 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 He is feeling alright today. He has not noticed any new lumps or bumps. His pain is unchanged. He is not currently on a thyroid supplement.  Swallowing Status: no trouble with swallowing   Speech: no persistent hoarseness, except when throat is dry.  Smoking or chewing tobacco? no   Dental (if applicable): When was last visit with dentistry 6 months ago Using fluoride trays daily? no   When was last ENT visit? 6 months ago When is next ENT visit? October 2016 with Dr. Janace Hoard.   Summary of last medical oncology visit (if applicable) is n/a. Next med/onc visit is on n/a.   Imaging done in the last month (if applicable) revealed: n/a   Other notable issues, if any: Peritoneal dialysis every night. Weight is stable. Reports feeling run down from a torn muscle in his abdomen. He was on bedrest off and on.  He is starting to feel better now.                                ALLERGIES:  is allergic to amoxicillin; fentanyl; moxifloxacin; and nsaids.  Meds: Current Outpatient Prescriptions  Medication Sig Dispense Refill  . acetaminophen (TYLENOL) 500 MG tablet Take 1,000 mg by mouth every 6 (six) hours as needed for mild pain or headache.     Marland Kitchen aspirin EC 81 MG tablet Take 81 mg by mouth daily.    . calcium acetate (PHOSLO) 667 MG  capsule Take 1,334 mg by mouth See admin instructions. Take 2 capsules (1334 mg) with meals and 1 capsule (667 mg) with snacks    . esomeprazole (NEXIUM) 20 MG capsule Take 20 mg by mouth daily before breakfast.     . gentamicin cream (GARAMYCIN) 0.1 % APPLY TO SITE DAILY  11  . ipratropium (ATROVENT) 0.06 % nasal spray Place 2 sprays into both nostrils 2 (two) times daily.     Marland Kitchen lisinopril (PRINIVIL,ZESTRIL) 5 MG tablet TAKE 1 TABLET (5 MG TOTAL) BY MOUTH DAILY. 30 tablet 6  . metoprolol tartrate (LOPRESSOR) 25 MG tablet Take 1 tablet (25 mg total) by mouth 2 (two) times daily. (Patient taking differently: Take 50 mg by mouth 2 (two) times daily. ) 60 tablet 3  . multivitamin (RENA-VIT) TABS tablet Take 1 tablet by mouth daily.    . rosuvastatin (CRESTOR) 5 MG tablet Take 1 tablet by mouth as directed up to once daily (Patient taking differently: Take 5 mg by mouth 3 (three) times a week. Monday, Wednesday, Friday at noon) 90 tablet 3  . ticagrelor (BRILINTA) 90 MG TABS tablet Take 1 tablet (90 mg total) by mouth 2 (two) times daily. 60 tablet 10  . traMADol (ULTRAM) 50 MG tablet Take 100 mg by mouth every 4 (four)  hours as needed (pain).    . triamcinolone cream (KENALOG) 0.1 % Apply 1 application topically 2 (two) times daily as needed (rash).     . ALPRAZolam (XANAX) 0.5 MG tablet Take 0.5 mg by mouth at bedtime.   2  . amLODipine (NORVASC) 5 MG tablet Take 1 tablet (5 mg total) by mouth daily. (Patient not taking: Reported on 01/25/2015) 180 tablet 3  . mupirocin ointment (BACTROBAN) 2 % Apply 1 application topically 2 (two) times daily as needed (rash).     . nitroGLYCERIN (NITROSTAT) 0.4 MG SL tablet Place 1 tablet (0.4 mg total) under the tongue every 5 (five) minutes as needed for chest pain. (Patient not taking: Reported on 01/25/2015) 25 tablet 2   No current facility-administered medications for this encounter.    Physical Findings: The patient is in no acute distress. Patient is alert  and oriented.  height is 5' 9.25" (1.759 m) and weight is 176 lb 6.4 oz (80.015 kg). His oral temperature is 98 F (36.7 C). His blood pressure is 128/54 and his pulse is 87. His respiration is 16 and oxygen saturation is 96%. .    Oral cavity and oropharynx show no lesions. No palpable masses in his cervical or supraclavicular regions present. Telangiectasia on his low neck skin consistent with radiation changes.  PROCEDURE NOTE: After anesthetizing the nasal cavity with topical lidocaine and phenylephrine, the flexible endoscope was introduced and passed through the nasal cavity. Pharynx appears normal. Epiglottis appears normal. False cords appear normal. True vocal cords are without lesions and are symmetrically mobile.   Lab Findings: Lab Results  Component Value Date   WBC 7.0 08/22/2014   HGB 8.7* 08/22/2014   HCT 27.6* 08/22/2014   MCV 104.9* 08/22/2014   PLT 181 08/22/2014   Lab Results  Component Value Date   TSH 0.869 01/25/2015    Radiographic Findings: No results found.  Impression/Plan: NED. He is scheduled to see Dr. Janace Hoard in October 2016. We will follow up in April 2016.    This document serves as a record of services personally performed by Eppie Gibson, MD. It was created on her behalf by Arlyce Harman, a trained medical scribe. The creation of this record is based on the scribe's personal observations and the provider's statements to them. This document has been checked and approved by the attending provider. _____________________________________   Eppie Gibson, MD

## 2015-01-25 NOTE — Addendum Note (Signed)
Encounter addended by: Norm Salt, RN on: 01/25/2015 12:49 PM<BR>     Documentation filed: Notes Section

## 2015-01-25 NOTE — Telephone Encounter (Signed)
Called [facility] and spoke to his wife, Fraser Din.  Advised her that Jahon's TSH was normal per Dr. Isidore Moos.  Pat verbalized understanding and will inform [facility] of the results.

## 2015-01-25 NOTE — Progress Notes (Signed)
Called patient to inform tsh lab results are normal.No answer.

## 2015-01-28 ENCOUNTER — Encounter (HOSPITAL_COMMUNITY): Payer: Medicare Other

## 2015-01-30 ENCOUNTER — Encounter (HOSPITAL_COMMUNITY): Payer: Medicare Other

## 2015-02-01 ENCOUNTER — Encounter (HOSPITAL_COMMUNITY): Payer: Medicare Other

## 2015-02-04 ENCOUNTER — Encounter (HOSPITAL_COMMUNITY): Payer: Medicare Other

## 2015-02-06 ENCOUNTER — Encounter (HOSPITAL_COMMUNITY): Payer: Medicare Other

## 2015-02-08 ENCOUNTER — Encounter (HOSPITAL_COMMUNITY): Payer: Medicare Other

## 2015-02-24 IMAGING — CR DG CHEST 2V
2 series · 2 of 2 positions shown · non-contrast
Comparison: PA and lateral chest of March 31, 2014

CLINICAL DATA: Preoperative exam prior to surgery

EXAM:
CHEST  2 VIEW

[w chest pa]
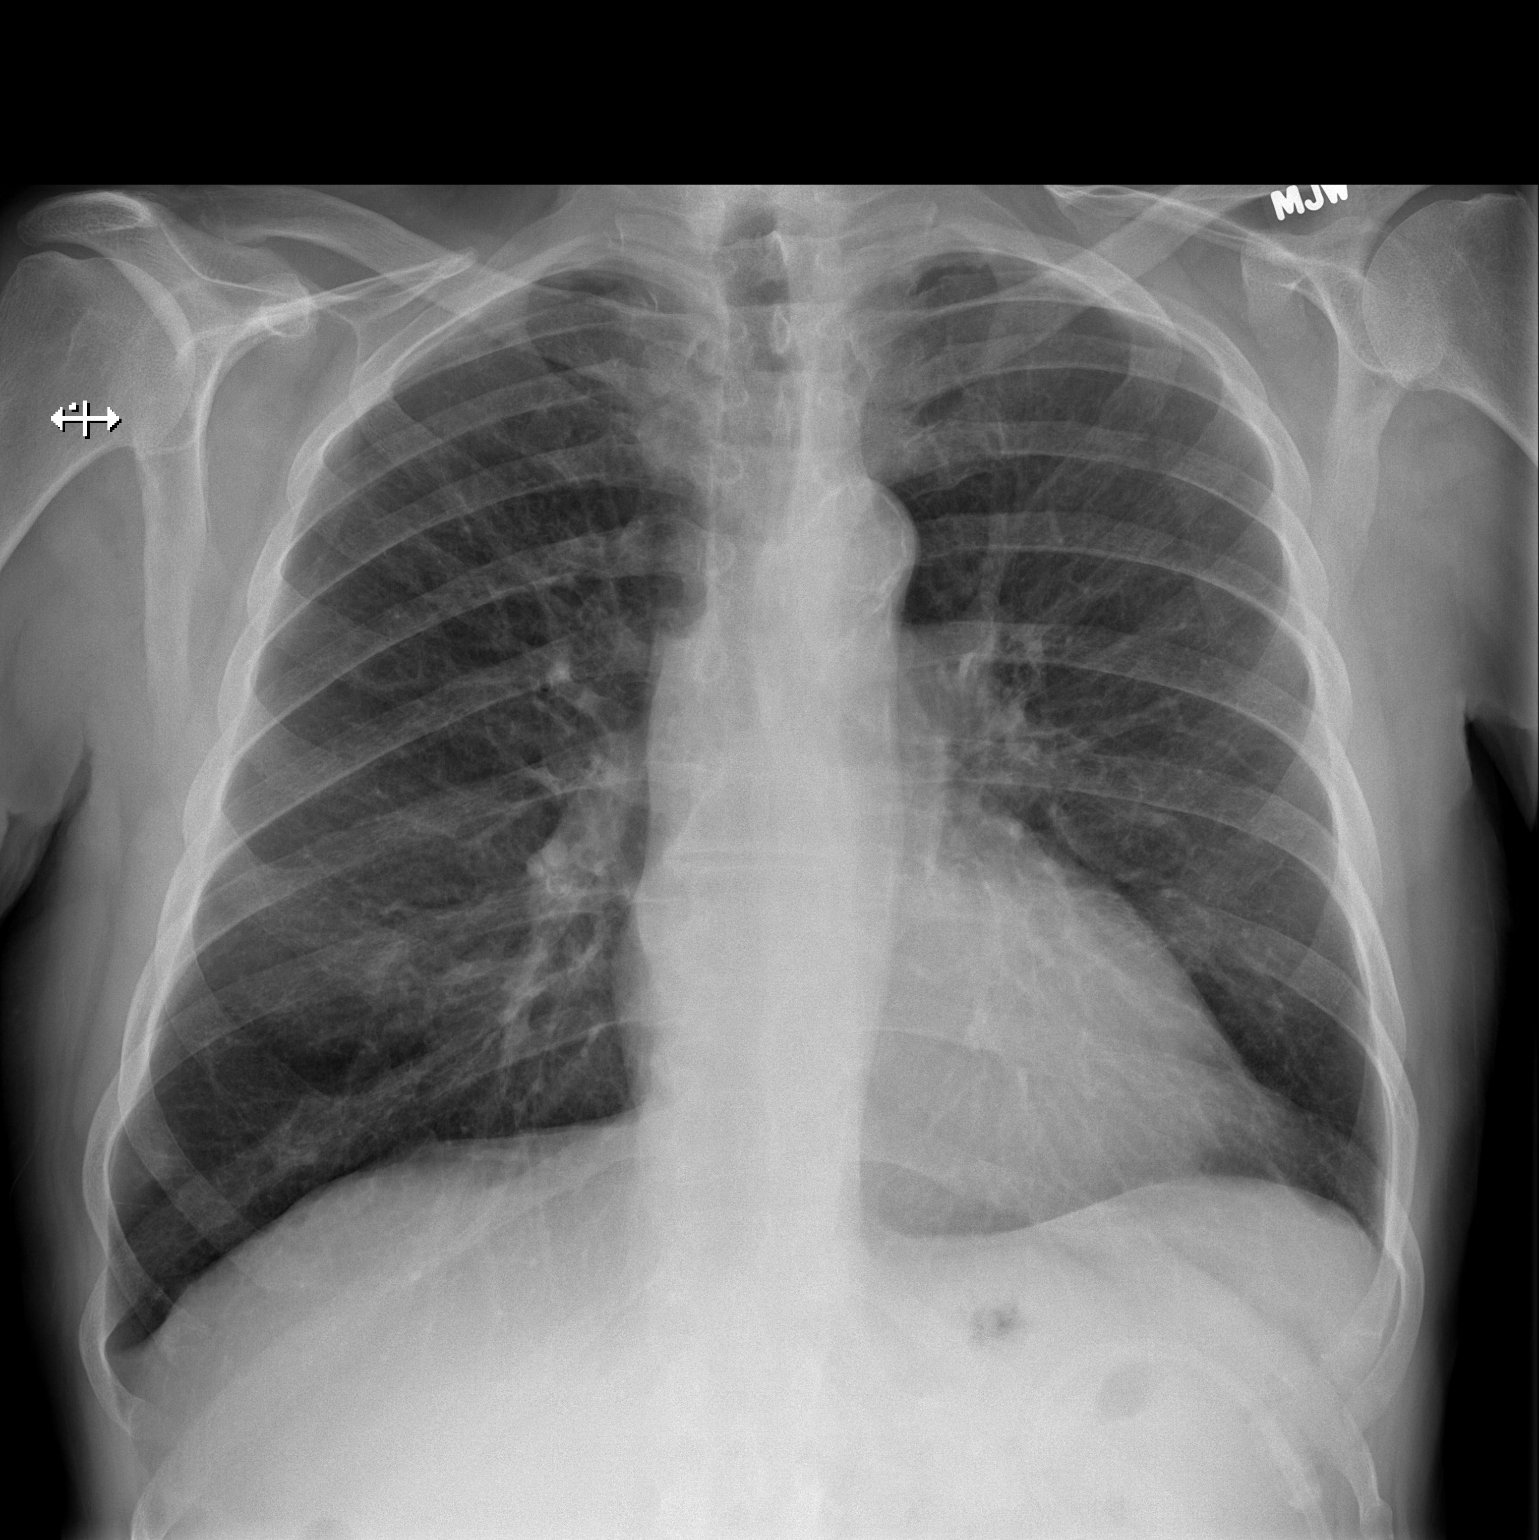

[w chest lat]
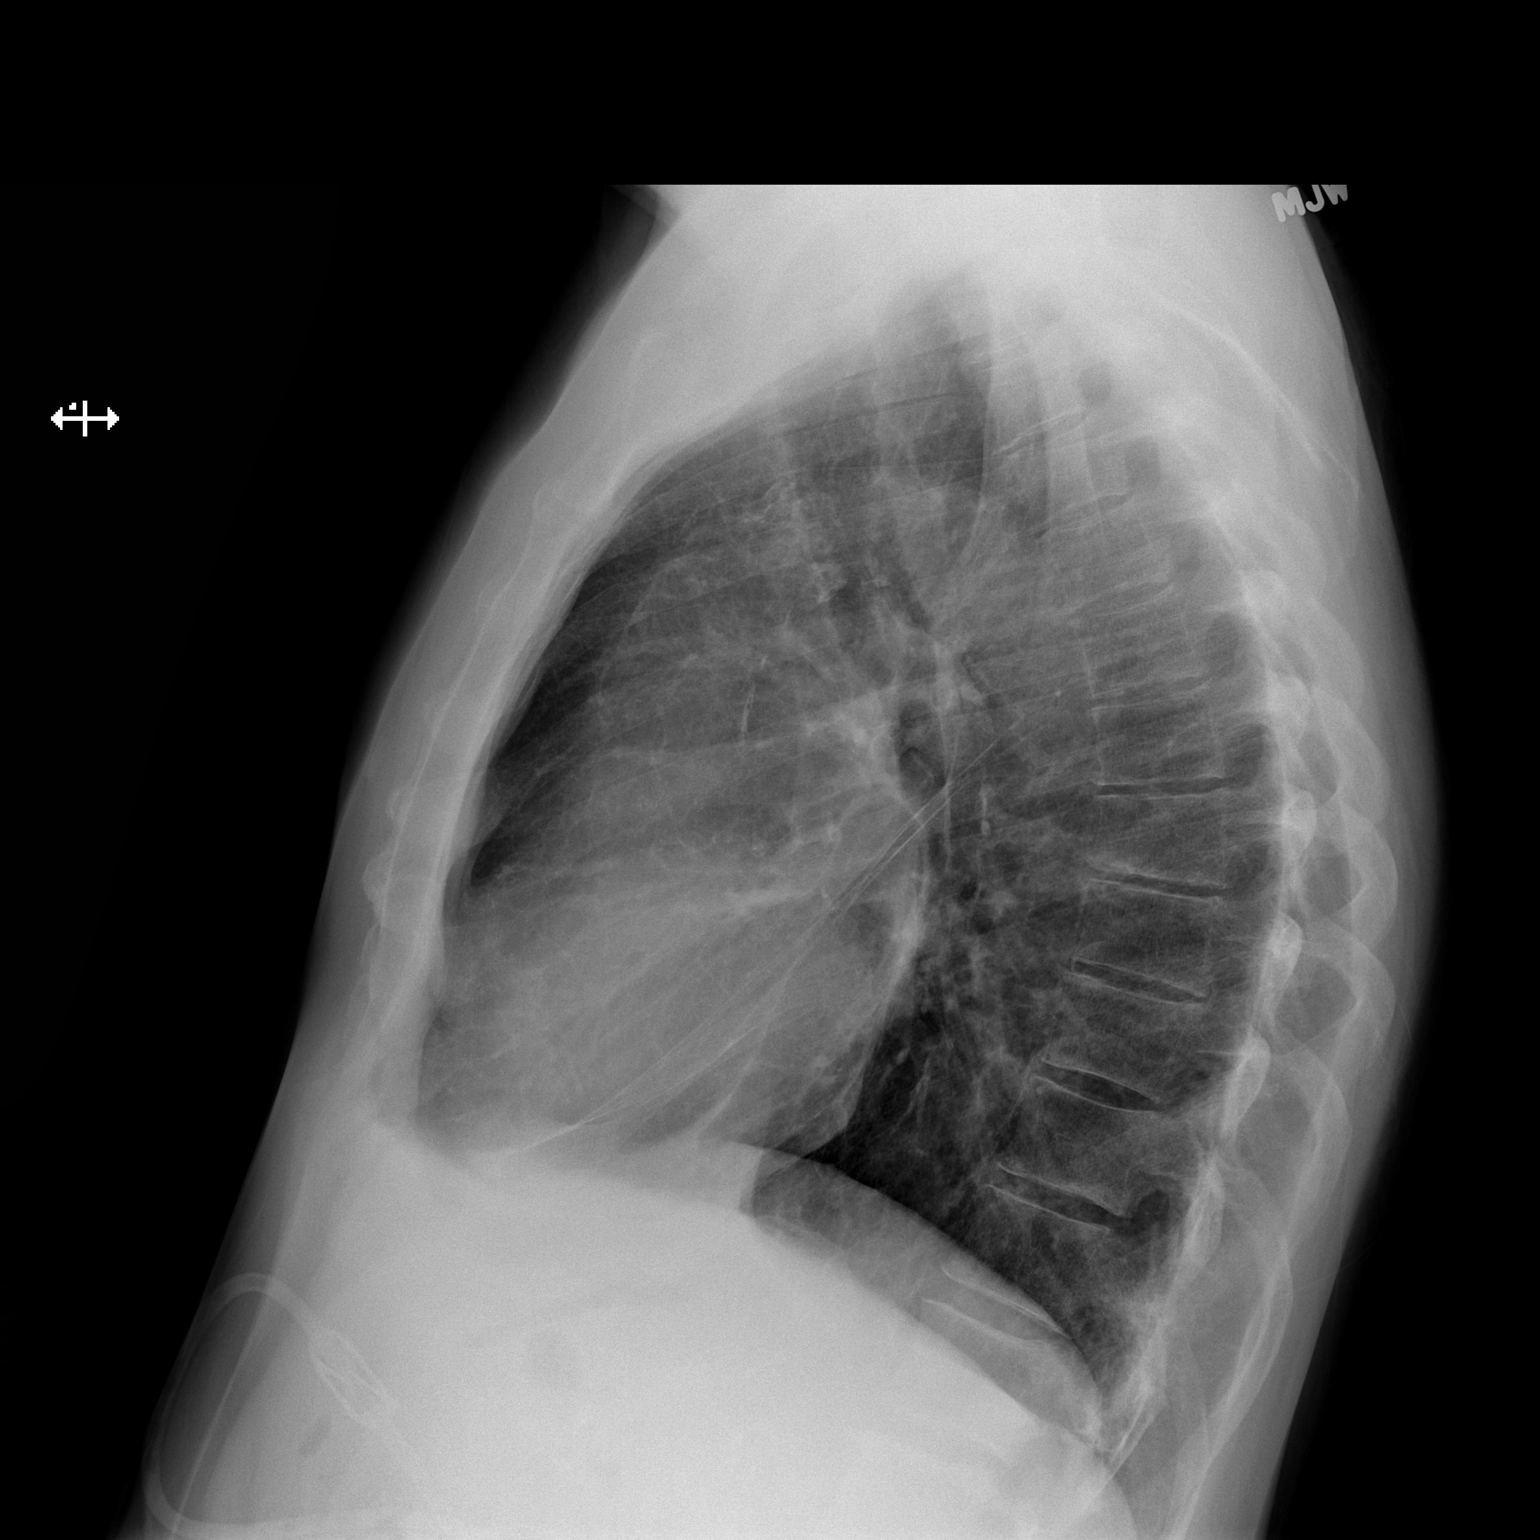

[2 of 2 positions shown; findings below may reference images not displayed]

FINDINGS: The lungs are adequately inflated. The pulmonary vascularity is
normal today. The interstitial markings are mildly prominent but
much improved over the previous study. The cardiac silhouette is
normal in size. There is no pleural effusion. The bony thorax is
unremarkable.
IMPRESSION: There is no evidence of acute cardiopulmonary abnormality.
Previously demonstrated interstitial edema likely secondary to CHF
has cleared. Minimal bilateral pulmonary interstitial prominence
persists which is likely the patient's baseline.

## 2015-02-26 ENCOUNTER — Other Ambulatory Visit: Payer: Self-pay | Admitting: *Deleted

## 2015-02-26 DIAGNOSIS — I6523 Occlusion and stenosis of bilateral carotid arteries: Secondary | ICD-10-CM

## 2015-02-26 DIAGNOSIS — Z9889 Other specified postprocedural states: Secondary | ICD-10-CM

## 2015-03-12 ENCOUNTER — Encounter: Payer: Self-pay | Admitting: Family

## 2015-03-14 ENCOUNTER — Ambulatory Visit (HOSPITAL_COMMUNITY)
Admission: RE | Admit: 2015-03-14 | Discharge: 2015-03-14 | Disposition: A | Discharge status: Home / Self Care | Payer: Medicare Other | Source: Ambulatory Visit | Attending: Family | Admitting: Family

## 2015-03-14 ENCOUNTER — Encounter: Payer: Self-pay | Admitting: Family

## 2015-03-14 ENCOUNTER — Ambulatory Visit (INDEPENDENT_AMBULATORY_CARE_PROVIDER_SITE_OTHER): Payer: Medicare Other | Admitting: Family

## 2015-03-14 VITALS — BP 185/74 | HR 76 | Temp 97.7°F | Resp 16 | Ht 70.0 in | Wt 185.0 lb

## 2015-03-14 DIAGNOSIS — Z959 Presence of cardiac and vascular implant and graft, unspecified: Secondary | ICD-10-CM

## 2015-03-14 DIAGNOSIS — Z9889 Other specified postprocedural states: Secondary | ICD-10-CM | POA: Diagnosis not present

## 2015-03-14 DIAGNOSIS — I6523 Occlusion and stenosis of bilateral carotid arteries: Secondary | ICD-10-CM | POA: Diagnosis present

## 2015-03-14 DIAGNOSIS — E785 Hyperlipidemia, unspecified: Secondary | ICD-10-CM | POA: Diagnosis not present

## 2015-03-14 DIAGNOSIS — Z87891 Personal history of nicotine dependence: Secondary | ICD-10-CM | POA: Diagnosis not present

## 2015-03-14 DIAGNOSIS — N186 End stage renal disease: Secondary | ICD-10-CM | POA: Diagnosis not present

## 2015-03-14 DIAGNOSIS — I255 Ischemic cardiomyopathy: Secondary | ICD-10-CM | POA: Diagnosis not present

## 2015-03-14 DIAGNOSIS — E119 Type 2 diabetes mellitus without complications: Secondary | ICD-10-CM | POA: Diagnosis not present

## 2015-03-14 DIAGNOSIS — Z992 Dependence on renal dialysis: Secondary | ICD-10-CM

## 2015-03-14 DIAGNOSIS — I10 Essential (primary) hypertension: Secondary | ICD-10-CM | POA: Diagnosis not present

## 2015-03-14 NOTE — Patient Instructions (Signed)
Stroke Prevention Some medical conditions and behaviors are associated with an increased chance of having a stroke. You may prevent a stroke by making healthy choices and managing medical conditions. HOW CAN I REDUCE MY RISK OF HAVING A STROKE?   Stay physically active. Get at least 30 minutes of activity on most or all days.  Do not smoke. It may also be helpful to avoid exposure to secondhand smoke.  Limit alcohol use. Moderate alcohol use is considered to be:  No more than 2 drinks per day for men.  No more than 1 drink per day for nonpregnant women.  Eat healthy foods. This involves:  Eating 5 or more servings of fruits and vegetables a day.  Making dietary changes that address high blood pressure (hypertension), high cholesterol, diabetes, or obesity.  Manage your cholesterol levels.  Making food choices that are high in fiber and low in saturated fat, trans fat, and cholesterol may control cholesterol levels.  Take any prescribed medicines to control cholesterol as directed by your health care provider.  Manage your diabetes.  Controlling your carbohydrate and sugar intake is recommended to manage diabetes.  Take any prescribed medicines to control diabetes as directed by your health care provider.  Control your hypertension.  Making food choices that are low in salt (sodium), saturated fat, trans fat, and cholesterol is recommended to manage hypertension.  Take any prescribed medicines to control hypertension as directed by your health care provider.  Maintain a healthy weight.  Reducing calorie intake and making food choices that are low in sodium, saturated fat, trans fat, and cholesterol are recommended to manage weight.  Stop drug abuse.  Avoid taking birth control pills.  Talk to your health care provider about the risks of taking birth control pills if you are over 35 years old, smoke, get migraines, or have ever had a blood clot.  Get evaluated for sleep  disorders (sleep apnea).  Talk to your health care provider about getting a sleep evaluation if you snore a lot or have excessive sleepiness.  Take medicines only as directed by your health care provider.  For some people, aspirin or blood thinners (anticoagulants) are helpful in reducing the risk of forming abnormal blood clots that can lead to stroke. If you have the irregular heart rhythm of atrial fibrillation, you should be on a blood thinner unless there is a good reason you cannot take them.  Understand all your medicine instructions.  Make sure that other conditions (such as anemia or atherosclerosis) are addressed. SEEK IMMEDIATE MEDICAL CARE IF:   You have sudden weakness or numbness of the face, arm, or leg, especially on one side of the body.  Your face or eyelid droops to one side.  You have sudden confusion.  You have trouble speaking (aphasia) or understanding.  You have sudden trouble seeing in one or both eyes.  You have sudden trouble walking.  You have dizziness.  You have a loss of balance or coordination.  You have a sudden, severe headache with no known cause.  You have new chest pain or an irregular heartbeat. Any of these symptoms may represent a serious problem that is an emergency. Do not wait to see if the symptoms will go away. Get medical help at once. Call your local emergency services (911 in [place] Do not drive yourself to the hospital. Document Released: 07/16/2004 Document Revised: 10/23/2013 Document Reviewed: 12/09/2012 [employer] Patient Information 2015 [employer]. This information is not intended to replace advice given   to you by your health care provider. Make sure you discuss any questions you have with your health care provider.  

## 2015-03-14 NOTE — Progress Notes (Signed)
Established Carotid Patient   History of Present Illness  Victor Lane is a 69 y.o. male patient of [name] who is status post right angioplasty and stent on 08/21/2014, and left carotid endarterectomy on 04/01/2011.  He returns today for follow up.  He states his diabetes has been well controlled. He is currently on Brilinta and aspirin for antiplatelet therapy. His Brilinta was started after recent coronary procedure. The patient denies any history of TIA or stroke symptoms, specifically the patient denies a history of amaurosis fugax or monocular blindness, denies a history unilateral  of facial drooping, denies a history of hemiplegia, and denies a history of receptive or expressive aphasia.   Pt states he has some neuropathy in his lower legs, worse at night: tingling and cramping.   Wife states she controls pt's blood pressure by changing his PD dialysate, he will see Dr. [name] next week. He has been on PD since 2014. The patient denies New Medical or Surgical History.  Pt Diabetic: yes, wife states controlled by dialysate adjustments Pt smoker: former smoker, quit in 1997  Pt meds include: Statin : yes ASA: yes Other anticoagulants/antiplatelets: Brilinta   Past Medical History  Diagnosis Date  . Neuropathy   . H/O vitamin D deficiency   . Hearing loss   . Hypercholesteremia   . 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 S/P radiation therapy 01/24/2013-03/14/2013    Larynx/glottis / 63 Gy in 28 fractions  . Hypertension   . GERD (gastroesophageal reflux disease)   . Sleep apnea     to request from Dr.Fried was done about 48yrs ago but no cpap d/t weight loss  . Fibromyalgia     takes tramadol daily as needed  . Myalgia and myositis   . Peripheral neuropathy   . History of colon polyps   . Cataracts, bilateral   . Dilated aortic root   . Aortic valve sclerosis   . Diastolic dysfunction   . Aortic  regurgitation   . Complication of anesthesia     hard to wake up and then panics waking up  . Type II diabetes mellitus     "diet controlled now" (08/03/2014)  . Arthritis     "just age related" (08/03/2014)  . Coronary artery disease     severe CAD with mid RCA occlusion, distal LAD stenosis, moderate disease of the left circ. Repeat cath 07/2014 for worsening LVF showed severe disease of the mid LAD, mild to mod LCX disease, occluded PL of the RCA with left to right collaterals to distal RCA territory s/p PCI of the LAD with DES.  It was noted that if patient develops CP resistant to medical therapy could consider PCI of RCA  . ESRD on peritoneal dialysis 09/2013    "followed by [name]; [facility]" (08/03/2014)  . Carotid artery occlusion     s/p left CEA with 40-59% restenosis and >80% stenosis of the Right ICA - followed by Dr. Kellie Simmering  . Chronic combined systolic and diastolic CHF (congestive heart failure)     Social History Social History  Substance Use Topics  . Smoking status: Former Smoker -- 2.00 packs/day for 31 years    Types: Cigarettes    Quit date: 08/07/1995  . Smokeless tobacco: Never Used     Comment: quit smoking in 1997  . Alcohol Use: No    Family History Family History  Problem Relation  Age of Onset  . Adopted: Yes  . Family history unknown: Yes    Surgical History Past Surgical History  Procedure Laterality Date  . Cholecystectomy  1993  . Shoulder open rotator cuff repair Right 07/06/2011  . Carotid endarterectomy Left   . Colonoscopy w/ biopsies and polypectomy      Hx: of  . Microlaryngoscopy with laser N/A 01/04/2013    Procedure: MICROLARYNGOSCOPY WITH BIOPSY/LASER;  Surgeon: [name], MD;  Location: [place];  Service: ENT;  Laterality: N/A;  . Av fistula placement Left 02/23/2013    Procedure: ARTERIOVENOUS (AV) FISTULA CREATION;  Surgeon: Conrad Wailuku, MD;  Location: [place];  Service: Vascular;  Laterality: Left;  . Capd insertion N/A  08/01/2013    Procedure: LAPAROSCOPIC INSERTION CONTINUOUS AMBULATORY PERITONEAL DIALYSIS CATHETER;  Surgeon: [name], MD;  Location: [place];  Service: General;  Laterality: N/A;  . Laparoscopic abdominal exploration N/A 08/01/2013    Procedure: LAPAROSCOPIC ABDOMINAL EXPLORATION;  Surgeon: [name], MD;  Location: [place];  Service: General;  Laterality: N/A;  . Ulnar nerve transposition Left 02/22/2014    Procedure: LEFT ULNAR NERVE RELEASE AND OR TRANSPOSITION;  Surgeon: [name], MD;  Location: [place];  Service: Orthopedics;  Laterality: Left;  . Left heart catheterization with coronary angiogram N/A 08/14/2013    Procedure: LEFT HEART CATHETERIZATION WITH CORONARY ANGIOGRAM;  Surgeon: [name], MD;  Location: [facility] CATH LAB;  Service: Cardiovascular;  Laterality: N/A;  . Coronary angioplasty  08/03/2014  . Cardiac catheterization  07/2013  . Tracheostomy  01/2013  . Tracheostomy closure  02/2013  . Left and right heart catheterization with coronary angiogram N/A 08/03/2014    Procedure: LEFT AND RIGHT HEART CATHETERIZATION WITH CORONARY ANGIOGRAM;  Surgeon: [name], MD;  Location: [facility] CATH LAB;  Service: Cardiovascular;  Laterality: N/A;  . Carotid stent insertion N/A 08/21/2014    Procedure: CAROTID STENT INSERTION;  Surgeon: [name], MD;  Location[facility] CATH LAB;  Service: Cardiovascular;  Laterality: N/A;    Allergies  Allergen Reactions  . Amoxicillin Diarrhea and Nausea And Vomiting  . Fentanyl Other (See Comments)    Confusion when patch applied  . Moxifloxacin Diarrhea and Nausea And Vomiting    Avelox  . Nsaids Other (See Comments)    Severe kidney disease/ESRD    Current Outpatient Prescriptions  Medication Sig Dispense Refill  . acetaminophen (TYLENOL) 500 MG tablet Take 1,000 mg by mouth every 6 (six) hours as needed for mild pain or headache.     . ALPRAZolam (XANAX) 0.5 MG tablet Take 0.5 mg by mouth at bedtime.   2  . amLODipine  (NORVASC) 5 MG tablet Take 1 tablet (5 mg total) by mouth daily. (Patient not taking: Reported on 01/25/2015) 180 tablet 3  . aspirin EC 81 MG tablet Take 81 mg by mouth daily.    . calcium acetate (PHOSLO) 667 MG capsule Take 1,334 mg by mouth See admin instructions. Take 2 capsules (1334 mg) with meals and 1 capsule (667 mg) with snacks    . esomeprazole (NEXIUM) 20 MG capsule Take 20 mg by mouth daily before breakfast.     . gentamicin cream (GARAMYCIN) 0.1 % APPLY TO SITE DAILY  11  . ipratropium (ATROVENT) 0.06 % nasal spray Place 2 sprays into both nostrils 2 (two) times daily.     Marland Kitchen lisinopril (PRINIVIL,ZESTRIL) 5 MG tablet TAKE 1 TABLET (5 MG TOTAL) BY MOUTH DAILY. 30 tablet 6  .  metoprolol tartrate (LOPRESSOR) 25 MG tablet Take 1 tablet (25 mg total) by mouth 2 (two) times daily. (Patient taking differently: Take 50 mg by mouth 2 (two) times daily. ) 60 tablet 3  . multivitamin (RENA-VIT) TABS tablet Take 1 tablet by mouth daily.    . mupirocin ointment (BACTROBAN) 2 % Apply 1 application topically 2 (two) times daily as needed (rash).     . nitroGLYCERIN (NITROSTAT) 0.4 MG SL tablet Place 1 tablet (0.4 mg total) under the tongue every 5 (five) minutes as needed for chest pain. (Patient not taking: Reported on 01/25/2015) 25 tablet 2  . rosuvastatin (CRESTOR) 5 MG tablet Take 1 tablet by mouth as directed up to once daily (Patient taking differently: Take 5 mg by mouth 3 (three) times a week. Monday, Wednesday, Friday at noon) 90 tablet 3  . ticagrelor (BRILINTA) 90 MG TABS tablet Take 1 tablet (90 mg total) by mouth 2 (two) times daily. 60 tablet 10  . traMADol (ULTRAM) 50 MG tablet Take 100 mg by mouth every 4 (four) hours as needed (pain).    . triamcinolone cream (KENALOG) 0.1 % Apply 1 application topically 2 (two) times daily as needed (rash).      No current facility-administered medications for this visit.    Review of Systems : See HPI for pertinent positives and  negatives.  Physical Examination  Filed Vitals:   03/14/15 1535 03/14/15 1536  BP: 161/64 185/74  Pulse: 76 76  Temp: 97.7 F (36.5 C)   Resp: 16   Height: 5\' 10"  (1.778 m)   Weight: 185 lb (83.915 kg)   SpO2: 97%    Body mass index is 26.54 kg/(m^2).  General: WDWN male in NAD GAIT: normal Eyes: PERRLA Pulmonary:  Non-labored, CTAB, no rales, no rhonchi, & no wheezing.  Cardiac: regular rhythm,  no detected murmur.  VASCULAR EXAM Carotid Bruits Right Left   Negative Negative    Aorta is not palpable. Radial pulses are 2+ palpable and equal.                                                                                                                            LE Pulses Right Left       POPLITEAL  not palpable   not palpable       POSTERIOR TIBIAL  not palpable   not palpable        DORSALIS PEDIS      ANTERIOR TIBIAL faintly palpable  faintly palpable     Gastrointestinal: soft, nontender, BS WNL, no r/g,  no palpable masses. PD catheter in place, indwelling dialysate.  Musculoskeletal: no muscle atrophy/wasting. M/S 5/5 throughout, extremities without ischemic changes.  Neurologic: A&O X 3; Appropriate Affect, Speech is normal CN 2-12 intact, pain and light touch intact in extremities, Motor exam as listed above.   Non-Invasive Vascular Imaging CAROTID DUPLEX 03/14/2015   CEREBROVASCULAR DUPLEX EVALUATION     INDICATION: Carotid artery disease; evaluation  status post stent placement.    PREVIOUS INTERVENTION(S): Right angioplasty and stent 08/21/2014; Left carotid endarterectomy 04/01/2011    DUPLEX EXAM:     RIGHT  LEFT  Peak Systolic Velocities (cm/s) End Diastolic Velocities (cm/s) Plaque LOCATION Peak Systolic Velocities (cm/s) End Diastolic Velocities (cm/s) Plaque  58 10  CCA PROXIMAL 104 13   49 9  CCA MID 99 14   55 11  CCA DISTAL 82 9   474 44 HT ECA 255 29   Stent   ICA PROXIMAL 73 12 Focal CP  Stent   ICA MID 94 27   118 18  ICA DISTAL  104 27     N/A ICA / CCA Ratio (PSV) N/A  Antegrade Vertebral Flow Antegrade  - Brachial Systolic Pressure (mmHg) -  Triphasic Brachial Artery Waveforms Triphasic    Peak Systolic Velocities (cm/s) End Diastolic Velocities (cm/s) Plaque STENT (  ) Peak Systolic Velocities (cm/s) End Diastolic Velocities (cm/s) Plaque  114 18  PROXIMAL     105 22  MID     108 18  DISTAL       Plaque Morphology:  HM = Homogeneous, HT = Heterogeneous, CP = Calcific Plaque, SP = Smooth Plaque, IP = Irregular Plaque    ADDITIONAL FINDINGS:     IMPRESSION: 1. Patent right stent with no evidence for restenosis. 2. Bilateral external carotid artery stenosis. 3. Patent left carotid endarterectomy with no evidence for restenosis.    Compared to the previous exam:  No prior complete exam performed      Assessment: Victor Lane is a 69 y.o. male who is status post right angioplasty and stent on 08/21/2014, and left carotid endarterectomy on 04/01/2011. He has no history of stroke or TIA. Today's carotid duplex suggests a patent right stent with no evidence for restenosis, and a patent left carotid endarterectomy with no evidence for restenosis.   Plan: Follow-up in 6 months with Carotid Duplex.   I discussed in depth with the patient the nature of atherosclerosis, and emphasized the importance of maximal medical management including strict control of blood pressure, blood glucose, and lipid levels, obtaining regular exercise, and continued cessation of smoking.  The patient is aware that without maximal medical management the underlying atherosclerotic disease process will progress, limiting the benefit of any interventions. The patient was given information about stroke prevention and what symptoms should prompt the patient to seek immediate medical care. Thank you for allowing [place] to participate in this patient's care.  Clemon Chambers, RN, MSN, FNP-C [facility] Office:  [phone]  Clinic Physician: Oneida Alar  03/14/2015 2:58 PM

## 2015-03-18 NOTE — Addendum Note (Signed)
Addended by: Dorthula Rue L on: 03/18/2015 10:25 AM   Modules accepted: Orders

## 2015-06-23 DIAGNOSIS — Z9289 Personal history of other medical treatment: Secondary | ICD-10-CM

## 2015-06-23 HISTORY — DX: Personal history of other medical treatment: Z92.89

## 2015-07-19 ENCOUNTER — Other Ambulatory Visit: Payer: Self-pay | Admitting: Nephrology

## 2015-07-19 ENCOUNTER — Ambulatory Visit
Admission: RE | Admit: 2015-07-19 | Discharge: 2015-07-19 | Disposition: A | Discharge status: Home / Self Care | Payer: Medicare Other | Source: Ambulatory Visit | Attending: Nephrology | Admitting: Nephrology

## 2015-07-19 DIAGNOSIS — R0689 Other abnormalities of breathing: Secondary | ICD-10-CM

## 2015-09-16 ENCOUNTER — Telehealth: Payer: Self-pay | Admitting: Oncology

## 2015-09-16 ENCOUNTER — Encounter: Payer: Self-pay | Admitting: Oncology

## 2015-09-16 NOTE — Telephone Encounter (Signed)
Verified address and insurance, contacted referring provider, mailed new pt packet, scheduled in take

## 2015-09-19 ENCOUNTER — Encounter (HOSPITAL_COMMUNITY): Payer: Medicare Other

## 2015-09-19 ENCOUNTER — Ambulatory Visit: Payer: Medicare Other | Admitting: Family

## 2015-09-20 ENCOUNTER — Encounter: Payer: Self-pay | Admitting: Family

## 2015-09-24 ENCOUNTER — Telehealth: Payer: Self-pay | Admitting: Oncology

## 2015-09-24 ENCOUNTER — Ambulatory Visit (HOSPITAL_BASED_OUTPATIENT_CLINIC_OR_DEPARTMENT_OTHER): Payer: Medicare Other | Admitting: Oncology

## 2015-09-24 VITALS — BP 165/67 | HR 93 | Temp 98.7°F | Resp 20 | Ht 70.0 in | Wt 191.8 lb

## 2015-09-24 DIAGNOSIS — Z8521 Personal history of malignant neoplasm of larynx: Secondary | ICD-10-CM

## 2015-09-24 DIAGNOSIS — D631 Anemia in chronic kidney disease: Secondary | ICD-10-CM

## 2015-09-24 DIAGNOSIS — D638 Anemia in other chronic diseases classified elsewhere: Secondary | ICD-10-CM

## 2015-09-24 DIAGNOSIS — N186 End stage renal disease: Secondary | ICD-10-CM | POA: Diagnosis not present

## 2015-09-24 NOTE — Consult Note (Signed)
Reason for Referral: Anemia   HPI: 70 year old gentleman native of [place] currently living in this area and have done so for many years. With history of hypertension, diabetes and renal failure developed an 2014. 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 He has been on dialysis dependent for the last few years. He was initially receiving hemodialysis and subsequently been receiving peritoneal dialysis. He has been receiving growth factor support because of anemia of renal disease. His hemoglobin have ranged between 10 and 11 through at this time. His hemoglobin did get down to 8.7 in March 2016. His most recent CBC obtained on 08/23/2015 showed a hemoglobin of 8.5, MCV 103 and a white cell count of 7.4. He had a normal differential and normal platelet count. His electrolyte showed a normal protein and globulin. His iron was 91 with saturation of 40%. He have received packed red cell transfusion on one occasion in the past but remotely. He has been receiving growth factor support initially with Procrit and most recently had been receiving a different preparation every 2 weeks. He has been receiving the same preparation every 4 weeks although have been less effective as of late. Clinically, he reports fatigue and tiredness but still very functional. 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 He has not reported any bleeding per rectum, hematuria or hemoptysis. He does not report any GI complications. His appetite have been reasonable and his weight is stable.  He does not report any headaches, blurry vision, syncope or seizures. He does not report any fevers, chills or sweats. Does not report any cough, wheezing or hemoptysis. Does not report any nausea, vomiting or abdominal pain. Does not report any arthralgias, myalgias. He does not report any lymphadenopathy or petechiae. Does not  report any major changes in his performance status. Remaining review of systems unremarkable.   Past Medical History  Diagnosis Date  . Neuropathy   . H/O vitamin D deficiency   . Hearing loss   . Hypercholesteremia   . 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 S/P radiation therapy 01/24/2013-03/14/2013    Larynx/glottis / 63 Gy in 28 fractions  . Hypertension   . GERD (gastroesophageal reflux disease)   . Sleep apnea     to request from Dr.Fried was done about 89yr ago but no cpap d/t weight loss  . Fibromyalgia     takes tramadol daily as needed  . Myalgia and myositis   . Peripheral neuropathy   . History of colon polyps   . Cataracts, bilateral   . Dilated aortic root   . Aortic valve sclerosis   . Diastolic dysfunction   . Aortic regurgitation   . Complication of anesthesia     hard to wake up and then panics waking up  . Type II diabetes mellitus     "diet controlled now" (08/03/2014)  . Arthritis     "just age related" (08/03/2014)  . Coronary artery disease     severe CAD with mid RCA occlusion, distal LAD stenosis, moderate disease of the left circ. Repeat cath 07/2014 for worsening LVF showed severe disease of the mid LAD, mild to mod LCX disease, occluded PL of the RCA with left to right collaterals to distal RCA territory s/p PCI of the LAD with DES.  It was noted that if  patient develops CP resistant to medical therapy could consider PCI of RCA  . ESRD on peritoneal dialysis 09/2013    "followed by [name]; [facility]" (08/03/2014)  . Carotid artery occlusion     s/p left CEA with 40-59% restenosis and >80% stenosis of the Right ICA - followed by Dr. Kellie Simmering  . Chronic combined systolic and diastolic CHF (congestive heart failure)   :  Past Surgical History  Procedure Laterality Date  . Cholecystectomy  1993  . Shoulder open rotator cuff repair Right 07/06/2011  . Carotid endarterectomy Left   .  Colonoscopy w/ biopsies and polypectomy      Hx: of  . Microlaryngoscopy with laser N/A 01/04/2013    Procedure: MICROLARYNGOSCOPY WITH BIOPSY/LASER;  Surgeon: [name], MD;  Location: [place];  Service: ENT;  Laterality: N/A;  . Av fistula placement Left 02/23/2013    Procedure: ARTERIOVENOUS (AV) FISTULA CREATION;  Surgeon: Conrad [place], MD;  Location: [place];  Service: Vascular;  Laterality: Left;  . Capd insertion N/A 08/01/2013    Procedure: LAPAROSCOPIC INSERTION CONTINUOUS AMBULATORY PERITONEAL DIALYSIS CATHETER;  Surgeon: [name], MD;  Location: [place];  Service: General;  Laterality: N/A;  . Laparoscopic abdominal exploration N/A 08/01/2013    Procedure: LAPAROSCOPIC ABDOMINAL EXPLORATION;  Surgeon: [name], MD;  Location: [place];  Service: General;  Laterality: N/A;  . Ulnar nerve transposition Left 02/22/2014    Procedure: LEFT ULNAR NERVE RELEASE AND OR TRANSPOSITION;  Surgeon: [name], MD;  Location: [place];  Service: Orthopedics;  Laterality: Left;  . Left heart catheterization with coronary angiogram N/A 08/14/2013    Procedure: LEFT HEART CATHETERIZATION WITH CORONARY ANGIOGRAM;  Surgeon: [name], MD;  Location: [facility] CATH LAB;  Service: Cardiovascular;  Laterality: N/A;  . Coronary angioplasty  08/03/2014  . Cardiac catheterization  07/2013  . Tracheostomy  01/2013  . Tracheostomy closure  02/2013  . Left and right heart catheterization with coronary angiogram N/A 08/03/2014    Procedure: LEFT AND RIGHT HEART CATHETERIZATION WITH CORONARY ANGIOGRAM;  Surgeon: [name], MD;  [facility] CATH LAB;  Service: Cardiovascular;  Laterality: N/A;  . Carotid stent insertion N/A 08/21/2014    Procedure: CAROTID STENT INSERTION;  Surgeon: [name], MD;  Location: [facility] CATH LAB;  Service: Cardiovascular;  Laterality: N/A;  :   Current outpatient prescriptions:  .  acetaminophen (TYLENOL) 500 MG tablet, Take 1,000 mg by mouth every 6 (six) hours as needed  for mild pain or headache. , Disp: , Rfl:  .  aspirin EC 81 MG tablet, Take 81 mg by mouth daily., Disp: , Rfl:  .  calcium acetate (PHOSLO) 667 MG capsule, Take 1,334 mg by mouth as directed. Take 2 capsules (1334 mg) with each meal., Disp: , Rfl:  .  gabapentin (NEURONTIN) 100 MG capsule, Take 100 mg by mouth 2 (two) times daily., Disp: , Rfl:  .  ipratropium (ATROVENT) 0.06 % nasal spray, Place 2 sprays into both nostrils 2 (two) times daily. , Disp: , Rfl:  .  lisinopril (PRINIVIL,ZESTRIL) 5 MG tablet, TAKE 1 TABLET (5 MG TOTAL) BY MOUTH DAILY., Disp: 30 tablet, Rfl: 6 .  multivitamin (RENA-VIT) TABS tablet, Take 1 tablet by mouth daily., Disp: , Rfl:  .  mupirocin ointment (BACTROBAN) 2 %, Apply 1 application topically 2 (two) times daily as needed (rash). , Disp: , Rfl:  .  rosuvastatin (CRESTOR) 5 MG tablet, Take 1 tablet by mouth as directed  up to once daily (Patient taking differently: Take 5 mg by mouth 3 (three) times a week. Monday, Wednesday, Friday at noon), Disp: 90 tablet, Rfl: 3 .  ticagrelor (BRILINTA) 90 MG TABS tablet, Take 1 tablet (90 mg total) by mouth 2 (two) times daily., Disp: 60 tablet, Rfl: 10 .  traMADol (ULTRAM) 50 MG tablet, Take 100 mg by mouth every 4 (four) hours as needed (pain)., Disp: , Rfl:  .  triamcinolone cream (KENALOG) 0.1 %, Apply 1 application topically 2 (two) times daily as needed (rash). , Disp: , Rfl:  .  nitroGLYCERIN (NITROSTAT) 0.4 MG SL tablet, Place 1 tablet (0.4 mg total) under the tongue every 5 (five) minutes as needed for chest pain. (Patient not taking: Reported on 01/25/2015), Disp: 25 tablet, Rfl: 2:  Allergies  Allergen Reactions  . Fentanyl Other (See Comments)    Confusion when patch applied  . Nsaids Other (See Comments)    Severe kidney disease/ESRD  . Amoxicillin Diarrhea and Nausea And Vomiting  . Moxifloxacin Diarrhea and Nausea And Vomiting  :  Family History  Problem Relation Age of Onset  . Adopted: Yes  . Family history  unknown: Yes  :  Social History   Social History  . Marital Status: Married    Spouse Name: Dennie Bible  . Number of Children: 1  . Years of Education: HS   Occupational History  .      Self-Employed   Social History Main Topics  . Smoking status: Former Smoker -- 2.00 packs/day for 31 years    Types: Cigarettes    Quit date: 08/07/1995  . Smokeless tobacco: Never Used     Comment: quit smoking in 1997  . Alcohol Use: No  . Drug Use: No  . Sexual Activity: Yes   Other Topics Concern  . Not on file   Social History Narrative   Patient lives at home with spouse.   Caffiene Use: 1 cup daily  :  Pertinent items are noted in HPI.  Exam: Blood pressure 165/67, pulse 93, temperature 98.7 F (37.1 C), temperature source Oral, resp. rate 20, height 5\' 10"  (1.778 m), weight 191 lb 12.8 oz (87 kg), SpO2 97 %. General appearance: alert and cooperative Head: Normocephalic, without obvious abnormality Throat: lips, mucosa, and tongue normal; teeth and gums normal Neck: no adenopathy Back: negative Resp: clear to auscultation bilaterally Chest wall: no tenderness Cardio: regular rate and rhythm, S1, S2 normal, no murmur, click, rub or gallop GI: soft, non-tender; bowel sounds normal; no masses,  no organomegaly Extremities: extremities normal, atraumatic, no cyanosis or edema Pulses: 2+ and symmetric Skin: Skin color, texture, turgor normal. No rashes or lesions Lymph nodes: Cervical, supraclavicular, and axillary nodes normal.  CBC    Component Value Date/Time   WBC 7.0 08/22/2014 0810   WBC 8.5 03/09/2013 1520   RBC 2.63* 08/22/2014 0810   RBC 2.61* 03/09/2013 1520   RBC 3.71* 02/10/2013 1326   HGB 8.7* 08/22/2014 0810   HGB 7.7* 03/09/2013 1520   HCT 27.6* 08/22/2014 0810   HCT 23.5* 03/09/2013 1520   PLT 181 08/22/2014 0810   PLT 392 03/09/2013 1520   MCV 104.9* 08/22/2014 0810   MCV 90.2 03/09/2013 1520   MCH 33.1 08/22/2014 0810   MCH 29.6 03/09/2013 1520   MCHC  31.5 08/22/2014 0810   MCHC 32.9 03/09/2013 1520   RDW 15.4 08/22/2014 0810   RDW 15.7* 03/09/2013 1520   LYMPHSABS 1.0 07/30/2014 1234   LYMPHSABS 1.2  03/09/2013 1520   MONOABS 0.7 07/30/2014 1234   MONOABS 0.7 03/09/2013 1520   EOSABS 0.3 07/30/2014 1234   EOSABS 0.1 03/09/2013 1520   BASOSABS 0.0 07/30/2014 1234   BASOSABS 0.1 03/09/2013 1520      Chemistry      Component Value Date/Time   NA 138 08/22/2014 0810   NA 141 03/09/2013 1520   K 5.0 08/22/2014 0810   K 4.8 03/09/2013 1520   CL 98 08/22/2014 0810   CO2 25 08/22/2014 0810   CO2 30* 03/09/2013 1520   BUN 58* 08/22/2014 0810   BUN 26.9* 03/09/2013 1520   CREATININE 10.45* 08/22/2014 0810   CREATININE 5.8 Repeated and Verified* 03/09/2013 1520      Component Value Date/Time   CALCIUM 8.5 08/22/2014 0810   CALCIUM 8.8 03/09/2013 1520   ALKPHOS 105 08/29/2013 1730   AST 36 08/29/2013 1730   ALT 15 08/29/2013 1730   BILITOT 0.8 08/29/2013 1730       Assessment and Plan:    70 year old gentleman with the following issues:  1. Anemia of renal disease: This has been documented for the last few years and have been treated with growth factor support with reasonable success to recently. He has been receiving erythropoietin replacement every 2 weeks with hemoglobin most recently down to 8.5. His iron studies have been checked periodically and have received IV iron in the past.  The etiology of his worsening anemia is unclear. There are no signs or symptoms of bleeding to suggest blood losses. His white cell count, platelet count and differential do not support a hematological condition such as myelodysplasia or plasma cell disorder. These are always possibilities but are less likely. It is possible he is developing some tolerance daily current erythropoietin replacement product which is contributing to his worsening anemia.  My recommendation at this point is to switch him to Procrit or Aranesp and potentially  transfuse him if he becomes symptomatic. If this maneuver does not improve hemoglobin then a workup for myelodysplasia and a bone marrow biopsy would be considered.  2. History of laryngeal cancer: No evidence of recurrent disease it is possible that radiation therapy could have caused marrow suppression and contributing to his worsening anemia but he has not received chemotherapy to suggest myelodysplasia.   3. Follow-up: Will be in the next few months

## 2015-09-24 NOTE — Progress Notes (Signed)
Please see consult note.  

## 2015-09-24 NOTE — Telephone Encounter (Signed)
Gave and printed appt sched and avs for pt for Aug °

## 2015-09-26 ENCOUNTER — Encounter: Payer: Self-pay | Admitting: Family

## 2015-09-26 ENCOUNTER — Ambulatory Visit (INDEPENDENT_AMBULATORY_CARE_PROVIDER_SITE_OTHER): Payer: Medicare Other | Admitting: Family

## 2015-09-26 ENCOUNTER — Ambulatory Visit (HOSPITAL_COMMUNITY)
Admission: RE | Admit: 2015-09-26 | Discharge: 2015-09-26 | Disposition: A | Discharge status: Home / Self Care | Payer: Medicare Other | Source: Ambulatory Visit | Attending: Family | Admitting: Family

## 2015-09-26 VITALS — BP 180/78 | HR 77 | Temp 97.9°F | Resp 16 | Ht 70.0 in | Wt 192.0 lb

## 2015-09-26 DIAGNOSIS — Z992 Dependence on renal dialysis: Secondary | ICD-10-CM

## 2015-09-26 DIAGNOSIS — Z95820 Peripheral vascular angioplasty status with implants and grafts: Secondary | ICD-10-CM | POA: Insufficient documentation

## 2015-09-26 DIAGNOSIS — Z87891 Personal history of nicotine dependence: Secondary | ICD-10-CM | POA: Insufficient documentation

## 2015-09-26 DIAGNOSIS — I6523 Occlusion and stenosis of bilateral carotid arteries: Secondary | ICD-10-CM

## 2015-09-26 DIAGNOSIS — Z9889 Other specified postprocedural states: Secondary | ICD-10-CM | POA: Diagnosis not present

## 2015-09-26 DIAGNOSIS — N186 End stage renal disease: Secondary | ICD-10-CM

## 2015-09-26 DIAGNOSIS — Z959 Presence of cardiac and vascular implant and graft, unspecified: Secondary | ICD-10-CM

## 2015-09-26 NOTE — Progress Notes (Signed)
Chief Complaint: Extracranial Carotid Artery Stenosis   History of Present Illness  Victor Lane is a 70 y.o. male  patient of [name] who is status post right ICA angioplasty and stent on 08/21/2014, and left carotid endarterectomy on 04/01/2011.  He returns today for follow up. He states his diabetes has been well controlled. He is currently on Brilinta and aspirin for antiplatelet therapy. His Brilinta was started after recent coronary procedure. The patient denies any history of TIA or stroke symptoms, specifically the patient denies a history of amaurosis fugax or monocular blindness, denies a history unilateral of facial drooping, denies a history of hemiplegia, and denies a history of receptive or expressive aphasia.  Pt states he has some neuropathy in his lower legs, worse at night: tingling and cramping.   Wife states she controls pt's blood pressure by changing his PD dialysate, he sees Dr. [name]. He has been on PD since 2014. His cardiologist and nephrologist are aware of his labile blood pressure and are working with pt to get it under control; is low at times.  Pt Diabetic: yes, wife states controlled by dialysate adjustments Pt smoker: former smoker, quit in 1997  Pt meds include: Statin : yes ASA: yes Other anticoagulants/antiplatelets: Brilinta  Past Medical History  Diagnosis Date  . Neuropathy ([place])   . H/O vitamin D deficiency   . Hearing loss   . Hypercholesteremia   . 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 S/P radiation therapy 01/24/2013-03/14/2013    Larynx/glottis / 63 Gy in 28 fractions  . Hypertension   . GERD (gastroesophageal reflux disease)   . Sleep apnea     to request from Dr.Fried was done about 78yrs ago but no cpap d/t weight loss  . Fibromyalgia     takes tramadol daily as needed  . Myalgia and myositis   . Peripheral neuropathy ([place])   . History of  colon polyps   . Cataracts, bilateral   . Dilated aortic root ([place])   . Aortic valve sclerosis   . Diastolic dysfunction   . Aortic regurgitation   . Complication of anesthesia     hard to wake up and then panics waking up  . Type II diabetes mellitus ([place])     "diet controlled now" (08/03/2014)  . Arthritis     "just age related" (08/03/2014)  . Coronary artery disease     severe CAD with mid RCA occlusion, distal LAD stenosis, moderate disease of the left circ. Repeat cath 07/2014 for worsening LVF showed severe disease of the mid LAD, mild to mod LCX disease, occluded PL of the RCA with left to right collaterals to distal RCA territory s/p PCI of the LAD with DES.  It was noted that if patient develops CP resistant to medical therapy could consider PCI of RCA  . ESRD on peritoneal dialysis ([place]) 09/2013    "followed by [name]; [facility]" (08/03/2014)  . Carotid artery occlusion     s/p left CEA with 40-59% restenosis and >80% stenosis of the Right ICA - followed by Dr. Kellie Simmering  . Chronic combined systolic and diastolic CHF (congestive heart failure) ([place])     Social History Social History  Substance Use Topics  . Smoking status: Former Smoker -- 2.00 packs/day for 31 years    Types: Cigarettes    Quit date: 08/07/1995  . Smokeless tobacco: Never Used  Comment: quit smoking in 1997  . Alcohol Use: No    Family History Family History  Problem Relation Age of Onset  . Adopted: Yes  . Family history unknown: Yes    Surgical History Past Surgical History  Procedure Laterality Date  . Cholecystectomy  1993  . Shoulder open rotator cuff repair Right 07/06/2011  . Carotid endarterectomy Left   . Colonoscopy w/ biopsies and polypectomy      Hx: of  . Microlaryngoscopy with laser N/A 01/04/2013    Procedure: MICROLARYNGOSCOPY WITH BIOPSY/LASER;  Surgeon: [name], MD;  Location: [place];  Service: ENT;  Laterality: N/A;  . Av fistula placement Left 02/23/2013     Procedure: ARTERIOVENOUS (AV) FISTULA CREATION;  Surgeon: [name], MD;  Location: [place];  Service: Vascular;  Laterality: Left;  . Capd insertion N/A 08/01/2013    Procedure: LAPAROSCOPIC INSERTION CONTINUOUS AMBULATORY PERITONEAL DIALYSIS CATHETER;  Surgeon: [name], MD;  Location: [place];  Service: General;  Laterality: N/A;  . Laparoscopic abdominal exploration N/A 08/01/2013    Procedure: LAPAROSCOPIC ABDOMINAL EXPLORATION;  Surgeon: [name], MD;  Location: [place];  Service: General;  Laterality: N/A;  . Ulnar nerve transposition Left 02/22/2014    Procedure: LEFT ULNAR NERVE RELEASE AND OR TRANSPOSITION;  Surgeon: [name], MD;  Location: [place];  Service: Orthopedics;  Laterality: Left;  . Left heart catheterization with coronary angiogram N/A 08/14/2013    Procedure: LEFT HEART CATHETERIZATION WITH CORONARY ANGIOGRAM;  Surgeon: [name], MD;  Location: [facility] CATH LAB;  Service: Cardiovascular;  Laterality: N/A;  . Coronary angioplasty  08/03/2014  . Cardiac catheterization  07/2013  . Tracheostomy  01/2013  . Tracheostomy closure  02/2013  . Left and right heart catheterization with coronary angiogram N/A 08/03/2014    Procedure: LEFT AND RIGHT HEART CATHETERIZATION WITH CORONARY ANGIOGRAM;  Surgeon: [name], MD;  Location: [facility] CATH LAB;  Service: Cardiovascular;  Laterality: N/A;  . Carotid stent insertion N/A 08/21/2014    Procedure: CAROTID STENT INSERTION;  Surgeon: [name], MD;  Location: [facility] CATH LAB;  Service: Cardiovascular;  Laterality: N/A;    Allergies  Allergen Reactions  . Fentanyl Other (See Comments)    Confusion when patch applied  . Nsaids Other (See Comments)    Severe kidney disease/ESRD  . Amoxicillin Diarrhea and Nausea And Vomiting  . Moxifloxacin Diarrhea and Nausea And Vomiting    Current Outpatient Prescriptions  Medication Sig Dispense Refill  . acetaminophen (TYLENOL) 500 MG tablet Take 1,000 mg by mouth every 6  (six) hours as needed for mild pain or headache.     Marland Kitchen aspirin EC 81 MG tablet Take 81 mg by mouth daily.    . calcium acetate (PHOSLO) 667 MG capsule Take 1,334 mg by mouth as directed. Take 2 capsules (1334 mg) with each meal.    . gabapentin (NEURONTIN) 100 MG capsule Take 100 mg by mouth 2 (two) times daily.    Marland Kitchen ipratropium (ATROVENT) 0.06 % nasal spray Place 2 sprays into both nostrils 2 (two) times daily.     Marland Kitchen lisinopril (PRINIVIL,ZESTRIL) 5 MG tablet TAKE 1 TABLET (5 MG TOTAL) BY MOUTH DAILY. 30 tablet 6  . multivitamin (RENA-VIT) TABS tablet Take 1 tablet by mouth daily.    . mupirocin ointment (BACTROBAN) 2 % Apply 1 application topically 2 (two) times daily as needed (rash).     . nitroGLYCERIN (NITROSTAT) 0.4 MG SL tablet Place 1 tablet (0.4  mg total) under the tongue every 5 (five) minutes as needed for chest pain. 25 tablet 2  . rosuvastatin (CRESTOR) 5 MG tablet Take 1 tablet by mouth as directed up to once daily (Patient taking differently: Take 5 mg by mouth 3 (three) times a week. Monday, Wednesday, Friday at noon) 90 tablet 3  . ticagrelor (BRILINTA) 90 MG TABS tablet Take 1 tablet (90 mg total) by mouth 2 (two) times daily. 60 tablet 10  . traMADol (ULTRAM) 50 MG tablet Take 100 mg by mouth every 4 (four) hours as needed (pain).    . triamcinolone cream (KENALOG) 0.1 % Apply 1 application topically 2 (two) times daily as needed (rash).      No current facility-administered medications for this visit.    Review of Systems : See HPI for pertinent positives and negatives.  Physical Examination  Filed Vitals:   09/26/15 1407  BP: 180/78  Pulse: 77  Temp: 97.9 F (36.6 C)  TempSrc: Oral  Resp: 16  Height: 5\' 10"  (1.778 m)  Weight: 192 lb (87.091 kg)  SpO2: 97%   Body mass index is 27.55 kg/(m^2).  General: WDWN male in NAD GAIT: normal Eyes: PERRLA Pulmonary: Non-labored, CTAB, no rales, no rhonchi, & no wheezing.  Cardiac: regular rhythm, no detected  murmur.  VASCULAR EXAM Carotid Bruits Right Left   Negative Negative   Aorta is not palpable. Radial pulses are 2+ palpable and equal.      LE Pulses Right Left   POPLITEAL not palpable  not palpable   POSTERIOR TIBIAL not palpable  not palpable    DORSALIS PEDIS  ANTERIOR TIBIAL faintly palpable  faintly palpable     Gastrointestinal: soft, nontender, BS WNL, no r/g, no palpable masses. PD catheter in place, indwelling dialysate.  Musculoskeletal: no muscle atrophy/wasting. M/S 5/5 throughout, extremities without ischemic changes.  Neurologic: A&O X 3; Appropriate Affect, Speech is normal CN 2-12 intact, pain and light touch intact in extremities, Motor exam as listed above.          Non-Invasive Vascular Imaging CAROTID DUPLEX 09/26/2015   CEREBROVASCULAR DUPLEX EVALUATION     INDICATION: Carotid artery disease; evaluation status post stent placement.    PREVIOUS INTERVENTION(S): Right angioplasty and stent 08/21/2014; Left carotid endarterectomy 04/01/2011    DUPLEX EXAM:     RIGHT  LEFT  Peak Systolic Velocities (cm/s) End Diastolic Velocities (cm/s) Plaque LOCATION Peak Systolic Velocities (cm/s) End Diastolic Velocities (cm/s) Plaque  51 9  CCA PROXIMAL 71 8   49 9  CCA MID 113 16   45 10  CCA DISTAL 94 17   400 74 HT ECA 205 14   Stent   ICA PROXIMAL 96 24 Focal CP  Stent   ICA MID 90 23   116 19  ICA DISTAL 103 27     N/A ICA / CCA Ratio (PSV) N/A  Antegrade Vertebral Flow Antegrade  - Brachial Systolic Pressure (mmHg) -  Triphasic Brachial Artery Waveforms Triphasic    Peak Systolic Velocities (cm/s) End Diastolic Velocities (cm/s) Plaque STENT (  ) Peak Systolic Velocities (cm/s) End Diastolic Velocities (cm/s) Plaque  140 34  PROXIMAL     140 31  MID     130 23  DISTAL        Plaque Morphology:  HM = Homogeneous, HT = Heterogeneous, CP = Calcific Plaque, SP = Smooth Plaque, IP = Irregular Plaque    ADDITIONAL FINDINGS: See page 2 for interpretation.  IMPRESSION: 1. Patent right stent with no evidence for restenosis. 2. Bilateral external carotid artery stenosis. 3. Patent left carotid endarterectomy with no evidence for restenosis.    Compared to the previous exam:  No significant changes noted when compared to previous exam.      Assessment: Victor Lane is a 70 y.o. male who is status post right ICA angioplasty and stent on 08/21/2014, and left carotid endarterectomy on 04/01/2011.  He has no history of stroke or TIA. Today's carotid duplex suggests a patent right stent with no evidence for restenosis, and a patent left carotid endarterectomy with no evidence for restenosis.    Plan: Follow-up in 1 year with Carotid Duplex scan.   I discussed in depth with the patient the nature of atherosclerosis, and emphasized the importance of maximal medical management including strict control of blood pressure, blood glucose, and lipid levels, obtaining regular exercise, and continued cessation of smoking.  The patient is aware that without maximal medical management the underlying atherosclerotic disease process will progress, limiting the benefit of any interventions. The patient was given information about stroke prevention and what symptoms should prompt the patient to seek immediate medical care. Thank you for allowing [place] to participate in this patient's care.  Clemon Chambers, RN, MSN, FNP-C [facility] Office: [phone]  Clinic Physician: Oneida Alar  09/26/2015 2:36 PM

## 2015-10-01 ENCOUNTER — Telehealth: Payer: Self-pay

## 2015-10-01 NOTE — Telephone Encounter (Signed)
Phone call from pt's wife.  Questioned about if pt. needs to continue taking Brilinta 90 mg BID?  Reported that Dr. Wyline Copas with [facility] advised the pt. To call the Vascular surgeon.  Advised pt's wife that in the chart notes from the Cardiac Stent in 07/2014, it was noted that the pt. "should remain on Brilinta for at least a year and likely longer."  Also, it was noted that the pt. "could be changed to Plavix down the road."  The pt's wife stated the patient tried Plavix and it made him sick.  Advised pt's wife, this nurse will contact Dr. Rhetta Mura office to determine if they are leaving the decision on re-dosing the Brilinta to Vascular surgery, with hx. of PCA and DES of LAD, and hx of right Internal Carotid artery angioplasty and Stent.  Agreed.

## 2015-10-01 NOTE — Telephone Encounter (Signed)
Attempted to call nurse for Dr. Wyline Copas @ [facility]. Left msg to call back re: question of re-dosing the Brilinta.

## 2015-10-03 NOTE — Progress Notes (Signed)
Victor Lane presents for follow up of radiation completed 03/14/13 to his Larynx/Glottis.      Pain issues, if any?: No Using a feeding tube?: No Weight changes, if any:  Wt Readings from Last 3 Encounters:  10/04/15 193 lb 6.4 oz (87.726 kg)  09/26/15 192 lb (87.091 kg)  09/24/15 191 lb 12.8 oz (87 kg)   Swallowing issues, if any: He is eating softer foods but only related to loss of teeth. He reports being able to eat well without difficulty.  Smoking or chewing tobacco? No Using fluoride trays daily? He is using fluoride toothpaste only Last ENT visit was on: 04/23/15 Dr. Janace Hoard. He does not have a future appointment scheduled at this time.  Other notable issues, if any: They have questions related to his Thyroid function, he just had a TSH drawn today.   BP 137/60 mmHg  Pulse 71  Temp(Src) 98 F (36.7 C)  Ht 5\' 10"  (1.778 m)  Wt 193 lb 6.4 oz (87.726 kg)  BMI 27.75 kg/m2  SpO2 98%

## 2015-10-04 ENCOUNTER — Ambulatory Visit
Admission: RE | Admit: 2015-10-04 | Discharge: 2015-10-04 | Disposition: A | Discharge status: Home / Self Care | Payer: Medicare Other | Source: Ambulatory Visit | Attending: Radiation Oncology | Admitting: Radiation Oncology

## 2015-10-04 ENCOUNTER — Encounter: Payer: Self-pay | Admitting: Radiation Oncology

## 2015-10-04 VITALS — BP 137/60 | HR 71 | Temp 98.0°F | Ht 70.0 in | Wt 193.4 lb

## 2015-10-04 DIAGNOSIS — R634 Abnormal weight loss: Secondary | ICD-10-CM

## 2015-10-04 DIAGNOSIS — Z7982 Long term (current) use of aspirin: Secondary | ICD-10-CM | POA: Diagnosis not present

## 2015-10-04 DIAGNOSIS — C320 Malignant neoplasm of glottis: Secondary | ICD-10-CM

## 2015-10-04 MED ORDER — LARYNGOSCOPY SOLUTION RAD-ONC
15.0000 mL | Freq: Once | TOPICAL | Status: AC
Start: 2015-10-04 — End: 2015-10-04
  Administered 2015-10-04: 15 mL via TOPICAL

## 2015-10-04 NOTE — Progress Notes (Signed)
.  Radiation Oncology         [phone]) [phone] ________________________________  Name: Victor Lane MRN: [id number]  Date: 10/04/2015  DOB: May 12, 1946  Follow-Up Visit Note  Oupatient  REFERRING: [name] MD  CC: [name], MD / [name], MD Corliss Parish, MD      ICD-9-CM ICD-10-CM   1. Loss of weight 783.21 R63.4 TSH  2. 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 [name] presents for follow up of radiation completed 03/14/13 to his larynx/glottis. He denies any new pains, denies swallowing issues. He is eating softer foods but only related to loss of teeth. He reports being able to eat well without difficulty.   His last ENT visit was on 04/23/15 with Dr.Byers. He does not have a future appointment scheduled at this time. He had his TSH drawn today.  He is being followed by Dr. Alen Blew concerning his low hemoglobin levels.                              ALLERGIES:  is allergic to fentanyl; nsaids; amoxicillin; and moxifloxacin.  Meds: Current Outpatient Prescriptions  Medication Sig Dispense Refill  . acetaminophen (TYLENOL) 500 MG tablet Take 1,000 mg by mouth every 6 (six) hours as needed for mild pain or headache.     Marland Kitchen aspirin EC 81 MG tablet Take 81 mg by mouth daily.    . calcium acetate (PHOSLO) 667 MG capsule Take 1,334 mg by mouth as directed. Take 2 capsules (1334 mg) with each meal.    . gabapentin (NEURONTIN) 100 MG capsule Take 100 mg by mouth 2 (two) times daily.    Marland Kitchen ipratropium (ATROVENT) 0.06 % nasal spray Place 2 sprays into both nostrils 2 (two) times daily.     Marland Kitchen lisinopril (PRINIVIL,ZESTRIL) 5 MG tablet TAKE 1 TABLET (5 MG TOTAL) BY MOUTH DAILY. 30 tablet 6  . metoprolol succinate (TOPROL-XL) 50 MG 24 hr  tablet Take 50 mg by mouth 2 (two) times daily. Take with or immediately following a meal.    . multivitamin (RENA-VIT) TABS tablet Take 1 tablet by mouth daily.    . mupirocin ointment (BACTROBAN) 2 % Apply 1 application topically 2 (two) times daily as needed (rash).     . nitroGLYCERIN (NITROSTAT) 0.4 MG SL tablet Place 1 tablet (0.4 mg total) under the tongue every 5 (five) minutes as needed for chest pain. 25 tablet 2  . rosuvastatin (CRESTOR) 5 MG tablet Take 1 tablet by mouth as directed up to once daily (Patient taking differently: Take 5 mg by mouth 3 (three) times a week. Monday, Wednesday, Friday at noon) 90 tablet 3  . ticagrelor (BRILINTA) 90 MG TABS tablet Take 1 tablet (90 mg total) by mouth 2 (two) times daily. 60 tablet 10  . triamcinolone cream (KENALOG) 0.1 % Apply 1 application topically 2 (two) times daily as needed (rash).     . traMADol (ULTRAM) 50 MG tablet Take 100 mg by mouth every 4 (four) hours as needed (pain).     No current facility-administered medications for this encounter.    Physical Findings: The patient is in no acute distress. Patient is alert and oriented.  height is 5\' 10"  (1.778 m) and  weight is 193 lb 6.4 oz (87.726 kg). His temperature is 98 F (36.7 C). His blood pressure is 137/60 and his pulse is 71. His oxygen saturation is 98%. .  -Dentures removed for exam   -Oral cavity and oropharynx show no lesions.  - No palpable masses in his cervical or supraclavicular regions present. -Telangiectasia in prior treatment fields.   PROCEDURE NOTE: After anesthetizing the nasal cavity with topical lidocaine and phenylephrine, the flexible endoscope was introduced and passed through the nasal cavity. Pharynx appears normal. Epiglottis appears normal. False cords appear normal. True vocal cords are without lesions and are symmetrically mobile.   Lab Findings: Lab Results  Component Value Date   WBC 7.0 08/22/2014   HGB 8.7* 08/22/2014   HCT 27.6*  08/22/2014   MCV 104.9* 08/22/2014   PLT 181 08/22/2014   Lab Results  Component Value Date   TSH 0.869 01/25/2015    Radiographic Findings: No results found.  Impression/Plan: No evidence of disease recurrence on clinical exam today. I recommend that the patient continue follow up with Dr.Byers in 6 months, his wife will call to schedule this. He will follow up with Mike Craze NP of survivorship for physical exam and laryngoscopy and TSH in 1 year. Encouraged the patient to stay up to date with yearly colonoscopies. I'll see him back PRN.  _____________________________________   Eppie Gibson, MD   This document serves as a record of services personally performed by Eppie Gibson, MD. It was created on her behalf by Derek Mound, a trained medical scribe. The creation of this record is based on the scribe's personal observations and the provider's statements to them. This document has been checked and approved by the attending provider.

## 2015-10-07 ENCOUNTER — Telehealth: Payer: Self-pay

## 2015-10-07 ENCOUNTER — Telehealth: Payer: Self-pay | Admitting: *Deleted

## 2015-10-07 LAB — TSH: TSH: 1.358 m(IU)/L (ref 0.320–4.118)

## 2015-10-07 NOTE — Telephone Encounter (Signed)
I called and spoke to Victor Lane and let him know that is Thyroid function is good. He appreciated the phone call, and knows to call if he has any further questions or concerns.

## 2015-10-07 NOTE — Telephone Encounter (Signed)
[name], MD  Joline Salt Wally Behan, RN           Aspirin is fine with me if he is having a problem with Brilinta otherwise keep it going if he can't take plavix       Called pt. Informed of [name] recommendation to continue Brilinta if he is able to tolerate it.  The pt. stated he is bruising frequently, and had to go to Urgent Care last weekend due to an area on his arm that the skin peeled back, and started bleeding; stated it was more than I could handle by myself.  Voiced concern that because he is an active person, the bruising is an ongoing problem, while taking the Brilinta.  Advised will update [name].

## 2015-10-07 NOTE — Telephone Encounter (Signed)
CALLED PATIENT TO INFORM OF SURVIVORSHIP APPT. ON 10-08-2016 @ 1:30 PM WITH GRETCHEN DAWSON, SPOKE WITH PATIENT AND HE IS AWARE OF THIS APPT.

## 2015-10-07 NOTE — Telephone Encounter (Signed)
Spoke with Sonia Side @ Dr. Rhetta Mura office on 10/03/15.  Reported that per Dr. Wyline Copas is deferring recommendation to continue Brilinta to Vascular Surgeon.; reported Dr. Wyline Copas doesn't feel he needs Brilinta from Cardiology standpoint.  Will discuss with [name].

## 2015-10-10 ENCOUNTER — Other Ambulatory Visit: Payer: Self-pay | Admitting: Adult Health

## 2015-10-10 DIAGNOSIS — C320 Malignant neoplasm of glottis: Secondary | ICD-10-CM

## 2015-10-10 DIAGNOSIS — R5383 Other fatigue: Secondary | ICD-10-CM

## 2015-10-10 NOTE — Telephone Encounter (Addendum)
[name], MD  Joline Salt Ravinder Lukehart, RN           Fine switch to aspirin      Rec'd a verbal order from [name] to have pt. continue to take ASA 81 mg qd.   Notified pt. Per phone of instructions to d/c Brilinta, and to continue ASA 81 mg. Qd.  Pt. Verb. Understanding.

## 2015-10-21 ENCOUNTER — Other Ambulatory Visit: Payer: Self-pay | Admitting: *Deleted

## 2015-10-21 DIAGNOSIS — I6523 Occlusion and stenosis of bilateral carotid arteries: Secondary | ICD-10-CM

## 2015-10-23 ENCOUNTER — Encounter: Payer: Self-pay | Admitting: Oncology

## 2015-10-28 ENCOUNTER — Ambulatory Visit (HOSPITAL_COMMUNITY)
Admission: RE | Admit: 2015-10-28 | Discharge: 2015-10-28 | Disposition: A | Discharge status: Home / Self Care | Payer: Medicare Other | Source: Ambulatory Visit | Attending: Family Medicine | Admitting: Family Medicine

## 2015-10-28 DIAGNOSIS — D631 Anemia in chronic kidney disease: Secondary | ICD-10-CM | POA: Diagnosis not present

## 2015-10-28 DIAGNOSIS — N186 End stage renal disease: Secondary | ICD-10-CM | POA: Diagnosis not present

## 2015-10-28 LAB — PREPARE RBC (CROSSMATCH)

## 2015-10-28 LAB — ABO/RH: ABO/RH(D): O POS

## 2015-10-28 MED ORDER — DIPHENHYDRAMINE HCL 25 MG PO CAPS
50.0000 mg | ORAL_CAPSULE | Freq: Once | ORAL | Status: AC
  Administered 2015-10-28: 50 mg via ORAL
  Filled 2015-10-28: qty 2

## 2015-10-28 MED ORDER — SODIUM CHLORIDE 0.9 % IV SOLN
Freq: Once | INTRAVENOUS | Status: AC
  Administered 2015-10-28: 11:00:00 via INTRAVENOUS

## 2015-10-28 MED ORDER — ACETAMINOPHEN 325 MG PO TABS
650.0000 mg | ORAL_TABLET | Freq: Once | ORAL | Status: AC
  Administered 2015-10-28: 650 mg via ORAL
  Filled 2015-10-28: qty 2

## 2015-10-28 NOTE — Discharge Instructions (Signed)
Blood Transfusion, Care After Refer to this sheet in the next few weeks. These instructions provide you with information about caring for yourself after your procedure. Your health care provider may also give you more specific instructions. Your treatment has been planned according to current medical practices, but problems sometimes occur. Call your health care provider if you have any problems or questions after your procedure. WHAT TO EXPECT AFTER THE PROCEDURE After your procedure, it is common to have:  Bruising and soreness at the IV site.  Chills or fever.  Headache. HOME CARE INSTRUCTIONS  Take medicines only as directed by your health care provider. Ask your health care provider if you can take an over-the-counter pain reliever in case you have a fever or headache a day or two after your transfusion.  Return to your normal activities as directed by your health care provider. SEEK MEDICAL CARE IF:   You develop redness or irritation at your IV site.  You have persistent fever, chills, or headache.  Your urine is darker than normal.  Your urine turns pink, red, or brown.   The white part of your eye turns yellow (jaundice).   You feel weak after doing your normal activities.  SEEK IMMEDIATE MEDICAL CARE IF:   You have trouble breathing.  You have fever and chills along with:  Anxiety.  Chest or back pain.  Flushed skin.  Clammy skin.  A rapid heartbeat.  Nausea.   This information is not intended to replace advice given to you by your health care provider. Make sure you discuss any questions you have with your health care provider.   Document Released: 06/29/2014 Document Reviewed: 06/29/2014 Elsevier Interactive Patient Education 2016 [place]. Blood Transfusion  A blood transfusion is a procedure in which you receive donated blood through an IV tube. You may need a blood transfusion because of illness, surgery, or injury. The blood may come from a  donor, or it may be your own blood that you donated previously. The blood given in a transfusion is made up of different types of cells. You may receive:  Red blood cells. These carry oxygen and replace lost blood.  Platelets. These control bleeding.  Plasma. Thishelps blood to clot. If you have hemophilia or another clotting disorder, you may also receive other types of blood products. LET [facility] CARE PROVIDER KNOW ABOUT:  Any allergies you have.  All medicines you are taking, including vitamins, herbs, eye drops, creams, and over-the-counter medicines.  Previous problems you or members of your family have had with the use of anesthetics.  Any blood disorders you have.  Previous surgeries you have had.  Any medical conditions you may have.  Any previous reactions you have had during a blood transfusion.  RISKS AND COMPLICATIONS Generally, this is a safe procedure. However, problems may occur, including:  Having an allergic reaction to something in the donated blood.  Fever. This may be a reaction to the white blood cells in the transfused blood.  Iron overload. This can happen from having many transfusions.  Transfusion-related acute lung injury (TRALI). This is a rare reaction that causes lung damage. The cause is not known.TRALI can occur within hours of a transfusion or several days later.  Sudden (acute) or delayed hemolytic reactions. This happens if your blood does not match the cells in your transfusion. Your body's defense system (immune system) may try to attack the new cells. This complication is rare.  Infection. This is rare. BEFORE THE PROCEDURE  You may have a blood test to determine your blood type. This is necessary to know what kind of blood your body will accept.  If you are going to have a planned surgery, you may donate your own blood. This may be done in case you need to have a transfusion.  If you have had an allergic reaction to a  transfusion in the past, you may be given medicine to help prevent a reaction. Take this medicine only as directed by your health care provider.  You will have your temperature, blood pressure, and pulse monitored before the transfusion. PROCEDURE   An IV will be started in your hand or arm.  The bag of donated blood will be attached to your IV tube and given into your vein.  Your temperature, blood pressure, and pulse will be monitored regularly during the transfusion. This monitoring is done to detect early signs of a transfusion reaction.  If you have any signs or symptoms of a reaction, your transfusion will be stopped and you may be given medicine.  When the transfusion is over, your IV will be removed.  Pressure may be applied to the IV site for a few minutes.  A bandage (dressing) will be applied. The procedure may vary among health care providers and hospitals. AFTER THE PROCEDURE  Your blood pressure, temperature, and pulse will be monitored regularly.   This information is not intended to replace advice given to you by your health care provider. Make sure you discuss any questions you have with your health care provider.   Document Released: 06/05/2000 Document Revised: 06/29/2014 Document Reviewed: 04/18/2014 Elsevier Interactive Patient Education 2016 [place]. Blood Transfusion, Care After Refer to this sheet in the next few weeks. These instructions provide you with information about caring for yourself after your procedure. Your health care provider may also give you more specific instructions. Your treatment has been planned according to current medical practices, but problems sometimes occur. Call your health care provider if you have any problems or questions after your procedure. WHAT TO EXPECT AFTER THE PROCEDURE After your procedure, it is common to have:  Bruising and soreness at the IV site.  Chills or fever.  Headache. HOME CARE INSTRUCTIONS  Take  medicines only as directed by your health care provider. Ask your health care provider if you can take an over-the-counter pain reliever in case you have a fever or headache a day or two after your transfusion.  Return to your normal activities as directed by your health care provider. SEEK MEDICAL CARE IF:   You develop redness or irritation at your IV site.  You have persistent fever, chills, or headache.  Your urine is darker than normal.  Your urine turns pink, red, or brown.   The white part of your eye turns yellow (jaundice).   You feel weak after doing your normal activities.  SEEK IMMEDIATE MEDICAL CARE IF:   You have trouble breathing.  You have fever and chills along with:  Anxiety.  Chest or back pain.  Flushed skin.  Clammy skin.  A rapid heartbeat.  Nausea.   This information is not intended to replace advice given to you by your health care provider. Make sure you discuss any questions you have with your health care provider.   Document Released: 06/29/2014 Document Reviewed: 06/29/2014 Elsevier Interactive Patient Education [employer].

## 2015-10-28 NOTE — Progress Notes (Signed)
Anemia of chronic disease - Primary    ICD:10-CM: D63.8   MD: Dr. Alen Blew  Procedure: Blood was drawn from patient for a type and crossmatch for 2 units of PRBCs. Pt received 2 units of PRBCs and premeds prior to that.  Pt tolerated the procedure well.  Post procedure: Pt was alert, oriented and ambulatory at discharge

## 2015-10-29 LAB — TYPE AND SCREEN
ABO/RH(D): O POS
Antibody Screen: NEGATIVE
UNIT DIVISION: 0
UNIT DIVISION: 0

## 2016-01-31 ENCOUNTER — Ambulatory Visit (HOSPITAL_BASED_OUTPATIENT_CLINIC_OR_DEPARTMENT_OTHER): Payer: Medicare Other | Admitting: Oncology

## 2016-01-31 ENCOUNTER — Telehealth: Payer: Self-pay | Admitting: Oncology

## 2016-01-31 VITALS — BP 153/51 | HR 77 | Temp 97.7°F | Resp 17 | Ht 70.0 in | Wt 192.8 lb

## 2016-01-31 DIAGNOSIS — N189 Chronic kidney disease, unspecified: Secondary | ICD-10-CM | POA: Diagnosis present

## 2016-01-31 DIAGNOSIS — D631 Anemia in chronic kidney disease: Secondary | ICD-10-CM | POA: Diagnosis not present

## 2016-01-31 DIAGNOSIS — D509 Iron deficiency anemia, unspecified: Secondary | ICD-10-CM | POA: Diagnosis not present

## 2016-01-31 DIAGNOSIS — D638 Anemia in other chronic diseases classified elsewhere: Secondary | ICD-10-CM

## 2016-01-31 NOTE — Telephone Encounter (Signed)
per pof to sch pt appt-*gae pt copy of avs

## 2016-01-31 NOTE — Progress Notes (Signed)
Hematology and Oncology Follow Up Visit  Victor Lane [id number] Jul 30, 1945 70 y.o. 01/31/2016 2:05 PM FRIED, Victor Lane, MDFried, Victor [place], MD   Principle Diagnosis: 70 year old gentleman with anemia of renal disease. He was seen in consultation in April 2017 with a hemoglobin of 8.7.  Current therapy: Periodic packed red cell transfusion and growth factor support.  Interim History: Victor Lane is as today for a follow-up visit. He is a gentleman I saw in consultation back in April 2017 with worsening anemia despite growth factor support. Since the last visit, he has been receiving Procrit with hemodialysis which helped his overall hemoglobin. His hemoglobin was 8.2 in May 2017 and received 2 units of packed red cell transfusion. He has not received any transfusions since that time and his last hemoglobin on 01/28/2016 was 9.9. Previously his hemoglobin was 9.2 on 01/23/2016. He had a normal white cell count and normal platelet count. His differential was normal. His iron level showed a normal range of 52. He also reported dental surgery since the last visit which might of contributed to his worsening anemia in the spring of 2017.  Clinically, he reports few complaints including fatigue and tiredness but no chest pain or shortness of breath. He denied any dyspnea on exertion. He denied any hematochezia or melena. He denies any change in his bowel habits. He denies any epistaxis.  He does not report any headaches, blurry vision, syncope or seizures. He does not report any fevers, chills or sweats. Does not report any cough, wheezing or hemoptysis. Does not report any nausea, vomiting or abdominal pain. Does not report any arthralgias, myalgias. He does not report any lymphadenopathy or petechiae. Does not report any major changes in his performance status. Remaining review of systems unremarkable  Medications: I have reviewed the patient's current medications.  Current Outpatient Prescriptions   Medication Sig Dispense Refill  . acetaminophen (TYLENOL) 500 MG tablet Take 1,000 mg by mouth every 6 (six) hours as needed for mild pain or headache.     Marland Kitchen aspirin 325 MG EC tablet Take 325 mg by mouth daily.    . calcium acetate (PHOSLO) 667 MG capsule Take 1,334 mg by mouth as directed. Take 2 capsules (1334 mg) with each meal.    . gabapentin (NEURONTIN) 100 MG capsule Take 100 mg by mouth 2 (two) times daily.    Marland Kitchen ipratropium (ATROVENT) 0.06 % nasal spray Place 2 sprays into both nostrils 2 (two) times daily.     Marland Kitchen lisinopril (PRINIVIL,ZESTRIL) 5 MG tablet TAKE 1 TABLET (5 MG TOTAL) BY MOUTH DAILY. 30 tablet 6  . metoprolol succinate (TOPROL-XL) 50 MG 24 hr tablet Take 50 mg by mouth 2 (two) times daily. Take with or immediately following a meal.    . multivitamin (RENA-VIT) TABS tablet Take 1 tablet by mouth daily.    . mupirocin ointment (BACTROBAN) 2 % Apply 1 application topically 2 (two) times daily as needed (rash).     . rosuvastatin (CRESTOR) 5 MG tablet Take 1 tablet by mouth as directed up to once daily (Patient taking differently: Take 5 mg by mouth 3 (three) times a week. Monday, Wednesday, Friday at noon) 90 tablet 3  . traMADol (ULTRAM) 50 MG tablet Take 100 mg by mouth every 4 (four) hours as needed (pain).    . triamcinolone cream (KENALOG) 0.1 % Apply 1 application topically 2 (two) times daily as needed (rash).     . nitroGLYCERIN (NITROSTAT) 0.4 MG SL tablet Place 1 tablet (0.4  mg total) under the tongue every 5 (five) minutes as needed for chest pain. (Patient not taking: Reported on 01/31/2016) 25 tablet 2   No current facility-administered medications for this visit.      Allergies:  Allergies  Allergen Reactions  . Fentanyl Other (See Comments)    Confusion when patch applied  . Nsaids Other (See Comments)    Severe kidney disease/ESRD  . Amoxicillin Diarrhea and Nausea And Vomiting  . Moxifloxacin Diarrhea and Nausea And Vomiting    Past Medical History,  Surgical history, Social history, and Family History were reviewed and updated.   Physical Exam: Blood pressure (!) 153/51, pulse 77, temperature 97.7 F (36.5 C), temperature source Oral, resp. rate 17, height '5\' 10"'$  (1.778 m), weight 192 lb 12.8 oz (87.5 kg), SpO2 94 %. ECOG: 1 General appearance: alert and cooperative Head: Normocephalic, without obvious abnormality Neck: no adenopathy Lymph nodes: Cervical, supraclavicular, and axillary nodes normal. Heart:regular rate and rhythm, S1, S2 normal, no murmur, click, rub or gallop Lung:chest clear, no wheezing, rales, normal symmetric air entry Abdomin: soft, non-tender, without masses or organomegaly EXT:no erythema, induration, or nodules   Lab Results: Lab Results  Component Value Date   WBC 7.0 08/22/2014   HGB 8.7 (L) 08/22/2014   HCT 27.6 (L) 08/22/2014   MCV 104.9 (H) 08/22/2014   PLT 181 08/22/2014     Chemistry      Component Value Date/Time   NA 138 08/22/2014 0810   NA 141 03/09/2013 1520   K 5.0 08/22/2014 0810   K 4.8 03/09/2013 1520   CL 98 08/22/2014 0810   CO2 25 08/22/2014 0810   CO2 30 (H) 03/09/2013 1520   BUN 58 (H) 08/22/2014 0810   BUN 26.9 (H) 03/09/2013 1520   CREATININE 10.45 (H) 08/22/2014 0810   CREATININE 5.8 Repeated and Verified (HH) 03/09/2013 1520      Component Value Date/Time   CALCIUM 8.5 08/22/2014 0810   CALCIUM 8.8 03/09/2013 1520   ALKPHOS 105 08/29/2013 1730   AST 36 08/29/2013 1730   ALT 15 08/29/2013 1730   BILITOT 0.8 08/29/2013 1730      Impression and Plan:   70 year old gentleman with the following issues:  1. Multifactorial anemia: He has an element of anemia of renal disease and currently he receives growth factor support with dialysis. His hemoglobin appeared to be stable in the last few months and has not required a transfusion since May 2017.  Other considerations that could be contributing to his anemia would include bone marrow toxicity from previous  radiation therapy. Myelodysplastic syndrome could also be a possibility given his age and could be contributing to his overall anemia. I have discussed with him the role of a bone marrow biopsy in the future if he continues to need transfusions in the future.  At this time, I feel that his anemia could be explained by his current disease but certainly further evaluation might be needed in the future.  2. Chronic renal failure: He is currently hemodialysis-dependent.  3. Iron deficiency: He does receive iron supplements as part of his dialysis.  4. Follow-up: Will be in 3 months to follow his clinical progress.   [facility], MD 8/11/20172:05 PM

## 2016-03-28 ENCOUNTER — Emergency Department (HOSPITAL_COMMUNITY): Payer: Medicare Other

## 2016-03-28 ENCOUNTER — Emergency Department (HOSPITAL_COMMUNITY)
Admission: EM | Admit: 2016-03-28 | Discharge: 2016-03-29 | Disposition: A | Discharge status: Home / Self Care | Payer: Medicare Other | Source: Home / Self Care | Attending: Emergency Medicine | Admitting: Emergency Medicine

## 2016-03-28 ENCOUNTER — Encounter (HOSPITAL_COMMUNITY): Payer: Self-pay | Admitting: Emergency Medicine

## 2016-03-28 DIAGNOSIS — I2510 Atherosclerotic heart disease of native coronary artery without angina pectoris: Secondary | ICD-10-CM | POA: Insufficient documentation

## 2016-03-28 DIAGNOSIS — Z955 Presence of coronary angioplasty implant and graft: Secondary | ICD-10-CM | POA: Insufficient documentation

## 2016-03-28 DIAGNOSIS — I5042 Chronic combined systolic (congestive) and diastolic (congestive) heart failure: Secondary | ICD-10-CM

## 2016-03-28 DIAGNOSIS — N186 End stage renal disease: Secondary | ICD-10-CM | POA: Insufficient documentation

## 2016-03-28 DIAGNOSIS — E1122 Type 2 diabetes mellitus with diabetic chronic kidney disease: Secondary | ICD-10-CM

## 2016-03-28 DIAGNOSIS — Z7984 Long term (current) use of oral hypoglycemic drugs: Secondary | ICD-10-CM | POA: Insufficient documentation

## 2016-03-28 DIAGNOSIS — Z8521 Personal history of malignant neoplasm of larynx: Secondary | ICD-10-CM

## 2016-03-28 DIAGNOSIS — K659 Peritonitis, unspecified: Secondary | ICD-10-CM

## 2016-03-28 DIAGNOSIS — Z87891 Personal history of nicotine dependence: Secondary | ICD-10-CM

## 2016-03-28 DIAGNOSIS — E1140 Type 2 diabetes mellitus with diabetic neuropathy, unspecified: Secondary | ICD-10-CM

## 2016-03-28 DIAGNOSIS — Z992 Dependence on renal dialysis: Secondary | ICD-10-CM

## 2016-03-28 DIAGNOSIS — R1084 Generalized abdominal pain: Secondary | ICD-10-CM | POA: Diagnosis not present

## 2016-03-28 DIAGNOSIS — I132 Hypertensive heart and chronic kidney disease with heart failure and with stage 5 chronic kidney disease, or end stage renal disease: Secondary | ICD-10-CM | POA: Insufficient documentation

## 2016-03-28 DIAGNOSIS — Z79899 Other long term (current) drug therapy: Secondary | ICD-10-CM | POA: Insufficient documentation

## 2016-03-28 DIAGNOSIS — K551 Chronic vascular disorders of intestine: Secondary | ICD-10-CM | POA: Diagnosis not present

## 2016-03-28 DIAGNOSIS — Z7982 Long term (current) use of aspirin: Secondary | ICD-10-CM | POA: Insufficient documentation

## 2016-03-28 LAB — CBC
HCT: 34.5 % — ABNORMAL LOW (ref 39.0–52.0)
HEMOGLOBIN: 10.8 g/dL — AB (ref 13.0–17.0)
MCH: 33.6 pg (ref 26.0–34.0)
MCHC: 31.3 g/dL (ref 30.0–36.0)
MCV: 107.5 fL — ABNORMAL HIGH (ref 78.0–100.0)
PLATELETS: 314 10*3/uL (ref 150–400)
RBC: 3.21 MIL/uL — ABNORMAL LOW (ref 4.22–5.81)
RDW: 17.3 % — ABNORMAL HIGH (ref 11.5–15.5)
WBC: 12.5 10*3/uL — ABNORMAL HIGH (ref 4.0–10.5)

## 2016-03-28 LAB — COMPREHENSIVE METABOLIC PANEL
ALBUMIN: 2.5 g/dL — AB (ref 3.5–5.0)
ALK PHOS: 100 U/L (ref 38–126)
ALT: 23 U/L (ref 17–63)
AST: 17 U/L (ref 15–41)
Anion gap: 17 — ABNORMAL HIGH (ref 5–15)
BUN: 43 mg/dL — ABNORMAL HIGH (ref 6–20)
CALCIUM: 9 mg/dL (ref 8.9–10.3)
CO2: 28 mmol/L (ref 22–32)
CREATININE: 9.59 mg/dL — AB (ref 0.61–1.24)
Chloride: 88 mmol/L — ABNORMAL LOW (ref 101–111)
GFR calc Af Amer: 6 mL/min — ABNORMAL LOW (ref >60)
GFR calc non Af Amer: 5 mL/min — ABNORMAL LOW (ref >60)
GLUCOSE: 98 mg/dL (ref 65–99)
Potassium: 2.9 mmol/L — ABNORMAL LOW (ref 3.5–5.1)
SODIUM: 133 mmol/L — AB (ref 135–145)
Total Bilirubin: 0.2 mg/dL — ABNORMAL LOW (ref 0.3–1.2)
Total Protein: 6.6 g/dL (ref 6.5–8.1)

## 2016-03-28 LAB — LIPASE, BLOOD: Lipase: 102 U/L — ABNORMAL HIGH (ref 11–51)

## 2016-03-28 MED ORDER — OXYCODONE-ACETAMINOPHEN 5-325 MG PO TABS
ORAL_TABLET | ORAL | Status: AC
  Filled 2016-03-28: qty 1

## 2016-03-28 MED ORDER — MORPHINE SULFATE (PF) 4 MG/ML IV SOLN
4.0000 mg | Freq: Once | INTRAVENOUS | Status: AC
  Administered 2016-03-28: 4 mg via INTRAVENOUS
  Filled 2016-03-28: qty 1

## 2016-03-28 MED ORDER — OXYCODONE-ACETAMINOPHEN 5-325 MG PO TABS
1.0000 | ORAL_TABLET | Freq: Once | ORAL | Status: AC
  Administered 2016-03-28: 1 via ORAL

## 2016-03-28 MED ORDER — IOPAMIDOL (ISOVUE-300) INJECTION 61%
INTRAVENOUS | Status: AC
  Administered 2016-03-28: 100 mL
  Filled 2016-03-28: qty 100

## 2016-03-28 MED ORDER — ONDANSETRON HCL 4 MG/2ML IJ SOLN
4.0000 mg | Freq: Once | INTRAMUSCULAR | Status: AC
  Administered 2016-03-28: 4 mg via INTRAVENOUS
  Filled 2016-03-28: qty 2

## 2016-03-28 NOTE — ED Notes (Signed)
Pt transported to CT ?

## 2016-03-28 NOTE — ED Notes (Signed)
Pt O2 sat 85% on RA. Elevated head of bed and placed pt on 2L [place].

## 2016-03-28 NOTE — ED Triage Notes (Addendum)
Pt arrives with c/o abdominal pain worsening after peritoneal dialysis today, states he has had a "stomach bug" this week with vomiting and dirrhea for several days. States dialysis took off "ten pounds" of fluid today. Pain worsening since. Tramadol not effective for pain.  Pt reports he doesn't make much urine, no urinanylsis ordered in triage for this reason.  Pt given narcotic pain medicine in triage. Advised of side effects and instructed to avoid driving for a minimum of four hours.

## 2016-03-28 NOTE — ED Provider Notes (Signed)
[facility] DEPT Provider Note   CSN: [id number] Arrival date & time: 03/28/16  1929     History   Chief Complaint Chief Complaint  Patient presents with  . Abdominal Pain    HPI Victor Lane is a 70 y.o. male.  Patient presents to the emergency department with chief complaint of abdominal pain. He states that he has been having lower abdominal pain, nausea, vomiting, diarrhea for the past several days. He reports worsening symptoms. He has tried taking tramadol with no relief. He is a peritoneal dialysis patient, and had dialysis today. 10 pounds of fluid was withdrawn today, he is 1 pound under his dry weight now. Denies any fevers or chills. His wife states that the fluid that was pulled off today was clear. There are no other associated symptoms. Pain is worsened with movement and palpation.   The history is provided by the patient. No language interpreter was used.    Past Medical History:  Diagnosis Date  . Aortic regurgitation   . Aortic valve sclerosis   . Arthritis    "just age related" (08/03/2014)  . Carotid artery occlusion    s/p left CEA with 40-59% restenosis and >80% stenosis of the Right ICA - followed by Dr. Kellie Simmering  . Cataracts, bilateral   . Chronic combined systolic and diastolic CHF (congestive heart failure) ([place])   . Complication of anesthesia    hard to wake up and then panics waking up  . Coronary artery disease    severe CAD with mid RCA occlusion, distal LAD stenosis, moderate disease of the left circ. Repeat cath 07/2014 for worsening LVF showed severe disease of the mid LAD, mild to mod LCX disease, occluded PL of the RCA with left to right collaterals to distal RCA territory s/p PCI of the LAD with DES.  It was noted that if patient develops CP resistant to medical therapy could consider PCI of RCA  . Diastolic dysfunction   . Dilated aortic root ([place])   . ESRD on peritoneal dialysis ([place]) 09/2013   "followed by [name]; [facility]" (08/03/2014)  . Fibromyalgia    takes tramadol daily as needed  . GERD (gastroesophageal reflux disease)   . H/O vitamin D deficiency   . Hearing loss   . History of colon polyps   . Hypercholesteremia   . Hypertension   . Myalgia and myositis   . Neuropathy ([place])   . Peripheral neuropathy ([place])   . S/P radiation therapy 01/24/2013-03/14/2013   Larynx/glottis / 63 Gy in 28 fractions  . Sleep apnea    to request from Dr.Fried was done about 57yrs ago but no cpap d/t weight loss  . Type II diabetes mellitus ([place])    "diet controlled now" (08/03/2014)  . 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 Carotid stenosis 08/21/2014  . Chronic systolic congestive heart failure ([place])   . Dyslipidemia 07/30/2014  . Aortic regurgitation   . CAD (coronary artery disease), native coronary artery 11/20/2013  . Cardiomyopathy, ischemic 11/20/2013  . Dilated aortic root ([place])   . Chronic respiratory failure ([place]) 08/29/2013  . ESRD on dialysis ([place]) 08/29/2013  . Paroxysmal atrial flutter ([place]) 08/10/2013  . Thrombocytopenia ([place]) 08/10/2013  . Leukopenia 08/10/2013  . CAPD (continuous ambulatory peritoneal dialysis) catheter in place 08/07/2013  . Severe sepsis(995.92) 08/06/2013  . Peritonitis (  [place]) 08/06/2013  . HCAP (healthcare-associated pneumonia) 08/06/2013  . Diastasis recti 07/11/2013  . COPD GOLD II 06/29/2013  . Hepatitis 05/22/2013  . Leg weakness, bilateral 04/19/2013  . Pain in limb 04/19/2013  . Neuropathy ([place]) 04/19/2013  . Lumbago 04/19/2013  . Other malaise and fatigue 04/19/2013  . Dysphagia 02/16/2013  . Protein-calorie malnutrition, severe ([place]) 02/12/2013  . Diabetes mellitus ([place]) 02/10/2013  . HTN (hypertension) 02/10/2013  . ESRD (end stage renal disease) on dialysis ([place]) 02/10/2013  . Anemia of chronic disease 02/10/2013  . Transaminitis  02/10/2013  . 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 Carotid artery stenosis 03/24/2011    Past Surgical History:  Procedure Laterality Date  . AV FISTULA PLACEMENT Left 02/23/2013   Procedure: ARTERIOVENOUS (AV) FISTULA CREATION;  Surgeon: Conrad , MD;  Location: [facility];  Service: Vascular;  Laterality: Left;  . CAPD INSERTION N/A 08/01/2013   Procedure: LAPAROSCOPIC INSERTION CONTINUOUS AMBULATORY PERITONEAL DIALYSIS CATHETER;  Surgeon: [name], MD;  Location: [facility];  Service: General;  Laterality: N/A;  . CARDIAC CATHETERIZATION  07/2013  . CAROTID ENDARTERECTOMY Left   . CAROTID STENT INSERTION N/A 08/21/2014   Procedure: CAROTID STENT INSERTION;  Surgeon: [name], MD;  Location: [facility] CATH LAB;  Service: Cardiovascular;  Laterality: N/A;  . CHOLECYSTECTOMY  1993  . COLONOSCOPY W/ BIOPSIES AND POLYPECTOMY     Hx: of  . CORONARY ANGIOPLASTY  08/03/2014  . LAPAROSCOPIC ABDOMINAL EXPLORATION N/A 08/01/2013   Procedure: LAPAROSCOPIC ABDOMINAL EXPLORATION;  Surgeon: [name], MD;  Location: [facility];  Service: General;  Laterality: N/A;  . LEFT AND RIGHT HEART CATHETERIZATION WITH CORONARY ANGIOGRAM N/A 08/03/2014   Procedure: LEFT AND RIGHT HEART CATHETERIZATION WITH CORONARY ANGIOGRAM;  Surgeon: [name], MD;  Location: [facility] CATH LAB;  Service: Cardiovascular;  Laterality: N/A;  . LEFT HEART CATHETERIZATION WITH CORONARY ANGIOGRAM N/A 08/14/2013   Procedure: LEFT HEART CATHETERIZATION WITH CORONARY ANGIOGRAM;  Surgeon: [name], MD;  Location: [facility] CATH LAB;  Service: Cardiovascular;  Laterality: N/A;  . MICROLARYNGOSCOPY WITH LASER N/A 01/04/2013   Procedure: MICROLARYNGOSCOPY WITH BIOPSY/LASER;  Surgeon: [name], MD;  Location: [facility];  Service: ENT;  Laterality: N/A;  . SHOULDER OPEN ROTATOR CUFF REPAIR Right 07/06/2011  . TRACHEOSTOMY  01/2013  . TRACHEOSTOMY CLOSURE  02/2013  . ULNAR NERVE TRANSPOSITION Left 02/22/2014   Procedure: LEFT ULNAR NERVE  RELEASE AND OR TRANSPOSITION;  Surgeon: [name], MD;  Location: [facility];  Service: Orthopedics;  Laterality: Left;       Home Medications    Prior to Admission medications   Medication Sig Start Date End Date Taking? Authorizing Provider  acetaminophen (TYLENOL) 500 MG tablet Take 1,000 mg by mouth every 6 (six) hours as needed for mild pain or headache.    Yes Historical Provider, MD  ALPRAZolam Duanne Moron) 0.5 MG tablet Take 0.5 mg by mouth at bedtime.  02/05/16  Yes Historical Provider, MD  amLODipine (NORVASC) 5 MG tablet Take 5 mg by mouth every morning. 12/02/15  Yes Historical Provider, MD  aspirin 325 MG EC tablet Take 325 mg by mouth daily. 01/31/16  Yes Historical Provider, MD  calcium acetate (PHOSLO) 667 MG capsule Take 667-1,334 mg by mouth See admin instructions. Take 2 capsules (1334 mg) every morning and at lunch. Depending on the evening meal, takes 667 mg (1 tablet) or 1334 mg (2 tablets)   Yes Historical Provider, MD  chlorhexidine (PERIDEX) 0.12 % solution at bedtime. 02/26/16  Yes Historical Provider, MD  DOCOSAHEXAENOIC ACID PO Take 2 g by mouth every morning.   Yes Historical Provider, MD  gabapentin (NEURONTIN) 100 MG capsule Take 100 mg by mouth 2 (two) times daily.   Yes Historical Provider, MD  gentamicin cream (GARAMYCIN) 0.1 % Apply 1 application topically daily. 03/10/16  Yes Historical Provider, MD  glipiZIDE (GLUCOTROL XL) 2.5 MG 24 hr tablet Take 2.5 mg by mouth daily as needed (for high blood sugar).  01/27/16  Yes Historical Provider, MD  ipratropium (ATROVENT) 0.06 % nasal spray Place 2 sprays into both nostrils 2 (two) times daily.    Yes Historical Provider, MD  lisinopril (PRINIVIL,ZESTRIL) 20 MG tablet Take 20 mg by mouth at bedtime. 03/17/16  Yes Historical Provider, MD  lisinopril (PRINIVIL,ZESTRIL) 40 MG tablet Take 40 mg by mouth every morning. 01/26/16  Yes Historical Provider, MD  metoprolol succinate (TOPROL-XL) 50 MG 24 hr tablet Take 50 mg by mouth 2  (two) times daily. Take with or immediately following a meal.   Yes Historical Provider, MD  multivitamin (RENA-VIT) TABS tablet Take 1 tablet by mouth daily.   Yes Historical Provider, MD  mupirocin ointment (BACTROBAN) 2 % Apply 1 application topically 2 (two) times daily as needed (rash).    Yes Historical Provider, MD  nitroGLYCERIN (NITROSTAT) 0.4 MG SL tablet Place 1 tablet (0.4 mg total) under the tongue every 5 (five) minutes as needed for chest pain. 08/05/14  Yes Brittainy Erie Noe, PA-C  omeprazole (PRILOSEC) 20 MG capsule Take 20 mg by mouth every morning. 03/04/16  Yes Historical Provider, MD  rosuvastatin (CRESTOR) 5 MG tablet Take 1 tablet by mouth as directed up to once daily Patient taking differently: Take 5 mg by mouth 3 (three) times a week. Monday, Wednesday, Friday at noon 08/10/14  Yes Sueanne Margarita, MD  traMADol (ULTRAM) 50 MG tablet Take 100 mg by mouth every 4 (four) hours as needed (pain).   Yes Historical Provider, MD  triamcinolone cream (KENALOG) 0.1 % Apply 1 application topically 2 (two) times daily as needed (rash).  08/07/14  Yes Historical Provider, MD  lisinopril (PRINIVIL,ZESTRIL) 5 MG tablet TAKE 1 TABLET (5 MG TOTAL) BY MOUTH DAILY. Patient not taking: Reported on 03/28/2016 01/24/15   Liliane Shi, PA-C    Family History Family History  Problem Relation Age of Onset  . Adopted: Yes  . Family history unknown: Yes    Social History Social History  Substance Use Topics  . Smoking status: Former Smoker    Packs/day: 2.00    Years: 31.00    Types: Cigarettes    Quit date: 08/07/1995  . Smokeless tobacco: Never Used     Comment: quit smoking in 1997  . Alcohol use No     Allergies   Fentanyl; Nsaids; Amoxicillin; and Moxifloxacin   Review of Systems Review of Systems  Gastrointestinal: Positive for abdominal pain.  All other systems reviewed and are negative.    Physical Exam Updated Vital Signs BP (!) 133/49   Pulse 77   Temp 97.8 F  (36.6 C) (Oral)   Resp 18   SpO2 91%   Physical Exam  Constitutional: He is oriented to person, place, and time. He appears well-developed and well-nourished.  HENT:  Head: Normocephalic and atraumatic.  Eyes: Conjunctivae and EOM are normal. Pupils are equal, round, and reactive to light. Right eye exhibits no discharge. Left eye exhibits no discharge. No scleral icterus.  Neck: Normal range of motion. Neck supple.  No JVD present.  Cardiovascular: Normal rate, regular rhythm and normal heart sounds.  Exam reveals no gallop and no friction rub.   No murmur heard. Pulmonary/Chest: Effort normal and breath sounds normal. No respiratory distress. He has no wheezes. He has no rales. He exhibits no tenderness.  Abdominal: Soft. He exhibits no distension and no mass. There is tenderness. There is no rebound and no guarding.  Lower abdomen tender to palpation diffusely  Musculoskeletal: Normal range of motion. He exhibits no edema or tenderness.  Neurological: He is alert and oriented to person, place, and time.  Skin: Skin is warm and dry.  Psychiatric: He has a normal mood and affect. His behavior is normal. Judgment and thought content normal.  Nursing note and vitals reviewed.    ED Treatments / Results  Labs (all labs ordered are listed, but only abnormal results are displayed) Labs Reviewed  LIPASE, BLOOD - Abnormal; Notable for the following:       Result Value   Lipase 102 (*)    All other components within normal limits  COMPREHENSIVE METABOLIC PANEL - Abnormal; Notable for the following:    Sodium 133 (*)    Potassium 2.9 (*)    Chloride 88 (*)    BUN 43 (*)    Creatinine, Ser 9.59 (*)    Albumin 2.5 (*)    Total Bilirubin 0.2 (*)    GFR calc non Af Amer 5 (*)    GFR calc Af Amer 6 (*)    Anion gap 17 (*)    All other components within normal limits  CBC - Abnormal; Notable for the following:    WBC 12.5 (*)    RBC 3.21 (*)    Hemoglobin 10.8 (*)    HCT 34.5 (*)     MCV 107.5 (*)    RDW 17.3 (*)    All other components within normal limits    EKG  EKG Interpretation None       Radiology No results found.  Procedures Procedures (including critical care time)  Medications Ordered in ED Medications  morphine 4 MG/ML injection 4 mg (not administered)  ondansetron (ZOFRAN) injection 4 mg (not administered)  oxyCODONE-acetaminophen (PERCOCET/ROXICET) 5-325 MG per tablet 1 tablet (1 tablet Oral Given 03/28/16 1945)     Initial Impression / Assessment and Plan / ED Course  I have reviewed the triage vital signs and the nursing notes.  Pertinent labs & imaging results that were available during my care of the patient were reviewed by me and considered in my medical decision making (see chart for details).  Clinical Course    Patient with abdominal pain times several days with associated nausea, vomiting, diarrhea. Abdomen is quite tender to palpation. Will check CT and reassess. Lipase is elevated at 102. Patient does not drink alcohol nor does he have gallbladder.  CT shows Mild to moderate ascites, otherwise unremarkable for acute changes.  Patient discussed with Dr. Randal Buba, who recommends nephrology consult.  Discussed the patient with Dr. Jonnie Finner, who recommends peritoneal fluid culture, gram stain, and cell count.  If cell count is great than 50, give loading dose of vancomycin and fortaz and DC to home.  If negative cell count, no abx and DC to home.  Patient signed out to Annamary Carolin, who will continue care and follow-up on cell count.  RN from [facility] will come to draw fluid.  Final Clinical Impressions(s) / ED Diagnoses   Final diagnoses:  None  New Prescriptions New Prescriptions   No medications on file     Montine Circle, PA-C 03/29/16 0047    Davonna Belling, MD 03/29/16 1524

## 2016-03-29 LAB — BODY FLUID CELL COUNT WITH DIFFERENTIAL
EOS FL: 43 %
LYMPHS FL: 5 %
MONOCYTE-MACROPHAGE-SEROUS FLUID: 18 % — AB (ref 50–90)
Neutrophil Count, Fluid: 34 % — ABNORMAL HIGH (ref 0–25)
Total Nucleated Cell Count, Fluid: 1196 cu mm — ABNORMAL HIGH (ref 0–1000)

## 2016-03-29 LAB — GRAM STAIN

## 2016-03-29 MED ORDER — MORPHINE SULFATE (PF) 4 MG/ML IV SOLN
4.0000 mg | Freq: Once | INTRAVENOUS | Status: AC
  Administered 2016-03-29: 4 mg via INTRAVENOUS
  Filled 2016-03-29: qty 1

## 2016-03-29 MED ORDER — DEXTROSE 5 % IV SOLN
1.0000 g | INTRAVENOUS | Status: AC
  Administered 2016-03-29: 1 g via INTRAVENOUS
  Filled 2016-03-29: qty 1

## 2016-03-29 MED ORDER — OXYCODONE-ACETAMINOPHEN 5-325 MG PO TABS: 1.0000 | ORAL_TABLET | Freq: Four times a day (QID) | ORAL | 0 refills | Status: DC | PRN

## 2016-03-29 MED ORDER — OXYCODONE-ACETAMINOPHEN 5-325 MG PO TABS
1.0000 | ORAL_TABLET | Freq: Once | ORAL | Status: AC
  Administered 2016-03-29: 1 via ORAL
  Filled 2016-03-29: qty 1

## 2016-03-29 MED ORDER — POTASSIUM CHLORIDE CRYS ER 20 MEQ PO TBCR
20.0000 meq | EXTENDED_RELEASE_TABLET | Freq: Once | ORAL | Status: AC
  Administered 2016-03-29: 20 meq via ORAL
  Filled 2016-03-29: qty 1

## 2016-03-29 MED ORDER — VANCOMYCIN HCL 10 G IV SOLR
2000.0000 mg | INTRAVENOUS | Status: AC
  Administered 2016-03-29: 2000 mg via INTRAVENOUS
  Filled 2016-03-29: qty 2000

## 2016-03-29 NOTE — ED Notes (Signed)
6E RN at bedside to collect peritoneal fluid sample.

## 2016-03-29 NOTE — ED Notes (Signed)
Family walked up to nurses desk, asking about pt being discharged since meds were complete. Family states pt is 'going crazy."

## 2016-03-29 NOTE — ED Notes (Signed)
Pt family requesting pt to be unhooked from wires so he can be discharged.

## 2016-04-01 ENCOUNTER — Encounter (HOSPITAL_COMMUNITY): Payer: Self-pay | Admitting: *Deleted

## 2016-04-01 ENCOUNTER — Inpatient Hospital Stay (HOSPITAL_COMMUNITY)
Admission: EM | Admit: 2016-04-01 | Discharge: 2016-04-04 | DRG: 393 | Discharge status: Against Medical Advice | Payer: Medicare Other | Attending: Internal Medicine | Admitting: Internal Medicine

## 2016-04-01 ENCOUNTER — Emergency Department (HOSPITAL_COMMUNITY): Payer: Medicare Other

## 2016-04-01 DIAGNOSIS — I10 Essential (primary) hypertension: Secondary | ICD-10-CM | POA: Diagnosis not present

## 2016-04-01 DIAGNOSIS — E119 Type 2 diabetes mellitus without complications: Secondary | ICD-10-CM

## 2016-04-01 DIAGNOSIS — E1140 Type 2 diabetes mellitus with diabetic neuropathy, unspecified: Secondary | ICD-10-CM | POA: Diagnosis present

## 2016-04-01 DIAGNOSIS — Z7982 Long term (current) use of aspirin: Secondary | ICD-10-CM | POA: Diagnosis not present

## 2016-04-01 DIAGNOSIS — R1030 Lower abdominal pain, unspecified: Secondary | ICD-10-CM

## 2016-04-01 DIAGNOSIS — Z923 Personal history of irradiation: Secondary | ICD-10-CM

## 2016-04-01 DIAGNOSIS — D638 Anemia in other chronic diseases classified elsewhere: Secondary | ICD-10-CM | POA: Diagnosis present

## 2016-04-01 DIAGNOSIS — Z955 Presence of coronary angioplasty implant and graft: Secondary | ICD-10-CM

## 2016-04-01 DIAGNOSIS — I5042 Chronic combined systolic (congestive) and diastolic (congestive) heart failure: Secondary | ICD-10-CM | POA: Diagnosis present

## 2016-04-01 DIAGNOSIS — E118 Type 2 diabetes mellitus with unspecified complications: Secondary | ICD-10-CM | POA: Diagnosis not present

## 2016-04-01 DIAGNOSIS — N2581 Secondary hyperparathyroidism of renal origin: Secondary | ICD-10-CM | POA: Diagnosis present

## 2016-04-01 DIAGNOSIS — K219 Gastro-esophageal reflux disease without esophagitis: Secondary | ICD-10-CM | POA: Diagnosis present

## 2016-04-01 DIAGNOSIS — I2510 Atherosclerotic heart disease of native coronary artery without angina pectoris: Secondary | ICD-10-CM | POA: Diagnosis present

## 2016-04-01 DIAGNOSIS — Z9049 Acquired absence of other specified parts of digestive tract: Secondary | ICD-10-CM

## 2016-04-01 DIAGNOSIS — Z7984 Long term (current) use of oral hypoglycemic drugs: Secondary | ICD-10-CM | POA: Diagnosis not present

## 2016-04-01 DIAGNOSIS — E8889 Other specified metabolic disorders: Secondary | ICD-10-CM | POA: Diagnosis present

## 2016-04-01 DIAGNOSIS — I132 Hypertensive heart and chronic kidney disease with heart failure and with stage 5 chronic kidney disease, or end stage renal disease: Secondary | ICD-10-CM | POA: Diagnosis present

## 2016-04-01 DIAGNOSIS — R1084 Generalized abdominal pain: Secondary | ICD-10-CM

## 2016-04-01 DIAGNOSIS — E7800 Pure hypercholesterolemia, unspecified: Secondary | ICD-10-CM | POA: Diagnosis present

## 2016-04-01 DIAGNOSIS — G92 Toxic encephalopathy: Secondary | ICD-10-CM | POA: Diagnosis present

## 2016-04-01 DIAGNOSIS — E1122 Type 2 diabetes mellitus with diabetic chronic kidney disease: Secondary | ICD-10-CM | POA: Diagnosis present

## 2016-04-01 DIAGNOSIS — I350 Nonrheumatic aortic (valve) stenosis: Secondary | ICD-10-CM | POA: Diagnosis present

## 2016-04-01 DIAGNOSIS — Z86010 Personal history of colonic polyps: Secondary | ICD-10-CM

## 2016-04-01 DIAGNOSIS — K529 Noninfective gastroenteritis and colitis, unspecified: Secondary | ICD-10-CM | POA: Diagnosis present

## 2016-04-01 DIAGNOSIS — N186 End stage renal disease: Secondary | ICD-10-CM | POA: Diagnosis present

## 2016-04-01 DIAGNOSIS — G629 Polyneuropathy, unspecified: Secondary | ICD-10-CM

## 2016-04-01 DIAGNOSIS — R109 Unspecified abdominal pain: Secondary | ICD-10-CM | POA: Diagnosis present

## 2016-04-01 DIAGNOSIS — K559 Vascular disorder of intestine, unspecified: Secondary | ICD-10-CM

## 2016-04-01 DIAGNOSIS — I255 Ischemic cardiomyopathy: Secondary | ICD-10-CM | POA: Diagnosis present

## 2016-04-01 DIAGNOSIS — J9811 Atelectasis: Secondary | ICD-10-CM | POA: Diagnosis present

## 2016-04-01 DIAGNOSIS — K551 Chronic vascular disorders of intestine: Secondary | ICD-10-CM | POA: Diagnosis not present

## 2016-04-01 DIAGNOSIS — Z992 Dependence on renal dialysis: Secondary | ICD-10-CM

## 2016-04-01 DIAGNOSIS — E785 Hyperlipidemia, unspecified: Secondary | ICD-10-CM | POA: Diagnosis present

## 2016-04-01 DIAGNOSIS — E876 Hypokalemia: Secondary | ICD-10-CM | POA: Diagnosis not present

## 2016-04-01 DIAGNOSIS — C320 Malignant neoplasm of glottis: Secondary | ICD-10-CM | POA: Diagnosis present

## 2016-04-01 DIAGNOSIS — K659 Peritonitis, unspecified: Secondary | ICD-10-CM | POA: Diagnosis present

## 2016-04-01 DIAGNOSIS — T8571XD Infection and inflammatory reaction due to peritoneal dialysis catheter, subsequent encounter: Secondary | ICD-10-CM

## 2016-04-01 DIAGNOSIS — Z87891 Personal history of nicotine dependence: Secondary | ICD-10-CM

## 2016-04-01 DIAGNOSIS — Z79899 Other long term (current) drug therapy: Secondary | ICD-10-CM

## 2016-04-01 LAB — GRAM STAIN

## 2016-04-01 LAB — COMPREHENSIVE METABOLIC PANEL
ALK PHOS: 107 U/L (ref 38–126)
ALT: 24 U/L (ref 17–63)
ANION GAP: 18 — AB (ref 5–15)
AST: 23 U/L (ref 15–41)
Albumin: 2.5 g/dL — ABNORMAL LOW (ref 3.5–5.0)
BILIRUBIN TOTAL: 0.6 mg/dL (ref 0.3–1.2)
BUN: 25 mg/dL — ABNORMAL HIGH (ref 6–20)
CALCIUM: 9.3 mg/dL (ref 8.9–10.3)
CO2: 26 mmol/L (ref 22–32)
CREATININE: 8.45 mg/dL — AB (ref 0.61–1.24)
Chloride: 90 mmol/L — ABNORMAL LOW (ref 101–111)
GFR calc non Af Amer: 6 mL/min — ABNORMAL LOW (ref >60)
GFR, EST AFRICAN AMERICAN: 7 mL/min — AB (ref >60)
GLUCOSE: 164 mg/dL — AB (ref 65–99)
Potassium: 2.5 mmol/L — CL (ref 3.5–5.1)
SODIUM: 134 mmol/L — AB (ref 135–145)
TOTAL PROTEIN: 6.7 g/dL (ref 6.5–8.1)

## 2016-04-01 LAB — CBC WITH DIFFERENTIAL/PLATELET
Basophils Absolute: 0.1 10*3/uL (ref 0.0–0.1)
Basophils Relative: 1 %
EOS ABS: 0.8 10*3/uL — AB (ref 0.0–0.7)
EOS PCT: 8 %
HCT: 35.7 % — ABNORMAL LOW (ref 39.0–52.0)
Hemoglobin: 11.3 g/dL — ABNORMAL LOW (ref 13.0–17.0)
LYMPHS ABS: 0.9 10*3/uL (ref 0.7–4.0)
LYMPHS PCT: 9 %
MCH: 33.7 pg (ref 26.0–34.0)
MCHC: 31.7 g/dL (ref 30.0–36.0)
MCV: 106.6 fL — AB (ref 78.0–100.0)
MONO ABS: 0.7 10*3/uL (ref 0.1–1.0)
Monocytes Relative: 7 %
Neutro Abs: 7.9 10*3/uL — ABNORMAL HIGH (ref 1.7–7.7)
Neutrophils Relative %: 75 %
PLATELETS: 390 10*3/uL (ref 150–400)
RBC: 3.35 MIL/uL — ABNORMAL LOW (ref 4.22–5.81)
RDW: 17.5 % — AB (ref 11.5–15.5)
WBC: 10.4 10*3/uL (ref 4.0–10.5)

## 2016-04-01 LAB — BODY FLUID CELL COUNT WITH DIFFERENTIAL
EOS FL: 37 %
Lymphs, Fluid: 1 %
Monocyte-Macrophage-Serous Fluid: 6 % — ABNORMAL LOW (ref 50–90)
Neutrophil Count, Fluid: 56 % — ABNORMAL HIGH (ref 0–25)
Total Nucleated Cell Count, Fluid: 129 cu mm (ref 0–1000)

## 2016-04-01 LAB — LACTIC ACID, PLASMA
LACTIC ACID, VENOUS: 2.9 mmol/L — AB (ref 0.5–1.9)
Lactic Acid, Venous: 1.6 mmol/L (ref 0.5–1.9)

## 2016-04-01 LAB — LACTATE DEHYDROGENASE, PLEURAL OR PERITONEAL FLUID: LD, Fluid: 41 U/L — ABNORMAL HIGH (ref 3–23)

## 2016-04-01 LAB — I-STAT CG4 LACTIC ACID, ED
Lactic Acid, Venous: 3.22 mmol/L (ref 0.5–1.9)
Lactic Acid, Venous: 1.64 mmol/L (ref 0.5–1.9)

## 2016-04-01 LAB — I-STAT VENOUS BLOOD GAS, ED
Acid-Base Excess: 5 mmol/L — ABNORMAL HIGH (ref 0.0–2.0)
Bicarbonate: 30.4 mmol/L — ABNORMAL HIGH (ref 20.0–28.0)
O2 SAT: 53 %
PCO2 VEN: 45.6 mmHg (ref 44.0–60.0)
PH VEN: 7.431 — AB (ref 7.250–7.430)
PO2 VEN: 28 mmHg — AB (ref 32.0–45.0)
TCO2: 32 mmol/L (ref 0–100)

## 2016-04-01 LAB — ALBUMIN, FLUID (OTHER): Albumin, Fluid: <1 g/dL

## 2016-04-01 LAB — PROTEIN, BODY FLUID

## 2016-04-01 LAB — GLUCOSE, CAPILLARY: Glucose-Capillary: 93 mg/dL (ref 65–99)

## 2016-04-01 LAB — MRSA PCR SCREENING: MRSA BY PCR: NEGATIVE

## 2016-04-01 LAB — GLUCOSE, PERITONEAL FLUID: Glucose, Peritoneal Fluid: 516 mg/dL

## 2016-04-01 LAB — VANCOMYCIN, RANDOM: VANCOMYCIN RM: 31

## 2016-04-01 LAB — CBG MONITORING, ED: Glucose-Capillary: 128 mg/dL — ABNORMAL HIGH (ref 65–99)

## 2016-04-01 MED ORDER — IPRATROPIUM BROMIDE 0.06 % NA SOLN
2.0000 | Freq: Two times a day (BID) | NASAL | Status: DC
  Administered 2016-04-01 – 2016-04-04 (×5): 2 via NASAL
  Filled 2016-04-01: qty 15

## 2016-04-01 MED ORDER — RENA-VITE PO TABS
1.0000 | ORAL_TABLET | Freq: Every day | ORAL | Status: DC
  Administered 2016-04-02 – 2016-04-04 (×3): 1 via ORAL
  Filled 2016-04-01 (×3): qty 1

## 2016-04-01 MED ORDER — DEXTROSE 5 % IV SOLN
Freq: Once | INTRAVENOUS | Status: AC
  Administered 2016-04-01: 06:00:00 via INTRAVENOUS

## 2016-04-01 MED ORDER — SODIUM CHLORIDE 0.9% FLUSH
3.0000 mL | Freq: Two times a day (BID) | INTRAVENOUS | Status: DC
  Administered 2016-04-01 – 2016-04-04 (×6): 3 mL via INTRAVENOUS

## 2016-04-01 MED ORDER — POTASSIUM CHLORIDE 10 MEQ/100ML IV SOLN
10.0000 meq | INTRAVENOUS | Status: AC
  Administered 2016-04-01 (×2): 10 meq via INTRAVENOUS
  Filled 2016-04-01 (×2): qty 100

## 2016-04-01 MED ORDER — ACETAMINOPHEN 650 MG RE SUPP: 650.0000 mg | Freq: Four times a day (QID) | RECTAL | Status: DC | PRN

## 2016-04-01 MED ORDER — SENNOSIDES-DOCUSATE SODIUM 8.6-50 MG PO TABS: 1.0000 | ORAL_TABLET | Freq: Every evening | ORAL | Status: DC | PRN

## 2016-04-01 MED ORDER — ONDANSETRON HCL 4 MG/2ML IJ SOLN
4.0000 mg | Freq: Once | INTRAMUSCULAR | Status: AC
  Administered 2016-04-01: 4 mg via INTRAVENOUS
  Filled 2016-04-01: qty 2

## 2016-04-01 MED ORDER — PIPERACILLIN-TAZOBACTAM IN DEX 2-0.25 GM/50ML IV SOLN
2.2500 g | Freq: Two times a day (BID) | INTRAVENOUS | Status: DC
  Administered 2016-04-01: 2.25 g via INTRAVENOUS
  Filled 2016-04-01 (×2): qty 50

## 2016-04-01 MED ORDER — ONDANSETRON HCL 4 MG PO TABS
4.0000 mg | ORAL_TABLET | Freq: Four times a day (QID) | ORAL | Status: DC | PRN
  Administered 2016-04-02: 4 mg via ORAL
  Filled 2016-04-01: qty 1

## 2016-04-01 MED ORDER — BISACODYL 10 MG RE SUPP: 10.0000 mg | Freq: Every day | RECTAL | Status: DC | PRN

## 2016-04-01 MED ORDER — LISINOPRIL 20 MG PO TABS: 40.0000 mg | ORAL_TABLET | ORAL | Status: DC

## 2016-04-01 MED ORDER — ACETAMINOPHEN 325 MG PO TABS: 650.0000 mg | ORAL_TABLET | Freq: Four times a day (QID) | ORAL | Status: DC | PRN

## 2016-04-01 MED ORDER — METOPROLOL SUCCINATE ER 25 MG PO TB24
50.0000 mg | ORAL_TABLET | Freq: Two times a day (BID) | ORAL | Status: DC
  Administered 2016-04-02 – 2016-04-04 (×3): 50 mg via ORAL
  Filled 2016-04-01 (×3): qty 2

## 2016-04-01 MED ORDER — ROSUVASTATIN CALCIUM 10 MG PO TABS
5.0000 mg | ORAL_TABLET | ORAL | Status: DC
Start: 2016-04-02 — End: 2016-04-04
  Filled 2016-04-01: qty 1

## 2016-04-01 MED ORDER — HYDROMORPHONE HCL 1 MG/ML IJ SOLN
1.0000 mg | INTRAMUSCULAR | Status: AC | PRN
  Administered 2016-04-01 (×2): 1 mg via INTRAVENOUS
  Filled 2016-04-01 (×2): qty 1

## 2016-04-01 MED ORDER — CALCIUM ACETATE (PHOS BINDER) 667 MG PO CAPS
1334.0000 mg | ORAL_CAPSULE | Freq: Two times a day (BID) | ORAL | Status: DC
  Administered 2016-04-02 – 2016-04-04 (×2): 1334 mg via ORAL
  Filled 2016-04-01 (×4): qty 2

## 2016-04-01 MED ORDER — LISINOPRIL 20 MG PO TABS
20.0000 mg | ORAL_TABLET | Freq: Every day | ORAL | Status: DC
  Administered 2016-04-02: 20 mg via ORAL
  Filled 2016-04-01 (×2): qty 1

## 2016-04-01 MED ORDER — GABAPENTIN 100 MG PO CAPS
100.0000 mg | ORAL_CAPSULE | Freq: Two times a day (BID) | ORAL | Status: DC
Start: 2016-04-02 — End: 2016-04-04
  Administered 2016-04-02 – 2016-04-04 (×5): 100 mg via ORAL
  Filled 2016-04-01 (×5): qty 1

## 2016-04-01 MED ORDER — TRAZODONE HCL 50 MG PO TABS: 25.0000 mg | ORAL_TABLET | Freq: Every evening | ORAL | Status: DC | PRN

## 2016-04-01 MED ORDER — INSULIN ASPART 100 UNIT/ML ~~LOC~~ SOLN
0.0000 [IU] | Freq: Three times a day (TID) | SUBCUTANEOUS | Status: DC
  Administered 2016-04-02 (×2): 1 [IU] via SUBCUTANEOUS
  Administered 2016-04-03 – 2016-04-04 (×2): 2 [IU] via SUBCUTANEOUS

## 2016-04-01 MED ORDER — OXYCODONE HCL 5 MG PO TABS
5.0000 mg | ORAL_TABLET | ORAL | Status: DC | PRN
  Administered 2016-04-01 – 2016-04-03 (×4): 5 mg via ORAL
  Filled 2016-04-01 (×6): qty 1

## 2016-04-01 MED ORDER — PANTOPRAZOLE SODIUM 40 MG PO TBEC
40.0000 mg | DELAYED_RELEASE_TABLET | Freq: Every day | ORAL | Status: DC
  Administered 2016-04-02 – 2016-04-04 (×3): 40 mg via ORAL
  Filled 2016-04-01 (×3): qty 1

## 2016-04-01 MED ORDER — CALCIUM ACETATE 667 MG PO CAPS: 667.0000 mg | ORAL_CAPSULE | ORAL | Status: DC

## 2016-04-01 MED ORDER — CALCIUM ACETATE (PHOS BINDER) 667 MG PO CAPS: 667.0000 mg | ORAL_CAPSULE | Freq: Every day | ORAL | Status: DC

## 2016-04-01 MED ORDER — ONDANSETRON HCL 4 MG/2ML IJ SOLN
4.0000 mg | Freq: Four times a day (QID) | INTRAMUSCULAR | Status: DC | PRN
  Administered 2016-04-01: 4 mg via INTRAVENOUS
  Filled 2016-04-01: qty 2

## 2016-04-01 MED ORDER — HYDRALAZINE HCL 20 MG/ML IJ SOLN: 5.0000 mg | Freq: Three times a day (TID) | INTRAMUSCULAR | Status: DC | PRN

## 2016-04-01 MED ORDER — AMLODIPINE BESYLATE 5 MG PO TABS: 5.0000 mg | ORAL_TABLET | ORAL | Status: DC

## 2016-04-01 MED ORDER — CHLORPROMAZINE HCL 25 MG PO TABS
25.0000 mg | ORAL_TABLET | Freq: Three times a day (TID) | ORAL | Status: DC | PRN
  Filled 2016-04-01: qty 1

## 2016-04-01 MED ORDER — CEFTAZIDIME 1 G IJ SOLR
1.0000 g | INTRAMUSCULAR | Status: DC
  Administered 2016-04-02 – 2016-04-03 (×3): 1 g via INTRAVENOUS
  Filled 2016-04-01 (×6): qty 1

## 2016-04-01 MED ORDER — IOPAMIDOL (ISOVUE-370) INJECTION 76%
INTRAVENOUS | Status: AC
Start: 2016-04-01 — End: 2016-04-01
  Administered 2016-04-01: 80 mL via INTRAVENOUS
  Filled 2016-04-01: qty 100

## 2016-04-01 MED ORDER — MAGNESIUM CITRATE PO SOLN: 1.0000 | Freq: Once | ORAL | Status: DC | PRN

## 2016-04-01 MED ORDER — HYDROCODONE-ACETAMINOPHEN 5-325 MG PO TABS: 1.0000 | ORAL_TABLET | ORAL | Status: DC | PRN

## 2016-04-01 MED ORDER — HYDROMORPHONE HCL 1 MG/ML IJ SOLN
0.5000 mg | INTRAMUSCULAR | Status: AC | PRN
  Administered 2016-04-01 (×2): 0.5 mg via INTRAVENOUS
  Filled 2016-04-01 (×2): qty 1

## 2016-04-01 MED ORDER — CHLORHEXIDINE GLUCONATE 0.12 % MT SOLN
15.0000 mL | Freq: Every day | OROMUCOSAL | Status: DC
  Administered 2016-04-01 – 2016-04-02 (×2): 15 mL via OROMUCOSAL
  Filled 2016-04-01 (×3): qty 15

## 2016-04-01 NOTE — Consult Note (Addendum)
[facility] Renal Consultation Note    Indication for Consultation:  Management of ESRD/hemodialysis, anemia, hypertension/volume, and secondary hyperparathyroidism. PCP: Dr. Briscoe Deutscher  HPI: Victor Lane is a 70 y.o. male with ESRD on PD, CAD, Type 2 DM, carotid stenosis, aortic stenosis, GERD, sleep apnea, Hx laryngeal cancer, fibromyalgia who is being admitted with severe lower abdominal pain.  Symptoms started last week when he had severe GI illness with nausea, vomiting, and diarrhea for nearly one week; was barely eating at the time. After this, he had an episode with PD outflow blockage due to fibrin clot which his PD center was able to sort out on 10/6 and get him drained (he reports 10L drained). The next evening, he developed severe lower abdominal pain. He came to the ED on 10/7 for evaluation. Abdominal CT was normal. PD Cx, gram stain, cell count collected. He was found to have increased cell count in PD fluid (1196), concerning for peritonitis. He was started on Vanc/Fortaz and discharged.   On 10/9, he went to his PD unit as directed. Repeat cell count, Cx, gram stain collected and he was re-dosed with Vanc and [place]. Cx pending, but cell count down to 292. That night, he did fine.   But overnight 10/10, while on his cycler, he developed recurrent severe lower abdominal pain and presented back to the ED for evaluation. Pain is constant, located in mid-lower abdomen. No N/V/diarrhea at this time. Denies CP or dyspnea. He is afebrile. Does endorse lower back pain and constipation. No specific hypotensive episode. Pain was so severe that patient did not even drain his last exchange - simply unhooked and came to the ED. PD fluid had not been cloudy, not bloody. Had some fibrin at onset of peritonitis episode ~ 10/7.  No BM recently. Some bilious vomiting. No blood.  Today, labs ok except K 2.5 (was given IV KCl 78mEq x 2) and lactate 3.22. PD fluid cell count down to 129  (coming down). He underwent CT angio of abdomen to evaluate for mesenteric ischemia; has diffuse 50% stenosis arteries but no specific ischemia. Full vascular consult pending. CXR clear except bibasilar atelectasis and small effusions. Received one dose of Zosyn in the ED.   Past Medical History:  Diagnosis Date  . Aortic regurgitation   . Aortic valve sclerosis   . Arthritis    "just age related" (08/03/2014)  . Carotid artery occlusion    s/p left CEA with 40-59% restenosis and >80% stenosis of the Right ICA - followed by Dr. Kellie Simmering  . Cataracts, bilateral   . Chronic combined systolic and diastolic CHF (congestive heart failure) ([place])   . Complication of anesthesia    hard to wake up and then panics waking up  . Coronary artery disease    severe CAD with mid RCA occlusion, distal LAD stenosis, moderate disease of the left circ. Repeat cath 07/2014 for worsening LVF showed severe disease of the mid LAD, mild to mod LCX disease, occluded PL of the RCA with left to right collaterals to distal RCA territory s/p PCI of the LAD with DES.  It was noted that if patient develops CP resistant to medical therapy could consider PCI of RCA  . Diastolic dysfunction   . Dilated aortic root ([place])   . ESRD on peritoneal dialysis ([place]) 09/2013   "followed by [name]; [facility]" (08/03/2014)  . Fibromyalgia    takes tramadol daily as needed  . GERD (gastroesophageal reflux disease)   .  H/O vitamin D deficiency   . Hearing loss   . History of colon polyps   . Hypercholesteremia   . Hypertension   . Myalgia and myositis   . Neuropathy ([place])   . Peripheral neuropathy ([place])   . S/P radiation therapy 01/24/2013-03/14/2013   Larynx/glottis / 63 Gy in 28 fractions  . Sleep apnea    to request from Dr.Fried was done about 33yrs ago but no cpap d/t weight loss  . Type II diabetes mellitus ([place])    "diet controlled now" (08/03/2014)  . 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 AV FISTULA PLACEMENT Left 02/23/2013   Procedure: ARTERIOVENOUS (AV) FISTULA CREATION;  Surgeon: Conrad [place], MD;  Location: [place];  Service: Vascular;  Laterality: Left;  . CAPD INSERTION N/A 08/01/2013   Procedure: LAPAROSCOPIC INSERTION CONTINUOUS AMBULATORY PERITONEAL DIALYSIS CATHETER;  Surgeon: [name], MD;  Location: [place];  Service: General;  Laterality: N/A;  . CARDIAC CATHETERIZATION  07/2013  . CAROTID ENDARTERECTOMY Left   . CAROTID STENT INSERTION N/A 08/21/2014   Procedure: CAROTID STENT INSERTION;  Surgeon: [name], MD;  Location: [facility] CATH LAB;  Service: Cardiovascular;  Laterality: N/A;  . CHOLECYSTECTOMY  1993  . COLONOSCOPY W/ BIOPSIES AND POLYPECTOMY     Hx: of  . CORONARY ANGIOPLASTY  08/03/2014  . LAPAROSCOPIC ABDOMINAL EXPLORATION N/A 08/01/2013   Procedure: LAPAROSCOPIC ABDOMINAL EXPLORATION;  Surgeon: [name], MD;  Location: [place];  Service: General;  Laterality: N/A;  . LEFT AND RIGHT HEART CATHETERIZATION WITH CORONARY ANGIOGRAM N/A 08/03/2014   Procedure: LEFT AND RIGHT HEART CATHETERIZATION WITH CORONARY ANGIOGRAM;  Surgeon: [name], MD;  Location: [facility] CATH LAB;  Service: Cardiovascular;  Laterality: N/A;  . LEFT HEART CATHETERIZATION WITH CORONARY ANGIOGRAM N/A 08/14/2013   Procedure: LEFT HEART CATHETERIZATION WITH CORONARY ANGIOGRAM;  Surgeon: [name], MD;  Location: [facility] CATH LAB;  Service: Cardiovascular;  Laterality: N/A;  . MICROLARYNGOSCOPY WITH LASER N/A 01/04/2013   Procedure: MICROLARYNGOSCOPY WITH BIOPSY/LASER;  Surgeon: [name], MD;  Location: [place];  Service: ENT;  Laterality: N/A;  . SHOULDER OPEN ROTATOR CUFF REPAIR Right 07/06/2011  . TRACHEOSTOMY  01/2013  . TRACHEOSTOMY CLOSURE  02/2013  . ULNAR NERVE TRANSPOSITION Left 02/22/2014   Procedure: LEFT ULNAR NERVE RELEASE AND OR TRANSPOSITION;  Surgeon: [name], MD;  Location: [place];  Service: Orthopedics;  Laterality: Left;   Family History  Problem Relation Age of Onset  . Adopted: Yes  . Family history unknown: Yes   Social History:  reports that he quit smoking about 20 years ago. His smoking use included Cigarettes. He has a 62.00 pack-year smoking history. He has never used smokeless tobacco. He reports that he does not drink alcohol or use drugs.  ROS: As per HPI otherwise negative.  Physical Exam: Vitals:   04/01/16 1330 04/01/16 1400 04/01/16 1430 04/01/16 1459  BP: 125/55 (!) 120/52 135/66 135/66  Pulse: 92 98 99 103  Resp: 18 15 14 20   Temp:      TempSrc:      SpO2: 93% 92% (!) 83% 96%  Weight:      Height:         General: Well developed, well nourished. Restless and in mild distress due to pain at this time. At time of my exam, moderate to severe  abdominal pain, could not sit still Head: Normocephalic, atraumatic, sclera non-icteric, mucus membranes are moist. Neck: Supple without lymphadenopathy/masses. JVD not elevated. Lungs: Clear in B upper lobes. Faint rales in RLL only. Heart: Slightly tachycardic. No murmur or rub. Abdomen: Diffuse abdominal tenderness with + guarding preventing full exam. PD cath appears ok. Exit site and tunnel fine. Abdomen quiet - few tinkles. Diffusely tender. Lower extremities: No LE edema. Neuro: Alert and oriented X 3. Moves all extremities spontaneously. Psych:  Responds to questions appropriately with a normal affect. Dialysis Access: PD cath in mid L abdomen.  Allergies  Allergen Reactions  . Fentanyl Other (See Comments)    Confusion when patch applied  . Nsaids Other (See Comments)    Severe kidney disease/ESRD  . Amoxicillin Diarrhea and Nausea And Vomiting  . Moxifloxacin Diarrhea and Nausea And Vomiting   Prior to Admission medications   Medication Sig Start Date End Date Taking? Authorizing Provider  acetaminophen (TYLENOL) 500 MG tablet Take 1,000 mg by mouth every  6 (six) hours as needed for mild pain or headache.    Yes Historical Provider, MD  ALPRAZolam Duanne Moron) 0.5 MG tablet Take 0.5 mg by mouth at bedtime.  02/05/16  Yes Historical Provider, MD  amLODipine (NORVASC) 5 MG tablet Take 5 mg by mouth every morning. 12/02/15  Yes Historical Provider, MD  aspirin 325 MG EC tablet Take 325 mg by mouth daily. 01/31/16  Yes Historical Provider, MD  calcium acetate (PHOSLO) 667 MG capsule Take 667-1,334 mg by mouth See admin instructions. Take 2 capsules (1334 mg) every morning and at lunch. Depending on the evening meal, takes 667 mg (1 tablet) or 1334 mg (2 tablets)   Yes Historical Provider, MD  chlorhexidine (PERIDEX) 0.12 % solution Use as directed 15 mLs in the mouth or throat at bedtime.  02/26/16  Yes Historical Provider, MD  DOCOSAHEXAENOIC ACID PO Take 2 g by mouth every morning.   Yes Historical Provider, MD  gabapentin (NEURONTIN) 100 MG capsule Take 100 mg by mouth 2 (two) times daily.   Yes Historical Provider, MD  gentamicin cream (GARAMYCIN) 0.1 % Apply 1 application topically daily. 03/10/16  Yes Historical Provider, MD  glipiZIDE (GLUCOTROL XL) 2.5 MG 24 hr tablet Take 2.5 mg by mouth daily as needed (for high blood sugar).  01/27/16  Yes Historical Provider, MD  ipratropium (ATROVENT) 0.06 % nasal spray Place 2 sprays into both nostrils 2 (two) times daily.    Yes Historical Provider, MD  lisinopril (PRINIVIL,ZESTRIL) 20 MG tablet Take 20 mg by mouth at bedtime. 03/17/16  Yes Historical Provider, MD  lisinopril (PRINIVIL,ZESTRIL) 40 MG tablet Take 40 mg by mouth every morning. 01/26/16  Yes Historical Provider, MD  metoprolol succinate (TOPROL-XL) 50 MG 24 hr tablet Take 50 mg by mouth 2 (two) times daily. Take with or immediately following a meal.   Yes Historical Provider, MD  multivitamin (RENA-VIT) TABS tablet Take 1 tablet by mouth daily.   Yes Historical Provider, MD  mupirocin ointment (BACTROBAN) 2 % Apply 1 application topically 2 (two) times daily  as needed (rash).    Yes Historical Provider, MD  nitroGLYCERIN (NITROSTAT) 0.4 MG SL tablet Place 1 tablet (0.4 mg total) under the tongue every 5 (five) minutes as needed for chest pain. 08/05/14  Yes Brittainy Erie Noe, PA-C  omeprazole (PRILOSEC) 20 MG capsule Take 20 mg by mouth every morning. 03/04/16  Yes Historical Provider, MD  oxyCODONE-acetaminophen (PERCOCET/ROXICET) 5-325 MG tablet Take 1 tablet by mouth  every 6 (six) hours as needed for severe pain. 03/29/16  Yes Tiffany Carlota Raspberry, PA-C  rosuvastatin (CRESTOR) 5 MG tablet Take 1 tablet by mouth as directed up to once daily Patient taking differently: Take 5 mg by mouth 3 (three) times a week. Monday, Wednesday, Friday at noon 08/10/14  Yes Sueanne Margarita, MD  traMADol (ULTRAM) 50 MG tablet Take 100 mg by mouth every 4 (four) hours as needed (pain).   Yes Historical Provider, MD  triamcinolone cream (KENALOG) 0.1 % Apply 1 application topically 2 (two) times daily as needed (rash).  08/07/14  Yes Historical Provider, MD  lisinopril (PRINIVIL,ZESTRIL) 5 MG tablet TAKE 1 TABLET (5 MG TOTAL) BY MOUTH DAILY. Patient not taking: Reported on 04/01/2016 01/24/15   Liliane Shi, PA-C   Current Facility-Administered Medications  Medication Dose Route Frequency Provider Last Rate Last Dose  . acetaminophen (TYLENOL) tablet 650 mg  650 mg Oral Q6H PRN Rondel Jumbo, PA-C       Or  . acetaminophen (TYLENOL) suppository 650 mg  650 mg Rectal Q6H PRN Rondel Jumbo, PA-C      . Derrill Memo ON 04/02/2016] amLODipine (NORVASC) tablet 5 mg  5 mg Oral BH-q7a Coralee Pesa Wertman, PA-C      . bisacodyl (DULCOLAX) suppository 10 mg  10 mg Rectal Daily PRN Rondel Jumbo, PA-C      . calcium acetate [facility]) capsule [id number] mg  [id number] mg Oral See admin instructions Rondel Jumbo, PA-C      . chlorhexidine (PERIDEX) 0.12 % solution 15 mL  15 mL Mouth/Throat QHS Rondel Jumbo, PA-C      . Derrill Memo ON 04/02/2016] gabapentin (NEURONTIN) capsule 100 mg  100 mg Oral BID  Rondel Jumbo, PA-C      . hydrALAZINE (APRESOLINE) injection 5-10 mg  5-10 mg Intravenous Q8H PRN Rondel Jumbo, PA-C      . HYDROmorphone (DILAUDID) injection 1 mg  1 mg Intravenous Q4H PRN Waldemar Dickens, MD      . insulin aspart (novoLOG) injection 0-9 Units  0-9 Units Subcutaneous TID WC Rondel Jumbo, PA-C      . ipratropium (ATROVENT) 0.06 % nasal spray 2 spray  2 spray Each Nare BID Rondel Jumbo, PA-C      . Derrill Memo ON 04/02/2016] lisinopril (PRINIVIL,ZESTRIL) tablet 20 mg  20 mg Oral QHS Rondel Jumbo, PA-C      . Derrill Memo ON 04/02/2016] lisinopril (PRINIVIL,ZESTRIL) tablet 40 mg  40 mg Oral BH-q7a Sara E Eldridge, PA-C      . magnesium citrate solution 1 Bottle  1 Bottle Oral Once PRN Rondel Jumbo, PA-C      . Derrill Memo ON 04/02/2016] metoprolol succinate (TOPROL-XL) 24 hr tablet 50 mg  50 mg Oral BID Rondel Jumbo, PA-C      . Derrill Memo ON 04/02/2016] multivitamin (RENA-VIT) tablet 1 tablet  1 tablet Oral Daily Rondel Jumbo, PA-C      . ondansetron [facility]) tablet 4 mg  4 mg Oral Q6H PRN Rondel Jumbo, PA-C       Or  . ondansetron [facility]) injection 4 mg  4 mg Intravenous Q6H PRN Rondel Jumbo, PA-C   4 mg at 04/01/16 1247  . oxyCODONE (Oxy IR/ROXICODONE) immediate release tablet 5 mg  5 mg Oral Q4H PRN Waldemar Dickens, MD   5 mg at 04/01/16 1459  . [START ON 04/02/2016] pantoprazole (PROTONIX) EC tablet 40 mg  40 mg Oral  Daily Rondel Jumbo, PA-C      . piperacillin-tazobactam (ZOSYN) IVPB 2.25 g  2.25 g Intravenous Q12H Valeda Malm Rumbarger, RPH   Stopped at 04/01/16 1317  . [START ON 04/02/2016] rosuvastatin (CRESTOR) tablet 5 mg  5 mg Oral Once per day on Mon Wed Fri Sara E Wertman, PA-C      . senna-docusate (Senokot-S) tablet 1 tablet  1 tablet Oral QHS PRN Rondel Jumbo, PA-C      . sodium chloride flush (NS) 0.9 % injection 3 mL  3 mL Intravenous Q12H Rondel Jumbo, PA-C      . traZODone (DESYREL) tablet 25 mg  25 mg Oral QHS PRN Rondel Jumbo, PA-C       Current  Outpatient Prescriptions  Medication Sig Dispense Refill  . acetaminophen (TYLENOL) 500 MG tablet Take 1,000 mg by mouth every 6 (six) hours as needed for mild pain or headache.     . ALPRAZolam (XANAX) 0.5 MG tablet Take 0.5 mg by mouth at bedtime.     Marland Kitchen amLODipine (NORVASC) 5 MG tablet Take 5 mg by mouth every morning.    Marland Kitchen aspirin 325 MG EC tablet Take 325 mg by mouth daily.    . calcium acetate (PHOSLO) 667 MG capsule Take 667-1,334 mg by mouth See admin instructions. Take 2 capsules (1334 mg) every morning and at lunch. Depending on the evening meal, takes 667 mg (1 tablet) or 1334 mg (2 tablets)    . chlorhexidine (PERIDEX) 0.12 % solution Use as directed 15 mLs in the mouth or throat at bedtime.     . DOCOSAHEXAENOIC ACID PO Take 2 g by mouth every morning.    . gabapentin (NEURONTIN) 100 MG capsule Take 100 mg by mouth 2 (two) times daily.    Marland Kitchen gentamicin cream (GARAMYCIN) 0.1 % Apply 1 application topically daily.    Marland Kitchen glipiZIDE (GLUCOTROL XL) 2.5 MG 24 hr tablet Take 2.5 mg by mouth daily as needed (for high blood sugar).   0  . ipratropium (ATROVENT) 0.06 % nasal spray Place 2 sprays into both nostrils 2 (two) times daily.     Marland Kitchen lisinopril (PRINIVIL,ZESTRIL) 20 MG tablet Take 20 mg by mouth at bedtime.    Marland Kitchen lisinopril (PRINIVIL,ZESTRIL) 40 MG tablet Take 40 mg by mouth every morning.    . metoprolol succinate (TOPROL-XL) 50 MG 24 hr tablet Take 50 mg by mouth 2 (two) times daily. Take with or immediately following a meal.    . multivitamin (RENA-VIT) TABS tablet Take 1 tablet by mouth daily.    . mupirocin ointment (BACTROBAN) 2 % Apply 1 application topically 2 (two) times daily as needed (rash).     . nitroGLYCERIN (NITROSTAT) 0.4 MG SL tablet Place 1 tablet (0.4 mg total) under the tongue every 5 (five) minutes as needed for chest pain. 25 tablet 2  . omeprazole (PRILOSEC) 20 MG capsule Take 20 mg by mouth every morning.    Marland Kitchen oxyCODONE-acetaminophen (PERCOCET/ROXICET) 5-325 MG  tablet Take 1 tablet by mouth every 6 (six) hours as needed for severe pain. 10 tablet 0  . rosuvastatin (CRESTOR) 5 MG tablet Take 1 tablet by mouth as directed up to once daily (Patient taking differently: Take 5 mg by mouth 3 (three) times a week. Monday, Wednesday, Friday at noon) 90 tablet 3  . traMADol (ULTRAM) 50 MG tablet Take 100 mg by mouth every 4 (four) hours as needed (pain).    . triamcinolone cream (KENALOG) 0.1 %  Apply 1 application topically 2 (two) times daily as needed (rash).     Marland Kitchen lisinopril (PRINIVIL,ZESTRIL) 5 MG tablet TAKE 1 TABLET (5 MG TOTAL) BY MOUTH DAILY. (Patient not taking: Reported on 04/01/2016) 30 tablet 6     Recent Labs Lab 03/28/16 1942 04/01/16 0507  NA 133* 134*  K 2.9* 2.5*  CL 88* 90*  CO2 28 26  GLUCOSE 98 164*  BUN 43* 25*  CREATININE 9.59* 8.45*  CALCIUM 9.0 9.3    Recent Labs Lab 03/28/16 1942 04/01/16 0507  AST 17 23  ALT 23 24  ALKPHOS 100 107  BILITOT 0.2* 0.6  PROT 6.6 6.7  ALBUMIN 2.5* 2.5*    Recent Labs Lab 03/28/16 1942  LIPASE 102*    Recent Labs Lab 03/28/16 1942 04/01/16 0507  WBC 12.5* 10.4  NEUTROABS  --  7.9*  HGB 10.8* 11.3*  HCT 34.5* 35.7*  MCV 107.5* 106.6*  PLT 314 390   Results for EVERARD, COURSON (MRN [id number]) as of 04/01/2016 16:42  03/29/2016 01:12 04/01/2016 08:12  Monocyte-Macrophage-Serous Fluid 18 (L) 6 (L)  Other Cells, Fluid  FEW PYKNOTIC NEUT...  Albumin, Fluid  <1.0  Fluid Type-FALB  PERITONEAL CAVITY  Glucose, Peritoneal Fluid  516  Fluid Type-FLDH  PERITONEAL CAVITY  LD, Fluid  41 (H)  Total protein, fluid  <3.0  Fluid Type-FCT PERITONEAL Peritoneal  Color, Fluid STRAW (A) COLORLESS (A)  WBC, Fluid 1,196 (H) 129  Lymphs, Fluid 5 1  Eos, Fluid 43 37  Appearance, Fluid HAZY (A) CLEAR  Neutrophil Count, Fluid 34 (H) 56 (H)   Studies/Results: Dg Chest 2 View  Result Date: 04/01/2016 CLINICAL DATA:  Suspected sepsis.  Peritoneal dialysis patient. EXAM: CHEST  2 VIEW  COMPARISON:  Chest radiograph 07/19/2015.  Abdominal CT 03/28/2016 FINDINGS: Stable cardiomegaly and mediastinal contours. There is thoracic aortic atherosclerosis. Small bilateral pleural effusions and bibasilar airspace disease. No pulmonary edema. No pneumothorax. Vascular stent in the left axilla. Free air under the right hemidiaphragm, likely related to peritoneal dialysis. Resorption of the distal right clavicle. IMPRESSION: Chronic cardiomegaly, stable.  Thoracic aortic atherosclerosis. Small bilateral pleural effusions with bibasilar airspace disease, likely atelectasis. Electronically Signed   By: Jeb Levering M.D.   On: 04/01/2016 05:40   Ct Angio Abd/pel W And/or Wo Contrast  Result Date: 04/01/2016 CLINICAL DATA:  70 year old male with a history of lower abdominal pain and nausea vomiting. Constipation. EXAM: CTA ABDOMEN AND PELVIS WITH CONTRAST TECHNIQUE: Multidetector CT imaging of the abdomen and pelvis was performed using the standard protocol during bolus administration of intravenous contrast. Multiplanar reconstructed images and MIPs were obtained and reviewed to evaluate the vascular anatomy. CONTRAST:  75 cc Isovue 370 COMPARISON:  CT 03/28/2016, 11/26/2014, 08/06/2013 FINDINGS: VASCULAR Aorta: Diffuse atherosclerosis of the abdominal aorta with soft plaque and calcified plaque. No dissection. No aneurysm. No periaortic fluid. Celiac: Atherosclerotic changes at the origin of the celiac artery which is patent. Post stenotic dilation. Typical branch pattern of the celiac artery, with left gastric, common hepatic, and splenic artery identified. SMA: Atherosclerotic changes of the superior mesenteric artery, with mixed calcified and soft plaque at the origin. The SMA appears patent, with greater than 50% stenosis. Renals: Dense calcified and soft plaque at the origin the bilateral renal arteries. Renal arteries are patent. Single left and single right renal artery. IMA: Inferior mesenteric  artery is patent, though appears stenotic at the origin. Left colic artery patent. Superior rectal artery patent. Right lower extremity: Dense calcified  and soft plaque involving the right iliac system, including common iliac artery and external iliac artery. Small caliber vessels, with likely multifocal stenoses. Greatest degree of stenosis may be at the distal common iliac artery above the hypogastric origin. Hypogastric artery is patent. Common femoral artery patent. Proximal SFA and profunda femoris patent. Left lower extremity: Mixed calcified and soft plaque of the left iliac system. Greatest degree of stenosis at the origin of the external iliac artery. Hypogastric artery patent. Common femoral artery patent. Proximal SFA and profunda femoris patent. Veins: Unremarkable appearance of the venous system. Review of the MIP images confirms the above findings. NON-VASCULAR Lower chest: Atelectatic changes at the lung bases. Small hiatal hernia. Hepatobiliary: Morphologic changes of the liver compatible with cirrhosis with developing left hepatic enlargement. Nodular contour. No focal lesion identified on the current CT. No intrahepatic or extrahepatic biliary ductal dilatation. Cholecystectomy Pancreas: Unremarkable appearance of the pancreas. No pericholecystic fluid or inflammatory changes. Unremarkable ductal system. Spleen: Borderline splenomegaly. Adrenals/Urinary Tract: Unremarkable appearance of adrenal glands. Right: No hydronephrosis. Advanced cortical renal thinning. Symmetric perfusion to the left. Hypodense lesions within the cortex of the right kidney, the majority are too small to characterize though arm most likely benign cysts. Left: No hydronephrosis. Advanced cortical renal thinning come symmetric to the right. Symmetric perfusion. Cystic lesions in the cortex of the left kidney, most likely all benign cysts although the majority are too small to characterize. Unremarkable appearance of the  urinary bladder with high density excreted contrast. Stomach/Bowel: Unremarkable appearance of the stomach. Small hiatal hernia. Unremarkable appearance of small bowel. Venous phase demonstrates enhancement of the small bowel and colon, although the colon somewhat limited by the presence of enteric contrast. Colonic diverticula. Lymphatic: Multiple lymph nodes in the para-aortic nodal station, none of which are enlarged. Mesenteric: Low-density free fluid within the abdomen, associated with peritoneal dialysis catheter. Small volume of free air in the anti dependent aspect. Reproductive: Unremarkable appearance of the prostate and pelvic organs. Other: Small fat containing umbilical hernia. Unremarkable appearance of the tract of the dialysis catheter. Musculoskeletal: No evidence of acute fracture. Developing sclerotic appearance of the skeletal structures, potentially early renal osteodystrophy. No bony canal narrowing. No significant degenerative changes of the hips. IMPRESSION: VASCULAR No acute vascular abnormality identified. Advanced aortic atherosclerosis. There are associated changes at the origin of all mesenteric vessel, though they remain patent. There may be 50 percent stenosis at the celiac artery origin, with greater than 50 percent stenosis of the superior mesenteric artery origin, and potentially greater than 50 percent stenosis at the origin of the IMA, although degree of stenosis cannot be determined by CTA. This pattern of disease certainly places the patient at risk for chronic mesenteric ischemia. Correlation with patient's history may be useful, and potentially mesenteric duplex exam if there is concern for compromise. Bilateral renal artery stenosis, potentially contributing to advanced renal cortical thinning. NON-VASCULAR Morphologic changes of the liver compatible with cirrhosis. Referral for establishing gastroenterology care may be considered if not already established, as well as  consideration of initiating surveillance for hepatocellular carcinoma. Peritoneal dialysis catheter with free fluid likely associated with treatment. Tiny amount of anti dependent gas within the abdomen, again likely associated with the peritoneal dialysis. Again, bilateral renal cortical cysts. While these are most likely benign, there is incomplete characterization given the size. Developing renal osteodystrophy. Signed, Dulcy Fanny. Earleen [place], DO [facility] Radiology Electronically Signed   By: Corrie Mckusick D.O.   On: 04/01/2016  09:00    Dialysis Orders:  CCPD. [name] is his primary nephrologist. 6 exchanges overnight, 2529mL volume, last fill 1043mL. Dwell time 1:30, drain time 37min. Has used heparin x 3 nights for fibrin clot on 10/7. - Epogen   Assessment/Plan: 1. Abdominal pain: Unclear etiology. Pain too severe for culture neg peritonitis that is otherwise mild and improving by cell count.  Per vascular, not consistent with mesenteric ischemia. Plan per primary.  2. Peritonitis: Culture negative. Has been improving on vanco and fortaz. Cell count falling.Since he is having so much abdominal pain, will try to drain residual fluid and rest him for a bit (overnight). For abx, will plan to give IV Fortaz 1g daily and check Vancomycin level and have pharmacy re-dose this in IV formulation for now. 3.  ESRD on PD:  See above. Came to hospital mid-cycle (has fluid in abdomen). Will drain this and replace with 516mL fluid only (to prevent fibrin clots in PD cath), then rest abdomen for a bit to see if this alleviates pain in any way. Resume PD tomorrow PM.  4.  Hypertension/volume: BP controlled; euvolemic on exam. 5.  Anemia: Hgb 11.3. No ESA for now. Can start subq Aranesp.  6.  Metabolic bone disease: Ca 9.3. Continue binders once eating better. Not on VDRA; last PTH 139 on 10/7. 7.  Nutrition: Alb 2.7. Start supplements once eating  better.  Veneta Penton, PA-C 04/01/2016, 3:10 PM  [employer] Pager: [phone]  I have seen and examined this patient and agree with plan and assessment in the above note with renal recommendations/intervention highlighted.   Pt has a history of abdominal complaints over time, but current picture started about 2 weeks ago with severe N/V/abd pain that they call a "GI bug". Subsequently he developed culture neg peritonitis with cell count around 1100 that has been serially improving with IP vanco and fortaz, but overnight developed very severe abdominal pain - PD fluid is clear, cell count is down to 129, no abscess or fluid collection on 2 CT's (10/7, 10/11)  so cannot attribute his current pain, especially with the severity, to his resolving culture negative peritonitis.    CT angiogram with >50% stenosis of celiac, SMA and IMA by radiologist read raised question of mesenteric insufficiency but Dr. Trula Slade (per wife) does not feel that is cause of his pain.    Lipase was elevated on his ED visit 10/7 - no pancreatic abnormality seen on CT from [id number] or [id number] but certainly worth repeating the blood tests.    I clearly do NOT know what it is - but he is so uncomfortable at the present time that he doesn't feel he can tolerate PD tonight. Will drain current exchange, dwell low volume of PD fluid (500 ml) and give his vanco and fortaz IV for now. He will be fine to skip PD for tonight (K was low requiring replacement and volume is OK).    Deckard Stuber B,MD 04/01/2016 4:40 PM

## 2016-04-01 NOTE — ED Notes (Signed)
Spoke with Dr. Marily Memos about pt pain and new medications ordered.

## 2016-04-01 NOTE — ED Notes (Signed)
[name] reports he wants to go home if at all possible. He has appointment today at 2:30 with [facility], dr. Clover Mealy. Dr. Jeneen Rinks made aware of pt wishes.

## 2016-04-01 NOTE — ED Triage Notes (Addendum)
Patient is being treated at home with oral antibiotic for peritonitis.   He has peritoneal dialysis at home.   He was doing tx at home prior to coming to Ed.  Patient had 4/6 completed.  Patient has had increasing pain since his dx.  Patient has had one treatment of antibiotic in his peritoneal access on Monday.  He was due to go again today.  Patient took oxycodone prior to coming to ED. Patient has not been able to eat and drink for the past few days.  Patient has had decreased urine output.  Patient is noted to have dry lips.  Patient is sleepy at this time.   Patient had 3 episodes of n/v on yesterday and one today

## 2016-04-01 NOTE — ED Notes (Signed)
Pt makes little urine and hasn't urinated in two days. Pt informed if he does need to urinate to inform staff so we can get a sample.

## 2016-04-01 NOTE — ED Notes (Signed)
Dialysis RN at beside draining peritoneal fluid.

## 2016-04-01 NOTE — ED Notes (Signed)
Spoke with CN on 6E about drawing abdominal fluid from peritoneal dialysis. Reports he will be down within an hour to do procedure. Pt current has 2500 cc of indwelling fluid that was placed at 3 am. Normally has 1.5 hr dwelling time. Usually walks around with 1,000 cc.

## 2016-04-01 NOTE — ED Provider Notes (Signed)
[place] DEPT Provider Note   CSN: [id number] Arrival date & time: 04/01/16  0417     History   Chief Complaint Chief Complaint  Patient presents with  . Abdominal Pain    HPI Victor Lane is a 70 y.o. male.  HPI PT with hx of ESRD getting peritoneal HD, CAD, CHF, DM, HTN, HL comes in with cc of abd pain. [name] reports that his pain started over the weekend, and he was discharged with a diagnosis of peritonitis (with orders for peritoneal antibiotics). Patient's pain never resolved, and started getting worse tonight, so she decided to bring him to the ER. + nausea, passing flatus, constipated - having BM. No fevers, chills. Pt reports that the pain is generalized over the abdomen.   Past Medical History:  Diagnosis Date  . Aortic regurgitation   . Aortic valve sclerosis   . Arthritis    "just age related" (08/03/2014)  . Carotid artery occlusion    s/p left CEA with 40-59% restenosis and >80% stenosis of the Right ICA - followed by Dr. Kellie Simmering  . Cataracts, bilateral   . Chronic combined systolic and diastolic CHF (congestive heart failure) ([place])   . Complication of anesthesia    hard to wake up and then panics waking up  . Coronary artery disease    severe CAD with mid RCA occlusion, distal LAD stenosis, moderate disease of the left circ. Repeat cath 07/2014 for worsening LVF showed severe disease of the mid LAD, mild to mod LCX disease, occluded PL of the RCA with left to right collaterals to distal RCA territory s/p PCI of the LAD with DES.  It was noted that if patient develops CP resistant to medical therapy could consider PCI of RCA  . Diastolic dysfunction   . Dilated aortic root ([place])   . ESRD on peritoneal dialysis ([place]) 09/2013   "followed by [name]; [facility]" (08/03/2014)  . Fibromyalgia    takes tramadol daily as needed  . GERD (gastroesophageal reflux disease)   . H/O vitamin D deficiency   . Hearing loss   . History of colon polyps    . Hypercholesteremia   . Hypertension   . Myalgia and myositis   . Neuropathy ([place])   . Peripheral neuropathy ([place])   . S/P radiation therapy 01/24/2013-03/14/2013   Larynx/glottis / 63 Gy in 28 fractions  . Sleep apnea    to request from Dr.Fried was done about 85yrs ago but no cpap d/t weight loss  . Type II diabetes mellitus ([place])    "diet controlled now" (08/03/2014)  . 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 Carotid stenosis 08/21/2014  . Chronic systolic congestive heart failure ([place])   . Dyslipidemia 07/30/2014  . Aortic regurgitation   . CAD (coronary artery disease), native coronary artery 11/20/2013  . Cardiomyopathy, ischemic 11/20/2013  . Dilated aortic root ([place])   . Chronic respiratory failure ([place]) 08/29/2013  . ESRD on dialysis ([place]) 08/29/2013  . Paroxysmal atrial flutter ([place]) 08/10/2013  . Thrombocytopenia ([place]) 08/10/2013  . Leukopenia 08/10/2013  . CAPD (continuous ambulatory peritoneal dialysis) catheter in place 08/07/2013  . Severe sepsis(995.92) 08/06/2013  . Peritonitis ([place]) 08/06/2013  . HCAP (healthcare-associated pneumonia) 08/06/2013  . Diastasis recti 07/11/2013  . COPD GOLD II 06/29/2013  . Hepatitis 05/22/2013  . Leg weakness, bilateral 04/19/2013  .  Pain in limb 04/19/2013  . Neuropathy ([place]) 04/19/2013  . Lumbago 04/19/2013  . Other malaise and fatigue 04/19/2013  . Dysphagia 02/16/2013  . Protein-calorie malnutrition, severe ([place]) 02/12/2013  . Diabetes mellitus ([place]) 02/10/2013  . HTN (hypertension) 02/10/2013  . ESRD (end stage renal disease) on dialysis ([place]) 02/10/2013  . Anemia of chronic disease 02/10/2013  . Transaminitis 02/10/2013  . 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 Carotid artery stenosis 03/24/2011    Past Surgical History:  Procedure Laterality Date  . AV FISTULA PLACEMENT Left 02/23/2013    Procedure: ARTERIOVENOUS (AV) FISTULA CREATION;  Surgeon: Conrad Riverside, MD;  Location: [place];  Service: Vascular;  Laterality: Left;  . CAPD INSERTION N/A 08/01/2013   Procedure: LAPAROSCOPIC INSERTION CONTINUOUS AMBULATORY PERITONEAL DIALYSIS CATHETER;  Surgeon: [name], MD;  Location: [place];  Service: General;  Laterality: N/A;  . CARDIAC CATHETERIZATION  07/2013  . CAROTID ENDARTERECTOMY Left   . CAROTID STENT INSERTION N/A 08/21/2014   Procedure: CAROTID STENT INSERTION;  Surgeon: [name], MD;  Location: [facility] CATH LAB;  Service: Cardiovascular;  Laterality: N/A;  . CHOLECYSTECTOMY  1993  . COLONOSCOPY W/ BIOPSIES AND POLYPECTOMY     Hx: of  . CORONARY ANGIOPLASTY  08/03/2014  . LAPAROSCOPIC ABDOMINAL EXPLORATION N/A 08/01/2013   Procedure: LAPAROSCOPIC ABDOMINAL EXPLORATION;  Surgeon: [name], MD;  Location: [place];  Service: General;  Laterality: N/A;  . LEFT AND RIGHT HEART CATHETERIZATION WITH CORONARY ANGIOGRAM N/A 08/03/2014   Procedure: LEFT AND RIGHT HEART CATHETERIZATION WITH CORONARY ANGIOGRAM;  Surgeon: [name], MD;  Location: [facility] CATH LAB;  Service: Cardiovascular;  Laterality: N/A;  . LEFT HEART CATHETERIZATION WITH CORONARY ANGIOGRAM N/A 08/14/2013   Procedure: LEFT HEART CATHETERIZATION WITH CORONARY ANGIOGRAM;  Surgeon: [name], MD;  Location[facility] C[facility] LAB;  Service: Cardiovascular;  Laterality: N/A;  . MICROLARYNGOSCOPY WITH LASER N/A 01/04/2013   Procedure: MICROLARYNGOSCOPY WITH BIOPSY/LASER;  Surgeon: [name], MD;  Location: [place];  Service: ENT;  Laterality: N/A;  . SHOULDER OPEN ROTATOR CUFF REPAIR Right 07/06/2011  . TRACHEOSTOMY  01/2013  . TRACHEOSTOMY CLOSURE  02/2013  . ULNAR NERVE TRANSPOSITION Left 02/22/2014   Procedure: LEFT ULNAR NERVE RELEASE AND OR TRANSPOSITION;  Surgeon: [name], MD;  Location: [place];  Service: Orthopedics;  Laterality: Left;       Home Medications    Prior to Admission medications     Medication Sig Start Date End Date Taking? Authorizing Provider  acetaminophen (TYLENOL) 500 MG tablet Take 1,000 mg by mouth every 6 (six) hours as needed for mild pain or headache.    Yes Historical Provider, MD  ALPRAZolam Duanne Moron) 0.5 MG tablet Take 0.5 mg by mouth at bedtime.  02/05/16  Yes Historical Provider, MD  amLODipine (NORVASC) 5 MG tablet Take 5 mg by mouth every morning. 12/02/15  Yes Historical Provider, MD  aspirin 325 MG EC tablet Take 325 mg by mouth daily. 01/31/16  Yes Historical Provider, MD  calcium acetate (PHOSLO) 667 MG capsule Take 667-1,334 mg by mouth See admin instructions. Take 2 capsules (1334 mg) every morning and at lunch. Depending on the evening meal, takes 667 mg (1 tablet) or 1334 mg (2 tablets)   Yes Historical Provider, MD  chlorhexidine (PERIDEX) 0.12 % solution Use as directed 15 mLs in the mouth or throat at bedtime.  02/26/16  Yes Historical Provider, MD  DOCOSAHEXAENOIC ACID PO Take 2 g by mouth every morning.   Yes  Historical Provider, MD  gabapentin (NEURONTIN) 100 MG capsule Take 100 mg by mouth 2 (two) times daily.   Yes Historical Provider, MD  gentamicin cream (GARAMYCIN) 0.1 % Apply 1 application topically daily. 03/10/16  Yes Historical Provider, MD  glipiZIDE (GLUCOTROL XL) 2.5 MG 24 hr tablet Take 2.5 mg by mouth daily as needed (for high blood sugar).  01/27/16  Yes Historical Provider, MD  ipratropium (ATROVENT) 0.06 % nasal spray Place 2 sprays into both nostrils 2 (two) times daily.    Yes Historical Provider, MD  lisinopril (PRINIVIL,ZESTRIL) 20 MG tablet Take 20 mg by mouth at bedtime. 03/17/16  Yes Historical Provider, MD  lisinopril (PRINIVIL,ZESTRIL) 40 MG tablet Take 40 mg by mouth every morning. 01/26/16  Yes Historical Provider, MD  metoprolol succinate (TOPROL-XL) 50 MG 24 hr tablet Take 50 mg by mouth 2 (two) times daily. Take with or immediately following a meal.   Yes Historical Provider, MD  multivitamin (RENA-VIT) TABS tablet Take 1  tablet by mouth daily.   Yes Historical Provider, MD  mupirocin ointment (BACTROBAN) 2 % Apply 1 application topically 2 (two) times daily as needed (rash).    Yes Historical Provider, MD  nitroGLYCERIN (NITROSTAT) 0.4 MG SL tablet Place 1 tablet (0.4 mg total) under the tongue every 5 (five) minutes as needed for chest pain. 08/05/14  Yes Brittainy Erie Noe, PA-C  omeprazole (PRILOSEC) 20 MG capsule Take 20 mg by mouth every morning. 03/04/16  Yes Historical Provider, MD  oxyCODONE-acetaminophen (PERCOCET/ROXICET) 5-325 MG tablet Take 1 tablet by mouth every 6 (six) hours as needed for severe pain. 03/29/16  Yes Tiffany Carlota Raspberry, PA-C  rosuvastatin (CRESTOR) 5 MG tablet Take 1 tablet by mouth as directed up to once daily Patient taking differently: Take 5 mg by mouth 3 (three) times a week. Monday, Wednesday, Friday at noon 08/10/14  Yes Sueanne Margarita, MD  traMADol (ULTRAM) 50 MG tablet Take 100 mg by mouth every 4 (four) hours as needed (pain).   Yes Historical Provider, MD  triamcinolone cream (KENALOG) 0.1 % Apply 1 application topically 2 (two) times daily as needed (rash).  08/07/14  Yes Historical Provider, MD  lisinopril (PRINIVIL,ZESTRIL) 5 MG tablet TAKE 1 TABLET (5 MG TOTAL) BY MOUTH DAILY. Patient not taking: Reported on 04/01/2016 01/24/15   Liliane Shi, PA-C    Family History Family History  Problem Relation Age of Onset  . Adopted: Yes  . Family history unknown: Yes    Social History Social History  Substance Use Topics  . Smoking status: Former Smoker    Packs/day: 2.00    Years: 31.00    Types: Cigarettes    Quit date: 08/07/1995  . Smokeless tobacco: Never Used     Comment: quit smoking in 1997  . Alcohol use No     Allergies   Fentanyl; Nsaids; Amoxicillin; and Moxifloxacin   Review of Systems Review of Systems  ROS 10 Systems reviewed and are negative for acute change except as noted in the HPI.     Physical Exam Updated Vital Signs BP 163/63   Pulse  89   Temp 97.9 F (36.6 C) (Oral)   Resp 17   Ht 5\' 10"  (1.778 m)   Wt 181 lb (82.1 kg)   SpO2 96%   BMI 25.97 kg/m   Physical Exam  Constitutional: He is oriented to person, place, and time. He appears well-developed.  HENT:  Head: Normocephalic and atraumatic.  Eyes: Conjunctivae and EOM are normal. Pupils  are equal, round, and reactive to light.  Neck: Normal range of motion. Neck supple.  Cardiovascular: Normal rate, regular rhythm and normal heart sounds.   Pulmonary/Chest: Effort normal and breath sounds normal. No respiratory distress. He has no wheezes.  Abdominal: Soft. Bowel sounds are normal. He exhibits no distension. There is tenderness. There is guarding. There is no rebound.  Generalized tenderness, with guarding over the LLQ and RUQ  Neurological: He is alert and oriented to person, place, and time.  Skin: Skin is warm.  Nursing note and vitals reviewed.    ED Treatments / Results  Labs (all labs ordered are listed, but only abnormal results are displayed) Labs Reviewed  CBC WITH DIFFERENTIAL/PLATELET - Abnormal; Notable for the following:       Result Value   RBC 3.35 (*)    Hemoglobin 11.3 (*)    HCT 35.7 (*)    MCV 106.6 (*)    RDW 17.5 (*)    Neutro Abs 7.9 (*)    Eosinophils Absolute 0.8 (*)    All other components within normal limits  I-STAT CG4 LACTIC ACID, ED - Abnormal; Notable for the following:    Lactic Acid, Venous 3.22 (*)    All other components within normal limits  I-STAT VENOUS BLOOD GAS, ED - Abnormal; Notable for the following:    pH, Ven 7.431 (*)    pO2, Ven 28.0 (*)    Bicarbonate 30.4 (*)    Acid-Base Excess 5.0 (*)    All other components within normal limits  CULTURE, BLOOD (ROUTINE X 2)  CULTURE, BLOOD (ROUTINE X 2)  URINE CULTURE  COMPREHENSIVE METABOLIC PANEL  URINALYSIS, ROUTINE W REFLEX MICROSCOPIC (NOT AT [facility])  BLOOD GAS, VENOUS    EKG  EKG Interpretation None       Radiology Dg Chest 2 View  Result  Date: 04/01/2016 CLINICAL DATA:  Suspected sepsis.  Peritoneal dialysis patient. EXAM: CHEST  2 VIEW COMPARISON:  Chest radiograph 07/19/2015.  Abdominal CT 03/28/2016 FINDINGS: Stable cardiomegaly and mediastinal contours. There is thoracic aortic atherosclerosis. Small bilateral pleural effusions and bibasilar airspace disease. No pulmonary edema. No pneumothorax. Vascular stent in the left axilla. Free air under the right hemidiaphragm, likely related to peritoneal dialysis. Resorption of the distal right clavicle. IMPRESSION: Chronic cardiomegaly, stable.  Thoracic aortic atherosclerosis. Small bilateral pleural effusions with bibasilar airspace disease, likely atelectasis. Electronically Signed   By: Jeb Levering M.D.   On: 04/01/2016 05:40    Procedures Procedures (including critical care time)  Medications Ordered in ED Medications  iopamidol (ISOVUE-370) 76 % injection (not administered)  dextrose 5 % solution ( Intravenous New Bag/Given 04/01/16 0607)     Initial Impression / Assessment and Plan / ED Course  I have reviewed the triage vital signs and the nursing notes.  Pertinent labs & imaging results that were available during my care of the patient were reviewed by me and considered in my medical decision making (see chart for details).  Clinical Course   Pt comes in with abd pain. Generalized abd pain. Pt being treated as peritonitis at home. On exam - pt looks uncomfortable. He has guarding over the abdomen- LLQ. Recent CT was neg for acute process. Pt's had ascites drained and the results did show > 1000 WBCs, and thus he was sent home with antibiotics. Despite the meds, pain worsening.  Ascites cultures are negative  Clinical concern for alternate etiology mesenteric ischemia. Pt will likely need admission for pain control anyways. We  will get CT-angio. Pt's CT abd had shown pretty good clot burden. Lactate is elevated, but it is not related to  Sepsis based on the  information at hand at this time.   Final Clinical Impressions(s) / ED Diagnoses   Final diagnoses:  Generalized abdominal pain  ESRD (end stage renal disease) on dialysis [facility])    New Prescriptions New Prescriptions   No medications on file     Varney Biles, MD 04/01/16 [phone]

## 2016-04-01 NOTE — Progress Notes (Signed)
Pharmacy Antibiotic Note  Victor Lane is a 70 y.o. male admitted on 04/01/2016 with intra-abdominal infxn.  Pharmacy has been consulted for zosyn dosing. Pt is afebrile and WBC is is WNL. Pt with history of ESRD on PD. Lactic acid has improved to 1.64.   Plan: - Zosyn 2.25gm IV Q12H - F/u renal plans, C&S, clinical status  Height: 5\' 10"  (177.8 cm) Weight: 181 lb (82.1 kg) IBW/kg (Calculated) : 73  Temp (24hrs), Avg:97.8 F (36.6 C), Min:97.6 F (36.4 C), Max:97.9 F (36.6 C)   Recent Labs Lab 03/28/16 1942 04/01/16 0507 04/01/16 0515 04/01/16 0844  WBC 12.5* 10.4  --   --   CREATININE 9.59* 8.45*  --   --   LATICACIDVEN  --   --  3.22* 1.64    Estimated Creatinine Clearance: 8.4 mL/min (by C-G formula based on SCr of 8.45 mg/dL (H)).    Allergies  Allergen Reactions  . Fentanyl Other (See Comments)    Confusion when patch applied  . Nsaids Other (See Comments)    Severe kidney disease/ESRD  . Amoxicillin Diarrhea and Nausea And Vomiting  . Moxifloxacin Diarrhea and Nausea And Vomiting    Antimicrobials this admission: Zosyn 10/11>>  Dose adjustments this admission: N/A  Microbiology results: Pending  Thank you for allowing pharmacy to be a part of this patient's care.  [name], [name] 04/01/2016 11:50 AM

## 2016-04-01 NOTE — ED Notes (Signed)
Patient transported to X-ray 

## 2016-04-01 NOTE — ED Notes (Signed)
Patient transported to CT 

## 2016-04-01 NOTE — ED Notes (Signed)
Potassium paused in order to give dilaudid. Will resume after dilaudid administered.

## 2016-04-01 NOTE — ED Notes (Addendum)
Dr. Kathrynn Humble notified on pt.'s low potassium result.

## 2016-04-01 NOTE — ED Notes (Signed)
5W called and states patient placement called and informed them pt will be receiving going to SDU.

## 2016-04-01 NOTE — H&P (Signed)
History and Physical    Victor Lane [id number] DOB: March 30, 1946 DOA: 04/01/2016   PCP: [name], MD   Patient coming from:  Home   Chief Complaint: ABdominal pain    HPI: Victor Lane is a 70 y.o. male with  extensive medical history listed below including ESRD on peritoneal dialysis on nightly basis, last one on 10/10, under the care of Dr. Gretchen Short, seeing over the weekend for abdominal pain, which had started last Friday, at which time having been discharge with a diagnosis of peritonitis, with orders for peritoneal antibiotics. The pain did not resolve, for which he presented to the emergency department for further evaluation. He also reported nausea, and constipation. He is passing status. He denies any fever or chills. He reports the pain being generalized over the abdomen, in prove when he is abdominal fluid increases, worse when peritoneal dialysis takes place. The pain is worse in the lower abdominal portion. It is intermittent, and knife like. He denies any fever or chills. He denies any respiratory or cardiac complaints. He denies any lower extremity swelling. The patient feels generalized weakness due to all the symptoms, unable to eat over the last few days.  ED Course:  BP 129/57   Pulse 84   Temp 97.9 F (36.6 C) (Oral)   Resp 12   Ht 5\' 10"  (1.778 m)   Wt 82.1 kg (181 lb)   SpO2 91%   BMI 25.97 kg/m    pH 7.4, by carb 32 lactic acid was initially 3.22, currently at 1.64 his peritoneal fluid today is unremarkable for bacteria his last culture of the peritoneal fluid returned negative on 04/01/2016. CT angiography abdomen without acute vascular abnormality, however that is greater than 50% stenosis at the origin of the mesenteric artery without apparent thrombus  Of note, cirrhosis was noted in the CT angio, and free fluid associated with the peritoneal dialysis, but patient has a noted history of hepatitis , non infectious  Chest x-ray with chronic  cardiomegaly, mild pleural effusion  white count 10.2, hemoglobin 11.3,platelets  390  Last 2 D echo 09/2014  EF 30-35 % Ischemic cardiomyopathy, decreased  Function   Review of Systems: As per HPI otherwise 10 point review of systems negative.   Past Medical History:  Diagnosis Date  . Aortic regurgitation   . Aortic valve sclerosis   . Arthritis    "just age related" (08/03/2014)  . Carotid artery occlusion    s/p left CEA with 40-59% restenosis and >80% stenosis of the Right ICA - followed by Dr. Kellie Simmering  . Cataracts, bilateral   . Chronic combined systolic and diastolic CHF (congestive heart failure) ([place])   . Complication of anesthesia    hard to wake up and then panics waking up  . Coronary artery disease    severe CAD with mid RCA occlusion, distal LAD stenosis, moderate disease of the left circ. Repeat cath 07/2014 for worsening LVF showed severe disease of the mid LAD, mild to mod LCX disease, occluded PL of the RCA with left to right collaterals to distal RCA territory s/p PCI of the LAD with DES.  It was noted that if patient develops CP resistant to medical therapy could consider PCI of RCA  . Diastolic dysfunction   . Dilated aortic root ([place])   . ESRD on peritoneal dialysis ([place]) 09/2013   "followed by [name]; [facility]" (08/03/2014)  . Fibromyalgia    takes tramadol daily as needed  . GERD (  gastroesophageal reflux disease)   . H/O vitamin D deficiency   . Hearing loss   . History of colon polyps   . Hypercholesteremia   . Hypertension   . Myalgia and myositis   . Neuropathy ([place])   . Peripheral neuropathy ([place])   . S/P radiation therapy 01/24/2013-03/14/2013   Larynx/glottis / 63 Gy in 28 fractions  . Sleep apnea    to request from Dr.Fried was done about 56yrs ago but no cpap d/t weight loss  . Type II diabetes mellitus ([place])    "diet controlled now" (08/03/2014)  . 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 AV FISTULA PLACEMENT Left 02/23/2013   Procedure: ARTERIOVENOUS (AV) FISTULA CREATION;  Surgeon: Conrad Reynolds, MD;  Location: [place];  Service: Vascular;  Laterality: Left;  . CAPD INSERTION N/A 08/01/2013   Procedure: LAPAROSCOPIC INSERTION CONTINUOUS AMBULATORY PERITONEAL DIALYSIS CATHETER;  Surgeon: [name], MD;  Location: [place];  Service: General;  Laterality: N/A;  . CARDIAC CATHETERIZATION  07/2013  . CAROTID ENDARTERECTOMY Left   . CAROTID STENT INSERTION N/A 08/21/2014   Procedure: CAROTID STENT INSERTION;  Surgeon: [name], MD;  Location: [facility] CATH LAB;  Service: Cardiovascular;  Laterality: N/A;  . CHOLECYSTECTOMY  1993  . COLONOSCOPY W/ BIOPSIES AND POLYPECTOMY     Hx: of  . CORONARY ANGIOPLASTY  08/03/2014  . LAPAROSCOPIC ABDOMINAL EXPLORATION N/A 08/01/2013   Procedure: LAPAROSCOPIC ABDOMINAL EXPLORATION;  Surgeon: [name], MD;  Location: [place];  Service: General;  Laterality: N/A;  . LEFT AND RIGHT HEART CATHETERIZATION WITH CORONARY ANGIOGRAM N/A 08/03/2014   Procedure: LEFT AND RIGHT HEART CATHETERIZATION WITH CORONARY ANGIOGRAM;  Surgeon: [name], MD;  Location[facility] C[facility] LAB;  Service: Cardiovascular;  Laterality: N/A;  . LEFT HEART CATHETERIZATION WITH CORONARY ANGIOGRAM N/A 08/14/2013   Procedure: LEFT HEART CATHETERIZATION WITH CORONARY ANGIOGRAM;  Surgeon: [name], MD;  Location[facility];  Service: Cardiovascular;  Laterality: N/A;  . MICROLARYNGOSCOPY WITH LASER N/A 01/04/2013   Procedure: MICROLARYNGOSCOPY WITH BIOPSY/LASER;  Surgeon: [name], MD;  Location: [place];  Service: ENT;  Laterality: N/A;  . SHOULDER OPEN ROTATOR CUFF REPAIR Right 07/06/2011  . TRACHEOSTOMY  01/2013  . TRACHEOSTOMY CLOSURE  02/2013  . ULNAR NERVE TRANSPOSITION Left 02/22/2014   Procedure: LEFT ULNAR NERVE RELEASE AND OR TRANSPOSITION;  Surgeon: [name], MD;  Location: [place];   Service: Orthopedics;  Laterality: Left;    Social History Social History   Social History  . Marital status: Married    Spouse name: Fraser Din  . Number of children: 1  . Years of education: HS   Occupational History  .      Self-Employed   Social History Main Topics  . Smoking status: Former Smoker    Packs/day: 2.00    Years: 31.00    Types: Cigarettes    Quit date: 08/07/1995  . Smokeless tobacco: Never Used     Comment: quit smoking in 1997  . Alcohol use No  . Drug use: No  . Sexual activity: Yes   Other Topics Concern  . Not on file   Social History Narrative   Patient lives at home with spouse.   Caffiene Use: 1 cup daily     Allergies  Allergen Reactions  . Fentanyl Other (See Comments)  Confusion when patch applied  . Nsaids Other (See Comments)    Severe kidney disease/ESRD  . Amoxicillin Diarrhea and Nausea And Vomiting  . Moxifloxacin Diarrhea and Nausea And Vomiting    Family History  Problem Relation Age of Onset  . Adopted: Yes  . Family history unknown: Yes      Prior to Admission medications   Medication Sig Start Date End Date Taking? Authorizing Provider  acetaminophen (TYLENOL) 500 MG tablet Take 1,000 mg by mouth every 6 (six) hours as needed for mild pain or headache.    Yes Historical Provider, MD  ALPRAZolam Duanne Moron) 0.5 MG tablet Take 0.5 mg by mouth at bedtime.  02/05/16  Yes Historical Provider, MD  amLODipine (NORVASC) 5 MG tablet Take 5 mg by mouth every morning. 12/02/15  Yes Historical Provider, MD  aspirin 325 MG EC tablet Take 325 mg by mouth daily. 01/31/16  Yes Historical Provider, MD  calcium acetate (PHOSLO) 667 MG capsule Take 667-1,334 mg by mouth See admin instructions. Take 2 capsules (1334 mg) every morning and at lunch. Depending on the evening meal, takes 667 mg (1 tablet) or 1334 mg (2 tablets)   Yes Historical Provider, MD  chlorhexidine (PERIDEX) 0.12 % solution Use as directed 15 mLs in the mouth or throat at  bedtime.  02/26/16  Yes Historical Provider, MD  DOCOSAHEXAENOIC ACID PO Take 2 g by mouth every morning.   Yes Historical Provider, MD  gabapentin (NEURONTIN) 100 MG capsule Take 100 mg by mouth 2 (two) times daily.   Yes Historical Provider, MD  gentamicin cream (GARAMYCIN) 0.1 % Apply 1 application topically daily. 03/10/16  Yes Historical Provider, MD  glipiZIDE (GLUCOTROL XL) 2.5 MG 24 hr tablet Take 2.5 mg by mouth daily as needed (for high blood sugar).  01/27/16  Yes Historical Provider, MD  ipratropium (ATROVENT) 0.06 % nasal spray Place 2 sprays into both nostrils 2 (two) times daily.    Yes Historical Provider, MD  lisinopril (PRINIVIL,ZESTRIL) 20 MG tablet Take 20 mg by mouth at bedtime. 03/17/16  Yes Historical Provider, MD  lisinopril (PRINIVIL,ZESTRIL) 40 MG tablet Take 40 mg by mouth every morning. 01/26/16  Yes Historical Provider, MD  metoprolol succinate (TOPROL-XL) 50 MG 24 hr tablet Take 50 mg by mouth 2 (two) times daily. Take with or immediately following a meal.   Yes Historical Provider, MD  multivitamin (RENA-VIT) TABS tablet Take 1 tablet by mouth daily.   Yes Historical Provider, MD  mupirocin ointment (BACTROBAN) 2 % Apply 1 application topically 2 (two) times daily as needed (rash).    Yes Historical Provider, MD  nitroGLYCERIN (NITROSTAT) 0.4 MG SL tablet Place 1 tablet (0.4 mg total) under the tongue every 5 (five) minutes as needed for chest pain. 08/05/14  Yes Brittainy Erie Noe, PA-C  omeprazole (PRILOSEC) 20 MG capsule Take 20 mg by mouth every morning. 03/04/16  Yes Historical Provider, MD  oxyCODONE-acetaminophen (PERCOCET/ROXICET) 5-325 MG tablet Take 1 tablet by mouth every 6 (six) hours as needed for severe pain. 03/29/16  Yes Tiffany Carlota Raspberry, PA-C  rosuvastatin (CRESTOR) 5 MG tablet Take 1 tablet by mouth as directed up to once daily Patient taking differently: Take 5 mg by mouth 3 (three) times a week. Monday, Wednesday, Friday at noon 08/10/14  Yes Sueanne Margarita, MD    traMADol (ULTRAM) 50 MG tablet Take 100 mg by mouth every 4 (four) hours as needed (pain).   Yes Historical Provider, MD  triamcinolone cream (KENALOG) 0.1 % Apply  1 application topically 2 (two) times daily as needed (rash).  08/07/14  Yes Historical Provider, MD  lisinopril (PRINIVIL,ZESTRIL) 5 MG tablet TAKE 1 TABLET (5 MG TOTAL) BY MOUTH DAILY. Patient not taking: Reported on 04/01/2016 01/24/15   Liliane Shi, PA-C    Physical Exam:    Vitals:   04/01/16 0930 04/01/16 1000 04/01/16 1030 04/01/16 1100  BP: 164/58 146/59 157/58 129/57  Pulse: 84 92 85 84  Resp: 16 20 16 12   Temp:      TempSrc:      SpO2: 95% 98% 96% 91%  Weight:      Height:           Constitutional: NAD, lethargic due to Dilaudid, but able to open eyes and respond to sounds and touch  Vitals:   04/01/16 0930 04/01/16 1000 04/01/16 1030 04/01/16 1100  BP: 164/58 146/59 157/58 129/57  Pulse: 84 92 85 84  Resp: 16 20 16 12   Temp:      TempSrc:      SpO2: 95% 98% 96% 91%  Weight:      Height:       Eyes: PERRL, lids and conjunctivae normal ENMT: Mucous membranes are moist. Posterior pharynx clear of any exudate or lesions.Normal dentition.  Neck: normal, supple, no masses, no thyromegaly Respiratory: clear to auscultation bilaterally, no wheezing, no crackles. Normal respiratory effort. No accessory muscle use.  Cardiovascular: Regular rate and rhythm, no murmurs / rubs / gallops. No extremity edema. 2+ pedal pulses. No carotid bruits.  Abdomen: distended,  R> L, tender to palpation in the lower quadrants, decreased bowel sounds, peritoneal dialysis cath in place  Musculoskeletal: no clubbing / cyanosis. No joint deformity upper and lower extremities. Good ROM, no contractures. Normal muscle tone.  Skin: no rashes, lesions, ulcers. Well healed former  tracheostomy site  Neurologic: CN 2-12 grossly intact. Sensation intact, DTR normal. Strength 5/5 in all 4.     Labs on Admission: I have personally  reviewed following labs and imaging studies  CBC:  Recent Labs Lab 03/28/16 1942 04/01/16 0507  WBC 12.5* 10.4  NEUTROABS  --  7.9*  HGB 10.8* 11.3*  HCT 34.5* 35.7*  MCV 107.5* 106.6*  PLT 314 [id number]    Basic Metabolic Panel:  Recent Labs Lab 03/28/16 1942 04/01/16 0507  NA 133* 134*  K 2.9* 2.5*  CL 88* 90*  CO2 28 26  GLUCOSE 98 164*  BUN 43* 25*  CREATININE 9.59* 8.45*  CALCIUM 9.0 9.3    GFR: Estimated Creatinine Clearance: 8.4 mL/min (by C-G formula based on SCr of 8.45 mg/dL (H)).  Liver Function Tests:  Recent Labs Lab 03/28/16 1942 04/01/16 0507  AST 17 23  ALT 23 24  ALKPHOS 100 107  BILITOT 0.2* 0.6  PROT 6.6 6.7  ALBUMIN 2.5* 2.5*    Recent Labs Lab 03/28/16 1942  LIPASE 102*   No results for input(s): AMMONIA in the last 168 hours.  Coagulation Profile: No results for input(s): INR, PROTIME in the last 168 hours.  Cardiac Enzymes: No results for input(s): CKTOTAL, CKMB, CKMBINDEX, TROPONINI in the last 168 hours.  BNP (last 3 results) No results for input(s): PROBNP in the last 8760 hours.  HbA1C: No results for input(s): HGBA1C in the last 72 hours.  CBG: No results for input(s): GLUCAP in the last 168 hours.  Lipid Profile: No results for input(s): CHOL, HDL, LDLCALC, TRIG, CHOLHDL, LDLDIRECT in the last 72 hours.  Thyroid Function Tests: No results  for input(s): TSH, T4TOTAL, FREET4, T3FREE, THYROIDAB in the last 72 hours.  Anemia Panel: No results for input(s): VITAMINB12, FOLATE, FERRITIN, TIBC, IRON, RETICCTPCT in the last 72 hours.  Urine analysis:    Component Value Date/Time   COLORURINE YELLOW 08/07/2013 1855   APPEARANCEUR CLOUDY (A) 08/07/2013 1855   LABSPEC 1.020 08/07/2013 1855   PHURINE 7.5 08/07/2013 1855   GLUCOSEU 100 (A) 08/07/2013 1855   HGBUR MODERATE (A) 08/07/2013 1855   BILIRUBINUR SMALL (A) 08/07/2013 1855   KETONESUR NEGATIVE 08/07/2013 1855   PROTEINUR >300 (A) 08/07/2013 1855    UROBILINOGEN 1.0 08/07/2013 1855   NITRITE NEGATIVE 08/07/2013 1855   LEUKOCYTESUR TRACE (A) 08/07/2013 1855    Sepsis Labs: @LABRCNTIP (procalcitonin:4,lacticidven:4) ) Recent Results (from the past 240 hour(s))  Gram stain     Status: None   Collection Time: 03/29/16  1:12 AM  Result Value Ref Range Status   Specimen Description FLUID  Final   Special Requests PERITONEAL  Final   Gram Stain   Final    CYTOSPIN SMEAR WBC PRESENT,BOTH PMN AND MONONUCLEAR NO ORGANISMS SEEN    Report Status 03/29/2016 FINAL  Final  Culture, body fluid-bottle     Status: None (Preliminary result)   Collection Time: 03/29/16  1:12 AM  Result Value Ref Range Status   Specimen Description FLUID  Final   Special Requests PERITONEAL BOTTLES DRAWN AEROBIC AND ANAEROBIC  Final   Culture NO GROWTH 2 DAYS  Final   Report Status PENDING  Incomplete  Culture, blood (Routine x 2)     Status: None (Preliminary result)   Collection Time: 04/01/16  5:50 AM  Result Value Ref Range Status   Specimen Description BLOOD RIGHT HAND  Final   Special Requests AEROBIC BOTTLE ONLY 5ML  Final   Culture PENDING  Incomplete   Report Status PENDING  Incomplete  Gram stain     Status: None   Collection Time: 04/01/16  8:12 AM  Result Value Ref Range Status   Specimen Description PERITONEAL  Final   Special Requests NONE  Final   Gram Stain   Final    FEW WBC PRESENT, PREDOMINANTLY PMN NO ORGANISMS SEEN    Report Status 04/01/2016 FINAL  Final     Radiological Exams on Admission: Dg Chest 2 View  Result Date: 04/01/2016 CLINICAL DATA:  Suspected sepsis.  Peritoneal dialysis patient. EXAM: CHEST  2 VIEW COMPARISON:  Chest radiograph 07/19/2015.  Abdominal CT 03/28/2016 FINDINGS: Stable cardiomegaly and mediastinal contours. There is thoracic aortic atherosclerosis. Small bilateral pleural effusions and bibasilar airspace disease. No pulmonary edema. No pneumothorax. Vascular stent in the left axilla. Free air under the  right hemidiaphragm, likely related to peritoneal dialysis. Resorption of the distal right clavicle. IMPRESSION: Chronic cardiomegaly, stable.  Thoracic aortic atherosclerosis. Small bilateral pleural effusions with bibasilar airspace disease, likely atelectasis. Electronically Signed   By: Jeb Levering M.D.   On: 04/01/2016 05:40   Ct Angio Abd/pel W And/or Wo Contrast  Result Date: 04/01/2016 CLINICAL DATA:  70 year old male with a history of lower abdominal pain and nausea vomiting. Constipation. EXAM: CTA ABDOMEN AND PELVIS WITH CONTRAST TECHNIQUE: Multidetector CT imaging of the abdomen and pelvis was performed using the standard protocol during bolus administration of intravenous contrast. Multiplanar reconstructed images and MIPs were obtained and reviewed to evaluate the vascular anatomy. CONTRAST:  75 cc Isovue 370 COMPARISON:  CT 03/28/2016, 11/26/2014, 08/06/2013 FINDINGS: VASCULAR Aorta: Diffuse atherosclerosis of the abdominal aorta with soft plaque and calcified plaque.  No dissection. No aneurysm. No periaortic fluid. Celiac: Atherosclerotic changes at the origin of the celiac artery which is patent. Post stenotic dilation. Typical branch pattern of the celiac artery, with left gastric, common hepatic, and splenic artery identified. SMA: Atherosclerotic changes of the superior mesenteric artery, with mixed calcified and soft plaque at the origin. The SMA appears patent, with greater than 50% stenosis. Renals: Dense calcified and soft plaque at the origin the bilateral renal arteries. Renal arteries are patent. Single left and single right renal artery. IMA: Inferior mesenteric artery is patent, though appears stenotic at the origin. Left colic artery patent. Superior rectal artery patent. Right lower extremity: Dense calcified and soft plaque involving the right iliac system, including common iliac artery and external iliac artery. Small caliber vessels, with likely multifocal stenoses.  Greatest degree of stenosis may be at the distal common iliac artery above the hypogastric origin. Hypogastric artery is patent. Common femoral artery patent. Proximal SFA and profunda femoris patent. Left lower extremity: Mixed calcified and soft plaque of the left iliac system. Greatest degree of stenosis at the origin of the external iliac artery. Hypogastric artery patent. Common femoral artery patent. Proximal SFA and profunda femoris patent. Veins: Unremarkable appearance of the venous system. Review of the MIP images confirms the above findings. NON-VASCULAR Lower chest: Atelectatic changes at the lung bases. Small hiatal hernia. Hepatobiliary: Morphologic changes of the liver compatible with cirrhosis with developing left hepatic enlargement. Nodular contour. No focal lesion identified on the current CT. No intrahepatic or extrahepatic biliary ductal dilatation. Cholecystectomy Pancreas: Unremarkable appearance of the pancreas. No pericholecystic fluid or inflammatory changes. Unremarkable ductal system. Spleen: Borderline splenomegaly. Adrenals/Urinary Tract: Unremarkable appearance of adrenal glands. Right: No hydronephrosis. Advanced cortical renal thinning. Symmetric perfusion to the left. Hypodense lesions within the cortex of the right kidney, the majority are too small to characterize though arm most likely benign cysts. Left: No hydronephrosis. Advanced cortical renal thinning come symmetric to the right. Symmetric perfusion. Cystic lesions in the cortex of the left kidney, most likely all benign cysts although the majority are too small to characterize. Unremarkable appearance of the urinary bladder with high density excreted contrast. Stomach/Bowel: Unremarkable appearance of the stomach. Small hiatal hernia. Unremarkable appearance of small bowel. Venous phase demonstrates enhancement of the small bowel and colon, although the colon somewhat limited by the presence of enteric contrast. Colonic  diverticula. Lymphatic: Multiple lymph nodes in the para-aortic nodal station, none of which are enlarged. Mesenteric: Low-density free fluid within the abdomen, associated with peritoneal dialysis catheter. Small volume of free air in the anti dependent aspect. Reproductive: Unremarkable appearance of the prostate and pelvic organs. Other: Small fat containing umbilical hernia. Unremarkable appearance of the tract of the dialysis catheter. Musculoskeletal: No evidence of acute fracture. Developing sclerotic appearance of the skeletal structures, potentially early renal osteodystrophy. No bony canal narrowing. No significant degenerative changes of the hips. IMPRESSION: VASCULAR No acute vascular abnormality identified. Advanced aortic atherosclerosis. There are associated changes at the origin of all mesenteric vessel, though they remain patent. There may be 50 percent stenosis at the celiac artery origin, with greater than 50 percent stenosis of the superior mesenteric artery origin, and potentially greater than 50 percent stenosis at the origin of the IMA, although degree of stenosis cannot be determined by CTA. This pattern of disease certainly places the patient at risk for chronic mesenteric ischemia. Correlation with patient's history may be useful, and potentially mesenteric duplex exam if there is concern for compromise.  Bilateral renal artery stenosis, potentially contributing to advanced renal cortical thinning. NON-VASCULAR Morphologic changes of the liver compatible with cirrhosis. Referral for establishing gastroenterology care may be considered if not already established, as well as consideration of initiating surveillance for hepatocellular carcinoma. Peritoneal dialysis catheter with free fluid likely associated with treatment. Tiny amount of anti dependent gas within the abdomen, again likely associated with the peritoneal dialysis. Again, bilateral renal cortical cysts. While these are most likely  benign, there is incomplete characterization given the size. Developing renal osteodystrophy. Signed, Dulcy Fanny. Earleen Newport, DO [facility] Radiology Electronically Signed   By: Corrie Mckusick D.O.   On: 04/01/2016 09:00    EKG: Independently reviewed.  Assessment/Plan Active Problems:   Glottis carcinoma (HCC)   Diabetes mellitus ([place])   HTN (hypertension)   ESRD (end stage renal disease) on dialysis (HCC)   Anemia of chronic disease   Neuropathy (HCC)   Continuous ambulatory peritoneal dialysis status ([place])   ESRD on dialysis ([place])   Dyslipidemia   Abdominal pain  Abdominal pain likey due to mesenteric ischemia, Pain initially felt to be due to peritonitis, but Cx neg.. CT angio abdomen 50 % stenosis mesenteric artery without thrombus, suspect ischemia Afebrile, VSS  BP , O sats 91 RA white count 10.2  . LActic acid 1.62  Admit to SDU NPO  Pain control with IV Dilaudid  Zosyn IV Trend Lactic acid Hold IVF until eval with Nephrology  Vascular Surgery consultation , will defer anticoagulation to them  culture  ESRD on HD Peritoneal Cr at  Baseline 8.45 . K 2.5 Primary Nephrologist Dr. Clover Mealy  Nephrology involved, Dr. Lorrene Reid notified. NPO for now , holding IVF  Will defer further metabolite treatment to Nephrology  Check CMET in am  Chronic systolic CHF  Last 2 D echo 09/2014  EF 30-35 % Ischemic cardiomyopathy, decreased function . Weight 82 kg Not hypoxic, no WOB  -monitor I/Os -daily weights -prn 02 Close monitoring   Type II Diabetes with neuropathy  Current blood sugar level is 164 Lab Results  Component Value Date   HGBA1C 6.3 (H) 08/11/2013  Hgb A1C SSI NPO Resume neurontin in am   Hypertension BP 146/59   Pulse 92  Controlled Continue home anti-hypertensive medications in am  Hydralazine prn if BP 160/90    History of Glottis Carcinoma stage 1  not on active treatment/ Anemia of chronic disease Hemoglobin  on  admission 11.3. . No bleeding issues reported  Repeat CBC in am Can follow up with Dr. Alen Blew as OP      DVT prophylaxis: SCD's  Code Status:   Full    Family Communication:  Discussed with patient  Disposition Plan: Expect patient to be discharged to home after condition improves Consults called:    Nephrology                                 Vascular Surgery  Admission status:  SDU    [facility] E, PA-C [facility]   If 7PM-7AM, please contact night-coverage [email or web address] Password [email or web address]  04/01/2016, 12:22 PM

## 2016-04-01 NOTE — ED Notes (Signed)
Attempted report to 5W. 

## 2016-04-01 NOTE — Consult Note (Signed)
[facility] NOTE   MRN : [id number]  Reason for Consult: SMA stenosis  Referring Physician: ED  History of Present Illness: 70 y/o male who came to the ED this morning secondary to sever abdominal pain that started Sat.  He was seen by his primary care and diagnosed with peritonitis.  He was given IV antibiotics after his home peritoneal dialysis. He does have a working left brachiocephalic arteriovenous fistula that is not being used.   He has been on PD since 2014.  His abdominal pain has progressed.  He is a  patient of [name] who is status post right ICA angioplasty and stent on 08/21/2014, and left carotid endarterectomy on 04/01/2011.  Past medical history includes: Coronary artery stent 08/03/2014 placed on Brilinta until 10/01/2015 now on daily 81 mg ASA.  HTN managed with Lisinopril and Metoprolol, Hyperlipidemia managed with Crestor.        Current Facility-Administered Medications  Medication Dose Route Frequency Provider Last Rate Last Dose  . acetaminophen (TYLENOL) tablet 650 mg  650 mg Oral Q6H PRN Rondel Jumbo, PA-C       Or  . acetaminophen (TYLENOL) suppository 650 mg  650 mg Rectal Q6H PRN Rondel Jumbo, PA-C      . Derrill Memo ON 04/02/2016] amLODipine (NORVASC) tablet 5 mg  5 mg Oral BH-q7a Coralee Pesa Wertman, PA-C      . bisacodyl (DULCOLAX) suppository 10 mg  10 mg Rectal Daily PRN Rondel Jumbo, PA-C      . calcium acetate [facility]) capsule [id number] mg  [id number] mg Oral See admin instructions Rondel Jumbo, PA-C      . chlorhexidine (PERIDEX) 0.12 % solution 15 mL  15 mL Mouth/Throat QHS Rondel Jumbo, PA-C      . Derrill Memo ON 04/02/2016] gabapentin (NEURONTIN) capsule 100 mg  100 mg Oral BID Rondel Jumbo, PA-C      . hydrALAZINE (APRESOLINE) injection 5-10 mg  5-10 mg Intravenous Q8H PRN Rondel Jumbo, PA-C      . HYDROcodone-acetaminophen (NORCO/VICODIN) 5-325 MG per tablet 1-2 tablet  1-2 tablet Oral Q4H PRN Rondel Jumbo, PA-C       . HYDROmorphone (DILAUDID) injection 0.5 mg  0.5 mg Intravenous Q1H PRN Tanna Furry, MD   0.5 mg at 04/01/16 1032  . insulin aspart (novoLOG) injection 0-9 Units  0-9 Units Subcutaneous TID WC Rondel Jumbo, PA-C      . ipratropium (ATROVENT) 0.06 % nasal spray 2 spray  2 spray Each Nare BID Rondel Jumbo, PA-C      . Derrill Memo ON 04/02/2016] lisinopril (PRINIVIL,ZESTRIL) tablet 20 mg  20 mg Oral QHS Rondel Jumbo, PA-C      . Derrill Memo ON 04/02/2016] lisinopril (PRINIVIL,ZESTRIL) tablet 40 mg  40 mg Oral BH-q7a Sara E [place], PA-C      . magnesium citrate solution 1 Bottle  1 Bottle Oral Once PRN Rondel Jumbo, PA-C      . Derrill Memo ON 04/02/2016] metoprolol succinate (TOPROL-XL) 24 hr tablet 50 mg  50 mg Oral BID Rondel Jumbo, PA-C      . Derrill Memo ON 04/02/2016] multivitamin (RENA-VIT) tablet 1 tablet  1 tablet Oral Daily Rondel Jumbo, PA-C      . ondansetron [facility]) tablet 4 mg  4 mg Oral Q6H PRN Rondel Jumbo, PA-C       Or  . ondansetron (ZOFRAN) injection 4 mg  4 mg Intravenous Q6H PRN  Rondel Jumbo, PA-C      . Derrill Memo ON 04/02/2016] pantoprazole (PROTONIX) EC tablet 40 mg  40 mg Oral Daily Rondel Jumbo, PA-C      . piperacillin-tazobactam (ZOSYN) IVPB 2.25 g  2.25 g Intravenous Q12H Rachel L Rumbarger, RPH      . [START ON 04/02/2016] rosuvastatin (CRESTOR) tablet 5 mg  5 mg Oral Once per day on Mon Wed Fri Sara E Wertman, PA-C      . senna-docusate (Senokot-S) tablet 1 tablet  1 tablet Oral QHS PRN Rondel Jumbo, PA-C      . sodium chloride flush (NS) 0.9 % injection 3 mL  3 mL Intravenous Q12H Rondel Jumbo, PA-C      . traZODone (DESYREL) tablet 25 mg  25 mg Oral QHS PRN Rondel Jumbo, PA-C       Current Outpatient Prescriptions  Medication Sig Dispense Refill  . acetaminophen (TYLENOL) 500 MG tablet Take 1,000 mg by mouth every 6 (six) hours as needed for mild pain or headache.     . ALPRAZolam (XANAX) 0.5 MG tablet Take 0.5 mg by mouth at bedtime.     Marland Kitchen amLODipine  (NORVASC) 5 MG tablet Take 5 mg by mouth every morning.    Marland Kitchen aspirin 325 MG EC tablet Take 325 mg by mouth daily.    . calcium acetate (PHOSLO) 667 MG capsule Take 667-1,334 mg by mouth See admin instructions. Take 2 capsules (1334 mg) every morning and at lunch. Depending on the evening meal, takes 667 mg (1 tablet) or 1334 mg (2 tablets)    . chlorhexidine (PERIDEX) 0.12 % solution Use as directed 15 mLs in the mouth or throat at bedtime.     . DOCOSAHEXAENOIC ACID PO Take 2 g by mouth every morning.    . gabapentin (NEURONTIN) 100 MG capsule Take 100 mg by mouth 2 (two) times daily.    Marland Kitchen gentamicin cream (GARAMYCIN) 0.1 % Apply 1 application topically daily.    Marland Kitchen glipiZIDE (GLUCOTROL XL) 2.5 MG 24 hr tablet Take 2.5 mg by mouth daily as needed (for high blood sugar).   0  . ipratropium (ATROVENT) 0.06 % nasal spray Place 2 sprays into both nostrils 2 (two) times daily.     Marland Kitchen lisinopril (PRINIVIL,ZESTRIL) 20 MG tablet Take 20 mg by mouth at bedtime.    Marland Kitchen lisinopril (PRINIVIL,ZESTRIL) 40 MG tablet Take 40 mg by mouth every morning.    . metoprolol succinate (TOPROL-XL) 50 MG 24 hr tablet Take 50 mg by mouth 2 (two) times daily. Take with or immediately following a meal.    . multivitamin (RENA-VIT) TABS tablet Take 1 tablet by mouth daily.    . mupirocin ointment (BACTROBAN) 2 % Apply 1 application topically 2 (two) times daily as needed (rash).     . nitroGLYCERIN (NITROSTAT) 0.4 MG SL tablet Place 1 tablet (0.4 mg total) under the tongue every 5 (five) minutes as needed for chest pain. 25 tablet 2  . omeprazole (PRILOSEC) 20 MG capsule Take 20 mg by mouth every morning.    Marland Kitchen oxyCODONE-acetaminophen (PERCOCET/ROXICET) 5-325 MG tablet Take 1 tablet by mouth every 6 (six) hours as needed for severe pain. 10 tablet 0  . rosuvastatin (CRESTOR) 5 MG tablet Take 1 tablet by mouth as directed up to once daily (Patient taking differently: Take 5 mg by mouth 3 (three) times a week. Monday, Wednesday,  Friday at noon) 90 tablet 3  . traMADol (ULTRAM) 50 MG tablet  Take 100 mg by mouth every 4 (four) hours as needed (pain).    . triamcinolone cream (KENALOG) 0.1 % Apply 1 application topically 2 (two) times daily as needed (rash).     Marland Kitchen lisinopril (PRINIVIL,ZESTRIL) 5 MG tablet TAKE 1 TABLET (5 MG TOTAL) BY MOUTH DAILY. (Patient not taking: Reported on 04/01/2016) 30 tablet 6    Pt meds include: Statin :Yes Betablocker: Yes ASA: Yes Other anticoagulants/antiplatelets: ASA  Past Medical History:  Diagnosis Date  . Aortic regurgitation   . Aortic valve sclerosis   . Arthritis    "just age related" (08/03/2014)  . Carotid artery occlusion    s/p left CEA with 40-59% restenosis and >80% stenosis of the Right ICA - followed by Dr. Kellie Simmering  . Cataracts, bilateral   . Chronic combined systolic and diastolic CHF (congestive heart failure) ([place])   . Complication of anesthesia    hard to wake up and then panics waking up  . Coronary artery disease    severe CAD with mid RCA occlusion, distal LAD stenosis, moderate disease of the left circ. Repeat cath 07/2014 for worsening LVF showed severe disease of the mid LAD, mild to mod LCX disease, occluded PL of the RCA with left to right collaterals to distal RCA territory s/p PCI of the LAD with DES.  It was noted that if patient develops CP resistant to medical therapy could consider PCI of RCA  . Diastolic dysfunction   . Dilated aortic root ([place])   . ESRD on peritoneal dialysis ([place]) 09/2013   "followed by [name]; [facility]" (08/03/2014)  . Fibromyalgia    takes tramadol daily as needed  . GERD (gastroesophageal reflux disease)   . H/O vitamin D deficiency   . Hearing loss   . History of colon polyps   . Hypercholesteremia   . Hypertension   . Myalgia and myositis   . Neuropathy ([place])   . Peripheral neuropathy ([place])   . S/P radiation therapy 01/24/2013-03/14/2013   Larynx/glottis / 63 Gy in 28 fractions  . Sleep apnea    to  request from Dr.Fried was done about 1yrs ago but no cpap d/t weight loss  . Type II diabetes mellitus ([place])    "diet controlled now" (08/03/2014)  . 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 AV FISTULA PLACEMENT Left 02/23/2013   Procedure: ARTERIOVENOUS (AV) FISTULA CREATION;  Surgeon: Conrad [place], MD;  Location: [place];  Service: Vascular;  Laterality: Left;  . CAPD INSERTION N/A 08/01/2013   Procedure: LAPAROSCOPIC INSERTION CONTINUOUS AMBULATORY PERITONEAL DIALYSIS CATHETER;  Surgeon: [name], MD;  Location: [place];  Service: General;  Laterality: N/A;  . CARDIAC CATHETERIZATION  07/2013  . CAROTID ENDARTERECTOMY Left   . CAROTID STENT INSERTION N/A 08/21/2014   Procedure: CAROTID STENT INSERTION;  Surgeon: [name], MD;  Location: [facility] CATH LAB;  Service: Cardiovascular;  Laterality: N/A;  . CHOLECYSTECTOMY  1993  . COLONOSCOPY W/ BIOPSIES AND POLYPECTOMY     Hx: of  . CORONARY ANGIOPLASTY  08/03/2014  . LAPAROSCOPIC ABDOMINAL EXPLORATION N/A 08/01/2013   Procedure: LAPAROSCOPIC ABDOMINAL EXPLORATION;  Surgeon: [name], MD;  Location: [place];  Service: General;  Laterality: N/A;  . LEFT AND RIGHT HEART CATHETERIZATION WITH CORONARY ANGIOGRAM N/A 08/03/2014   Procedure: LEFT AND RIGHT HEART CATHETERIZATION WITH CORONARY ANGIOGRAM;  Surgeon: Conception Oms  Hassell Done, MD;  Location: [facility] CATH LAB;  Service: Cardiovascular;  Laterality: N/A;  . LEFT HEART CATHETERIZATION WITH CORONARY ANGIOGRAM N/A 08/14/2013   Procedure: LEFT HEART CATHETERIZATION WITH CORONARY ANGIOGRAM;  Surgeon: [name], MD;  Location: [facility] C[facility] LAB;  Service: Cardiovascular;  Laterality: N/A;  . MICROLARYNGOSCOPY WITH LASER N/A 01/04/2013   Procedure: MICROLARYNGOSCOPY WITH BIOPSY/LASER;  Surgeon: [name], MD;  Location: [place];  Service: ENT;  Laterality: N/A;  . SHOULDER OPEN ROTATOR  CUFF REPAIR Right 07/06/2011  . TRACHEOSTOMY  01/2013  . TRACHEOSTOMY CLOSURE  02/2013  . ULNAR NERVE TRANSPOSITION Left 02/22/2014   Procedure: LEFT ULNAR NERVE RELEASE AND OR TRANSPOSITION;  Surgeon: [name], MD;  Location: [place];  Service: Orthopedics;  Laterality: Left;    Social History Social History  Substance Use Topics  . Smoking status: Former Smoker    Packs/day: 2.00    Years: 31.00    Types: Cigarettes    Quit date: 08/07/1995  . Smokeless tobacco: Never Used     Comment: quit smoking in 1997  . Alcohol use No    Family History Family History  Problem Relation Age of Onset  . Adopted: Yes  . Family history unknown: Yes    Allergies  Allergen Reactions  . Fentanyl Other (See Comments)    Confusion when patch applied  . Nsaids Other (See Comments)    Severe kidney disease/ESRD  . Amoxicillin Diarrhea and Nausea And Vomiting  . Moxifloxacin Diarrhea and Nausea And Vomiting     REVIEW OF SYSTEMS  General: [ ]  Weight loss, [ ]  Fever, [ ]  chills Neurologic: [ ]  Dizziness, [ ]  Blackouts, [ ]  Seizure [ ]  Stroke, [ ]  "Mini stroke", [ ]  Slurred speech, [ ]  Temporary blindness; [ ]  weakness in arms or legs, [ ]  Hoarseness [ ]  Dysphagia Cardiac: [ ]  Chest pain/pressure, [ ]  Shortness of breath at rest [ ]  Shortness of breath with exertion, [ ]  Atrial fibrillation or irregular heartbeat  Vascular: [ ]  Pain in legs with walking, [ ]  Pain in legs at rest, [ ]  Pain in legs at night,  [ ]  Non-healing ulcer, [ ]  Blood clot in vein/DVT,   Pulmonary: [ ]  Home oxygen, [ ]  Productive cough, [ ]  Coughing up blood, [ ]  Asthma,  [ ]  Wheezing [ ]  COPD Musculoskeletal:  [ ]  Arthritis, [ ]  Low back pain, [ ]  Joint pain Hematologic: [x ] Easy Bruising, [ ]  Anemia; [ ]  Hepatitis Gastrointestinal: [ ]  Blood in stool, [ ]  Gastroesophageal Reflux/heartburn, Urinary: [x ] chronic Kidney disease, [x ] on HD - [ ]  MWF or [ ]  TTHS, [ ]  Burning with urination, [ ]  Difficulty  urinating Skin: [ ]  Rashes, [ ]  Wounds Psychological: [ ]  Anxiety, [ ]  Depression  Physical Examination Vitals:   04/01/16 0930 04/01/16 1000 04/01/16 1030 04/01/16 1100  BP: 164/58 146/59 157/58 129/57  Pulse: 84 92 85 84  Resp: 16 20 16 12   Temp:      TempSrc:      SpO2: 95% 98% 96% 91%  Weight:      Height:       Body mass index is 25.97 kg/m.  General:  WDWN in NAD Gait: Normal, forward flexed dur to pain HENT: WNL Eyes: Pupils equal Pulmonary: normal non-labored breathing , without Rales, rhonchi,  wheezing Cardiac: RRR, without  Murmurs, rubs or gallops; No carotid bruits Abdomen: soft, lower quadrants tender, positive BS Skin: no rashes,  ulcers noted;  no Gangrene , no cellulitis; no open wounds;   Vascular Exam/Pulses:radial pulses palpable, non palpable distal pedal pulses.  Feet are warm without ulcers.   Musculoskeletal: no muscle wasting or atrophy; no edema  Neurologic: A&O X 3; Appropriate Affect ;  SENSATION: normal; MOTOR FUNCTION: 5/5 Symmetric Speech is fluent/normal   Significant Diagnostic Studies: CBC Lab Results  Component Value Date   WBC 10.4 04/01/2016   HGB 11.3 (L) 04/01/2016   HCT 35.7 (L) 04/01/2016   MCV 106.6 (H) 04/01/2016   PLT 390 04/01/2016    BMET    Component Value Date/Time   NA 134 (L) 04/01/2016 0507   NA 141 03/09/2013 1520   K 2.5 (LL) 04/01/2016 0507   K 4.8 03/09/2013 1520   CL 90 (L) 04/01/2016 0507   CO2 26 04/01/2016 0507   CO2 30 (H) 03/09/2013 1520   GLUCOSE 164 (H) 04/01/2016 0507   GLUCOSE 110 03/09/2013 1520   BUN 25 (H) 04/01/2016 0507   BUN 26.9 (H) 03/09/2013 1520   CREATININE 8.45 (H) 04/01/2016 0507   CREATININE 5.8 Repeated and Verified (HH) 03/09/2013 1520   CALCIUM 9.3 04/01/2016 0507   CALCIUM 8.8 03/09/2013 1520   GFRNONAA 6 (L) 04/01/2016 0507   GFRAA 7 (L) 04/01/2016 0507   Estimated Creatinine Clearance: 8.4 mL/min (by C-G formula based on SCr of 8.45 mg/dL (H)).  COAG Lab  Results  Component Value Date   INR 1.01 08/21/2014   INR 1.07 08/03/2014   INR 0.96 08/14/2013     Non-Invasive Vascular Imaging:  CTA 04/01/2016 IMPRESSION: VASCULAR  No acute vascular abnormality identified.  Advanced aortic atherosclerosis. There are associated changes at the origin of all mesenteric vessel, though they remain patent. There may be 50 percent stenosis at the celiac artery origin, with greater than 50 percent stenosis of the superior mesenteric artery origin, and potentially greater than 50 percent stenosis at the origin of the IMA, although degree of stenosis cannot be determined by CTA. This pattern of disease certainly places the patient at risk for chronic mesenteric ischemia. Correlation with patient's history may be useful, and potentially mesenteric duplex exam if there is concern for compromise.  ASSESSMENT/PLAN:  Abdominal pain unrelenting He has positive BS and arterial flow per CTA to his bowels.  I do not feel that this is contributing to his pain.  I will discuss these findings with Dr. Fae Pippin, EMMA [facility] 04/01/2016 12:14 PM   I have seen and evaluated the patient. He has a 2 week history of abdominal pain which has gotten worse over the past several days.  He denies fevers.  He has had diarrhea recently which has been negative for gross blood.  He is being treated for peritonitis, but his fluid today looks pretty clear.  He is also having nausea and vomiting.  His lactate is elevated.  He had a CT scan which shows stenosis of the mesenteric vessels, however, the celiac,SMA, and IMA are all patent.  Per his wife he has had weight loss only since his episode began.  He denies post-prandial pain.  On exam, his abdomen is very tender to palpation.  At this time, I do not suspect acute mesenteric ischemia.  There was no evidence of intestinal ischemia on his CT scan (no pneumatosis, no free air, no bowel wall thickening).  I suspect  this is a GI illness.  I would treat him for gastroenteritis.  If he does not improve  with abx, bowel rest, GI cocktail,  and hydration, I would consider angiography to better evaluate and possibly treat his mesenteric stenosis.  I would repeat pancreatic enzymes as these have been elevated in the past.   Annamarie Major

## 2016-04-01 NOTE — ED Provider Notes (Addendum)
Care discussed with Dr. Kathrynn Humble. CTA shows 50% or greater stenosis of all 3 vessels on CT angiogram. He is not acidotic. He continues to have significant pain. I discussed the case with internal medicine resident team regarding admission. I will notify his nephrologist and his admission. Fluid is been obtained for studies.   Tanna Furry, MD 04/01/16 [place], MD 04/01/16 [place], MD 04/01/16 1630

## 2016-04-01 NOTE — ED Notes (Signed)
Attempted report to 4E. 

## 2016-04-01 NOTE — Progress Notes (Addendum)
Pharmacy Antibiotic Note  Victor Lane is a 70 y.o. male admitted on 04/01/2016 with peritonitis.  Pharmacy has been consulted for ceftazidime and vancomycin dosing.  Started on abx on 10/7 for peritonitis. Had received a couple doses of intraperitoneal dosing of vancomycin. Vancomycin random today is elevated at 31. Renal consulted and will hold PD due to abdominal pain. Plan is to resume PD tomorrow night.  Plan: Hold IV vancomycin Continue ceftazidime 1g IV Q24 Monitor clinical picture, PD plans F/U LOT   Height: 5\' 10"  (177.8 cm) Weight: 181 lb (82.1 kg) IBW/kg (Calculated) : 73  Temp (24hrs), Avg:97.8 F (36.6 C), Min:97.6 F (36.4 C), Max:97.9 F (36.6 C)   Recent Labs Lab 03/28/16 1942 04/01/16 0507 04/01/16 0515 04/01/16 0844 04/01/16 1306 04/01/16 1532 04/01/16 1609  WBC 12.5* 10.4  --   --   --   --   --   CREATININE 9.59* 8.45*  --   --   --   --   --   LATICACIDVEN  --   --  3.22* 1.64 1.6 2.9*  --   VANCORANDOM  --   --   --   --   --   --  31    Estimated Creatinine Clearance: 8.4 mL/min (by C-G formula based on SCr of 8.45 mg/dL (H)).    Allergies  Allergen Reactions  . Fentanyl Other (See Comments)    Confusion when patch applied  . Nsaids Other (See Comments)    Severe kidney disease/ESRD  . Amoxicillin Diarrhea and Nausea And Vomiting  . Moxifloxacin Diarrhea and Nausea And Vomiting    Antimicrobials this admission: IP Vancomycin 10/7 >> 10/11 Vancomycin 10/11 >> Ceftazidime 10/11 >>   Dose adjustments this admission: 10/11 VR = 31  Microbiology results: 10/11 Peritoneal fluid Cx: ngtd 10/11 UCx: sent  10/11 BCx: ngtd  Thank you for allowing pharmacy to be a part of this patient's care.  Reginia Naas 04/01/2016 5:30 PM

## 2016-04-02 ENCOUNTER — Inpatient Hospital Stay (HOSPITAL_COMMUNITY): Payer: Medicare Other

## 2016-04-02 DIAGNOSIS — T8571XD Infection and inflammatory reaction due to peritoneal dialysis catheter, subsequent encounter: Secondary | ICD-10-CM

## 2016-04-02 DIAGNOSIS — E785 Hyperlipidemia, unspecified: Secondary | ICD-10-CM

## 2016-04-02 DIAGNOSIS — N186 End stage renal disease: Secondary | ICD-10-CM

## 2016-04-02 DIAGNOSIS — E118 Type 2 diabetes mellitus with unspecified complications: Secondary | ICD-10-CM

## 2016-04-02 DIAGNOSIS — Z992 Dependence on renal dialysis: Secondary | ICD-10-CM

## 2016-04-02 LAB — HEMOGLOBIN A1C
HEMOGLOBIN A1C: 5.5 % (ref 4.8–5.6)
MEAN PLASMA GLUCOSE: 111 mg/dL

## 2016-04-02 LAB — GLUCOSE, CAPILLARY
GLUCOSE-CAPILLARY: 126 mg/dL — AB (ref 65–99)
GLUCOSE-CAPILLARY: 99 mg/dL (ref 65–99)
GLUCOSE-CAPILLARY: 149 mg/dL — AB (ref 65–99)
GLUCOSE-CAPILLARY: 125 mg/dL — AB (ref 65–99)

## 2016-04-02 LAB — COMPREHENSIVE METABOLIC PANEL
ALBUMIN: 2.1 g/dL — AB (ref 3.5–5.0)
ALT: 22 U/L (ref 17–63)
AST: 23 U/L (ref 15–41)
Alkaline Phosphatase: 96 U/L (ref 38–126)
Anion gap: 15 (ref 5–15)
BUN: 33 mg/dL — AB (ref 6–20)
CHLORIDE: 93 mmol/L — AB (ref 101–111)
CO2: 24 mmol/L (ref 22–32)
Calcium: 8.6 mg/dL — ABNORMAL LOW (ref 8.9–10.3)
Creatinine, Ser: 9.96 mg/dL — ABNORMAL HIGH (ref 0.61–1.24)
GFR calc Af Amer: 5 mL/min — ABNORMAL LOW (ref >60)
GFR calc non Af Amer: 5 mL/min — ABNORMAL LOW (ref >60)
GLUCOSE: 111 mg/dL — AB (ref 65–99)
POTASSIUM: 3.2 mmol/L — AB (ref 3.5–5.1)
SODIUM: 132 mmol/L — AB (ref 135–145)
Total Bilirubin: 0.4 mg/dL (ref 0.3–1.2)
Total Protein: 5.6 g/dL — ABNORMAL LOW (ref 6.5–8.1)

## 2016-04-02 LAB — CBC
HEMATOCRIT: 34.2 % — AB (ref 39.0–52.0)
Hemoglobin: 10.7 g/dL — ABNORMAL LOW (ref 13.0–17.0)
MCH: 33.1 pg (ref 26.0–34.0)
MCHC: 31.3 g/dL (ref 30.0–36.0)
MCV: 105.9 fL — AB (ref 78.0–100.0)
Platelets: 302 10*3/uL (ref 150–400)
RBC: 3.23 MIL/uL — ABNORMAL LOW (ref 4.22–5.81)
RDW: 17.2 % — AB (ref 11.5–15.5)
WBC: 14.5 10*3/uL — ABNORMAL HIGH (ref 4.0–10.5)

## 2016-04-02 LAB — LACTIC ACID, PLASMA: LACTIC ACID, VENOUS: 1.2 mmol/L (ref 0.5–1.9)

## 2016-04-02 LAB — PROTIME-INR
INR: 1.23
Prothrombin Time: 15.6 seconds — ABNORMAL HIGH (ref 11.4–15.2)

## 2016-04-02 LAB — LIPASE, BLOOD: Lipase: 21 U/L (ref 11–51)

## 2016-04-02 LAB — AMYLASE: Amylase: 82 U/L (ref 28–100)

## 2016-04-02 MED ORDER — HYDROMORPHONE HCL 1 MG/ML IJ SOLN
0.5000 mg | Freq: Once | INTRAMUSCULAR | Status: AC
  Administered 2016-04-02: 0.5 mg via INTRAVENOUS

## 2016-04-02 MED ORDER — BISACODYL 10 MG RE SUPP
10.0000 mg | Freq: Once | RECTAL | Status: AC
  Administered 2016-04-02: 10 mg via RECTAL
  Filled 2016-04-02: qty 1

## 2016-04-02 MED ORDER — HYDROMORPHONE HCL 1 MG/ML IJ SOLN
INTRAMUSCULAR | Status: AC
  Administered 2016-04-02: 1 mg via INTRAVENOUS
  Filled 2016-04-02: qty 1

## 2016-04-02 MED ORDER — DELFLEX-LC/1.5% DEXTROSE 346 MOSM/L IP SOLN
INTRAPERITONEAL | Status: DC
  Administered 2016-04-02: 3000 mL via INTRAPERITONEAL

## 2016-04-02 MED ORDER — HYDROMORPHONE HCL 1 MG/ML IJ SOLN
1.0000 mg | INTRAMUSCULAR | Status: DC | PRN
  Administered 2016-04-02 – 2016-04-04 (×14): 1 mg via INTRAVENOUS
  Filled 2016-04-02 (×15): qty 1

## 2016-04-02 MED ORDER — IOPAMIDOL (ISOVUE-370) INJECTION 76%
INTRAVENOUS | Status: AC
  Administered 2016-04-02: 100 mL
  Filled 2016-04-02: qty 100

## 2016-04-02 MED ORDER — LORAZEPAM 2 MG/ML IJ SOLN
0.5000 mg | Freq: Once | INTRAMUSCULAR | Status: AC
  Administered 2016-04-02: 0.5 mg via INTRAVENOUS
  Filled 2016-04-02: qty 1

## 2016-04-02 MED ORDER — GENTAMICIN SULFATE 0.1 % EX CREA
1.0000 "application " | TOPICAL_CREAM | Freq: Every day | CUTANEOUS | Status: DC
  Administered 2016-04-02 – 2016-04-04 (×3): 1 via TOPICAL
  Filled 2016-04-02: qty 15

## 2016-04-02 MED ORDER — HEPARIN 1000 UNIT/ML FOR PERITONEAL DIALYSIS
2500.0000 [IU] | INTRAMUSCULAR | Status: DC | PRN
  Administered 2016-04-03: 2500 [IU] via INTRAPERITONEAL
  Filled 2016-04-02 (×7): qty 2.5

## 2016-04-02 MED ORDER — POTASSIUM CHLORIDE 20 MEQ/15ML (10%) PO SOLN
40.0000 meq | Freq: Two times a day (BID) | ORAL | Status: AC
  Administered 2016-04-02 (×2): 40 meq via ORAL
  Filled 2016-04-02 (×2): qty 30

## 2016-04-02 MED ORDER — HYDROMORPHONE HCL 1 MG/ML IJ SOLN
1.0000 mg | Freq: Once | INTRAMUSCULAR | Status: AC
  Administered 2016-04-02: 1 mg via INTRAVENOUS

## 2016-04-02 MED ORDER — HYDROMORPHONE HCL 1 MG/ML IJ SOLN
1.0000 mg | Freq: Once | INTRAMUSCULAR | Status: DC
  Filled 2016-04-02: qty 1

## 2016-04-02 MED ORDER — HYDROMORPHONE HCL 1 MG/ML IJ SOLN
0.5000 mg | INTRAMUSCULAR | Status: DC | PRN
  Administered 2016-04-02 (×2): 0.5 mg via INTRAVENOUS
  Filled 2016-04-02 (×2): qty 1

## 2016-04-02 MED ORDER — DELFLEX-LC/1.5% DEXTROSE 346 MOSM/L IP SOLN
INTRAPERITONEAL | Status: DC
  Administered 2016-04-02 (×3): 5000 mL via INTRAPERITONEAL
  Administered 2016-04-03: 3000 mL via INTRAPERITONEAL

## 2016-04-02 MED ORDER — POLYETHYLENE GLYCOL 3350 17 G PO PACK
17.0000 g | PACK | Freq: Every day | ORAL | Status: DC
  Administered 2016-04-02 – 2016-04-04 (×3): 17 g via ORAL
  Filled 2016-04-02 (×3): qty 1

## 2016-04-02 NOTE — Progress Notes (Signed)
Pt waking up trying to get out of bed. Explained to patient that he was in the hospital but pt still attempting to try and get out of bed. Pt showing signs of pain, grimaced face, guarding abdomen, general restlessness. Asked about pain to which pt answer yes but upon trying to give PO oxycodone pt spits out of mouth and refuses to take. TRH paged and situation explained, K Schorr writes for 1mg  of dilaudid to be given once. Pt given IV med and is resting comfortably

## 2016-04-02 NOTE — Plan of Care (Signed)
Problem: Education: Goal: Knowledge of [facility] Education information/materials will improve Outcome: Not Progressing Pt is confused at times and unable to accurately describe pain and becomes anxious and agitated

## 2016-04-02 NOTE — Progress Notes (Signed)
[facility] Rounding Note Subjective/Interval History:   Held PD last night Dislikes the hospital - wants to go home where can rest Getting mostly IV dilaudid for pain (spit out a dose of po oxycodone last PM and refused to take) Abd still with diffuse pain  Dr. Trula Slade is repeating the CT angiogram today   Objective Vital signs in last 24 hours: Vitals:   04/02/16 0300 04/02/16 0445 04/02/16 0600 04/02/16 0800  BP: (!) 110/55 109/64 (!) 113/49 (!) 131/54  Pulse: 95 94 96 95  Resp: 15 13 16 15   Temp:  97.7 F (36.5 C)  97.9 F (36.6 C)  TempSrc:  Oral  Oral  SpO2: 97% 98% 100% (!) 89%  Weight:      Height:       Weight change: -0.794 kg (-1 lb 12 oz)  Intake/Output Summary (Last 24 hours) at 04/02/16 1225 Last data filed at 04/02/16 0045  Gross per 24 hour  Intake              600 ml  Output             1600 ml  Net            -1000 ml   Physical Exam:  Blood pressure (!) 131/54, pulse 95, temperature 97.9 F (36.6 C), temperature source Oral, resp. rate 15, height 5\' 10"  (1.778 m), weight 81.4 kg (179 lb 8 oz), SpO2 (!) 89 %.  Well developed, well nourished.  Somewhat groggy from pain meds VS as noted Less restless Lungs clear Regular rhythm S1S2 no S3 1/6 murmur LSB Abdomen quiet. Few BS.Not distended but with moderate diffuse tenderness to palpation  PD cath appears ok. Exit site and tunnel fine.  No LE edema. Dialysis Access: PD cath in mid L abdomen.  Labs: Basic Metabolic Panel:  Recent Labs Lab 03/28/16 1942 04/01/16 0507 04/02/16 0217  NA 133* 134* 132*  K 2.9* 2.5* 3.2*  CL 88* 90* 93*  CO2 28 26 24   GLUCOSE 98 164* 111*  BUN 43* 25* 33*  CREATININE 9.59* 8.45* 9.96*  CALCIUM 9.0 9.3 8.6*     Recent Labs Lab 03/28/16 1942 04/01/16 0507 04/02/16 0217  AST 17 23 23   ALT 23 24 22   ALKPHOS 100 107 96  BILITOT 0.2* 0.6 0.4  PROT 6.6 6.7 5.6*  ALBUMIN 2.5* 2.5* 2.1*    Recent Labs Lab 03/28/16 1942 04/02/16 0814  LIPASE 102* 21  AMYLASE   --  82   CBC:  Recent Labs Lab 03/28/16 1942 04/01/16 0507 04/02/16 0217  WBC 12.5* 10.4 14.5*  NEUTROABS  --  7.9*  --   HGB 10.8* 11.3* 10.7*  HCT 34.5* 35.7* 34.2*  MCV 107.5* 106.6* 105.9*  PLT 314 390 302    Recent Labs Lab 04/01/16 1516 04/01/16 2112 04/02/16 0743 04/02/16 1144  GLUCAP 128* 93 126* 99   Results for Victor, Lane (MRN [id number]) as of 04/01/2016 16:42  03/29/2016 01:12 04/01/2016 08:12  Monocyte-Macrophage-Serous Fluid 18 (L) 6 (L)  Other Cells, Fluid  FEW PYKNOTIC NEUT...  Albumin, Fluid  <1.0  Fluid Type-FALB  PERITONEAL CAVITY  Glucose, Peritoneal Fluid  516  Fluid Type-FLDH  PERITONEAL CAVITY  LD, Fluid  41 (H)  Total protein, fluid  <3.0  Fluid Type-FCT PERITONEAL Peritoneal  Color, Fluid STRAW (A) COLORLESS (A)  WBC, Fluid 1,196 (H) 129  Lymphs, Fluid 5 1  Eos, Fluid 43 37  Appearance, Fluid HAZY (A) CLEAR  Neutrophil Count,  Fluid 34 (H) 56 (H)    Studies/Results: Dg Chest 2 View  Result Date: 04/01/2016 CLINICAL DATA:  Suspected sepsis.  Peritoneal dialysis patient. EXAM: CHEST  2 VIEW COMPARISON:  Chest radiograph 07/19/2015.  Abdominal CT 03/28/2016 FINDINGS: Stable cardiomegaly and mediastinal contours. There is thoracic aortic atherosclerosis. Small bilateral pleural effusions and bibasilar airspace disease. No pulmonary edema. No pneumothorax. Vascular stent in the left axilla. Free air under the right hemidiaphragm, likely related to peritoneal dialysis. Resorption of the distal right clavicle. IMPRESSION: Chronic cardiomegaly, stable.  Thoracic aortic atherosclerosis. Small bilateral pleural effusions with bibasilar airspace disease, likely atelectasis. Electronically Signed   By: Jeb Levering M.D.   On: 04/01/2016 05:40   Ct Angio Abd/pel W And/or Wo Contrast  Result Date: 04/01/2016 CLINICAL DATA:  70 year old male with a history of lower abdominal pain and nausea vomiting. Constipation. EXAM: CTA ABDOMEN AND  PELVIS WITH CONTRAST TECHNIQUE: Multidetector CT imaging of the abdomen and pelvis was performed using the standard protocol during bolus administration of intravenous contrast. Multiplanar reconstructed images and MIPs were obtained and reviewed to evaluate the vascular anatomy. CONTRAST:  75 cc Isovue 370 COMPARISON:  CT 03/28/2016, 11/26/2014, 08/06/2013 FINDINGS: VASCULAR Aorta: Diffuse atherosclerosis of the abdominal aorta with soft plaque and calcified plaque. No dissection. No aneurysm. No periaortic fluid. Celiac: Atherosclerotic changes at the origin of the celiac artery which is patent. Post stenotic dilation. Typical branch pattern of the celiac artery, with left gastric, common hepatic, and splenic artery identified. SMA: Atherosclerotic changes of the superior mesenteric artery, with mixed calcified and soft plaque at the origin. The SMA appears patent, with greater than 50% stenosis. Renals: Dense calcified and soft plaque at the origin the bilateral renal arteries. Renal arteries are patent. Single left and single right renal artery. IMA: Inferior mesenteric artery is patent, though appears stenotic at the origin. Left colic artery patent. Superior rectal artery patent. Right lower extremity: Dense calcified and soft plaque involving the right iliac system, including common iliac artery and external iliac artery. Small caliber vessels, with likely multifocal stenoses. Greatest degree of stenosis may be at the distal common iliac artery above the hypogastric origin. Hypogastric artery is patent. Common femoral artery patent. Proximal SFA and profunda femoris patent. Left lower extremity: Mixed calcified and soft plaque of the left iliac system. Greatest degree of stenosis at the origin of the external iliac artery. Hypogastric artery patent. Common femoral artery patent. Proximal SFA and profunda femoris patent. Veins: Unremarkable appearance of the venous system. Review of the MIP images confirms the  above findings. NON-VASCULAR Lower chest: Atelectatic changes at the lung bases. Small hiatal hernia. Hepatobiliary: Morphologic changes of the liver compatible with cirrhosis with developing left hepatic enlargement. Nodular contour. No focal lesion identified on the current CT. No intrahepatic or extrahepatic biliary ductal dilatation. Cholecystectomy Pancreas: Unremarkable appearance of the pancreas. No pericholecystic fluid or inflammatory changes. Unremarkable ductal system. Spleen: Borderline splenomegaly. Adrenals/Urinary Tract: Unremarkable appearance of adrenal glands. Right: No hydronephrosis. Advanced cortical renal thinning. Symmetric perfusion to the left. Hypodense lesions within the cortex of the right kidney, the majority are too small to characterize though arm most likely benign cysts. Left: No hydronephrosis. Advanced cortical renal thinning come symmetric to the right. Symmetric perfusion. Cystic lesions in the cortex of the left kidney, most likely all benign cysts although the majority are too small to characterize. Unremarkable appearance of the urinary bladder with high density excreted contrast. Stomach/Bowel: Unremarkable appearance of the stomach. Small hiatal hernia. Unremarkable  appearance of small bowel. Venous phase demonstrates enhancement of the small bowel and colon, although the colon somewhat limited by the presence of enteric contrast. Colonic diverticula. Lymphatic: Multiple lymph nodes in the para-aortic nodal station, none of which are enlarged. Mesenteric: Low-density free fluid within the abdomen, associated with peritoneal dialysis catheter. Small volume of free air in the anti dependent aspect. Reproductive: Unremarkable appearance of the prostate and pelvic organs. Other: Small fat containing umbilical hernia. Unremarkable appearance of the tract of the dialysis catheter. Musculoskeletal: No evidence of acute fracture. Developing sclerotic appearance of the skeletal  structures, potentially early renal osteodystrophy. No bony canal narrowing. No significant degenerative changes of the hips. IMPRESSION: VASCULAR No acute vascular abnormality identified. Advanced aortic atherosclerosis. There are associated changes at the origin of all mesenteric vessel, though they remain patent. There may be 50 percent stenosis at the celiac artery origin, with greater than 50 percent stenosis of the superior mesenteric artery origin, and potentially greater than 50 percent stenosis at the origin of the IMA, although degree of stenosis cannot be determined by CTA. This pattern of disease certainly places the patient at risk for chronic mesenteric ischemia. Correlation with patient's history may be useful, and potentially mesenteric duplex exam if there is concern for compromise. Bilateral renal artery stenosis, potentially contributing to advanced renal cortical thinning. NON-VASCULAR Morphologic changes of the liver compatible with cirrhosis. Referral for establishing gastroenterology care may be considered if not already established, as well as consideration of initiating surveillance for hepatocellular carcinoma. Peritoneal dialysis catheter with free fluid likely associated with treatment. Tiny amount of anti dependent gas within the abdomen, again likely associated with the peritoneal dialysis. Again, bilateral renal cortical cysts. While these are most likely benign, there is incomplete characterization given the size. Developing renal osteodystrophy. Signed, Dulcy Fanny. Earleen Newport, DO [facility] Electronically Signed   By: Corrie Mckusick D.O.   On: 04/01/2016 09:00   Medications:   . calcium acetate  1,334 mg Oral BID WC  . calcium acetate  667 mg Oral Q supper  . cefTAZidime (FORTAZ)  IV  1 g Intravenous Q24H  . chlorhexidine  15 mL Mouth/Throat QHS  . gabapentin  100 mg Oral BID  . insulin aspart  0-9 Units Subcutaneous TID WC   . ipratropium  2 spray Each Nare BID  . lisinopril  20 mg Oral QHS  . metoprolol succinate  50 mg Oral BID  . multivitamin  1 tablet Oral Daily  . pantoprazole  40 mg Oral Daily  . rosuvastatin  5 mg Oral Once per day on Mon Wed Fri  . sodium chloride flush  3 mL Intravenous Q12H   CCPD. [name] is his primary nephrologist. 6 exchanges overnight, 2518mL volume, last fill 1092mL. Dwell time 1:30, drain time 70min. Has used heparin x 3 nights for fibrin clot on 10/7. - Epogen   Assessment/Recommendations  1. Abdominal pain: Unclear etiology. Current picture started about 2 weeks ago with severe N/V/abd pain that they call a "GI bug". Subsequently he developed culture neg peritonitis with cell count around 1100 that has been serially improving with IP vanco and fortaz, but overnight developed very severe abdominal pain - PD fluid is clear, cell count is down to 129, no abscess or fluid collection on 2 CT's (10/7, 10/11)  so cannot attribute his current pain, especially with the severity, to his resolving culture negative peritonitis.  Amylase and lipase repeated (lipase up 10/7) and both are  normal.  CT angiogram with >50% stenosis of celiac, SMA and IMA by radiologist read raised question of mesenteric insufficiency. Dr. Trula Slade did not feel that to be cause of his pain. He is repeating a CT angio today to relook at bowel  2. Peritonitis: Culture negative. Had been improving on vanco and fortaz. PD fluid cell count falling. We have changed ATB's to IV (primarily so we could hold his PD last night). Vanco level random 31 so monitoring levels - pharmacy managing. Tressie Ellis continued at 1 gm IV Q24 hours.   3.  ESRD on PD:  Held PD last night. Resume usual this evening. Add heparin for fibrin.   4. Hypokalemia - will Rx with 40 mEq BID x 2 doses only  5.  Hypertension - BP's soft and Dr. Carles Collet has reduced meds some.  6.  Anemia: Hgb 11.4->10.7. If drops again tomorrow will add some [place]  aranesp.  7.  Metabolic bone disease: Ca 9.3. Continue binders once eating better. Not on VDRA; last PTH 139 on 10/7.  8.  Nutrition: Alb 2.7. Start supplements once eating.  9. 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 No issues right now.   Jamal Maes, MD [facility] [phone] pager 04/02/2016, 12:25 PM

## 2016-04-02 NOTE — Plan of Care (Signed)
Problem: Safety: Goal: Ability to remain free from injury will improve Outcome: Not Progressing Pt is confused and trying to get out of bed at times without help or supervision. Pts bed alarm is on and frequent rounding is done to prevent pt from a fall enviornment

## 2016-04-02 NOTE — Progress Notes (Signed)
    Subjective  -   Remains very uncomfortable with abd pain No diarrhea   Physical Exam:  abd tender to palpation No peritoneal signs       Assessment/Plan:    Abd pain with unclear etiology.  The degree of stenosis seen on CTA scan should not produce his acute symptoms.  It is concerning that he still is having this much pain.  I will repeat CT scan today to evaluate bowel  [place]ham, [place] 04/02/2016 10:03 AM --  Vitals:   04/02/16 0600 04/02/16 0800  BP: (!) 113/49 (!) 131/54  Pulse: 96 95  Resp: 16 15  Temp:  97.9 F (36.6 C)    Intake/Output Summary (Last 24 hours) at 04/02/16 1003 Last data filed at 04/02/16 0045  Gross per 24 hour  Intake              700 ml  Output             1600 ml  Net             -900 ml     Laboratory CBC    Component Value Date/Time   WBC 14.5 (H) 04/02/2016 0217   HGB 10.7 (L) 04/02/2016 0217   HGB 7.7 (L) 03/09/2013 1520   HCT 34.2 (L) 04/02/2016 0217   HCT 23.5 (L) 03/09/2013 1520   PLT 302 04/02/2016 0217   PLT 392 03/09/2013 1520    BMET    Component Value Date/Time   NA 132 (L) 04/02/2016 0217   NA 141 03/09/2013 1520   K 3.2 (L) 04/02/2016 0217   K 4.8 03/09/2013 1520   CL 93 (L) 04/02/2016 0217   CO2 24 04/02/2016 0217   CO2 30 (H) 03/09/2013 1520   GLUCOSE 111 (H) 04/02/2016 0217   GLUCOSE 110 03/09/2013 1520   BUN 33 (H) 04/02/2016 0217   BUN 26.9 (H) 03/09/2013 1520   CREATININE 9.96 (H) 04/02/2016 0217   CREATININE 5.8 Repeated and Verified (HH) 03/09/2013 1520   CALCIUM 8.6 (L) 04/02/2016 0217   CALCIUM 8.8 03/09/2013 1520   GFRNONAA 5 (L) 04/02/2016 0217   GFRAA 5 (L) 04/02/2016 0217    COAG Lab Results  Component Value Date   INR 1.23 04/02/2016   INR 1.01 08/21/2014   INR 1.07 08/03/2014   No results found for: PTT  Antibiotics Anti-infectives    Start     Dose/Rate Route Frequency Ordered Stop   04/01/16 1800  cefTAZidime (FORTAZ) 1 g in dextrose 5 % 50 mL IVPB     1 g 100  mL/hr over 30 Minutes Intravenous Every 24 hours 04/01/16 1618     04/01/16 1200  piperacillin-tazobactam (ZOSYN) IVPB 2.25 g  Status:  Discontinued     2.25 g 100 mL/hr over 30 Minutes Intravenous Every 12 hours 04/01/16 1139 04/01/16 1641       V. Leia Alf, M.D. [facility] Office: [phone] Pager:  [phone]

## 2016-04-02 NOTE — Progress Notes (Signed)
PT Cancellation Note  Patient Details Name: Victor Lane MRN: [id number] DOB: 1946-03-25   Cancelled Treatment:    Reason Eval/Treat Not Completed: Medical issues which prohibited therapy   Per RN patient has finally settled down and is sleeping (did check in room and he's asleep). She feels he needs to rest and asked PT to defer eval at this time. Will attempt 10/13   Sefora Tietje 04/02/2016, 3:01 PM  Pager [phone]

## 2016-04-02 NOTE — Progress Notes (Signed)
PROGRESS NOTE  YONA FUHRMANN [id number] DOB: 04/21/1946 DOA: 04/01/2016 PCP: [name], MD  Brief History:  70 year old male with a history of ESRD on peritoneal dialysis, CAD, diabetes mellitus type 2, carotid stenosis, aortic stenosis, GERD, laryngeal cancer presented with abdominal pain that began on 03/27/2016. The patient had associated nausea, vomiting, and diarrhea for nearly one week prior to development of his worsening abdominal pain.   After this, he had an episode with PD outflow blockage due to fibrin clot which his PD center was able to sort out on 10/6 and get him drained (he reports 10L drained). The next evening, he developed severe lower abdominal pain. He came to the ED on 10/7 for evaluation. Abdominal CT was neg for acute findings. PD Cx, gram stain, cell count collected. He was found to have increased cell count in PD fluid (1196 WBC), concerning for peritonitis. He was started on Vanc/Fortaz and discharged.  On 10/9, he went to his PD unit as directed. Repeat cell count, Cx, gram stain collected and he was re-dosed with Vanc and [place]. Cx pending, but cell count down to 292. overnight 10/10, while on his cycler, he developed recurrent severe lower abdominal pain and presented back to the ED for evaluation. Pain is constant, located in mid-lower abdomen. No N/V/diarrhea at this time.   Assessment/Plan: Abdominal pain -Etiology unclear, but suspect this is multifactorial including recent infectious gastroenteritis, peritonitis, possible mesenteric ischemia -WBC trending down on subsequent PD fluid analyses -Continue vancomycin and ceftazidime IV (initially started 03/28/2016) -Appreciate vascular surgery consult--low suspicion of mesenteric ischemia -04/01/2016 CT angiogram abdomen--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, celiac origin, IMA -Continue medical management with antibiotics, pain control, gradual diet advancement -03/28/2016 and 04/01/2016 CTs of the  abdomen negative for abscess -If no improvement, reconsult vascular surgery for possible abdominal angiogram  Peritonitis--peritoneal dialysis related -PD fluid cultures have been negative -WBC trending down on serial PD fluid analyses -04/01/2016 PD fluid WBC 129 -Continue vancomycin and ceftazidime IV -Appreciate nephrology follow-up -follow PD fluid and blood cultures  Acute toxic encephalopathy -Suspect this is related to the patient's opioids -Patient will have fine balance between pain control and confusion  ESRD on PD -appreciate nephrology followup  HTN -d/c am dose lisinopril due to soft BP -d/c amlodipine due to soft BP -continue metoprolol succinate and pm lisinopril  Chronic systolic CHF -[id number] echo EF 30-35% -Clinically compensated  Diabetes mellitus type 2 -04/01/2016 hemoglobin A1c 5.5 -CBGs well-controlled -Continue NovoLog sliding scale    Disposition Plan:  Not stable for d/c Family Communication:   No Family at bedside--Total time spent 35 minutes.  Greater than 50% spent face to face counseling and coordinating care.   Consultants:  Nephrology, VVS  Code Status:  FULL  DVT Prophylaxis:  SCD   Procedures: As Listed in Progress Note Above  Antibiotics: None    Subjective: Patient is pleasant but confused. He complains of abdominal pain but denies any headaches, chest pain or discharge breath, nausea, vomiting, diarrhea. No reports of uncontrolled pain or respiratory distress.  Objective: Vitals:   04/02/16 0200 04/02/16 0300 04/02/16 0445 04/02/16 0600  BP: (!) 117/54 (!) 110/55 109/64 (!) 113/49  Pulse: 96 95 94 96  Resp: 14 15 13 16   Temp:   97.7 F (36.5 C)   TempSrc:   Oral   SpO2: 98% 97% 98% 100%  Weight:      Height:  Intake/Output Summary (Last 24 hours) at 04/02/16 0844 Last data filed at 04/02/16 0045  Gross per 24 hour  Intake              700 ml  Output             1600 ml  Net             -900 ml    Weight change: -0.794 kg (-1 lb 12 oz) Exam:   General:  Pt is alert, follows commands appropriately, not in acute distress  HEENT: No icterus, No thrush, No neck mass, [place]/AT  Cardiovascular: RRR, S1/S2, no rubs, no gallops  Respiratory: CTA bilaterally, no wheezing, no crackles, no rhonchi  Abdomen: Soft/+BS, non tender, non distended, no guarding  Extremities: No edema, No lymphangitis, No petechiae, No rashes, no synovitis   Data Reviewed: I have personally reviewed following labs and imaging studies Basic Metabolic Panel:  Recent Labs Lab 03/28/16 1942 04/01/16 0507 04/02/16 0217  NA 133* 134* 132*  K 2.9* 2.5* 3.2*  CL 88* 90* 93*  CO2 28 26 24   GLUCOSE 98 164* 111*  BUN 43* 25* 33*  CREATININE 9.59* 8.45* 9.96*  CALCIUM 9.0 9.3 8.6*   Liver Function Tests:  Recent Labs Lab 03/28/16 1942 04/01/16 0507 04/02/16 0217  AST 17 23 23   ALT 23 24 22   ALKPHOS 100 107 96  BILITOT 0.2* 0.6 0.4  PROT 6.6 6.7 5.6*  ALBUMIN 2.5* 2.5* 2.1*    Recent Labs Lab 03/28/16 1942  LIPASE 102*   No results for input(s): AMMONIA in the last 168 hours. Coagulation Profile:  Recent Labs Lab 04/02/16 0217  INR 1.23   CBC:  Recent Labs Lab 03/28/16 1942 04/01/16 0507 04/02/16 0217  WBC 12.5* 10.4 14.5*  NEUTROABS  --  7.9*  --   HGB 10.8* 11.3* 10.7*  HCT 34.5* 35.7* 34.2*  MCV 107.5* 106.6* 105.9*  PLT 314 390 302   Cardiac Enzymes: No results for input(s): CKTOTAL, CKMB, CKMBINDEX, TROPONINI in the last 168 hours. BNP: Invalid input(s): POCBNP CBG:  Recent Labs Lab 04/01/16 1516 04/01/16 2112 04/02/16 0743  GLUCAP 128* 93 126*   HbA1C:  Recent Labs  04/01/16 1306  HGBA1C 5.5   Urine analysis:    Component Value Date/Time   COLORURINE YELLOW 08/07/2013 1855   APPEARANCEUR CLOUDY (A) 08/07/2013 1855   LABSPEC 1.020 08/07/2013 1855   PHURINE 7.5 08/07/2013 1855   GLUCOSEU 100 (A) 08/07/2013 1855   HGBUR MODERATE (A) 08/07/2013 1855    BILIRUBINUR SMALL (A) 08/07/2013 1855   KETONESUR NEGATIVE 08/07/2013 1855   PROTEINUR >300 (A) 08/07/2013 1855   UROBILINOGEN 1.0 08/07/2013 1855   NITRITE NEGATIVE 08/07/2013 1855   LEUKOCYTESUR TRACE (A) 08/07/2013 1855   Sepsis Labs: @LABRCNTIP (procalcitonin:4,lacticidven:4) ) Recent Results (from the past 240 hour(s))  Gram stain     Status: None   Collection Time: 03/29/16  1:12 AM  Result Value Ref Range Status   Specimen Description FLUID  Final   Special Requests PERITONEAL  Final   Gram Stain   Final    CYTOSPIN SMEAR WBC PRESENT,BOTH PMN AND MONONUCLEAR NO ORGANISMS SEEN    Report Status 03/29/2016 FINAL  Final  Culture, body fluid-bottle     Status: None (Preliminary result)   Collection Time: 03/29/16  1:12 AM  Result Value Ref Range Status   Specimen Description FLUID  Final   Special Requests PERITONEAL BOTTLES DRAWN AEROBIC AND ANAEROBIC  Final   Culture  NO GROWTH 3 DAYS  Final   Report Status PENDING  Incomplete  Culture, blood (Routine x 2)     Status: None (Preliminary result)   Collection Time: 04/01/16  5:07 AM  Result Value Ref Range Status   Specimen Description BLOOD RIGHT ARM  Final   Special Requests BOTTLES DRAWN AEROBIC AND ANAEROBIC 5ML  Final   Culture NO GROWTH < 12 HOURS  Final   Report Status PENDING  Incomplete  Culture, blood (Routine x 2)     Status: None (Preliminary result)   Collection Time: 04/01/16  5:50 AM  Result Value Ref Range Status   Specimen Description BLOOD RIGHT HAND  Final   Special Requests AEROBIC BOTTLE ONLY 5ML  Final   Culture NO GROWTH < 12 HOURS  Final   Report Status PENDING  Incomplete  Culture, body fluid-bottle     Status: None (Preliminary result)   Collection Time: 04/01/16  8:12 AM  Result Value Ref Range Status   Specimen Description PERITONEAL  Final   Special Requests NONE  Final   Culture NO GROWTH < 12 HOURS  Final   Report Status PENDING  Incomplete  Gram stain     Status: None   Collection  Time: 04/01/16  8:12 AM  Result Value Ref Range Status   Specimen Description PERITONEAL  Final   Special Requests NONE  Final   Gram Stain   Final    FEW WBC PRESENT, PREDOMINANTLY PMN NO ORGANISMS SEEN    Report Status 04/01/2016 FINAL  Final  MRSA PCR Screening     Status: None   Collection Time: 04/01/16  5:49 PM  Result Value Ref Range Status   MRSA by PCR NEGATIVE NEGATIVE Final    Comment:        The GeneXpert MRSA Assay (FDA approved for NASAL specimens only), is one component of a comprehensive MRSA colonization surveillance program. It is not intended to diagnose MRSA infection nor to guide or monitor treatment for MRSA infections.      Scheduled Meds: . amLODipine  5 mg Oral BH-q7a  . calcium acetate  1,334 mg Oral BID WC  . calcium acetate  667 mg Oral Q supper  . cefTAZidime (FORTAZ)  IV  1 g Intravenous Q24H  . chlorhexidine  15 mL Mouth/Throat QHS  . gabapentin  100 mg Oral BID  . insulin aspart  0-9 Units Subcutaneous TID WC  . ipratropium  2 spray Each Nare BID  . lisinopril  20 mg Oral QHS  . lisinopril  40 mg Oral BH-q7a  . metoprolol succinate  50 mg Oral BID  . multivitamin  1 tablet Oral Daily  . pantoprazole  40 mg Oral Daily  . rosuvastatin  5 mg Oral Once per day on Mon Wed Fri  . sodium chloride flush  3 mL Intravenous Q12H   Continuous Infusions:   Procedures/Studies: Dg Chest 2 View  Result Date: 04/01/2016 CLINICAL DATA:  Suspected sepsis.  Peritoneal dialysis patient. EXAM: CHEST  2 VIEW COMPARISON:  Chest radiograph 07/19/2015.  Abdominal CT 03/28/2016 FINDINGS: Stable cardiomegaly and mediastinal contours. There is thoracic aortic atherosclerosis. Small bilateral pleural effusions and bibasilar airspace disease. No pulmonary edema. No pneumothorax. Vascular stent in the left axilla. Free air under the right hemidiaphragm, likely related to peritoneal dialysis. Resorption of the distal right clavicle. IMPRESSION: Chronic cardiomegaly,  stable.  Thoracic aortic atherosclerosis. Small bilateral pleural effusions with bibasilar airspace disease, likely atelectasis. Electronically Signed   By: Threasa Beards  Ehinger M.D.   On: 04/01/2016 05:40   Ct Abdomen Pelvis W Contrast  Result Date: 03/29/2016 CLINICAL DATA:  Acute onset of generalized abdominal pain. Recent peritoneal dialysis. Vomiting and diarrhea. Initial encounter. EXAM: CT ABDOMEN AND PELVIS WITH CONTRAST TECHNIQUE: Multidetector CT imaging of the abdomen and pelvis was performed using the standard protocol following bolus administration of intravenous contrast. CONTRAST:  161mL ISOVUE-300 IOPAMIDOL (ISOVUE-300) INJECTION 61% COMPARISON:  CT of the abdomen and pelvis from 11/26/2014 FINDINGS: Lower chest: Trace right-sided pleural fluid is noted. Bibasilar atelectasis or scarring is seen. The visualized portions of the mediastinum are unremarkable. Hepatobiliary: A small amount of ascites is seen tracking about the liver. The liver is unremarkable in appearance. The patient is status post cholecystectomy, with clips noted at the gallbladder fossa. The common bile duct remains normal in caliber. Pancreas: The pancreas is within normal limits. Spleen: The spleen is unremarkable in appearance. Adrenals/Urinary Tract: The adrenal glands are grossly unremarkable in appearance. Severe chronic bilateral renal atrophy is noted, with scattered bilateral renal cysts. There is no evidence of hydronephrosis. No renal or ureteral stones are identified. No significant perinephric stranding is seen. Stomach/Bowel: The stomach is unremarkable in appearance. The small bowel is within normal limits. The appendix is normal in caliber, without evidence of appendicitis. Scattered diverticulosis is noted along the distal descending and sigmoid colon, without definite evidence of diverticulitis. Vascular/Lymphatic: Diffuse calcification is seen along the abdominal aorta and its branches. There is relatively severe  luminal narrowing at the proximal right common iliac artery, and moderate luminal narrowing along the left common iliac artery. Relatively severe luminal narrowing is also noted along the proximal left superficial femoral artery. No retroperitoneal or pelvic sidewall lymphadenopathy is seen. Reproductive: The bladder is mildly distended and grossly unremarkable. The prostate remains normal in size. Other: The patient's peritoneal dialysis catheter is noted within the pelvis, with a small to moderate amount of free fluid in the pelvis and along both paracolic gutters. Musculoskeletal: No acute osseous abnormalities are identified. The visualized musculature is unremarkable in appearance. IMPRESSION: 1. No acute abnormality seen to explain the patient's symptoms. 2. Status post recent peritoneal dialysis. Small to moderate volume ascites noted within the abdomen and pelvis. 3. Diffuse aortic atherosclerosis noted. Relatively severe luminal narrowing at the proximal right common iliac artery, and moderate luminal narrowing along the left common iliac artery. Relatively severe luminal narrowing also noted along the proximal left superficial femoral artery. 4. Severe chronic bilateral renal atrophy, with scattered bilateral renal cysts. 5. Scattered diverticulosis along the distal descending and sigmoid colon, without definite evidence of diverticulitis. 6. Trace right-sided pleural fluid. Bibasilar atelectasis or scarring noted. Electronically Signed   By: Garald Balding M.D.   On: 03/29/2016 00:10   Ct Angio Abd/pel W And/or Wo Contrast  Result Date: 04/01/2016 CLINICAL DATA:  70 year old male with a history of lower abdominal pain and nausea vomiting. Constipation. EXAM: CTA ABDOMEN AND PELVIS WITH CONTRAST TECHNIQUE: Multidetector CT imaging of the abdomen and pelvis was performed using the standard protocol during bolus administration of intravenous contrast. Multiplanar reconstructed images and MIPs were  obtained and reviewed to evaluate the vascular anatomy. CONTRAST:  75 cc Isovue 370 COMPARISON:  CT 03/28/2016, 11/26/2014, 08/06/2013 FINDINGS: VASCULAR Aorta: Diffuse atherosclerosis of the abdominal aorta with soft plaque and calcified plaque. No dissection. No aneurysm. No periaortic fluid. Celiac: Atherosclerotic changes at the origin of the celiac artery which is patent. Post stenotic dilation. Typical branch pattern of the celiac artery, with  left gastric, common hepatic, and splenic artery identified. SMA: Atherosclerotic changes of the superior mesenteric artery, with mixed calcified and soft plaque at the origin. The SMA appears patent, with greater than 50% stenosis. Renals: Dense calcified and soft plaque at the origin the bilateral renal arteries. Renal arteries are patent. Single left and single right renal artery. IMA: Inferior mesenteric artery is patent, though appears stenotic at the origin. Left colic artery patent. Superior rectal artery patent. Right lower extremity: Dense calcified and soft plaque involving the right iliac system, including common iliac artery and external iliac artery. Small caliber vessels, with likely multifocal stenoses. Greatest degree of stenosis may be at the distal common iliac artery above the hypogastric origin. Hypogastric artery is patent. Common femoral artery patent. Proximal SFA and profunda femoris patent. Left lower extremity: Mixed calcified and soft plaque of the left iliac system. Greatest degree of stenosis at the origin of the external iliac artery. Hypogastric artery patent. Common femoral artery patent. Proximal SFA and profunda femoris patent. Veins: Unremarkable appearance of the venous system. Review of the MIP images confirms the above findings. NON-VASCULAR Lower chest: Atelectatic changes at the lung bases. Small hiatal hernia. Hepatobiliary: Morphologic changes of the liver compatible with cirrhosis with developing left hepatic enlargement.  Nodular contour. No focal lesion identified on the current CT. No intrahepatic or extrahepatic biliary ductal dilatation. Cholecystectomy Pancreas: Unremarkable appearance of the pancreas. No pericholecystic fluid or inflammatory changes. Unremarkable ductal system. Spleen: Borderline splenomegaly. Adrenals/Urinary Tract: Unremarkable appearance of adrenal glands. Right: No hydronephrosis. Advanced cortical renal thinning. Symmetric perfusion to the left. Hypodense lesions within the cortex of the right kidney, the majority are too small to characterize though arm most likely benign cysts. Left: No hydronephrosis. Advanced cortical renal thinning come symmetric to the right. Symmetric perfusion. Cystic lesions in the cortex of the left kidney, most likely all benign cysts although the majority are too small to characterize. Unremarkable appearance of the urinary bladder with high density excreted contrast. Stomach/Bowel: Unremarkable appearance of the stomach. Small hiatal hernia. Unremarkable appearance of small bowel. Venous phase demonstrates enhancement of the small bowel and colon, although the colon somewhat limited by the presence of enteric contrast. Colonic diverticula. Lymphatic: Multiple lymph nodes in the para-aortic nodal station, none of which are enlarged. Mesenteric: Low-density free fluid within the abdomen, associated with peritoneal dialysis catheter. Small volume of free air in the anti dependent aspect. Reproductive: Unremarkable appearance of the prostate and pelvic organs. Other: Small fat containing umbilical hernia. Unremarkable appearance of the tract of the dialysis catheter. Musculoskeletal: No evidence of acute fracture. Developing sclerotic appearance of the skeletal structures, potentially early renal osteodystrophy. No bony canal narrowing. No significant degenerative changes of the hips. IMPRESSION: VASCULAR No acute vascular abnormality identified. Advanced aortic atherosclerosis.  There are associated changes at the origin of all mesenteric vessel, though they remain patent. There may be 50 percent stenosis at the celiac artery origin, with greater than 50 percent stenosis of the superior mesenteric artery origin, and potentially greater than 50 percent stenosis at the origin of the IMA, although degree of stenosis cannot be determined by CTA. This pattern of disease certainly places the patient at risk for chronic mesenteric ischemia. Correlation with patient's history may be useful, and potentially mesenteric duplex exam if there is concern for compromise. Bilateral renal artery stenosis, potentially contributing to advanced renal cortical thinning. NON-VASCULAR Morphologic changes of the liver compatible with cirrhosis. Referral for establishing gastroenterology care may be considered if not already  established, as well as consideration of initiating surveillance for hepatocellular carcinoma. Peritoneal dialysis catheter with free fluid likely associated with treatment. Tiny amount of anti dependent gas within the abdomen, again likely associated with the peritoneal dialysis. Again, bilateral renal cortical cysts. While these are most likely benign, there is incomplete characterization given the size. Developing renal osteodystrophy. Signed, Dulcy Fanny. Earleen Newport, DO [facility]iology Electronically Signed   By: Corrie Mckusick D.O.   On: 04/01/2016 09:00    Elowyn Raupp, DO  [facility] Pager [phone]  If 7PM-7AM, please contact night-coverage [email or web address] Password [email or web address] 04/02/2016, 8:44 AM   LOS: 1 day

## 2016-04-03 DIAGNOSIS — I10 Essential (primary) hypertension: Secondary | ICD-10-CM

## 2016-04-03 DIAGNOSIS — R109 Unspecified abdominal pain: Secondary | ICD-10-CM

## 2016-04-03 LAB — GLUCOSE, CAPILLARY
GLUCOSE-CAPILLARY: 107 mg/dL — AB (ref 65–99)
GLUCOSE-CAPILLARY: 107 mg/dL — AB (ref 65–99)
GLUCOSE-CAPILLARY: 134 mg/dL — AB (ref 65–99)
Glucose-Capillary: 153 mg/dL — ABNORMAL HIGH (ref 65–99)
Glucose-Capillary: 88 mg/dL (ref 65–99)

## 2016-04-03 LAB — CULTURE, BODY FLUID-BOTTLE

## 2016-04-03 LAB — CULTURE, BODY FLUID W GRAM STAIN -BOTTLE: Culture: NO GROWTH

## 2016-04-03 LAB — PATHOLOGIST SMEAR REVIEW

## 2016-04-03 NOTE — Consult Note (Signed)
[facility] Consultation Note  Referring Provider: Dr. Shanon Brow Tat Primary Care Physician:  [name], MD  Reason for Consultation:  Abdominal pain  HPI: Victor Lane is a 70 y.o. male whom I've been asked to see for abdominal pain.  Patient has been heavily medicated for his pain, thus I can't carry on a conversation with him, and the bulk of the history is from his wife.  Patient has seen me a few times in the past for ill-defined intermittent lower abdominal pains, for the past couple years, which only started once he began peritoneal dialysis (never before).  He, at that time, also told me that he became constipated with initiation of peritoneal dialysis, thus the leading thought was that the pain might be at least in part constipation-related.  I last saw patient in December 2016 and hadn't heard back from them with any further pain complaints.  Patient was accordingly in static state of GI health until he had acute onset of nausea, vomiting and diarrhea about one week ago.  Patient was treated for presumed infectious gastroenteritis, but developed progressively worsening lower abdominal pain about one week ago.  No blood in stool.  No further diarrhea.  Vomiting essentially resolved.  Nausea improved.  Pain is constant and not clearly associated with oral intake.  No weight loss.  Patient has had three CT scans within the past 5 days, which have shown small peritoneal ascites, no pneumoperitoneum, some small bowel thickening on 10/11 scan improving on 10/12 scan, and what appears to be at least 50% stenosis of celiac, SMA and IMA vessels, with some post-stenotic dilatation of SMA worrisome for high-grade stenosis (per yesterday's scan report).  Patient's ascites fluid studies have been consistent with secondary peritonitis, but cultures negative, and patient has not improved despite use of broad-spectrum antibiotics.     Past Medical History:  Diagnosis Date  . Aortic  regurgitation   . Aortic valve sclerosis   . Arthritis    "just age related" (08/03/2014)  . Carotid artery occlusion    s/p left CEA with 40-59% restenosis and >80% stenosis of the Right ICA - followed by Dr. Kellie Simmering  . Cataracts, bilateral   . Chronic combined systolic and diastolic CHF (congestive heart failure) ([place])   . Complication of anesthesia    hard to wake up and then panics waking up  . Coronary artery disease    severe CAD with mid RCA occlusion, distal LAD stenosis, moderate disease of the left circ. Repeat cath 07/2014 for worsening LVF showed severe disease of the mid LAD, mild to mod LCX disease, occluded PL of the RCA with left to right collaterals to distal RCA territory s/p PCI of the LAD with DES.  It was noted that if patient develops CP resistant to medical therapy could consider PCI of RCA  . Diastolic dysfunction   . Dilated aortic root ([place])   . ESRD on peritoneal dialysis ([place]) 09/2013   "followed by [name]; [facility]" (08/03/2014)  . Fibromyalgia    takes tramadol daily as needed  . GERD (gastroesophageal reflux disease)   . H/O vitamin D deficiency   . Hearing loss   . History of colon polyps   . Hypercholesteremia   . Hypertension   . Myalgia and myositis   . Neuropathy ([place])   . Peripheral neuropathy ([place])   . S/P radiation therapy 01/24/2013-03/14/2013   Larynx/glottis / 63 Gy in 28 fractions  . Sleep apnea    to  request from Dr.Fried was done about 55yrs ago but no cpap d/t weight loss  . Type II diabetes mellitus ([place])    "diet controlled now" (08/03/2014)  . 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 AV FISTULA PLACEMENT Left 02/23/2013   Procedure: ARTERIOVENOUS (AV) FISTULA CREATION;  Surgeon: Conrad Joliet, MD;  Location: [facility];  Service: Vascular;  Laterality: Left;  . CAPD INSERTION N/A 08/01/2013   Procedure:  LAPAROSCOPIC INSERTION CONTINUOUS AMBULATORY PERITONEAL DIALYSIS CATHETER;  Surgeon: [name], MD;  Location: [facility];  Service: General;  Laterality: N/A;  . CARDIAC CATHETERIZATION  07/2013  . CAROTID ENDARTERECTOMY Left   . CAROTID STENT INSERTION N/A 08/21/2014   Procedure: CAROTID STENT INSERTION;  Surgeon: [name], MD;  Location: [facility] C[facility] LAB;  Service: Cardiovascular;  Laterality: N/A;  . CHOLECYSTECTOMY  1993  . COLONOSCOPY W/ BIOPSIES AND POLYPECTOMY     Hx: of  . CORONARY ANGIOPLASTY  08/03/2014  . LAPAROSCOPIC ABDOMINAL EXPLORATION N/A 08/01/2013   Procedure: LAPAROSCOPIC ABDOMINAL EXPLORATION;  Surgeon: [name], MD;  Location: [facility];  Service: General;  Laterality: N/A;  . LEFT AND RIGHT HEART CATHETERIZATION WITH CORONARY ANGIOGRAM N/A 08/03/2014   Procedure: LEFT AND RIGHT HEART CATHETERIZATION WITH CORONARY ANGIOGRAM;  Surgeon: [name], MD;  Location[facility] C[facility] LAB;  Service: Cardiovascular;  Laterality: N/A;  . LEFT HEART CATHETERIZATION WITH CORONARY ANGIOGRAM N/A 08/14/2013   Procedure: LEFT HEART CATHETERIZATION WITH CORONARY ANGIOGRAM;  Surgeon: [name], MD;  Location: [facility] CATH LAB;  Service: Cardiovascular;  Laterality: N/A;  . MICROLARYNGOSCOPY WITH LASER N/A 01/04/2013   Procedure: MICROLARYNGOSCOPY WITH BIOPSY/LASER;  Surgeon: [name], MD;  Location: [facility];  Service: ENT;  Laterality: N/A;  . SHOULDER OPEN ROTATOR CUFF REPAIR Right 07/06/2011  . TRACHEOSTOMY  01/2013  . TRACHEOSTOMY CLOSURE  02/2013  . ULNAR NERVE TRANSPOSITION Left 02/22/2014   Procedure: LEFT ULNAR NERVE RELEASE AND OR TRANSPOSITION;  Surgeon: [name], MD;  Location: [facility];  Service: Orthopedics;  Laterality: Left;    Prior to Admission medications   Medication Sig Start Date End Date Taking? Authorizing Provider  acetaminophen (TYLENOL) 500 MG tablet Take 1,000 mg by mouth every 6 (six) hours as needed for mild pain or headache.    Yes Historical Provider, MD   ALPRAZolam Duanne Moron) 0.5 MG tablet Take 0.5 mg by mouth at bedtime.  02/05/16  Yes Historical Provider, MD  amLODipine (NORVASC) 5 MG tablet Take 5 mg by mouth every morning. 12/02/15  Yes Historical Provider, MD  aspirin 325 MG EC tablet Take 325 mg by mouth daily. 01/31/16  Yes Historical Provider, MD  calcium acetate (PHOSLO) 667 MG capsule Take 667-1,334 mg by mouth See admin instructions. Take 2 capsules (1334 mg) every morning and at lunch. Depending on the evening meal, takes 667 mg (1 tablet) or 1334 mg (2 tablets)   Yes Historical Provider, MD  chlorhexidine (PERIDEX) 0.12 % solution Use as directed 15 mLs in the mouth or throat at bedtime.  02/26/16  Yes Historical Provider, MD  DOCOSAHEXAENOIC ACID PO Take 2 g by mouth every morning.   Yes Historical Provider, MD  gabapentin (NEURONTIN) 100 MG capsule Take 100 mg by mouth 2 (two) times daily.   Yes Historical Provider, MD  gentamicin cream (GARAMYCIN) 0.1 % Apply 1 application topically daily. 03/10/16  Yes Historical Provider, MD  glipiZIDE (GLUCOTROL XL) 2.5 MG 24 hr tablet Take 2.5 mg by mouth daily as needed (for high blood sugar).  01/27/16  Yes Historical Provider, MD  ipratropium (ATROVENT) 0.06 % nasal spray Place 2 sprays into both nostrils 2 (two) times daily.    Yes Historical Provider, MD  lisinopril (PRINIVIL,ZESTRIL) 20 MG tablet Take 20 mg by mouth at bedtime. 03/17/16  Yes Historical Provider, MD  lisinopril (PRINIVIL,ZESTRIL) 40 MG tablet Take 40 mg by mouth every morning. 01/26/16  Yes Historical Provider, MD  metoprolol succinate (TOPROL-XL) 50 MG 24 hr tablet Take 50 mg by mouth 2 (two) times daily. Take with or immediately following a meal.   Yes Historical Provider, MD  multivitamin (RENA-VIT) TABS tablet Take 1 tablet by mouth daily.   Yes Historical Provider, MD  mupirocin ointment (BACTROBAN) 2 % Apply 1 application topically 2 (two) times daily as needed (rash).    Yes Historical Provider, MD  nitroGLYCERIN (NITROSTAT) 0.4  MG SL tablet Place 1 tablet (0.4 mg total) under the tongue every 5 (five) minutes as needed for chest pain. 08/05/14  Yes Brittainy Erie Noe, PA-C  omeprazole (PRILOSEC) 20 MG capsule Take 20 mg by mouth every morning. 03/04/16  Yes Historical Provider, MD  oxyCODONE-acetaminophen (PERCOCET/ROXICET) 5-325 MG tablet Take 1 tablet by mouth every 6 (six) hours as needed for severe pain. 03/29/16  Yes Tiffany Carlota Raspberry, PA-C  rosuvastatin (CRESTOR) 5 MG tablet Take 1 tablet by mouth as directed up to once daily Patient taking differently: Take 5 mg by mouth 3 (three) times a week. Monday, Wednesday, Friday at noon 08/10/14  Yes Sueanne Margarita, MD  traMADol (ULTRAM) 50 MG tablet Take 100 mg by mouth every 4 (four) hours as needed (pain).   Yes Historical Provider, MD  triamcinolone cream (KENALOG) 0.1 % Apply 1 application topically 2 (two) times daily as needed (rash).  08/07/14  Yes Historical Provider, MD  lisinopril (PRINIVIL,ZESTRIL) 5 MG tablet TAKE 1 TABLET (5 MG TOTAL) BY MOUTH DAILY. Patient not taking: Reported on 04/01/2016 01/24/15   Liliane Shi, PA-C    Current Facility-Administered Medications  Medication Dose Route Frequency Provider Last Rate Last Dose  . acetaminophen (TYLENOL) tablet 650 mg  650 mg Oral Q6H PRN Rondel Jumbo, PA-C       Or  . acetaminophen (TYLENOL) suppository 650 mg  650 mg Rectal Q6H PRN Rondel Jumbo, PA-C      . bisacodyl (DULCOLAX) suppository 10 mg  10 mg Rectal Daily PRN Rondel Jumbo, PA-C      . calcium acetate (PHOSLO) capsule 1,334 mg  1,334 mg Oral BID WC Jamal Maes, MD   1,334 mg at 04/02/16 0824  . calcium acetate (PHOSLO) capsule 667 mg  667 mg Oral Q supper Jamal Maes, MD      . cefTAZidime (FORTAZ) 1 g in dextrose 5 % 50 mL IVPB  1 g Intravenous Q24H Jamal Maes, MD   1 g at 04/02/16 1704  . chlorhexidine (PERIDEX) 0.12 % solution 15 mL  15 mL Mouth/Throat QHS Rondel Jumbo, PA-C   15 mL at 04/02/16 2102  . chlorproMAZINE (THORAZINE)  tablet 25 mg  25 mg Oral TID PRN Jeryl Columbia, NP      . dialysis solution 1.5% low-MG/low-CA dianeal solution   Intraperitoneal Q24H Jamal Maes, MD   3,000 mL at 04/02/16 1622  . dialysis solution 1.5% low-MG/low-CA dianeal solution   Intraperitoneal Q24H Jamal Maes,  MD   5,000 mL at 04/02/16 2031  . gabapentin (NEURONTIN) capsule 100 mg  100 mg Oral BID Rondel Jumbo, PA-C   100 mg at 04/03/16 1041  . gentamicin cream (GARAMYCIN) 0.1 % 1 application  1 application Topical Daily Jamal Maes, MD   1 application at [id number]  . heparin 1000 unit/ml injection 2,500 Units  2,500 Units Intraperitoneal PRN Jamal Maes, MD      . hydrALAZINE (APRESOLINE) injection 5-10 mg  5-10 mg Intravenous Q8H PRN Rondel Jumbo, PA-C      . HYDROmorphone (DILAUDID) injection 1 mg  1 mg Intravenous Q2H PRN Orson Eva, MD   1 mg at 04/03/16 1115  . insulin aspart (novoLOG) injection 0-9 Units  0-9 Units Subcutaneous TID WC Rondel Jumbo, PA-C   2 Units at 04/03/16 0800  . ipratropium (ATROVENT) 0.06 % nasal spray 2 spray  2 spray Each Nare BID Rondel Jumbo, PA-C   2 spray at 04/02/16 2105  . lisinopril (PRINIVIL,ZESTRIL) tablet 20 mg  20 mg Oral QHS Rondel Jumbo, PA-C   20 mg at 04/02/16 2103  . metoprolol succinate (TOPROL-XL) 24 hr tablet 50 mg  50 mg Oral BID Rondel Jumbo, PA-C   50 mg at 04/02/16 2103  . multivitamin (RENA-VIT) tablet 1 tablet  1 tablet Oral Daily Rondel Jumbo, PA-C   1 tablet at 04/03/16 1040  . ondansetron (ZOFRAN) tablet 4 mg  4 mg Oral Q6H PRN Rondel Jumbo, PA-C   4 mg at 04/02/16 0358   Or  . ondansetron (ZOFRAN) injection 4 mg  4 mg Intravenous Q6H PRN Rondel Jumbo, PA-C   4 mg at 04/01/16 1247  . oxyCODONE (Oxy IR/ROXICODONE) immediate release tablet 5 mg  5 mg Oral Q4H PRN Waldemar Dickens, MD   5 mg at 04/02/16 0824  . pantoprazole (PROTONIX) EC tablet 40 mg  40 mg Oral Daily Rondel Jumbo, PA-C   40 mg at 04/03/16 1040  . polyethylene glycol (MIRALAX /  GLYCOLAX) packet 17 g  17 g Oral Daily Orson Eva, MD   17 g at 04/03/16 1040  . rosuvastatin (CRESTOR) tablet 5 mg  5 mg Oral Once per day on Mon Wed Fri Sara E Wertman, PA-C      . senna-docusate (Senokot-S) tablet 1 tablet  1 tablet Oral QHS PRN Rondel Jumbo, PA-C      . sodium chloride flush (NS) 0.9 % injection 3 mL  3 mL Intravenous Q12H Rondel Jumbo, PA-C   3 mL at 04/03/16 1149  . traZODone (DESYREL) tablet 25 mg  25 mg Oral QHS PRN Rondel Jumbo, PA-C        Allergies as of 04/01/2016 - Review Complete 04/01/2016  Allergen Reaction Noted  . Fentanyl Other (See Comments) 03/06/2013  . Nsaids Other (See Comments) 08/07/2013  . Amoxicillin Diarrhea and Nausea And Vomiting 03/23/2011  . Moxifloxacin Diarrhea and Nausea And Vomiting 02/12/2014    Family History  Problem Relation Age of Onset  . Adopted: Yes  . Family history unknown: Yes    Social History   Social History  . Marital status: Married    Spouse name: Fraser Din  . Number of children: 1  . Years of education: HS   Occupational History  .      Self-Employed   Social History Main Topics  . Smoking status: Former Smoker    Packs/day: 2.00    Years: 31.00  Types: Cigarettes    Quit date: 08/07/1995  . Smokeless tobacco: Never Used     Comment: quit smoking in 1997  . Alcohol use No  . Drug use: No  . Sexual activity: Yes   Other Topics Concern  . Not on file   Social History Narrative   Patient lives at home with spouse.   Caffiene Use: 1 cup daily    Review of Systems: Unable to obtain (patient medicated and somnolent, can't provide much history; most of history is from wife).  Physical Exam: Vital signs in last 24 hours: Temp:  [97.5 F (36.4 C)-98.3 F (36.8 C)] 98.3 F (36.8 C) (10/13 0401) Pulse Rate:  [87-106] 92 (10/13 0401) Resp:  [12-21] 21 (10/13 0200) BP: (98-156)/(45-71) 122/63 (10/13 0401) SpO2:  [93 %-100 %] 94 % (10/13 0200) FiO2 (%):  [2 %] 2 % (10/12 1925) Weight:  [80.8 kg  (178 lb 2.1 oz)] 80.8 kg (178 lb 2.1 oz) (10/12 1925) Last BM Date:  (pt states he does not know) General:   Somnolent, intermittently arouseable, can't carry on a conversation, intermittently writhing in bed Head:  Normocephalic and atraumatic. Eyes:  Sclera clear, no icterus.   Conjunctiva pink. Ears:  Normal auditory acuity. Nose:  No deformity, discharge,  or lesions. Mouth:  No deformity or lesions.  Oropharynx dry and pale Neck:  Supple; no masses or thyromegaly. Lungs:  Clear throughout to auscultation.   No wheezes, crackles, or rhonchi. No acute distress. Heart:  Distant heart sounds, Regular rate and rhythm; no murmurs, clicks, rubs,  or gallops. Abdomen:  Mildly protuberant, right periumbilical tenderness with voluntary guarding and rebound tenderness; scant (if any) bowel sounds. No masses, hepatosplenomegaly or hernias noted. Peritoneal dialysis catheter in place (L-PU) Msk:  Symmetrical without gross deformities. Normal posture. Pulses:  Normal pulses noted. Extremities:  Without clubbing or edema. Neurologic:  Somnolent but arouseable, can't carry on conversation  Skin: Sallow; scattered ecchymosesIntact without significant lesions or rashes. Cervical Nodes:  No significant cervical adenopathy. Psych:  Flat affect, somnolent but arouseable    Lab Results:  Recent Labs  04/01/16 0507 04/02/16 0217  WBC 10.4 14.5*  HGB 11.3* 10.7*  HCT 35.7* 34.2*  PLT 390 302   BMET  Recent Labs  04/01/16 0507 04/02/16 0217  NA 134* 132*  K 2.5* 3.2*  CL 90* 93*  CO2 26 24  GLUCOSE 164* 111*  BUN 25* 33*  CREATININE 8.45* 9.96*  CALCIUM 9.3 8.6*   LFT  Recent Labs  04/02/16 0217  PROT 5.6*  ALBUMIN 2.1*  AST 23  ALT 22  ALKPHOS 96  BILITOT 0.4   PT/INR  Recent Labs  04/02/16 0217  LABPROT 15.6*  INR 1.23    Studies/Results: Ct Angio Abdomen Pelvis  W &/or Wo Contrast  Result Date: 04/03/2016 CLINICAL DATA:  70 year old male with abdominal pain.  Negative lactic acidosis. Renal failure with peritoneal dialysis catheter. EXAM: CT ANGIOGRAPHY  ABDOMEN AND PELVIS TECHNIQUE: Multidetector CT imaging through the abdomen and pelvis was performed using the standard protocol during bolus administration of intravenous contrast. Delayed images included. Multiplanar reconstructed images including MIPs were obtained and reviewed to evaluate the vascular anatomy. CONTRAST:  100 cc Isovue 370 COMPARISON:  CT angiogram done the previous day, CT 03/28/2016, CT 11/26/2014 FINDINGS: VASCULAR Aorta: Extensive mixed calcified and soft plaque of the lower thoracic and abdominal aorta. No aneurysm. No dissection. No periaortic fluid. Celiac: Similar appearance of atherosclerotic changes at the origin of the celiac  artery, which is patent though perhaps with 50% or greater stenosis. Post stenotic dilation. Typical branch pattern again noted, discussed on the prior CT. SMA: Similar stenotic appearance of the superior mesenteric artery with mixed calcified and soft plaque. Greater than 50% stenosis. Distal branches appear patent. Renals: Calcified and soft plaque at the origin the bilateral renal arteries. Single bilateral renal arteries. IMA: Patent IMA. Likely stenosis at the origin. Difficult to quantify degree of stenosis. Right lower extremity: Mixed calcified and soft plaque of the right iliac system. Again, likely greater than 50% stenosis in the proximal right common iliac artery. Hypogastric artery and external iliac artery patent. Atherosclerotic disease throughout the external iliac artery. Common femoral artery in the proximal full more all vessels patent. Left lower extremity: Mixed calcified and soft plaque of the left iliac system. Likely stenosis at the distal common iliac artery. Hypogastric artery and external iliac artery patent. Disease through the length of the external iliac artery. Common femoral artery in the proximal fill more 0 vasculature patent on today's  exam. Veins: Portal venous system remains patent. Unremarkable appearance of the systemic venous vasculature. Review of the MIP images confirms the above findings. NON-VASCULAR Lower chest: Respiratory motion somewhat limits evaluation of the lung bases. Coverage of the lower chest/ field-of-view larger on the current study. Plaque like thickening of the left sided pleura at the lung base. This has been present since the CT 11/26/2014 Coronary calcifications.  Aortic valve calcifications. Hepatobiliary: Morphology of the liver compatible with cirrhosis with enlarged left lobe, caudate lobe, and nodular configuration. No focal lesion identified. Pancreas: Unremarkable appearance of pancreas Spleen: Borderline enlarged spleen Adrenals/Urinary Tract: Unremarkable appearance of adrenal glands. Right: Severe renal cortical thinning. No hydronephrosis or nephrolithiasis. Hypodense lesion on the lateral cortex and at the superior cortex too small to characterize. Left: Severe renal cortical thinning. No hydronephrosis. Similar appearance of low-density cystic structures, the larger of which are compatible with simple cysts. Smaller are too small to characterize. Excreted contrast within the urinary bladder. Stomach/Bowel: Unremarkable stomach. Small hiatal hernia. No evidence of small bowel obstruction or transition point. Minimal thickening of small bowel wall, persists after the prior CT. Small bowel wall enhances on the arterial and venous phase, with no CT evidence of ischemia. Retained oral contrast within colon, limiting evaluation of the mucosal surface. No evidence of obstruction. Small stool burden. Diverticular disease. Normal appendix. Lymphatic: Low-density ascites within the pericolic gutter, right upper quadrant, and within the dependent pelvis associated with the peritoneal dialysis catheter. No enhancement of the surface is the peritoneum and. Vascular/lymphatic congestion within small bowel mesenteric,  similar to prior. Small lymph nodes, none of which are enlarged. Mesenteric: Fluid throughout the peritoneal of small volume. Small amount of anti dependent gas, similar to prior. Peritoneal dialysis catheter terminates within the low pelvis, adjacent to the rectum. Reproductive: Unremarkable appearance of the pelvic organs. Other: Small fat containing umbilical hernia. Musculoskeletal: Sclerotic appearance of the skeletal elements. No acute bony abnormality. IMPRESSION: Vascular: No acute vascular abnormality. Similar to yesterday's study there is diffuse atherosclerotic changes of the abdominal aorta, with involvement of the mesenteric vasculature. Again, there is likely greater than 50% stenosis at the celiac artery and IMA artery, however, CTA resolution difficult to make precise assessment. The degree of stenosis at the SMA origin is likely high-grade given the post stenotic dilation and degree of disease. Distal branches of the mesenteric vessels remain patent. Bilateral renal artery stenosis. Portal venous system unremarkable. Systemic venous system unremarkable. Nonvascular:  Morphology of the liver compatible with cirrhosis. Associated borderline splenomegaly, indicating developing portal hypertension. No evidence of bowel ischemia, with uniform enhancement of stomach and small bowel. Although the colon wall cannot be assessed with the presence of positive enteric contrast, there are no focal findings to suggest colonic ischemia. Temporal resolution of mild small bowel thickening is improved with 24 hour follow-up CT, with mild thickening of the small bowel in the mid abdomen. There is associated lymphatic congestion of the small bowel mesenteric with associated small lymph nodes. Finding is nonspecific. There is no evidence of venous obstruction per se, although these findings can be seen with portal colopathy/enteropathy in the setting of cirrhosis and developing portal hypertension. Alternative  consideration would be an nonspecific inflammatory or infectious enteritis. These results were called by telephone at the time of interpretation on 04/03/2016 at 8:56 am to Dr. Carles Collet, who verbally acknowledged these results. Small volume of low-density ascites, likely related to PD catheter. PD catheter within the dependent pelvis adjacent to rectum. No evidence of peritonitis on the contrast-enhanced exam. Persistent thickening of the left posterior lower pleura, unchanged from the CT 11/26/2014. This may reflect rounded atelectasis/scarring. Further evaluation with chest CT may be considered if the patient has a history of carcinoma. Coronary artery disease.  Aortic valve calcifications. Signed, Dulcy Fanny. Earleen Newport, DO [facility] Electronically Signed   By: Corrie Mckusick D.O.   On: 04/03/2016 08:59   Impression:  1.  Nausea and vomiting and diarrhea.  Essentially resolved.  Would seem most likely due to infectious gastroenteritis. 2.  Abdominal pain, now more periumbilical and right lower quadrant.  Confusing picture.  Patient has evidence of peritonitis on exam, and has elevated lactate and leukocytosis; however, no obvious perforated viscus or intraabdominal catastrophe seen on any of 3 CT scans in the past 5 days.  Secondary peritonitis from his peritoneal dialysis is certainly possible, but it is unusual that there would be no improvement despite decrease in fluid WBC and use of broad-spectrum antibiotics.  Patient has high-grade SMA stenosis as well as "at least" 50% stenosis of both IMA and celiac artery (further definitive characterization on CTA done 10/12 not possible); as a result of multivessel disease, chronic mesenteric ischemia with possible recent downstream showering of emboli is possible.  Dr. Trula Slade does not feel current symptoms are due to mesenteric ischemia based on his recent progress note from today.  I am not convinced of  intraabdominal perforation (e.g., from diverticulitis or appendicitis, etc.), given improvement of patient's peritoneal WBC as well as lack of any of pneumoperitoneum on three CT scans and lack of diverticulitis, appendicitis and so forth on these three scans as well.  Plan:  1.  Continue medical management with analgesics and antibiotics. 2.  NPO. 3.  If abdominal pain doesn't start to improve over the next 12-24 hours despite conservative management, I would consider General Surgery consult for consideration of exploratory laparoscopy. 4.  [facility] will follow.   LOS: 2 days   Haizley Cannella M  04/03/2016, 1:41 PM  Pager [phone] If no answer or after 5 PM call [phone]

## 2016-04-03 NOTE — Progress Notes (Signed)
    I have reviewed the patient's CT scan.  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  There is no evidence of intestinal ischemia.  With patency of all 3 mesenteric vessels despite stenosis, I would doubt that this is the etiology of his abdominal pain.  If this were the source of his abdominal pain, it would be associated with intestinal ischemia which does not appear to be present currently.  If the patient shows signs of chronic mesenteric ischemia once he is to his acute illness, I would consider angiography with intervention.  His symptoms would include postprandial abdominal pain and weight loss.  If there are any questions over the weekend please contact Dr. Servando Snare     V. Leia Alf, M.D. [facility] Office: [phone] Pager:  [phone]

## 2016-04-03 NOTE — Progress Notes (Signed)
PROGRESS NOTE  Victor Lane [id number] DOB: 10-15-1945 DOA: 04/01/2016 PCP: [name], MD  Brief History:  70 year old male with a history of ESRD on peritoneal dialysis, CAD, diabetes mellitus type 2, carotid stenosis, aortic stenosis, GERD, laryngeal cancer presented with abdominal pain that began on 03/27/2016. The patient had associated nausea, vomiting, and diarrhea for nearly one week prior to development of his worsening abdominal pain.  After this, he had an episode with PD outflow blockage due to fibrin clot which his PD center was able to sort out on 10/6 and get him drained (he reports 10L drained). The next evening, he developed severe lower abdominal pain. He came to the ED on 10/7 for evaluation. Abdominal CT was neg for acute findings. PD Cx, gram stain, cell count collected. He was found to have increased cell count in PD fluid (1196 WBC), concerning for peritonitis. He was started on Vanc/Fortaz and discharged.  On 10/9, he went to his PD unit as directed. Repeat cell count, Cx, gram stain collected and he was re-dosed with Vanc and [place]. Cx pending, but cell count down to 292. overnight 10/10, while on his cycler, he developed recurrent severe lower abdominal pain and presented back to the ED for evaluation. Pain is constant, located in mid-lower abdomen. No N/V/diarrhea at this time.   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, celiac origin, IMA -Continue medical management with antibiotics, pain control, gradual diet advancement -03/28/2016 and  04/01/2016 CTs of the abdomen negative for abscess -04/02/16 CT angio abd--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, celiac origin, IMA unchanged--?SB thickening related to PD fluid/cirrhosis -soap suds enema--no effect with miralax/bisacodyl -consult GI--pt/wife state pt has had abd pain in past for which he has seen GI (Outlaw)  Peritonitis--peritoneal dialysis related -PD fluid cultures have been negative -WBC trending down on serial PD fluid analyses -04/01/2016 PD fluid WBC 129 -Continue vancomycin and ceftazidime IV -Appreciate nephrology follow-up -follow PD fluid and blood cultures--neg to date  Acute toxic encephalopathy -Suspect this is related to the patient's opioids -Patient will have fine balance between pain control and confusion -04/03/16--appears more alert   ESRD on PD -appreciate nephrology followup  HTN -d/c'ed am dose lisinopril due to soft BP -d/c'ed amlodipine due to soft BP -continue metoprolol succinate and pm lisinopril  Chronic systolic CHF -[id number] echo EF 30-35% -Clinically compensated  Diabetes mellitus type 2 -04/01/2016 hemoglobin A1c 5.5 -CBGs well-controlled -Continue NovoLog sliding scale    Disposition Plan:  Not stable for d/c Family Communication:   wife updated at bedside 04/03/16--Total time spent 35 minutes.  Greater than 50% spent face to face counseling and coordinating care.   Consultants:  Nephrology, VVS  Code Status:  FULL  DVT Prophylaxis:  SCD   Procedures: As Listed in Progress Note Above  Antibiotics: Vancomycin 10/7>>> Ceftazidime 10/7>>>    Subjective: Patient continues to complain of abdominal pain mostly right sided but denies any fevers, chills, chest pain, shortness breath, nausea, vomiting, headache, neck pain. No bowel movement in past 1 week  Objective: Vitals:   04/03/16 0036 04/03/16 0132 04/03/16 0200 04/03/16 0401  BP: 98/71 (!) 101/45 (!) 148/64 122/63  Pulse:  87 (!) 102 92  Resp:  12 (!) 21   Temp: 97.5 F (36.4 C)   98.3 F (36.8 C)  TempSrc: Oral   Oral  SpO2:  96% 94%   Weight:      Height:        Intake/Output Summary (Last 24 hours) at 04/03/16 1031 Last data filed at 04/03/16 0403  Gross per 24 hour  Intake             1050 ml  Output              500 ml  Net              550 ml   Weight change: -0.621 kg (-1 lb 5.9 oz) Exam:   General:  Pt is alert, follows commands appropriately, not in acute distress  HEENT: No icterus, No thrush, No neck mass, [place]/AT  Cardiovascular: RRR, S1/S2, no rubs, no gallops  Respiratory: CTA bilaterally, no wheezing, no crackles, no rhonchi  Abdomen: Soft/+BS, RUQ, RLQ pain, non distended, no guarding  Extremities: No edema, No lymphangitis, No petechiae, No rashes, no synovitis   Data Reviewed: I have personally reviewed following labs and imaging studies Basic Metabolic Panel:  Recent Labs Lab 03/28/16 1942 04/01/16 0507 04/02/16 0217  NA 133* 134* 132*  K 2.9* 2.5* 3.2*  CL 88* 90* 93*  CO2 28 26 24   GLUCOSE 98 164* 111*  BUN 43* 25* 33*  CREATININE 9.59* 8.45* 9.96*  CALCIUM 9.0 9.3 8.6*   Liver Function Tests:  Recent Labs Lab 03/28/16 1942 04/01/16 0507 04/02/16 0217  AST 17 23 23   ALT 23 24 22   ALKPHOS 100 107 96  BILITOT 0.2* 0.6 0.4  PROT 6.6 6.7 5.6*  ALBUMIN 2.5* 2.5* 2.1*    Recent Labs Lab 03/28/16 1942 04/02/16 0814  LIPASE 102* 21  AMYLASE  --  82   No results for input(s): AMMONIA in the last 168 hours. Coagulation Profile:  Recent Labs Lab 04/02/16 0217  INR 1.23   CBC:  Recent Labs Lab 03/28/16 1942 04/01/16 0507 04/02/16 0217  WBC 12.5* 10.4 14.5*  NEUTROABS  --  7.9*  --   HGB 10.8* 11.3* 10.7*  HCT 34.5* 35.7* 34.2*  MCV 107.5* 106.6* 105.9*  PLT 314 390 302   Cardiac Enzymes: No results for input(s): CKTOTAL, CKMB, CKMBINDEX, TROPONINI in the last 168 hours. BNP: Invalid input(s): POCBNP CBG:  Recent Labs Lab 04/02/16 0743  04/02/16 1144 04/02/16 1817 04/02/16 2104 04/03/16 0834  GLUCAP 126* 99 149* 125* 153*   HbA1C:  Recent Labs  04/01/16 1306  HGBA1C 5.5   Urine analysis:    Component Value Date/Time   COLORURINE YELLOW 08/07/2013 1855   APPEARANCEUR CLOUDY (A) 08/07/2013 1855   LABSPEC 1.020 08/07/2013 1855   PHURINE 7.5 08/07/2013 1855   GLUCOSEU 100 (A) 08/07/2013 1855   HGBUR MODERATE (A) 08/07/2013 1855   BILIRUBINUR SMALL (A) 08/07/2013 1855   KETONESUR NEGATIVE 08/07/2013 1855   PROTEINUR >300 (A) 08/07/2013 1855   UROBILINOGEN 1.0 08/07/2013 1855   NITRITE NEGATIVE 08/07/2013 1855   LEUKOCYTESUR TRACE (A) 08/07/2013 1855   Sepsis Labs: @LABRCNTIP (procalcitonin:4,lacticidven:4) ) Recent Results (from the past 240 hour(s))  Gram stain     Status: None   Collection Time: 03/29/16  1:12 AM  Result Value Ref Range Status   Specimen Description FLUID  Final   Special Requests PERITONEAL  Final   Gram Stain   Final    CYTOSPIN SMEAR WBC PRESENT,BOTH PMN AND  MONONUCLEAR NO ORGANISMS SEEN    Report Status 03/29/2016 FINAL  Final  Culture, body fluid-bottle     Status: None (Preliminary result)   Collection Time: 03/29/16  1:12 AM  Result Value Ref Range Status   Specimen Description FLUID  Final   Special Requests PERITONEAL BOTTLES DRAWN AEROBIC AND ANAEROBIC  Final   Culture NO GROWTH 4 DAYS  Final   Report Status PENDING  Incomplete  Culture, blood (Routine x 2)     Status: None (Preliminary result)   Collection Time: 04/01/16  5:07 AM  Result Value Ref Range Status   Specimen Description BLOOD RIGHT ARM  Final   Special Requests BOTTLES DRAWN AEROBIC AND ANAEROBIC 5ML  Final   Culture NO GROWTH 1 DAY  Final   Report Status PENDING  Incomplete  Culture, blood (Routine x 2)     Status: None (Preliminary result)   Collection Time: 04/01/16  5:50 AM  Result Value Ref Range Status   Specimen Description BLOOD RIGHT HAND  Final   Special Requests AEROBIC BOTTLE ONLY 5ML   Final   Culture NO GROWTH 1 DAY  Final   Report Status PENDING  Incomplete  Culture, body fluid-bottle     Status: None (Preliminary result)   Collection Time: 04/01/16  8:12 AM  Result Value Ref Range Status   Specimen Description PERITONEAL  Final   Special Requests NONE  Final   Culture NO GROWTH 1 DAY  Final   Report Status PENDING  Incomplete  Gram stain     Status: None   Collection Time: 04/01/16  8:12 AM  Result Value Ref Range Status   Specimen Description PERITONEAL  Final   Special Requests NONE  Final   Gram Stain   Final    FEW WBC PRESENT, PREDOMINANTLY PMN NO ORGANISMS SEEN    Report Status 04/01/2016 FINAL  Final  MRSA PCR Screening     Status: None   Collection Time: 04/01/16  5:49 PM  Result Value Ref Range Status   MRSA by PCR NEGATIVE NEGATIVE Final    Comment:        The GeneXpert MRSA Assay (FDA approved for NASAL specimens only), is one component of a comprehensive MRSA colonization surveillance program. It is not intended to diagnose MRSA infection nor to guide or monitor treatment for MRSA infections.      Scheduled Meds: . calcium acetate  1,334 mg Oral BID WC  . calcium acetate  667 mg Oral Q supper  . cefTAZidime (FORTAZ)  IV  1 g Intravenous Q24H  . chlorhexidine  15 mL Mouth/Throat QHS  . dialysis solution 1.5% low-MG/low-CA   Intraperitoneal Q24H  . dialysis solution 1.5% low-MG/low-CA   Intraperitoneal Q24H  . gabapentin  100 mg Oral BID  . gentamicin cream  1 application Topical Daily  . insulin aspart  0-9 Units Subcutaneous TID WC  . ipratropium  2 spray Each Nare BID  . lisinopril  20 mg Oral QHS  . metoprolol succinate  50 mg Oral BID  . multivitamin  1 tablet Oral Daily  . pantoprazole  40 mg Oral Daily  . polyethylene glycol  17 g Oral Daily  . rosuvastatin  5 mg Oral Once per day on Mon Wed Fri  . sodium chloride flush  3 mL Intravenous Q12H   Continuous Infusions:   Procedures/Studies: Dg Chest 2 View  Result Date:  04/01/2016 CLINICAL DATA:  Suspected sepsis.  Peritoneal dialysis patient. EXAM: CHEST  2 VIEW  COMPARISON:  Chest radiograph 07/19/2015.  Abdominal CT 03/28/2016 FINDINGS: Stable cardiomegaly and mediastinal contours. There is thoracic aortic atherosclerosis. Small bilateral pleural effusions and bibasilar airspace disease. No pulmonary edema. No pneumothorax. Vascular stent in the left axilla. Free air under the right hemidiaphragm, likely related to peritoneal dialysis. Resorption of the distal right clavicle. IMPRESSION: Chronic cardiomegaly, stable.  Thoracic aortic atherosclerosis. Small bilateral pleural effusions with bibasilar airspace disease, likely atelectasis. Electronically Signed   By: Jeb Levering M.D.   On: 04/01/2016 05:40   Ct Abdomen Pelvis W Contrast  Result Date: 03/29/2016 CLINICAL DATA:  Acute onset of generalized abdominal pain. Recent peritoneal dialysis. Vomiting and diarrhea. Initial encounter. EXAM: CT ABDOMEN AND PELVIS WITH CONTRAST TECHNIQUE: Multidetector CT imaging of the abdomen and pelvis was performed using the standard protocol following bolus administration of intravenous contrast. CONTRAST:  163mL ISOVUE-300 IOPAMIDOL (ISOVUE-300) INJECTION 61% COMPARISON:  CT of the abdomen and pelvis from 11/26/2014 FINDINGS: Lower chest: Trace right-sided pleural fluid is noted. Bibasilar atelectasis or scarring is seen. The visualized portions of the mediastinum are unremarkable. Hepatobiliary: A small amount of ascites is seen tracking about the liver. The liver is unremarkable in appearance. The patient is status post cholecystectomy, with clips noted at the gallbladder fossa. The common bile duct remains normal in caliber. Pancreas: The pancreas is within normal limits. Spleen: The spleen is unremarkable in appearance. Adrenals/Urinary Tract: The adrenal glands are grossly unremarkable in appearance. Severe chronic bilateral renal atrophy is noted, with scattered bilateral renal  cysts. There is no evidence of hydronephrosis. No renal or ureteral stones are identified. No significant perinephric stranding is seen. Stomach/Bowel: The stomach is unremarkable in appearance. The small bowel is within normal limits. The appendix is normal in caliber, without evidence of appendicitis. Scattered diverticulosis is noted along the distal descending and sigmoid colon, without definite evidence of diverticulitis. Vascular/Lymphatic: Diffuse calcification is seen along the abdominal aorta and its branches. There is relatively severe luminal narrowing at the proximal right common iliac artery, and moderate luminal narrowing along the left common iliac artery. Relatively severe luminal narrowing is also noted along the proximal left superficial femoral artery. No retroperitoneal or pelvic sidewall lymphadenopathy is seen. Reproductive: The bladder is mildly distended and grossly unremarkable. The prostate remains normal in size. Other: The patient's peritoneal dialysis catheter is noted within the pelvis, with a small to moderate amount of free fluid in the pelvis and along both paracolic gutters. Musculoskeletal: No acute osseous abnormalities are identified. The visualized musculature is unremarkable in appearance. IMPRESSION: 1. No acute abnormality seen to explain the patient's symptoms. 2. Status post recent peritoneal dialysis. Small to moderate volume ascites noted within the abdomen and pelvis. 3. Diffuse aortic atherosclerosis noted. Relatively severe luminal narrowing at the proximal right common iliac artery, and moderate luminal narrowing along the left common iliac artery. Relatively severe luminal narrowing also noted along the proximal left superficial femoral artery. 4. Severe chronic bilateral renal atrophy, with scattered bilateral renal cysts. 5. Scattered diverticulosis along the distal descending and sigmoid colon, without definite evidence of diverticulitis. 6. Trace right-sided  pleural fluid. Bibasilar atelectasis or scarring noted. Electronically Signed   By: Garald Balding M.D.   On: 03/29/2016 00:10   Ct Angio Abdomen Pelvis  W &/or Wo Contrast  Result Date: 04/03/2016 CLINICAL DATA:  70 year old male with abdominal pain. Negative lactic acidosis. Renal failure with peritoneal dialysis catheter. EXAM: CT ANGIOGRAPHY  ABDOMEN AND PELVIS TECHNIQUE: Multidetector CT imaging through the abdomen and pelvis  was performed using the standard protocol during bolus administration of intravenous contrast. Delayed images included. Multiplanar reconstructed images including MIPs were obtained and reviewed to evaluate the vascular anatomy. CONTRAST:  100 cc Isovue 370 COMPARISON:  CT angiogram done the previous day, CT 03/28/2016, CT 11/26/2014 FINDINGS: VASCULAR Aorta: Extensive mixed calcified and soft plaque of the lower thoracic and abdominal aorta. No aneurysm. No dissection. No periaortic fluid. Celiac: Similar appearance of atherosclerotic changes at the origin of the celiac artery, which is patent though perhaps with 50% or greater stenosis. Post stenotic dilation. Typical branch pattern again noted, discussed on the prior CT. SMA: Similar stenotic appearance of the superior mesenteric artery with mixed calcified and soft plaque. Greater than 50% stenosis. Distal branches appear patent. Renals: Calcified and soft plaque at the origin the bilateral renal arteries. Single bilateral renal arteries. IMA: Patent IMA. Likely stenosis at the origin. Difficult to quantify degree of stenosis. Right lower extremity: Mixed calcified and soft plaque of the right iliac system. Again, likely greater than 50% stenosis in the proximal right common iliac artery. Hypogastric artery and external iliac artery patent. Atherosclerotic disease throughout the external iliac artery. Common femoral artery in the proximal full more all vessels patent. Left lower extremity: Mixed calcified and soft plaque of the  left iliac system. Likely stenosis at the distal common iliac artery. Hypogastric artery and external iliac artery patent. Disease through the length of the external iliac artery. Common femoral artery in the proximal fill more 0 vasculature patent on today's exam. Veins: Portal venous system remains patent. Unremarkable appearance of the systemic venous vasculature. Review of the MIP images confirms the above findings. NON-VASCULAR Lower chest: Respiratory motion somewhat limits evaluation of the lung bases. Coverage of the lower chest/ field-of-view larger on the current study. Plaque like thickening of the left sided pleura at the lung base. This has been present since the CT 11/26/2014 Coronary calcifications.  Aortic valve calcifications. Hepatobiliary: Morphology of the liver compatible with cirrhosis with enlarged left lobe, caudate lobe, and nodular configuration. No focal lesion identified. Pancreas: Unremarkable appearance of pancreas Spleen: Borderline enlarged spleen Adrenals/Urinary Tract: Unremarkable appearance of adrenal glands. Right: Severe renal cortical thinning. No hydronephrosis or nephrolithiasis. Hypodense lesion on the lateral cortex and at the superior cortex too small to characterize. Left: Severe renal cortical thinning. No hydronephrosis. Similar appearance of low-density cystic structures, the larger of which are compatible with simple cysts. Smaller are too small to characterize. Excreted contrast within the urinary bladder. Stomach/Bowel: Unremarkable stomach. Small hiatal hernia. No evidence of small bowel obstruction or transition point. Minimal thickening of small bowel wall, persists after the prior CT. Small bowel wall enhances on the arterial and venous phase, with no CT evidence of ischemia. Retained oral contrast within colon, limiting evaluation of the mucosal surface. No evidence of obstruction. Small stool burden. Diverticular disease. Normal appendix. Lymphatic:  Low-density ascites within the pericolic gutter, right upper quadrant, and within the dependent pelvis associated with the peritoneal dialysis catheter. No enhancement of the surface is the peritoneum and. Vascular/lymphatic congestion within small bowel mesenteric, similar to prior. Small lymph nodes, none of which are enlarged. Mesenteric: Fluid throughout the peritoneal of small volume. Small amount of anti dependent gas, similar to prior. Peritoneal dialysis catheter terminates within the low pelvis, adjacent to the rectum. Reproductive: Unremarkable appearance of the pelvic organs. Other: Small fat containing umbilical hernia. Musculoskeletal: Sclerotic appearance of the skeletal elements. No acute bony abnormality. IMPRESSION: Vascular: No acute vascular abnormality. Similar  to yesterday's study there is diffuse atherosclerotic changes of the abdominal aorta, with involvement of the mesenteric vasculature. Again, there is likely greater than 50% stenosis at the celiac artery and IMA artery, however, CTA resolution difficult to make precise assessment. The degree of stenosis at the SMA origin is likely high-grade given the post stenotic dilation and degree of disease. Distal branches of the mesenteric vessels remain patent. Bilateral renal artery stenosis. Portal venous system unremarkable. Systemic venous system unremarkable. Nonvascular: Morphology of the liver compatible with cirrhosis. Associated borderline splenomegaly, indicating developing portal hypertension. No evidence of bowel ischemia, with uniform enhancement of stomach and small bowel. Although the colon wall cannot be assessed with the presence of positive enteric contrast, there are no focal findings to suggest colonic ischemia. Temporal resolution of mild small bowel thickening is improved with 24 hour follow-up CT, with mild thickening of the small bowel in the mid abdomen. There is associated lymphatic congestion of the small bowel mesenteric  with associated small lymph nodes. Finding is nonspecific. There is no evidence of venous obstruction per se, although these findings can be seen with portal colopathy/enteropathy in the setting of cirrhosis and developing portal hypertension. Alternative consideration would be an nonspecific inflammatory or infectious enteritis. These results were called by telephone at the time of interpretation on 04/03/2016 at 8:56 am to Dr. Carles Collet, who verbally acknowledged these results. Small volume of low-density ascites, likely related to PD catheter. PD catheter within the dependent pelvis adjacent to rectum. No evidence of peritonitis on the contrast-enhanced exam. Persistent thickening of the left posterior lower pleura, unchanged from the CT 11/26/2014. This may reflect rounded atelectasis/scarring. Further evaluation with chest CT may be considered if the patient has a history of carcinoma. Coronary artery disease.  Aortic valve calcifications. Signed, Dulcy Fanny. Earleen Newport, DO [facility] Radiology Electronically Signed   By: Corrie Mckusick D.O.   On: 04/03/2016 08:59   Ct Angio Abd/pel W And/or Wo Contrast  Result Date: 04/01/2016 CLINICAL DATA:  70 year old male with a history of lower abdominal pain and nausea vomiting. Constipation. EXAM: CTA ABDOMEN AND PELVIS WITH CONTRAST TECHNIQUE: Multidetector CT imaging of the abdomen and pelvis was performed using the standard protocol during bolus administration of intravenous contrast. Multiplanar reconstructed images and MIPs were obtained and reviewed to evaluate the vascular anatomy. CONTRAST:  75 cc Isovue 370 COMPARISON:  CT 03/28/2016, 11/26/2014, 08/06/2013 FINDINGS: VASCULAR Aorta: Diffuse atherosclerosis of the abdominal aorta with soft plaque and calcified plaque. No dissection. No aneurysm. No periaortic fluid. Celiac: Atherosclerotic changes at the origin of the celiac artery which is patent. Post stenotic dilation.  Typical branch pattern of the celiac artery, with left gastric, common hepatic, and splenic artery identified. SMA: Atherosclerotic changes of the superior mesenteric artery, with mixed calcified and soft plaque at the origin. The SMA appears patent, with greater than 50% stenosis. Renals: Dense calcified and soft plaque at the origin the bilateral renal arteries. Renal arteries are patent. Single left and single right renal artery. IMA: Inferior mesenteric artery is patent, though appears stenotic at the origin. Left colic artery patent. Superior rectal artery patent. Right lower extremity: Dense calcified and soft plaque involving the right iliac system, including common iliac artery and external iliac artery. Small caliber vessels, with likely multifocal stenoses. Greatest degree of stenosis may be at the distal common iliac artery above the hypogastric origin. Hypogastric artery is patent. Common femoral artery patent. Proximal SFA and profunda femoris patent. Left lower extremity:  Mixed calcified and soft plaque of the left iliac system. Greatest degree of stenosis at the origin of the external iliac artery. Hypogastric artery patent. Common femoral artery patent. Proximal SFA and profunda femoris patent. Veins: Unremarkable appearance of the venous system. Review of the MIP images confirms the above findings. NON-VASCULAR Lower chest: Atelectatic changes at the lung bases. Small hiatal hernia. Hepatobiliary: Morphologic changes of the liver compatible with cirrhosis with developing left hepatic enlargement. Nodular contour. No focal lesion identified on the current CT. No intrahepatic or extrahepatic biliary ductal dilatation. Cholecystectomy Pancreas: Unremarkable appearance of the pancreas. No pericholecystic fluid or inflammatory changes. Unremarkable ductal system. Spleen: Borderline splenomegaly. Adrenals/Urinary Tract: Unremarkable appearance of adrenal glands. Right: No hydronephrosis. Advanced cortical  renal thinning. Symmetric perfusion to the left. Hypodense lesions within the cortex of the right kidney, the majority are too small to characterize though arm most likely benign cysts. Left: No hydronephrosis. Advanced cortical renal thinning come symmetric to the right. Symmetric perfusion. Cystic lesions in the cortex of the left kidney, most likely all benign cysts although the majority are too small to characterize. Unremarkable appearance of the urinary bladder with high density excreted contrast. Stomach/Bowel: Unremarkable appearance of the stomach. Small hiatal hernia. Unremarkable appearance of small bowel. Venous phase demonstrates enhancement of the small bowel and colon, although the colon somewhat limited by the presence of enteric contrast. Colonic diverticula. Lymphatic: Multiple lymph nodes in the para-aortic nodal station, none of which are enlarged. Mesenteric: Low-density free fluid within the abdomen, associated with peritoneal dialysis catheter. Small volume of free air in the anti dependent aspect. Reproductive: Unremarkable appearance of the prostate and pelvic organs. Other: Small fat containing umbilical hernia. Unremarkable appearance of the tract of the dialysis catheter. Musculoskeletal: No evidence of acute fracture. Developing sclerotic appearance of the skeletal structures, potentially early renal osteodystrophy. No bony canal narrowing. No significant degenerative changes of the hips. IMPRESSION: VASCULAR No acute vascular abnormality identified. Advanced aortic atherosclerosis. There are associated changes at the origin of all mesenteric vessel, though they remain patent. There may be 50 percent stenosis at the celiac artery origin, with greater than 50 percent stenosis of the superior mesenteric artery origin, and potentially greater than 50 percent stenosis at the origin of the IMA, although degree of stenosis cannot be determined by CTA. This pattern of disease certainly places  the patient at risk for chronic mesenteric ischemia. Correlation with patient's history may be useful, and potentially mesenteric duplex exam if there is concern for compromise. Bilateral renal artery stenosis, potentially contributing to advanced renal cortical thinning. NON-VASCULAR Morphologic changes of the liver compatible with cirrhosis. Referral for establishing gastroenterology care may be considered if not already established, as well as consideration of initiating surveillance for hepatocellular carcinoma. Peritoneal dialysis catheter with free fluid likely associated with treatment. Tiny amount of anti dependent gas within the abdomen, again likely associated with the peritoneal dialysis. Again, bilateral renal cortical cysts. While these are most likely benign, there is incomplete characterization given the size. Developing renal osteodystrophy. Signed, Dulcy Fanny. Earleen Newport, DO [facility] Electronically Signed   By: Corrie Mckusick D.O.   On: 04/01/2016 09:00    Arilla Hice, DO  [facility] Pager [phone]  If 7PM-7AM, please contact night-coverage [email or web address] Password [email or web address] 04/03/2016, 10:31 AM   LOS: 2 days

## 2016-04-03 NOTE — Evaluation (Signed)
Physical Therapy Evaluation Patient Details Name: Victor Lane MRN: [id number] DOB: [birth year] Today's Date: 04/03/2016   History of Present Illness  70 year old male with a history of ESRD on peritoneal dialysis, CAD, diabetes mellitus type 2, carotid stenosis, aortic stenosis, GERD, laryngeal cancer presented with abdominal pain that began on 03/27/2016  Clinical Impression  Patient demonstrates deficits in functional mobility as indicated below. Will need continued skilled PT to address deficits and maixmize function. Will see as indicated and progress as tolerated.     Follow Up Recommendations Home health PT;Supervision/Assistance - 24 hour    Equipment Recommendations  None recommended by PT    Recommendations for Other Services       Precautions / Restrictions Precautions Precautions: Fall Restrictions Weight Bearing Restrictions: No      Mobility  Bed Mobility Overal bed mobility: Needs Assistance Bed Mobility: Supine to Sit     Supine to sit: Min assist     General bed mobility comments: min assist with UE pull to sit at EOB, assist to rotate hips to EOB  Transfers Overall transfer level: Needs assistance Equipment used: Rolling walker (2 wheeled) Transfers: Sit to/from Stand Sit to Stand: Min assist         General transfer comment: min assist for stability  Ambulation/Gait Ambulation/Gait assistance: Min guard Ambulation Distance (Feet): 140 Feet Assistive device: Rolling walker (2 wheeled) Gait Pattern/deviations: Step-through pattern;Decreased stride length;Drifts right/left;Trunk flexed Gait velocity: decreased   General Gait Details: some instability noted intermittently, use of RW for enegy conservation and safety  Stairs            Wheelchair Mobility    Modified Rankin (Stroke Patients Only)       Balance Overall balance assessment: Needs assistance;History of Falls           Standing balance-Leahy Scale: Fair                               Pertinent Vitals/Pain Pain Assessment: Faces Faces Pain Scale: Hurts even more Pain Location: abdominal region Pain Descriptors / Indicators: Discomfort;Grimacing;Guarding Pain Intervention(s): Monitored during session;Premedicated before session    Home Living Family/patient expects to be discharged to:: Private residence Living Arrangements: Spouse/significant other Available Help at Discharge: Family Type of Home: House Home Access: Stairs to enter Entrance Stairs-Rails: Right Entrance Stairs-Number of Steps: 5 Home Layout: One level Home Equipment: Environmental consultant - 2 wheels;Cane - single point;Toilet riser;Grab bars - tub/shower      Prior Function Level of Independence: Needs assistance   Gait / Transfers Assistance Needed: requires intermittent assist from wife for mobility           Hand Dominance   Dominant Hand: Right    Extremity/Trunk Assessment   Upper Extremity Assessment: Generalized weakness           Lower Extremity Assessment: Generalized weakness         Communication   Communication: No difficulties  Cognition Arousal/Alertness: Awake/alert Behavior During Therapy: Flat affect Overall Cognitive Status: Impaired/Different from baseline Area of Impairment: Awareness;Safety/judgement;Problem solving;Memory     Memory: Decreased short-term memory   Safety/Judgement: Decreased awareness of safety;Decreased awareness of deficits Awareness: Emergent Problem Solving: Slow processing;Difficulty sequencing;Requires verbal cues;Requires tactile cues      General Comments      Exercises     Assessment/Plan    PT Assessment Patient needs continued PT services  PT Problem List Decreased strength;Decreased activity tolerance;Decreased balance;Decreased mobility;Decreased  cognition;Cardiopulmonary status limiting activity;Pain          PT Treatment Interventions DME instruction;Gait training;Functional  mobility training;Therapeutic activities;Therapeutic exercise;Balance training;Cognitive remediation;Patient/family education    PT Goals (Current goals can be found in the Care Plan section)  Acute Rehab PT Goals Patient Stated Goal: to go home PT Goal Formulation: With patient/family Time For Goal Achievement: 04/17/16 Potential to Achieve Goals: Good    Frequency Min 3X/week   Barriers to discharge        Co-evaluation               End of Session Equipment Utilized During Treatment: Gait belt Activity Tolerance: Patient tolerated treatment well;Patient limited by fatigue Patient left: in chair;with call bell/phone within reach (chair alarm pad in position, no alarm box available nsg awar) Nurse Communication: Mobility status (no alarm box)         Time: 1546-1610 PT Time Calculation (min) (ACUTE ONLY): 24 min   Charges:   PT Evaluation $PT Eval Moderate Complexity: 1 Procedure PT Treatments $Gait Training: 8-22 mins   PT G CodesDuncan Dull 21-Apr-2016, 5:40 PM  Alben Deeds, [place] DPT  [phone]

## 2016-04-03 NOTE — Progress Notes (Signed)
[facility] Rounding Note Subjective/Interval History:   No technical issues with his PD yesterday Used all 1.5% dialysate Weight stable Pain seems more localized to R lower abd now Not eating Just had enema - hoping for results CT angio 10/12 no change or new information  Objective Vital signs in last 24 hours: Vitals:   04/03/16 0036 04/03/16 0132 04/03/16 0200 04/03/16 0401  BP: 98/71 (!) 101/45 (!) 148/64 122/63  Pulse:  87 (!) 102 92  Resp:  12 (!) 21   Temp: 97.5 F (36.4 C)   98.3 F (36.8 C)  TempSrc: Oral   Oral  SpO2:  96% 94%   Weight:      Height:       Weight change: -0.621 kg (-1 lb 5.9 oz)  Intake/Output Summary (Last 24 hours) at 04/03/16 1205 Last data filed at 04/03/16 0826  Gross per 24 hour  Intake             1050 ml  Output              500 ml  Net              550 ml   Physical Exam:  Blood pressure 122/63, pulse 92, temperature 98.3 F (36.8 C), temperature source Oral, resp. rate (!) 21, height 5\' 10"  (1.778 m), weight 80.8 kg (178 lb 2.1 oz), SpO2 94 %.  Up in chair - eyes closed and doesn't want to talk - wife compensates for him Just had pain meds Regular rhythm S1S2 no S3 1/6 murmur LSB Abdomen tender, more so now right side PD cath site OK No LE edema. Dialysis Access: PD cath in mid L abdomen.  Labs: no labs done today Basic Metabolic Panel:  Recent Labs Lab 03/28/16 1942 04/01/16 0507 04/02/16 0217  NA 133* 134* 132*  K 2.9* 2.5* 3.2*  CL 88* 90* 93*  CO2 28 26 24   GLUCOSE 98 164* 111*  BUN 43* 25* 33*  CREATININE 9.59* 8.45* 9.96*  CALCIUM 9.0 9.3 8.6*     Recent Labs Lab 03/28/16 1942 04/01/16 0507 04/02/16 0217  AST 17 23 23   ALT 23 24 22   ALKPHOS 100 107 96  BILITOT 0.2* 0.6 0.4  PROT 6.6 6.7 5.6*  ALBUMIN 2.5* 2.5* 2.1*    Recent Labs Lab 03/28/16 1942 04/02/16 0814  LIPASE 102* 21  AMYLASE  --  82   CBC:  Recent Labs Lab 03/28/16 1942 04/01/16 0507 04/02/16 0217  WBC 12.5* 10.4 14.5*  NEUTROABS   --  7.9*  --   HGB 10.8* 11.3* 10.7*  HCT 34.5* 35.7* 34.2*  MCV 107.5* 106.6* 105.9*  PLT 314 390 302    Recent Labs Lab 04/02/16 1144 04/02/16 1817 04/02/16 2104 04/03/16 0834 04/03/16 1147  GLUCAP 99 149* 125* 153* 88   Results for Victor Lane (MRN [id number]) as of 04/01/2016 16:42  03/29/2016 01:12 04/01/2016 08:12  Monocyte-Macrophage-Serous Fluid 18 (L) 6 (L)  Other Cells, Fluid  FEW PYKNOTIC NEUT...  Albumin, Fluid  <1.0  Fluid Type-FALB  PERITONEAL CAVITY  Glucose, Peritoneal Fluid  516  Fluid Type-FLDH  PERITONEAL CAVITY  LD, Fluid  41 (H)  Total protein, fluid  <3.0  Fluid Type-FCT PERITONEAL Peritoneal  Color, Fluid STRAW (A) COLORLESS (A)  WBC, Fluid 1,196 (H) 129  Lymphs, Fluid 5 1  Eos, Fluid 43 37  Appearance, Fluid HAZY (A) CLEAR  Neutrophil Count, Fluid 34 (H) 56 (H)    Studies/Results: Ct Angio  Abdomen Pelvis  W &/or Wo Contrast  Result Date: 04/03/2016 CLINICAL DATA:  70 year old male with abdominal pain. Negative lactic acidosis. Renal failure with peritoneal dialysis catheter. EXAM: CT ANGIOGRAPHY  ABDOMEN AND PELVIS TECHNIQUE: Multidetector CT imaging through the abdomen and pelvis was performed using the standard protocol during bolus administration of intravenous contrast. Delayed images included. Multiplanar reconstructed images including MIPs were obtained and reviewed to evaluate the vascular anatomy. CONTRAST:  100 cc Isovue 370 COMPARISON:  CT angiogram done the previous day, CT 03/28/2016, CT 11/26/2014 FINDINGS: VASCULAR Aorta: Extensive mixed calcified and soft plaque of the lower thoracic and abdominal aorta. No aneurysm. No dissection. No periaortic fluid. Celiac: Similar appearance of atherosclerotic changes at the origin of the celiac artery, which is patent though perhaps with 50% or greater stenosis. Post stenotic dilation. Typical branch pattern again noted, discussed on the prior CT. SMA: Similar stenotic appearance of the  superior mesenteric artery with mixed calcified and soft plaque. Greater than 50% stenosis. Distal branches appear patent. Renals: Calcified and soft plaque at the origin the bilateral renal arteries. Single bilateral renal arteries. IMA: Patent IMA. Likely stenosis at the origin. Difficult to quantify degree of stenosis. Right lower extremity: Mixed calcified and soft plaque of the right iliac system. Again, likely greater than 50% stenosis in the proximal right common iliac artery. Hypogastric artery and external iliac artery patent. Atherosclerotic disease throughout the external iliac artery. Common femoral artery in the proximal full more all vessels patent. Left lower extremity: Mixed calcified and soft plaque of the left iliac system. Likely stenosis at the distal common iliac artery. Hypogastric artery and external iliac artery patent. Disease through the length of the external iliac artery. Common femoral artery in the proximal fill more 0 vasculature patent on today's exam. Veins: Portal venous system remains patent. Unremarkable appearance of the systemic venous vasculature. Review of the MIP images confirms the above findings. NON-VASCULAR Lower chest: Respiratory motion somewhat limits evaluation of the lung bases. Coverage of the lower chest/ field-of-view larger on the current study. Plaque like thickening of the left sided pleura at the lung base. This has been present since the CT 11/26/2014 Coronary calcifications.  Aortic valve calcifications. Hepatobiliary: Morphology of the liver compatible with cirrhosis with enlarged left lobe, caudate lobe, and nodular configuration. No focal lesion identified. Pancreas: Unremarkable appearance of pancreas Spleen: Borderline enlarged spleen Adrenals/Urinary Tract: Unremarkable appearance of adrenal glands. Right: Severe renal cortical thinning. No hydronephrosis or nephrolithiasis. Hypodense lesion on the lateral cortex and at the superior cortex too small to  characterize. Left: Severe renal cortical thinning. No hydronephrosis. Similar appearance of low-density cystic structures, the larger of which are compatible with simple cysts. Smaller are too small to characterize. Excreted contrast within the urinary bladder. Stomach/Bowel: Unremarkable stomach. Small hiatal hernia. No evidence of small bowel obstruction or transition point. Minimal thickening of small bowel wall, persists after the prior CT. Small bowel wall enhances on the arterial and venous phase, with no CT evidence of ischemia. Retained oral contrast within colon, limiting evaluation of the mucosal surface. No evidence of obstruction. Small stool burden. Diverticular disease. Normal appendix. Lymphatic: Low-density ascites within the pericolic gutter, right upper quadrant, and within the dependent pelvis associated with the peritoneal dialysis catheter. No enhancement of the surface is the peritoneum and. Vascular/lymphatic congestion within small bowel mesenteric, similar to prior. Small lymph nodes, none of which are enlarged. Mesenteric: Fluid throughout the peritoneal of small volume. Small amount of anti dependent gas, similar to  prior. Peritoneal dialysis catheter terminates within the low pelvis, adjacent to the rectum. Reproductive: Unremarkable appearance of the pelvic organs. Other: Small fat containing umbilical hernia. Musculoskeletal: Sclerotic appearance of the skeletal elements. No acute bony abnormality. IMPRESSION: Vascular: No acute vascular abnormality. Similar to yesterday's study there is diffuse atherosclerotic changes of the abdominal aorta, with involvement of the mesenteric vasculature. Again, there is likely greater than 50% stenosis at the celiac artery and IMA artery, however, CTA resolution difficult to make precise assessment. The degree of stenosis at the SMA origin is likely high-grade given the post stenotic dilation and degree of disease. Distal branches of the mesenteric  vessels remain patent. Bilateral renal artery stenosis. Portal venous system unremarkable. Systemic venous system unremarkable. Nonvascular: Morphology of the liver compatible with cirrhosis. Associated borderline splenomegaly, indicating developing portal hypertension. No evidence of bowel ischemia, with uniform enhancement of stomach and small bowel. Although the colon wall cannot be assessed with the presence of positive enteric contrast, there are no focal findings to suggest colonic ischemia. Temporal resolution of mild small bowel thickening is improved with 24 hour follow-up CT, with mild thickening of the small bowel in the mid abdomen. There is associated lymphatic congestion of the small bowel mesenteric with associated small lymph nodes. Finding is nonspecific. There is no evidence of venous obstruction per se, although these findings can be seen with portal colopathy/enteropathy in the setting of cirrhosis and developing portal hypertension. Alternative consideration would be an nonspecific inflammatory or infectious enteritis. These results were called by telephone at the time of interpretation on 04/03/2016 at 8:56 am to Dr. Carles Collet, who verbally acknowledged these results. Small volume of low-density ascites, likely related to PD catheter. PD catheter within the dependent pelvis adjacent to rectum. No evidence of peritonitis on the contrast-enhanced exam. Persistent thickening of the left posterior lower pleura, unchanged from the CT 11/26/2014. This may reflect rounded atelectasis/scarring. Further evaluation with chest CT may be considered if the patient has a history of carcinoma. Coronary artery disease.  Aortic valve calcifications. Signed, Dulcy Fanny. Earleen Newport, DO [facility] Radiology Electronically Signed   By: Corrie Mckusick D.O.   On: 04/03/2016 08:59   Medications:   . calcium acetate  1,334 mg Oral BID WC  . calcium acetate  667 mg Oral Q supper   . cefTAZidime (FORTAZ)  IV  1 g Intravenous Q24H  . chlorhexidine  15 mL Mouth/Throat QHS  . dialysis solution 1.5% low-MG/low-CA   Intraperitoneal Q24H  . dialysis solution 1.5% low-MG/low-CA   Intraperitoneal Q24H  . gabapentin  100 mg Oral BID  . gentamicin cream  1 application Topical Daily  . insulin aspart  0-9 Units Subcutaneous TID WC  . ipratropium  2 spray Each Nare BID  . lisinopril  20 mg Oral QHS  . metoprolol succinate  50 mg Oral BID  . multivitamin  1 tablet Oral Daily  . pantoprazole  40 mg Oral Daily  . polyethylene glycol  17 g Oral Daily  . rosuvastatin  5 mg Oral Once per day on Mon Wed Fri  . sodium chloride flush  3 mL Intravenous Q12H   CCPD. [name] is his primary nephrologist. 6 exchanges overnight, 2553mL volume, last fill 1060mL. Dwell time 1:30, drain time 25min. Has used heparin x 3 nights for fibrin clot on 10/7. - Epogen   Assessment/Recommendations  1. Abdominal pain: multifactorial 1. Unclear etiology. Current picture started about 2 weeks ago with severe N/V/abd pain that  they call a "GI bug".  2. Subsequently he developed culture neg peritonitis with cell count around 1100 that has been serially improving with IP vanco and fortaz, but then developed very severe abdominal pain - PD fluid is clear, cell count is down to 129, no abscess or fluid collection on 2 CT's (10/7, 10/11)  so cannot attribute his current pain, especially with the severity, to his resolving culture negative peritonitis.   3. Amylase and lipase repeated (lipase up 10/7) and both are normal.   4. CT angiogram with >50% stenosis of celiac, SMA and IMA by radiologist read raised question of mesenteric insufficiency. Study repeated yesterday no differences - was again pointed out that SMA >50% with post stenotic dilatation so maybe more sig?  5. Await Dr.Brabham's f/utoday. 6. Enema for obstipation  2. Peritonitis:  1. Culture negative.  2. Clinically mproving on vanco and  fortaz.  3. PD fluid cell count falling.  4. Have changed ATB's to IV. Initial Vanco level random 31 so monitoring levels - pharmacy managing. Tressie Ellis continued at 1 gm IV Q24 hours.   3.  ESRD on PD:   1. Resumed PD last night - no technical issues 2. Using all 1.5% solutions for now. 3. Add heparin for fibrin.   4. Hypokalemia  1. Rx'd with 40 mEq BID x 2 doses yesterday  2. No labs done today  5.  Hypertension - BP's soft and Dr. Carles Collet has reduced meds some.  6.  Anemia: Hgb 11.4->10.7. If drops again will add some Elma aranesp.  7.  Metabolic bone disease: Ca 9.3. Continue binders once eating better. Not on VDRA; last PTH 139 on 10/7.  8.  Nutrition: Alb 2.7. Start supplements once eating.  9. 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 No issues right now.   Jamal Maes, MD [facility] [phone] pager 04/03/2016, 12:05 PM

## 2016-04-03 NOTE — Consult Note (Signed)
[facility] Surgery Consult Note  Victor Lane [name] March 22, 1946  [id number].    Requesting MD: Dr. Carles Collet Chief Complaint/Reason for Consult: abdominal pain  HPI:  70 y.o. male with multiple medical problems, on daily peritoneal dialysis, who general surgery was asked to see regarding abdominal pain. Today he is somnolent secondary to pain medication and his wife reports majority of history. He presented to the hospital one week ago for abdominal pain associated with nausea/vomiting/diarrhea and was discharged with a diagnosis of peritonitis and peritoneal antibiotics. 4-5 days later the patients pain was not improving, it was worsening so he came to the ED. Pain is sharp, constant, and worse over lower abdomen. Patients and his wife state that the pain has improved over the past few days but is still severe. Pain is worse during dialysis. Diarrhea has resolved and now patient is constipated. Associated symptoms include nausea, decreased appetite, fatigue, and constipation. Last BM was one week ago. Denies fever, chills, SOB, CP, palpitations, melena, and hematochezia. Has been on PD since 2014.   Thus far cultures of peritoneal fluid have been negative and the patients pain has not improved on broad spectrum antibiotics.   Patient has received CT scans on 10/7, 10/11, and 10/12 - all of which are significant for small amount of ascites, mild small bowel wall thickening (improving), and stenosis of celiac, SMA, and IMA vessels with post-stenotic dilatation of SMA. No free air or pneumatosis. No sign of intraabdominal abscess. No signs SBO. Normal appendix. Small stool burden.    Vascular was consulted regarding vessel stenosis - initially treated patient for gastroenteritis and when this failed got a CT angiogram revealing arterial flow to bowels with patency of all 3 mesenteric vessels. Low suspicion that mesenteric ischemia is contributing to pain.   Past abdominal surgeries: CAPD insertion,  cholecystectomy, diagnostic laparoscopy  ROS: All systems reviewed and otherwise negative except for as above  Family History  Problem Relation Age of Onset  . Adopted: Yes  . Family history unknown: Yes    Past Medical History:  Diagnosis Date  . Aortic regurgitation   . Aortic valve sclerosis   . Arthritis    "just age related" (08/03/2014)  . Carotid artery occlusion    s/p left CEA with 40-59% restenosis and >80% stenosis of the Right ICA - followed by Dr. Kellie Simmering  . Cataracts, bilateral   . Chronic combined systolic and diastolic CHF (congestive heart failure) ([place])   . Complication of anesthesia    hard to wake up and then panics waking up  . Coronary artery disease    severe CAD with mid RCA occlusion, distal LAD stenosis, moderate disease of the left circ. Repeat cath 07/2014 for worsening LVF showed severe disease of the mid LAD, mild to mod LCX disease, occluded PL of the RCA with left to right collaterals to distal RCA territory s/p PCI of the LAD with DES.  It was noted that if patient develops CP resistant to medical therapy could consider PCI of RCA  . Diastolic dysfunction   . Dilated aortic root ([place])   . ESRD on peritoneal dialysis ([place]) 09/2013   "followed by [name]; [facility]" (08/03/2014)  . Fibromyalgia    takes tramadol daily as needed  . GERD (gastroesophageal reflux disease)   . H/O vitamin D deficiency   . Hearing loss   . History of colon polyps   . Hypercholesteremia   . Hypertension   . Myalgia and myositis   . Neuropathy (  [facility])   . Peripheral neuropathy ([place])   . S/P radiation therapy 01/24/2013-03/14/2013   Larynx/glottis / 63 Gy in 28 fractions  . Sleep apnea    to request from Dr.Fried was done about 44yr ago but no cpap d/t weight loss  . Type II diabetes mellitus ([place]    "diet controlled now" (08/03/2014)  . 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 AV FISTULA PLACEMENT Left 02/23/2013   Procedure: ARTERIOVENOUS (AV) FISTULA CREATION;  Surgeon: BConrad Allenwood MD;  [place]  Service: Vascular;  Laterality: Left;  . CAPD INSERTION N/A 08/01/2013   Procedure: LAPAROSCOPIC INSERTION CONTINUOUS AMBULATORY PERITONEAL DIALYSIS CATHETER;  Surgeon: SAdin Hector MD;  [place]  Service: General;  Laterality: N/A;  . CARDIAC CATHETERIZATION  07/2013  . CAROTID ENDARTERECTOMY Left   . CAROTID STENT INSERTION N/A 08/21/2014   Procedure: CAROTID STENT INSERTION;  Surgeon: CElam Dutch MD;  [facility];  Service: Cardiovascular;  Laterality: N/A;  . CHOLECYSTECTOMY  1993  . COLONOSCOPY W/ BIOPSIES AND POLYPECTOMY     Hx: of  . CORONARY ANGIOPLASTY  08/03/2014  . LAPAROSCOPIC ABDOMINAL EXPLORATION N/A 08/01/2013   Procedure: LAPAROSCOPIC ABDOMINAL EXPLORATION;  Surgeon: SAdin Hector MD;  [place]  Service: General;  Laterality: N/A;  . LEFT AND RIGHT HEART CATHETERIZATION WITH CORONARY ANGIOGRAM N/A 08/03/2014   Procedure: LEFT AND RIGHT HEART CATHETERIZATION WITH CORONARY ANGIOGRAM;  Surgeon: JJettie Booze MD;  [facility];  Service: Cardiovascular;  Laterality: N/A;  . LEFT HEART CATHETERIZATION WITH CORONARY ANGIOGRAM N/A 08/14/2013   Procedure: LEFT HEART CATHETERIZATION WITH CORONARY ANGIOGRAM;  Surgeon: JJettie Booze MD;  [facility];  Service: Cardiovascular;  Laterality: N/A;  . MICROLARYNGOSCOPY WITH LASER N/A 01/04/2013   Procedure: MICROLARYNGOSCOPY WITH BIOPSY/LASER;  Surgeon: JMelissa Montane MD;  [place]  Service: ENT;  Laterality: N/A;  . SHOULDER OPEN ROTATOR CUFF REPAIR Right 07/06/2011  . TRACHEOSTOMY  01/2013  . TRACHEOSTOMY CLOSURE  02/2013  . ULNAR NERVE TRANSPOSITION Left 02/22/2014   Procedure: LEFT ULNAR NERVE RELEASE AND OR TRANSPOSITION;  Surgeon: FLinna Hoff MD;  [place]  Service: Orthopedics;  Laterality:  Left;    Social History:  reports that he quit smoking about 20 years ago. His smoking use included Cigarettes. He has a 62.00 pack-year smoking history. He has never used smokeless tobacco. He reports that he does not drink alcohol or use drugs.  Allergies:  Allergies  Allergen Reactions  . Fentanyl Other (See Comments)    Confusion when patch applied  . Nsaids Other (See Comments)    Severe kidney disease/ESRD  . Amoxicillin Diarrhea and Nausea And Vomiting  . Moxifloxacin Diarrhea and Nausea And Vomiting    Medications Prior to Admission  Medication Sig Dispense Refill  . acetaminophen (TYLENOL) 500 MG tablet Take 1,000 mg by mouth every 6 (six) hours as needed for mild pain or headache.     . ALPRAZolam (XANAX) 0.5 MG tablet Take 0.5 mg by mouth at bedtime.     .Marland KitchenamLODipine (NORVASC) 5 MG tablet Take 5 mg by mouth every morning.    .Marland Kitchenaspirin 325 MG EC tablet Take 325 mg by mouth daily.    . calcium acetate (PHOSLO) 667 MG capsule Take 667-1,334 mg by mouth See admin instructions. Take  2 capsules (1334 mg) every morning and at lunch. Depending on the evening meal, takes 667 mg (1 tablet) or 1334 mg (2 tablets)    . chlorhexidine (PERIDEX) 0.12 % solution Use as directed 15 mLs in the mouth or throat at bedtime.     . DOCOSAHEXAENOIC ACID PO Take 2 g by mouth every morning.    . gabapentin (NEURONTIN) 100 MG capsule Take 100 mg by mouth 2 (two) times daily.    Marland Kitchen gentamicin cream (GARAMYCIN) 0.1 % Apply 1 application topically daily.    Marland Kitchen glipiZIDE (GLUCOTROL XL) 2.5 MG 24 hr tablet Take 2.5 mg by mouth daily as needed (for high blood sugar).   0  . ipratropium (ATROVENT) 0.06 % nasal spray Place 2 sprays into both nostrils 2 (two) times daily.     Marland Kitchen lisinopril (PRINIVIL,ZESTRIL) 20 MG tablet Take 20 mg by mouth at bedtime.    Marland Kitchen lisinopril (PRINIVIL,ZESTRIL) 40 MG tablet Take 40 mg by mouth every morning.    . metoprolol succinate (TOPROL-XL) 50 MG 24 hr tablet Take 50 mg by mouth 2  (two) times daily. Take with or immediately following a meal.    . multivitamin (RENA-VIT) TABS tablet Take 1 tablet by mouth daily.    . mupirocin ointment (BACTROBAN) 2 % Apply 1 application topically 2 (two) times daily as needed (rash).     . nitroGLYCERIN (NITROSTAT) 0.4 MG SL tablet Place 1 tablet (0.4 mg total) under the tongue every 5 (five) minutes as needed for chest pain. 25 tablet 2  . omeprazole (PRILOSEC) 20 MG capsule Take 20 mg by mouth every morning.    Marland Kitchen oxyCODONE-acetaminophen (PERCOCET/ROXICET) 5-325 MG tablet Take 1 tablet by mouth every 6 (six) hours as needed for severe pain. 10 tablet 0  . rosuvastatin (CRESTOR) 5 MG tablet Take 1 tablet by mouth as directed up to once daily (Patient taking differently: Take 5 mg by mouth 3 (three) times a week. Monday, Wednesday, Friday at noon) 90 tablet 3  . traMADol (ULTRAM) 50 MG tablet Take 100 mg by mouth every 4 (four) hours as needed (pain).    . triamcinolone cream (KENALOG) 0.1 % Apply 1 application topically 2 (two) times daily as needed (rash).     Marland Kitchen lisinopril (PRINIVIL,ZESTRIL) 5 MG tablet TAKE 1 TABLET (5 MG TOTAL) BY MOUTH DAILY. (Patient not taking: Reported on 04/01/2016) 30 tablet 6    Blood pressure 122/63, pulse 92, temperature 98.3 F (36.8 C), temperature source Oral, resp. rate (!) 21, height '5\' 10"'$  (1.778 m), weight 178 lb 2.1 oz (80.8 kg), SpO2 94 %. Physical Exam: General: pleasant, somnolent, ill-appearing white male who is laying in bed in NAD HEENT: head is normocephalic, atraumatic. Heart: regular, rate, and rhythm.  No obvious murmurs, gallops, or rubs noted.  Palpable pedal pulses bilaterally Lungs: CTAB, no wheezes, rhonchi, or rales noted.  Respiratory effort nonlabored Abd: soft, tender to light palpation of lower abdomen worse over RLQ, +BS; no guarding or rebound tenderness. MS: all 4 extremities are symmetrical with no cyanosis, clubbing, or edema. Skin: warm and dry with no masses, lesions, or  rashes Psych: appropriate affect. Neuro: A&Ox3, moves all extremities spontaneously, normal speech  Results for orders placed or performed during the hospital encounter of 04/01/16 (from the past 48 hour(s))  CBG monitoring, ED     Status: Abnormal   Collection Time: 04/01/16  3:16 PM  Result Value Ref Range   Glucose-Capillary 128 (H) 65 - 99 mg/dL  Lactic  acid, plasma     Status: Abnormal   Collection Time: 04/01/16  3:32 PM  Result Value Ref Range   Lactic Acid, Venous 2.9 (HH) 0.5 - 1.9 mmol/L    Comment: CRITICAL RESULT CALLED TO, READ BACK BY AND VERIFIED WITH: N STEPHENSON,RN 04/01/2016 1700 WBOND   Vancomycin, random     Status: None   Collection Time: 04/01/16  4:09 PM  Result Value Ref Range   Vancomycin Rm 31     Comment:        Random Vancomycin therapeutic range is dependent on dosage and time of specimen collection. A peak range is 20.0-40.0 ug/mL A trough range is 5.0-15.0 ug/mL          MRSA PCR Screening     Status: None   Collection Time: 04/01/16  5:49 PM  Result Value Ref Range   MRSA by PCR NEGATIVE NEGATIVE    Comment:        The GeneXpert MRSA Assay (FDA approved for NASAL specimens only), is one component of a comprehensive MRSA colonization surveillance program. It is not intended to diagnose MRSA infection nor to guide or monitor treatment for MRSA infections.   Glucose, capillary     Status: None   Collection Time: 04/01/16  9:12 PM  Result Value Ref Range   Glucose-Capillary 93 65 - 99 mg/dL   Comment 1 Notify RN   Comprehensive metabolic panel     Status: Abnormal   Collection Time: 04/02/16  2:17 AM  Result Value Ref Range   Sodium 132 (L) 135 - 145 mmol/L   Potassium 3.2 (L) 3.5 - 5.1 mmol/L    Comment: DELTA CHECK NOTED   Chloride 93 (L) 101 - 111 mmol/L   CO2 24 22 - 32 mmol/L   Glucose, Bld 111 (H) 65 - 99 mg/dL   BUN 33 (H) 6 - 20 mg/dL   Creatinine, Ser 9.96 (H) 0.61 - 1.24 mg/dL   Calcium 8.6 (L) 8.9 - 10.3 mg/dL   Total  Protein 5.6 (L) 6.5 - 8.1 g/dL   Albumin 2.1 (L) 3.5 - 5.0 g/dL   AST 23 15 - 41 U/L   ALT 22 17 - 63 U/L   Alkaline Phosphatase 96 38 - 126 U/L   Total Bilirubin 0.4 0.3 - 1.2 mg/dL   GFR calc non Af Amer 5 (L) >60 mL/min   GFR calc Af Amer 5 (L) >60 mL/min    Comment: (NOTE) The eGFR has been calculated using the CKD EPI equation. This calculation has not been validated in all clinical situations. eGFR's persistently <60 mL/min signify possible Chronic Kidney Disease.    Anion gap 15 5 - 15  CBC     Status: Abnormal   Collection Time: 04/02/16  2:17 AM  Result Value Ref Range   WBC 14.5 (H) 4.0 - 10.5 K/uL   RBC 3.23 (L) 4.22 - 5.81 MIL/uL   Hemoglobin 10.7 (L) 13.0 - 17.0 g/dL   HCT 34.2 (L) 39.0 - 52.0 %   MCV 105.9 (H) 78.0 - 100.0 fL   MCH 33.1 26.0 - 34.0 pg   MCHC 31.3 30.0 - 36.0 g/dL   RDW 17.2 (H) 11.5 - 15.5 %   Platelets 302 150 - 400 K/uL  Protime-INR     Status: Abnormal   Collection Time: 04/02/16  2:17 AM  Result Value Ref Range   Prothrombin Time 15.6 (H) 11.4 - 15.2 seconds   INR 1.23   Lactic acid, plasma  Status: None   Collection Time: 04/02/16  2:17 AM  Result Value Ref Range   Lactic Acid, Venous 1.2 0.5 - 1.9 mmol/L  Glucose, capillary     Status: Abnormal   Collection Time: 04/02/16  7:43 AM  Result Value Ref Range   Glucose-Capillary 126 (H) 65 - 99 mg/dL  Amylase     Status: None   Collection Time: 04/02/16  8:14 AM  Result Value Ref Range   Amylase 82 28 - 100 U/L  Lipase, blood     Status: None   Collection Time: 04/02/16  8:14 AM  Result Value Ref Range   Lipase 21 11 - 51 U/L  Glucose, capillary     Status: None   Collection Time: 04/02/16 11:44 AM  Result Value Ref Range   Glucose-Capillary 99 65 - 99 mg/dL  Glucose, capillary     Status: Abnormal   Collection Time: 04/02/16  6:17 PM  Result Value Ref Range   Glucose-Capillary 149 (H) 65 - 99 mg/dL  Glucose, capillary     Status: Abnormal   Collection Time: 04/02/16  9:04 PM   Result Value Ref Range   Glucose-Capillary 125 (H) 65 - 99 mg/dL   Comment 1 Notify RN   Glucose, capillary     Status: Abnormal   Collection Time: 04/03/16  8:34 AM  Result Value Ref Range   Glucose-Capillary 153 (H) 65 - 99 mg/dL  Glucose, capillary     Status: None   Collection Time: 04/03/16 11:47 AM  Result Value Ref Range   Glucose-Capillary 88 65 - 99 mg/dL   Ct Angio Abdomen Pelvis  W &/or Wo Contrast  Result Date: 04/03/2016 CLINICAL DATA:  70 year old male with abdominal pain. Negative lactic acidosis. Renal failure with peritoneal dialysis catheter. EXAM: CT ANGIOGRAPHY  ABDOMEN AND PELVIS TECHNIQUE: Multidetector CT imaging through the abdomen and pelvis was performed using the standard protocol during bolus administration of intravenous contrast. Delayed images included. Multiplanar reconstructed images including MIPs were obtained and reviewed to evaluate the vascular anatomy. CONTRAST:  100 cc Isovue 370 COMPARISON:  CT angiogram done the previous day, CT 03/28/2016, CT 11/26/2014 FINDINGS: VASCULAR Aorta: Extensive mixed calcified and soft plaque of the lower thoracic and abdominal aorta. No aneurysm. No dissection. No periaortic fluid. Celiac: Similar appearance of atherosclerotic changes at the origin of the celiac artery, which is patent though perhaps with 50% or greater stenosis. Post stenotic dilation. Typical branch pattern again noted, discussed on the prior CT. SMA: Similar stenotic appearance of the superior mesenteric artery with mixed calcified and soft plaque. Greater than 50% stenosis. Distal branches appear patent. Renals: Calcified and soft plaque at the origin the bilateral renal arteries. Single bilateral renal arteries. IMA: Patent IMA. Likely stenosis at the origin. Difficult to quantify degree of stenosis. Right lower extremity: Mixed calcified and soft plaque of the right iliac system. Again, likely greater than 50% stenosis in the proximal right common iliac  artery. Hypogastric artery and external iliac artery patent. Atherosclerotic disease throughout the external iliac artery. Common femoral artery in the proximal full more all vessels patent. Left lower extremity: Mixed calcified and soft plaque of the left iliac system. Likely stenosis at the distal common iliac artery. Hypogastric artery and external iliac artery patent. Disease through the length of the external iliac artery. Common femoral artery in the proximal fill more 0 vasculature patent on today's exam. Veins: Portal venous system remains patent. Unremarkable appearance of the systemic venous vasculature. Review of the  MIP images confirms the above findings. NON-VASCULAR Lower chest: Respiratory motion somewhat limits evaluation of the lung bases. Coverage of the lower chest/ field-of-view larger on the current study. Plaque like thickening of the left sided pleura at the lung base. This has been present since the CT 11/26/2014 Coronary calcifications.  Aortic valve calcifications. Hepatobiliary: Morphology of the liver compatible with cirrhosis with enlarged left lobe, caudate lobe, and nodular configuration. No focal lesion identified. Pancreas: Unremarkable appearance of pancreas Spleen: Borderline enlarged spleen Adrenals/Urinary Tract: Unremarkable appearance of adrenal glands. Right: Severe renal cortical thinning. No hydronephrosis or nephrolithiasis. Hypodense lesion on the lateral cortex and at the superior cortex too small to characterize. Left: Severe renal cortical thinning. No hydronephrosis. Similar appearance of low-density cystic structures, the larger of which are compatible with simple cysts. Smaller are too small to characterize. Excreted contrast within the urinary bladder. Stomach/Bowel: Unremarkable stomach. Small hiatal hernia. No evidence of small bowel obstruction or transition point. Minimal thickening of small bowel wall, persists after the prior CT. Small bowel wall enhances on  the arterial and venous phase, with no CT evidence of ischemia. Retained oral contrast within colon, limiting evaluation of the mucosal surface. No evidence of obstruction. Small stool burden. Diverticular disease. Normal appendix. Lymphatic: Low-density ascites within the pericolic gutter, right upper quadrant, and within the dependent pelvis associated with the peritoneal dialysis catheter. No enhancement of the surface is the peritoneum and. Vascular/lymphatic congestion within small bowel mesenteric, similar to prior. Small lymph nodes, none of which are enlarged. Mesenteric: Fluid throughout the peritoneal of small volume. Small amount of anti dependent gas, similar to prior. Peritoneal dialysis catheter terminates within the low pelvis, adjacent to the rectum. Reproductive: Unremarkable appearance of the pelvic organs. Other: Small fat containing umbilical hernia. Musculoskeletal: Sclerotic appearance of the skeletal elements. No acute bony abnormality. IMPRESSION: Vascular: No acute vascular abnormality. Similar to yesterday's study there is diffuse atherosclerotic changes of the abdominal aorta, with involvement of the mesenteric vasculature. Again, there is likely greater than 50% stenosis at the celiac artery and IMA artery, however, CTA resolution difficult to make precise assessment. The degree of stenosis at the SMA origin is likely high-grade given the post stenotic dilation and degree of disease. Distal branches of the mesenteric vessels remain patent. Bilateral renal artery stenosis. Portal venous system unremarkable. Systemic venous system unremarkable. Nonvascular: Morphology of the liver compatible with cirrhosis. Associated borderline splenomegaly, indicating developing portal hypertension. No evidence of bowel ischemia, with uniform enhancement of stomach and small bowel. Although the colon wall cannot be assessed with the presence of positive enteric contrast, there are no focal findings to  suggest colonic ischemia. Temporal resolution of mild small bowel thickening is improved with 24 hour follow-up CT, with mild thickening of the small bowel in the mid abdomen. There is associated lymphatic congestion of the small bowel mesenteric with associated small lymph nodes. Finding is nonspecific. There is no evidence of venous obstruction per se, although these findings can be seen with portal colopathy/enteropathy in the setting of cirrhosis and developing portal hypertension. Alternative consideration would be an nonspecific inflammatory or infectious enteritis. These results were called by telephone at the time of interpretation on 04/03/2016 at 8:56 am to Dr. Carles Collet, who verbally acknowledged these results. Small volume of low-density ascites, likely related to PD catheter. PD catheter within the dependent pelvis adjacent to rectum. No evidence of peritonitis on the contrast-enhanced exam. Persistent thickening of the left posterior lower pleura, unchanged from the CT 11/26/2014. This  may reflect rounded atelectasis/scarring. Further evaluation with chest CT may be considered if the patient has a history of carcinoma. Coronary artery disease.  Aortic valve calcifications. Signed, Dulcy Fanny. Earleen Newport, DO [facility] Radiology Electronically Signed   By: Corrie Mckusick D.O.   On: 04/03/2016 08:59   Assessment/Plan Abdominal pain, unknown etiology - recent infectious gastroenteritis  - history of constipation - takes Miralax and dulcolax at home; tried miralax/bisacodyl/ soap suds enema with no relief - stenosis of celiac, SMA, and IMA vessels - pain not associated with meals - saw Dr. Paulita Fujita in the past for pain after initiation of PD and issues with constipation (2016), no future complains/visits. - currently receiving antibiotics, pain control, and advancing diet gradually  Peritonitis - PD fluid cultures negative, WBC trending down on PD fluid  analysis  ID: Vanc, [id number]    Recommendations - From a general surgery standpoint CT scan is negative for signs of intestinal obstruction or appendicitis. Patients abdominal pain is not worse with after meals and there are no overt signs of intestinal ischemia on CT scan. I currently recommend a smog enema to relieve small stool burden. I have a very low suspicion that stool burden is contributing to his pain but it may provide some relief. Continue current care with IV antibiotics, pain control, and judicious advancement of diet. A diagnostic laparoscopy is not indicated at this time, will confirm with MD.   Jill Alexanders, [facility] Surgery 04/03/2016, 3:07 PM Pager: [phone] Consults: [phone] Mon-Fri 7:00 am-4:30 pm Sat-Sun 7:00 am-11:30 am

## 2016-04-04 LAB — RENAL FUNCTION PANEL
ALBUMIN: 2 g/dL — AB (ref 3.5–5.0)
ANION GAP: 15 (ref 5–15)
BUN: 32 mg/dL — AB (ref 6–20)
CALCIUM: 9 mg/dL (ref 8.9–10.3)
CO2: 25 mmol/L (ref 22–32)
Chloride: 92 mmol/L — ABNORMAL LOW (ref 101–111)
Creatinine, Ser: 10.52 mg/dL — ABNORMAL HIGH (ref 0.61–1.24)
GFR calc Af Amer: 5 mL/min — ABNORMAL LOW (ref >60)
GFR, EST NON AFRICAN AMERICAN: 4 mL/min — AB (ref >60)
GLUCOSE: 143 mg/dL — AB (ref 65–99)
PHOSPHORUS: 6.5 mg/dL — AB (ref 2.5–4.6)
POTASSIUM: 2.7 mmol/L — AB (ref 3.5–5.1)
SODIUM: 132 mmol/L — AB (ref 135–145)

## 2016-04-04 LAB — CBC
HEMATOCRIT: 33.1 % — AB (ref 39.0–52.0)
HEMOGLOBIN: 10.2 g/dL — AB (ref 13.0–17.0)
MCH: 32.9 pg (ref 26.0–34.0)
MCHC: 30.8 g/dL (ref 30.0–36.0)
MCV: 106.8 fL — ABNORMAL HIGH (ref 78.0–100.0)
Platelets: 304 10*3/uL (ref 150–400)
RBC: 3.1 MIL/uL — ABNORMAL LOW (ref 4.22–5.81)
RDW: 17.6 % — ABNORMAL HIGH (ref 11.5–15.5)
WBC: 13.9 10*3/uL — AB (ref 4.0–10.5)

## 2016-04-04 LAB — GLUCOSE, CAPILLARY
GLUCOSE-CAPILLARY: 161 mg/dL — AB (ref 65–99)
Glucose-Capillary: 116 mg/dL — ABNORMAL HIGH (ref 65–99)

## 2016-04-04 LAB — VANCOMYCIN, RANDOM: VANCOMYCIN RM: 23

## 2016-04-04 MED ORDER — OXYCODONE-ACETAMINOPHEN 5-325 MG PO TABS: 1.0000 | ORAL_TABLET | Freq: Four times a day (QID) | ORAL | 0 refills | Status: AC | PRN

## 2016-04-04 MED ORDER — POTASSIUM CHLORIDE CRYS ER 10 MEQ PO TBCR
30.0000 meq | EXTENDED_RELEASE_TABLET | Freq: Once | ORAL | Status: AC
  Administered 2016-04-04: 30 meq via ORAL
  Filled 2016-04-04: qty 1

## 2016-04-04 MED ORDER — DEXTROSE 5 % IV SOLN
2.0000 g | Freq: Once | INTRAVENOUS | Status: AC
  Administered 2016-04-04: 2 g via INTRAVENOUS
  Filled 2016-04-04: qty 2

## 2016-04-04 MED ORDER — VANCOMYCIN HCL IN DEXTROSE 750-5 MG/150ML-% IV SOLN: 750.0000 mg | Freq: Once | INTRAVENOUS | Status: DC

## 2016-04-04 NOTE — Progress Notes (Signed)
CRITICAL VALUE ALERT  Critical value received:  Potassium 2.7  Date of notification: 04/04/2016  Time of notification: [id number]  Critical value read back: Yes  Nurse who received alert: L. Chriss Czar, RN  MD notified (1st page):  Dr. Maudie Mercury  Time of first page:  [phone]  Responding MD:  Dr. Maudie Mercury  Time MD responded:  [phone]

## 2016-04-04 NOTE — Progress Notes (Signed)
[facility] Brief Note  Called by Dr. Carles Collet Patient is signing out of the hospital AMA and apparently wife in support of this Will need continuation of ATB's for peritonitis  Potassium today is 2.7 and he needs to be replaced before he goes home. I see that 30 mEq ordered for him.  Vancomycin level is still therapeutic today at 23 (last dosed at home training on Monday) He should get a 2 gm dose of Tressie Ellis today before leaving the hospital if possible He can return to the Home Training Unit on Monday for cell count, vancomycin level, redosing of Fortaz, and renal panel.  Jamal Maes, MD [place] Pager 04/04/2016, 11:59 AM

## 2016-04-04 NOTE — Progress Notes (Signed)
I had a chance to see and examine the patient just before he left AMA, and to discuss his case with his wife at the bedside.   From the pain perspective, he is stable or perhaps slightly improved.   On exam, there is no overt peritonitis, and there are some bowel sounds in the upper abdomen. He is ambulatory, but seems somewhat uncomfortable. He is certainly not in acute distress.  His white count is slightly improved today, and there is no fever.  From what the patient's wife says, he is emotionally frustrated with being in the hospital and simply needs to get out. They understand that we do not have a specific diagnosis as to what is going on.  The patient's wife indicated that she would be willing to call our office if the pain does not resolve, to arrange follow-up with Dr. Paulita Fujita. I set realistic expectations concerning such a request, specifically, that our office is not really set up to provide "urgent" care in the office setting, and that what we are able to do in the office for acute problems is rather limited, without the benefit of imaging studies, immediate lab work, etc. She seems to understand this very well.  Hopefully, the patient will feel better in the home setting and whatever this process is will resolve.  Cleotis Nipper, M.D. Pager [phone] If no answer or after 5 PM call [phone]

## 2016-04-04 NOTE — Progress Notes (Signed)
Pharmacy Antibiotic Note  Victor Lane is a 70 y.o. male admitted on 04/01/2016 with culture-negative peritonitis. He was transitioned from intraperitoneal vancomycin and ceftazidime to IV antibiotics on admission. His vancomycin level is down to 23 mcg/ml and I anticipate it will be </= 20 on 10/15, which will warrant a Vancomycin dose.  Plan: Give Vancomycin 750mg  IV x 1 on 10/15 Vancomycin random level on 10/18-10/19 to assess Continue Ceftazidime 1g IV q24h Follow dialysis plans Follow culture data Follow for anticipated length of therapy   Height: 5\' 10"  (177.8 cm) Weight: 178 lb 2.1 oz (80.8 kg) IBW/kg (Calculated) : 73  Temp (24hrs), Avg:97.7 F (36.5 C), Min:97.4 F (36.3 C), Max:98 F (36.7 C)   Recent Labs Lab 03/28/16 1942 04/01/16 0507 04/01/16 0515 04/01/16 0844 04/01/16 1306 04/01/16 1532 04/01/16 1609 04/02/16 0217 04/04/16 0425 04/04/16 0426  WBC 12.5* 10.4  --   --   --   --   --  14.5* 13.9*  --   CREATININE 9.59* 8.45*  --   --   --   --   --  9.96*  --  10.52*  LATICACIDVEN  --   --  3.22* 1.64 1.6 2.9*  --  1.2  --   --   VANCORANDOM  --   --   --   --   --   --  31  --  23  --     Estimated Creatinine Clearance: 6.7 mL/min (by C-G formula based on SCr of 10.52 mg/dL (H)).    Allergies  Allergen Reactions  . Fentanyl Other (See Comments)    Confusion when patch applied  . Nsaids Other (See Comments)    Severe kidney disease/ESRD  . Amoxicillin Diarrhea and Nausea And Vomiting  . Moxifloxacin Diarrhea and Nausea And Vomiting    Antimicrobials: Zosyn 10/11>> 10/11 Vanc 2g IV x 1 dose on 10/8 in the ED IP Vancomycin 10/9 >> 10/11 Vancomycin 10/11 >> Ceftazidime 10/11 >>  Dose adjustments this admission:  10/8 Vanc 2g IV x 1 given in ED, then sent out with IP Vanc 10/11 VR 31 10/14 VR 23 10/15 Vanc 750mg  IV x 1 ordered  Microbiology results:  10/11 peritoneal: ngtd 10/11 blood x 2: ngtd  Thank you for allowing pharmacy to be a  part of this patient's care.  Legrand Como, Pharm.D., BCPS, AAHIVP Clinical Pharmacist Phone: [phone] or [phone] 04/04/2016, 11:17 AM

## 2016-04-04 NOTE — Discharge Summary (Signed)
Physician Discharge Summary  Victor Lane [id number] DOB: 1946-03-06 DOA: 04/01/2016  PCP: [name], MD  Admit date: 04/01/2016 Discharge date: 04/04/2016  Admitted From: Home Disposition:  AGAINST MEDICAL ADVICE  PATIENT IS LEAVING AGAINST MEDICAL ADVICE   Discharge Condition:PATIENT IS LEAVING AGAINST MEDICAL ADVICE CODE STATUS: FULL Diet recommendation: RENAL   Brief/Interim Summary: 70 year old male with a history of ESRD on peritoneal dialysis, CAD, diabetes mellitus type 2, carotid stenosis, aortic stenosis, GERD, laryngeal cancer presented with abdominal pain that began on 03/27/2016. The patient had associated nausea, vomiting, and diarrhea for nearly one week prior to development of his worsening abdominal pain. After this, he had an episode with PD outflow blockage due to fibrin clot which his PD center was able to sort out on 10/6 and get him drained (he reports 10L drained). The next evening, he developed severe lower abdominal pain. He came to the ED on 10/7 for evaluation. Abdominal CT was neg for acute findings. PD Cx, gram stain, cell count collected. He was found to have increased cell count in PD fluid (1196 WBC), concerning for peritonitis. He was started on Vanc/Fortaz and discharged. On 10/9, he went to his PD unit as directed. Repeat cell count, Cx, gram stain collected and he was re-dosed with Vanc and [place]. Cx pending, but cell count down to 292. overnight 10/10, while on his cycler, he developed recurrent severe lower abdominal pain and presented back to the ED for evaluation. Pain is constant, located in mid-lower abdomen. No N/V/diarrhea at this time. On 04/04/16, patient wanted to leave AMA despite still having significant abdominal pain.  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 Valene Bors not feel there was any intraabd castastrophe--continue medical therapy -Appreciate general surgery consult--no indication for exploratory laparoscopy at this time -enema was given, but still no BM -04/04/16--pt and wife wanted to leave AMA--discussed with Dr. Jobie Quaker ceftazidime 2g and follow up 04/06/16 with home teaching at dialysis center -04/04/16 random vanc level 23  Peritonitis--peritoneal dialysis related -PD fluid cultures have been negative -WBC trending down on serial PD fluid analyses -04/01/2016 PD fluid WBC 129 -Continue vancomycin and ceftazidime IV -Appreciate nephrology follow-up -follow PD fluid and blood cultures--neg to date  Acute toxic and metabolic encephalopathy -Suspect this is related to the patient's opioids as well as infection -Patient will have fine balance between pain control and confusion -04/03/16--appears more alert  -04/04/16--back to baseline per wife  ESRD on PD -appreciate nephrology followup  HTN -d/c'ed am dose lisinopril due to soft BP -d/c'ed amlodipine due to soft BP -continue metoprolol succinate and pm lisinopril  Chronic systolic CHF -[id number] echo EF 30-35% -Clinically  compensated  Diabetes mellitus type 2 -04/01/2016 hemoglobin A1c 5.5 -CBGs well-controlled -Continue NovoLog sliding scale   Discharge Instructions     Medication List    STOP  taking these medications   amLODipine 5 MG tablet Commonly known as:  NORVASC     TAKE these medications   acetaminophen 500 MG tablet Commonly known as:  TYLENOL Take 1,000 mg by mouth every 6 (six) hours as needed for mild pain or headache.   ALPRAZolam 0.5 MG tablet Commonly known as:  XANAX Take 0.5 mg by mouth at bedtime.   aspirin 325 MG EC tablet Take 325 mg by mouth daily.   calcium acetate 667 MG capsule Commonly known as:  PHOSLO Take 667-1,334 mg by mouth See admin instructions. Take 2 capsules (1334 mg) every morning and at lunch. Depending on the evening meal, takes 667 mg (1 tablet) or 1334 mg (2 tablets)   chlorhexidine 0.12 % solution Commonly known as:  PERIDEX Use as directed 15 mLs in the mouth or throat at bedtime.   DOCOSAHEXAENOIC ACID PO Take 2 g by mouth every morning.   gabapentin 100 MG capsule Commonly known as:  NEURONTIN Take 100 mg by mouth 2 (two) times daily.   gentamicin cream 0.1 % Commonly known as:  GARAMYCIN Apply 1 application topically daily.   glipiZIDE 2.5 MG 24 hr tablet Commonly known as:  GLUCOTROL XL Take 2.5 mg by mouth daily as needed (for high blood sugar).   ipratropium 0.06 % nasal spray Commonly known as:  ATROVENT Place 2 sprays into both nostrils 2 (two) times daily.   lisinopril 20 MG tablet Commonly known as:  PRINIVIL,ZESTRIL Take 20 mg by mouth at bedtime. What changed:  Another medication with the same name was removed. Continue taking this medication, and follow the directions you see here.   metoprolol succinate 50 MG 24 hr tablet Commonly known as:  TOPROL-XL Take 50 mg by mouth 2 (two) times daily. Take with or immediately following a meal.   multivitamin Tabs tablet Take 1 tablet by mouth daily.   mupirocin  ointment 2 % Commonly known as:  BACTROBAN Apply 1 application topically 2 (two) times daily as needed (rash).   nitroGLYCERIN 0.4 MG SL tablet Commonly known as:  NITROSTAT Place 1 tablet (0.4 mg total) under the tongue every 5 (five) minutes as needed for chest pain.   omeprazole 20 MG capsule Commonly known as:  PRILOSEC Take 20 mg by mouth every morning.   oxyCODONE-acetaminophen 5-325 MG tablet Commonly known as:  PERCOCET/ROXICET Take 1 tablet by mouth every 6 (six) hours as needed for severe pain.   rosuvastatin 5 MG tablet Commonly known as:  CRESTOR Take 1 tablet by mouth as directed up to once daily What changed:  how much to take  how to take this  when to take this  additional instructions   traMADol 50 MG tablet Commonly known as:  ULTRAM Take 100 mg by mouth every 4 (four) hours as needed (pain).   triamcinolone cream 0.1 % Commonly known as:  KENALOG Apply 1 application topically 2 (two) times daily as needed (rash).       Allergies  Allergen Reactions  . Fentanyl Other (See Comments)    Confusion when patch applied  . Nsaids Other (See Comments)    Severe kidney disease/ESRD  . Amoxicillin Diarrhea and Nausea And Vomiting  . Moxifloxacin Diarrhea and Nausea And Vomiting    Consultations:  Nephrology  GI--Outlaw  Vascular surgery--[place]  General surgery   Procedures/Studies: Dg Chest 2 View  Result Date: 04/01/2016 CLINICAL DATA:  Suspected sepsis.  Peritoneal dialysis patient. EXAM: CHEST  2 VIEW COMPARISON:  Chest  radiograph 07/19/2015.  Abdominal CT 03/28/2016 FINDINGS: Stable cardiomegaly and mediastinal contours. There is thoracic aortic atherosclerosis. Small bilateral pleural effusions and bibasilar airspace disease. No pulmonary edema. No pneumothorax. Vascular stent in the left axilla. Free air under the right hemidiaphragm, likely related to peritoneal dialysis. Resorption of the distal right clavicle. IMPRESSION: Chronic  cardiomegaly, stable.  Thoracic aortic atherosclerosis. Small bilateral pleural effusions with bibasilar airspace disease, likely atelectasis. Electronically Signed   By: Jeb Levering M.D.   On: 04/01/2016 05:40   Ct Abdomen Pelvis W Contrast  Result Date: 03/29/2016 CLINICAL DATA:  Acute onset of generalized abdominal pain. Recent peritoneal dialysis. Vomiting and diarrhea. Initial encounter. EXAM: CT ABDOMEN AND PELVIS WITH CONTRAST TECHNIQUE: Multidetector CT imaging of the abdomen and pelvis was performed using the standard protocol following bolus administration of intravenous contrast. CONTRAST:  171mL ISOVUE-300 IOPAMIDOL (ISOVUE-300) INJECTION 61% COMPARISON:  CT of the abdomen and pelvis from 11/26/2014 FINDINGS: Lower chest: Trace right-sided pleural fluid is noted. Bibasilar atelectasis or scarring is seen. The visualized portions of the mediastinum are unremarkable. Hepatobiliary: A small amount of ascites is seen tracking about the liver. The liver is unremarkable in appearance. The patient is status post cholecystectomy, with clips noted at the gallbladder fossa. The common bile duct remains normal in caliber. Pancreas: The pancreas is within normal limits. Spleen: The spleen is unremarkable in appearance. Adrenals/Urinary Tract: The adrenal glands are grossly unremarkable in appearance. Severe chronic bilateral renal atrophy is noted, with scattered bilateral renal cysts. There is no evidence of hydronephrosis. No renal or ureteral stones are identified. No significant perinephric stranding is seen. Stomach/Bowel: The stomach is unremarkable in appearance. The small bowel is within normal limits. The appendix is normal in caliber, without evidence of appendicitis. Scattered diverticulosis is noted along the distal descending and sigmoid colon, without definite evidence of diverticulitis. Vascular/Lymphatic: Diffuse calcification is seen along the abdominal aorta and its branches. There is  relatively severe luminal narrowing at the proximal right common iliac artery, and moderate luminal narrowing along the left common iliac artery. Relatively severe luminal narrowing is also noted along the proximal left superficial femoral artery. No retroperitoneal or pelvic sidewall lymphadenopathy is seen. Reproductive: The bladder is mildly distended and grossly unremarkable. The prostate remains normal in size. Other: The patient's peritoneal dialysis catheter is noted within the pelvis, with a small to moderate amount of free fluid in the pelvis and along both paracolic gutters. Musculoskeletal: No acute osseous abnormalities are identified. The visualized musculature is unremarkable in appearance. IMPRESSION: 1. No acute abnormality seen to explain the patient's symptoms. 2. Status post recent peritoneal dialysis. Small to moderate volume ascites noted within the abdomen and pelvis. 3. Diffuse aortic atherosclerosis noted. Relatively severe luminal narrowing at the proximal right common iliac artery, and moderate luminal narrowing along the left common iliac artery. Relatively severe luminal narrowing also noted along the proximal left superficial femoral artery. 4. Severe chronic bilateral renal atrophy, with scattered bilateral renal cysts. 5. Scattered diverticulosis along the distal descending and sigmoid colon, without definite evidence of diverticulitis. 6. Trace right-sided pleural fluid. Bibasilar atelectasis or scarring noted. Electronically Signed   By: Garald Balding M.D.   On: 03/29/2016 00:10   Ct Angio Abdomen Pelvis  W &/or Wo Contrast  Result Date: 04/03/2016 CLINICAL DATA:  70 year old male with abdominal pain. Negative lactic acidosis. Renal failure with peritoneal dialysis catheter. EXAM: CT ANGIOGRAPHY  ABDOMEN AND PELVIS TECHNIQUE: Multidetector CT imaging through the abdomen and pelvis was performed using  the standard protocol during bolus administration of intravenous contrast.  Delayed images included. Multiplanar reconstructed images including MIPs were obtained and reviewed to evaluate the vascular anatomy. CONTRAST:  100 cc Isovue 370 COMPARISON:  CT angiogram done the previous day, CT 03/28/2016, CT 11/26/2014 FINDINGS: VASCULAR Aorta: Extensive mixed calcified and soft plaque of the lower thoracic and abdominal aorta. No aneurysm. No dissection. No periaortic fluid. Celiac: Similar appearance of atherosclerotic changes at the origin of the celiac artery, which is patent though perhaps with 50% or greater stenosis. Post stenotic dilation. Typical branch pattern again noted, discussed on the prior CT. SMA: Similar stenotic appearance of the superior mesenteric artery with mixed calcified and soft plaque. Greater than 50% stenosis. Distal branches appear patent. Renals: Calcified and soft plaque at the origin the bilateral renal arteries. Single bilateral renal arteries. IMA: Patent IMA. Likely stenosis at the origin. Difficult to quantify degree of stenosis. Right lower extremity: Mixed calcified and soft plaque of the right iliac system. Again, likely greater than 50% stenosis in the proximal right common iliac artery. Hypogastric artery and external iliac artery patent. Atherosclerotic disease throughout the external iliac artery. Common femoral artery in the proximal full more all vessels patent. Left lower extremity: Mixed calcified and soft plaque of the left iliac system. Likely stenosis at the distal common iliac artery. Hypogastric artery and external iliac artery patent. Disease through the length of the external iliac artery. Common femoral artery in the proximal fill more 0 vasculature patent on today's exam. Veins: Portal venous system remains patent. Unremarkable appearance of the systemic venous vasculature. Review of the MIP images confirms the above findings. NON-VASCULAR Lower chest: Respiratory motion somewhat limits evaluation of the lung bases. Coverage of the lower  chest/ field-of-view larger on the current study. Plaque like thickening of the left sided pleura at the lung base. This has been present since the CT 11/26/2014 Coronary calcifications.  Aortic valve calcifications. Hepatobiliary: Morphology of the liver compatible with cirrhosis with enlarged left lobe, caudate lobe, and nodular configuration. No focal lesion identified. Pancreas: Unremarkable appearance of pancreas Spleen: Borderline enlarged spleen Adrenals/Urinary Tract: Unremarkable appearance of adrenal glands. Right: Severe renal cortical thinning. No hydronephrosis or nephrolithiasis. Hypodense lesion on the lateral cortex and at the superior cortex too small to characterize. Left: Severe renal cortical thinning. No hydronephrosis. Similar appearance of low-density cystic structures, the larger of which are compatible with simple cysts. Smaller are too small to characterize. Excreted contrast within the urinary bladder. Stomach/Bowel: Unremarkable stomach. Small hiatal hernia. No evidence of small bowel obstruction or transition point. Minimal thickening of small bowel wall, persists after the prior CT. Small bowel wall enhances on the arterial and venous phase, with no CT evidence of ischemia. Retained oral contrast within colon, limiting evaluation of the mucosal surface. No evidence of obstruction. Small stool burden. Diverticular disease. Normal appendix. Lymphatic: Low-density ascites within the pericolic gutter, right upper quadrant, and within the dependent pelvis associated with the peritoneal dialysis catheter. No enhancement of the surface is the peritoneum and. Vascular/lymphatic congestion within small bowel mesenteric, similar to prior. Small lymph nodes, none of which are enlarged. Mesenteric: Fluid throughout the peritoneal of small volume. Small amount of anti dependent gas, similar to prior. Peritoneal dialysis catheter terminates within the low pelvis, adjacent to the rectum. Reproductive:  Unremarkable appearance of the pelvic organs. Other: Small fat containing umbilical hernia. Musculoskeletal: Sclerotic appearance of the skeletal elements. No acute bony abnormality. IMPRESSION: Vascular: No acute vascular abnormality. Similar to yesterday's  study there is diffuse atherosclerotic changes of the abdominal aorta, with involvement of the mesenteric vasculature. Again, there is likely greater than 50% stenosis at the celiac artery and IMA artery, however, CTA resolution difficult to make precise assessment. The degree of stenosis at the SMA origin is likely high-grade given the post stenotic dilation and degree of disease. Distal branches of the mesenteric vessels remain patent. Bilateral renal artery stenosis. Portal venous system unremarkable. Systemic venous system unremarkable. Nonvascular: Morphology of the liver compatible with cirrhosis. Associated borderline splenomegaly, indicating developing portal hypertension. No evidence of bowel ischemia, with uniform enhancement of stomach and small bowel. Although the colon wall cannot be assessed with the presence of positive enteric contrast, there are no focal findings to suggest colonic ischemia. Temporal resolution of mild small bowel thickening is improved with 24 hour follow-up CT, with mild thickening of the small bowel in the mid abdomen. There is associated lymphatic congestion of the small bowel mesenteric with associated small lymph nodes. Finding is nonspecific. There is no evidence of venous obstruction per se, although these findings can be seen with portal colopathy/enteropathy in the setting of cirrhosis and developing portal hypertension. Alternative consideration would be an nonspecific inflammatory or infectious enteritis. These results were called by telephone at the time of interpretation on 04/03/2016 at 8:56 am to Dr. Carles Collet, who verbally acknowledged these results. Small volume of low-density ascites, likely related to PD catheter.  PD catheter within the dependent pelvis adjacent to rectum. No evidence of peritonitis on the contrast-enhanced exam. Persistent thickening of the left posterior lower pleura, unchanged from the CT 11/26/2014. This may reflect rounded atelectasis/scarring. Further evaluation with chest CT may be considered if the patient has a history of carcinoma. Coronary artery disease.  Aortic valve calcifications. Signed, Dulcy Fanny. Earleen Newport, DO [facility] Electronically Signed   By: Corrie Mckusick D.O.   On: 04/03/2016 08:59   Ct Angio Abd/pel W And/or Wo Contrast  Result Date: 04/01/2016 CLINICAL DATA:  70 year old male with a history of lower abdominal pain and nausea vomiting. Constipation. EXAM: CTA ABDOMEN AND PELVIS WITH CONTRAST TECHNIQUE: Multidetector CT imaging of the abdomen and pelvis was performed using the standard protocol during bolus administration of intravenous contrast. Multiplanar reconstructed images and MIPs were obtained and reviewed to evaluate the vascular anatomy. CONTRAST:  75 cc Isovue 370 COMPARISON:  CT 03/28/2016, 11/26/2014, 08/06/2013 FINDINGS: VASCULAR Aorta: Diffuse atherosclerosis of the abdominal aorta with soft plaque and calcified plaque. No dissection. No aneurysm. No periaortic fluid. Celiac: Atherosclerotic changes at the origin of the celiac artery which is patent. Post stenotic dilation. Typical branch pattern of the celiac artery, with left gastric, common hepatic, and splenic artery identified. SMA: Atherosclerotic changes of the superior mesenteric artery, with mixed calcified and soft plaque at the origin. The SMA appears patent, with greater than 50% stenosis. Renals: Dense calcified and soft plaque at the origin the bilateral renal arteries. Renal arteries are patent. Single left and single right renal artery. IMA: Inferior mesenteric artery is patent, though appears stenotic at the origin. Left colic artery patent.  Superior rectal artery patent. Right lower extremity: Dense calcified and soft plaque involving the right iliac system, including common iliac artery and external iliac artery. Small caliber vessels, with likely multifocal stenoses. Greatest degree of stenosis may be at the distal common iliac artery above the hypogastric origin. Hypogastric artery is patent. Common femoral artery patent. Proximal SFA and profunda femoris patent. Left lower extremity: Mixed calcified  and soft plaque of the left iliac system. Greatest degree of stenosis at the origin of the external iliac artery. Hypogastric artery patent. Common femoral artery patent. Proximal SFA and profunda femoris patent. Veins: Unremarkable appearance of the venous system. Review of the MIP images confirms the above findings. NON-VASCULAR Lower chest: Atelectatic changes at the lung bases. Small hiatal hernia. Hepatobiliary: Morphologic changes of the liver compatible with cirrhosis with developing left hepatic enlargement. Nodular contour. No focal lesion identified on the current CT. No intrahepatic or extrahepatic biliary ductal dilatation. Cholecystectomy Pancreas: Unremarkable appearance of the pancreas. No pericholecystic fluid or inflammatory changes. Unremarkable ductal system. Spleen: Borderline splenomegaly. Adrenals/Urinary Tract: Unremarkable appearance of adrenal glands. Right: No hydronephrosis. Advanced cortical renal thinning. Symmetric perfusion to the left. Hypodense lesions within the cortex of the right kidney, the majority are too small to characterize though arm most likely benign cysts. Left: No hydronephrosis. Advanced cortical renal thinning come symmetric to the right. Symmetric perfusion. Cystic lesions in the cortex of the left kidney, most likely all benign cysts although the majority are too small to characterize. Unremarkable appearance of the urinary bladder with high density excreted contrast. Stomach/Bowel: Unremarkable  appearance of the stomach. Small hiatal hernia. Unremarkable appearance of small bowel. Venous phase demonstrates enhancement of the small bowel and colon, although the colon somewhat limited by the presence of enteric contrast. Colonic diverticula. Lymphatic: Multiple lymph nodes in the para-aortic nodal station, none of which are enlarged. Mesenteric: Low-density free fluid within the abdomen, associated with peritoneal dialysis catheter. Small volume of free air in the anti dependent aspect. Reproductive: Unremarkable appearance of the prostate and pelvic organs. Other: Small fat containing umbilical hernia. Unremarkable appearance of the tract of the dialysis catheter. Musculoskeletal: No evidence of acute fracture. Developing sclerotic appearance of the skeletal structures, potentially early renal osteodystrophy. No bony canal narrowing. No significant degenerative changes of the hips. IMPRESSION: VASCULAR No acute vascular abnormality identified. Advanced aortic atherosclerosis. There are associated changes at the origin of all mesenteric vessel, though they remain patent. There may be 50 percent stenosis at the celiac artery origin, with greater than 50 percent stenosis of the superior mesenteric artery origin, and potentially greater than 50 percent stenosis at the origin of the IMA, although degree of stenosis cannot be determined by CTA. This pattern of disease certainly places the patient at risk for chronic mesenteric ischemia. Correlation with patient's history may be useful, and potentially mesenteric duplex exam if there is concern for compromise. Bilateral renal artery stenosis, potentially contributing to advanced renal cortical thinning. NON-VASCULAR Morphologic changes of the liver compatible with cirrhosis. Referral for establishing gastroenterology care may be considered if not already established, as well as consideration of initiating surveillance for hepatocellular carcinoma. Peritoneal  dialysis catheter with free fluid likely associated with treatment. Tiny amount of anti dependent gas within the abdomen, again likely associated with the peritoneal dialysis. Again, bilateral renal cortical cysts. While these are most likely benign, there is incomplete characterization given the size. Developing renal osteodystrophy. Signed, Dulcy Fanny. Earleen Newport, DO [facility] Electronically Signed   By: Corrie Mckusick D.O.   On: 04/01/2016 09:00         Discharge Exam: Vitals:   04/04/16 0800 04/04/16 0924  BP: 118/87 (!) 146/64  Pulse: 95 (!) 103  Resp: 11   Temp: 97.5 F (36.4 C)    Vitals:   04/04/16 0022 04/04/16 0355 04/04/16 0800 04/04/16 0924  BP: 101/71 (!) 111/55 118/87 [place] Kitchen)  146/64  Pulse: 97 (!) 105 95 (!) 103  Resp: 18 11 11    Temp: 97.9 F (36.6 C) 98 F (36.7 C) 97.5 F (36.4 C)   TempSrc: Oral Oral Oral   SpO2: 98% 97% 100%   Weight:      Height:        General: Pt is alert, awake, not in acute distress Cardiovascular: RRR, S1/S2 +, no rubs, no gallops Respiratory: bibasilar crackles Abdominal: Soft, RUQ, epigastric pain, RLQ pain, ND, bowel sounds + Extremities: no edema, no cyanosis   The results of significant diagnostics from this hospitalization (including imaging, microbiology, ancillary and laboratory) are listed below for reference.    Significant Diagnostic Studies: Dg Chest 2 View  Result Date: 04/01/2016 CLINICAL DATA:  Suspected sepsis.  Peritoneal dialysis patient. EXAM: CHEST  2 VIEW COMPARISON:  Chest radiograph 07/19/2015.  Abdominal CT 03/28/2016 FINDINGS: Stable cardiomegaly and mediastinal contours. There is thoracic aortic atherosclerosis. Small bilateral pleural effusions and bibasilar airspace disease. No pulmonary edema. No pneumothorax. Vascular stent in the left axilla. Free air under the right hemidiaphragm, likely related to peritoneal dialysis. Resorption of the distal right  clavicle. IMPRESSION: Chronic cardiomegaly, stable.  Thoracic aortic atherosclerosis. Small bilateral pleural effusions with bibasilar airspace disease, likely atelectasis. Electronically Signed   By: Jeb Levering M.D.   On: 04/01/2016 05:40   Ct Abdomen Pelvis W Contrast  Result Date: 03/29/2016 CLINICAL DATA:  Acute onset of generalized abdominal pain. Recent peritoneal dialysis. Vomiting and diarrhea. Initial encounter. EXAM: CT ABDOMEN AND PELVIS WITH CONTRAST TECHNIQUE: Multidetector CT imaging of the abdomen and pelvis was performed using the standard protocol following bolus administration of intravenous contrast. CONTRAST:  169mL ISOVUE-300 IOPAMIDOL (ISOVUE-300) INJECTION 61% COMPARISON:  CT of the abdomen and pelvis from 11/26/2014 FINDINGS: Lower chest: Trace right-sided pleural fluid is noted. Bibasilar atelectasis or scarring is seen. The visualized portions of the mediastinum are unremarkable. Hepatobiliary: A small amount of ascites is seen tracking about the liver. The liver is unremarkable in appearance. The patient is status post cholecystectomy, with clips noted at the gallbladder fossa. The common bile duct remains normal in caliber. Pancreas: The pancreas is within normal limits. Spleen: The spleen is unremarkable in appearance. Adrenals/Urinary Tract: The adrenal glands are grossly unremarkable in appearance. Severe chronic bilateral renal atrophy is noted, with scattered bilateral renal cysts. There is no evidence of hydronephrosis. No renal or ureteral stones are identified. No significant perinephric stranding is seen. Stomach/Bowel: The stomach is unremarkable in appearance. The small bowel is within normal limits. The appendix is normal in caliber, without evidence of appendicitis. Scattered diverticulosis is noted along the distal descending and sigmoid colon, without definite evidence of diverticulitis. Vascular/Lymphatic: Diffuse calcification is seen along the abdominal aorta  and its branches. There is relatively severe luminal narrowing at the proximal right common iliac artery, and moderate luminal narrowing along the left common iliac artery. Relatively severe luminal narrowing is also noted along the proximal left superficial femoral artery. No retroperitoneal or pelvic sidewall lymphadenopathy is seen. Reproductive: The bladder is mildly distended and grossly unremarkable. The prostate remains normal in size. Other: The patient's peritoneal dialysis catheter is noted within the pelvis, with a small to moderate amount of free fluid in the pelvis and along both paracolic gutters. Musculoskeletal: No acute osseous abnormalities are identified. The visualized musculature is unremarkable in appearance. IMPRESSION: 1. No acute abnormality seen to explain the patient's symptoms. 2. Status post recent peritoneal dialysis. Small to moderate volume ascites noted  within the abdomen and pelvis. 3. Diffuse aortic atherosclerosis noted. Relatively severe luminal narrowing at the proximal right common iliac artery, and moderate luminal narrowing along the left common iliac artery. Relatively severe luminal narrowing also noted along the proximal left superficial femoral artery. 4. Severe chronic bilateral renal atrophy, with scattered bilateral renal cysts. 5. Scattered diverticulosis along the distal descending and sigmoid colon, without definite evidence of diverticulitis. 6. Trace right-sided pleural fluid. Bibasilar atelectasis or scarring noted. Electronically Signed   By: Garald Balding M.D.   On: 03/29/2016 00:10   Ct Angio Abdomen Pelvis  W &/or Wo Contrast  Result Date: 04/03/2016 CLINICAL DATA:  70 year old male with abdominal pain. Negative lactic acidosis. Renal failure with peritoneal dialysis catheter. EXAM: CT ANGIOGRAPHY  ABDOMEN AND PELVIS TECHNIQUE: Multidetector CT imaging through the abdomen and pelvis was performed using the standard protocol during bolus administration  of intravenous contrast. Delayed images included. Multiplanar reconstructed images including MIPs were obtained and reviewed to evaluate the vascular anatomy. CONTRAST:  100 cc Isovue 370 COMPARISON:  CT angiogram done the previous day, CT 03/28/2016, CT 11/26/2014 FINDINGS: VASCULAR Aorta: Extensive mixed calcified and soft plaque of the lower thoracic and abdominal aorta. No aneurysm. No dissection. No periaortic fluid. Celiac: Similar appearance of atherosclerotic changes at the origin of the celiac artery, which is patent though perhaps with 50% or greater stenosis. Post stenotic dilation. Typical branch pattern again noted, discussed on the prior CT. SMA: Similar stenotic appearance of the superior mesenteric artery with mixed calcified and soft plaque. Greater than 50% stenosis. Distal branches appear patent. Renals: Calcified and soft plaque at the origin the bilateral renal arteries. Single bilateral renal arteries. IMA: Patent IMA. Likely stenosis at the origin. Difficult to quantify degree of stenosis. Right lower extremity: Mixed calcified and soft plaque of the right iliac system. Again, likely greater than 50% stenosis in the proximal right common iliac artery. Hypogastric artery and external iliac artery patent. Atherosclerotic disease throughout the external iliac artery. Common femoral artery in the proximal full more all vessels patent. Left lower extremity: Mixed calcified and soft plaque of the left iliac system. Likely stenosis at the distal common iliac artery. Hypogastric artery and external iliac artery patent. Disease through the length of the external iliac artery. Common femoral artery in the proximal fill more 0 vasculature patent on today's exam. Veins: Portal venous system remains patent. Unremarkable appearance of the systemic venous vasculature. Review of the MIP images confirms the above findings. NON-VASCULAR Lower chest: Respiratory motion somewhat limits evaluation of the lung  bases. Coverage of the lower chest/ field-of-view larger on the current study. Plaque like thickening of the left sided pleura at the lung base. This has been present since the CT 11/26/2014 Coronary calcifications.  Aortic valve calcifications. Hepatobiliary: Morphology of the liver compatible with cirrhosis with enlarged left lobe, caudate lobe, and nodular configuration. No focal lesion identified. Pancreas: Unremarkable appearance of pancreas Spleen: Borderline enlarged spleen Adrenals/Urinary Tract: Unremarkable appearance of adrenal glands. Right: Severe renal cortical thinning. No hydronephrosis or nephrolithiasis. Hypodense lesion on the lateral cortex and at the superior cortex too small to characterize. Left: Severe renal cortical thinning. No hydronephrosis. Similar appearance of low-density cystic structures, the larger of which are compatible with simple cysts. Smaller are too small to characterize. Excreted contrast within the urinary bladder. Stomach/Bowel: Unremarkable stomach. Small hiatal hernia. No evidence of small bowel obstruction or transition point. Minimal thickening of small bowel wall, persists after the prior CT. Small bowel wall  enhances on the arterial and venous phase, with no CT evidence of ischemia. Retained oral contrast within colon, limiting evaluation of the mucosal surface. No evidence of obstruction. Small stool burden. Diverticular disease. Normal appendix. Lymphatic: Low-density ascites within the pericolic gutter, right upper quadrant, and within the dependent pelvis associated with the peritoneal dialysis catheter. No enhancement of the surface is the peritoneum and. Vascular/lymphatic congestion within small bowel mesenteric, similar to prior. Small lymph nodes, none of which are enlarged. Mesenteric: Fluid throughout the peritoneal of small volume. Small amount of anti dependent gas, similar to prior. Peritoneal dialysis catheter terminates within the low pelvis, adjacent  to the rectum. Reproductive: Unremarkable appearance of the pelvic organs. Other: Small fat containing umbilical hernia. Musculoskeletal: Sclerotic appearance of the skeletal elements. No acute bony abnormality. IMPRESSION: Vascular: No acute vascular abnormality. Similar to yesterday's study there is diffuse atherosclerotic changes of the abdominal aorta, with involvement of the mesenteric vasculature. Again, there is likely greater than 50% stenosis at the celiac artery and IMA artery, however, CTA resolution difficult to make precise assessment. The degree of stenosis at the SMA origin is likely high-grade given the post stenotic dilation and degree of disease. Distal branches of the mesenteric vessels remain patent. Bilateral renal artery stenosis. Portal venous system unremarkable. Systemic venous system unremarkable. Nonvascular: Morphology of the liver compatible with cirrhosis. Associated borderline splenomegaly, indicating developing portal hypertension. No evidence of bowel ischemia, with uniform enhancement of stomach and small bowel. Although the colon wall cannot be assessed with the presence of positive enteric contrast, there are no focal findings to suggest colonic ischemia. Temporal resolution of mild small bowel thickening is improved with 24 hour follow-up CT, with mild thickening of the small bowel in the mid abdomen. There is associated lymphatic congestion of the small bowel mesenteric with associated small lymph nodes. Finding is nonspecific. There is no evidence of venous obstruction per se, although these findings can be seen with portal colopathy/enteropathy in the setting of cirrhosis and developing portal hypertension. Alternative consideration would be an nonspecific inflammatory or infectious enteritis. These results were called by telephone at the time of interpretation on 04/03/2016 at 8:56 am to Dr. Carles Collet, who verbally acknowledged these results. Small volume of low-density ascites,  likely related to PD catheter. PD catheter within the dependent pelvis adjacent to rectum. No evidence of peritonitis on the contrast-enhanced exam. Persistent thickening of the left posterior lower pleura, unchanged from the CT 11/26/2014. This may reflect rounded atelectasis/scarring. Further evaluation with chest CT may be considered if the patient has a history of carcinoma. Coronary artery disease.  Aortic valve calcifications. Signed, Dulcy Fanny. Earleen Newport, DO [facility] Electronically Signed   By: Corrie Mckusick D.O.   On: 04/03/2016 08:59   Ct Angio Abd/pel W And/or Wo Contrast  Result Date: 04/01/2016 CLINICAL DATA:  70 year old male with a history of lower abdominal pain and nausea vomiting. Constipation. EXAM: CTA ABDOMEN AND PELVIS WITH CONTRAST TECHNIQUE: Multidetector CT imaging of the abdomen and pelvis was performed using the standard protocol during bolus administration of intravenous contrast. Multiplanar reconstructed images and MIPs were obtained and reviewed to evaluate the vascular anatomy. CONTRAST:  75 cc Isovue 370 COMPARISON:  CT 03/28/2016, 11/26/2014, 08/06/2013 FINDINGS: VASCULAR Aorta: Diffuse atherosclerosis of the abdominal aorta with soft plaque and calcified plaque. No dissection. No aneurysm. No periaortic fluid. Celiac: Atherosclerotic changes at the origin of the celiac artery which is patent. Post stenotic dilation. 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 SMA: Atherosclerotic changes of the superior mesenteric artery, with mixed calcified and soft plaque at the origin. The SMA appears patent, with greater than 50% stenosis. Renals: Dense calcified and soft plaque at the origin the bilateral renal arteries. Renal arteries are patent. Single left and single right renal artery. IMA: Inferior mesenteric artery is patent, though appears stenotic at the  origin. Left colic artery patent. Superior rectal artery patent. Right lower extremity: Dense calcified and soft plaque involving the right iliac system, including common iliac artery and external iliac artery. Small caliber vessels, with likely multifocal stenoses. Greatest degree of stenosis may be at the distal common iliac artery above the hypogastric origin. Hypogastric artery is patent. Common femoral artery patent. Proximal SFA and profunda femoris patent. Left lower extremity: Mixed calcified and soft plaque of the left iliac system. Greatest degree of stenosis at the origin of the external iliac artery. Hypogastric artery patent. Common femoral artery patent. Proximal SFA and profunda femoris patent. Veins: Unremarkable appearance of the venous system. Review of the MIP images confirms the above findings. NON-VASCULAR Lower chest: Atelectatic changes at the lung bases. Small hiatal hernia. Hepatobiliary: Morphologic changes of the liver compatible with cirrhosis with developing left hepatic enlargement. Nodular contour. No focal lesion identified on the current CT. No intrahepatic or extrahepatic biliary ductal dilatation. Cholecystectomy Pancreas: Unremarkable appearance of the pancreas. No pericholecystic fluid or inflammatory changes. Unremarkable ductal system. Spleen: Borderline splenomegaly. Adrenals/Urinary Tract: Unremarkable appearance of adrenal glands. Right: No hydronephrosis. Advanced cortical renal thinning. Symmetric perfusion to the left. Hypodense lesions within the cortex of the right kidney, the majority are too small to characterize though arm most likely benign cysts. Left: No hydronephrosis. Advanced cortical renal thinning come symmetric to the right. Symmetric perfusion. Cystic lesions in the cortex of the left kidney, most likely all benign cysts although the majority are too small to characterize. Unremarkable appearance of the urinary bladder with high density excreted contrast.  Stomach/Bowel: Unremarkable appearance of the stomach. Small hiatal hernia. Unremarkable appearance of small bowel. Venous phase demonstrates enhancement of the small bowel and colon, although the colon somewhat limited by the presence of enteric contrast. Colonic diverticula. Lymphatic: Multiple lymph nodes in the para-aortic nodal station, none of which are enlarged. Mesenteric: Low-density free fluid within the abdomen, associated with peritoneal dialysis catheter. Small volume of free air in the anti dependent aspect. Reproductive: Unremarkable appearance of the prostate and pelvic organs. Other: Small fat containing umbilical hernia. Unremarkable appearance of the tract of the dialysis catheter. Musculoskeletal: No evidence of acute fracture. Developing sclerotic appearance of the skeletal structures, potentially early renal osteodystrophy. No bony canal narrowing. No significant degenerative changes of the hips. IMPRESSION: VASCULAR No acute vascular abnormality identified. Advanced aortic atherosclerosis. There are associated changes at the origin of all mesenteric vessel, though they remain patent. There may be 50 percent stenosis at the celiac artery origin, with greater than 50 percent stenosis of the superior mesenteric artery origin, and potentially greater than 50 percent stenosis at the origin of the IMA, although degree of stenosis cannot be determined by CTA. This pattern of disease certainly places the patient at risk for chronic mesenteric ischemia. Correlation with patient's history may be useful, and potentially mesenteric duplex exam if there is concern for compromise. Bilateral renal artery stenosis, potentially contributing to advanced renal cortical thinning. NON-VASCULAR Morphologic changes of the liver compatible with cirrhosis. Referral for establishing gastroenterology care may be considered  if not already established, as well as consideration of initiating surveillance for hepatocellular  carcinoma. Peritoneal dialysis catheter with free fluid likely associated with treatment. Tiny amount of anti dependent gas within the abdomen, again likely associated with the peritoneal dialysis. Again, bilateral renal cortical cysts. While these are most likely benign, there is incomplete characterization given the size. Developing renal osteodystrophy. Signed, Dulcy Fanny. Earleen Newport, DO [facility] [place] [facility] Electronically Signed   By: Corrie Mckusick D.O.   On: 04/01/2016 09:00     Microbiology: Recent Results (from the past 240 hour(s))  Gram stain     Status: None   Collection Time: 03/29/16  1:12 AM  Result Value Ref Range Status   Specimen Description FLUID  Final   Special Requests PERITONEAL  Final   Gram Stain   Final    CYTOSPIN SMEAR WBC PRESENT,BOTH PMN AND MONONUCLEAR NO ORGANISMS SEEN    Report Status 03/29/2016 FINAL  Final  Culture, body fluid-bottle     Status: None   Collection Time: 03/29/16  1:12 AM  Result Value Ref Range Status   Specimen Description FLUID  Final   Special Requests PERITONEAL BOTTLES DRAWN AEROBIC AND ANAEROBIC  Final   Culture NO GROWTH 5 DAYS  Final   Report Status 04/03/2016 FINAL  Final  Culture, blood (Routine x 2)     Status: None (Preliminary result)   Collection Time: 04/01/16  5:07 AM  Result Value Ref Range Status   Specimen Description BLOOD RIGHT ARM  Final   Special Requests BOTTLES DRAWN AEROBIC AND ANAEROBIC 5ML  Final   Culture NO GROWTH 3 DAYS  Final   Report Status PENDING  Incomplete  Culture, blood (Routine x 2)     Status: None (Preliminary result)   Collection Time: 04/01/16  5:50 AM  Result Value Ref Range Status   Specimen Description BLOOD RIGHT HAND  Final   Special Requests AEROBIC BOTTLE ONLY 5ML  Final   Culture NO GROWTH 3 DAYS  Final   Report Status PENDING  Incomplete  Culture, body fluid-bottle     Status: None (Preliminary result)   Collection Time: 04/01/16  8:12  AM  Result Value Ref Range Status   Specimen Description PERITONEAL  Final   Special Requests NONE  Final   Culture NO GROWTH 3 DAYS  Final   Report Status PENDING  Incomplete  Gram stain     Status: None   Collection Time: 04/01/16  8:12 AM  Result Value Ref Range Status   Specimen Description PERITONEAL  Final   Special Requests NONE  Final   Gram Stain   Final    FEW WBC PRESENT, PREDOMINANTLY PMN NO ORGANISMS SEEN    Report Status 04/01/2016 FINAL  Final  MRSA PCR Screening     Status: None   Collection Time: 04/01/16  5:49 PM  Result Value Ref Range Status   MRSA by PCR NEGATIVE NEGATIVE Final    Comment:        The GeneXpert MRSA Assay (FDA approved for NASAL specimens only), is one component of a comprehensive MRSA colonization surveillance program. It is not intended to diagnose MRSA infection nor to guide or monitor treatment for MRSA infections.      Labs: Basic Metabolic Panel:  Recent Labs Lab 03/28/16 1942 04/01/16 0507 04/02/16 0217 04/04/16 0426  NA 133* 134* 132* 132*  K 2.9* 2.5* 3.2* 2.7*  CL 88* 90* 93* 92*  CO2 28 26 24  25  GLUCOSE 98 164* 111* 143*  BUN 43* 25* 33* 32*  CREATININE 9.59* 8.45* 9.96* 10.52*  CALCIUM 9.0 9.3 8.6* 9.0  PHOS  --   --   --  6.5*   Liver Function Tests:  Recent Labs Lab 03/28/16 1942 04/01/16 0507 04/02/16 0217 04/04/16 0426  AST 17 23 23   --   ALT 23 24 22   --   ALKPHOS 100 107 96  --   BILITOT 0.2* 0.6 0.4  --   PROT 6.6 6.7 5.6*  --   ALBUMIN 2.5* 2.5* 2.1* 2.0*    Recent Labs Lab 03/28/16 1942 04/02/16 0814  LIPASE 102* 21  AMYLASE  --  82   No results for input(s): AMMONIA in the last 168 hours. CBC:  Recent Labs Lab 03/28/16 1942 04/01/16 0507 04/02/16 0217 04/04/16 0425  WBC 12.5* 10.4 14.5* 13.9*  NEUTROABS  --  7.9*  --   --   HGB 10.8* 11.3* 10.7* 10.2*  HCT 34.5* 35.7* 34.2* 33.1*  MCV 107.5* 106.6* 105.9* 106.8*  PLT 314 390 302 304   Cardiac Enzymes: No results for  input(s): CKTOTAL, CKMB, CKMBINDEX, TROPONINI in the last 168 hours. BNP: Invalid input(s): POCBNP CBG:  Recent Labs Lab 04/03/16 1147 04/03/16 1740 04/03/16 1958 04/03/16 2134 04/04/16 0759  GLUCAP 88 107* 107* 134* 161*    Time coordinating discharge:  Greater than 30 minutes  Signed:  Aisa Schoeppner, DO [facility] Pager: [phone] 04/04/2016, 11:57 AM

## 2016-04-04 NOTE — Progress Notes (Signed)
Patient requesting to leave AMA. Educated patient/wife on need to stay in hospital. Continue to request to leave. Notified Dr. Carles Collet of patient status.

## 2016-04-04 NOTE — Progress Notes (Signed)
Patient left AMA.

## 2016-04-06 LAB — CULTURE, BLOOD (ROUTINE X 2)
CULTURE: NO GROWTH
Culture: NO GROWTH

## 2016-04-07 LAB — CULTURE, BODY FLUID-BOTTLE

## 2016-04-07 LAB — CULTURE, BODY FLUID W GRAM STAIN -BOTTLE: Culture: NO GROWTH

## 2016-04-09 ENCOUNTER — Emergency Department (HOSPITAL_COMMUNITY): Payer: Medicare Other

## 2016-04-09 ENCOUNTER — Inpatient Hospital Stay (HOSPITAL_COMMUNITY): Admission: RE | Admit: 2016-04-09 | Payer: Medicare Other | Source: Ambulatory Visit

## 2016-04-09 ENCOUNTER — Other Ambulatory Visit (HOSPITAL_COMMUNITY): Payer: Self-pay | Admitting: Interventional Radiology

## 2016-04-09 ENCOUNTER — Inpatient Hospital Stay (HOSPITAL_COMMUNITY)
Admission: EM | Admit: 2016-04-09 | Discharge: 2016-04-22 | DRG: 981 | Disposition: E | Payer: Medicare Other | Attending: Internal Medicine | Admitting: Internal Medicine

## 2016-04-09 DIAGNOSIS — I959 Hypotension, unspecified: Secondary | ICD-10-CM | POA: Diagnosis present

## 2016-04-09 DIAGNOSIS — E1142 Type 2 diabetes mellitus with diabetic polyneuropathy: Secondary | ICD-10-CM | POA: Diagnosis present

## 2016-04-09 DIAGNOSIS — K219 Gastro-esophageal reflux disease without esophagitis: Secondary | ICD-10-CM | POA: Diagnosis present

## 2016-04-09 DIAGNOSIS — M549 Dorsalgia, unspecified: Secondary | ICD-10-CM

## 2016-04-09 DIAGNOSIS — I5022 Chronic systolic (congestive) heart failure: Secondary | ICD-10-CM | POA: Diagnosis not present

## 2016-04-09 DIAGNOSIS — G9340 Encephalopathy, unspecified: Secondary | ICD-10-CM | POA: Diagnosis not present

## 2016-04-09 DIAGNOSIS — R1319 Other dysphagia: Secondary | ICD-10-CM | POA: Diagnosis present

## 2016-04-09 DIAGNOSIS — R630 Anorexia: Secondary | ICD-10-CM | POA: Diagnosis present

## 2016-04-09 DIAGNOSIS — M545 Low back pain: Secondary | ICD-10-CM | POA: Diagnosis not present

## 2016-04-09 DIAGNOSIS — M1990 Unspecified osteoarthritis, unspecified site: Secondary | ICD-10-CM | POA: Diagnosis present

## 2016-04-09 DIAGNOSIS — G9341 Metabolic encephalopathy: Secondary | ICD-10-CM | POA: Diagnosis present

## 2016-04-09 DIAGNOSIS — R443 Hallucinations, unspecified: Secondary | ICD-10-CM | POA: Diagnosis present

## 2016-04-09 DIAGNOSIS — E785 Hyperlipidemia, unspecified: Secondary | ICD-10-CM | POA: Diagnosis present

## 2016-04-09 DIAGNOSIS — K659 Peritonitis, unspecified: Secondary | ICD-10-CM | POA: Diagnosis present

## 2016-04-09 DIAGNOSIS — H9190 Unspecified hearing loss, unspecified ear: Secondary | ICD-10-CM | POA: Diagnosis present

## 2016-04-09 DIAGNOSIS — R4182 Altered mental status, unspecified: Secondary | ICD-10-CM

## 2016-04-09 DIAGNOSIS — Z7984 Long term (current) use of oral hypoglycemic drugs: Secondary | ICD-10-CM

## 2016-04-09 DIAGNOSIS — Z9049 Acquired absence of other specified parts of digestive tract: Secondary | ICD-10-CM

## 2016-04-09 DIAGNOSIS — I517 Cardiomegaly: Secondary | ICD-10-CM | POA: Diagnosis not present

## 2016-04-09 DIAGNOSIS — Z886 Allergy status to analgesic agent status: Secondary | ICD-10-CM

## 2016-04-09 DIAGNOSIS — I5042 Chronic combined systolic (congestive) and diastolic (congestive) heart failure: Secondary | ICD-10-CM | POA: Diagnosis present

## 2016-04-09 DIAGNOSIS — Z923 Personal history of irradiation: Secondary | ICD-10-CM

## 2016-04-09 DIAGNOSIS — J90 Pleural effusion, not elsewhere classified: Secondary | ICD-10-CM

## 2016-04-09 DIAGNOSIS — G4730 Sleep apnea, unspecified: Secondary | ICD-10-CM | POA: Diagnosis present

## 2016-04-09 DIAGNOSIS — R1084 Generalized abdominal pain: Secondary | ICD-10-CM

## 2016-04-09 DIAGNOSIS — E118 Type 2 diabetes mellitus with unspecified complications: Secondary | ICD-10-CM | POA: Diagnosis present

## 2016-04-09 DIAGNOSIS — I77810 Thoracic aortic ectasia: Secondary | ICD-10-CM | POA: Diagnosis present

## 2016-04-09 DIAGNOSIS — E1122 Type 2 diabetes mellitus with diabetic chronic kidney disease: Secondary | ICD-10-CM | POA: Diagnosis present

## 2016-04-09 DIAGNOSIS — D638 Anemia in other chronic diseases classified elsewhere: Secondary | ICD-10-CM | POA: Diagnosis not present

## 2016-04-09 DIAGNOSIS — E876 Hypokalemia: Secondary | ICD-10-CM | POA: Diagnosis present

## 2016-04-09 DIAGNOSIS — Y828 Other medical devices associated with adverse incidents: Secondary | ICD-10-CM | POA: Diagnosis present

## 2016-04-09 DIAGNOSIS — Z7982 Long term (current) use of aspirin: Secondary | ICD-10-CM

## 2016-04-09 DIAGNOSIS — I351 Nonrheumatic aortic (valve) insufficiency: Secondary | ICD-10-CM | POA: Diagnosis present

## 2016-04-09 DIAGNOSIS — Z79899 Other long term (current) drug therapy: Secondary | ICD-10-CM

## 2016-04-09 DIAGNOSIS — I469 Cardiac arrest, cause unspecified: Secondary | ICD-10-CM | POA: Diagnosis not present

## 2016-04-09 DIAGNOSIS — M797 Fibromyalgia: Secondary | ICD-10-CM | POA: Diagnosis present

## 2016-04-09 DIAGNOSIS — Z992 Dependence on renal dialysis: Secondary | ICD-10-CM

## 2016-04-09 DIAGNOSIS — Z515 Encounter for palliative care: Secondary | ICD-10-CM

## 2016-04-09 DIAGNOSIS — I350 Nonrheumatic aortic (valve) stenosis: Secondary | ICD-10-CM | POA: Diagnosis present

## 2016-04-09 DIAGNOSIS — I132 Hypertensive heart and chronic kidney disease with heart failure and with stage 5 chronic kidney disease, or end stage renal disease: Secondary | ICD-10-CM | POA: Diagnosis present

## 2016-04-09 DIAGNOSIS — N2581 Secondary hyperparathyroidism of renal origin: Secondary | ICD-10-CM | POA: Diagnosis present

## 2016-04-09 DIAGNOSIS — I2510 Atherosclerotic heart disease of native coronary artery without angina pectoris: Secondary | ICD-10-CM | POA: Diagnosis present

## 2016-04-09 DIAGNOSIS — N186 End stage renal disease: Secondary | ICD-10-CM | POA: Diagnosis present

## 2016-04-09 DIAGNOSIS — Z86010 Personal history of colonic polyps: Secondary | ICD-10-CM

## 2016-04-09 DIAGNOSIS — I429 Cardiomyopathy, unspecified: Secondary | ICD-10-CM | POA: Diagnosis present

## 2016-04-09 DIAGNOSIS — D72829 Elevated white blood cell count, unspecified: Secondary | ICD-10-CM | POA: Diagnosis present

## 2016-04-09 DIAGNOSIS — R100 Acute abdomen: Secondary | ICD-10-CM | POA: Diagnosis not present

## 2016-04-09 DIAGNOSIS — Z9861 Coronary angioplasty status: Secondary | ICD-10-CM

## 2016-04-09 DIAGNOSIS — R109 Unspecified abdominal pain: Secondary | ICD-10-CM | POA: Diagnosis present

## 2016-04-09 DIAGNOSIS — Z87891 Personal history of nicotine dependence: Secondary | ICD-10-CM

## 2016-04-09 DIAGNOSIS — A419 Sepsis, unspecified organism: Secondary | ICD-10-CM | POA: Insufficient documentation

## 2016-04-09 DIAGNOSIS — I10 Essential (primary) hypertension: Secondary | ICD-10-CM | POA: Diagnosis present

## 2016-04-09 DIAGNOSIS — J189 Pneumonia, unspecified organism: Secondary | ICD-10-CM | POA: Diagnosis not present

## 2016-04-09 DIAGNOSIS — I6521 Occlusion and stenosis of right carotid artery: Secondary | ICD-10-CM | POA: Diagnosis present

## 2016-04-09 DIAGNOSIS — I6529 Occlusion and stenosis of unspecified carotid artery: Secondary | ICD-10-CM | POA: Diagnosis present

## 2016-04-09 DIAGNOSIS — Z880 Allergy status to penicillin: Secondary | ICD-10-CM

## 2016-04-09 DIAGNOSIS — R1030 Lower abdominal pain, unspecified: Secondary | ICD-10-CM | POA: Diagnosis not present

## 2016-04-09 DIAGNOSIS — E7800 Pure hypercholesterolemia, unspecified: Secondary | ICD-10-CM | POA: Diagnosis present

## 2016-04-09 DIAGNOSIS — K5900 Constipation, unspecified: Secondary | ICD-10-CM | POA: Diagnosis present

## 2016-04-09 DIAGNOSIS — R404 Transient alteration of awareness: Secondary | ICD-10-CM | POA: Diagnosis not present

## 2016-04-09 DIAGNOSIS — Z452 Encounter for adjustment and management of vascular access device: Secondary | ICD-10-CM

## 2016-04-09 DIAGNOSIS — T8571XA Infection and inflammatory reaction due to peritoneal dialysis catheter, initial encounter: Principal | ICD-10-CM | POA: Diagnosis present

## 2016-04-09 DIAGNOSIS — Z881 Allergy status to other antibiotic agents status: Secondary | ICD-10-CM

## 2016-04-09 DIAGNOSIS — Z888 Allergy status to other drugs, medicaments and biological substances status: Secondary | ICD-10-CM

## 2016-04-09 DIAGNOSIS — R1010 Upper abdominal pain, unspecified: Secondary | ICD-10-CM | POA: Diagnosis not present

## 2016-04-09 DIAGNOSIS — R634 Abnormal weight loss: Secondary | ICD-10-CM | POA: Diagnosis present

## 2016-04-09 DIAGNOSIS — R1013 Epigastric pain: Secondary | ICD-10-CM | POA: Diagnosis not present

## 2016-04-09 DIAGNOSIS — Z8521 Personal history of malignant neoplasm of larynx: Secondary | ICD-10-CM

## 2016-04-09 DIAGNOSIS — H269 Unspecified cataract: Secondary | ICD-10-CM | POA: Diagnosis present

## 2016-04-09 HISTORY — DX: Dependence on renal dialysis: N18.6

## 2016-04-09 HISTORY — DX: Dependence on renal dialysis: Z99.2

## 2016-04-09 HISTORY — DX: Low back pain, unspecified: M54.50

## 2016-04-09 HISTORY — DX: Low back pain: M54.5

## 2016-04-09 HISTORY — DX: Other chronic pain: G89.29

## 2016-04-09 HISTORY — DX: Personal history of other medical treatment: Z92.89

## 2016-04-09 LAB — I-STAT CG4 LACTIC ACID, ED
Lactic Acid, Venous: 3.66 mmol/L (ref 0.5–1.9)
Lactic Acid, Venous: 1.41 mmol/L (ref 0.5–1.9)

## 2016-04-09 LAB — COMPREHENSIVE METABOLIC PANEL WITH GFR
ALT: 33 U/L (ref 17–63)
AST: 62 U/L — ABNORMAL HIGH (ref 15–41)
Albumin: 1.7 g/dL — ABNORMAL LOW (ref 3.5–5.0)
Alkaline Phosphatase: 77 U/L (ref 38–126)
Anion gap: 16 — ABNORMAL HIGH (ref 5–15)
BUN: 35 mg/dL — ABNORMAL HIGH (ref 6–20)
CO2: 23 mmol/L (ref 22–32)
Calcium: 8.9 mg/dL (ref 8.9–10.3)
Chloride: 93 mmol/L — ABNORMAL LOW (ref 101–111)
Creatinine, Ser: 9.5 mg/dL — ABNORMAL HIGH (ref 0.61–1.24)
GFR calc Af Amer: 6 mL/min — ABNORMAL LOW (ref >60)
GFR calc non Af Amer: 5 mL/min — ABNORMAL LOW (ref >60)
Glucose, Bld: 71 mg/dL (ref 65–99)
Potassium: 2.7 mmol/L — CL (ref 3.5–5.1)
Sodium: 132 mmol/L — ABNORMAL LOW (ref 135–145)
Total Bilirubin: 0.4 mg/dL (ref 0.3–1.2)
Total Protein: 5.1 g/dL — ABNORMAL LOW (ref 6.5–8.1)

## 2016-04-09 LAB — GLUCOSE, CAPILLARY: Glucose-Capillary: 71 mg/dL (ref 65–99)

## 2016-04-09 LAB — CBC WITH DIFFERENTIAL/PLATELET
Basophils Absolute: 0.1 K/uL (ref 0.0–0.1)
Basophils Relative: 1 %
Eosinophils Absolute: 1.5 K/uL — ABNORMAL HIGH (ref 0.0–0.7)
Eosinophils Relative: 14 %
HCT: 27.8 % — ABNORMAL LOW (ref 39.0–52.0)
Hemoglobin: 8.5 g/dL — ABNORMAL LOW (ref 13.0–17.0)
Lymphocytes Relative: 7 %
Lymphs Abs: 0.7 K/uL (ref 0.7–4.0)
MCH: 32.1 pg (ref 26.0–34.0)
MCHC: 30.6 g/dL (ref 30.0–36.0)
MCV: 104.9 fL — ABNORMAL HIGH (ref 78.0–100.0)
Monocytes Absolute: 0.5 K/uL (ref 0.1–1.0)
Monocytes Relative: 5 %
Neutro Abs: 7.6 K/uL (ref 1.7–7.7)
Neutrophils Relative %: 73 %
Platelets: 236 K/uL (ref 150–400)
RBC: 2.65 MIL/uL — ABNORMAL LOW (ref 4.22–5.81)
RDW: 17.5 % — ABNORMAL HIGH (ref 11.5–15.5)
WBC: 10.4 K/uL (ref 4.0–10.5)

## 2016-04-09 LAB — LIPASE, BLOOD: Lipase: 22 U/L (ref 11–51)

## 2016-04-09 LAB — PROTIME-INR
INR: 1.34
Prothrombin Time: 16.7 seconds — ABNORMAL HIGH (ref 11.4–15.2)

## 2016-04-09 LAB — LACTIC ACID, PLASMA: LACTIC ACID, VENOUS: 1.4 mmol/L (ref 0.5–1.9)

## 2016-04-09 LAB — C DIFFICILE QUICK SCREEN W PCR REFLEX
C DIFFICILE (CDIFF) INTERP: NOT DETECTED
C DIFFICILE (CDIFF) TOXIN: NEGATIVE
C Diff antigen: NEGATIVE

## 2016-04-09 LAB — APTT: aPTT: 39 seconds — ABNORMAL HIGH (ref 24–36)

## 2016-04-09 LAB — MAGNESIUM: Magnesium: 1.6 mg/dL — ABNORMAL LOW (ref 1.7–2.4)

## 2016-04-09 MED ORDER — ORAL CARE MOUTH RINSE
15.0000 mL | Freq: Two times a day (BID) | OROMUCOSAL | Status: DC
  Administered 2016-04-11 – 2016-04-19 (×13): 15 mL via OROMUCOSAL

## 2016-04-09 MED ORDER — SODIUM CHLORIDE 0.9 % IV BOLUS (SEPSIS)
1000.0000 mL | Freq: Once | INTRAVENOUS | Status: AC
  Administered 2016-04-09: 1000 mL via INTRAVENOUS

## 2016-04-09 MED ORDER — SODIUM CHLORIDE 0.9% FLUSH
3.0000 mL | Freq: Two times a day (BID) | INTRAVENOUS | Status: DC
  Administered 2016-04-11 – 2016-04-19 (×14): 3 mL via INTRAVENOUS

## 2016-04-09 MED ORDER — SODIUM CHLORIDE 0.9 % IV SOLN
INTRAVENOUS | Status: DC
  Administered 2016-04-09: 23:00:00 via INTRAVENOUS

## 2016-04-09 MED ORDER — FAMOTIDINE IN NACL 20-0.9 MG/50ML-% IV SOLN
20.0000 mg | Freq: Two times a day (BID) | INTRAVENOUS | Status: DC
  Administered 2016-04-09 – 2016-04-12 (×4): 20 mg via INTRAVENOUS
  Filled 2016-04-09 (×6): qty 50

## 2016-04-09 MED ORDER — ASPIRIN 300 MG RE SUPP
150.0000 mg | Freq: Every day | RECTAL | Status: DC
  Administered 2016-04-10: 150 mg via RECTAL
  Filled 2016-04-09 (×2): qty 1

## 2016-04-09 MED ORDER — POTASSIUM CHLORIDE 10 MEQ/100ML IV SOLN
10.0000 meq | INTRAVENOUS | Status: AC
  Administered 2016-04-09: 10 meq via INTRAVENOUS
  Filled 2016-04-09: qty 100

## 2016-04-09 MED ORDER — PIPERACILLIN-TAZOBACTAM 3.375 G IVPB 30 MIN
3.3750 g | Freq: Once | INTRAVENOUS | Status: AC
  Administered 2016-04-09: 3.375 g via INTRAVENOUS
  Filled 2016-04-09: qty 50

## 2016-04-09 MED ORDER — PIPERACILLIN-TAZOBACTAM IN DEX 2-0.25 GM/50ML IV SOLN
2.2500 g | Freq: Four times a day (QID) | INTRAVENOUS | Status: DC
  Administered 2016-04-10 (×4): 2.25 g via INTRAVENOUS
  Filled 2016-04-09 (×8): qty 50

## 2016-04-09 MED ORDER — METOPROLOL TARTRATE 5 MG/5ML IV SOLN
5.0000 mg | Freq: Three times a day (TID) | INTRAVENOUS | Status: DC
  Administered 2016-04-09 – 2016-04-10 (×2): 5 mg via INTRAVENOUS
  Filled 2016-04-09 (×2): qty 5

## 2016-04-09 MED ORDER — INSULIN ASPART 100 UNIT/ML ~~LOC~~ SOLN
0.0000 [IU] | Freq: Three times a day (TID) | SUBCUTANEOUS | Status: DC
  Administered 2016-04-12: 1 [IU] via SUBCUTANEOUS

## 2016-04-09 MED ORDER — ONDANSETRON HCL 4 MG/2ML IJ SOLN
4.0000 mg | Freq: Three times a day (TID) | INTRAMUSCULAR | Status: DC | PRN
  Administered 2016-04-10 – 2016-04-18 (×3): 4 mg via INTRAVENOUS
  Filled 2016-04-09 (×3): qty 2

## 2016-04-09 MED ORDER — VANCOMYCIN HCL 10 G IV SOLR
1500.0000 mg | Freq: Once | INTRAVENOUS | Status: AC
  Administered 2016-04-09: 1500 mg via INTRAVENOUS
  Filled 2016-04-09: qty 1500

## 2016-04-09 MED ORDER — HYDRALAZINE HCL 20 MG/ML IJ SOLN: 5.0000 mg | INTRAMUSCULAR | Status: DC | PRN

## 2016-04-09 MED ORDER — TRIAMCINOLONE ACETONIDE 0.1 % EX CREA: 1.0000 "application " | TOPICAL_CREAM | Freq: Two times a day (BID) | CUTANEOUS | Status: DC | PRN

## 2016-04-09 MED ORDER — INSULIN ASPART 100 UNIT/ML ~~LOC~~ SOLN: 0.0000 [IU] | Freq: Every day | SUBCUTANEOUS | Status: DC

## 2016-04-09 MED ORDER — IPRATROPIUM BROMIDE 0.06 % NA SOLN
2.0000 | Freq: Two times a day (BID) | NASAL | Status: DC
  Administered 2016-04-09 – 2016-04-19 (×13): 2 via NASAL
  Filled 2016-04-09 (×2): qty 15

## 2016-04-09 MED ORDER — MORPHINE SULFATE (PF) 2 MG/ML IV SOLN
2.0000 mg | INTRAVENOUS | Status: DC | PRN
  Administered 2016-04-09 – 2016-04-12 (×5): 2 mg via INTRAVENOUS
  Filled 2016-04-09 (×6): qty 1

## 2016-04-09 MED ORDER — FLUCONAZOLE IN SODIUM CHLORIDE 200-0.9 MG/100ML-% IV SOLN
200.0000 mg | INTRAVENOUS | Status: DC
  Administered 2016-04-09 – 2016-04-15 (×7): 200 mg via INTRAVENOUS
  Filled 2016-04-09 (×9): qty 100

## 2016-04-09 MED ORDER — SODIUM CHLORIDE 0.9 % IV BOLUS (SEPSIS)
500.0000 mL | Freq: Once | INTRAVENOUS | Status: AC
  Administered 2016-04-09: 500 mL via INTRAVENOUS

## 2016-04-09 MED ORDER — MUPIROCIN 2 % EX OINT: 1.0000 "application " | TOPICAL_OINTMENT | Freq: Two times a day (BID) | CUTANEOUS | Status: DC | PRN

## 2016-04-09 MED ORDER — POTASSIUM CHLORIDE 10 MEQ/100ML IV SOLN
10.0000 meq | INTRAVENOUS | Status: AC
  Administered 2016-04-09 – 2016-04-10 (×5): 10 meq via INTRAVENOUS
  Filled 2016-04-09 (×5): qty 100

## 2016-04-09 NOTE — Progress Notes (Signed)
Pharmacy Antibiotic Note  Victor Lane is a 70 y.o. male admitted on 04/18/2016 with intra-abdominal infection.  Pharmacy has been consulted for zosyn dosing. CC peritonitis. Treated and discharged 10/11 at [facility]. ESRD w/ CAPD  Plan: Zosyn 3.375 g IV 30 min infusion x 1 then 2.25 g IV q6h Vancomycin 1500 mg IV x1 Monitor clinical course  Weight: 172 lb (78 kg)  Temp (24hrs), Avg:97.8 F (36.6 C), Min:97.8 F (36.6 C), Max:97.8 F (36.6 C)   Recent Labs Lab 04/04/16 0425 04/04/16 0426  WBC 13.9*  --   CREATININE  --  10.52*  VANCORANDOM 23  --     Estimated Creatinine Clearance: 6.7 mL/min (by C-G formula based on SCr of 10.52 mg/dL (H)).   ESRD  Allergies  Allergen Reactions  . Fentanyl Other (See Comments)    Confusion when patch applied  . Nsaids Other (See Comments)    Severe kidney disease/ESRD  . Amoxicillin Diarrhea and Nausea And Vomiting  . Moxifloxacin Diarrhea and Nausea And Vomiting    Antimicrobials this admission:  Zosyn 10/19 >. Vanc 10/19 >>  Dose adjustments this admission:  N/A  Microbiology results:  10/19 BCx: Sent 10/19 UCx: Sent Thank you for allowing pharmacy to be a part of this patient's care.  Cheral Almas, PharmD Candidate 04/06/2016 4:36 PM

## 2016-04-09 NOTE — ED Triage Notes (Addendum)
Pt sent from [facility] for peritonitis. Pt altered in triage and diaphoretic. Wife at bedside and states he has been declining in mentation throughout the day worse now.

## 2016-04-09 NOTE — Progress Notes (Signed)
Adding fluconazole to other antimicrobials Pt is ESRD on CAPD Fluconazole 200 mg IV q24h  Harvel Quale  03/30/2016 10:35 PM

## 2016-04-09 NOTE — ED Notes (Signed)
Nurse drawing labs. 

## 2016-04-09 NOTE — ED Notes (Signed)
Per Dr. Marlou Sa do not start abx until peritoneal cultures collected.

## 2016-04-09 NOTE — ED Notes (Signed)
Critical Lab 2.7 Potassium Dr. Marlou Sa made aware.

## 2016-04-09 NOTE — Progress Notes (Signed)
  Subjective: Not able to obtain. Pt confused  Recent consult from over the weekend. pls see consult note. Briefly 70 yo WM with multiple medical problems with ESRD on PD since PD catheter insertion in 2015 by Dr Johney Maine presented back to ED at request of renal MD for PD cath removal for refractory culture negative peritonitis.   No family at [facility].   Objective: Vital signs in last 24 hours: Temp:  [97.8 F (36.6 C)] 97.8 F (36.6 C) (10/19 1548) Pulse Rate:  [95-115] 95 (10/19 1626) Resp:  [12-25] 19 (10/19 2000) BP: (74-132)/(38-69) 113/57 (10/19 2000) SpO2:  [90 %-100 %] 94 % (10/19 2000) Weight:  [78 kg (172 lb)] 78 kg (172 lb) (10/19 1624)    Intake/Output from previous day: No intake/output data recorded. Intake/Output this shift: No intake/output data recorded.  Confused. Drowsy cta b/l Reg; +murmur Soft, mild distended. Nt. +PD catheter - c/d/i No edema  Lab Results:   Recent Labs  03/29/2016 1623  WBC 10.4  HGB 8.5*  HCT 27.8*  PLT 236   BMET  Recent Labs  04/01/2016 1623  NA 132*  K 2.7*  CL 93*  CO2 23  GLUCOSE 71  BUN 35*  CREATININE 9.50*  CALCIUM 8.9   PT/INR No results for input(s): LABPROT, INR in the last 72 hours. ABG No results for input(s): PHART, HCO3 in the last 72 hours.  Invalid input(s): PCO2, PO2  Studies/Results: Dg Chest Port 1 View  Result Date: 04/10/2016 CLINICAL DATA:  Sepsis.  History of head and neck cancer. EXAM: PORTABLE CHEST 1 VIEW COMPARISON:  Chest radiograph April 01, 2016 FINDINGS: Cardiac silhouette is mildly enlarged unchanged. Heavily calcified aortic. Diffuse interstitial prominence. Worsening retrocardiac consolidation and small LEFT pleural effusion. RIGHT lung base strandy densities. No pneumothorax. Surgical clips LEFT neck, stent projects in RIGHT neck and LEFT subclavian vessels. Postsurgical changes RIGHT distal clavicle. Bibasilar strandy densities IMPRESSION: Stable cardiomegaly. Worsening retrocardiac  consolidation with small LEFT pleural effusion. RIGHT lung base atelectasis/ scarring. Worsening bronchitic changes. Electronically Signed   By: Elon Alas M.D.   On: 04/03/2016 19:40    Anti-infectives: Anti-infectives    Start     Dose/Rate Route Frequency Ordered Stop   04/13/2016 2200  piperacillin-tazobactam (ZOSYN) IVPB 2.25 g     2.25 g 100 mL/hr over 30 Minutes Intravenous Every 6 hours 03/25/2016 1622     04/06/2016 1615  piperacillin-tazobactam (ZOSYN) IVPB 3.375 g     3.375 g 100 mL/hr over 30 Minutes Intravenous  Once 04/10/2016 1609 04/08/2016 1756   03/26/2016 1615  vancomycin (VANCOCIN) 1,500 mg in sodium chloride 0.9 % 500 mL IVPB     1,500 mg 250 mL/hr over 120 Minutes Intravenous  Once 03/25/2016 1609 03/28/2016 1931      Assessment/Plan: Refractory peritonitis ESRD CAD CHF Hypokalemia  No family at [facility] Will tentatively plan PD catheter removal Friday with Dr Alphonsa Overall [facility] mgmt per renal/primary team NPO except meds after midnight Repeat labs in AM  [facility]. Redmond Pulling, MD, FACS General, Bariatric, & Minimally Invasive Surgery [facility], [place]   LOS: 0 days    Gayland Curry 04/02/2016

## 2016-04-09 NOTE — ED Provider Notes (Signed)
[facility] DEPT Provider Note   CSN: [id number] Arrival date & time: 03/31/2016  1540     History   Chief Complaint Chief Complaint  Patient presents with  . Abnormal Lab    HPI Victor Lane is a 70 y.o. male.   Altered Mental Status   This is a new problem. The current episode started 2 days ago. The problem has been gradually worsening. Associated symptoms include confusion, seizures and weakness. Risk factors include a recent infection (perititis 2/2 dialysis). His past medical history is significant for hypertension and heart disease. His past medical history does not include head trauma.    Past Medical History:  Diagnosis Date  . Aortic regurgitation   . Aortic valve sclerosis   . Arthritis    "just age related" (08/03/2014)  . Carotid artery occlusion    s/p left CEA with 40-59% restenosis and >80% stenosis of the Right ICA - followed by Dr. Kellie Simmering  . Cataracts, bilateral   . Chronic combined systolic and diastolic CHF (congestive heart failure) ([place])   . Complication of anesthesia    hard to wake up and then panics waking up  . Coronary artery disease    severe CAD with mid RCA occlusion, distal LAD stenosis, moderate disease of the left circ. Repeat cath 07/2014 for worsening LVF showed severe disease of the mid LAD, mild to mod LCX disease, occluded PL of the RCA with left to right collaterals to distal RCA territory s/p PCI of the LAD with DES.  It was noted that if patient develops CP resistant to medical therapy could consider PCI of RCA  . Diastolic dysfunction   . Dilated aortic root ([place])   . ESRD on peritoneal dialysis ([place]) 09/2013   "followed by [name]; [facility]" (08/03/2014)  . Fibromyalgia    takes tramadol daily as needed  . GERD (gastroesophageal reflux disease)   . H/O vitamin D deficiency   . Hearing loss   . History of colon polyps   . Hypercholesteremia   . Hypertension   . Myalgia and myositis   . Neuropathy ([place])   .  Peripheral neuropathy ([place])   . S/P radiation therapy 01/24/2013-03/14/2013   Larynx/glottis / 63 Gy in 28 fractions  . Sleep apnea    to request from Dr.Fried was done about 62yrs ago but no cpap d/t weight loss  . Type II diabetes mellitus ([place])    "diet controlled now" (08/03/2014)  . 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 Peritonitis, dialysis-associated, subsequent encounter ([place]) 04/02/2016  . Abdominal pain 04/01/2016  . Mesenteric ischemia ([place])   . Diabetes mellitus with complication ([place])   . Carotid stenosis 08/21/2014  . Chronic systolic congestive heart failure ([place])   . Dyslipidemia 07/30/2014  . Aortic regurgitation   . CAD (coronary artery disease), native coronary artery 11/20/2013  . Cardiomyopathy, ischemic 11/20/2013  . Dilated aortic root ([place])   . Chronic respiratory failure ([place]) 08/29/2013  . ESRD on dialysis ([place]) 08/29/2013  . Paroxysmal atrial flutter ([place]) 08/10/2013  . Thrombocytopenia ([place]) 08/10/2013  . Leukopenia 08/10/2013  . Continuous ambulatory peritoneal dialysis status ([place]) 08/07/2013  . Severe sepsis(995.92) 08/06/2013  . Peritonitis ([place]) 08/06/2013  . HCAP (healthcare-associated pneumonia) 08/06/2013  . Diastasis recti 07/11/2013  . COPD GOLD II 06/29/2013  . Hepatitis 05/22/2013  . Leg weakness, bilateral  04/19/2013  . Pain in limb 04/19/2013  . Neuropathy ([place]) 04/19/2013  . Lumbago 04/19/2013  . Other malaise and fatigue 04/19/2013  . Dysphagia 02/16/2013  . Protein-calorie malnutrition, severe ([place]) 02/12/2013  . Diabetes mellitus ([place]) 02/10/2013  . HTN (hypertension) 02/10/2013  . ESRD (end stage renal disease) on dialysis ([place]) 02/10/2013  . Anemia of chronic disease 02/10/2013  . Transaminitis 02/10/2013  . 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 Carotid artery stenosis 03/24/2011    Past Surgical History:   Procedure Laterality Date  . AV FISTULA PLACEMENT Left 02/23/2013   Procedure: ARTERIOVENOUS (AV) FISTULA CREATION;  Surgeon: Conrad Candelaria, MD;  Location: [facility];  Service: Vascular;  Laterality: Left;  . CAPD INSERTION N/A 08/01/2013   Procedure: LAPAROSCOPIC INSERTION CONTINUOUS AMBULATORY PERITONEAL DIALYSIS CATHETER;  Surgeon: [name], MD;  Location: [place] [facility];  Service: General;  Laterality: N/A;  . CARDIAC CATHETERIZATION  07/2013  . CAROTID ENDARTERECTOMY Left   . CAROTID STENT INSERTION N/A 08/21/2014   Procedure: CAROTID STENT INSERTION;  Surgeon: [name], MD;  Location: [facility] CATH LAB;  Service: Cardiovascular;  Laterality: N/A;  . CHOLECYSTECTOMY  1993  . COLONOSCOPY W/ BIOPSIES AND POLYPECTOMY     Hx: of  . CORONARY ANGIOPLASTY  08/03/2014  . LAPAROSCOPIC ABDOMINAL EXPLORATION N/A 08/01/2013   Procedure: LAPAROSCOPIC ABDOMINAL EXPLORATION;  Surgeon: [name], MD;  Location: [facility];  Service: General;  Laterality: N/A;  . LEFT AND RIGHT HEART CATHETERIZATION WITH CORONARY ANGIOGRAM N/A 08/03/2014   Procedure: LEFT AND RIGHT HEART CATHETERIZATION WITH CORONARY ANGIOGRAM;  Surgeon: [name], MD;  Location: [facility] CATH LAB;  Service: Cardiovascular;  Laterality: N/A;  . LEFT HEART CATHETERIZATION WITH CORONARY ANGIOGRAM N/A 08/14/2013   Procedure: LEFT HEART CATHETERIZATION WITH CORONARY ANGIOGRAM;  Surgeon: [name], MD;  Location: [facility] CATH LAB;  Service: Cardiovascular;  Laterality: N/A;  . MICROLARYNGOSCOPY WITH LASER N/A 01/04/2013   Procedure: MICROLARYNGOSCOPY WITH BIOPSY/LASER;  Surgeon: [name], MD;  Location: [facility];  Service: ENT;  Laterality: N/A;  . SHOULDER OPEN ROTATOR CUFF REPAIR Right 07/06/2011  . TRACHEOSTOMY  01/2013  . TRACHEOSTOMY CLOSURE  02/2013  . ULNAR NERVE TRANSPOSITION Left 02/22/2014   Procedure: LEFT ULNAR NERVE RELEASE AND OR TRANSPOSITION;  Surgeon: [name], MD;  Location: [facility];  Service: Orthopedics;  Laterality: Left;        Home Medications    Prior to Admission medications   Medication Sig Start Date End Date Taking? Authorizing Provider  acetaminophen (TYLENOL) 500 MG tablet Take 1,000 mg by mouth every 6 (six) hours as needed for mild pain or headache.     Historical Provider, MD  ALPRAZolam Duanne Moron) 0.5 MG tablet Take 0.5 mg by mouth at bedtime.  02/05/16   Historical Provider, MD  aspirin 325 MG EC tablet Take 325 mg by mouth daily. 01/31/16   Historical Provider, MD  calcium acetate (PHOSLO) 667 MG capsule Take 667-1,334 mg by mouth See admin instructions. Take 2 capsules (1334 mg) every morning and at lunch. Depending on the evening meal, takes 667 mg (1 tablet) or 1334 mg (2 tablets)    Historical Provider, MD  chlorhexidine (PERIDEX) 0.12 % solution Use as directed 15 mLs in the mouth or throat at bedtime.  02/26/16   Historical Provider, MD  DOCOSAHEXAENOIC ACID PO Take 2 g by mouth every morning.    Historical Provider, MD  gabapentin (NEURONTIN) 100 MG capsule Take 100 mg by mouth 2 (two) times  daily.    Historical Provider, MD  gentamicin cream (GARAMYCIN) 0.1 % Apply 1 application topically daily. 03/10/16   Historical Provider, MD  glipiZIDE (GLUCOTROL XL) 2.5 MG 24 hr tablet Take 2.5 mg by mouth daily as needed (for high blood sugar).  01/27/16   Historical Provider, MD  ipratropium (ATROVENT) 0.06 % nasal spray Place 2 sprays into both nostrils 2 (two) times daily.     Historical Provider, MD  lisinopril (PRINIVIL,ZESTRIL) 20 MG tablet Take 20 mg by mouth at bedtime. 03/17/16   Historical Provider, MD  metoprolol succinate (TOPROL-XL) 50 MG 24 hr tablet Take 50 mg by mouth 2 (two) times daily. Take with or immediately following a meal.    Historical Provider, MD  multivitamin (RENA-VIT) TABS tablet Take 1 tablet by mouth daily.    Historical Provider, MD  mupirocin ointment (BACTROBAN) 2 % Apply 1 application topically 2 (two) times daily as needed (rash).     Historical Provider, MD   nitroGLYCERIN (NITROSTAT) 0.4 MG SL tablet Place 1 tablet (0.4 mg total) under the tongue every 5 (five) minutes as needed for chest pain. 08/05/14   Brittainy Erie Noe, PA-C  omeprazole (PRILOSEC) 20 MG capsule Take 20 mg by mouth every morning. 03/04/16   Historical Provider, MD  oxyCODONE-acetaminophen (PERCOCET/ROXICET) 5-325 MG tablet Take 1 tablet by mouth every 6 (six) hours as needed for severe pain. 04/04/16   Orson Eva, MD  rosuvastatin (CRESTOR) 5 MG tablet Take 1 tablet by mouth as directed up to once daily Patient taking differently: Take 5 mg by mouth 3 (three) times a week. Monday, Wednesday, Friday at noon 08/10/14   Sueanne Margarita, MD  traMADol (ULTRAM) 50 MG tablet Take 100 mg by mouth every 4 (four) hours as needed (pain).    Historical Provider, MD  triamcinolone cream (KENALOG) 0.1 % Apply 1 application topically 2 (two) times daily as needed (rash).  08/07/14   Historical Provider, MD    Family History Family History  Problem Relation Age of Onset  . Adopted: Yes  . Family history unknown: Yes    Social History Social History  Substance Use Topics  . Smoking status: Former Smoker    Packs/day: 2.00    Years: 31.00    Types: Cigarettes    Quit date: 08/07/1995  . Smokeless tobacco: Never Used     Comment: quit smoking in 1997  . Alcohol use No     Allergies   Fentanyl; Nsaids; Amoxicillin; and Moxifloxacin   Review of Systems Review of Systems  Unable to perform ROS: Mental status change  Neurological: Positive for seizures and weakness.  Psychiatric/Behavioral: Positive for confusion.     Physical Exam Updated Vital Signs BP (!) 74/38 (BP Location: Right Arm)   Pulse 115   Temp 97.8 F (36.6 C) (Oral)   Resp 25   SpO2 90%   Physical Exam  Constitutional: He appears well-developed and well-nourished.  HENT:  Head: Normocephalic and atraumatic.  Eyes: Conjunctivae and EOM are normal. Pupils are equal, round, and reactive to light.  Neck:  Normal range of motion. Neck supple.  Cardiovascular: Intact distal pulses.   tachycardic  Pulmonary/Chest: Effort normal and breath sounds normal. No respiratory distress.  Abdominal: Soft. There is tenderness (generalized).  Musculoskeletal: He exhibits no edema.  Neurological: He is alert.  Oriented to self and place.  Skin: Skin is warm and dry.  Psychiatric: He has a normal mood and affect.  Nursing note and vitals reviewed.  ED Treatments / Results  Labs (all labs ordered are listed, but only abnormal results are displayed) Labs Reviewed  COMPREHENSIVE METABOLIC PANEL - Abnormal; Notable for the following:       Result Value   Sodium 132 (*)    Potassium 2.7 (*)    Chloride 93 (*)    BUN 35 (*)    Creatinine, Ser 9.50 (*)    Total Protein 5.1 (*)    Albumin 1.7 (*)    AST 62 (*)    GFR calc non Af Amer 5 (*)    GFR calc Af Amer 6 (*)    Anion gap 16 (*)    All other components within normal limits  CBC WITH DIFFERENTIAL/PLATELET - Abnormal; Notable for the following:    RBC 2.65 (*)    Hemoglobin 8.5 (*)    HCT 27.8 (*)    MCV 104.9 (*)    RDW 17.5 (*)    Eosinophils Absolute 1.5 (*)    All other components within normal limits  MAGNESIUM - Abnormal; Notable for the following:    Magnesium 1.6 (*)    All other components within normal limits  PROTIME-INR - Abnormal; Notable for the following:    Prothrombin Time 16.7 (*)    All other components within normal limits  APTT - Abnormal; Notable for the following:    aPTT 39 (*)    All other components within normal limits  COMPREHENSIVE METABOLIC PANEL - Abnormal; Notable for the following:    Sodium 132 (*)    CO2 18 (*)    Glucose, Bld 61 (*)    BUN 33 (*)    Creatinine, Ser 9.11 (*)    Calcium 7.8 (*)    Total Protein 4.8 (*)    Albumin 1.5 (*)    AST 63 (*)    GFR calc non Af Amer 5 (*)    GFR calc Af Amer 6 (*)    All other components within normal limits  CBC - Abnormal; Notable for the  following:    WBC 13.7 (*)    RBC 2.54 (*)    Hemoglobin 8.1 (*)    HCT 26.7 (*)    MCV 105.1 (*)    RDW 17.3 (*)    All other components within normal limits  I-STAT CG4 LACTIC ACID, ED - Abnormal; Notable for the following:    Lactic Acid, Venous 3.66 (*)    All other components within normal limits  CULTURE, BLOOD (ROUTINE X 2)  CULTURE, BLOOD (ROUTINE X 2)  C DIFFICILE QUICK SCREEN W PCR REFLEX  URINE CULTURE  GASTROINTESTINAL PANEL BY PCR, STOOL (REPLACES STOOL CULTURE)  GRAM STAIN  BODY FLUID CULTURE  MRSA PCR SCREENING  LIPASE, BLOOD  LACTIC ACID, PLASMA  LACTIC ACID, PLASMA  PROCALCITONIN  GLUCOSE, CAPILLARY  GLUCOSE, CAPILLARY  GLUCOSE, CAPILLARY  URINALYSIS, ROUTINE W REFLEX MICROSCOPIC (NOT AT [facility])  LACTATE DEHYDROGENASE, BODY FLUID  GLUCOSE, PERITONEAL FLUID  PROTEIN, BODY FLUID  ALBUMIN, FLUID  BODY FLUID CELL COUNT WITH DIFFERENTIAL  I-STAT CG4 LACTIC ACID, ED    EKG  EKG Interpretation None       Radiology Dg Chest Port 1 View  Result Date: 04/21/2016 CLINICAL DATA:  Sepsis.  History of head and neck cancer. EXAM: PORTABLE CHEST 1 VIEW COMPARISON:  Chest radiograph April 01, 2016 FINDINGS: Cardiac silhouette is mildly enlarged unchanged. Heavily calcified aortic. Diffuse interstitial prominence. Worsening retrocardiac consolidation and small LEFT pleural effusion. RIGHT lung base strandy  densities. No pneumothorax. Surgical clips LEFT neck, stent projects in RIGHT neck and LEFT subclavian vessels. Postsurgical changes RIGHT distal clavicle. Bibasilar strandy densities IMPRESSION: Stable cardiomegaly. Worsening retrocardiac consolidation with small LEFT pleural effusion. RIGHT lung base atelectasis/ scarring. Worsening bronchitic changes. Electronically Signed   By: Elon Alas M.D.   On: 03/22/2016 19:40    Procedures Procedures (including critical care time)  Medications Ordered in ED Medications  piperacillin-tazobactam (ZOSYN) IVPB  2.25 g ( Intravenous Automatically Held 04/25/16 2200)  potassium chloride 10 mEq in 100 mL IVPB (10 mEq Intravenous Not Given 03/22/2016 2130)  mupirocin ointment (BACTROBAN) 2 % 1 application ( Topical MAR Hold 04/06/2016 1044)  triamcinolone cream (KENALOG) 0.1 % 1 application ( Topical MAR Hold 04/02/2016 1044)  ipratropium (ATROVENT) 0.06 % nasal spray 2 spray ( Each Nare Automatically Held 04/25/16 2200)  ondansetron (ZOFRAN) injection 4 mg ( Intravenous MAR Hold 03/30/2016 1044)  famotidine (PEPCID) IVPB 20 mg premix ( Intravenous Automatically Held 04/25/16 2200)  aspirin suppository 150 mg ( Rectal Automatically Held 04/18/16 1000)  insulin aspart (novoLOG) injection 0-9 Units ( Subcutaneous Automatically Held 04/18/16 1700)  insulin aspart (novoLOG) injection 0-5 Units ( Subcutaneous Automatically Held 04/25/16 2200)  sodium chloride flush (NS) 0.9 % injection 3 mL ( Intravenous Automatically Held 04/25/16 2200)  morphine 2 MG/ML injection 2 mg ( Intravenous MAR Hold 03/28/2016 1044)  fluconazole (DIFLUCAN) IVPB 200 mg ( Intravenous Automatically Held 04/25/16 2300)  MEDLINE mouth rinse ( Mouth Rinse Automatically Held 04/18/16 2200)  sodium chloride 0.9 % bolus 1,000 mL (0 mLs Intravenous Stopped 04/08/2016 1735)    And  sodium chloride 0.9 % bolus 1,000 mL (0 mLs Intravenous Stopped 04/07/2016 1735)    And  sodium chloride 0.9 % bolus 500 mL (0 mLs Intravenous Stopped 04/04/2016 1743)  piperacillin-tazobactam (ZOSYN) IVPB 3.375 g (0 g Intravenous Stopped 03/23/2016 1756)  vancomycin (VANCOCIN) 1,500 mg in sodium chloride 0.9 % 500 mL IVPB (0 mg Intravenous Stopped 03/30/2016 2140)  potassium chloride 10 mEq in 100 mL IVPB (10 mEq Intravenous Given 04/21/2016 0325)     Initial Impression / Assessment and Plan / ED Course  I have reviewed the triage vital signs and the nursing notes.  Pertinent labs & imaging results that were available during my care of the patient were reviewed by me and considered in my  medical decision making (see chart for details).  Clinical Course   Mr. Syvertson is a 70 year old male with past medical history significant for peritoneal dialysis, CAD, valvular heart disease, diabetes.  He presents for altered mental status and likely peritonitis due to his catheter.  Patient was found to have soiled himself.  Stool pathogen panel sent.  On arrival patient is hypotensive, tachycardic, and hypoxic.  Sepsis protocol begun and the patient was bolused 30 cc/kg.  Vancomycin and Zosyn were started after obtaining cultures.  The patient's blood pressure responded well and he stabilized.  EKG obtained, demonstrates sinus tachycardia and diffuse ST-T changes.  Labs ordered including CBC, CMP, urine studies, lactic acid, lipase.  Results significant for lactic acidosis, elevated white count, hypokalemia, baseline creatinine, anemia.  Ordered peritoneal fluid analyis as well.  Patient's nephrologist Dr. Jonnie Finner was at bedside and in agreement with work up.  He states that peritoneal fluid analysis was already started.  Will not delay treatment to repeat.  Chest x-ray obtained, demonstrates personally reviewed by me, demonstrates worsening consolidation with left pleural effusion and atelectasis of the right lung base.  Patient is admitted  to the hospitalist for further evaluation and treatment.   Final Clinical Impressions(s) / ED Diagnoses   Final diagnoses:  Sepsis, due to unspecified organism [facility])    New Prescriptions New Prescriptions   No medications on file     Elveria Rising, MD 04/08/2016 1054    Tanna Furry, MD 2016/05/15 0025

## 2016-04-09 NOTE — ED Provider Notes (Signed)
Pt seen and evaluated.  D/W Dr. Marlou Sa.  Pt exmained.  Confused, tachycardic.  BP improved p fluids.  AP and TTP without rigidity.  Pt septic, suspect peritonitis.  D/W and pt seen by Dr. Jonnie Finner.  Given antibiotics, and ivf.  D/W triad Hospitalist.  Pt to be admitted to SDU.   Tanna Furry, MD 04/10/2016 2119

## 2016-04-09 NOTE — H&P (Signed)
History and Physical    Victor Lane [id number] DOB: 1946-06-18 DOA: 04/05/2016  Referring MD/NP/PA:   PCP: [name], MD   Patient coming from:  The patient is coming from home.  At baseline, pt is independent for most of ADL.  Chief Complaint: abdominal pain and AMS  HPI: Victor Lane is a 70 y.o. 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  Patient was recently hospitalized from a 10/11 to 10/14 due to peritonitis. Per report, pt has worsening abdominal pain, wt loss, marked weakness. His ell count in PD fluid from yesterday remains very high 5,285.  His renal physician told him to go to ED and has d/w surgeon's office about removal of PD cath. Since he is confused, could not characterize his abdominal pain in detail. When I saw pt in ED, he complains of abdominal pain, denies chest pain. No active nausea, cough. Per RN, pt has loose stool, but C diff pcr negative in ED. He moves all extremities.  ED Course: pt was found to have hypotension with blood pressure 70/38 which improved to 105/95 after 2.5 L normal saline bolus, WBC 10.4, lactate is 3.66, potassium 2.7, creatinine 9.50, BUN 35, bicarbonate 23, pending urinalysis, temperature normal, tachycardia, tachypnea. Chest x-ray showed stable cardiomegaly. Worsening retrocardiac consolidation with small LEFT pleural effusion. Pt is admitted to SDU bed as inpt. Nephrology, Dr. Jonnie Finner was consulted.   Review of Systems: Could not be reviewed accurately due to altered mental status.  Allergy:  Allergies  Allergen Reactions  . Fentanyl Other (See Comments)    Confusion when patch applied  . Nsaids Other (See Comments)    Severe kidney  disease/ESRD  . Amoxicillin Diarrhea and Nausea And Vomiting  . Moxifloxacin Diarrhea and Nausea And Vomiting    Past Medical History:  Diagnosis Date  . Aortic regurgitation   . Aortic valve sclerosis   . Arthritis    "just age related" (08/03/2014)  . Carotid artery occlusion    s/p left CEA with 40-59% restenosis and >80% stenosis of the Right ICA - followed by Dr. Kellie Simmering  . Cataracts, bilateral   . Chronic combined systolic and diastolic CHF (congestive heart failure) ([place])   . Complication of anesthesia    hard to wake up and then panics waking up  . Coronary artery disease    severe CAD with mid RCA occlusion, distal LAD stenosis, moderate disease of the left circ. Repeat cath 07/2014 for worsening LVF showed severe disease of the mid LAD, mild to mod LCX disease, occluded PL of the RCA with left to right collaterals to distal RCA territory s/p PCI of the LAD with DES.  It was noted that if patient develops CP resistant to medical therapy could consider PCI of RCA  . Diastolic dysfunction   . Dilated aortic root ([place])   . ESRD on peritoneal dialysis ([place]) 09/2013   "followed by [name]; [facility]" (08/03/2014)  . Fibromyalgia    takes tramadol daily as needed  . GERD (gastroesophageal reflux disease)   . H/O vitamin D deficiency   . Hearing loss   . History of colon polyps   . Hypercholesteremia   . Hypertension   .  Myalgia and myositis   . Neuropathy ([place])   . Peripheral neuropathy ([place])   . S/P radiation therapy 01/24/2013-03/14/2013   Larynx/glottis / 63 Gy in 28 fractions  . Sleep apnea    to request from Dr.Fried was done about 55yrs ago but no cpap d/t weight loss  . Type II diabetes mellitus ([place])    "diet controlled now" (08/03/2014)  . 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 AV FISTULA PLACEMENT Left 02/23/2013   Procedure:  ARTERIOVENOUS (AV) FISTULA CREATION;  Surgeon: Conrad Truchas, MD;  Location: [facility];  Service: Vascular;  Laterality: Left;  . CAPD INSERTION N/A 08/01/2013   Procedure: LAPAROSCOPIC INSERTION CONTINUOUS AMBULATORY PERITONEAL DIALYSIS CATHETER;  Surgeon: [name], MD;  Location: [facility];  Service: General;  Laterality: N/A;  . CARDIAC CATHETERIZATION  07/2013  . CAROTID ENDARTERECTOMY Left   . CAROTID STENT INSERTION N/A 08/21/2014   Procedure: CAROTID STENT INSERTION;  Surgeon: [name], MD;  Location: [facility] CATH LAB;  Service: Cardiovascular;  Laterality: N/A;  . CHOLECYSTECTOMY  1993  . COLONOSCOPY W/ BIOPSIES AND POLYPECTOMY     Hx: of  . CORONARY ANGIOPLASTY  08/03/2014  . LAPAROSCOPIC ABDOMINAL EXPLORATION N/A 08/01/2013   Procedure: LAPAROSCOPIC ABDOMINAL EXPLORATION;  Surgeon: [name], MD;  Location: [place];  Service: General;  Laterality: N/A;  . LEFT AND RIGHT HEART CATHETERIZATION WITH CORONARY ANGIOGRAM N/A 08/03/2014   Procedure: LEFT AND RIGHT HEART CATHETERIZATION WITH CORONARY ANGIOGRAM;  Surgeon: [name], MD;  Location[facility] C[facility] LAB;  Service: Cardiovascular;  Laterality: N/A;  . LEFT HEART CATHETERIZATION WITH CORONARY ANGIOGRAM N/A 08/14/2013   Procedure: LEFT HEART CATHETERIZATION WITH CORONARY ANGIOGRAM;  Surgeon: [name], MD;  Location: [facility] CATH LAB;  Service: Cardiovascular;  Laterality: N/A;  . MICROLARYNGOSCOPY WITH LASER N/A 01/04/2013   Procedure: MICROLARYNGOSCOPY WITH BIOPSY/LASER;  Surgeon: [name], MD;  Location: [facility];  Service: ENT;  Laterality: N/A;  . SHOULDER OPEN ROTATOR CUFF REPAIR Right 07/06/2011  . TRACHEOSTOMY  01/2013  . TRACHEOSTOMY CLOSURE  02/2013  . ULNAR NERVE TRANSPOSITION Left 02/22/2014   Procedure: LEFT ULNAR NERVE RELEASE AND OR TRANSPOSITION;  Surgeon: [name], MD;  Location: [facility];  Service: Orthopedics;  Laterality: Left;    Social History:  reports that he quit smoking about 20 years ago. His smoking  use included Cigarettes. He has a 62.00 pack-year smoking history. He has never used smokeless tobacco. He reports that he does not drink alcohol or use drugs.  Family History:  Family History  Problem Relation Age of Onset  . Adopted: Yes  . Family history unknown: Yes     Prior to Admission medications   Medication Sig Start Date End Date Taking? Authorizing Provider  acetaminophen (TYLENOL) 500 MG tablet Take 1,000 mg by mouth every 6 (six) hours as needed for mild pain or headache.    Yes Historical Provider, MD  amLODipine (NORVASC) 5 MG tablet Take 5 mg by mouth daily.   Yes Historical Provider, MD  aspirin 81 MG chewable tablet Chew 81 mg by mouth daily.   Yes Historical Provider, MD  calcium acetate (PHOSLO) 667 MG capsule Take 667-1,334 mg by mouth See admin instructions. Take 2 capsules (1334 mg) every morning and at lunch. Depending on the evening meal, takes 667 mg (1 tablet) or 1334 mg (  2 tablets)   Yes Historical Provider, MD  gabapentin (NEURONTIN) 100 MG capsule Take 100 mg by mouth 2 (two) times daily.   Yes Historical Provider, MD  glipiZIDE (GLUCOTROL XL) 2.5 MG 24 hr tablet Take 2.5 mg by mouth daily as needed (for high blood sugar).  01/27/16  Yes Historical Provider, MD  ipratropium (ATROVENT) 0.06 % nasal spray Place 2 sprays into both nostrils 2 (two) times daily.    Yes Historical Provider, MD  lisinopril (PRINIVIL,ZESTRIL) 20 MG tablet Take 20-50 mg by mouth daily. Take 50mg  tablet by mouth at noon, then take the 20mg  tablet at night per family member   Yes Historical Provider, MD  metoprolol succinate (TOPROL-XL) 50 MG 24 hr tablet Take 50 mg by mouth 2 (two) times daily. Take with or immediately following a meal.   Yes Historical Provider, MD  multivitamin (RENA-VIT) TABS tablet Take 1 tablet by mouth daily.   Yes Historical Provider, MD  mupirocin ointment (BACTROBAN) 2 % Apply 1 application topically 2 (two) times daily as needed (rash).    Yes Historical Provider,  MD  nitroGLYCERIN (NITROSTAT) 0.4 MG SL tablet Place 1 tablet (0.4 mg total) under the tongue every 5 (five) minutes as needed for chest pain. 08/05/14  Yes Brittainy Erie Noe, PA-C  omeprazole (PRILOSEC) 20 MG capsule Take 20 mg by mouth every morning. 03/04/16  Yes Historical Provider, MD  rosuvastatin (CRESTOR) 5 MG tablet Take 1 tablet by mouth as directed up to once daily Patient taking differently: Take 5 mg by mouth 3 (three) times a week. Monday, Wednesday, Friday at noon 08/10/14  Yes Sueanne Margarita, MD  traMADol (ULTRAM) 50 MG tablet Take 100 mg by mouth every 4 (four) hours as needed (pain).   Yes Historical Provider, MD  triamcinolone cream (KENALOG) 0.1 % Apply 1 application topically 2 (two) times daily as needed (rash).  08/07/14  Yes Historical Provider, MD  oxyCODONE-acetaminophen (PERCOCET/ROXICET) 5-325 MG tablet Take 1 tablet by mouth every 6 (six) hours as needed for severe pain. Patient not taking: Reported on 04/17/2016 04/04/16   Orson Eva, MD    Physical Exam: Vitals:   04/03/2016 0100 03/28/2016 0200 04/05/2016 0300 04/05/2016 0400  BP: (!) 123/59 (!) 124/53 (!) 121/58 115/61  Pulse: 96 (!) 101 100 100  Resp: 20 (!) 22 (!) 29 (!) 22  Temp:      TempSrc:      SpO2: 92% 92% 92% 91%  Weight:      Height:       General: Not in acute distress HEENT:       Eyes: PERRL, EOMI, no scleral icterus.       ENT: No discharge from the ears and nose, no pharynx injection, no tonsillar enlargement.        Neck: No JVD, no bruit, no mass felt. Heme: No neck lymph node enlargement. Cardiac: S1/S2, RRR, No murmurs, No gallops or rubs. Respiratory:  No rales, wheezing, rhonchi or rubs. GI: mildly distended, diffused tenderness. No rebound pain, no organomegaly, BS present. Has PD cath in place. GU: No hematuria Ext: No pitting leg edema bilaterally. 2+DP/PT pulse bilaterally. Musculoskeletal: No joint deformities, No joint redness or warmth, no limitation of ROM in spin. Skin: No  rashes.  Neuro:  Confused, not oriented X3, cranial nerves II-XII grossly intact, moves all extremities normally.  Psych: Could not be reviewed due to altered mental status.  Labs on Admission: I have personally reviewed following labs and imaging studies  CBC:  Recent Labs Lab 04/04/16 0425 04/17/2016 1623  WBC 13.9* 10.4  NEUTROABS  --  7.6  HGB 10.2* 8.5*  HCT 33.1* 27.8*  MCV 106.8* 104.9*  PLT 304 [id number]   Basic Metabolic Panel:  Recent Labs Lab 04/04/16 0426 04/07/2016 1623 04/02/2016 2244  NA 132* 132*  --   K 2.7* 2.7*  --   CL 92* 93*  --   CO2 25 23  --   GLUCOSE 143* 71  --   BUN 32* 35*  --   CREATININE 10.52* 9.50*  --   CALCIUM 9.0 8.9  --   MG  --   --  1.6*  PHOS 6.5*  --   --    GFR: Estimated Creatinine Clearance: 7.7 mL/min (by C-G formula based on SCr of 9.5 mg/dL (H)). Liver Function Tests:  Recent Labs Lab 04/04/16 0426 03/28/2016 1623  AST  --  62*  ALT  --  33  ALKPHOS  --  77  BILITOT  --  0.4  PROT  --  5.1*  ALBUMIN 2.0* 1.7*    Recent Labs Lab 03/27/2016 1623  LIPASE 22   No results for input(s): AMMONIA in the last 168 hours. Coagulation Profile:  Recent Labs Lab 04/02/2016 2244  INR 1.34   Cardiac Enzymes: No results for input(s): CKTOTAL, CKMB, CKMBINDEX, TROPONINI in the last 168 hours. BNP (last 3 results) No results for input(s): PROBNP in the last 8760 hours. HbA1C: No results for input(s): HGBA1C in the last 72 hours. CBG:  Recent Labs Lab 04/03/16 1958 04/03/16 2134 04/04/16 0759 04/04/16 1206 03/25/2016 2255  GLUCAP 107* 134* 161* 116* 71   Lipid Profile: No results for input(s): CHOL, HDL, LDLCALC, TRIG, CHOLHDL, LDLDIRECT in the last 72 hours. Thyroid Function Tests: No results for input(s): TSH, T4TOTAL, FREET4, T3FREE, THYROIDAB in the last 72 hours. Anemia Panel: No results for input(s): VITAMINB12, FOLATE, FERRITIN, TIBC, IRON, RETICCTPCT in the last 72 hours. Urine analysis:    Component Value  Date/Time   COLORURINE YELLOW 08/07/2013 1855   APPEARANCEUR CLOUDY (A) 08/07/2013 1855   LABSPEC 1.020 08/07/2013 1855   PHURINE 7.5 08/07/2013 1855   GLUCOSEU 100 (A) 08/07/2013 1855   HGBUR MODERATE (A) 08/07/2013 1855   BILIRUBINUR SMALL (A) 08/07/2013 1855   KETONESUR NEGATIVE 08/07/2013 1855   PROTEINUR >300 (A) 08/07/2013 1855   UROBILINOGEN 1.0 08/07/2013 1855   NITRITE NEGATIVE 08/07/2013 1855   LEUKOCYTESUR TRACE (A) 08/07/2013 1855   Sepsis Labs: @LABRCNTIP (procalcitonin:4,lacticidven:4) ) Recent Results (from the past 240 hour(s))  Culture, blood (Routine x 2)     Status: None   Collection Time: 04/01/16  5:07 AM  Result Value Ref Range Status   Specimen Description BLOOD RIGHT ARM  Final   Special Requests BOTTLES DRAWN AEROBIC AND ANAEROBIC 5ML  Final   Culture NO GROWTH 5 DAYS  Final   Report Status 04/06/2016 FINAL  Final  Culture, blood (Routine x 2)     Status: None   Collection Time: 04/01/16  5:50 AM  Result Value Ref Range Status   Specimen Description BLOOD RIGHT HAND  Final   Special Requests AEROBIC BOTTLE ONLY 5ML  Final   Culture NO GROWTH 5 DAYS  Final   Report Status 04/06/2016 FINAL  Final  Culture, body fluid-bottle     Status: None   Collection Time: 04/01/16  8:12 AM  Result Value Ref Range Status   Specimen Description PERITONEAL  Final   Special Requests NONE  Final   Culture NO GROWTH 5 DAYS  Final   Report Status 04/07/2016 FINAL  Final  Gram stain     Status: None   Collection Time: 04/01/16  8:12 AM  Result Value Ref Range Status   Specimen Description PERITONEAL  Final   Special Requests NONE  Final   Gram Stain   Final    FEW WBC PRESENT, PREDOMINANTLY PMN NO ORGANISMS SEEN    Report Status 04/01/2016 FINAL  Final  MRSA PCR Screening     Status: None   Collection Time: 04/01/16  5:49 PM  Result Value Ref Range Status   MRSA by PCR NEGATIVE NEGATIVE Final    Comment:        The GeneXpert MRSA Assay (FDA approved for NASAL  specimens only), is one component of a comprehensive MRSA colonization surveillance program. It is not intended to diagnose MRSA infection nor to guide or monitor treatment for MRSA infections.   C difficile quick scan w PCR reflex     Status: None   Collection Time: 04/05/2016  4:07 PM  Result Value Ref Range Status   C Diff antigen NEGATIVE NEGATIVE Final   C Diff toxin NEGATIVE NEGATIVE Final   C Diff interpretation No C. difficile detected.  Final     Radiological Exams on Admission: Dg Chest Port 1 View  Result Date: 04/10/2016 CLINICAL DATA:  Sepsis.  History of head and neck cancer. EXAM: PORTABLE CHEST 1 VIEW COMPARISON:  Chest radiograph April 01, 2016 FINDINGS: Cardiac silhouette is mildly enlarged unchanged. Heavily calcified aortic. Diffuse interstitial prominence. Worsening retrocardiac consolidation and small LEFT pleural effusion. RIGHT lung base strandy densities. No pneumothorax. Surgical clips LEFT neck, stent projects in RIGHT neck and LEFT subclavian vessels. Postsurgical changes RIGHT distal clavicle. Bibasilar strandy densities IMPRESSION: Stable cardiomegaly. Worsening retrocardiac consolidation with small LEFT pleural effusion. RIGHT lung base atelectasis/ scarring. Worsening bronchitic changes. Electronically Signed   By: Elon Alas M.D.   On: 03/29/2016 19:40     EKG: Independently reviewed.  QTC 459, nonspecific T-wave change.   Assessment/Plan Principal Problem:   Peritonitis ([place]) Active Problems:   HTN (hypertension)   Anemia of chronic disease   Continuous ambulatory peritoneal dialysis status ([place])   ESRD on dialysis ([place])   Dilated aortic root ([facility])   CAD (coronary artery disease), native coronary artery   Dyslipidemia   Chronic systolic congestive heart failure ([facility])   Carotid stenosis   Abdominal pain   Diabetes mellitus with complication ([facility])   Hypokalemia   Acute encephalopathy   Peritonitis ([place]) and sepsis: his fluid  cultures have all been negative, but has high cell count in fluid, indicating persistent or recurrent peritonitis. Pt is septic with elevated lactate, tachycardia, tachypnea and hypotension. Blood pressure responded to IV fluid resuscitation, currently 105/95. Nephrology, Dr. gross was consulted. The plan is to removal of PD cath and prn HD. General surgeon was consulted  -will admit to SDU as inpt. -IV vancomycin and Zosyn -We had Diflucan per Dr. Jonnie Finner recommendation  Acute encephalopathy: Most likely due to sepsis. -Hold oral medications -Continue our check -Treat underlying infection as above  ESRD-on PD:  - hold off on PD for now per Dr. Jonnie Finner, prn HD as needed.   Hypokalemia: K= 2.7     on admission. - Repleted with KCl 10 mEQ x 6 - Check Mg level  CAD: s/p of stent. No CP -continue aspirin per rectal, IV metoprolol  HTN: -hold oral meds -IV hydralazine  when necessary and IV metoprolol with holding parameters.  Chronic systolic congestive heart failure: 2-D echo on 10/08/14 showed EF 30-35 percent. Patient does not have leg edema or JVD. CHF seems to be compensated on admission. Pt is not on diuretics at home. -Watch volume status closely  DM-II: Last A1c 5.5, well controled. Patient is taking glipizide at home -SSI   Sepsis - Ressessment   Performed at:    4:20 AM   Last Vitals:    Blood pressure 124/66, pulse 95, temperature 97.8 F (36.6 C), temperature source Oral, resp. rate 19, weight 78 kg (172 lb), SpO2 92 %.  Heart:                 Regular rhythm Tachchycarida  Lungs:     Clear to auscultation  Capillary Refill:   < 2 seconds    Peripheral Pulse (include location): Brachial pulse palpable   Skin (include color):               Normal    DVT ppx: SCD Code Status: Full code Family Communication: None at bed side. Disposition Plan:  Anticipate discharge back to previous home environment Consults called:  Nephrology, Dr. Jonnie Finner and general  surgeon Admission status:  SDU/inpation       Date of Service 04/05/2016    Ivor Costa [facility] Pager [phone]  If 7PM-7AM, please contact night-coverage [email or web address] Password [email or web address] 04/20/2016, 4:20 AM

## 2016-04-09 NOTE — Consult Note (Signed)
Renal Service Consult Note [place] 04/13/2016 [place] D Requesting Physician:  Dr Jeneen Rinks, ED  Reason for Consult:  ESRD pt w/ refractory peritonitis HPI: The patient is a 70 y.o. 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  He presents to ED sent by renal MD for PD cath removal for refractory culture negative peritonitis.    History per pts wife.  Had n/v/d 2wks ago, seen by PCP and sent to ED , rx for "virus". Got worse w abd pain, fluid test done showing peritoneal fluid infection, got IP abx at PD clinic.  Got worse, was admitted here for 4 days last weekend, got a little better.  Now worse again w anorexia, wt loss, marked weakness and cell count in PD fluid from yesterday remains very high 5,285.  His renal physician told him to go to ED and has d/w surgeon's office about removal of PD cath.    Patient is tired, no specific c/o.  No fliud in abd now as was drained prior to coming by his wife.   Per wife pt had laryngeal cancer rx'd in 2014 w XRT, had trach for a while.  Had L carotid open surgically and R one stented, has one heart stent in place.  Per chart last ECHO shows EF 30%.  No recent CP or MI.  Was on HD starting in 2014 then switched to PD in 2015 and has been doing well.  Has DM type 2 but is controlled w only prn glucotrol for BS > 250.  Takes lisinopril and metoprolol for HTN.  BP's in ED low today 90's.        ROS  denies CP  no joint pain   no HA  no blurry vision  no dysuria  no difficulty voiding  no change in urine color    Past Medical History  Past Medical History:  Diagnosis Date  . Aortic regurgitation   . Aortic valve sclerosis   . Arthritis    "just age related" (08/03/2014)  . Carotid artery occlusion    s/p left CEA with 40-59%  restenosis and >80% stenosis of the Right ICA - followed by Dr. Kellie Simmering  . Cataracts, bilateral   . Chronic combined systolic and diastolic CHF (congestive heart failure) ([place])   . Complication of anesthesia    hard to wake up and then panics waking up  . Coronary artery disease    severe CAD with mid RCA occlusion, distal LAD stenosis, moderate disease of the left circ. Repeat cath 07/2014 for worsening LVF showed severe disease of the mid LAD, mild to mod LCX disease, occluded PL of the RCA with left to right collaterals to distal RCA territory s/p PCI of the LAD with DES.  It was noted that if patient develops CP resistant to medical therapy could consider PCI of RCA  . Diastolic dysfunction   . Dilated aortic root ([place])   . ESRD on peritoneal dialysis ([place]) 09/2013   "followed by [name]; [facility]" (08/03/2014)  . Fibromyalgia    takes tramadol daily as needed  . GERD (gastroesophageal reflux disease)   . H/O vitamin D deficiency   . Hearing loss   . History of colon polyps   .  Hypercholesteremia   . Hypertension   . Myalgia and myositis   . Neuropathy ([place])   . Peripheral neuropathy ([place])   . S/P radiation therapy 01/24/2013-03/14/2013   Larynx/glottis / 63 Gy in 28 fractions  . Sleep apnea    to request from Dr.Fried was done about 44yrs ago but no cpap d/t weight loss  . Type II diabetes mellitus ([place])    "diet controlled now" (08/03/2014)  . 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 AV FISTULA PLACEMENT Left 02/23/2013   Procedure: ARTERIOVENOUS (AV) FISTULA CREATION;  Surgeon: Conrad Anderson, MD;  Location: [place];  Service: Vascular;  Laterality: Left;  . CAPD INSERTION N/A 08/01/2013   Procedure: LAPAROSCOPIC INSERTION CONTINUOUS AMBULATORY PERITONEAL DIALYSIS CATHETER;  Surgeon: [name], MD;  Location: [place];  Service:  General;  Laterality: N/A;  . CARDIAC CATHETERIZATION  07/2013  . CAROTID ENDARTERECTOMY Left   . CAROTID STENT INSERTION N/A 08/21/2014   Procedure: CAROTID STENT INSERTION;  Surgeon: [name], MD;  Location: [facility] CATH LAB;  Service: Cardiovascular;  Laterality: N/A;  . CHOLECYSTECTOMY  1993  . COLONOSCOPY W/ BIOPSIES AND POLYPECTOMY     Hx: of  . CORONARY ANGIOPLASTY  08/03/2014  . LAPAROSCOPIC ABDOMINAL EXPLORATION N/A 08/01/2013   Procedure: LAPAROSCOPIC ABDOMINAL EXPLORATION;  Surgeon: [name], MD;  Location: [place];  Service: General;  Laterality: N/A;  . LEFT AND RIGHT HEART CATHETERIZATION WITH CORONARY ANGIOGRAM N/A 08/03/2014   Procedure: LEFT AND RIGHT HEART CATHETERIZATION WITH CORONARY ANGIOGRAM;  Surgeon: [name], MD;  Location: [facility] CATH LAB;  Service: Cardiovascular;  Laterality: N/A;  . LEFT HEART CATHETERIZATION WITH CORONARY ANGIOGRAM N/A 08/14/2013   Procedure: LEFT HEART CATHETERIZATION WITH CORONARY ANGIOGRAM;  Surgeon: [name], MD;  Location: [facility] CATH LAB;  Service: Cardiovascular;  Laterality: N/A;  . MICROLARYNGOSCOPY WITH LASER N/A 01/04/2013   Procedure: MICROLARYNGOSCOPY WITH BIOPSY/LASER;  Surgeon: [name], MD;  Location: [place];  Service: ENT;  Laterality: N/A;  . SHOULDER OPEN ROTATOR CUFF REPAIR Right 07/06/2011  . TRACHEOSTOMY  01/2013  . TRACHEOSTOMY CLOSURE  02/2013  . ULNAR NERVE TRANSPOSITION Left 02/22/2014   Procedure: LEFT ULNAR NERVE RELEASE AND OR TRANSPOSITION;  Surgeon: [name], MD;  Location: [place];  Service: Orthopedics;  Laterality: Left;   Family History  Family History  Problem Relation Age of Onset  . Adopted: Yes  . Family history unknown: Yes   Social History  reports that he quit smoking about 20 years ago. His smoking use included Cigarettes. He has a 62.00 pack-year smoking history. He has never used smokeless tobacco. He reports that he does not drink alcohol or use drugs. Allergies  Allergies   Allergen Reactions  . Fentanyl Other (See Comments)    Confusion when patch applied  . Nsaids Other (See Comments)    Severe kidney disease/ESRD  . Amoxicillin Diarrhea and Nausea And Vomiting  . Moxifloxacin Diarrhea and Nausea And Vomiting   Home medications Prior to Admission medications   Medication Sig Start Date End Date Taking? Authorizing Provider  acetaminophen (TYLENOL) 500 MG tablet Take 1,000 mg by mouth every 6 (six) hours as needed for mild pain or headache.     Historical Provider, MD  ALPRAZolam Duanne Moron) 0.5 MG tablet Take 0.5 mg by mouth at bedtime.  02/05/16  Historical Provider, MD  aspirin 325 MG EC tablet Take 325 mg by mouth daily. 01/31/16   Historical Provider, MD  calcium acetate (PHOSLO) 667 MG capsule Take 667-1,334 mg by mouth See admin instructions. Take 2 capsules (1334 mg) every morning and at lunch. Depending on the evening meal, takes 667 mg (1 tablet) or 1334 mg (2 tablets)    Historical Provider, MD  chlorhexidine (PERIDEX) 0.12 % solution Use as directed 15 mLs in the mouth or throat at bedtime.  02/26/16   Historical Provider, MD  DOCOSAHEXAENOIC ACID PO Take 2 g by mouth every morning.    Historical Provider, MD  gabapentin (NEURONTIN) 100 MG capsule Take 100 mg by mouth 2 (two) times daily.    Historical Provider, MD  gentamicin cream (GARAMYCIN) 0.1 % Apply 1 application topically daily. 03/10/16   Historical Provider, MD  glipiZIDE (GLUCOTROL XL) 2.5 MG 24 hr tablet Take 2.5 mg by mouth daily as needed (for high blood sugar).  01/27/16   Historical Provider, MD  ipratropium (ATROVENT) 0.06 % nasal spray Place 2 sprays into both nostrils 2 (two) times daily.     Historical Provider, MD  lisinopril (PRINIVIL,ZESTRIL) 20 MG tablet Take 20 mg by mouth at bedtime. 03/17/16   Historical Provider, MD  metoprolol succinate (TOPROL-XL) 50 MG 24 hr tablet Take 50 mg by mouth 2 (two) times daily. Take with or immediately following a meal.    Historical Provider, MD   multivitamin (RENA-VIT) TABS tablet Take 1 tablet by mouth daily.    Historical Provider, MD  mupirocin ointment (BACTROBAN) 2 % Apply 1 application topically 2 (two) times daily as needed (rash).     Historical Provider, MD  nitroGLYCERIN (NITROSTAT) 0.4 MG SL tablet Place 1 tablet (0.4 mg total) under the tongue every 5 (five) minutes as needed for chest pain. 08/05/14   Brittainy Erie Noe, PA-C  omeprazole (PRILOSEC) 20 MG capsule Take 20 mg by mouth every morning. 03/04/16   Historical Provider, MD  oxyCODONE-acetaminophen (PERCOCET/ROXICET) 5-325 MG tablet Take 1 tablet by mouth every 6 (six) hours as needed for severe pain. 04/04/16   Orson Eva, MD  rosuvastatin (CRESTOR) 5 MG tablet Take 1 tablet by mouth as directed up to once daily Patient taking differently: Take 5 mg by mouth 3 (three) times a week. Monday, Wednesday, Friday at noon 08/10/14   Sueanne Margarita, MD  traMADol (ULTRAM) 50 MG tablet Take 100 mg by mouth every 4 (four) hours as needed (pain).    Historical Provider, MD  triamcinolone cream (KENALOG) 0.1 % Apply 1 application topically 2 (two) times daily as needed (rash).  08/07/14   Historical Provider, MD   Liver Function Tests  Recent Labs Lab 04/04/16 0426  ALBUMIN 2.0*   No results for input(s): LIPASE, AMYLASE in the last 168 hours. CBC  Recent Labs Lab 04/04/16 0425 04/16/2016 1623  WBC 13.9* 10.4  NEUTROABS  --  PENDING  HGB 10.2* 8.5*  HCT 33.1* 27.8*  MCV 106.8* 104.9*  PLT 304 [id number]   Basic Metabolic Panel  Recent Labs Lab 04/04/16 0426  NA 132*  K 2.7*  CL 92*  CO2 25  GLUCOSE 143*  BUN 32*  CREATININE 10.52*  CALCIUM 9.0  PHOS 6.5*   Iron/TIBC/Ferritin/ %Sat    Component Value Date/Time   IRON 17 (L) 02/10/2013 1326   TIBC 160 (L) 02/10/2013 1326   FERRITIN >16,500 (H) 05/23/2013 1235   IRONPCTSAT 11 (L) 02/10/2013 1326    Vitals:  03/27/2016 1609 04/10/2016 1615 04/05/2016 1624 04/01/2016 1626  BP: (!) 89/47 (!) 102/46    Pulse:  97  95   Resp: 12 16  16   Temp:      TempSrc:      SpO2:  100%  100%  Weight:   78 kg (172 lb)    Exam Gen drowsy, arousable, pale, weak and tired No rash, cyanosis or gangrene Sclera anicteric, throat clear and dry  No jvd or bruits Chest clear bilat RRR 2/6 sem no RG Abd soft ntnd no mass or ascites +bs  PD cath in LUQ clean exit site GU normal male MS no joint effusions or deformity Ext no LE or UE edema / no wounds or ulcers Neuro is somnolent, arousable, nonfocal and appropriate    Dialysis: [facility] hangs 3 five liter bags of 2.5% at 6 pm they do a pause then 4 exchanges overnight, then a day bag of 1000 cc.  Then a manual exchange of 1000 cc at noon.    Assessment: 1. Refractory peritonitis - judging by symptoms and high cell count in fluid.  Cultures have all been negative.  Could have fungal infection, will add Diflucan just in case. Is getting vanc/ zosyn IV now in ED.  Have spoken w gen surgery, they will see for removal of PD cath.   2. Hypotension - looks very dry, hopefully will stabilize w IVF's.   3. ESRD - hold off on PD for now. Stabilize patient.  Prn HD as needed.  4. Hypokalemia - give IV KCl 5. CAD hx stent x 1 6. CHF last echo EF 30%, no signs of decomp CHF at this time.     Plan - as above  Kelly Splinter MD [employer] pager [phone]   03/23/2016, 5:25 PM

## 2016-04-09 NOTE — ED Notes (Signed)
Renal RN unable to obtain peritoneal fluid specimen.  Per Dr. Jonnie Finner okay to begin abx.

## 2016-04-10 ENCOUNTER — Inpatient Hospital Stay (HOSPITAL_COMMUNITY): Payer: Medicare Other | Admitting: Anesthesiology

## 2016-04-10 ENCOUNTER — Encounter (HOSPITAL_COMMUNITY): Admission: EM | Disposition: E | Payer: Self-pay | Source: Home / Self Care | Attending: Internal Medicine

## 2016-04-10 ENCOUNTER — Encounter (HOSPITAL_COMMUNITY): Payer: Self-pay

## 2016-04-10 DIAGNOSIS — I2510 Atherosclerotic heart disease of native coronary artery without angina pectoris: Secondary | ICD-10-CM

## 2016-04-10 HISTORY — PX: PORTACATH PLACEMENT: SHX2246

## 2016-04-10 LAB — GASTROINTESTINAL PANEL BY PCR, STOOL (REPLACES STOOL CULTURE)
Adenovirus F40/41: NOT DETECTED
Astrovirus: NOT DETECTED
CAMPYLOBACTER SPECIES: NOT DETECTED
CYCLOSPORA CAYETANENSIS: NOT DETECTED
Cryptosporidium: NOT DETECTED
ENTAMOEBA HISTOLYTICA: NOT DETECTED
Enteroaggregative E coli (EAEC): NOT DETECTED
Enteropathogenic E coli (EPEC): NOT DETECTED
Enterotoxigenic E coli (ETEC): NOT DETECTED
Giardia lamblia: NOT DETECTED
NOROVIRUS GI/GII: NOT DETECTED
PLESIMONAS SHIGELLOIDES: NOT DETECTED
Rotavirus A: NOT DETECTED
SALMONELLA SPECIES: NOT DETECTED
SAPOVIRUS (I, II, IV, AND V): NOT DETECTED
SHIGA LIKE TOXIN PRODUCING E COLI (STEC): NOT DETECTED
SHIGELLA/ENTEROINVASIVE E COLI (EIEC): NOT DETECTED
VIBRIO CHOLERAE: NOT DETECTED
VIBRIO SPECIES: NOT DETECTED
Yersinia enterocolitica: NOT DETECTED

## 2016-04-10 LAB — GLUCOSE, CAPILLARY
GLUCOSE-CAPILLARY: 71 mg/dL (ref 65–99)
GLUCOSE-CAPILLARY: 78 mg/dL (ref 65–99)
Glucose-Capillary: 78 mg/dL (ref 65–99)
Glucose-Capillary: 77 mg/dL (ref 65–99)

## 2016-04-10 LAB — COMPREHENSIVE METABOLIC PANEL
ALBUMIN: 1.5 g/dL — AB (ref 3.5–5.0)
ALK PHOS: 69 U/L (ref 38–126)
ALT: 31 U/L (ref 17–63)
AST: 63 U/L — AB (ref 15–41)
Anion gap: 11 (ref 5–15)
BUN: 33 mg/dL — AB (ref 6–20)
CO2: 18 mmol/L — AB (ref 22–32)
Calcium: 7.8 mg/dL — ABNORMAL LOW (ref 8.9–10.3)
Chloride: 103 mmol/L (ref 101–111)
Creatinine, Ser: 9.11 mg/dL — ABNORMAL HIGH (ref 0.61–1.24)
GFR calc Af Amer: 6 mL/min — ABNORMAL LOW (ref >60)
GFR calc non Af Amer: 5 mL/min — ABNORMAL LOW (ref >60)
GLUCOSE: 61 mg/dL — AB (ref 65–99)
Potassium: 3.5 mmol/L (ref 3.5–5.1)
SODIUM: 132 mmol/L — AB (ref 135–145)
TOTAL PROTEIN: 4.8 g/dL — AB (ref 6.5–8.1)
Total Bilirubin: 0.6 mg/dL (ref 0.3–1.2)

## 2016-04-10 LAB — CBC
HEMATOCRIT: 26.7 % — AB (ref 39.0–52.0)
Hemoglobin: 8.1 g/dL — ABNORMAL LOW (ref 13.0–17.0)
MCH: 31.9 pg (ref 26.0–34.0)
MCHC: 30.3 g/dL (ref 30.0–36.0)
MCV: 105.1 fL — ABNORMAL HIGH (ref 78.0–100.0)
Platelets: 207 10*3/uL (ref 150–400)
RBC: 2.54 MIL/uL — ABNORMAL LOW (ref 4.22–5.81)
RDW: 17.3 % — AB (ref 11.5–15.5)
WBC: 13.7 10*3/uL — ABNORMAL HIGH (ref 4.0–10.5)

## 2016-04-10 LAB — LACTIC ACID, PLASMA: Lactic Acid, Venous: 1.1 mmol/L (ref 0.5–1.9)

## 2016-04-10 LAB — PROCALCITONIN: Procalcitonin: 3.64 ng/mL

## 2016-04-10 LAB — MRSA PCR SCREENING: MRSA BY PCR: NEGATIVE

## 2016-04-10 SURGERY — INSERTION, TUNNELED CENTRAL VENOUS DEVICE, WITH PORT
Anesthesia: General | Site: Abdomen

## 2016-04-10 MED ORDER — LIDOCAINE HCL (CARDIAC) 20 MG/ML IV SOLN
INTRAVENOUS | Status: DC | PRN
  Administered 2016-04-10: 100 mg via INTRAVENOUS

## 2016-04-10 MED ORDER — GLYCOPYRROLATE 0.2 MG/ML IV SOSY
PREFILLED_SYRINGE | INTRAVENOUS | Status: AC
  Filled 2016-04-10: qty 3

## 2016-04-10 MED ORDER — HEPARIN SODIUM (PORCINE) 1000 UNIT/ML DIALYSIS
1000.0000 [IU] | INTRAMUSCULAR | Status: DC | PRN
  Filled 2016-04-10 (×2): qty 1

## 2016-04-10 MED ORDER — HYDROMORPHONE HCL 1 MG/ML IJ SOLN
0.2000 mg | INTRAMUSCULAR | Status: DC | PRN
  Administered 2016-04-11 (×2): 0.2 mg via INTRAVENOUS

## 2016-04-10 MED ORDER — SODIUM CHLORIDE 0.9 % IV SOLN: 100.0000 mL | INTRAVENOUS | Status: DC | PRN

## 2016-04-10 MED ORDER — ONDANSETRON HCL 4 MG/2ML IJ SOLN: 4.0000 mg | Freq: Once | INTRAMUSCULAR | Status: DC | PRN

## 2016-04-10 MED ORDER — LIDOCAINE HCL (PF) 1 % IJ SOLN: 5.0000 mL | INTRAMUSCULAR | Status: DC | PRN

## 2016-04-10 MED ORDER — LIDOCAINE-PRILOCAINE 2.5-2.5 % EX CREA
1.0000 "application " | TOPICAL_CREAM | CUTANEOUS | Status: DC | PRN
  Filled 2016-04-10: qty 5

## 2016-04-10 MED ORDER — GLYCOPYRROLATE 0.2 MG/ML IJ SOLN
INTRAMUSCULAR | Status: DC | PRN
  Administered 2016-04-10: .4 mg via INTRAVENOUS

## 2016-04-10 MED ORDER — PHENYLEPHRINE HCL 10 MG/ML IJ SOLN
INTRAMUSCULAR | Status: DC | PRN
  Administered 2016-04-10 (×3): 120 ug via INTRAVENOUS

## 2016-04-10 MED ORDER — NEOSTIGMINE METHYLSULFATE 5 MG/5ML IV SOSY
PREFILLED_SYRINGE | INTRAVENOUS | Status: AC
  Filled 2016-04-10: qty 5

## 2016-04-10 MED ORDER — ONDANSETRON HCL 4 MG/2ML IJ SOLN
INTRAMUSCULAR | Status: AC
  Filled 2016-04-10: qty 2

## 2016-04-10 MED ORDER — BUPIVACAINE-EPINEPHRINE (PF) 0.5% -1:200000 IJ SOLN
INTRAMUSCULAR | Status: AC
  Filled 2016-04-10: qty 30

## 2016-04-10 MED ORDER — ROCURONIUM BROMIDE 10 MG/ML (PF) SYRINGE
PREFILLED_SYRINGE | INTRAVENOUS | Status: AC
  Filled 2016-04-10: qty 10

## 2016-04-10 MED ORDER — PROPOFOL 10 MG/ML IV BOLUS
INTRAVENOUS | Status: AC
  Filled 2016-04-10: qty 20

## 2016-04-10 MED ORDER — PROPOFOL 10 MG/ML IV BOLUS
INTRAVENOUS | Status: DC | PRN
  Administered 2016-04-10: 100 mg via INTRAVENOUS

## 2016-04-10 MED ORDER — BUPIVACAINE-EPINEPHRINE 0.5% -1:200000 IJ SOLN
INTRAMUSCULAR | Status: DC | PRN
  Administered 2016-04-10: 20 mL

## 2016-04-10 MED ORDER — 0.9 % SODIUM CHLORIDE (POUR BTL) OPTIME
TOPICAL | Status: DC | PRN
  Administered 2016-04-10: 1000 mL

## 2016-04-10 MED ORDER — SODIUM CHLORIDE 0.9 % IV SOLN
INTRAVENOUS | Status: DC | PRN
  Administered 2016-04-10: 11:00:00 via INTRAVENOUS

## 2016-04-10 MED ORDER — NEOSTIGMINE METHYLSULFATE 10 MG/10ML IV SOLN
INTRAVENOUS | Status: DC | PRN
  Administered 2016-04-10: 3 mg via INTRAVENOUS
  Administered 2016-04-10 (×2): 1 mg via INTRAVENOUS

## 2016-04-10 MED ORDER — PENTAFLUOROPROP-TETRAFLUOROETH EX AERO: 1.0000 "application " | INHALATION_SPRAY | CUTANEOUS | Status: DC | PRN

## 2016-04-10 MED ORDER — LIDOCAINE 2% (20 MG/ML) 5 ML SYRINGE
INTRAMUSCULAR | Status: AC
  Filled 2016-04-10: qty 5

## 2016-04-10 MED ORDER — ALTEPLASE 2 MG IJ SOLR: 2.0000 mg | Freq: Once | INTRAMUSCULAR | Status: DC | PRN

## 2016-04-10 MED ORDER — FENTANYL CITRATE (PF) 100 MCG/2ML IJ SOLN
INTRAMUSCULAR | Status: AC
  Filled 2016-04-10: qty 4

## 2016-04-10 MED ORDER — SUCCINYLCHOLINE 20MG/ML (10ML) SYRINGE FOR MEDFUSION PUMP - OPTIME
INTRAMUSCULAR | Status: DC | PRN
  Administered 2016-04-10: 80 mg via INTRAVENOUS

## 2016-04-10 MED ORDER — DEXTROSE 5 % IV SOLN
INTRAVENOUS | Status: DC | PRN
  Administered 2016-04-10: 40 ug/min via INTRAVENOUS

## 2016-04-10 MED ORDER — FENTANYL CITRATE (PF) 100 MCG/2ML IJ SOLN
INTRAMUSCULAR | Status: DC | PRN
  Administered 2016-04-10 (×4): 50 ug via INTRAVENOUS

## 2016-04-10 MED ORDER — ROCURONIUM BROMIDE 10 MG/ML (PF) SYRINGE
PREFILLED_SYRINGE | INTRAVENOUS | Status: DC | PRN
  Administered 2016-04-10: 20 mg via INTRAVENOUS

## 2016-04-10 SURGICAL SUPPLY — 58 items
ADAPTER CATH SAFE LCK II CO PK (MISCELLANEOUS) ×1 IMPLANT
ADAPTER SAFE LOCK II CO PACK (MISCELLANEOUS)
ADH SKN CLS LQ APL DERMABOND (GAUZE/BANDAGES/DRESSINGS) ×1
ADPR DLYS CATH INSRT WRNCH TY (MISCELLANEOUS)
BLADE SURG 10 STRL SS (BLADE) ×2 IMPLANT
BLADE SURG 15 STRL LF DISP TIS (BLADE) IMPLANT
BLADE SURG 15 STRL SS (BLADE) ×3
CANISTER SUCTION 2500CC (MISCELLANEOUS) ×2 IMPLANT
CATH MONCRIEF POPOVICH (CATHETERS) IMPLANT
CATH SWAN NECK TORON 7CM LT (CATHETERS) IMPLANT
CHLORAPREP W/TINT 26ML (MISCELLANEOUS) ×3 IMPLANT
COIL SWAN NECK LT (MISCELLANEOUS) IMPLANT
COIL SWAN NECK RT (MISCELLANEOUS) IMPLANT
COVER SURGICAL LIGHT HANDLE (MISCELLANEOUS) ×3 IMPLANT
DECANTER SPIKE VIAL GLASS SM (MISCELLANEOUS) ×5 IMPLANT
DERMABOND ADHESIVE PROPEN (GAUZE/BANDAGES/DRESSINGS) ×2
DERMABOND ADVANCED .7 DNX6 (GAUZE/BANDAGES/DRESSINGS) ×1 IMPLANT
DRAPE LAPAROTOMY T 102X78X121 (DRAPES) ×3 IMPLANT
DRAPE UTILITY XL STRL (DRAPES) ×6 IMPLANT
ELECT CAUTERY BLADE 6.4 (BLADE) ×3 IMPLANT
ELECT COATED BLADE 2.86 ST (ELECTRODE) ×2 IMPLANT
ELECT REM PT RETURN 9FT ADLT (ELECTROSURGICAL) ×3
ELECTRODE REM PT RTRN 9FT ADLT (ELECTROSURGICAL) ×1 IMPLANT
GAUZE SPONGE 4X4 12PLY STRL (GAUZE/BANDAGES/DRESSINGS) ×3 IMPLANT
GAUZE SPONGE 4X4 16PLY XRAY LF (GAUZE/BANDAGES/DRESSINGS) ×3 IMPLANT
GLOVE BIOGEL PI IND STRL 8 (GLOVE) ×1 IMPLANT
GLOVE BIOGEL PI INDICATOR 8 (GLOVE) ×2
GLOVE ECLIPSE 7.5 STRL STRAW (GLOVE) ×1 IMPLANT
GOWN STRL REUS W/ TWL LRG LVL3 (GOWN DISPOSABLE) IMPLANT
GOWN STRL REUS W/ TWL XL LVL3 (GOWN DISPOSABLE) ×1 IMPLANT
GOWN STRL REUS W/TWL LRG LVL3 (GOWN DISPOSABLE) ×3
GOWN STRL REUS W/TWL XL LVL3 (GOWN DISPOSABLE) ×3
KIT BASIN OR (CUSTOM PROCEDURE TRAY) ×3 IMPLANT
KIT ROOM TURNOVER OR (KITS) ×3 IMPLANT
NEEDLE HYPO 25GX1X1/2 BEV (NEEDLE) ×3 IMPLANT
NS IRRIG 1000ML POUR BTL (IV SOLUTION) ×3 IMPLANT
PACK SURGICAL SETUP 50X90 (CUSTOM PROCEDURE TRAY) ×3 IMPLANT
PAD ARMBOARD 7.5X6 YLW CONV (MISCELLANEOUS) ×6 IMPLANT
PENCIL BUTTON HOLSTER BLD 10FT (ELECTRODE) ×3 IMPLANT
SET EXTENSION TUBING 8  CATH (SET/KITS/TRAYS/PACK) IMPLANT
SET Y SINGLE USE DIALYSIS (HEMODIALYSIS SUPPLIES) IMPLANT
SPONGE LAP 4X18 X RAY DECT (DISPOSABLE) ×3 IMPLANT
STAPLER VISISTAT 35W (STAPLE) ×2 IMPLANT
SUT CHROMIC 3 0 SH 27 (SUTURE) ×2 IMPLANT
SUT ETHILON 5 0 PS 2 18 (SUTURE) ×1 IMPLANT
SUT MNCRL AB 4-0 PS2 18 (SUTURE) ×2 IMPLANT
SUT PROLENE 2 0 CT2 30 (SUTURE) IMPLANT
SUT VIC AB 0 CT2 27 (SUTURE) ×6 IMPLANT
SUT VIC AB 2-0 CT3 27 (SUTURE) IMPLANT
SYR 30ML LL (SYRINGE) ×1 IMPLANT
SYR BULB 3OZ (MISCELLANEOUS) IMPLANT
SYR CONTROL 10ML LL (SYRINGE) ×3 IMPLANT
TOWEL OR 17X24 6PK STRL BLUE (TOWEL DISPOSABLE) ×3 IMPLANT
TOWEL OR 17X26 10 PK STRL BLUE (TOWEL DISPOSABLE) ×3 IMPLANT
TUBE CONNECTING 12'X1/4 (SUCTIONS) ×1
TUBE CONNECTING 12X1/4 (SUCTIONS) ×1 IMPLANT
WATER STERILE IRR 1000ML POUR (IV SOLUTION) IMPLANT
YANKAUER SUCT BULB TIP NO VENT (SUCTIONS) ×2 IMPLANT

## 2016-04-10 NOTE — Progress Notes (Signed)
Plan - removal of PD catheter.  For refractory peritonitis.  Discussed with Dr. Johney Maine, who put catheter in in 08/01/2013. Discussed planned operation with his wife by phone.  She said he does much better with PD than hemodialysis.  She understands indications and risks of surgery.  The patient is disoriented, can not follow commands, and may be knows who he is.  We have received a verbal consent from his wife for the surgery.  Plan surgery later today.  Alphonsa Overall, MD, [facility] Surgery Pager: [phone] Office phone:  [phone]

## 2016-04-10 NOTE — Op Note (Signed)
04/01/2016  1:01 PM  PATIENT:  Victor Lane, 70 y.o., male, MRN: [id number]  PREOP DIAGNOSIS:  Sepsis, Infected peritoneal dialysis catheter  POSTOP DIAGNOSIS:   Infected peritoneal dialysis catheter  PROCEDURE:   Procedure(s):  REMOVAL OF PD CATHETER [picture at end of note]  SURGEON:   Alphonsa Overall, M.D.  ASSISTANT:   None   ANESTHESIA:   general  Anesthesiologist: Nilda Simmer, MD; Catalina Gravel, MD CRNA: Lance Coon, CRNA; Harden Mo, CRNA  General  EBL:  minimal  ml  BLOOD ADMINISTERED: none  DRAINS: none   LOCAL MEDICATIONS USED:   20 cc 1/4% marcaine  SPECIMEN:   None  COUNTS CORRECT:  YES  INDICATIONS FOR PROCEDURE:  Victor Lane is a 70 y.o. (DOB: [birth year]) white male whose primary care physician is FRIED, [name], MD and comes for removal of PD catheter.   The PD catheter was placed by Dr. [name] on 07/27/2013.  This is a Pharmacologist.  The patient has been seen by the renal service for refractory peritonitis.  Seen by dr. [name] who agrees that the PD cath should be removed.   The indications and risks of the surgery were explained to the patient's wife.  The patient is confused an unable to give consent.  The risks include, but are not limited to, infection, bleeding, and nerve injury.  PROCEDURE:  The patient was taken to OR room 1 at [facility].  He was already on IV antibiotics for sepsis. He was given a general anesthetic. His abdomen was prepped with ChloraPrep and sterilely draped.   A timeout was held and surgical checklist run.   He had a PD catheter exiting from his left upper quadrant. This PD catheter tracked towards his suprapubic area. I made one counterincision in the left lower quadrant to access the tubing and then tracked the tubing to the left inguinal area just to the left of midline. At this point I made a second incision which was suprapubic.   The first cuff lay just above the fascia I  then followed the tubing down into the peritoneal cavity where there was both a cuff and a small bulb to the PD catheter.   The entire PD catheter was removed. The peritoneal hole was closed with interrupted 0 Vicryl sutures. The anterior rectus fascia was closed with interrupted 0 Vicryl sutures. The subcutaneous tissues were irrigated with 600 mL of saline. The left groin incision was closed with staples. I did this because of his a question about infection of thought these would be either to remove. The left lower quadrant incision was closed with running 4-0 Monocryl suture and painted with Dermabond.   The patient was transferred to recovery room in good condition for return to the ICU.      Alphonsa Overall, MD, [facility] Surgery Pager: [phone] Office phone:  [phone]

## 2016-04-10 NOTE — Progress Notes (Signed)
  [facility] Progress Note   Subjective: no c/o, BP's much better today  Vitals:   04/06/2016 0200 04/13/2016 0300 03/25/2016 0400 03/23/2016 0803  BP: (!) 124/53 (!) 121/58 115/61 (!) 129/56  Pulse: (!) 101 100 100 95  Resp: (!) 22 (!) 29 (!) 22 19  Temp:   98.5 F (36.9 C) 97.6 F (36.4 C)  TempSrc:   Oral Oral  SpO2: 92% 92% 91% 90%  Weight:      Height:        Inpatient medications: . aspirin  150 mg Rectal Daily  . famotidine (PEPCID) IV  20 mg Intravenous Q12H  . fluconazole (DIFLUCAN) IV  200 mg Intravenous Q24H  . insulin aspart  0-5 Units Subcutaneous QHS  . insulin aspart  0-9 Units Subcutaneous TID WC  . ipratropium  2 spray Each Nare BID  . mouth rinse  15 mL Mouth Rinse BID  . piperacillin-tazobactam (ZOSYN)  IV  2.25 g Intravenous Q6H  . sodium chloride flush  3 mL Intravenous Q12H     morphine injection, mupirocin ointment, ondansetron, triamcinolone cream  Exam: Gen color is better today No jvd or bruits Chest soft bibasilar rales, no wheezing, WOB is normal RRR 2/6 sem no RG Abd soft ntnd no mass or ascites +bs  PD cath in LUQ clean exit site GU normal male MS no joint effusions or deformity Ext no LE or UE edema / no wounds or ulcers Neuro is somnolent, arousable, confused, moving all ext    Dialysis: PD diccontinued due to refractory infection  Assessment: 1. Refractory peritonitis, culture negative - TNC 3000-6000 in spite of IP then IV abx, persistent AMS, anorexia/ nausea. For cath removal today. Empiric IV abx and antifungals in process.   2. Hypotension - resolved 3. Vol - looks wet on CXR last night, hold IVF's now 4. ESRD - transitioning PD to HD, has AVF functional L arm. Plan HD tomorrow.  5. Hypokalemia - better 6. CAD hx stent x 1 7. CM EF 30% - CXR w early edema last night, dc IVF"s   Plan - as above   Kelly Splinter MD [facility] pager [phone]   04/12/2016, 9:15 AM    Recent Labs Lab  04/04/16 0426 04/01/2016 1623 04/07/2016 0512  NA 132* 132* 132*  K 2.7* 2.7* 3.5  CL 92* 93* 103  CO2 25 23 18*  GLUCOSE 143* 71 61*  BUN 32* 35* 33*  CREATININE 10.52* 9.50* 9.11*  CALCIUM 9.0 8.9 7.8*  PHOS 6.5*  --   --     Recent Labs Lab 04/04/16 0426 03/23/2016 1623 04/16/2016 0512  AST  --  62* 63*  ALT  --  33 31  ALKPHOS  --  77 69  BILITOT  --  0.4 0.6  PROT  --  5.1* 4.8*  ALBUMIN 2.0* 1.7* 1.5*    Recent Labs Lab 04/04/16 0425 03/27/2016 1623 03/24/2016 0512  WBC 13.9* 10.4 13.7*  NEUTROABS  --  7.6  --   HGB 10.2* 8.5* 8.1*  HCT 33.1* 27.8* 26.7*  MCV 106.8* 104.9* 105.1*  PLT 304 236 207   Iron/TIBC/Ferritin/ %Sat    Component Value Date/Time   IRON 17 (L) 02/10/2013 1326   TIBC 160 (L) 02/10/2013 1326   FERRITIN >16,500 (H) 05/23/2013 1235   IRONPCTSAT 11 (L) 02/10/2013 1326

## 2016-04-10 NOTE — Progress Notes (Signed)
[facility] PROGRESS NOTE  MOMEN GROSSO [id number] DOB: 1945-09-21 DOA: 04/13/2016   PCP: [name], MD     Assessment/Plan: Principal Problem:   Peritonitis ([place]) Active Problems:   HTN (hypertension)   Anemia of chronic disease   Continuous ambulatory peritoneal dialysis status ([place])   ESRD on dialysis ([place])   Dilated aortic root (HCC)   CAD (coronary artery disease), native coronary artery   Dyslipidemia   Chronic systolic congestive heart failure (HCC)   Carotid stenosis   Abdominal pain   Diabetes mellitus with complication (HCC)   Hypokalemia   Acute encephalopathy   70 y.o. 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  He presents to ED sent by renal MD for PD cath removal for refractory culture negative peritonitis. Per chart last ECHO shows EF 30%. Hypotensive on arrival. Possible small left lower lobe infiltrate.    Assessment and plan  Peritonitis ([place]) and sepsis: his fluid cultures have all been negative, but has high cell count in fluid, indicating persistent or recurrent peritonitis. Pt is suspected to have sepsis on presentation with elevated lactate, tachycardia, tachypnea and hypotension. Blood pressure responded to IV fluid resuscitation, currently 105/95. Nephrology, Dr. gross was consulted. The plan is to removal of PD cath and prn HD. General surgeon was consulted Continue SDU as inpt. -IV vancomycin and Zosyn -We had Diflucan per Dr. Jonnie Finner recommendation Scheduled for  cath removal today  Acute encephalopathy: Most likely due to sepsis. -Hold oral medications -Continue our check -Treat underlying infection as above  ESRD-on PD:  - hold off on PD for now per Dr. Jonnie Finner,    transitioning PD to HD, has AVF functional L arm. Plan HD  tomorrow  Hypokalemia: K= 2.7     on admission. - Repleted with KCl 10 mEQ x 6   Mg level> 1.5  CAD: s/p of stent. No CP -continue aspirin per rectal, IV metoprolol  HTN: -hold oral meds -IV hydralazine when necessary and IV metoprolol with holding parameters.  Chronic systolic congestive heart failure: 2-D echo on 10/08/14 showed EF 30-35 percent. Patient does not have leg edema or JVD. CHF seems to be compensated on admission. Pt is not on diuretics at home. -Watch volume status closely  DM-II: Last A1c 5.5, well controled. Patient is taking glipizide at home -SSI     DVT prophylaxsis heparin for surgery?  Code Status:  Full code    Family Communication: Discussed in detail with the patient's wife , all imaging results, lab results explained to the patient   Disposition Plan removal of PD catheter today     Consultants:  General surgery  Nephrology  Procedures:  None  Antibiotics: Anti-infectives    Start     Dose/Rate Route Frequency Ordered Stop   04/10/2016 2300  fluconazole (DIFLUCAN) IVPB 200 mg     200 mg 100 mL/hr over 60 Minutes Intravenous Every 24 hours 03/29/2016 2232     04/04/2016 2200  piperacillin-tazobactam (ZOSYN) IVPB 2.25 g     2.25 g 100 mL/hr over 30 Minutes Intravenous Every 6 hours 04/14/2016 1622     04/14/2016 1615  piperacillin-tazobactam (ZOSYN) IVPB 3.375 g     3.375 g 100 mL/hr over 30 Minutes Intravenous  Once 04/11/2016 1609 03/23/2016 1756   03/29/2016 1615  vancomycin (VANCOCIN) 1,500 mg in sodium chloride 0.9 % 500 mL IVPB     1,500 mg 250 mL/hr over 120 Minutes Intravenous  Once 04/20/2016 1609 04/15/2016 2140         HPI/Subjective: Patient somewhat confused, wife states that the patient has been confused for the last 1 week  Objective: Vitals:   03/22/2016 0100 03/28/2016 0200 04/17/2016 0300 04/15/2016 0400  BP: (!) 123/59 (!) 124/53 (!) 121/58 115/61  Pulse: 96 (!) 101 100 100  Resp: 20 (!) 22 (!) 29 (!) 22  Temp:    98.5 F  (36.9 C)  TempSrc:    Oral  SpO2: 92% 92% 92% 91%  Weight:      Height:        Intake/Output Summary (Last 24 hours) at 04/04/2016 0804 Last data filed at 03/27/2016 0400  Gross per 24 hour  Intake          4088.75 ml  Output                0 ml  Net          4088.75 ml    Exam:  Examination:  General exam: Confused  Respiratory system: Clear to auscultation. Respiratory effort normal. Cardiovascular system: S1 & S2 heard, RRR. No JVD, murmurs, rubs, gallops or clicks. No pedal edema. Gastrointestinal system: Abdomen is nondistended, soft and nontender. No organomegaly or masses felt. Normal bowel sounds heard. Central nervous system:  . No focal neurological deficits. Extremities: Symmetric 5 x 5 power. Skin: No rashes, lesions or ulcers Psychiatry: Judgement and insight appear normal. Mood & affect appropriate.     Data Reviewed: I have personally reviewed following labs and imaging studies  Micro Results Recent Results (from the past 240 hour(s))  Culture, blood (Routine x 2)     Status: None   Collection Time: 04/01/16  5:07 AM  Result Value Ref Range Status   Specimen Description BLOOD RIGHT ARM  Final   Special Requests BOTTLES DRAWN AEROBIC AND ANAEROBIC 5ML  Final   Culture NO GROWTH 5 DAYS  Final   Report Status 04/06/2016 FINAL  Final  Culture, blood (Routine x 2)     Status: None   Collection Time: 04/01/16  5:50 AM  Result Value Ref Range Status   Specimen Description BLOOD RIGHT HAND  Final   Special Requests AEROBIC BOTTLE ONLY 5ML  Final   Culture NO GROWTH 5 DAYS  Final   Report Status 04/06/2016 FINAL  Final  Culture, body fluid-bottle     Status: None   Collection Time: 04/01/16  8:12 AM  Result Value Ref Range Status   Specimen Description PERITONEAL  Final   Special Requests NONE  Final   Culture NO GROWTH 5 DAYS  Final   Report Status 04/07/2016 FINAL  Final  Gram stain     Status: None   Collection Time: 04/01/16  8:12 AM  Result Value Ref  Range Status   Specimen Description PERITONEAL  Final   Special Requests NONE  Final   Gram Stain   Final    FEW WBC PRESENT, PREDOMINANTLY PMN NO ORGANISMS SEEN    Report Status 04/01/2016 FINAL  Final  MRSA PCR Screening     Status: None   Collection Time: 04/01/16  5:49 PM  Result Value Ref Range Status   MRSA by PCR NEGATIVE NEGATIVE Final    Comment:  The GeneXpert MRSA Assay (FDA approved for NASAL specimens only), is one component of a comprehensive MRSA colonization surveillance program. It is not intended to diagnose MRSA infection nor to guide or monitor treatment for MRSA infections.   C difficile quick scan w PCR reflex     Status: None   Collection Time: 04/02/2016  4:07 PM  Result Value Ref Range Status   C Diff antigen NEGATIVE NEGATIVE Final   C Diff toxin NEGATIVE NEGATIVE Final   C Diff interpretation No C. difficile detected.  Final    Radiology Reports Dg Chest 2 View  Result Date: 04/01/2016 CLINICAL DATA:  Suspected sepsis.  Peritoneal dialysis patient. EXAM: CHEST  2 VIEW COMPARISON:  Chest radiograph 07/19/2015.  Abdominal CT 03/28/2016 FINDINGS: Stable cardiomegaly and mediastinal contours. There is thoracic aortic atherosclerosis. Small bilateral pleural effusions and bibasilar airspace disease. No pulmonary edema. No pneumothorax. Vascular stent in the left axilla. Free air under the right hemidiaphragm, likely related to peritoneal dialysis. Resorption of the distal right clavicle. IMPRESSION: Chronic cardiomegaly, stable.  Thoracic aortic atherosclerosis. Small bilateral pleural effusions with bibasilar airspace disease, likely atelectasis. Electronically Signed   By: Jeb Levering M.D.   On: 04/01/2016 05:40   Ct Abdomen Pelvis W Contrast  Result Date: 03/29/2016 CLINICAL DATA:  Acute onset of generalized abdominal pain. Recent peritoneal dialysis. Vomiting and diarrhea. Initial encounter. EXAM: CT ABDOMEN AND PELVIS WITH CONTRAST TECHNIQUE:  Multidetector CT imaging of the abdomen and pelvis was performed using the standard protocol following bolus administration of intravenous contrast. CONTRAST:  168mL ISOVUE-300 IOPAMIDOL (ISOVUE-300) INJECTION 61% COMPARISON:  CT of the abdomen and pelvis from 11/26/2014 FINDINGS: Lower chest: Trace right-sided pleural fluid is noted. Bibasilar atelectasis or scarring is seen. The visualized portions of the mediastinum are unremarkable. Hepatobiliary: A small amount of ascites is seen tracking about the liver. The liver is unremarkable in appearance. The patient is status post cholecystectomy, with clips noted at the gallbladder fossa. The common bile duct remains normal in caliber. Pancreas: The pancreas is within normal limits. Spleen: The spleen is unremarkable in appearance. Adrenals/Urinary Tract: The adrenal glands are grossly unremarkable in appearance. Severe chronic bilateral renal atrophy is noted, with scattered bilateral renal cysts. There is no evidence of hydronephrosis. No renal or ureteral stones are identified. No significant perinephric stranding is seen. Stomach/Bowel: The stomach is unremarkable in appearance. The small bowel is within normal limits. The appendix is normal in caliber, without evidence of appendicitis. Scattered diverticulosis is noted along the distal descending and sigmoid colon, without definite evidence of diverticulitis. Vascular/Lymphatic: Diffuse calcification is seen along the abdominal aorta and its branches. There is relatively severe luminal narrowing at the proximal right common iliac artery, and moderate luminal narrowing along the left common iliac artery. Relatively severe luminal narrowing is also noted along the proximal left superficial femoral artery. No retroperitoneal or pelvic sidewall lymphadenopathy is seen. Reproductive: The bladder is mildly distended and grossly unremarkable. The prostate remains normal in size. Other: The patient's peritoneal dialysis  catheter is noted within the pelvis, with a small to moderate amount of free fluid in the pelvis and along both paracolic gutters. Musculoskeletal: No acute osseous abnormalities are identified. The visualized musculature is unremarkable in appearance. IMPRESSION: 1. No acute abnormality seen to explain the patient's symptoms. 2. Status post recent peritoneal dialysis. Small to moderate volume ascites noted within the abdomen and pelvis. 3. Diffuse aortic atherosclerosis noted. Relatively severe luminal narrowing at the proximal right common iliac artery, and  moderate luminal narrowing along the left common iliac artery. Relatively severe luminal narrowing also noted along the proximal left superficial femoral artery. 4. Severe chronic bilateral renal atrophy, with scattered bilateral renal cysts. 5. Scattered diverticulosis along the distal descending and sigmoid colon, without definite evidence of diverticulitis. 6. Trace right-sided pleural fluid. Bibasilar atelectasis or scarring noted. Electronically Signed   By: Garald Balding M.D.   On: 03/29/2016 00:10   Dg Chest Port 1 View  Result Date: 04/16/2016 CLINICAL DATA:  Sepsis.  History of head and neck cancer. EXAM: PORTABLE CHEST 1 VIEW COMPARISON:  Chest radiograph April 01, 2016 FINDINGS: Cardiac silhouette is mildly enlarged unchanged. Heavily calcified aortic. Diffuse interstitial prominence. Worsening retrocardiac consolidation and small LEFT pleural effusion. RIGHT lung base strandy densities. No pneumothorax. Surgical clips LEFT neck, stent projects in RIGHT neck and LEFT subclavian vessels. Postsurgical changes RIGHT distal clavicle. Bibasilar strandy densities IMPRESSION: Stable cardiomegaly. Worsening retrocardiac consolidation with small LEFT pleural effusion. RIGHT lung base atelectasis/ scarring. Worsening bronchitic changes. Electronically Signed   By: Elon Alas M.D.   On: 03/23/2016 19:40   Ct Angio Abdomen Pelvis  W &/or Wo  Contrast  Result Date: 04/03/2016 CLINICAL DATA:  70 year old male with abdominal pain. Negative lactic acidosis. Renal failure with peritoneal dialysis catheter. EXAM: CT ANGIOGRAPHY  ABDOMEN AND PELVIS TECHNIQUE: Multidetector CT imaging through the abdomen and pelvis was performed using the standard protocol during bolus administration of intravenous contrast. Delayed images included. Multiplanar reconstructed images including MIPs were obtained and reviewed to evaluate the vascular anatomy. CONTRAST:  100 cc Isovue 370 COMPARISON:  CT angiogram done the previous day, CT 03/28/2016, CT 11/26/2014 FINDINGS: VASCULAR Aorta: Extensive mixed calcified and soft plaque of the lower thoracic and abdominal aorta. No aneurysm. No dissection. No periaortic fluid. Celiac: Similar appearance of atherosclerotic changes at the origin of the celiac artery, which is patent though perhaps with 50% or greater stenosis. Post stenotic dilation. Typical branch pattern again noted, discussed on the prior CT. SMA: Similar stenotic appearance of the superior mesenteric artery with mixed calcified and soft plaque. Greater than 50% stenosis. Distal branches appear patent. Renals: Calcified and soft plaque at the origin the bilateral renal arteries. Single bilateral renal arteries. IMA: Patent IMA. Likely stenosis at the origin. Difficult to quantify degree of stenosis. Right lower extremity: Mixed calcified and soft plaque of the right iliac system. Again, likely greater than 50% stenosis in the proximal right common iliac artery. Hypogastric artery and external iliac artery patent. Atherosclerotic disease throughout the external iliac artery. Common femoral artery in the proximal full more all vessels patent. Left lower extremity: Mixed calcified and soft plaque of the left iliac system. Likely stenosis at the distal common iliac artery. Hypogastric artery and external iliac artery patent. Disease through the length of the external  iliac artery. Common femoral artery in the proximal fill more 0 vasculature patent on today's exam. Veins: Portal venous system remains patent. Unremarkable appearance of the systemic venous vasculature. Review of the MIP images confirms the above findings. NON-VASCULAR Lower chest: Respiratory motion somewhat limits evaluation of the lung bases. Coverage of the lower chest/ field-of-view larger on the current study. Plaque like thickening of the left sided pleura at the lung base. This has been present since the CT 11/26/2014 Coronary calcifications.  Aortic valve calcifications. Hepatobiliary: Morphology of the liver compatible with cirrhosis with enlarged left lobe, caudate lobe, and nodular configuration. No focal lesion identified. Pancreas: Unremarkable appearance of pancreas Spleen: Borderline enlarged spleen  Adrenals/Urinary Tract: Unremarkable appearance of adrenal glands. Right: Severe renal cortical thinning. No hydronephrosis or nephrolithiasis. Hypodense lesion on the lateral cortex and at the superior cortex too small to characterize. Left: Severe renal cortical thinning. No hydronephrosis. Similar appearance of low-density cystic structures, the larger of which are compatible with simple cysts. Smaller are too small to characterize. Excreted contrast within the urinary bladder. Stomach/Bowel: Unremarkable stomach. Small hiatal hernia. No evidence of small bowel obstruction or transition point. Minimal thickening of small bowel wall, persists after the prior CT. Small bowel wall enhances on the arterial and venous phase, with no CT evidence of ischemia. Retained oral contrast within colon, limiting evaluation of the mucosal surface. No evidence of obstruction. Small stool burden. Diverticular disease. Normal appendix. Lymphatic: Low-density ascites within the pericolic gutter, right upper quadrant, and within the dependent pelvis associated with the peritoneal dialysis catheter. No enhancement of the  surface is the peritoneum and. Vascular/lymphatic congestion within small bowel mesenteric, similar to prior. Small lymph nodes, none of which are enlarged. Mesenteric: Fluid throughout the peritoneal of small volume. Small amount of anti dependent gas, similar to prior. Peritoneal dialysis catheter terminates within the low pelvis, adjacent to the rectum. Reproductive: Unremarkable appearance of the pelvic organs. Other: Small fat containing umbilical hernia. Musculoskeletal: Sclerotic appearance of the skeletal elements. No acute bony abnormality. IMPRESSION: Vascular: No acute vascular abnormality. Similar to yesterday's study there is diffuse atherosclerotic changes of the abdominal aorta, with involvement of the mesenteric vasculature. Again, there is likely greater than 50% stenosis at the celiac artery and IMA artery, however, CTA resolution difficult to make precise assessment. The degree of stenosis at the SMA origin is likely high-grade given the post stenotic dilation and degree of disease. Distal branches of the mesenteric vessels remain patent. Bilateral renal artery stenosis. Portal venous system unremarkable. Systemic venous system unremarkable. Nonvascular: Morphology of the liver compatible with cirrhosis. Associated borderline splenomegaly, indicating developing portal hypertension. No evidence of bowel ischemia, with uniform enhancement of stomach and small bowel. Although the colon wall cannot be assessed with the presence of positive enteric contrast, there are no focal findings to suggest colonic ischemia. Temporal resolution of mild small bowel thickening is improved with 24 hour follow-up CT, with mild thickening of the small bowel in the mid abdomen. There is associated lymphatic congestion of the small bowel mesenteric with associated small lymph nodes. Finding is nonspecific. There is no evidence of venous obstruction per se, although these findings can be seen with portal  colopathy/enteropathy in the setting of cirrhosis and developing portal hypertension. Alternative consideration would be an nonspecific inflammatory or infectious enteritis. These results were called by telephone at the time of interpretation on 04/03/2016 at 8:56 am to Dr. Carles Collet, who verbally acknowledged these results. Small volume of low-density ascites, likely related to PD catheter. PD catheter within the dependent pelvis adjacent to rectum. No evidence of peritonitis on the contrast-enhanced exam. Persistent thickening of the left posterior lower pleura, unchanged from the CT 11/26/2014. This may reflect rounded atelectasis/scarring. Further evaluation with chest CT may be considered if the patient has a history of carcinoma. Coronary artery disease.  Aortic valve calcifications. Signed, Dulcy Fanny. Earleen Newport, DO [facility] Electronically Signed   By: Corrie Mckusick D.O.   On: 04/03/2016 08:59   Ct Angio Abd/pel W And/or Wo Contrast  Result Date: 04/01/2016 CLINICAL DATA:  70 year old male with a history of lower abdominal pain and nausea vomiting. Constipation. EXAM: CTA ABDOMEN  AND PELVIS WITH CONTRAST TECHNIQUE: Multidetector CT imaging of the abdomen and pelvis was performed using the standard protocol during bolus administration of intravenous contrast. Multiplanar reconstructed images and MIPs were obtained and reviewed to evaluate the vascular anatomy. CONTRAST:  75 cc Isovue 370 COMPARISON:  CT 03/28/2016, 11/26/2014, 08/06/2013 FINDINGS: VASCULAR Aorta: Diffuse atherosclerosis of the abdominal aorta with soft plaque and calcified plaque. No dissection. No aneurysm. No periaortic fluid. Celiac: Atherosclerotic changes at the origin of the celiac artery which is patent. Post stenotic dilation. Typical branch pattern of the celiac artery, with left gastric, common hepatic, and splenic artery identified. SMA: Atherosclerotic changes of the superior  mesenteric artery, with mixed calcified and soft plaque at the origin. The SMA appears patent, with greater than 50% stenosis. Renals: Dense calcified and soft plaque at the origin the bilateral renal arteries. Renal arteries are patent. Single left and single right renal artery. IMA: Inferior mesenteric artery is patent, though appears stenotic at the origin. Left colic artery patent. Superior rectal artery patent. Right lower extremity: Dense calcified and soft plaque involving the right iliac system, including common iliac artery and external iliac artery. Small caliber vessels, with likely multifocal stenoses. Greatest degree of stenosis may be at the distal common iliac artery above the hypogastric origin. Hypogastric artery is patent. Common femoral artery patent. Proximal SFA and profunda femoris patent. Left lower extremity: Mixed calcified and soft plaque of the left iliac system. Greatest degree of stenosis at the origin of the external iliac artery. Hypogastric artery patent. Common femoral artery patent. Proximal SFA and profunda femoris patent. Veins: Unremarkable appearance of the venous system. Review of the MIP images confirms the above findings. NON-VASCULAR Lower chest: Atelectatic changes at the lung bases. Small hiatal hernia. Hepatobiliary: Morphologic changes of the liver compatible with cirrhosis with developing left hepatic enlargement. Nodular contour. No focal lesion identified on the current CT. No intrahepatic or extrahepatic biliary ductal dilatation. Cholecystectomy Pancreas: Unremarkable appearance of the pancreas. No pericholecystic fluid or inflammatory changes. Unremarkable ductal system. Spleen: Borderline splenomegaly. Adrenals/Urinary Tract: Unremarkable appearance of adrenal glands. Right: No hydronephrosis. Advanced cortical renal thinning. Symmetric perfusion to the left. Hypodense lesions within the cortex of the right kidney, the majority are too small to characterize though  arm most likely benign cysts. Left: No hydronephrosis. Advanced cortical renal thinning come symmetric to the right. Symmetric perfusion. Cystic lesions in the cortex of the left kidney, most likely all benign cysts although the majority are too small to characterize. Unremarkable appearance of the urinary bladder with high density excreted contrast. Stomach/Bowel: Unremarkable appearance of the stomach. Small hiatal hernia. Unremarkable appearance of small bowel. Venous phase demonstrates enhancement of the small bowel and colon, although the colon somewhat limited by the presence of enteric contrast. Colonic diverticula. Lymphatic: Multiple lymph nodes in the para-aortic nodal station, none of which are enlarged. Mesenteric: Low-density free fluid within the abdomen, associated with peritoneal dialysis catheter. Small volume of free air in the anti dependent aspect. Reproductive: Unremarkable appearance of the prostate and pelvic organs. Other: Small fat containing umbilical hernia. Unremarkable appearance of the tract of the dialysis catheter. Musculoskeletal: No evidence of acute fracture. Developing sclerotic appearance of the skeletal structures, potentially early renal osteodystrophy. No bony canal narrowing. No significant degenerative changes of the hips. IMPRESSION: VASCULAR No acute vascular abnormality identified. Advanced aortic atherosclerosis. There are associated changes at the origin of all mesenteric vessel, though they remain patent. There may be 50 percent stenosis at the celiac artery origin,  with greater than 50 percent stenosis of the superior mesenteric artery origin, and potentially greater than 50 percent stenosis at the origin of the IMA, although degree of stenosis cannot be determined by CTA. This pattern of disease certainly places the patient at risk for chronic mesenteric ischemia. Correlation with patient's history may be useful, and potentially mesenteric duplex exam if there is  concern for compromise. Bilateral renal artery stenosis, potentially contributing to advanced renal cortical thinning. NON-VASCULAR Morphologic changes of the liver compatible with cirrhosis. Referral for establishing gastroenterology care may be considered if not already established, as well as consideration of initiating surveillance for hepatocellular carcinoma. Peritoneal dialysis catheter with free fluid likely associated with treatment. Tiny amount of anti dependent gas within the abdomen, again likely associated with the peritoneal dialysis. Again, bilateral renal cortical cysts. While these are most likely benign, there is incomplete characterization given the size. Developing renal osteodystrophy. Signed, Dulcy Fanny. Earleen [place], DO [facility] Radiology Electronically Signed   By: Corrie Mckusick D.O.   On: 04/01/2016 09:00     CBC  Recent Labs Lab 04/04/16 0425 04/03/2016 1623 04/15/2016 0512  WBC 13.9* 10.4 13.7*  HGB 10.2* 8.5* 8.1*  HCT 33.1* 27.8* 26.7*  PLT 304 236 207  MCV 106.8* 104.9* 105.1*  MCH 32.9 32.1 31.9  MCHC 30.8 30.6 30.3  RDW 17.6* 17.5* 17.3*  LYMPHSABS  --  0.7  --   MONOABS  --  0.5  --   EOSABS  --  1.5*  --   BASOSABS  --  0.1  --     Chemistries   Recent Labs Lab 04/04/16 0426 03/28/2016 1623 04/03/2016 2244 04/06/2016 0512  NA 132* 132*  --  132*  K 2.7* 2.7*  --  3.5  CL 92* 93*  --  103  CO2 25 23  --  18*  GLUCOSE 143* 71  --  61*  BUN 32* 35*  --  33*  CREATININE 10.52* 9.50*  --  9.11*  CALCIUM 9.0 8.9  --  7.8*  MG  --   --  1.6*  --   AST  --  62*  --  63*  ALT  --  33  --  31  ALKPHOS  --  77  --  69  BILITOT  --  0.4  --  0.6   ------------------------------------------------------------------------------------------------------------------ estimated creatinine clearance is 8 mL/min (by C-G formula based on SCr of 9.11 mg/dL  (H)). ------------------------------------------------------------------------------------------------------------------ No results for input(s): HGBA1C in the last 72 hours. ------------------------------------------------------------------------------------------------------------------ No results for input(s): CHOL, HDL, LDLCALC, TRIG, CHOLHDL, LDLDIRECT in the last 72 hours. ------------------------------------------------------------------------------------------------------------------ No results for input(s): TSH, T4TOTAL, T3FREE, THYROIDAB in the last 72 hours.  Invalid input(s): FREET3 ------------------------------------------------------------------------------------------------------------------ No results for input(s): VITAMINB12, FOLATE, FERRITIN, TIBC, IRON, RETICCTPCT in the last 72 hours.  Coagulation profile  Recent Labs Lab 03/31/2016 2244  INR 1.34    No results for input(s): DDIMER in the last 72 hours.  Cardiac Enzymes No results for input(s): CKMB, TROPONINI, MYOGLOBIN in the last 168 hours.  Invalid input(s): CK ------------------------------------------------------------------------------------------------------------------ Invalid input(s): POCBNP   CBG:  Recent Labs Lab 04/03/16 2134 04/04/16 0759 04/04/16 1206 03/27/2016 2255 03/22/2016 0800  GLUCAP 134* 161* 116* 71 71       Studies: Dg Chest Port 1 View  Result Date: 04/18/2016 CLINICAL DATA:  Sepsis.  History of head and neck cancer. EXAM: PORTABLE CHEST 1 VIEW COMPARISON:  Chest radiograph April 01, 2016 FINDINGS: Cardiac  silhouette is mildly enlarged unchanged. Heavily calcified aortic. Diffuse interstitial prominence. Worsening retrocardiac consolidation and small LEFT pleural effusion. RIGHT lung base strandy densities. No pneumothorax. Surgical clips LEFT neck, stent projects in RIGHT neck and LEFT subclavian vessels. Postsurgical changes RIGHT distal clavicle. Bibasilar strandy  densities IMPRESSION: Stable cardiomegaly. Worsening retrocardiac consolidation with small LEFT pleural effusion. RIGHT lung base atelectasis/ scarring. Worsening bronchitic changes. Electronically Signed   By: Elon Alas M.D.   On: 04/18/2016 19:40      Lab Results  Component Value Date   HGBA1C 5.5 04/01/2016   HGBA1C 6.3 (H) 08/11/2013   Lab Results  Component Value Date   CREATININE 9.11 (H) 03/31/2016       Scheduled Meds: . aspirin  150 mg Rectal Daily  . famotidine (PEPCID) IV  20 mg Intravenous Q12H  . fluconazole (DIFLUCAN) IV  200 mg Intravenous Q24H  . insulin aspart  0-5 Units Subcutaneous QHS  . insulin aspart  0-9 Units Subcutaneous TID WC  . ipratropium  2 spray Each Nare BID  . mouth rinse  15 mL Mouth Rinse BID  . metoprolol  5 mg Intravenous Q8H  . piperacillin-tazobactam (ZOSYN)  IV  2.25 g Intravenous Q6H  . sodium chloride flush  3 mL Intravenous Q12H   Continuous Infusions: . sodium chloride 75 mL/hr at 03/27/2016 0400     LOS: 1 day    Time spent: >30 MINS    [facility]  [facility] Pager [phone]. If 7PM-7AM, please contact night-coverage at [email or web address], password [facility] 03/30/2016, 8:04 AM  LOS: 1 day

## 2016-04-10 NOTE — Transfer of Care (Signed)
Immediate Anesthesia Transfer of Care Note  Patient: Victor Lane  Procedure(s) Performed: Procedure(s): REMOVAL OF PD CATHETER (N/A)  Patient Location: PACU  Anesthesia Type:General  Level of Consciousness: sedated and patient cooperative  Airway & Oxygen Therapy: Patient Spontanous Breathing and Patient connected to face mask oxygen  Post-op Assessment: Report given to RN and Post -op Vital signs reviewed and stable  Post vital signs: Reviewed and stable  Last Vitals:  Vitals:   04/01/2016 0803 04/16/2016 1319  BP: (!) 129/56 (!) 138/50  Pulse: 95 97  Resp: 19 17  Temp: 36.4 C     Last Pain:  Vitals:   04/07/2016 0803  TempSrc: Oral  PainSc:          Complications: No apparent anesthesia complications

## 2016-04-10 NOTE — Anesthesia Preprocedure Evaluation (Addendum)
Anesthesia Evaluation  Patient identified by MRN, date of birth, ID band Patient awake    Reviewed: Allergy & Precautions, NPO status , Patient's Chart, lab work & pertinent test results, reviewed documented beta blocker date and time   History of Anesthesia Complications (+) history of anesthetic complications (panics waking up)  Airway Mallampati: III   Neck ROM: Full  Mouth opening: Limited Mouth Opening Comment: Previous grade II view with MAC 4. H/o tracheotomy and neck radiation. Decannulated 3 years ago. Dental  (+) Upper Dentures,    Pulmonary sleep apnea (does not use CPAP) , COPD, former smoker,    Pulmonary exam normal breath sounds clear to auscultation       Cardiovascular hypertension, Pt. on medications and Pt. on home beta blockers (-) angina+ CAD, + Cardiac Stents and +CHF  (-) Past MI + dysrhythmias (paroxysmal) Atrial Fibrillation + Valvular Problems/Murmurs AI  Rhythm:Regular Rate:Normal  Dilated aortic root  EKG 03/27/2016: NSR with prolonged PT  TTE 10/08/2014: Study Conclusions  - Left ventricle: The cavity size was normal. Wall thickness was increased in a pattern of mild LVH. Systolic function was moderately to severely reduced. The estimated ejection fraction was in the range of 30% to 35%. - Left atrium: The atrium was mildly dilated. - Pericardium, extracardiac: A small pericardial effusion was identified posterior to the heart. There was a left pleural   effusion.   Neuro/Psych neuropathy    GI/Hepatic GERD  ,(+) Hepatitis - (wife denies)dysphagia   Endo/Other  diabetes, Type 2, Oral Hypoglycemic Agents  Renal/GU ESRF and DialysisRenal disease (last dialyzed last night)     Musculoskeletal  (+) Arthritis , Fibromyalgia -  Abdominal   Peds  Hematology  (+) Blood dyscrasia, anemia ,   Anesthesia Other Findings 70 y.o. 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  Reproductive/Obstetrics                            Anesthesia Physical Anesthesia Plan  ASA: IV  Anesthesia Plan: General   Post-op Pain Management:    Induction: Intravenous  Airway Management Planned: Oral ETT and Video Laryngoscope Planned  Additional Equipment:   Intra-op Plan:   Post-operative Plan: Extubation in OR  Informed Consent: I have reviewed the patients History and Physical, chart, labs and discussed the procedure including the risks, benefits and alternatives for the proposed anesthesia with the patient or authorized representative who has indicated his/her understanding and acceptance.   Dental advisory given  Plan Discussed with: CRNA  Anesthesia Plan Comments: (Risks of general anesthesia discussed including, but not limited to, sore throat, hoarse voice, chipped/damaged teeth, injury to vocal cords, nausea and vomiting, allergic reactions, lung infection, heart attack, stroke, and death. All questions answered. )       Anesthesia Quick Evaluation

## 2016-04-10 NOTE — Progress Notes (Signed)
Verbal orders obtained for Pharmacy dosing of IV Diflucan and for patient to be NPO since midnight.  These orders are already in place.

## 2016-04-10 NOTE — Anesthesia Procedure Notes (Signed)
Procedure Name: Intubation Date/Time: 04/02/2016 11:22 AM Performed by: Lance Coon Pre-anesthesia Checklist: Patient identified, Emergency Drugs available, Suction available, Patient being monitored and Timeout performed Patient Re-evaluated:Patient Re-evaluated prior to inductionOxygen Delivery Method: Circle system utilized Preoxygenation: Pre-oxygenation with 100% oxygen Intubation Type: IV induction Ventilation: Mask ventilation without difficulty Laryngoscope Size: Miller and 2 Grade View: Grade III Tube type: Oral Tube size: 7.5 mm Number of attempts: 1 Airway Equipment and Method: Stylet Placement Confirmation: ETT inserted through vocal cords under direct vision,  positive ETCO2 and breath sounds checked- equal and bilateral Secured at: 21 cm Tube secured with: Tape Dental Injury: Teeth and Oropharynx as per pre-operative assessment

## 2016-04-11 ENCOUNTER — Encounter (HOSPITAL_COMMUNITY): Payer: Self-pay | Admitting: Surgery

## 2016-04-11 LAB — COMPREHENSIVE METABOLIC PANEL
ALK PHOS: 92 U/L (ref 38–126)
ALT: 30 U/L (ref 17–63)
AST: 52 U/L — ABNORMAL HIGH (ref 15–41)
Albumin: 1.4 g/dL — ABNORMAL LOW (ref 3.5–5.0)
Anion gap: 13 (ref 5–15)
BILIRUBIN TOTAL: 0.9 mg/dL (ref 0.3–1.2)
BUN: 42 mg/dL — ABNORMAL HIGH (ref 6–20)
CALCIUM: 7.9 mg/dL — AB (ref 8.9–10.3)
CO2: 18 mmol/L — AB (ref 22–32)
CREATININE: 10.24 mg/dL — AB (ref 0.61–1.24)
Chloride: 104 mmol/L (ref 101–111)
GFR calc non Af Amer: 4 mL/min — ABNORMAL LOW (ref >60)
GFR, EST AFRICAN AMERICAN: 5 mL/min — AB (ref >60)
GLUCOSE: 90 mg/dL (ref 65–99)
Potassium: 3.8 mmol/L (ref 3.5–5.1)
SODIUM: 135 mmol/L (ref 135–145)
TOTAL PROTEIN: 5.4 g/dL — AB (ref 6.5–8.1)

## 2016-04-11 LAB — CBC
HEMATOCRIT: 27.9 % — AB (ref 39.0–52.0)
HEMOGLOBIN: 8.4 g/dL — AB (ref 13.0–17.0)
MCH: 31.6 pg (ref 26.0–34.0)
MCHC: 30.1 g/dL (ref 30.0–36.0)
MCV: 104.9 fL — ABNORMAL HIGH (ref 78.0–100.0)
Platelets: 219 10*3/uL (ref 150–400)
RBC: 2.66 MIL/uL — AB (ref 4.22–5.81)
RDW: 17.4 % — ABNORMAL HIGH (ref 11.5–15.5)
WBC: 12.5 10*3/uL — ABNORMAL HIGH (ref 4.0–10.5)

## 2016-04-11 LAB — GLUCOSE, CAPILLARY
GLUCOSE-CAPILLARY: 112 mg/dL — AB (ref 65–99)
Glucose-Capillary: 82 mg/dL (ref 65–99)
Glucose-Capillary: 88 mg/dL (ref 65–99)
Glucose-Capillary: 94 mg/dL (ref 65–99)

## 2016-04-11 LAB — IRON AND TIBC: Iron: 17 ug/dL — ABNORMAL LOW (ref 45–182)

## 2016-04-11 MED ORDER — HYDROMORPHONE HCL 1 MG/ML IJ SOLN
INTRAMUSCULAR | Status: AC
  Filled 2016-04-11: qty 1

## 2016-04-11 MED ORDER — VANCOMYCIN HCL IN DEXTROSE 1-5 GM/200ML-% IV SOLN
1000.0000 mg | Freq: Once | INTRAVENOUS | Status: AC
  Administered 2016-04-11: 1000 mg via INTRAVENOUS
  Filled 2016-04-11 (×2): qty 200

## 2016-04-11 MED ORDER — DARBEPOETIN ALFA 60 MCG/0.3ML IJ SOSY
60.0000 ug | PREFILLED_SYRINGE | INTRAMUSCULAR | Status: DC
  Administered 2016-04-11 – 2016-04-18 (×2): 60 ug via INTRAVENOUS
  Filled 2016-04-11: qty 0.3

## 2016-04-11 MED ORDER — DARBEPOETIN ALFA 60 MCG/0.3ML IJ SOSY
PREFILLED_SYRINGE | INTRAMUSCULAR | Status: AC
  Filled 2016-04-11: qty 0.3

## 2016-04-11 MED ORDER — PIPERACILLIN-TAZOBACTAM 3.375 G IVPB
3.3750 g | Freq: Two times a day (BID) | INTRAVENOUS | Status: DC
  Filled 2016-04-11 (×2): qty 50

## 2016-04-11 MED ORDER — VANCOMYCIN HCL IN DEXTROSE 1-5 GM/200ML-% IV SOLN: 1000.0000 mg | INTRAVENOUS | Status: DC

## 2016-04-11 MED ORDER — HYDROMORPHONE HCL 2 MG/ML IJ SOLN: 0.2000 mg | INTRAMUSCULAR | Status: DC | PRN

## 2016-04-11 MED ORDER — PIPERACILLIN-TAZOBACTAM 3.375 G IVPB
3.3750 g | Freq: Two times a day (BID) | INTRAVENOUS | Status: DC
Start: 2016-04-11 — End: 2016-04-12
  Administered 2016-04-11 – 2016-04-12 (×3): 3.375 g via INTRAVENOUS
  Filled 2016-04-11 (×4): qty 50

## 2016-04-11 NOTE — Anesthesia Postprocedure Evaluation (Signed)
Anesthesia Post Note  Patient: ATREYA TERHAAR  Procedure(s) Performed: Procedure(s) (LRB): REMOVAL OF PD CATHETER (N/A)  Patient location during evaluation: PACU Anesthesia Type: General Level of consciousness: awake and alert Pain management: pain level controlled Vital Signs Assessment: post-procedure vital signs reviewed and stable Respiratory status: spontaneous breathing, nonlabored ventilation, respiratory function stable and patient connected to nasal cannula oxygen Cardiovascular status: blood pressure returned to baseline and stable Postop Assessment: no signs of nausea or vomiting Anesthetic complications: no     Last Vitals:  Vitals:   04/11/16 0300 04/11/16 0700  BP: 137/74 (!) 125/48  Pulse: (!) 111 (!) 109  Resp: (!) 24 (!) 24  Temp: 37.1 C 36.6 C    Last Pain:  Vitals:   04/11/16 0700  TempSrc: Oral  PainSc: 0-No pain   Pain Goal:                 Catalina Gravel

## 2016-04-11 NOTE — Progress Notes (Signed)
[facility] PROGRESS NOTE  Victor Lane [id number] DOB: [birth year] DOA: 04/13/2016   PCP: [name], MD     Assessment/Plan: Principal Problem:   Peritonitis ([place]) Active Problems:   HTN (hypertension)   Anemia of chronic disease   Continuous ambulatory peritoneal dialysis status ([place])   ESRD on dialysis ([place])   Dilated aortic root (HCC)   CAD (coronary artery disease), native coronary artery   Dyslipidemia   Chronic systolic congestive heart failure (HCC)   Carotid stenosis   Abdominal pain   Diabetes mellitus with complication (HCC)   Hypokalemia   Acute encephalopathy   70 y.o. 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  He presents to ED sent by renal MD for PD cath removal for refractory culture negative peritonitis. Per chart last ECHO shows EF 30%. Hypotensive on arrival. Possible small left lower lobe infiltrate.    Assessment and plan  Peritonitis ([place]) and sepsis: his fluid cultures have all been negative, but has high cell count in fluid, indicating persistent or recurrent peritonitis. Pt is suspected to have sepsis on presentation with elevated lactate, tachycardia, tachypnea and hypotension. Blood pressure responded to IV fluid resuscitation, currently 105/95. Nephrology, Dr. gross was consulted.   Status pos removal of PD cath    looking better already sp cath removal yest. Appreciate gen surg assistance   transfer  To   telemetry - cont IV vancomycin and Zosyn pending  Culture results  continue Diflucan per Dr. Jonnie Finner recommendation    Acute encephalopathy: Most likely due to sepsis. -Hold oral medications -Continue our check -Treat underlying infection as above  ESRD-on PD:  - hold off on PD for now per Dr. Jonnie Finner,    transitioning PD to HD,AVF  functional L arm. HD today  Hypokalemia: K= 2.7     on admission. - Repleted with KCl 10 mEQ x 6   Mg level> 1.5  CAD: s/p of stent. No CP -continue aspirin per rectal, IV metoprolol  HTN: -hold oral meds -IV hydralazine when necessary and IV metoprolol with holding parameters.  Chronic systolic congestive heart failure: 2-D echo on 10/08/14 showed EF 30-35 percent. Patient does not have leg edema or JVD. CHF seems to be compensated on admission. Pt is not on diuretics at home. -Watch volume status closely  DM-II: Last A1c 5.5, well controled. Patient is taking glipizide at home -SSI     DVT prophylaxsis heparin for surgery?  Code Status:  Full code    Family Communication: Discussed in detail with the patient's wife , all imaging results, lab results explained to the patient   Disposition  tx to tele     Consultants:  General surgery  Nephrology  Procedures:  None  Antibiotics: Anti-infectives    Start     Dose/Rate Route Frequency Ordered Stop   04/11/16 1000  piperacillin-tazobactam (ZOSYN) IVPB 3.375 g     3.375 g 12.5 mL/hr over 240 Minutes Intravenous Every 12 hours 04/11/16 0353     03/29/2016 2300  fluconazole (DIFLUCAN) IVPB 200 mg     200 mg 100 mL/hr over 60 Minutes Intravenous Every 24 hours 04/10/2016 2232     04/04/2016 2200  piperacillin-tazobactam (ZOSYN) IVPB 2.25 g  Status:  Discontinued  2.25 g 100 mL/hr over 30 Minutes Intravenous Every 6 hours 04/02/2016 1622 04/11/16 0353   04/16/2016 1615  piperacillin-tazobactam (ZOSYN) IVPB 3.375 g     3.375 g 100 mL/hr over 30 Minutes Intravenous  Once 03/29/2016 1609 04/14/2016 1756   04/21/2016 1615  vancomycin (VANCOCIN) 1,500 mg in sodium chloride 0.9 % 500 mL IVPB     1,500 mg 250 mL/hr over 120 Minutes Intravenous  Once 04/06/2016 1609 04/14/2016 2140         HPI/Subjective: is more alert and responding better. BP's stable  Objective: Vitals:   04/11/16 0928 04/11/16 0956 04/11/16 1030  04/11/16 1100  BP: (!) 117/57 (!) 119/57 115/61 (!) 114/49  Pulse: (!) 111 (!) 109 (!) 113 (!) 115  Resp: (!) 23 19 18 18   Temp:      TempSrc:      SpO2:      Weight:      Height:        Intake/Output Summary (Last 24 hours) at 04/11/16 1135 Last data filed at 04/11/16 0033  Gross per 24 hour  Intake              450 ml  Output               10 ml  Net              440 ml    Exam:  Examination:  General exam: Confused  Respiratory system: Clear to auscultation. Respiratory effort normal. Cardiovascular system: S1 & S2 heard, RRR. No JVD, murmurs, rubs, gallops or clicks. No pedal edema. Gastrointestinal system: Abdomen is nondistended, soft and nontender. No organomegaly or masses felt. Normal bowel sounds heard. Central nervous system:  . No focal neurological deficits. Extremities: Symmetric 5 x 5 power. Skin: No rashes, lesions or ulcers Psychiatry: Judgement and insight appear normal. Mood & affect appropriate.     Data Reviewed: I have personally reviewed following labs and imaging studies  Micro Results Recent Results (from the past 240 hour(s))  MRSA PCR Screening     Status: None   Collection Time: 04/01/16  5:49 PM  Result Value Ref Range Status   MRSA by PCR NEGATIVE NEGATIVE Final    Comment:        The GeneXpert MRSA Assay (FDA approved for NASAL specimens only), is one component of a comprehensive MRSA colonization surveillance program. It is not intended to diagnose MRSA infection nor to guide or monitor treatment for MRSA infections.   C difficile quick scan w PCR reflex     Status: None   Collection Time: 04/03/2016  4:07 PM  Result Value Ref Range Status   C Diff antigen NEGATIVE NEGATIVE Final   C Diff toxin NEGATIVE NEGATIVE Final   C Diff interpretation No C. difficile detected.  Final  Gastrointestinal Panel by PCR , Stool     Status: None   Collection Time: 04/08/2016  4:07 PM  Result Value Ref Range Status   Campylobacter species NOT  DETECTED NOT DETECTED Final   Plesimonas shigelloides NOT DETECTED NOT DETECTED Final   Salmonella species NOT DETECTED NOT DETECTED Final   Yersinia enterocolitica NOT DETECTED NOT DETECTED Final   Vibrio species NOT DETECTED NOT DETECTED Final   Vibrio cholerae NOT DETECTED NOT DETECTED Final   Enteroaggregative E coli (EAEC) NOT DETECTED NOT DETECTED Final   Enteropathogenic E coli (EPEC) NOT DETECTED NOT DETECTED Final   Enterotoxigenic E coli (ETEC) NOT DETECTED NOT DETECTED Final  Shiga like toxin producing E coli (STEC) NOT DETECTED NOT DETECTED Final   Shigella/Enteroinvasive E coli (EIEC) NOT DETECTED NOT DETECTED Final   Cryptosporidium NOT DETECTED NOT DETECTED Final   Cyclospora cayetanensis NOT DETECTED NOT DETECTED Final   Entamoeba histolytica NOT DETECTED NOT DETECTED Final   Giardia lamblia NOT DETECTED NOT DETECTED Final   Adenovirus F40/41 NOT DETECTED NOT DETECTED Final   Astrovirus NOT DETECTED NOT DETECTED Final   Norovirus GI/GII NOT DETECTED NOT DETECTED Final   Rotavirus A NOT DETECTED NOT DETECTED Final   Sapovirus (I, II, IV, and V) NOT DETECTED NOT DETECTED Final  Blood Culture (routine x 2)     Status: None (Preliminary result)   Collection Time: 04/02/2016  4:17 PM  Result Value Ref Range Status   Specimen Description BLOOD RIGHT HAND  Final   Special Requests BOTTLES DRAWN AEROBIC AND ANAEROBIC 5CC  Final   Culture NO GROWTH < 24 HOURS  Final   Report Status PENDING  Incomplete  Blood Culture (routine x 2)     Status: None (Preliminary result)   Collection Time: 03/23/2016  4:23 PM  Result Value Ref Range Status   Specimen Description BLOOD LEFT ANTECUBITAL  Final   Special Requests BOTTLES DRAWN AEROBIC AND ANAEROBIC 5CC  Final   Culture NO GROWTH < 24 HOURS  Final   Report Status PENDING  Incomplete  MRSA PCR Screening     Status: None   Collection Time: 04/15/2016 10:29 AM  Result Value Ref Range Status   MRSA by PCR NEGATIVE NEGATIVE Final     Comment:        The GeneXpert MRSA Assay (FDA approved for NASAL specimens only), is one component of a comprehensive MRSA colonization surveillance program. It is not intended to diagnose MRSA infection nor to guide or monitor treatment for MRSA infections.     Radiology Reports Dg Chest 2 View  Result Date: 04/01/2016 CLINICAL DATA:  Suspected sepsis.  Peritoneal dialysis patient. EXAM: CHEST  2 VIEW COMPARISON:  Chest radiograph 07/19/2015.  Abdominal CT 03/28/2016 FINDINGS: Stable cardiomegaly and mediastinal contours. There is thoracic aortic atherosclerosis. Small bilateral pleural effusions and bibasilar airspace disease. No pulmonary edema. No pneumothorax. Vascular stent in the left axilla. Free air under the right hemidiaphragm, likely related to peritoneal dialysis. Resorption of the distal right clavicle. IMPRESSION: Chronic cardiomegaly, stable.  Thoracic aortic atherosclerosis. Small bilateral pleural effusions with bibasilar airspace disease, likely atelectasis. Electronically Signed   By: Jeb Levering M.D.   On: 04/01/2016 05:40   Ct Abdomen Pelvis W Contrast  Result Date: 03/29/2016 CLINICAL DATA:  Acute onset of generalized abdominal pain. Recent peritoneal dialysis. Vomiting and diarrhea. Initial encounter. EXAM: CT ABDOMEN AND PELVIS WITH CONTRAST TECHNIQUE: Multidetector CT imaging of the abdomen and pelvis was performed using the standard protocol following bolus administration of intravenous contrast. CONTRAST:  166mL ISOVUE-300 IOPAMIDOL (ISOVUE-300) INJECTION 61% COMPARISON:  CT of the abdomen and pelvis from 11/26/2014 FINDINGS: Lower chest: Trace right-sided pleural fluid is noted. Bibasilar atelectasis or scarring is seen. The visualized portions of the mediastinum are unremarkable. Hepatobiliary: A small amount of ascites is seen tracking about the liver. The liver is unremarkable in appearance. The patient is status post cholecystectomy, with clips noted at the  gallbladder fossa. The common bile duct remains normal in caliber. Pancreas: The pancreas is within normal limits. Spleen: The spleen is unremarkable in appearance. Adrenals/Urinary Tract: The adrenal glands are grossly unremarkable in appearance. Severe chronic bilateral renal atrophy  is noted, with scattered bilateral renal cysts. There is no evidence of hydronephrosis. No renal or ureteral stones are identified. No significant perinephric stranding is seen. Stomach/Bowel: The stomach is unremarkable in appearance. The small bowel is within normal limits. The appendix is normal in caliber, without evidence of appendicitis. Scattered diverticulosis is noted along the distal descending and sigmoid colon, without definite evidence of diverticulitis. Vascular/Lymphatic: Diffuse calcification is seen along the abdominal aorta and its branches. There is relatively severe luminal narrowing at the proximal right common iliac artery, and moderate luminal narrowing along the left common iliac artery. Relatively severe luminal narrowing is also noted along the proximal left superficial femoral artery. No retroperitoneal or pelvic sidewall lymphadenopathy is seen. Reproductive: The bladder is mildly distended and grossly unremarkable. The prostate remains normal in size. Other: The patient's peritoneal dialysis catheter is noted within the pelvis, with a small to moderate amount of free fluid in the pelvis and along both paracolic gutters. Musculoskeletal: No acute osseous abnormalities are identified. The visualized musculature is unremarkable in appearance. IMPRESSION: 1. No acute abnormality seen to explain the patient's symptoms. 2. Status post recent peritoneal dialysis. Small to moderate volume ascites noted within the abdomen and pelvis. 3. Diffuse aortic atherosclerosis noted. Relatively severe luminal narrowing at the proximal right common iliac artery, and moderate luminal narrowing along the left common iliac  artery. Relatively severe luminal narrowing also noted along the proximal left superficial femoral artery. 4. Severe chronic bilateral renal atrophy, with scattered bilateral renal cysts. 5. Scattered diverticulosis along the distal descending and sigmoid colon, without definite evidence of diverticulitis. 6. Trace right-sided pleural fluid. Bibasilar atelectasis or scarring noted. Electronically Signed   By: Garald Balding M.D.   On: 03/29/2016 00:10   Dg Chest Port 1 View  Result Date: 04/18/2016 CLINICAL DATA:  Sepsis.  History of head and neck cancer. EXAM: PORTABLE CHEST 1 VIEW COMPARISON:  Chest radiograph April 01, 2016 FINDINGS: Cardiac silhouette is mildly enlarged unchanged. Heavily calcified aortic. Diffuse interstitial prominence. Worsening retrocardiac consolidation and small LEFT pleural effusion. RIGHT lung base strandy densities. No pneumothorax. Surgical clips LEFT neck, stent projects in RIGHT neck and LEFT subclavian vessels. Postsurgical changes RIGHT distal clavicle. Bibasilar strandy densities IMPRESSION: Stable cardiomegaly. Worsening retrocardiac consolidation with small LEFT pleural effusion. RIGHT lung base atelectasis/ scarring. Worsening bronchitic changes. Electronically Signed   By: Elon Alas M.D.   On: 03/31/2016 19:40   Ct Angio Abdomen Pelvis  W &/or Wo Contrast  Result Date: 04/03/2016 CLINICAL DATA:  70 year old male with abdominal pain. Negative lactic acidosis. Renal failure with peritoneal dialysis catheter. EXAM: CT ANGIOGRAPHY  ABDOMEN AND PELVIS TECHNIQUE: Multidetector CT imaging through the abdomen and pelvis was performed using the standard protocol during bolus administration of intravenous contrast. Delayed images included. Multiplanar reconstructed images including MIPs were obtained and reviewed to evaluate the vascular anatomy. CONTRAST:  100 cc Isovue 370 COMPARISON:  CT angiogram done the previous day, CT 03/28/2016, CT 11/26/2014 FINDINGS:  VASCULAR Aorta: Extensive mixed calcified and soft plaque of the lower thoracic and abdominal aorta. No aneurysm. No dissection. No periaortic fluid. Celiac: Similar appearance of atherosclerotic changes at the origin of the celiac artery, which is patent though perhaps with 50% or greater stenosis. Post stenotic dilation. Typical branch pattern again noted, discussed on the prior CT. SMA: Similar stenotic appearance of the superior mesenteric artery with mixed calcified and soft plaque. Greater than 50% stenosis. Distal branches appear patent. Renals: Calcified and soft plaque at the origin  the bilateral renal arteries. Single bilateral renal arteries. IMA: Patent IMA. Likely stenosis at the origin. Difficult to quantify degree of stenosis. Right lower extremity: Mixed calcified and soft plaque of the right iliac system. Again, likely greater than 50% stenosis in the proximal right common iliac artery. Hypogastric artery and external iliac artery patent. Atherosclerotic disease throughout the external iliac artery. Common femoral artery in the proximal full more all vessels patent. Left lower extremity: Mixed calcified and soft plaque of the left iliac system. Likely stenosis at the distal common iliac artery. Hypogastric artery and external iliac artery patent. Disease through the length of the external iliac artery. Common femoral artery in the proximal fill more 0 vasculature patent on today's exam. Veins: Portal venous system remains patent. Unremarkable appearance of the systemic venous vasculature. Review of the MIP images confirms the above findings. NON-VASCULAR Lower chest: Respiratory motion somewhat limits evaluation of the lung bases. Coverage of the lower chest/ field-of-view larger on the current study. Plaque like thickening of the left sided pleura at the lung base. This has been present since the CT 11/26/2014 Coronary calcifications.  Aortic valve calcifications. Hepatobiliary: Morphology of the  liver compatible with cirrhosis with enlarged left lobe, caudate lobe, and nodular configuration. No focal lesion identified. Pancreas: Unremarkable appearance of pancreas Spleen: Borderline enlarged spleen Adrenals/Urinary Tract: Unremarkable appearance of adrenal glands. Right: Severe renal cortical thinning. No hydronephrosis or nephrolithiasis. Hypodense lesion on the lateral cortex and at the superior cortex too small to characterize. Left: Severe renal cortical thinning. No hydronephrosis. Similar appearance of low-density cystic structures, the larger of which are compatible with simple cysts. Smaller are too small to characterize. Excreted contrast within the urinary bladder. Stomach/Bowel: Unremarkable stomach. Small hiatal hernia. No evidence of small bowel obstruction or transition point. Minimal thickening of small bowel wall, persists after the prior CT. Small bowel wall enhances on the arterial and venous phase, with no CT evidence of ischemia. Retained oral contrast within colon, limiting evaluation of the mucosal surface. No evidence of obstruction. Small stool burden. Diverticular disease. Normal appendix. Lymphatic: Low-density ascites within the pericolic gutter, right upper quadrant, and within the dependent pelvis associated with the peritoneal dialysis catheter. No enhancement of the surface is the peritoneum and. Vascular/lymphatic congestion within small bowel mesenteric, similar to prior. Small lymph nodes, none of which are enlarged. Mesenteric: Fluid throughout the peritoneal of small volume. Small amount of anti dependent gas, similar to prior. Peritoneal dialysis catheter terminates within the low pelvis, adjacent to the rectum. Reproductive: Unremarkable appearance of the pelvic organs. Other: Small fat containing umbilical hernia. Musculoskeletal: Sclerotic appearance of the skeletal elements. No acute bony abnormality. IMPRESSION: Vascular: No acute vascular abnormality. Similar to  yesterday's study there is diffuse atherosclerotic changes of the abdominal aorta, with involvement of the mesenteric vasculature. Again, there is likely greater than 50% stenosis at the celiac artery and IMA artery, however, CTA resolution difficult to make precise assessment. The degree of stenosis at the SMA origin is likely high-grade given the post stenotic dilation and degree of disease. Distal branches of the mesenteric vessels remain patent. Bilateral renal artery stenosis. Portal venous system unremarkable. Systemic venous system unremarkable. Nonvascular: Morphology of the liver compatible with cirrhosis. Associated borderline splenomegaly, indicating developing portal hypertension. No evidence of bowel ischemia, with uniform enhancement of stomach and small bowel. Although the colon wall cannot be assessed with the presence of positive enteric contrast, there are no focal findings to suggest colonic ischemia. Temporal resolution of mild small  bowel thickening is improved with 24 hour follow-up CT, with mild thickening of the small bowel in the mid abdomen. There is associated lymphatic congestion of the small bowel mesenteric with associated small lymph nodes. Finding is nonspecific. There is no evidence of venous obstruction per se, although these findings can be seen with portal colopathy/enteropathy in the setting of cirrhosis and developing portal hypertension. Alternative consideration would be an nonspecific inflammatory or infectious enteritis. These results were called by telephone at the time of interpretation on 04/03/2016 at 8:56 am to Dr. Carles Collet, who verbally acknowledged these results. Small volume of low-density ascites, likely related to PD catheter. PD catheter within the dependent pelvis adjacent to rectum. No evidence of peritonitis on the contrast-enhanced exam. Persistent thickening of the left posterior lower pleura, unchanged from the CT 11/26/2014. This may reflect rounded  atelectasis/scarring. Further evaluation with chest CT may be considered if the patient has a history of carcinoma. Coronary artery disease.  Aortic valve calcifications. Signed, Dulcy Fanny. Earleen Newport, DO [facility] Radiology Electronically Signed   By: Corrie Mckusick D.O.   On: 04/03/2016 08:59   Ct Angio Abd/pel W And/or Wo Contrast  Result Date: 04/01/2016 CLINICAL DATA:  70 year old male with a history of lower abdominal pain and nausea vomiting. Constipation. EXAM: CTA ABDOMEN AND PELVIS WITH CONTRAST TECHNIQUE: Multidetector CT imaging of the abdomen and pelvis was performed using the standard protocol during bolus administration of intravenous contrast. Multiplanar reconstructed images and MIPs were obtained and reviewed to evaluate the vascular anatomy. CONTRAST:  75 cc Isovue 370 COMPARISON:  CT 03/28/2016, 11/26/2014, 08/06/2013 FINDINGS: VASCULAR Aorta: Diffuse atherosclerosis of the abdominal aorta with soft plaque and calcified plaque. No dissection. No aneurysm. No periaortic fluid. Celiac: Atherosclerotic changes at the origin of the celiac artery which is patent. Post stenotic dilation. Typical branch pattern of the celiac artery, with left gastric, common hepatic, and splenic artery identified. SMA: Atherosclerotic changes of the superior mesenteric artery, with mixed calcified and soft plaque at the origin. The SMA appears patent, with greater than 50% stenosis. Renals: Dense calcified and soft plaque at the origin the bilateral renal arteries. Renal arteries are patent. Single left and single right renal artery. IMA: Inferior mesenteric artery is patent, though appears stenotic at the origin. Left colic artery patent. Superior rectal artery patent. Right lower extremity: Dense calcified and soft plaque involving the right iliac system, including common iliac artery and external iliac artery. Small caliber vessels, with likely multifocal stenoses.  Greatest degree of stenosis may be at the distal common iliac artery above the hypogastric origin. Hypogastric artery is patent. Common femoral artery patent. Proximal SFA and profunda femoris patent. Left lower extremity: Mixed calcified and soft plaque of the left iliac system. Greatest degree of stenosis at the origin of the external iliac artery. Hypogastric artery patent. Common femoral artery patent. Proximal SFA and profunda femoris patent. Veins: Unremarkable appearance of the venous system. Review of the MIP images confirms the above findings. NON-VASCULAR Lower chest: Atelectatic changes at the lung bases. Small hiatal hernia. Hepatobiliary: Morphologic changes of the liver compatible with cirrhosis with developing left hepatic enlargement. Nodular contour. No focal lesion identified on the current CT. No intrahepatic or extrahepatic biliary ductal dilatation. Cholecystectomy Pancreas: Unremarkable appearance of the pancreas. No pericholecystic fluid or inflammatory changes. Unremarkable ductal system. Spleen: Borderline splenomegaly. Adrenals/Urinary Tract: Unremarkable appearance of adrenal glands. Right: No hydronephrosis. Advanced cortical renal thinning. Symmetric perfusion to the left. Hypodense lesions within the cortex  of the right kidney, the majority are too small to characterize though arm most likely benign cysts. Left: No hydronephrosis. Advanced cortical renal thinning come symmetric to the right. Symmetric perfusion. Cystic lesions in the cortex of the left kidney, most likely all benign cysts although the majority are too small to characterize. Unremarkable appearance of the urinary bladder with high density excreted contrast. Stomach/Bowel: Unremarkable appearance of the stomach. Small hiatal hernia. Unremarkable appearance of small bowel. Venous phase demonstrates enhancement of the small bowel and colon, although the colon somewhat limited by the presence of enteric contrast. Colonic  diverticula. Lymphatic: Multiple lymph nodes in the para-aortic nodal station, none of which are enlarged. Mesenteric: Low-density free fluid within the abdomen, associated with peritoneal dialysis catheter. Small volume of free air in the anti dependent aspect. Reproductive: Unremarkable appearance of the prostate and pelvic organs. Other: Small fat containing umbilical hernia. Unremarkable appearance of the tract of the dialysis catheter. Musculoskeletal: No evidence of acute fracture. Developing sclerotic appearance of the skeletal structures, potentially early renal osteodystrophy. No bony canal narrowing. No significant degenerative changes of the hips. IMPRESSION: VASCULAR No acute vascular abnormality identified. Advanced aortic atherosclerosis. There are associated changes at the origin of all mesenteric vessel, though they remain patent. There may be 50 percent stenosis at the celiac artery origin, with greater than 50 percent stenosis of the superior mesenteric artery origin, and potentially greater than 50 percent stenosis at the origin of the IMA, although degree of stenosis cannot be determined by CTA. This pattern of disease certainly places the patient at risk for chronic mesenteric ischemia. Correlation with patient's history may be useful, and potentially mesenteric duplex exam if there is concern for compromise. Bilateral renal artery stenosis, potentially contributing to advanced renal cortical thinning. NON-VASCULAR Morphologic changes of the liver compatible with cirrhosis. Referral for establishing gastroenterology care may be considered if not already established, as well as consideration of initiating surveillance for hepatocellular carcinoma. Peritoneal dialysis catheter with free fluid likely associated with treatment. Tiny amount of anti dependent gas within the abdomen, again likely associated with the peritoneal dialysis. Again, bilateral renal cortical cysts. While these are most likely  benign, there is incomplete characterization given the size. Developing renal osteodystrophy. Signed, Dulcy Fanny. Earleen Newport, DO [facility] Electronically Signed   By: Corrie Mckusick D.O.   On: 04/01/2016 09:00     CBC  Recent Labs Lab 04/17/2016 1623 04/14/2016 0512 04/11/16 0343  WBC 10.4 13.7* 12.5*  HGB 8.5* 8.1* 8.4*  HCT 27.8* 26.7* 27.9*  PLT 236 207 219  MCV 104.9* 105.1* 104.9*  MCH 32.1 31.9 31.6  MCHC 30.6 30.3 30.1  RDW 17.5* 17.3* 17.4*  LYMPHSABS 0.7  --   --   MONOABS 0.5  --   --   EOSABS 1.5*  --   --   BASOSABS 0.1  --   --     Chemistries   Recent Labs Lab 04/02/2016 1623 03/31/2016 2244 03/23/2016 0512 04/11/16 0343  NA 132*  --  132* 135  K 2.7*  --  3.5 3.8  CL 93*  --  103 104  CO2 23  --  18* 18*  GLUCOSE 71  --  61* 90  BUN 35*  --  33* 42*  CREATININE 9.50*  --  9.11* 10.24*  CALCIUM 8.9  --  7.8* 7.9*  MG  --  1.6*  --   --   AST 62*  --  63* 52*  ALT 33  --  31 30  ALKPHOS 77  --  69 92  BILITOT 0.4  --  0.6 0.9   ------------------------------------------------------------------------------------------------------------------ estimated creatinine clearance is 7.1 mL/min (by C-G formula based on SCr of 10.24 mg/dL (H)). ------------------------------------------------------------------------------------------------------------------ No results for input(s): HGBA1C in the last 72 hours. ------------------------------------------------------------------------------------------------------------------ No results for input(s): CHOL, HDL, LDLCALC, TRIG, CHOLHDL, LDLDIRECT in the last 72 hours. ------------------------------------------------------------------------------------------------------------------ No results for input(s): TSH, T4TOTAL, T3FREE, THYROIDAB in the last 72 hours.  Invalid input(s):  FREET3 ------------------------------------------------------------------------------------------------------------------ No results for input(s): VITAMINB12, FOLATE, FERRITIN, TIBC, IRON, RETICCTPCT in the last 72 hours.  Coagulation profile  Recent Labs Lab 03/22/2016 2244  INR 1.34    No results for input(s): DDIMER in the last 72 hours.  Cardiac Enzymes No results for input(s): CKMB, TROPONINI, MYOGLOBIN in the last 168 hours.  Invalid input(s): CK ------------------------------------------------------------------------------------------------------------------ Invalid input(s): POCBNP   CBG:  Recent Labs Lab 04/12/2016 0800 04/12/2016 1018 04/18/2016 1737 04/06/2016 2116 04/11/16 0810  GLUCAP 71 78 78 77 82       Studies: Dg Chest Port 1 View  Result Date: 04/08/2016 CLINICAL DATA:  Sepsis.  History of head and neck cancer. EXAM: PORTABLE CHEST 1 VIEW COMPARISON:  Chest radiograph April 01, 2016 FINDINGS: Cardiac silhouette is mildly enlarged unchanged. Heavily calcified aortic. Diffuse interstitial prominence. Worsening retrocardiac consolidation and small LEFT pleural effusion. RIGHT lung base strandy densities. No pneumothorax. Surgical clips LEFT neck, stent projects in RIGHT neck and LEFT subclavian vessels. Postsurgical changes RIGHT distal clavicle. Bibasilar strandy densities IMPRESSION: Stable cardiomegaly. Worsening retrocardiac consolidation with small LEFT pleural effusion. RIGHT lung base atelectasis/ scarring. Worsening bronchitic changes. Electronically Signed   By: Elon Alas M.D.   On: 03/23/2016 19:40      Lab Results  Component Value Date   HGBA1C 5.5 04/01/2016   HGBA1C 6.3 (H) 08/11/2013   Lab Results  Component Value Date   CREATININE 10.24 (H) 04/11/2016       Scheduled Meds: . aspirin  150 mg Rectal Daily  . darbepoetin (ARANESP) injection - DIALYSIS  60 mcg Intravenous Q Sat-HD  . famotidine (PEPCID) IV  20 mg Intravenous Q12H   . fluconazole (DIFLUCAN) IV  200 mg Intravenous Q24H  . insulin aspart  0-5 Units Subcutaneous QHS  . insulin aspart  0-9 Units Subcutaneous TID WC  . ipratropium  2 spray Each Nare BID  . mouth rinse  15 mL Mouth Rinse BID  . piperacillin-tazobactam (ZOSYN)  IV  3.375 g Intravenous Q12H  . sodium chloride flush  3 mL Intravenous Q12H   Continuous Infusions:     LOS: 2 days    Time spent: >30 MINS    [facility]  [facility] Pager [phone]. If 7PM-7AM, please contact night-coverage at [email or web address], password [facility] 04/11/2016, 11:35 AM  LOS: 2 days

## 2016-04-11 NOTE — Progress Notes (Addendum)
Pharmacy Antibiotic Note  Victor Lane is a 70 y.o. male admitted on 03/27/2016 with intra-abdominal infection.  Pharmacy has been consulted for zosyn, vancomycin, and fluconazole dosing. CC peritonitis. Treated and discharged 10/11 at [facility]. Patient has ESRD and is transitioning from CAPD to HD.  First HD session was today and patient tolerated 3.5hr at BFR 350. He is currently afebrile with a WBC count of 12.5 k/uL. He was loaded with vancomycin on admission and has been getting zosyn and fluconazole.   Plan: Monitor new HD schedule (TTS for now)  Vancomycin 1g IV TTS w/HD Zosyn 3.375 g IV q 12h (4-hour infusion)  Fluconazole 200 IV q24h Monitor clinical progress, ABx LOT   Height: 5\' 11"  (180.3 cm) Weight: 180 lb 12.4 oz (82 kg) (Bed Scale) IBW/kg (Calculated) : 75.3  Temp (24hrs), Avg:98.2 F (36.8 C), Min:97.4 F (36.3 C), Max:99.3 F (37.4 C)   Recent Labs Lab 04/03/2016 1623 03/24/2016 1639 04/12/2016 2006 03/23/2016 2244 04/06/2016 0056 04/01/2016 0512 04/11/16 0343  WBC 10.4  --   --   --   --  13.7* 12.5*  CREATININE 9.50*  --   --   --   --  9.11* 10.24*  LATICACIDVEN  --  3.66* 1.41 1.4 1.1  --   --     Estimated Creatinine Clearance: 7.1 mL/min (by C-G formula based on SCr of 10.24 mg/dL (H)).   ESRD  Allergies  Allergen Reactions  . Fentanyl Other (See Comments)    Confusion when patch applied  . Nsaids Other (See Comments)    Severe kidney disease/ESRD  . Amoxicillin Diarrhea and Nausea And Vomiting  . Moxifloxacin Diarrhea and Nausea And Vomiting    Antimicrobials this admission:  Zosyn 10/19 >. Vanc 10/19 >>  Dose adjustments this admission:  N/A  Microbiology results:  10/19 BCx: ngtd  10/19 UCx: ngtd 10/19 GI PCR: neg  10/19 C Diff: neg  10/20 MRSA PCR: neg  Thank you for allowing pharmacy to be a part of this patient's care.  Demetrius Charity, PharmD Acute Care Pharmacy Resident  Pager: [phone] 04/11/2016

## 2016-04-11 NOTE — Progress Notes (Signed)
Pt to 6N31 via bed, wife at bedside. Oriented to dept and room.  Bed alarm on and camera. Verified with wife OK to put camera on pt. She agreed.

## 2016-04-11 NOTE — Progress Notes (Addendum)
  [facility] Progress Note   Subjective: is more alert and responding better. BP's stable.    Vitals:   04/11/16 0845 04/11/16 0900 04/11/16 0928 04/11/16 0956  BP: 105/89 128/60 (!) 117/57 (!) 119/57  Pulse: (!) 106 (!) 110 (!) 111 (!) 109  Resp: 17 (!) 23 (!) 23 19  Temp:      TempSrc:      SpO2:      Weight:      Height:        Inpatient medications: . aspirin  150 mg Rectal Daily  . famotidine (PEPCID) IV  20 mg Intravenous Q12H  . fluconazole (DIFLUCAN) IV  200 mg Intravenous Q24H  . insulin aspart  0-5 Units Subcutaneous QHS  . insulin aspart  0-9 Units Subcutaneous TID WC  . ipratropium  2 spray Each Nare BID  . mouth rinse  15 mL Mouth Rinse BID  . piperacillin-tazobactam (ZOSYN)  IV  3.375 g Intravenous Q12H  . sodium chloride flush  3 mL Intravenous Q12H     sodium chloride, sodium chloride, alteplase, heparin, HYDROmorphone (DILAUDID) injection, lidocaine (PF), lidocaine-prilocaine, morphine injection, mupirocin ointment, ondansetron, pentafluoroprop-tetrafluoroeth, triamcinolone cream  Exam: Gen more awake and responsive today, "2018" No jvd or bruits Chest soft bibasilar rales, no wheezing, WOB is normal RRR 2/6 sem no RG Abd soft ntnd no mass or ascites +bs  PD cath in LUQ clean exit site GU normal male MS no joint effusions or deformity Ext no LE or UE edema / no wounds or ulcers Neuro is somnolent, arousable, confused, moving all ext    Dialysis: PD diccontinued due to refractory infection  Assessment: 1. Refractory peritonitis, culture negative - TNC 3000-6000 in spite of IP then IV abx, confused, etc > looking better already sp cath removal yest. Appreciate gen surg assistance.  IP and outpt cx's pending.  2. Hypotension - resolved 3. Vol - got sig IVF on admission for low BP's, try to pull some off w HD today, stable resp status 4. ESRD - transitioning PD to HD, has AVF functional L arm. HD today.  5. Hypokalemia -  better 6. CAD hx stent x 1 7. CM EF 30% - stable 8. Anemia / CKD - Hb 8's, check Fe, start darbe 60/wk   Plan - as above   Kelly Splinter MD [facility] pager [phone]   04/11/2016, 10:01 AM    Recent Labs Lab 03/23/2016 1623 03/28/2016 0512 04/11/16 0343  NA 132* 132* 135  K 2.7* 3.5 3.8  CL 93* 103 104  CO2 23 18* 18*  GLUCOSE 71 61* 90  BUN 35* 33* 42*  CREATININE 9.50* 9.11* 10.24*  CALCIUM 8.9 7.8* 7.9*    Recent Labs Lab 04/17/2016 1623 04/06/2016 0512 04/11/16 0343  AST 62* 63* 52*  ALT 33 31 30  ALKPHOS 77 69 92  BILITOT 0.4 0.6 0.9  PROT 5.1* 4.8* 5.4*  ALBUMIN 1.7* 1.5* 1.4*    Recent Labs Lab 03/30/2016 1623 03/25/2016 0512 04/11/16 0343  WBC 10.4 13.7* 12.5*  NEUTROABS 7.6  --   --   HGB 8.5* 8.1* 8.4*  HCT 27.8* 26.7* 27.9*  MCV 104.9* 105.1* 104.9*  PLT 236 207 219   Iron/TIBC/Ferritin/ %Sat    Component Value Date/Time   IRON 17 (L) 02/10/2013 1326   TIBC 160 (L) 02/10/2013 1326   FERRITIN >16,500 (H) 05/23/2013 1235   IRONPCTSAT 11 (L) 02/10/2013 1326

## 2016-04-11 NOTE — Progress Notes (Signed)
Patient ID: Victor Lane, male   DOB: [birth year], 70 y.o.   MRN: [id number] POD 1 pd catheter removal Doing ok, hd today Dressings dry and clean Will remove tomorrow

## 2016-04-12 ENCOUNTER — Inpatient Hospital Stay (HOSPITAL_COMMUNITY): Payer: Medicare Other

## 2016-04-12 LAB — AMMONIA: AMMONIA: 20 umol/L (ref 9–35)

## 2016-04-12 LAB — GLUCOSE, CAPILLARY: GLUCOSE-CAPILLARY: 126 mg/dL — AB (ref 65–99)

## 2016-04-12 MED ORDER — GLIPIZIDE 2.5 MG HALF TABLET
2.5000 mg | ORAL_TABLET | Freq: Every day | ORAL | Status: DC
  Administered 2016-04-13 – 2016-04-15 (×3): 2.5 mg via ORAL
  Filled 2016-04-12 (×5): qty 1

## 2016-04-12 MED ORDER — ASPIRIN EC 81 MG PO TBEC
81.0000 mg | DELAYED_RELEASE_TABLET | Freq: Every day | ORAL | Status: DC
  Administered 2016-04-13 – 2016-04-19 (×5): 81 mg via ORAL
  Filled 2016-04-12 (×7): qty 1

## 2016-04-12 MED ORDER — FAMOTIDINE IN NACL 20-0.9 MG/50ML-% IV SOLN
20.0000 mg | INTRAVENOUS | Status: DC
  Filled 2016-04-12: qty 50

## 2016-04-12 MED ORDER — NEPRO/CARBSTEADY PO LIQD
237.0000 mL | Freq: Two times a day (BID) | ORAL | Status: DC
  Administered 2016-04-13 – 2016-04-18 (×4): 237 mL via ORAL
  Filled 2016-04-12 (×15): qty 237

## 2016-04-12 MED ORDER — HEPARIN SODIUM (PORCINE) 5000 UNIT/ML IJ SOLN
5000.0000 [IU] | Freq: Three times a day (TID) | INTRAMUSCULAR | Status: DC
  Administered 2016-04-12 – 2016-04-14 (×4): 5000 [IU] via SUBCUTANEOUS
  Filled 2016-04-12 (×3): qty 1

## 2016-04-12 MED ORDER — RENA-VITE PO TABS
1.0000 | ORAL_TABLET | Freq: Every day | ORAL | Status: DC
  Administered 2016-04-15 – 2016-04-19 (×4): 1 via ORAL
  Filled 2016-04-12 (×8): qty 1

## 2016-04-12 MED ORDER — MORPHINE SULFATE (PF) 2 MG/ML IV SOLN
1.0000 mg | Freq: Four times a day (QID) | INTRAVENOUS | Status: DC | PRN
  Administered 2016-04-13: 1 mg via INTRAVENOUS
  Filled 2016-04-12: qty 1

## 2016-04-12 NOTE — Progress Notes (Signed)
SLP Cancellation Note  Patient Details Name: Victor Lane MRN: [id number] DOB: 01/10/1946   Cancelled treatment:       Reason Eval/Treat Not Completed: Patient at procedure or test/unavailable. Waited for pt until 1450. Not back from testing yet. Will f/u for swallow eval tomorrow.    Victor Lane, Katherene Ponto 04/12/2016, 2:51 PM

## 2016-04-12 NOTE — Progress Notes (Signed)
In talking with the wife, she states pt had dental work done (bone grafting left lower) this past summer, was to have an implant done Dr. Edsel Petrin this fall.  She states he was on some clindamycin with the dental work.  Assisted pt to the chair earlier today and he keeps closing his eyes and leans backwards. Tolerated in the chair for 1 1/2 hours.  When asked if he was in pain, he states "no, just achey."  Called Speech therapy about swallow evaluation and they will be up this afternoon to do evaluation following xrays at 1:00 pm.

## 2016-04-12 NOTE — Progress Notes (Signed)
2 Days Post-Op  Subjective: comfortable  Objective: Vital signs in last 24 hours: Temp:  [97.4 F (36.3 C)-99.6 F (37.6 C)] 98.6 F (37 C) (10/22 0553) Pulse Rate:  [106-115] 111 (10/22 0553) Resp:  [16-23] 16 (10/22 0553) BP: (96-151)/(49-95) 151/70 (10/22 0553) SpO2:  [92 %-100 %] 99 % (10/22 0553) Weight:  [82 kg (180 lb 12.4 oz)-83.5 kg (184 lb 1.4 oz)] 82 kg (180 lb 12.4 oz) (10/21 1216) Last BM Date: 04/08/2016  Intake/Output from previous day: 10/21 0701 - 10/22 0700 In: 170 [P.O.:120; IV Piggyback:50] Out: 1500  Intake/Output this shift: No intake/output data recorded.  Exam: Abdomen soft, incisions clean  Lab Results:   Recent Labs  04/18/2016 0512 04/11/16 0343  WBC 13.7* 12.5*  HGB 8.1* 8.4*  HCT 26.7* 27.9*  PLT 207 219   BMET  Recent Labs  04/16/2016 0512 04/11/16 0343  NA 132* 135  K 3.5 3.8  CL 103 104  CO2 18* 18*  GLUCOSE 61* 90  BUN 33* 42*  CREATININE 9.11* 10.24*  CALCIUM 7.8* 7.9*   PT/INR  Recent Labs  04/10/2016 2244  LABPROT 16.7*  INR 1.34   ABG No results for input(s): PHART, HCO3 in the last 72 hours.  Invalid input(s): PCO2, PO2  Studies/Results: No results found.  Anti-infectives: Anti-infectives    Start     Dose/Rate Route Frequency Ordered Stop   04/14/16 1200  vancomycin (VANCOCIN) IVPB 1000 mg/200 mL premix     1,000 mg 200 mL/hr over 60 Minutes Intravenous Every T-Th-Sa (Hemodialysis) 04/11/16 1447     04/11/16 1800  vancomycin (VANCOCIN) IVPB 1000 mg/200 mL premix     1,000 mg 200 mL/hr over 60 Minutes Intravenous  Once 04/11/16 1447 04/11/16 2022   04/11/16 1330  piperacillin-tazobactam (ZOSYN) IVPB 3.375 g     3.375 g 12.5 mL/hr over 240 Minutes Intravenous Every 12 hours 04/11/16 1315     04/11/16 1000  piperacillin-tazobactam (ZOSYN) IVPB 3.375 g  Status:  Discontinued     3.375 g 12.5 mL/hr over 240 Minutes Intravenous Every 12 hours 04/11/16 0353 04/11/16 1315   04/07/2016 2300  fluconazole (DIFLUCAN)  IVPB 200 mg     200 mg 100 mL/hr over 60 Minutes Intravenous Every 24 hours 04/17/2016 2232     04/08/2016 2200  piperacillin-tazobactam (ZOSYN) IVPB 2.25 g  Status:  Discontinued     2.25 g 100 mL/hr over 30 Minutes Intravenous Every 6 hours 04/13/2016 1622 04/11/16 0353   04/04/2016 1615  piperacillin-tazobactam (ZOSYN) IVPB 3.375 g     3.375 g 100 mL/hr over 30 Minutes Intravenous  Once 04/10/2016 1609 04/07/2016 1756   04/11/2016 1615  vancomycin (VANCOCIN) 1,500 mg in sodium chloride 0.9 % 500 mL IVPB     1,500 mg 250 mL/hr over 120 Minutes Intravenous  Once 04/15/2016 1609 04/18/2016 2140      Assessment/Plan: s/p Procedure(s): REMOVAL OF PD CATHETER (N/A)  Wounds stable  Will sign off.  Will need follow up with [facility] for staple removal next week  LOS: 3 days    Victor Lane A 04/12/2016

## 2016-04-12 NOTE — Progress Notes (Signed)
[facility] PROGRESS NOTE  KASI DAIN [id number] DOB: 08/04/1945 DOA: 04/01/2016   PCP: [name], MD     Assessment/Plan: Principal Problem:   Peritonitis ([place]) Active Problems:   HTN (hypertension)   Anemia of chronic disease   Continuous ambulatory peritoneal dialysis status ([place])   ESRD on dialysis ([place])   Dilated aortic root (HCC)   CAD (coronary artery disease), native coronary artery   Dyslipidemia   Chronic systolic congestive heart failure (HCC)   Carotid stenosis   Abdominal pain   Diabetes mellitus with complication (HCC)   Hypokalemia   Acute encephalopathy   70 y.o. 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  He presents to ED sent by renal MD for PD cath removal for refractory culture negative peritonitis. Per chart last ECHO shows EF 30%. Hypotensive on arrival. Possible small left lower lobe infiltrate.    Assessment and plan  Peritonitis ([place]) and sepsis:  his ascites fluid cultures from 10/11 was negative, but has high cell count in fluid, indicating persistent or recurrent peritonitis. Pt was suspected to have sepsis on presentation with elevated lactate, tachycardia, tachypnea and hypotension. Blood pressure responded to IV fluid resuscitation,    Nephrology, Roney Jaffe, MD  was consulted.   Status pos removal of PD cath by general surgery    Hemodynamics have improved Currently on vancomycin, Zosyn,   with dialysis plus Diflucan  Duration of antibiotics -probably will need 2 weeks of treatment    Acute encephalopathy: Most likely due to sepsis. Resolved Continue to treat underlying infection   ESRD-on PD: None hemodialysis   per Dr. Jonnie Finner,   Transitioned PD to HD,AVF functional L arm. HD as per nephrology recommendations    Hypokalemia: K= 2.7, resolved  Check daily labs  CAD: s/p of stent. No CP -continue aspirin per rectal, IV metoprolol  HTN: -hold oral meds -IV hydralazine when necessary and IV metoprolol with holding parameters.  Chronic systolic congestive heart failure: 2-D echo on 10/08/14 showed EF 30-35 percent. Patient does not have leg edema or JVD. CHF seems to be compensated on admission. Pt is not on diuretics at home. Volume status being managed with hemodialysis -Watch volume status closely  DM-II: Last A1c 5.5, well controled. Patient is taking glipizide at home CBGs have remained stable, will DC Accu-Cheks   Back pain-patient's wife states that patient had a recent fall Since then his low back has been working Plain films ordered  DVT prophylaxsis heparin  Code Status:  Full code    Family Communication: Discussed in detail with the patient's wife , all imaging results, lab results explained to the patient   Disposition  PT OT evaluation     Consultants:  General surgery  Nephrology  Procedures:  None  Antibiotics: Anti-infectives    Start     Dose/Rate Route Frequency Ordered Stop   04/14/16 1200  vancomycin (VANCOCIN) IVPB 1000 mg/200 mL premix     1,000 mg 200 mL/hr over 60 Minutes Intravenous Every T-Th-Sa (Hemodialysis) 04/11/16 1447     04/11/16 1800  vancomycin (VANCOCIN) IVPB 1000 mg/200 mL premix     1,000 mg 200 mL/hr over 60 Minutes Intravenous  Once 04/11/16 1447 04/11/16 2022   04/11/16 1330  piperacillin-tazobactam (ZOSYN) IVPB 3.375 g     3.375 g 12.5 mL/hr over 240 Minutes Intravenous Every 12 hours 04/11/16 1315     04/11/16 1000  piperacillin-tazobactam (ZOSYN) IVPB 3.375 g  Status:  Discontinued     3.375 g 12.5 mL/hr over 240 Minutes Intravenous Every 12 hours 04/11/16 0353 04/11/16 1315   04/07/2016 2300  fluconazole (DIFLUCAN) IVPB 200 mg     200 mg 100 mL/hr over 60 Minutes Intravenous Every 24 hours 03/29/2016 2232     04/06/2016  2200  piperacillin-tazobactam (ZOSYN) IVPB 2.25 g  Status:  Discontinued     2.25 g 100 mL/hr over 30 Minutes Intravenous Every 6 hours 04/06/2016 1622 04/11/16 0353   04/21/2016 1615  piperacillin-tazobactam (ZOSYN) IVPB 3.375 g     3.375 g 100 mL/hr over 30 Minutes Intravenous  Once 03/26/2016 1609 03/31/2016 1756   04/14/2016 1615  vancomycin (VANCOCIN) 1,500 mg in sodium chloride 0.9 % 500 mL IVPB     1,500 mg 250 mL/hr over 120 Minutes Intravenous  Once 04/05/2016 1609 04/17/2016 2140         HPI/Subjective: Wife is by the bedside, patient is complaining of lower back pain  Objective: Vitals:   04/11/16 1614 04/11/16 1700 04/11/16 2051 04/12/16 0553  BP: (!) 135/58 131/72 139/60 (!) 151/70  Pulse: (!) 112 (!) 110 (!) 106 (!) 111  Resp: 17 17 17 16   Temp: 99.6 F (37.6 C) 97.9 F (36.6 C) 98.6 F (37 C) 98.6 F (37 C)  TempSrc: Oral Oral Oral   SpO2: 94% 92% 97% 99%  Weight:      Height:        Intake/Output Summary (Last 24 hours) at 04/12/16 [id number] Last data filed at 04/12/16 0553  Gross per 24 hour  Intake              170 ml  Output             1500 ml  Net            -1330 ml    Exam:  Examination:  General exam: Confused  Respiratory system: Clear to auscultation. Respiratory effort normal. Cardiovascular system: S1 & S2 heard, RRR. No JVD, murmurs, rubs, gallops or clicks. No pedal edema. Gastrointestinal system: Abdomen is nondistended, soft and nontender. No organomegaly or masses felt. Normal bowel sounds heard. Central nervous system:  . No focal neurological deficits. Extremities: Symmetric 5 x 5 power. Skin: No rashes, lesions or ulcers Psychiatry: Judgement and insight appear normal. Mood & affect appropriate.     Data Reviewed: I have personally reviewed following labs and imaging studies  Micro Results Recent Results (from the past 240 hour(s))  C difficile quick scan w PCR reflex     Status: None   Collection Time: 04/05/2016  4:07 PM  Result Value  Ref Range Status   C Diff antigen NEGATIVE NEGATIVE Final   C Diff toxin NEGATIVE NEGATIVE Final   C Diff interpretation No C. difficile detected.  Final  Gastrointestinal Panel by PCR , Stool     Status: None   Collection Time: 04/05/2016  4:07 PM  Result Value Ref Range Status   Campylobacter species NOT DETECTED NOT DETECTED Final   Plesimonas shigelloides NOT DETECTED NOT DETECTED Final   Salmonella species NOT DETECTED NOT DETECTED Final   Yersinia enterocolitica NOT DETECTED NOT DETECTED Final   Vibrio species NOT DETECTED NOT DETECTED Final   Vibrio cholerae NOT DETECTED NOT DETECTED Final  Enteroaggregative E coli (EAEC) NOT DETECTED NOT DETECTED Final   Enteropathogenic E coli (EPEC) NOT DETECTED NOT DETECTED Final   Enterotoxigenic E coli (ETEC) NOT DETECTED NOT DETECTED Final   Shiga like toxin producing E coli (STEC) NOT DETECTED NOT DETECTED Final   Shigella/Enteroinvasive E coli (EIEC) NOT DETECTED NOT DETECTED Final   Cryptosporidium NOT DETECTED NOT DETECTED Final   Cyclospora cayetanensis NOT DETECTED NOT DETECTED Final   Entamoeba histolytica NOT DETECTED NOT DETECTED Final   Giardia lamblia NOT DETECTED NOT DETECTED Final   Adenovirus F40/41 NOT DETECTED NOT DETECTED Final   Astrovirus NOT DETECTED NOT DETECTED Final   Norovirus GI/GII NOT DETECTED NOT DETECTED Final   Rotavirus A NOT DETECTED NOT DETECTED Final   Sapovirus (I, II, IV, and V) NOT DETECTED NOT DETECTED Final  Blood Culture (routine x 2)     Status: None (Preliminary result)   Collection Time: 03/23/2016  4:17 PM  Result Value Ref Range Status   Specimen Description BLOOD RIGHT HAND  Final   Special Requests BOTTLES DRAWN AEROBIC AND ANAEROBIC 5CC  Final   Culture NO GROWTH 2 DAYS  Final   Report Status PENDING  Incomplete  Blood Culture (routine x 2)     Status: None (Preliminary result)   Collection Time: 04/04/2016  4:23 PM  Result Value Ref Range Status   Specimen Description BLOOD LEFT  ANTECUBITAL  Final   Special Requests BOTTLES DRAWN AEROBIC AND ANAEROBIC 5CC  Final   Culture NO GROWTH 2 DAYS  Final   Report Status PENDING  Incomplete  MRSA PCR Screening     Status: None   Collection Time: 04/18/2016 10:29 AM  Result Value Ref Range Status   MRSA by PCR NEGATIVE NEGATIVE Final    Comment:        The GeneXpert MRSA Assay (FDA approved for NASAL specimens only), is one component of a comprehensive MRSA colonization surveillance program. It is not intended to diagnose MRSA infection nor to guide or monitor treatment for MRSA infections.     Radiology Reports Dg Chest 2 View  Result Date: 04/01/2016 CLINICAL DATA:  Suspected sepsis.  Peritoneal dialysis patient. EXAM: CHEST  2 VIEW COMPARISON:  Chest radiograph 07/19/2015.  Abdominal CT 03/28/2016 FINDINGS: Stable cardiomegaly and mediastinal contours. There is thoracic aortic atherosclerosis. Small bilateral pleural effusions and bibasilar airspace disease. No pulmonary edema. No pneumothorax. Vascular stent in the left axilla. Free air under the right hemidiaphragm, likely related to peritoneal dialysis. Resorption of the distal right clavicle. IMPRESSION: Chronic cardiomegaly, stable.  Thoracic aortic atherosclerosis. Small bilateral pleural effusions with bibasilar airspace disease, likely atelectasis. Electronically Signed   By: Jeb Levering M.D.   On: 04/01/2016 05:40   Ct Abdomen Pelvis W Contrast  Result Date: 03/29/2016 CLINICAL DATA:  Acute onset of generalized abdominal pain. Recent peritoneal dialysis. Vomiting and diarrhea. Initial encounter. EXAM: CT ABDOMEN AND PELVIS WITH CONTRAST TECHNIQUE: Multidetector CT imaging of the abdomen and pelvis was performed using the standard protocol following bolus administration of intravenous contrast. CONTRAST:  139mL ISOVUE-300 IOPAMIDOL (ISOVUE-300) INJECTION 61% COMPARISON:  CT of the abdomen and pelvis from 11/26/2014 FINDINGS: Lower chest: Trace right-sided  pleural fluid is noted. Bibasilar atelectasis or scarring is seen. The visualized portions of the mediastinum are unremarkable. Hepatobiliary: A small amount of ascites is seen tracking about the liver. The liver is unremarkable in appearance. The patient is status post cholecystectomy, with clips noted at the gallbladder fossa. The common bile duct remains normal  in caliber. Pancreas: The pancreas is within normal limits. Spleen: The spleen is unremarkable in appearance. Adrenals/Urinary Tract: The adrenal glands are grossly unremarkable in appearance. Severe chronic bilateral renal atrophy is noted, with scattered bilateral renal cysts. There is no evidence of hydronephrosis. No renal or ureteral stones are identified. No significant perinephric stranding is seen. Stomach/Bowel: The stomach is unremarkable in appearance. The small bowel is within normal limits. The appendix is normal in caliber, without evidence of appendicitis. Scattered diverticulosis is noted along the distal descending and sigmoid colon, without definite evidence of diverticulitis. Vascular/Lymphatic: Diffuse calcification is seen along the abdominal aorta and its branches. There is relatively severe luminal narrowing at the proximal right common iliac artery, and moderate luminal narrowing along the left common iliac artery. Relatively severe luminal narrowing is also noted along the proximal left superficial femoral artery. No retroperitoneal or pelvic sidewall lymphadenopathy is seen. Reproductive: The bladder is mildly distended and grossly unremarkable. The prostate remains normal in size. Other: The patient's peritoneal dialysis catheter is noted within the pelvis, with a small to moderate amount of free fluid in the pelvis and along both paracolic gutters. Musculoskeletal: No acute osseous abnormalities are identified. The visualized musculature is unremarkable in appearance. IMPRESSION: 1. No acute abnormality seen to explain the  patient's symptoms. 2. Status post recent peritoneal dialysis. Small to moderate volume ascites noted within the abdomen and pelvis. 3. Diffuse aortic atherosclerosis noted. Relatively severe luminal narrowing at the proximal right common iliac artery, and moderate luminal narrowing along the left common iliac artery. Relatively severe luminal narrowing also noted along the proximal left superficial femoral artery. 4. Severe chronic bilateral renal atrophy, with scattered bilateral renal cysts. 5. Scattered diverticulosis along the distal descending and sigmoid colon, without definite evidence of diverticulitis. 6. Trace right-sided pleural fluid. Bibasilar atelectasis or scarring noted. Electronically Signed   By: Garald Balding M.D.   On: 03/29/2016 00:10   Dg Chest Port 1 View  Result Date: 03/22/2016 CLINICAL DATA:  Sepsis.  History of head and neck cancer. EXAM: PORTABLE CHEST 1 VIEW COMPARISON:  Chest radiograph April 01, 2016 FINDINGS: Cardiac silhouette is mildly enlarged unchanged. Heavily calcified aortic. Diffuse interstitial prominence. Worsening retrocardiac consolidation and small LEFT pleural effusion. RIGHT lung base strandy densities. No pneumothorax. Surgical clips LEFT neck, stent projects in RIGHT neck and LEFT subclavian vessels. Postsurgical changes RIGHT distal clavicle. Bibasilar strandy densities IMPRESSION: Stable cardiomegaly. Worsening retrocardiac consolidation with small LEFT pleural effusion. RIGHT lung base atelectasis/ scarring. Worsening bronchitic changes. Electronically Signed   By: Elon Alas M.D.   On: 04/21/2016 19:40   Ct Angio Abdomen Pelvis  W &/or Wo Contrast  Result Date: 04/03/2016 CLINICAL DATA:  70 year old male with abdominal pain. Negative lactic acidosis. Renal failure with peritoneal dialysis catheter. EXAM: CT ANGIOGRAPHY  ABDOMEN AND PELVIS TECHNIQUE: Multidetector CT imaging through the abdomen and pelvis was performed using the standard  protocol during bolus administration of intravenous contrast. Delayed images included. Multiplanar reconstructed images including MIPs were obtained and reviewed to evaluate the vascular anatomy. CONTRAST:  100 cc Isovue 370 COMPARISON:  CT angiogram done the previous day, CT 03/28/2016, CT 11/26/2014 FINDINGS: VASCULAR Aorta: Extensive mixed calcified and soft plaque of the lower thoracic and abdominal aorta. No aneurysm. No dissection. No periaortic fluid. Celiac: Similar appearance of atherosclerotic changes at the origin of the celiac artery, which is patent though perhaps with 50% or greater stenosis. Post stenotic dilation. Typical branch pattern again noted, discussed on the prior CT.  SMA: Similar stenotic appearance of the superior mesenteric artery with mixed calcified and soft plaque. Greater than 50% stenosis. Distal branches appear patent. Renals: Calcified and soft plaque at the origin the bilateral renal arteries. Single bilateral renal arteries. IMA: Patent IMA. Likely stenosis at the origin. Difficult to quantify degree of stenosis. Right lower extremity: Mixed calcified and soft plaque of the right iliac system. Again, likely greater than 50% stenosis in the proximal right common iliac artery. Hypogastric artery and external iliac artery patent. Atherosclerotic disease throughout the external iliac artery. Common femoral artery in the proximal full more all vessels patent. Left lower extremity: Mixed calcified and soft plaque of the left iliac system. Likely stenosis at the distal common iliac artery. Hypogastric artery and external iliac artery patent. Disease through the length of the external iliac artery. Common femoral artery in the proximal fill more 0 vasculature patent on today's exam. Veins: Portal venous system remains patent. Unremarkable appearance of the systemic venous vasculature. Review of the MIP images confirms the above findings. NON-VASCULAR Lower chest: Respiratory motion  somewhat limits evaluation of the lung bases. Coverage of the lower chest/ field-of-view larger on the current study. Plaque like thickening of the left sided pleura at the lung base. This has been present since the CT 11/26/2014 Coronary calcifications.  Aortic valve calcifications. Hepatobiliary: Morphology of the liver compatible with cirrhosis with enlarged left lobe, caudate lobe, and nodular configuration. No focal lesion identified. Pancreas: Unremarkable appearance of pancreas Spleen: Borderline enlarged spleen Adrenals/Urinary Tract: Unremarkable appearance of adrenal glands. Right: Severe renal cortical thinning. No hydronephrosis or nephrolithiasis. Hypodense lesion on the lateral cortex and at the superior cortex too small to characterize. Left: Severe renal cortical thinning. No hydronephrosis. Similar appearance of low-density cystic structures, the larger of which are compatible with simple cysts. Smaller are too small to characterize. Excreted contrast within the urinary bladder. Stomach/Bowel: Unremarkable stomach. Small hiatal hernia. No evidence of small bowel obstruction or transition point. Minimal thickening of small bowel wall, persists after the prior CT. Small bowel wall enhances on the arterial and venous phase, with no CT evidence of ischemia. Retained oral contrast within colon, limiting evaluation of the mucosal surface. No evidence of obstruction. Small stool burden. Diverticular disease. Normal appendix. Lymphatic: Low-density ascites within the pericolic gutter, right upper quadrant, and within the dependent pelvis associated with the peritoneal dialysis catheter. No enhancement of the surface is the peritoneum and. Vascular/lymphatic congestion within small bowel mesenteric, similar to prior. Small lymph nodes, none of which are enlarged. Mesenteric: Fluid throughout the peritoneal of small volume. Small amount of anti dependent gas, similar to prior. Peritoneal dialysis catheter  terminates within the low pelvis, adjacent to the rectum. Reproductive: Unremarkable appearance of the pelvic organs. Other: Small fat containing umbilical hernia. Musculoskeletal: Sclerotic appearance of the skeletal elements. No acute bony abnormality. IMPRESSION: Vascular: No acute vascular abnormality. Similar to yesterday's study there is diffuse atherosclerotic changes of the abdominal aorta, with involvement of the mesenteric vasculature. Again, there is likely greater than 50% stenosis at the celiac artery and IMA artery, however, CTA resolution difficult to make precise assessment. The degree of stenosis at the SMA origin is likely high-grade given the post stenotic dilation and degree of disease. Distal branches of the mesenteric vessels remain patent. Bilateral renal artery stenosis. Portal venous system unremarkable. Systemic venous system unremarkable. Nonvascular: Morphology of the liver compatible with cirrhosis. Associated borderline splenomegaly, indicating developing portal hypertension. No evidence of bowel ischemia, with uniform enhancement of stomach  and small bowel. Although the colon wall cannot be assessed with the presence of positive enteric contrast, there are no focal findings to suggest colonic ischemia. Temporal resolution of mild small bowel thickening is improved with 24 hour follow-up CT, with mild thickening of the small bowel in the mid abdomen. There is associated lymphatic congestion of the small bowel mesenteric with associated small lymph nodes. Finding is nonspecific. There is no evidence of venous obstruction per se, although these findings can be seen with portal colopathy/enteropathy in the setting of cirrhosis and developing portal hypertension. Alternative consideration would be an nonspecific inflammatory or infectious enteritis. These results were called by telephone at the time of interpretation on 04/03/2016 at 8:56 am to Dr. Carles Collet, who verbally acknowledged these  results. Small volume of low-density ascites, likely related to PD catheter. PD catheter within the dependent pelvis adjacent to rectum. No evidence of peritonitis on the contrast-enhanced exam. Persistent thickening of the left posterior lower pleura, unchanged from the CT 11/26/2014. This may reflect rounded atelectasis/scarring. Further evaluation with chest CT may be considered if the patient has a history of carcinoma. Coronary artery disease.  Aortic valve calcifications. Signed, Dulcy Fanny. Earleen Newport, DO [facility] Radiology Electronically Signed   By: Corrie Mckusick D.O.   On: 04/03/2016 08:59   Ct Angio Abd/pel W And/or Wo Contrast  Result Date: 04/01/2016 CLINICAL DATA:  70 year old male with a history of lower abdominal pain and nausea vomiting. Constipation. EXAM: CTA ABDOMEN AND PELVIS WITH CONTRAST TECHNIQUE: Multidetector CT imaging of the abdomen and pelvis was performed using the standard protocol during bolus administration of intravenous contrast. Multiplanar reconstructed images and MIPs were obtained and reviewed to evaluate the vascular anatomy. CONTRAST:  75 cc Isovue 370 COMPARISON:  CT 03/28/2016, 11/26/2014, 08/06/2013 FINDINGS: VASCULAR Aorta: Diffuse atherosclerosis of the abdominal aorta with soft plaque and calcified plaque. No dissection. No aneurysm. No periaortic fluid. Celiac: Atherosclerotic changes at the origin of the celiac artery which is patent. Post stenotic dilation. Typical branch pattern of the celiac artery, with left gastric, common hepatic, and splenic artery identified. SMA: Atherosclerotic changes of the superior mesenteric artery, with mixed calcified and soft plaque at the origin. The SMA appears patent, with greater than 50% stenosis. Renals: Dense calcified and soft plaque at the origin the bilateral renal arteries. Renal arteries are patent. Single left and single right renal artery. IMA: Inferior mesenteric artery  is patent, though appears stenotic at the origin. Left colic artery patent. Superior rectal artery patent. Right lower extremity: Dense calcified and soft plaque involving the right iliac system, including common iliac artery and external iliac artery. Small caliber vessels, with likely multifocal stenoses. Greatest degree of stenosis may be at the distal common iliac artery above the hypogastric origin. Hypogastric artery is patent. Common femoral artery patent. Proximal SFA and profunda femoris patent. Left lower extremity: Mixed calcified and soft plaque of the left iliac system. Greatest degree of stenosis at the origin of the external iliac artery. Hypogastric artery patent. Common femoral artery patent. Proximal SFA and profunda femoris patent. Veins: Unremarkable appearance of the venous system. Review of the MIP images confirms the above findings. NON-VASCULAR Lower chest: Atelectatic changes at the lung bases. Small hiatal hernia. Hepatobiliary: Morphologic changes of the liver compatible with cirrhosis with developing left hepatic enlargement. Nodular contour. No focal lesion identified on the current CT. No intrahepatic or extrahepatic biliary ductal dilatation. Cholecystectomy Pancreas: Unremarkable appearance of the pancreas. No pericholecystic fluid or inflammatory  changes. Unremarkable ductal system. Spleen: Borderline splenomegaly. Adrenals/Urinary Tract: Unremarkable appearance of adrenal glands. Right: No hydronephrosis. Advanced cortical renal thinning. Symmetric perfusion to the left. Hypodense lesions within the cortex of the right kidney, the majority are too small to characterize though arm most likely benign cysts. Left: No hydronephrosis. Advanced cortical renal thinning come symmetric to the right. Symmetric perfusion. Cystic lesions in the cortex of the left kidney, most likely all benign cysts although the majority are too small to characterize. Unremarkable appearance of the urinary  bladder with high density excreted contrast. Stomach/Bowel: Unremarkable appearance of the stomach. Small hiatal hernia. Unremarkable appearance of small bowel. Venous phase demonstrates enhancement of the small bowel and colon, although the colon somewhat limited by the presence of enteric contrast. Colonic diverticula. Lymphatic: Multiple lymph nodes in the para-aortic nodal station, none of which are enlarged. Mesenteric: Low-density free fluid within the abdomen, associated with peritoneal dialysis catheter. Small volume of free air in the anti dependent aspect. Reproductive: Unremarkable appearance of the prostate and pelvic organs. Other: Small fat containing umbilical hernia. Unremarkable appearance of the tract of the dialysis catheter. Musculoskeletal: No evidence of acute fracture. Developing sclerotic appearance of the skeletal structures, potentially early renal osteodystrophy. No bony canal narrowing. No significant degenerative changes of the hips. IMPRESSION: VASCULAR No acute vascular abnormality identified. Advanced aortic atherosclerosis. There are associated changes at the origin of all mesenteric vessel, though they remain patent. There may be 50 percent stenosis at the celiac artery origin, with greater than 50 percent stenosis of the superior mesenteric artery origin, and potentially greater than 50 percent stenosis at the origin of the IMA, although degree of stenosis cannot be determined by CTA. This pattern of disease certainly places the patient at risk for chronic mesenteric ischemia. Correlation with patient's history may be useful, and potentially mesenteric duplex exam if there is concern for compromise. Bilateral renal artery stenosis, potentially contributing to advanced renal cortical thinning. NON-VASCULAR Morphologic changes of the liver compatible with cirrhosis. Referral for establishing gastroenterology care may be considered if not already established, as well as consideration  of initiating surveillance for hepatocellular carcinoma. Peritoneal dialysis catheter with free fluid likely associated with treatment. Tiny amount of anti dependent gas within the abdomen, again likely associated with the peritoneal dialysis. Again, bilateral renal cortical cysts. While these are most likely benign, there is incomplete characterization given the size. Developing renal osteodystrophy. Signed, Dulcy Fanny. Earleen Newport, DO [facility] Electronically Signed   By: Corrie Mckusick D.O.   On: 04/01/2016 09:00     CBC  Recent Labs Lab 04/08/2016 1623 03/30/2016 0512 04/11/16 0343  WBC 10.4 13.7* 12.5*  HGB 8.5* 8.1* 8.4*  HCT 27.8* 26.7* 27.9*  PLT 236 207 219  MCV 104.9* 105.1* 104.9*  MCH 32.1 31.9 31.6  MCHC 30.6 30.3 30.1  RDW 17.5* 17.3* 17.4*  LYMPHSABS 0.7  --   --   MONOABS 0.5  --   --   EOSABS 1.5*  --   --   BASOSABS 0.1  --   --     Chemistries   Recent Labs Lab 04/14/2016 1623 04/15/2016 2244 04/03/2016 0512 04/11/16 0343  NA 132*  --  132* 135  K 2.7*  --  3.5 3.8  CL 93*  --  103 104  CO2 23  --  18* 18*  GLUCOSE 71  --  61* 90  BUN 35*  --  33* 42*  CREATININE 9.50*  --  9.11* 10.24*  CALCIUM 8.9  --  7.8* 7.9*  MG  --  1.6*  --   --   AST 62*  --  63* 52*  ALT 33  --  31 30  ALKPHOS 77  --  69 92  BILITOT 0.4  --  0.6 0.9   ------------------------------------------------------------------------------------------------------------------ estimated creatinine clearance is 7.1 mL/min (by C-G formula based on SCr of 10.24 mg/dL (H)). ------------------------------------------------------------------------------------------------------------------ No results for input(s): HGBA1C in the last 72 hours. ------------------------------------------------------------------------------------------------------------------ No results for input(s): CHOL, HDL, LDLCALC, TRIG, CHOLHDL, LDLDIRECT in the last 72  hours. ------------------------------------------------------------------------------------------------------------------ No results for input(s): TSH, T4TOTAL, T3FREE, THYROIDAB in the last 72 hours.  Invalid input(s): FREET3 ------------------------------------------------------------------------------------------------------------------  Recent Labs  04/11/16 1408  TIBC NOT CALCULATED  IRON 17*    Coagulation profile  Recent Labs Lab 03/23/2016 2244  INR 1.34    No results for input(s): DDIMER in the last 72 hours.  Cardiac Enzymes No results for input(s): CKMB, TROPONINI, MYOGLOBIN in the last 168 hours.  Invalid input(s): CK ------------------------------------------------------------------------------------------------------------------ Invalid input(s): POCBNP   CBG:  Recent Labs Lab 04/11/16 0810 04/11/16 1315 04/11/16 1756 04/11/16 2158 04/12/16 0747  GLUCAP 82 88 94 112* 126*       Studies: No results found.    Lab Results  Component Value Date   HGBA1C 5.5 04/01/2016   HGBA1C 6.3 (H) 08/11/2013   Lab Results  Component Value Date   CREATININE 10.24 (H) 04/11/2016       Scheduled Meds: . aspirin  150 mg Rectal Daily  . darbepoetin (ARANESP) injection - DIALYSIS  60 mcg Intravenous Q Sat-HD  . famotidine (PEPCID) IV  20 mg Intravenous Q12H  . fluconazole (DIFLUCAN) IV  200 mg Intravenous Q24H  . insulin aspart  0-5 Units Subcutaneous QHS  . insulin aspart  0-9 Units Subcutaneous TID WC  . ipratropium  2 spray Each Nare BID  . mouth rinse  15 mL Mouth Rinse BID  . piperacillin-tazobactam (ZOSYN)  IV  3.375 g Intravenous Q12H  . sodium chloride flush  3 mL Intravenous Q12H  . [START ON 04/14/2016] vancomycin  1,000 mg Intravenous Q T,Th,Sa-HD   Continuous Infusions:     LOS: 3 days    Time spent: >30 MINS    [facility]  [facility] Pager [phone]. If 7PM-7AM, please contact night-coverage at [email or web address], password  [facility] 04/12/2016, 8:28 AM  LOS: 3 days

## 2016-04-12 NOTE — Discharge Instructions (Signed)
Ok to shower  Cover incisions with dry bandages as needed.  Expect some drainage from the upper incision

## 2016-04-12 NOTE — Progress Notes (Addendum)
Subjective:  Hd yest , / now pleasantly confused  Cos some lower back discomfort/ wife in Room now ="Appear better than he was yesterday"/ RN  Helping pt up to bed side chair  Now   Objective Vital signs in last 24 hours: Vitals:   04/11/16 1614 04/11/16 1700 04/11/16 2051 04/12/16 0553  BP: (!) 135/58 131/72 139/60 (!) 151/70  Pulse: (!) 112 (!) 110 (!) 106 (!) 111  Resp: 17 17 17 16   Temp: 99.6 F (37.6 C) 97.9 F (36.6 C) 98.6 F (37 C) 98.6 F (37 C)  TempSrc: Oral Oral Oral   SpO2: 94% 92% 97% 99%  Weight:      Height:       Weight change: -1.3 kg (-2 lb 13.9 oz)  Physical Exam: General: Thin elderly WM chronically  Ill, "at [place] , not sure of day" Heart: RRR 2/6 sem  lsb  ,no rub or gallop Lungs: CTA  nonlabored breathing  Abdomen: BS +, R fem area bruit / slighlty tender throughout, soft / surgical staples  Site clean /dry prior PD site  Extremities: no pedal edema  Dialysis Access: LUA AVF pos bruit     OP Dialysis:PD diccontinued due to refractory infection  Problem/Plan: 1. Refractory peritonitis, culture negative - TNC 3000-6000 in spite of IP then IV abx,sp cath removal  04/12/2016  ,confused, etc > looking better already sp cath removal yest. Appreciate gen surg assistance.  IP and  cx's still no growth /fu op cultures OP pd fluid gram stain was + for "yeast", and the culture is growing "mold", so this is a fungal infection.  Will get f/u on this tomorrow. Looking a lot better every day. Cont diflucan, have dc'd other abx.  2. Hypotension - resolved  3. Vol - prob some mild excess from admit IVF's, BP's coming up 4. ESRD - transitioning PD to HD, has AVF functional L arm. HD yesterday  /per wife [facility] HD [facility] / HD in am  Then check his op hd schedule /Verify clipped to [facility] Next HD tues 5. Hypokalemia - better 3.8 yest.  6. Confusion - due to infection -improving per wife this am  Needs PT 7. CAD hx stent x 1 8. CM EF 30% - stable 9. 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 Nutrition = low alb  With PD and Peritonitis  / add supplement/ renal carb mod diet    Ernest Haber, PA-C [facility] 04/12/2016,10:49 AM  LOS: 3 days    Pt seen, examined, agree w assess/plan as above with additions as indicated.  Kelly Splinter MD [facility] pager [phone]    cell [phone] 04/12/2016, 12:50 PM      Labs: Basic Metabolic Panel:  Recent Labs Lab 03/29/2016 1623 04/04/2016 0512 04/11/16 0343  NA 132* 132* 135  K 2.7* 3.5 3.8  CL 93* 103 104  CO2 23 18* 18*  GLUCOSE 71 61* 90  BUN 35* 33* 42*  CREATININE 9.50* 9.11* 10.24*  CALCIUM 8.9 7.8* 7.9*   Liver Function Tests:  Recent Labs Lab 04/11/2016 1623 03/31/2016 0512 04/11/16 0343  AST 62* 63* 52*  ALT 33 31 30  ALKPHOS 77 69 92  BILITOT 0.4 0.6 0.9  PROT 5.1* 4.8* 5.4*  ALBUMIN 1.7* 1.5* 1.4*    Recent Labs Lab 03/26/2016 1623  LIPASE 22     Recent Labs Lab 03/27/2016 1623 04/11/2016 0512 04/11/16 0343  WBC 10.4 13.7* 12.5*  NEUTROABS 7.6  --   --   HGB 8.5* 8.1* 8.4*  HCT 27.8* 26.7* 27.9*  MCV 104.9* 105.1* 104.9*  PLT 236 207 219   CBG:  Recent Labs Lab 04/11/16 0810 04/11/16 1315 04/11/16 1756 04/11/16 2158 04/12/16 0747  GLUCAP 82 88 94 112* 126*    Studies/Results: No results found. Medications:   . aspirin EC  81 mg Oral Daily  . darbepoetin (ARANESP) injection - DIALYSIS  60 mcg Intravenous Q Sat-HD  . famotidine (PEPCID) IV  20 mg Intravenous Q12H  . fluconazole (DIFLUCAN) IV  200 mg Intravenous Q24H  . glipiZIDE  2.5 mg Oral QAC breakfast  . heparin subcutaneous  5,000 Units Subcutaneous Q8H  . ipratropium  2 spray Each Nare BID  . mouth rinse  15 mL Mouth Rinse BID  . piperacillin-tazobactam (ZOSYN)  IV  3.375 g Intravenous Q12H  . sodium chloride flush  3 mL Intravenous Q12H  . [START ON 04/14/2016] vancomycin   1,000 mg Intravenous Q T,Th,Sa-HD

## 2016-04-13 ENCOUNTER — Inpatient Hospital Stay (HOSPITAL_COMMUNITY): Payer: Medicare Other

## 2016-04-13 DIAGNOSIS — R1030 Lower abdominal pain, unspecified: Secondary | ICD-10-CM

## 2016-04-13 DIAGNOSIS — R404 Transient alteration of awareness: Secondary | ICD-10-CM

## 2016-04-13 LAB — CBC
HCT: 31.2 % — ABNORMAL LOW (ref 39.0–52.0)
Hemoglobin: 9.4 g/dL — ABNORMAL LOW (ref 13.0–17.0)
MCH: 32 pg (ref 26.0–34.0)
MCHC: 30.1 g/dL (ref 30.0–36.0)
MCV: 106.1 fL — AB (ref 78.0–100.0)
PLATELETS: 259 10*3/uL (ref 150–400)
RBC: 2.94 MIL/uL — AB (ref 4.22–5.81)
RDW: 17.4 % — ABNORMAL HIGH (ref 11.5–15.5)
WBC: 10.2 10*3/uL (ref 4.0–10.5)

## 2016-04-13 LAB — COMPREHENSIVE METABOLIC PANEL
ALT: 32 U/L (ref 17–63)
ANION GAP: 18 — AB (ref 5–15)
AST: 41 U/L (ref 15–41)
Albumin: 1.6 g/dL — ABNORMAL LOW (ref 3.5–5.0)
Alkaline Phosphatase: 79 U/L (ref 38–126)
BUN: 40 mg/dL — ABNORMAL HIGH (ref 6–20)
CALCIUM: 8.5 mg/dL — AB (ref 8.9–10.3)
CHLORIDE: 102 mmol/L (ref 101–111)
CO2: 18 mmol/L — ABNORMAL LOW (ref 22–32)
CREATININE: 8.69 mg/dL — AB (ref 0.61–1.24)
GFR, EST AFRICAN AMERICAN: 6 mL/min — AB (ref >60)
GFR, EST NON AFRICAN AMERICAN: 5 mL/min — AB (ref >60)
Glucose, Bld: 135 mg/dL — ABNORMAL HIGH (ref 65–99)
Potassium: 4.4 mmol/L (ref 3.5–5.1)
Sodium: 138 mmol/L (ref 135–145)
Total Bilirubin: 1.2 mg/dL (ref 0.3–1.2)
Total Protein: 5.7 g/dL — ABNORMAL LOW (ref 6.5–8.1)

## 2016-04-13 LAB — GLUCOSE, CAPILLARY: GLUCOSE-CAPILLARY: 79 mg/dL (ref 65–99)

## 2016-04-13 LAB — PHOSPHORUS: Phosphorus: 4.7 mg/dL — ABNORMAL HIGH (ref 2.5–4.6)

## 2016-04-13 LAB — MAGNESIUM: MAGNESIUM: 2 mg/dL (ref 1.7–2.4)

## 2016-04-13 MED ORDER — ACETAMINOPHEN 325 MG PO TABS
650.0000 mg | ORAL_TABLET | Freq: Four times a day (QID) | ORAL | Status: DC | PRN
  Administered 2016-04-13 – 2016-04-18 (×7): 650 mg via ORAL
  Filled 2016-04-13 (×6): qty 2

## 2016-04-13 MED ORDER — FAMOTIDINE 20 MG PO TABS
20.0000 mg | ORAL_TABLET | Freq: Every day | ORAL | Status: DC
  Administered 2016-04-13 – 2016-04-19 (×5): 20 mg via ORAL
  Filled 2016-04-13 (×6): qty 1

## 2016-04-13 NOTE — Consult Note (Signed)
   [facility] Inpatient Consult   04/13/2016  [place] Jan 16, 1946 BP:[id number]    Patient screened for [facility] services. Went to bedside to offer and explain [facility] program with patient's wife. Mrs. Dohner states she is not exactly sure what patient will need at this point for discharge. Accepted [facility] Care Management brochure with contact information to call in future if changes mind. Will make inpatient RNCM aware that Mrs. Bruni was not ready to make and decision on [facility] Care Management program services at this time.  Marthenia Rolling, MSN-Ed, RN,BSN [facility] Liaison [phone]

## 2016-04-13 NOTE — Progress Notes (Signed)
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 He presents to ED sent by renal MD for PD cath removal for refractory culture negative peritonitis.Per chart last ECHO shows EF 30%. Hypotensive on arrival. Possible small left lower lobe infiltrate. Found to have recurrent peritonitis, sepsis. Peritoneal catheter removed under surgery on 10/20. Pt has reported dysphagia to family and providers.   Type of Study Bedside Swallow Evaluation  Previous Swallow Assessment none in chart  Diet Prior to this Study Regular;Thin liquids  Temperature Spikes Noted No  Respiratory Status Room air  History of Recent Intubation No  Behavior/Cognition Lethargic/Drowsy;Distractible;Doesn't follow directions  Oral Cavity Assessment Dried secretions  Oral Care Completed by SLP Yes  Oral Cavity - Dentition Dentures, top  Vision Impaired for self-feeding  Self-Feeding Abilities Needs assist  Patient Positioning Upright in bed  Baseline Vocal Quality Breathy  Volitional Cough Cognitively unable to elicit  Volitional Swallow Unable to elicit  Pain Assessment  Pain Assessment Faces  Faces Pain Scale 6  Oral Motor/Sensory Function  Overall Oral Motor/Sensory Function Other (comment) (could not follow commands for assessment)  Thin Liquid  Thin Liquid Impaired  Presentation Cup  Pharyngeal  Phase Impairments Cough - Delayed;Multiple swallows;Decreased hyoid-laryngeal movement (grimace, pain "its going down the wrong pipe!")  Nectar Thick Liquid  Nectar Thick Liquid NT  Honey Thick Liquid  Honey Thick Liquid NT  Puree  Puree NT  Solid  Solid NT  SLP Assessment  Clinical Impression Statement (ACUTE ONLY) Pt demonstrates multiple problems impacting ability to consume PO. Primary  problem is encephalopathy' pt is intermittently alert, not fully aware of PO. He was found to have pills pocketed in his mouth. 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 Pt is observed to have hard fibrotic tissue at his neck. He was only able to take one sip of water when resulted in delayed cough and immediate grimacing. Pt reported he felt it went down wrong. He is at high risk for silent aspiration and sensory/motor deficits. Given that pt has refused PO for days and is not taking medication efficiently, would recommend short term alternative means of nutrition until pts mentation improves for objective swallow testing.   Impact on safety and function Severe aspiration risk;Risk for inadequate nutrition/hydration  Other Related Risk Factors History of dysphagia  Swallow Evaluation Recommendations  SLP Diet Recommendations NPO;Alternative means - temporary;Ice chips PRN after oral care  Medication Administration Via alternative means  Treatment Plan  Oral Care Recommendations Oral care QID  Treatment Recommendations Therapy as outlined in treatment plan below  Follow up Recommendations Inpatient Rehab  Speech Therapy Frequency (ACUTE ONLY) min 2x/week  Treatment Duration 2 weeks  Prognosis  Prognosis for Safe Diet Advancement Good  Barriers to Reach Goals Cognitive deficits  Individuals Consulted  Consulted and Agree with Results and Recommendations Patient;Family member/caregiver  Family Member Consulted wife  Progression Toward Goals  Progression toward goals Progressing toward goals  SLP Time Calculation  SLP Start Time (ACUTE ONLY) 1013  SLP Stop Time (ACUTE ONLY) 1034  SLP Time Calculation (min) (ACUTE ONLY) 21 min  SLP Evaluations  $ SLP Speech Visit 1 Procedure  SLP Evaluations  $BSS Swallow 1 Procedure

## 2016-04-13 NOTE — Progress Notes (Signed)
[facility] Progress Note    Assessment/Plan: 1. Peritonitis -admit with high cell count -  OP PD culture drawn 04/08/16 gs positive for yeast/ "mold" isolated PD cath removed 04/09/2016  now on Diflucan IV -  IP blood/peritoneal cultures negative to date - abx d/c'd will need ID of fungus.   2. ESRD - transitioning from PD to HD on TTS schedule - OP HD at [facility]  3. 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 HTN/volume -  BP controlled/ CXR today with pulmonary venous congestion/ at OP EDW of 82kg   - for HD tomorrow UF goal 1-2L  6. Nutrition - Alb 1.4 with PD and peritonitis poor PO intake during admit/on protein supp 7. AMS - brain MRI pending / per wife seems to be improving but not quite at baseline  8. CAD - per primary on aspirin 9. DM Type II - per primary  10 encephalopathy worrisome, may need LP. And furthur w/u   Lynnda Child PA-C [facility] Pager [phone] 04/13/2016,9:57 AM  LOS: 4 days   Subjective: seen with wife at bedside, groggy, slow to respond to question seems uncomfortable in bed   Objective Vitals:   04/12/16 0553 04/12/16 1310 04/12/16 2116 04/13/16 0539  BP: (!) 151/70 (!) 159/72 (!) 113/57 (!) 149/76  Pulse: (!) 111 94 (!) 109 (!) 116  Resp: 16 16 17 19   Temp: 98.6 F (37 C) 98.6 F (37 C) 97.9 F (36.6 C) 98.4 F (36.9 C)  TempSrc:  Oral Oral   SpO2: 99% 99% 96% 95%  Weight:      Height:       Physical Exam General: Groggy, ill appearing elderly WM, rolling around in bed not comfortable  Heart: RRR  Lungs: CTAB, breathing non-labored  Abdomen: soft,  mildly distended no localized tenderness - PD cath site clean no drainage  Extremities: trace LE edema  Dialysis Access: LUE AVF +thrill   Additional Objective Labs: Basic Metabolic Panel:  Recent Labs Lab 04/12/2016 1623 04/08/2016 0512  04/11/16 0343  NA 132* 132* 135  K 2.7* 3.5 3.8  CL 93* 103 104  CO2 23 18* 18*  GLUCOSE 71 61* 90  BUN 35* 33* 42*  CREATININE 9.50* 9.11* 10.24*  CALCIUM 8.9 7.8* 7.9*   Liver Function Tests:  Recent Labs Lab 04/11/2016 1623 04/08/2016 0512 04/11/16 0343  AST 62* 63* 52*  ALT 33 31 30  ALKPHOS 77 69 92  BILITOT 0.4 0.6 0.9  PROT 5.1* 4.8* 5.4*  ALBUMIN 1.7* 1.5* 1.4*    Recent Labs Lab 04/13/2016 1623  LIPASE 22   CBC:  Recent Labs Lab 03/30/2016 1623 03/29/2016 0512 04/11/16 0343  WBC 10.4 13.7* 12.5*  NEUTROABS 7.6  --   --   HGB 8.5* 8.1* 8.4*  HCT 27.8* 26.7* 27.9*  MCV 104.9* 105.1* 104.9*  PLT 236 207 219   Blood Culture    Component Value Date/Time   SDES BLOOD LEFT ANTECUBITAL 04/02/2016 1623   SPECREQUEST BOTTLES DRAWN AEROBIC AND ANAEROBIC 5CC 04/15/2016 1623   CULT NO GROWTH 3 DAYS 03/25/2016 1623   REPTSTATUS PENDING 04/18/2016 1623    Cardiac Enzymes: No results for input(s): CKTOTAL, CKMB, CKMBINDEX, TROPONINI in the last 168 hours. CBG:  Recent Labs Lab 04/11/16 0810 04/11/16 1315 04/11/16 1756 04/11/16 2158 04/12/16  0747  GLUCAP 82 88 94 112* 126*   Iron Studies:  Recent Labs  04/11/16 1408  IRON 17*  TIBC NOT CALCULATED   Lab Results  Component Value Date   INR 1.34 04/11/2016   INR 1.23 04/02/2016   INR 1.01 08/21/2014   Medications:   . aspirin EC  81 mg Oral Daily  . darbepoetin (ARANESP) injection - DIALYSIS  60 mcg Intravenous Q Sat-HD  . famotidine (PEPCID) IV  20 mg Intravenous Q24H  . feeding supplement (NEPRO CARB STEADY)  237 mL Oral BID BM  . fluconazole (DIFLUCAN) IV  200 mg Intravenous Q24H  . glipiZIDE  2.5 mg Oral QAC breakfast  . heparin subcutaneous  5,000 Units Subcutaneous Q8H  . ipratropium  2 spray Each Nare BID  . mouth rinse  15 mL Mouth Rinse BID  . multivitamin  1 tablet Oral QHS  . sodium chloride flush  3 mL Intravenous Q12H

## 2016-04-13 NOTE — Progress Notes (Addendum)
[facility] PROGRESS NOTE  Victor Lane [id number] DOB: 07-09-1945 DOA: 04/18/2016   PCP: Victor Miyamoto, MD     Assessment/Plan: Principal Problem:   Peritonitis ([place]) Active Problems:   HTN (hypertension)   Anemia of chronic disease   Continuous ambulatory peritoneal dialysis status ([place])   ESRD on dialysis ([place])   Dilated aortic root (HCC)   CAD (coronary artery disease), native coronary artery   Dyslipidemia   Chronic systolic congestive heart failure (HCC)   Carotid stenosis   Abdominal pain   Diabetes mellitus with complication (HCC)   Hypokalemia   Acute encephalopathy     70 y.o. 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  He presents to ED sent by renal MD for PD cath removal for refractory culture negative peritonitis. Per chart last ECHO shows EF 30%. Hypotensive on arrival. Possible small left lower lobe infiltrate.    Assessment and plan Peritonitis (HCC)/left lower lobe pneumonia / sepsis:  his ascites fluid cultures from 10/11 was negative, but has high cell count in fluid, indicating persistent or recurrent peritonitis. Pt was suspected to have sepsis on presentation with elevated lactate, tachycardia, tachypnea and hypotension. Blood pressure responded to IV fluid resuscitation,    Nephrology, Victor Jaffe, MD  was consulted.  Status pos removal of PD cath by general surgery    Hemodynamics have improved Currently on vancomycin, Zosyn since 10/19, along  with  Diflucan [outpatient culture suspicious for   yeast] Duration of antibiotics -probably will need 2 weeks of treatment Blood culture from 10/19 negative, culture from peritoneal washings 10/11 negative   Acute metabolic encephalopathy:  continues to have intermittent confusion Need to rule out  ongoing infection, repeat chest x-ray to evaluate left pleural effusion Continue to treat underlying infection MRI of the brain to rule out CVA Ammonia level within normal limits   ESRD-on PD: None hemodialysis per Dr. Jonnie Lane,   Transitioned PD to HD,AVF functional L arm. HD as per nephrology recommendations  clipped to [facility]   Hypokalemia: K= 2.7, resolved A.m. labs pending   CAD: s/p of stent. No CP -continue aspirin per rectal, IV metoprolol  HTN: -hold oral meds -IV hydralazine when necessary and IV metoprolol with holding parameters.  Chronic systolic congestive heart failure: 2-D echo on 10/08/14 showed EF 30-35 percent. Patient does not have leg edema or JVD. CHF seems to be compensated on admission. Pt is not on diuretics at home. Volume status being managed with hemodialysis -Watch volume status closely  DM-II: Last A1c 5.5, well controled. Patient is taking glipizide at home CBGs have remained stable, will DC Accu-Cheks   Back pain-patient's wife states that patient had a recent fall  Plain films show spondylosis of the lumbar spine, no fracture  Dysphagia-patient deemed to be virus for aspiration, will keep the patient NPO except meds, patient has had difficulty swallowing medications,MBS swallow pending   DVT prophylaxsis heparin  Code Status:  Full code    Family Communication: Discussed in detail with the patient's wife , all imaging results, lab results explained to the patient   Disposition  PT OT evaluation     Consultants:  General surgery  Nephrology  Procedures:  None  Antibiotics: Vancomycin 10/19 - Zosyn 10/19 -  Diflucan 10/19-      HPI/Subjective: Wife is by the bedside, patient still confused    Objective: Vitals:   04/12/16 0553 04/12/16 1310 04/12/16 2116 04/13/16 0539  BP: (!) 151/70 (!) 159/72 (!) 113/57 (!) 149/76  Pulse: (!) 111 94 (!) 109 (!) 116  Resp: 16 16 17 19   Temp: 98.6 F (37 C) 98.6 F (37 C) 97.9  F (36.6 C) 98.4 F (36.9 C)  TempSrc:  Oral Oral   SpO2: 99% 99% 96% 95%  Weight:      Height:        Intake/Output Summary (Last 24 hours) at 04/13/16 [id number] Last data filed at 04/13/16 0539  Gross per 24 hour  Intake              160 ml  Output                0 ml  Net              160 ml    Exam:  Examination:  General exam: Confused  Respiratory system: Clear to auscultation. Respiratory effort normal. Cardiovascular system: S1 & S2 heard, RRR. No JVD, murmurs, rubs, gallops or clicks. No pedal edema. Gastrointestinal system: Abdomen is nondistended, soft and nontender. No organomegaly or masses felt. Normal bowel sounds heard. Central nervous system:  . No focal neurological deficits. Extremities: Symmetric 5 x 5 power. Skin: No rashes, lesions or ulcers Psychiatry: Judgement and insight appear normal. Mood & affect appropriate.     Data Reviewed: I have personally reviewed following labs and imaging studies  Micro Results Recent Results (from the past 240 hour(s))  C difficile quick scan w PCR reflex     Status: None   Collection Time: 04/16/2016  4:07 PM  Result Value Ref Range Status   C Diff antigen NEGATIVE NEGATIVE Final   C Diff toxin NEGATIVE NEGATIVE Final   C Diff interpretation No C. difficile detected.  Final  Gastrointestinal Panel by PCR , Stool     Status: None   Collection Time: 03/26/2016  4:07 PM  Result Value Ref Range Status   Campylobacter species NOT DETECTED NOT DETECTED Final   Plesimonas shigelloides NOT DETECTED NOT DETECTED Final   Salmonella species NOT DETECTED NOT DETECTED Final   Yersinia enterocolitica NOT DETECTED NOT DETECTED Final   Vibrio species NOT DETECTED NOT DETECTED Final   Vibrio cholerae NOT DETECTED NOT DETECTED Final   Enteroaggregative E coli (EAEC) NOT DETECTED NOT DETECTED Final   Enteropathogenic E coli (EPEC) NOT DETECTED NOT DETECTED Final   Enterotoxigenic E coli (ETEC) NOT DETECTED NOT DETECTED Final   Shiga  like toxin producing E coli (STEC) NOT DETECTED NOT DETECTED Final   Shigella/Enteroinvasive E coli (EIEC) NOT DETECTED NOT DETECTED Final   Cryptosporidium NOT DETECTED NOT DETECTED Final   Cyclospora cayetanensis NOT DETECTED NOT DETECTED Final   Entamoeba histolytica NOT DETECTED NOT DETECTED Final   Giardia lamblia NOT DETECTED NOT DETECTED Final   Adenovirus F40/41 NOT DETECTED NOT DETECTED Final   Astrovirus NOT DETECTED NOT DETECTED Final   Norovirus GI/GII NOT DETECTED NOT DETECTED Final   Rotavirus A NOT DETECTED NOT DETECTED Final   Sapovirus (I, II, IV, and V) NOT DETECTED NOT DETECTED Final  Blood Culture (routine x 2)     Status: None (Preliminary result)   Collection Time: 03/27/2016  4:17 PM  Result Value Ref Range Status   Specimen Description BLOOD RIGHT HAND  Final  Special Requests BOTTLES DRAWN AEROBIC AND ANAEROBIC 5CC  Final   Culture NO GROWTH 3 DAYS  Final   Report Status PENDING  Incomplete  Blood Culture (routine x 2)     Status: None (Preliminary result)   Collection Time: 03/24/2016  4:23 PM  Result Value Ref Range Status   Specimen Description BLOOD LEFT ANTECUBITAL  Final   Special Requests BOTTLES DRAWN AEROBIC AND ANAEROBIC 5CC  Final   Culture NO GROWTH 3 DAYS  Final   Report Status PENDING  Incomplete  MRSA PCR Screening     Status: None   Collection Time: 04/15/2016 10:29 AM  Result Value Ref Range Status   MRSA by PCR NEGATIVE NEGATIVE Final    Comment:        The GeneXpert MRSA Assay (FDA approved for NASAL specimens only), is one component of a comprehensive MRSA colonization surveillance program. It is not intended to diagnose MRSA infection nor to guide or monitor treatment for MRSA infections.     Radiology Reports Dg Chest 2 View  Result Date: 04/01/2016 CLINICAL DATA:  Suspected sepsis.  Peritoneal dialysis patient. EXAM: CHEST  2 VIEW COMPARISON:  Chest radiograph 07/19/2015.  Abdominal CT 03/28/2016 FINDINGS: Stable cardiomegaly  and mediastinal contours. There is thoracic aortic atherosclerosis. Small bilateral pleural effusions and bibasilar airspace disease. No pulmonary edema. No pneumothorax. Vascular stent in the left axilla. Free air under the right hemidiaphragm, likely related to peritoneal dialysis. Resorption of the distal right clavicle. IMPRESSION: Chronic cardiomegaly, stable.  Thoracic aortic atherosclerosis. Small bilateral pleural effusions with bibasilar airspace disease, likely atelectasis. Electronically Signed   By: Jeb Levering M.D.   On: 04/01/2016 05:40   Dg Thoracic Spine 2 View  Result Date: 04/12/2016 CLINICAL DATA:  70 year old male with history of back pain and weight loss. EXAM: THORACIC SPINE 2 VIEWS COMPARISON:  No priors. FINDINGS: There is no evidence of thoracic spine fracture. Alignment is normal. Mild multilevel degenerative disc disease, typical for the patient's age. No other significant bone abnormalities are identified. Aortic atherosclerosis. Vascular stent projecting over the left axillary region, incompletely visualized. IMPRESSION: 1. No acute radiographic abnormality of the thoracic spine. 2. Aortic atherosclerosis. Electronically Signed   By: Vinnie Langton M.D.   On: 04/12/2016 15:19   Dg Lumbar Spine 2-3 Views  Result Date: 04/12/2016 CLINICAL DATA:  Back pain lumbar region. EXAM: LUMBAR SPINE - 2-3 VIEW COMPARISON:  04/04/2013 FINDINGS: Vertebral body alignment, heights and disc space heights are normal. There is mild spondylosis throughout the lumbar spine to include facet arthropathy over the lower lumbar spine. No compression fracture or subluxation. Moderate calcified plaque over the abdominal aorta and iliac arteries IMPRESSION: Mild spondylosis of the lumbar spine to include facet arthropathy. Electronically Signed   By: Marin Olp M.D.   On: 04/12/2016 15:25   Dg Pelvis 1-2 Views  Result Date: 04/12/2016 CLINICAL DATA:  Low back pain. Unable to obtain history  from patient. EXAM: PELVIS - 1-2 VIEW COMPARISON:  None. FINDINGS: Mild symmetric degenerative change of the hips. Mild diffuse decreased bone mineralization is present. There is no acute fracture or dislocation. Skin staples are present over the soft tissues of the left hip region. Several pelvic phleboliths. Calcified plaque over the iliac and femoral arteries. IMPRESSION: No acute findings. Electronically Signed   By: Marin Olp M.D.   On: 04/12/2016 15:26   Ct Abdomen Pelvis W Contrast  Result Date: 03/29/2016 CLINICAL DATA:  Acute onset of generalized abdominal pain. Recent peritoneal  dialysis. Vomiting and diarrhea. Initial encounter. EXAM: CT ABDOMEN AND PELVIS WITH CONTRAST TECHNIQUE: Multidetector CT imaging of the abdomen and pelvis was performed using the standard protocol following bolus administration of intravenous contrast. CONTRAST:  147mL ISOVUE-300 IOPAMIDOL (ISOVUE-300) INJECTION 61% COMPARISON:  CT of the abdomen and pelvis from 11/26/2014 FINDINGS: Lower chest: Trace right-sided pleural fluid is noted. Bibasilar atelectasis or scarring is seen. The visualized portions of the mediastinum are unremarkable. Hepatobiliary: A small amount of ascites is seen tracking about the liver. The liver is unremarkable in appearance. The patient is status post cholecystectomy, with clips noted at the gallbladder fossa. The common bile duct remains normal in caliber. Pancreas: The pancreas is within normal limits. Spleen: The spleen is unremarkable in appearance. Adrenals/Urinary Tract: The adrenal glands are grossly unremarkable in appearance. Severe chronic bilateral renal atrophy is noted, with scattered bilateral renal cysts. There is no evidence of hydronephrosis. No renal or ureteral stones are identified. No significant perinephric stranding is seen. Stomach/Bowel: The stomach is unremarkable in appearance. The small bowel is within normal limits. The appendix is normal in caliber, without evidence  of appendicitis. Scattered diverticulosis is noted along the distal descending and sigmoid colon, without definite evidence of diverticulitis. Vascular/Lymphatic: Diffuse calcification is seen along the abdominal aorta and its branches. There is relatively severe luminal narrowing at the proximal right common iliac artery, and moderate luminal narrowing along the left common iliac artery. Relatively severe luminal narrowing is also noted along the proximal left superficial femoral artery. No retroperitoneal or pelvic sidewall lymphadenopathy is seen. Reproductive: The bladder is mildly distended and grossly unremarkable. The prostate remains normal in size. Other: The patient's peritoneal dialysis catheter is noted within the pelvis, with a small to moderate amount of free fluid in the pelvis and along both paracolic gutters. Musculoskeletal: No acute osseous abnormalities are identified. The visualized musculature is unremarkable in appearance. IMPRESSION: 1. No acute abnormality seen to explain the patient's symptoms. 2. Status post recent peritoneal dialysis. Small to moderate volume ascites noted within the abdomen and pelvis. 3. Diffuse aortic atherosclerosis noted. Relatively severe luminal narrowing at the proximal right common iliac artery, and moderate luminal narrowing along the left common iliac artery. Relatively severe luminal narrowing also noted along the proximal left superficial femoral artery. 4. Severe chronic bilateral renal atrophy, with scattered bilateral renal cysts. 5. Scattered diverticulosis along the distal descending and sigmoid colon, without definite evidence of diverticulitis. 6. Trace right-sided pleural fluid. Bibasilar atelectasis or scarring noted. Electronically Signed   By: Garald Balding M.D.   On: 03/29/2016 00:10   Dg Chest Port 1 View  Result Date: 03/28/2016 CLINICAL DATA:  Sepsis.  History of head and neck cancer. EXAM: PORTABLE CHEST 1 VIEW COMPARISON:  Chest  radiograph April 01, 2016 FINDINGS: Cardiac silhouette is mildly enlarged unchanged. Heavily calcified aortic. Diffuse interstitial prominence. Worsening retrocardiac consolidation and small LEFT pleural effusion. RIGHT lung base strandy densities. No pneumothorax. Surgical clips LEFT neck, stent projects in RIGHT neck and LEFT subclavian vessels. Postsurgical changes RIGHT distal clavicle. Bibasilar strandy densities IMPRESSION: Stable cardiomegaly. Worsening retrocardiac consolidation with small LEFT pleural effusion. RIGHT lung base atelectasis/ scarring. Worsening bronchitic changes. Electronically Signed   By: Elon Alas M.D.   On: 04/11/2016 19:40   Ct Angio Abdomen Pelvis  W &/or Wo Contrast  Result Date: 04/03/2016 CLINICAL DATA:  70 year old male with abdominal pain. Negative lactic acidosis. Renal failure with peritoneal dialysis catheter. EXAM: CT ANGIOGRAPHY  ABDOMEN AND PELVIS TECHNIQUE: Multidetector CT  imaging through the abdomen and pelvis was performed using the standard protocol during bolus administration of intravenous contrast. Delayed images included. Multiplanar reconstructed images including MIPs were obtained and reviewed to evaluate the vascular anatomy. CONTRAST:  100 cc Isovue 370 COMPARISON:  CT angiogram done the previous day, CT 03/28/2016, CT 11/26/2014 FINDINGS: VASCULAR Aorta: Extensive mixed calcified and soft plaque of the lower thoracic and abdominal aorta. No aneurysm. No dissection. No periaortic fluid. Celiac: Similar appearance of atherosclerotic changes at the origin of the celiac artery, which is patent though perhaps with 50% or greater stenosis. Post stenotic dilation. Typical branch pattern again noted, discussed on the prior CT. SMA: Similar stenotic appearance of the superior mesenteric artery with mixed calcified and soft plaque. Greater than 50% stenosis. Distal branches appear patent. Renals: Calcified and soft plaque at the origin the bilateral renal  arteries. Single bilateral renal arteries. IMA: Patent IMA. Likely stenosis at the origin. Difficult to quantify degree of stenosis. Right lower extremity: Mixed calcified and soft plaque of the right iliac system. Again, likely greater than 50% stenosis in the proximal right common iliac artery. Hypogastric artery and external iliac artery patent. Atherosclerotic disease throughout the external iliac artery. Common femoral artery in the proximal full more all vessels patent. Left lower extremity: Mixed calcified and soft plaque of the left iliac system. Likely stenosis at the distal common iliac artery. Hypogastric artery and external iliac artery patent. Disease through the length of the external iliac artery. Common femoral artery in the proximal fill more 0 vasculature patent on today's exam. Veins: Portal venous system remains patent. Unremarkable appearance of the systemic venous vasculature. Review of the MIP images confirms the above findings. NON-VASCULAR Lower chest: Respiratory motion somewhat limits evaluation of the lung bases. Coverage of the lower chest/ field-of-view larger on the current study. Plaque like thickening of the left sided pleura at the lung base. This has been present since the CT 11/26/2014 Coronary calcifications.  Aortic valve calcifications. Hepatobiliary: Morphology of the liver compatible with cirrhosis with enlarged left lobe, caudate lobe, and nodular configuration. No focal lesion identified. Pancreas: Unremarkable appearance of pancreas Spleen: Borderline enlarged spleen Adrenals/Urinary Tract: Unremarkable appearance of adrenal glands. Right: Severe renal cortical thinning. No hydronephrosis or nephrolithiasis. Hypodense lesion on the lateral cortex and at the superior cortex too small to characterize. Left: Severe renal cortical thinning. No hydronephrosis. Similar appearance of low-density cystic structures, the larger of which are compatible with simple cysts. Smaller are  too small to characterize. Excreted contrast within the urinary bladder. Stomach/Bowel: Unremarkable stomach. Small hiatal hernia. No evidence of small bowel obstruction or transition point. Minimal thickening of small bowel wall, persists after the prior CT. Small bowel wall enhances on the arterial and venous phase, with no CT evidence of ischemia. Retained oral contrast within colon, limiting evaluation of the mucosal surface. No evidence of obstruction. Small stool burden. Diverticular disease. Normal appendix. Lymphatic: Low-density ascites within the pericolic gutter, right upper quadrant, and within the dependent pelvis associated with the peritoneal dialysis catheter. No enhancement of the surface is the peritoneum and. Vascular/lymphatic congestion within small bowel mesenteric, similar to prior. Small lymph nodes, none of which are enlarged. Mesenteric: Fluid throughout the peritoneal of small volume. Small amount of anti dependent gas, similar to prior. Peritoneal dialysis catheter terminates within the low pelvis, adjacent to the rectum. Reproductive: Unremarkable appearance of the pelvic organs. Other: Small fat containing umbilical hernia. Musculoskeletal: Sclerotic appearance of the skeletal elements. No acute bony abnormality. IMPRESSION:  Vascular: No acute vascular abnormality. Similar to yesterday's study there is diffuse atherosclerotic changes of the abdominal aorta, with involvement of the mesenteric vasculature. Again, there is likely greater than 50% stenosis at the celiac artery and IMA artery, however, CTA resolution difficult to make precise assessment. The degree of stenosis at the SMA origin is likely high-grade given the post stenotic dilation and degree of disease. Distal branches of the mesenteric vessels remain patent. Bilateral renal artery stenosis. Portal venous system unremarkable. Systemic venous system unremarkable. Nonvascular: Morphology of the liver compatible with cirrhosis.  Associated borderline splenomegaly, indicating developing portal hypertension. No evidence of bowel ischemia, with uniform enhancement of stomach and small bowel. Although the colon wall cannot be assessed with the presence of positive enteric contrast, there are no focal findings to suggest colonic ischemia. Temporal resolution of mild small bowel thickening is improved with 24 hour follow-up CT, with mild thickening of the small bowel in the mid abdomen. There is associated lymphatic congestion of the small bowel mesenteric with associated small lymph nodes. Finding is nonspecific. There is no evidence of venous obstruction per se, although these findings can be seen with portal colopathy/enteropathy in the setting of cirrhosis and developing portal hypertension. Alternative consideration would be an nonspecific inflammatory or infectious enteritis. These results were called by telephone at the time of interpretation on 04/03/2016 at 8:56 am to Dr. Carles Collet, who verbally acknowledged these results. Small volume of low-density ascites, likely related to PD catheter. PD catheter within the dependent pelvis adjacent to rectum. No evidence of peritonitis on the contrast-enhanced exam. Persistent thickening of the left posterior lower pleura, unchanged from the CT 11/26/2014. This may reflect rounded atelectasis/scarring. Further evaluation with chest CT may be considered if the patient has a history of carcinoma. Coronary artery disease.  Aortic valve calcifications. Signed, Dulcy Fanny. Earleen Newport, DO [facility] Electronically Signed   By: Corrie Mckusick D.O.   On: 04/03/2016 08:59   Ct Angio Abd/pel W And/or Wo Contrast  Result Date: 04/01/2016 CLINICAL DATA:  70 year old male with a history of lower abdominal pain and nausea vomiting. Constipation. EXAM: CTA ABDOMEN AND PELVIS WITH CONTRAST TECHNIQUE: Multidetector CT imaging of the abdomen and pelvis was performed  using the standard protocol during bolus administration of intravenous contrast. Multiplanar reconstructed images and MIPs were obtained and reviewed to evaluate the vascular anatomy. CONTRAST:  75 cc Isovue 370 COMPARISON:  CT 03/28/2016, 11/26/2014, 08/06/2013 FINDINGS: VASCULAR Aorta: Diffuse atherosclerosis of the abdominal aorta with soft plaque and calcified plaque. No dissection. No aneurysm. No periaortic fluid. Celiac: Atherosclerotic changes at the origin of the celiac artery which is patent. Post stenotic dilation. Typical branch pattern of the celiac artery, with left gastric, common hepatic, and splenic artery identified. SMA: Atherosclerotic changes of the superior mesenteric artery, with mixed calcified and soft plaque at the origin. The SMA appears patent, with greater than 50% stenosis. Renals: Dense calcified and soft plaque at the origin the bilateral renal arteries. Renal arteries are patent. Single left and single right renal artery. IMA: Inferior mesenteric artery is patent, though appears stenotic at the origin. Left colic artery patent. Superior rectal artery patent. Right lower extremity: Dense calcified and soft plaque involving the right iliac system, including common iliac artery and external iliac artery. Small caliber vessels, with likely multifocal stenoses. Greatest degree of stenosis may be at the distal common iliac artery above the hypogastric origin. Hypogastric artery is patent. Common femoral artery patent. Proximal SFA and  profunda femoris patent. Left lower extremity: Mixed calcified and soft plaque of the left iliac system. Greatest degree of stenosis at the origin of the external iliac artery. Hypogastric artery patent. Common femoral artery patent. Proximal SFA and profunda femoris patent. Veins: Unremarkable appearance of the venous system. Review of the MIP images confirms the above findings. NON-VASCULAR Lower chest: Atelectatic changes at the lung bases. Small hiatal  hernia. Hepatobiliary: Morphologic changes of the liver compatible with cirrhosis with developing left hepatic enlargement. Nodular contour. No focal lesion identified on the current CT. No intrahepatic or extrahepatic biliary ductal dilatation. Cholecystectomy Pancreas: Unremarkable appearance of the pancreas. No pericholecystic fluid or inflammatory changes. Unremarkable ductal system. Spleen: Borderline splenomegaly. Adrenals/Urinary Tract: Unremarkable appearance of adrenal glands. Right: No hydronephrosis. Advanced cortical renal thinning. Symmetric perfusion to the left. Hypodense lesions within the cortex of the right kidney, the majority are too small to characterize though arm most likely benign cysts. Left: No hydronephrosis. Advanced cortical renal thinning come symmetric to the right. Symmetric perfusion. Cystic lesions in the cortex of the left kidney, most likely all benign cysts although the majority are too small to characterize. Unremarkable appearance of the urinary bladder with high density excreted contrast. Stomach/Bowel: Unremarkable appearance of the stomach. Small hiatal hernia. Unremarkable appearance of small bowel. Venous phase demonstrates enhancement of the small bowel and colon, although the colon somewhat limited by the presence of enteric contrast. Colonic diverticula. Lymphatic: Multiple lymph nodes in the para-aortic nodal station, none of which are enlarged. Mesenteric: Low-density free fluid within the abdomen, associated with peritoneal dialysis catheter. Small volume of free air in the anti dependent aspect. Reproductive: Unremarkable appearance of the prostate and pelvic organs. Other: Small fat containing umbilical hernia. Unremarkable appearance of the tract of the dialysis catheter. Musculoskeletal: No evidence of acute fracture. Developing sclerotic appearance of the skeletal structures, potentially early renal osteodystrophy. No bony canal narrowing. No significant  degenerative changes of the hips. IMPRESSION: VASCULAR No acute vascular abnormality identified. Advanced aortic atherosclerosis. There are associated changes at the origin of all mesenteric vessel, though they remain patent. There may be 50 percent stenosis at the celiac artery origin, with greater than 50 percent stenosis of the superior mesenteric artery origin, and potentially greater than 50 percent stenosis at the origin of the IMA, although degree of stenosis cannot be determined by CTA. This pattern of disease certainly places the patient at risk for chronic mesenteric ischemia. Correlation with patient's history may be useful, and potentially mesenteric duplex exam if there is concern for compromise. Bilateral renal artery stenosis, potentially contributing to advanced renal cortical thinning. NON-VASCULAR Morphologic changes of the liver compatible with cirrhosis. Referral for establishing gastroenterology care may be considered if not already established, as well as consideration of initiating surveillance for hepatocellular carcinoma. Peritoneal dialysis catheter with free fluid likely associated with treatment. Tiny amount of anti dependent gas within the abdomen, again likely associated with the peritoneal dialysis. Again, bilateral renal cortical cysts. While these are most likely benign, there is incomplete characterization given the size. Developing renal osteodystrophy. Signed, Dulcy Fanny. Earleen Newport, DO [facility] Electronically Signed   By: Corrie Mckusick D.O.   On: 04/01/2016 09:00     CBC  Recent Labs Lab 03/31/2016 1623 04/08/2016 0512 04/11/16 0343  WBC 10.4 13.7* 12.5*  HGB 8.5* 8.1* 8.4*  HCT 27.8* 26.7* 27.9*  PLT 236 207 219  MCV 104.9* 105.1* 104.9*  MCH 32.1 31.9 31.6  MCHC 30.6 30.3  30.1  RDW 17.5* 17.3* 17.4*  LYMPHSABS 0.7  --   --   MONOABS 0.5  --   --   EOSABS 1.5*  --   --   BASOSABS 0.1  --   --      Chemistries   Recent Labs Lab 04/14/2016 1623 03/29/2016 2244 04/07/2016 0512 04/11/16 0343  NA 132*  --  132* 135  K 2.7*  --  3.5 3.8  CL 93*  --  103 104  CO2 23  --  18* 18*  GLUCOSE 71  --  61* 90  BUN 35*  --  33* 42*  CREATININE 9.50*  --  9.11* 10.24*  CALCIUM 8.9  --  7.8* 7.9*  MG  --  1.6*  --   --   AST 62*  --  63* 52*  ALT 33  --  31 30  ALKPHOS 77  --  69 92  BILITOT 0.4  --  0.6 0.9   ------------------------------------------------------------------------------------------------------------------ estimated creatinine clearance is 7.1 mL/min (by C-G formula based on SCr of 10.24 mg/dL (H)). ------------------------------------------------------------------------------------------------------------------ No results for input(s): HGBA1C in the last 72 hours. ------------------------------------------------------------------------------------------------------------------ No results for input(s): CHOL, HDL, LDLCALC, TRIG, CHOLHDL, LDLDIRECT in the last 72 hours. ------------------------------------------------------------------------------------------------------------------ No results for input(s): TSH, T4TOTAL, T3FREE, THYROIDAB in the last 72 hours.  Invalid input(s): FREET3 ------------------------------------------------------------------------------------------------------------------  Recent Labs  04/11/16 1408  TIBC NOT CALCULATED  IRON 17*    Coagulation profile  Recent Labs Lab 03/31/2016 2244  INR 1.34    No results for input(s): DDIMER in the last 72 hours.  Cardiac Enzymes No results for input(s): CKMB, TROPONINI, MYOGLOBIN in the last 168 hours.  Invalid input(s): CK ------------------------------------------------------------------------------------------------------------------ Invalid input(s): POCBNP   CBG:  Recent Labs Lab 04/11/16 0810 04/11/16 1315 04/11/16 1756 04/11/16 2158 04/12/16 0747  GLUCAP 82 88 94 112* 126*        Studies: Dg Thoracic Spine 2 View  Result Date: 04/12/2016 CLINICAL DATA:  70 year old male with history of back pain and weight loss. EXAM: THORACIC SPINE 2 VIEWS COMPARISON:  No priors. FINDINGS: There is no evidence of thoracic spine fracture. Alignment is normal. Mild multilevel degenerative disc disease, typical for the patient's age. No other significant bone abnormalities are identified. Aortic atherosclerosis. Vascular stent projecting over the left axillary region, incompletely visualized. IMPRESSION: 1. No acute radiographic abnormality of the thoracic spine. 2. Aortic atherosclerosis. Electronically Signed   By: Vinnie Langton M.D.   On: 04/12/2016 15:19   Dg Lumbar Spine 2-3 Views  Result Date: 04/12/2016 CLINICAL DATA:  Back pain lumbar region. EXAM: LUMBAR SPINE - 2-3 VIEW COMPARISON:  04/04/2013 FINDINGS: Vertebral body alignment, heights and disc space heights are normal. There is mild spondylosis throughout the lumbar spine to include facet arthropathy over the lower lumbar spine. No compression fracture or subluxation. Moderate calcified plaque over the abdominal aorta and iliac arteries IMPRESSION: Mild spondylosis of the lumbar spine to include facet arthropathy. Electronically Signed   By: Marin Olp M.D.   On: 04/12/2016 15:25   Dg Pelvis 1-2 Views  Result Date: 04/12/2016 CLINICAL DATA:  Low back pain. Unable to obtain history from patient. EXAM: PELVIS - 1-2 VIEW COMPARISON:  None. FINDINGS: Mild symmetric degenerative change of the hips. Mild diffuse decreased bone mineralization is present. There is no acute fracture or dislocation. Skin staples are present over the soft tissues of the left hip region. Several pelvic phleboliths. Calcified plaque over the iliac and femoral arteries. IMPRESSION: No  acute findings. Electronically Signed   By: Marin Olp M.D.   On: 04/12/2016 15:26      Lab Results  Component Value Date   HGBA1C 5.5 04/01/2016   HGBA1C  6.3 (H) 08/11/2013   Lab Results  Component Value Date   CREATININE 10.24 (H) 04/11/2016       Scheduled Meds: . aspirin EC  81 mg Oral Daily  . darbepoetin (ARANESP) injection - DIALYSIS  60 mcg Intravenous Q Sat-HD  . famotidine (PEPCID) IV  20 mg Intravenous Q24H  . feeding supplement (NEPRO CARB STEADY)  237 mL Oral BID BM  . fluconazole (DIFLUCAN) IV  200 mg Intravenous Q24H  . glipiZIDE  2.5 mg Oral QAC breakfast  . heparin subcutaneous  5,000 Units Subcutaneous Q8H  . ipratropium  2 spray Each Nare BID  . mouth rinse  15 mL Mouth Rinse BID  . multivitamin  1 tablet Oral QHS  . sodium chloride flush  3 mL Intravenous Q12H   Continuous Infusions:     LOS: 4 days    Time spent: >30 MINS    [facility]  [facility] Pager [phone]. If 7PM-7AM, please contact night-coverage at [email or web address], password [facility] 04/13/2016, 8:07 AM  LOS: 4 days

## 2016-04-13 NOTE — Care Management Important Message (Signed)
Important Message  Patient Details  Name: Victor Lane MRN: [id number] Date of Birth: 10-18-1945   Medicare Important Message Given:  Yes    [name] 04/13/2016, 4:16 PM

## 2016-04-13 NOTE — Procedures (Signed)
I was present at this session.  I have reviewed the session itself and made appropriate changes.  HD via LUA aVF. Access press ok. Aggitated, confused.  Debbra Digiulio L 10/23/20171:37 PM

## 2016-04-13 NOTE — Progress Notes (Addendum)
Pt is confused, disoriented, per pt's wife, no improvement since admission. Dr Allyson Sabal was aware. Brain MRI was done this morning.  1300 Pt to hemodialysis, report was given. 1800 Received pt back from dialysis, oriented to self, confused. C/o back pain, medicated.

## 2016-04-14 ENCOUNTER — Inpatient Hospital Stay (HOSPITAL_COMMUNITY): Payer: Medicare Other

## 2016-04-14 DIAGNOSIS — R1010 Upper abdominal pain, unspecified: Secondary | ICD-10-CM

## 2016-04-14 DIAGNOSIS — K659 Peritonitis, unspecified: Secondary | ICD-10-CM

## 2016-04-14 LAB — CSF CELL COUNT WITH DIFFERENTIAL
RBC Count, CSF: 103 /mm3 — ABNORMAL HIGH
RBC Count, CSF: 775 /mm3 — ABNORMAL HIGH
TUBE #: 4
Tube #: 1
WBC, CSF: 1 /mm3 (ref 0–5)
WBC, CSF: 3 /mm3 (ref 0–5)

## 2016-04-14 LAB — CBC
HCT: 34.3 % — ABNORMAL LOW (ref 39.0–52.0)
HEMOGLOBIN: 10.1 g/dL — AB (ref 13.0–17.0)
MCH: 31.5 pg (ref 26.0–34.0)
MCHC: 29.4 g/dL — AB (ref 30.0–36.0)
MCV: 106.9 fL — ABNORMAL HIGH (ref 78.0–100.0)
Platelets: 289 10*3/uL (ref 150–400)
RBC: 3.21 MIL/uL — AB (ref 4.22–5.81)
RDW: 17.8 % — ABNORMAL HIGH (ref 11.5–15.5)
WBC: 10.5 10*3/uL (ref 4.0–10.5)

## 2016-04-14 LAB — CULTURE, BLOOD (ROUTINE X 2)
CULTURE: NO GROWTH
Culture: NO GROWTH

## 2016-04-14 LAB — PROTEIN AND GLUCOSE, CSF
Glucose, CSF: 67 mg/dL (ref 40–70)
TOTAL PROTEIN, CSF: 34 mg/dL (ref 15–45)

## 2016-04-14 LAB — HEPATIC FUNCTION PANEL
ALBUMIN: 1.8 g/dL — AB (ref 3.5–5.0)
ALK PHOS: 92 U/L (ref 38–126)
ALT: 35 U/L (ref 17–63)
AST: 55 U/L — ABNORMAL HIGH (ref 15–41)
Bilirubin, Direct: 0.2 mg/dL (ref 0.1–0.5)
Indirect Bilirubin: 0.6 mg/dL (ref 0.3–0.9)
TOTAL PROTEIN: 5.8 g/dL — AB (ref 6.5–8.1)
Total Bilirubin: 0.8 mg/dL (ref 0.3–1.2)

## 2016-04-14 LAB — BASIC METABOLIC PANEL
ANION GAP: 17 — AB (ref 5–15)
BUN: 22 mg/dL — ABNORMAL HIGH (ref 6–20)
CALCIUM: 8.3 mg/dL — AB (ref 8.9–10.3)
CO2: 21 mmol/L — ABNORMAL LOW (ref 22–32)
Chloride: 100 mmol/L — ABNORMAL LOW (ref 101–111)
Creatinine, Ser: 5.3 mg/dL — ABNORMAL HIGH (ref 0.61–1.24)
GFR, EST AFRICAN AMERICAN: 11 mL/min — AB (ref >60)
GFR, EST NON AFRICAN AMERICAN: 10 mL/min — AB (ref >60)
Glucose, Bld: 112 mg/dL — ABNORMAL HIGH (ref 65–99)
Potassium: 3.9 mmol/L (ref 3.5–5.1)
SODIUM: 138 mmol/L (ref 135–145)

## 2016-04-14 LAB — LIPASE, BLOOD: LIPASE: 51 U/L (ref 11–51)

## 2016-04-14 MED ORDER — KETOROLAC TROMETHAMINE 15 MG/ML IJ SOLN: 15.0000 mg | Freq: Once | INTRAMUSCULAR | Status: AC

## 2016-04-14 MED ORDER — HEPARIN SODIUM (PORCINE) 5000 UNIT/ML IJ SOLN
5000.0000 [IU] | Freq: Three times a day (TID) | INTRAMUSCULAR | Status: DC
  Administered 2016-04-14 – 2016-04-19 (×11): 5000 [IU] via SUBCUTANEOUS
  Filled 2016-04-14 (×12): qty 1

## 2016-04-14 MED ORDER — PANTOPRAZOLE SODIUM 40 MG IV SOLR
40.0000 mg | Freq: Every day | INTRAVENOUS | Status: DC
  Administered 2016-04-14: 40 mg via INTRAVENOUS
  Filled 2016-04-14: qty 40

## 2016-04-14 MED ORDER — IOPAMIDOL (ISOVUE-300) INJECTION 61%
INTRAVENOUS | Status: AC
  Administered 2016-04-14: 100 mL
  Filled 2016-04-14: qty 100

## 2016-04-14 MED ORDER — KETOROLAC TROMETHAMINE 15 MG/ML IJ SOLN
15.0000 mg | Freq: Once | INTRAMUSCULAR | Status: AC
  Administered 2016-04-14: 15 mg via INTRAVENOUS

## 2016-04-14 MED ORDER — KETOROLAC TROMETHAMINE 15 MG/ML IJ SOLN
INTRAMUSCULAR | Status: AC
  Administered 2016-04-14: 15 mg via INTRAVENOUS
  Filled 2016-04-14: qty 1

## 2016-04-14 MED ORDER — ACETAMINOPHEN 325 MG PO TABS
ORAL_TABLET | ORAL | Status: AC
  Filled 2016-04-14: qty 2

## 2016-04-14 NOTE — Procedures (Signed)
Lumbar Puncture Procedure Note  Pre-operative Diagnosis: r/o meningitis  Post-operative Diagnosis: r/o menigitis  Indications: Diagnostic  Procedure Details   Consent: Informed consent was obtained. Risks of the procedure were discussed including: infection, bleeding, pain and headache.  The patient was positioned under sterile conditions. Betadine solution and sterile drapes were utilized. A spinal needle was inserted at the L3 - L4 interspace.  Spinal fluid was obtained and sent to the laboratory.  Findings 66mL of clear spinal fluid was obtained. Opening Pressure: wnlcm H2O pressure. Closing Pressure: wnl cm H2O pressure.  Complications:  None; patient tolerated the procedure well.        Condition: stable  Plan Bed rest for 5 hours. Tylenol 650 mg for pain.was traumatic mild then cleared  Lavon Paganini. Titus Mould, MD, [place] Pgr: [facility]

## 2016-04-14 NOTE — Progress Notes (Signed)
[facility] PROGRESS NOTE  RAN SOWELLS [id number] DOB: 1946/06/09 DOA: 03/31/2016   PCP: [name], MD     Assessment/Plan: Principal Problem:   Peritonitis ([place]) Active Problems:   HTN (hypertension)   Anemia of chronic disease   Continuous ambulatory peritoneal dialysis status ([place])   ESRD on dialysis ([place])   Dilated aortic root (HCC)   CAD (coronary artery disease), native coronary artery   Dyslipidemia   Chronic systolic congestive heart failure (HCC)   Carotid stenosis   Abdominal pain   Diabetes mellitus with complication (HCC)   Hypokalemia   Acute encephalopathy     70 y.o. 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  He presents to ED sent by renal MD for PD cath removal for refractory culture negative peritonitis. Per chart last ECHO shows EF 30%. Hypotensive on arrival. Possible small left lower lobe infiltrate.    Assessment and plan Peritonitis (HCC)/left lower lobe pneumonia / sepsis:  his ascites fluid cultures from 10/11 was negative, but has high cell count in fluid, indicating persistent or recurrent peritonitis. Pt was suspected to have sepsis on presentation with elevated lactate, tachycardia, tachypnea and hypotension. Blood pressure responded to IV fluid resuscitation,   Nephrology, Roney Jaffe, MD  was consulted.  Status pos removal of PD cath by general surgery  10/20   Hemodynamics have improved Currently on vancomycin, Zosyn since 10/19, along  with  Diflucan [outpatient culture suspicious for   Yeast , identification pending] Duration of treatment-may need infectious disease consult Blood culture from 10/19 negative, culture from peritoneal washings 10/11, 10/8  Negative Severe abdominal pain with HD , will order stat CT to r/o  perforation,abcess etc, check LFT's , lipase    Acute metabolic encephalopathy:  continues to have intermittent confusion Need to rule out ongoing infection, repeat chest x-ray to evaluate left pleural effusion Continue to treat underlying infection MRI of the brain to rule out CVA, patient needs a lumbar puncture , as per nephrology Ammonia level within normal limits   ESRD-on PD: None hemodialysis per Dr. Jonnie Finner,   Transitioned PD to HD,AVF functional L arm. HD as per nephrology recommendations  clipped to [facility]   Hypokalemia: K= 2.7, resolved Repleted   CAD: s/p of stent. No CP -continue aspirin per rectal, IV metoprolol  HTN: -hold oral meds -IV hydralazine when necessary and IV metoprolol with holding parameters.  Chronic systolic congestive heart failure: 2-D echo on 10/08/14 showed EF 30-35 percent. Patient does not have leg edema or JVD. CHF seems to be compensated on admission. Pt is not on diuretics at home. Volume status being managed with hemodialysis -Watch volume status closely  DM-II: Last A1c 5.5, well controled. Patient is taking glipizide at home CBGs have remained stable, will DC Accu-Cheks   Back pain-patient's wife states that patient had a recent fall  Plain films show spondylosis of the lumbar spine, no fracture  Dysphagia-patient deemed to be high risk for aspiration, will keep the patient NPO except meds, patient has had difficulty swallowing medications,MBS swallow pending. Previous history of radiation therapy to vocal cords   DVT prophylaxsis heparin  Code Status:  Full code    Family Communication: Discussed in detail with the patient's wife ,  all imaging results, lab results explained to the patient   Disposition   lumbar puncture, CT abdomen     Consultants:  General surgery  Nephrology  Procedures:  None  Antibiotics: Vancomycin 10/19 - Zosyn 10/19 - Diflucan 10/19-      HPI/Subjective: Severe abdominal pain,  moaning and groaning     Objective: Vitals:   04/13/16 1810 04/13/16 2130 04/14/16 0525 04/14/16 0801  BP: (!) 144/76 (!) 146/71 (!) 149/71 (!) 152/82  Pulse: 85 (!) 108 (!) 108 (!) 113  Resp:  17 17 (!) 21  Temp:  98 F (36.7 C) 98.2 F (36.8 C) 97.9 F (36.6 C)  TempSrc:  Oral Oral Oral  SpO2:  97% 97% 96%  Weight:    79.1 kg (174 lb 6.1 oz)  Height:        Intake/Output Summary (Last 24 hours) at 04/14/16 0813 Last data filed at 04/14/16 0600  Gross per 24 hour  Intake              160 ml  Output             1000 ml  Net             -840 ml    Exam:  Examination:  General exam: Confused  Respiratory system: Clear to auscultation. Respiratory effort normal. Cardiovascular system: S1 & S2 heard, RRR. No JVD, murmurs, rubs, gallops or clicks. No pedal edema. Gastrointestinal system: Abdomen is nondistended, soft and nontender. No organomegaly or masses felt. Normal bowel sounds heard. Central nervous system:  . No focal neurological deficits. Extremities: Symmetric 5 x 5 power. Skin: No rashes, lesions or ulcers Psychiatry: Judgement and insight appear normal. Mood & affect appropriate.     Data Reviewed: I have personally reviewed following labs and imaging studies  Micro Results Recent Results (from the past 240 hour(s))  C difficile quick scan w PCR reflex     Status: None   Collection Time: 04/16/2016  4:07 PM  Result Value Ref Range Status   C Diff antigen NEGATIVE NEGATIVE Final   C Diff toxin NEGATIVE NEGATIVE Final   C Diff interpretation No C. difficile detected.  Final  Gastrointestinal Panel by PCR , Stool     Status: None   Collection Time: 04/08/2016  4:07 PM  Result Value Ref Range Status   Campylobacter species NOT DETECTED NOT DETECTED Final   Plesimonas shigelloides NOT DETECTED NOT DETECTED Final   Salmonella species NOT DETECTED NOT DETECTED Final   Yersinia enterocolitica NOT DETECTED NOT DETECTED Final   Vibrio species NOT DETECTED NOT  DETECTED Final   Vibrio cholerae NOT DETECTED NOT DETECTED Final   Enteroaggregative E coli (EAEC) NOT DETECTED NOT DETECTED Final   Enteropathogenic E coli (EPEC) NOT DETECTED NOT DETECTED Final   Enterotoxigenic E coli (ETEC) NOT DETECTED NOT DETECTED Final   Shiga like toxin producing E coli (STEC) NOT DETECTED NOT DETECTED Final   Shigella/Enteroinvasive E coli (EIEC) NOT DETECTED NOT DETECTED Final   Cryptosporidium NOT DETECTED NOT DETECTED Final   Cyclospora cayetanensis NOT DETECTED NOT DETECTED Final   Entamoeba histolytica NOT DETECTED NOT DETECTED Final   Giardia lamblia NOT DETECTED NOT DETECTED Final   Adenovirus F40/41 NOT DETECTED NOT DETECTED Final   Astrovirus NOT DETECTED NOT DETECTED Final   Norovirus GI/GII NOT DETECTED NOT DETECTED Final   Rotavirus A NOT DETECTED NOT DETECTED Final   Sapovirus (I, II, IV, and V) NOT DETECTED NOT DETECTED Final  Blood Culture (routine x 2)     Status: None (Preliminary result)   Collection Time: 03/26/2016  4:17 PM  Result Value Ref Range Status   Specimen Description BLOOD RIGHT HAND  Final   Special Requests BOTTLES DRAWN AEROBIC AND ANAEROBIC 5CC  Final   Culture NO GROWTH 4 DAYS  Final   Report Status PENDING  Incomplete  Blood Culture (routine x 2)     Status: None (Preliminary result)   Collection Time: 03/31/2016  4:23 PM  Result Value Ref Range Status   Specimen Description BLOOD LEFT ANTECUBITAL  Final   Special Requests BOTTLES DRAWN AEROBIC AND ANAEROBIC 5CC  Final   Culture NO GROWTH 4 DAYS  Final   Report Status PENDING  Incomplete  MRSA PCR Screening     Status: None   Collection Time: 04/06/2016 10:29 AM  Result Value Ref Range Status   MRSA by PCR NEGATIVE NEGATIVE Final    Comment:        The GeneXpert MRSA Assay (FDA approved for NASAL specimens only), is one component of a comprehensive MRSA colonization surveillance program. It is not intended to diagnose MRSA infection nor to guide or monitor treatment  for MRSA infections.     Radiology Reports Dg Chest 1 View  Result Date: 04/13/2016 CLINICAL DATA:  Sharp chest pain EXAM: CHEST 1 VIEW COMPARISON:  03/24/2016 FINDINGS: Cardiomegaly and congested vascular appearance. Trace bilateral pleural fluid or thickening. Extensive atherosclerosis of the aorta. Multiple stents of the left axilla. IMPRESSION: Cardiomegaly and pulmonary venous congestion. Electronically Signed   By: Monte Fantasia M.D.   On: 04/13/2016 10:05   Dg Chest 2 View  Result Date: 04/01/2016 CLINICAL DATA:  Suspected sepsis.  Peritoneal dialysis patient. EXAM: CHEST  2 VIEW COMPARISON:  Chest radiograph 07/19/2015.  Abdominal CT 03/28/2016 FINDINGS: Stable cardiomegaly and mediastinal contours. There is thoracic aortic atherosclerosis. Small bilateral pleural effusions and bibasilar airspace disease. No pulmonary edema. No pneumothorax. Vascular stent in the left axilla. Free air under the right hemidiaphragm, likely related to peritoneal dialysis. Resorption of the distal right clavicle. IMPRESSION: Chronic cardiomegaly, stable.  Thoracic aortic atherosclerosis. Small bilateral pleural effusions with bibasilar airspace disease, likely atelectasis. Electronically Signed   By: Jeb Levering M.D.   On: 04/01/2016 05:40   Dg Thoracic Spine 2 View  Result Date: 04/12/2016 CLINICAL DATA:  70 year old male with history of back pain and weight loss. EXAM: THORACIC SPINE 2 VIEWS COMPARISON:  No priors. FINDINGS: There is no evidence of thoracic spine fracture. Alignment is normal. Mild multilevel degenerative disc disease, typical for the patient's age. No other significant bone abnormalities are identified. Aortic atherosclerosis. Vascular stent projecting over the left axillary region, incompletely visualized. IMPRESSION: 1. No acute radiographic abnormality of the thoracic spine. 2. Aortic atherosclerosis. Electronically Signed   By: Vinnie Langton M.D.   On: 04/12/2016 15:19   Dg  Lumbar Spine 2-3 Views  Result Date: 04/12/2016 CLINICAL DATA:  Back pain lumbar region. EXAM: LUMBAR SPINE - 2-3 VIEW COMPARISON:  04/04/2013 FINDINGS: Vertebral body alignment, heights and disc space heights are normal. There is mild spondylosis throughout the lumbar spine to include facet arthropathy over the lower lumbar spine. No compression fracture or subluxation. Moderate calcified plaque over the abdominal aorta and iliac arteries IMPRESSION: Mild spondylosis of the lumbar spine to include facet arthropathy. Electronically Signed   By: Marin Olp M.D.   On: 04/12/2016 15:25   Dg Pelvis 1-2 Views  Result Date: 04/12/2016 CLINICAL DATA:  Low back pain. Unable to obtain history from patient. EXAM: PELVIS - 1-2 VIEW COMPARISON:  None. FINDINGS: Mild symmetric degenerative change of the hips. Mild diffuse decreased bone mineralization is present. There is no acute fracture or dislocation. Skin staples are present over the soft tissues of the left hip region. Several pelvic phleboliths. Calcified plaque over the iliac and femoral arteries. IMPRESSION: No acute findings. Electronically Signed   By: Marin Olp M.D.   On: 04/12/2016 15:26   Mr Brain Wo Contrast  Result Date: 04/13/2016 CLINICAL DATA:  Altered mental status. EXAM: MRI HEAD WITHOUT CONTRAST TECHNIQUE: Multiplanar, multiecho pulse sequences of the brain and surrounding structures were obtained without intravenous contrast. COMPARISON:  None FINDINGS: Brain: Motion degraded exam. Only sagittal T1 weighted imaging, axial diffusion, axial T2, and axial FLAIR sequences could be obtained before patient refused to continue. No acute infarct, hemorrhage, hydrocephalus, or swelling. Small-vessel ischemic change in the deep cerebral white matter, mild for age. Remote lacunar infarct in the left caudate. Mild generalized cerebral volume loss. Vascular: Normal flow voids. Skull and upper cervical spine: Normal marrow signal. Sinuses/Orbits:  Chronic sinusitis including complete opacification of the right maxillary antrum with inspissated secretions. Mild mucosal thickening in the mastoids. IMPRESSION: 1. No acute finding, including infarct. 2. Generalized atrophy and mild chronic microvascular ischemic change. 3. Partial, motion degraded exam. Electronically Signed   By: Monte Fantasia M.D.   On: 04/13/2016 10:41   Dg Chest Left Decubitus  Result Date: 04/13/2016 CLINICAL DATA:  Sharp chest pain. EXAM: CHEST - LEFT DECUBITUS COMPARISON:  Chest x-ray from earlier today FINDINGS: Trace bilateral pleural fluid or thickening. No significant layering fluid on left side down decubitus. No right-sided pneumothorax is noted. Cardiomegaly and vascular congestion. IMPRESSION: Left side down decubitus shows no significant layering pleural fluid or right-sided pneumothorax. Electronically Signed   By: Monte Fantasia M.D.   On: 04/13/2016 10:04   Ct Abdomen Pelvis W Contrast  Result Date: 03/29/2016 CLINICAL DATA:  Acute onset of generalized abdominal pain. Recent peritoneal dialysis. Vomiting and diarrhea. Initial encounter. EXAM: CT ABDOMEN AND PELVIS WITH CONTRAST TECHNIQUE: Multidetector CT imaging of the abdomen and pelvis was performed using the standard protocol following bolus administration of intravenous contrast. CONTRAST:  156mL ISOVUE-300 IOPAMIDOL (ISOVUE-300) INJECTION 61% COMPARISON:  CT of the abdomen and pelvis from 11/26/2014 FINDINGS: Lower chest: Trace right-sided pleural fluid is noted. Bibasilar atelectasis or scarring is seen. The visualized portions of the mediastinum are unremarkable. Hepatobiliary: A small amount of ascites is seen tracking about the liver. The liver is unremarkable in appearance. The patient is status post cholecystectomy, with clips noted at the gallbladder fossa. The common bile duct remains normal in caliber. Pancreas: The pancreas is within normal limits. Spleen: The spleen is unremarkable in appearance.  Adrenals/Urinary Tract: The adrenal glands are grossly unremarkable in appearance. Severe chronic bilateral renal atrophy is noted, with scattered bilateral renal cysts. There is no evidence of hydronephrosis. No renal or ureteral stones are identified. No significant perinephric stranding is seen. Stomach/Bowel: The stomach is unremarkable in appearance. The small bowel is within normal limits. The appendix is normal in caliber, without evidence of appendicitis. Scattered diverticulosis is noted along the distal descending and sigmoid colon, without definite evidence of diverticulitis. Vascular/Lymphatic: Diffuse calcification is seen along the abdominal aorta and its branches. There is relatively severe luminal narrowing at the proximal right common iliac artery, and moderate luminal narrowing along the left common iliac artery. Relatively severe luminal narrowing is also  noted along the proximal left superficial femoral artery. No retroperitoneal or pelvic sidewall lymphadenopathy is seen. Reproductive: The bladder is mildly distended and grossly unremarkable. The prostate remains normal in size. Other: The patient's peritoneal dialysis catheter is noted within the pelvis, with a small to moderate amount of free fluid in the pelvis and along both paracolic gutters. Musculoskeletal: No acute osseous abnormalities are identified. The visualized musculature is unremarkable in appearance. IMPRESSION: 1. No acute abnormality seen to explain the patient's symptoms. 2. Status post recent peritoneal dialysis. Small to moderate volume ascites noted within the abdomen and pelvis. 3. Diffuse aortic atherosclerosis noted. Relatively severe luminal narrowing at the proximal right common iliac artery, and moderate luminal narrowing along the left common iliac artery. Relatively severe luminal narrowing also noted along the proximal left superficial femoral artery. 4. Severe chronic bilateral renal atrophy, with scattered  bilateral renal cysts. 5. Scattered diverticulosis along the distal descending and sigmoid colon, without definite evidence of diverticulitis. 6. Trace right-sided pleural fluid. Bibasilar atelectasis or scarring noted. Electronically Signed   By: Garald Balding M.D.   On: 03/29/2016 00:10   Dg Chest Port 1 View  Result Date: 04/06/2016 CLINICAL DATA:  Sepsis.  History of head and neck cancer. EXAM: PORTABLE CHEST 1 VIEW COMPARISON:  Chest radiograph April 01, 2016 FINDINGS: Cardiac silhouette is mildly enlarged unchanged. Heavily calcified aortic. Diffuse interstitial prominence. Worsening retrocardiac consolidation and small LEFT pleural effusion. RIGHT lung base strandy densities. No pneumothorax. Surgical clips LEFT neck, stent projects in RIGHT neck and LEFT subclavian vessels. Postsurgical changes RIGHT distal clavicle. Bibasilar strandy densities IMPRESSION: Stable cardiomegaly. Worsening retrocardiac consolidation with small LEFT pleural effusion. RIGHT lung base atelectasis/ scarring. Worsening bronchitic changes. Electronically Signed   By: Elon Alas M.D.   On: 04/10/2016 19:40   Ct Angio Abdomen Pelvis  W &/or Wo Contrast  Result Date: 04/03/2016 CLINICAL DATA:  70 year old male with abdominal pain. Negative lactic acidosis. Renal failure with peritoneal dialysis catheter. EXAM: CT ANGIOGRAPHY  ABDOMEN AND PELVIS TECHNIQUE: Multidetector CT imaging through the abdomen and pelvis was performed using the standard protocol during bolus administration of intravenous contrast. Delayed images included. Multiplanar reconstructed images including MIPs were obtained and reviewed to evaluate the vascular anatomy. CONTRAST:  100 cc Isovue 370 COMPARISON:  CT angiogram done the previous day, CT 03/28/2016, CT 11/26/2014 FINDINGS: VASCULAR Aorta: Extensive mixed calcified and soft plaque of the lower thoracic and abdominal aorta. No aneurysm. No dissection. No periaortic fluid. Celiac: Similar  appearance of atherosclerotic changes at the origin of the celiac artery, which is patent though perhaps with 50% or greater stenosis. Post stenotic dilation. Typical branch pattern again noted, discussed on the prior CT. SMA: Similar stenotic appearance of the superior mesenteric artery with mixed calcified and soft plaque. Greater than 50% stenosis. Distal branches appear patent. Renals: Calcified and soft plaque at the origin the bilateral renal arteries. Single bilateral renal arteries. IMA: Patent IMA. Likely stenosis at the origin. Difficult to quantify degree of stenosis. Right lower extremity: Mixed calcified and soft plaque of the right iliac system. Again, likely greater than 50% stenosis in the proximal right common iliac artery. Hypogastric artery and external iliac artery patent. Atherosclerotic disease throughout the external iliac artery. Common femoral artery in the proximal full more all vessels patent. Left lower extremity: Mixed calcified and soft plaque of the left iliac system. Likely stenosis at the distal common iliac artery. Hypogastric artery and external iliac artery patent. Disease through the length of the  external iliac artery. Common femoral artery in the proximal fill more 0 vasculature patent on today's exam. Veins: Portal venous system remains patent. Unremarkable appearance of the systemic venous vasculature. Review of the MIP images confirms the above findings. NON-VASCULAR Lower chest: Respiratory motion somewhat limits evaluation of the lung bases. Coverage of the lower chest/ field-of-view larger on the current study. Plaque like thickening of the left sided pleura at the lung base. This has been present since the CT 11/26/2014 Coronary calcifications.  Aortic valve calcifications. Hepatobiliary: Morphology of the liver compatible with cirrhosis with enlarged left lobe, caudate lobe, and nodular configuration. No focal lesion identified. Pancreas: Unremarkable appearance of  pancreas Spleen: Borderline enlarged spleen Adrenals/Urinary Tract: Unremarkable appearance of adrenal glands. Right: Severe renal cortical thinning. No hydronephrosis or nephrolithiasis. Hypodense lesion on the lateral cortex and at the superior cortex too small to characterize. Left: Severe renal cortical thinning. No hydronephrosis. Similar appearance of low-density cystic structures, the larger of which are compatible with simple cysts. Smaller are too small to characterize. Excreted contrast within the urinary bladder. Stomach/Bowel: Unremarkable stomach. Small hiatal hernia. No evidence of small bowel obstruction or transition point. Minimal thickening of small bowel wall, persists after the prior CT. Small bowel wall enhances on the arterial and venous phase, with no CT evidence of ischemia. Retained oral contrast within colon, limiting evaluation of the mucosal surface. No evidence of obstruction. Small stool burden. Diverticular disease. Normal appendix. Lymphatic: Low-density ascites within the pericolic gutter, right upper quadrant, and within the dependent pelvis associated with the peritoneal dialysis catheter. No enhancement of the surface is the peritoneum and. Vascular/lymphatic congestion within small bowel mesenteric, similar to prior. Small lymph nodes, none of which are enlarged. Mesenteric: Fluid throughout the peritoneal of small volume. Small amount of anti dependent gas, similar to prior. Peritoneal dialysis catheter terminates within the low pelvis, adjacent to the rectum. Reproductive: Unremarkable appearance of the pelvic organs. Other: Small fat containing umbilical hernia. Musculoskeletal: Sclerotic appearance of the skeletal elements. No acute bony abnormality. IMPRESSION: Vascular: No acute vascular abnormality. Similar to yesterday's study there is diffuse atherosclerotic changes of the abdominal aorta, with involvement of the mesenteric vasculature. Again, there is likely greater  than 50% stenosis at the celiac artery and IMA artery, however, CTA resolution difficult to make precise assessment. The degree of stenosis at the SMA origin is likely high-grade given the post stenotic dilation and degree of disease. Distal branches of the mesenteric vessels remain patent. Bilateral renal artery stenosis. Portal venous system unremarkable. Systemic venous system unremarkable. Nonvascular: Morphology of the liver compatible with cirrhosis. Associated borderline splenomegaly, indicating developing portal hypertension. No evidence of bowel ischemia, with uniform enhancement of stomach and small bowel. Although the colon wall cannot be assessed with the presence of positive enteric contrast, there are no focal findings to suggest colonic ischemia. Temporal resolution of mild small bowel thickening is improved with 24 hour follow-up CT, with mild thickening of the small bowel in the mid abdomen. There is associated lymphatic congestion of the small bowel mesenteric with associated small lymph nodes. Finding is nonspecific. There is no evidence of venous obstruction per se, although these findings can be seen with portal colopathy/enteropathy in the setting of cirrhosis and developing portal hypertension. Alternative consideration would be an nonspecific inflammatory or infectious enteritis. These results were called by telephone at the time of interpretation on 04/03/2016 at 8:56 am to Dr. Carles Collet, who verbally acknowledged these results. Small volume of low-density ascites, likely related to  PD catheter. PD catheter within the dependent pelvis adjacent to rectum. No evidence of peritonitis on the contrast-enhanced exam. Persistent thickening of the left posterior lower pleura, unchanged from the CT 11/26/2014. This may reflect rounded atelectasis/scarring. Further evaluation with chest CT may be considered if the patient has a history of carcinoma. Coronary artery disease.  Aortic valve calcifications.  Signed, Dulcy Fanny. Earleen Newport, DO [facility] Radiology Electronically Signed   By: Corrie Mckusick D.O.   On: 04/03/2016 08:59   Ct Angio Abd/pel W And/or Wo Contrast  Result Date: 04/01/2016 CLINICAL DATA:  70 year old male with a history of lower abdominal pain and nausea vomiting. Constipation. EXAM: CTA ABDOMEN AND PELVIS WITH CONTRAST TECHNIQUE: Multidetector CT imaging of the abdomen and pelvis was performed using the standard protocol during bolus administration of intravenous contrast. Multiplanar reconstructed images and MIPs were obtained and reviewed to evaluate the vascular anatomy. CONTRAST:  75 cc Isovue 370 COMPARISON:  CT 03/28/2016, 11/26/2014, 08/06/2013 FINDINGS: VASCULAR Aorta: Diffuse atherosclerosis of the abdominal aorta with soft plaque and calcified plaque. No dissection. No aneurysm. No periaortic fluid. Celiac: Atherosclerotic changes at the origin of the celiac artery which is patent. Post stenotic dilation. Typical branch pattern of the celiac artery, with left gastric, common hepatic, and splenic artery identified. SMA: Atherosclerotic changes of the superior mesenteric artery, with mixed calcified and soft plaque at the origin. The SMA appears patent, with greater than 50% stenosis. Renals: Dense calcified and soft plaque at the origin the bilateral renal arteries. Renal arteries are patent. Single left and single right renal artery. IMA: Inferior mesenteric artery is patent, though appears stenotic at the origin. Left colic artery patent. Superior rectal artery patent. Right lower extremity: Dense calcified and soft plaque involving the right iliac system, including common iliac artery and external iliac artery. Small caliber vessels, with likely multifocal stenoses. Greatest degree of stenosis may be at the distal common iliac artery above the hypogastric origin. Hypogastric artery is patent. Common femoral artery patent. Proximal SFA  and profunda femoris patent. Left lower extremity: Mixed calcified and soft plaque of the left iliac system. Greatest degree of stenosis at the origin of the external iliac artery. Hypogastric artery patent. Common femoral artery patent. Proximal SFA and profunda femoris patent. Veins: Unremarkable appearance of the venous system. Review of the MIP images confirms the above findings. NON-VASCULAR Lower chest: Atelectatic changes at the lung bases. Small hiatal hernia. Hepatobiliary: Morphologic changes of the liver compatible with cirrhosis with developing left hepatic enlargement. Nodular contour. No focal lesion identified on the current CT. No intrahepatic or extrahepatic biliary ductal dilatation. Cholecystectomy Pancreas: Unremarkable appearance of the pancreas. No pericholecystic fluid or inflammatory changes. Unremarkable ductal system. Spleen: Borderline splenomegaly. Adrenals/Urinary Tract: Unremarkable appearance of adrenal glands. Right: No hydronephrosis. Advanced cortical renal thinning. Symmetric perfusion to the left. Hypodense lesions within the cortex of the right kidney, the majority are too small to characterize though arm most likely benign cysts. Left: No hydronephrosis. Advanced cortical renal thinning come symmetric to the right. Symmetric perfusion. Cystic lesions in the cortex of the left kidney, most likely all benign cysts although the majority are too small to characterize. Unremarkable appearance of the urinary bladder with high density excreted contrast. Stomach/Bowel: Unremarkable appearance of the stomach. Small hiatal hernia. Unremarkable appearance of small bowel. Venous phase demonstrates enhancement of the small bowel and colon, although the colon somewhat limited by the presence of enteric contrast. Colonic diverticula. Lymphatic: Multiple lymph nodes in the  para-aortic nodal station, none of which are enlarged. Mesenteric: Low-density free fluid within the abdomen, associated  with peritoneal dialysis catheter. Small volume of free air in the anti dependent aspect. Reproductive: Unremarkable appearance of the prostate and pelvic organs. Other: Small fat containing umbilical hernia. Unremarkable appearance of the tract of the dialysis catheter. Musculoskeletal: No evidence of acute fracture. Developing sclerotic appearance of the skeletal structures, potentially early renal osteodystrophy. No bony canal narrowing. No significant degenerative changes of the hips. IMPRESSION: VASCULAR No acute vascular abnormality identified. Advanced aortic atherosclerosis. There are associated changes at the origin of all mesenteric vessel, though they remain patent. There may be 50 percent stenosis at the celiac artery origin, with greater than 50 percent stenosis of the superior mesenteric artery origin, and potentially greater than 50 percent stenosis at the origin of the IMA, although degree of stenosis cannot be determined by CTA. This pattern of disease certainly places the patient at risk for chronic mesenteric ischemia. Correlation with patient's history may be useful, and potentially mesenteric duplex exam if there is concern for compromise. Bilateral renal artery stenosis, potentially contributing to advanced renal cortical thinning. NON-VASCULAR Morphologic changes of the liver compatible with cirrhosis. Referral for establishing gastroenterology care may be considered if not already established, as well as consideration of initiating surveillance for hepatocellular carcinoma. Peritoneal dialysis catheter with free fluid likely associated with treatment. Tiny amount of anti dependent gas within the abdomen, again likely associated with the peritoneal dialysis. Again, bilateral renal cortical cysts. While these are most likely benign, there is incomplete characterization given the size. Developing renal osteodystrophy. Signed, Dulcy Fanny. Earleen Newport, DO [facility]  [facility] Radiology Electronically Signed   By: Corrie Mckusick D.O.   On: 04/01/2016 09:00     CBC  Recent Labs Lab 04/18/2016 1623 04/07/2016 0512 04/11/16 0343 04/13/16 0554  WBC 10.4 13.7* 12.5* 10.2  HGB 8.5* 8.1* 8.4* 9.4*  HCT 27.8* 26.7* 27.9* 31.2*  PLT 236 207 219 259  MCV 104.9* 105.1* 104.9* 106.1*  MCH 32.1 31.9 31.6 32.0  MCHC 30.6 30.3 30.1 30.1  RDW 17.5* 17.3* 17.4* 17.4*  LYMPHSABS 0.7  --   --   --   MONOABS 0.5  --   --   --   EOSABS 1.5*  --   --   --   BASOSABS 0.1  --   --   --     Chemistries   Recent Labs Lab 04/03/2016 1623 04/18/2016 2244 04/21/2016 0512 04/11/16 0343 04/13/16 0554 04/14/16 0403  NA 132*  --  132* 135 138 138  K 2.7*  --  3.5 3.8 4.4 3.9  CL 93*  --  103 104 102 100*  CO2 23  --  18* 18* 18* 21*  GLUCOSE 71  --  61* 90 135* 112*  BUN 35*  --  33* 42* 40* 22*  CREATININE 9.50*  --  9.11* 10.24* 8.69* 5.30*  CALCIUM 8.9  --  7.8* 7.9* 8.5* 8.3*  MG  --  1.6*  --   --  2.0  --   AST 62*  --  63* 52* 41  --   ALT 33  --  31 30 32  --   ALKPHOS 77  --  69 92 79  --   BILITOT 0.4  --  0.6 0.9 1.2  --    ------------------------------------------------------------------------------------------------------------------ estimated creatinine clearance is 13.8 mL/min (by C-G formula based on SCr of 5.3 mg/dL (H)). ------------------------------------------------------------------------------------------------------------------ No  results for input(s): HGBA1C in the last 72 hours. ------------------------------------------------------------------------------------------------------------------ No results for input(s): CHOL, HDL, LDLCALC, TRIG, CHOLHDL, LDLDIRECT in the last 72 hours. ------------------------------------------------------------------------------------------------------------------ No results for input(s): TSH, T4TOTAL, T3FREE, THYROIDAB in the last 72 hours.  Invalid input(s):  FREET3 ------------------------------------------------------------------------------------------------------------------  Recent Labs  04/11/16 1408  TIBC NOT CALCULATED  IRON 17*    Coagulation profile  Recent Labs Lab 04/08/2016 2244  INR 1.34    No results for input(s): DDIMER in the last 72 hours.  Cardiac Enzymes No results for input(s): CKMB, TROPONINI, MYOGLOBIN in the last 168 hours.  Invalid input(s): CK ------------------------------------------------------------------------------------------------------------------ Invalid input(s): POCBNP   CBG:  Recent Labs Lab 04/11/16 0810 04/11/16 1315 04/11/16 1756 04/11/16 2158 04/12/16 0747  GLUCAP 82 88 94 112* 126*       Studies: Dg Chest 1 View  Result Date: 04/13/2016 CLINICAL DATA:  Sharp chest pain EXAM: CHEST 1 VIEW COMPARISON:  03/31/2016 FINDINGS: Cardiomegaly and congested vascular appearance. Trace bilateral pleural fluid or thickening. Extensive atherosclerosis of the aorta. Multiple stents of the left axilla. IMPRESSION: Cardiomegaly and pulmonary venous congestion. Electronically Signed   By: Monte Fantasia M.D.   On: 04/13/2016 10:05   Dg Thoracic Spine 2 View  Result Date: 04/12/2016 CLINICAL DATA:  70 year old male with history of back pain and weight loss. EXAM: THORACIC SPINE 2 VIEWS COMPARISON:  No priors. FINDINGS: There is no evidence of thoracic spine fracture. Alignment is normal. Mild multilevel degenerative disc disease, typical for the patient's age. No other significant bone abnormalities are identified. Aortic atherosclerosis. Vascular stent projecting over the left axillary region, incompletely visualized. IMPRESSION: 1. No acute radiographic abnormality of the thoracic spine. 2. Aortic atherosclerosis. Electronically Signed   By: Vinnie Langton M.D.   On: 04/12/2016 15:19   Dg Lumbar Spine 2-3 Views  Result Date: 04/12/2016 CLINICAL DATA:  Back pain lumbar region. EXAM:  LUMBAR SPINE - 2-3 VIEW COMPARISON:  04/04/2013 FINDINGS: Vertebral body alignment, heights and disc space heights are normal. There is mild spondylosis throughout the lumbar spine to include facet arthropathy over the lower lumbar spine. No compression fracture or subluxation. Moderate calcified plaque over the abdominal aorta and iliac arteries IMPRESSION: Mild spondylosis of the lumbar spine to include facet arthropathy. Electronically Signed   By: Marin Olp M.D.   On: 04/12/2016 15:25   Dg Pelvis 1-2 Views  Result Date: 04/12/2016 CLINICAL DATA:  Low back pain. Unable to obtain history from patient. EXAM: PELVIS - 1-2 VIEW COMPARISON:  None. FINDINGS: Mild symmetric degenerative change of the hips. Mild diffuse decreased bone mineralization is present. There is no acute fracture or dislocation. Skin staples are present over the soft tissues of the left hip region. Several pelvic phleboliths. Calcified plaque over the iliac and femoral arteries. IMPRESSION: No acute findings. Electronically Signed   By: Marin Olp M.D.   On: 04/12/2016 15:26   Mr Brain Wo Contrast  Result Date: 04/13/2016 CLINICAL DATA:  Altered mental status. EXAM: MRI HEAD WITHOUT CONTRAST TECHNIQUE: Multiplanar, multiecho pulse sequences of the brain and surrounding structures were obtained without intravenous contrast. COMPARISON:  None FINDINGS: Brain: Motion degraded exam. Only sagittal T1 weighted imaging, axial diffusion, axial T2, and axial FLAIR sequences could be obtained before patient refused to continue. No acute infarct, hemorrhage, hydrocephalus, or swelling. Small-vessel ischemic change in the deep cerebral white matter, mild for age. Remote lacunar infarct in the left caudate. Mild generalized cerebral volume loss. Vascular: Normal flow voids. Skull and upper cervical spine:  Normal marrow signal. Sinuses/Orbits: Chronic sinusitis including complete opacification of the right maxillary antrum with inspissated  secretions. Mild mucosal thickening in the mastoids. IMPRESSION: 1. No acute finding, including infarct. 2. Generalized atrophy and mild chronic microvascular ischemic change. 3. Partial, motion degraded exam. Electronically Signed   By: Monte Fantasia M.D.   On: 04/13/2016 10:41   Dg Chest Left Decubitus  Result Date: 04/13/2016 CLINICAL DATA:  Sharp chest pain. EXAM: CHEST - LEFT DECUBITUS COMPARISON:  Chest x-ray from earlier today FINDINGS: Trace bilateral pleural fluid or thickening. No significant layering fluid on left side down decubitus. No right-sided pneumothorax is noted. Cardiomegaly and vascular congestion. IMPRESSION: Left side down decubitus shows no significant layering pleural fluid or right-sided pneumothorax. Electronically Signed   By: Monte Fantasia M.D.   On: 04/13/2016 10:04      Lab Results  Component Value Date   HGBA1C 5.5 04/01/2016   HGBA1C 6.3 (H) 08/11/2013   Lab Results  Component Value Date   CREATININE 5.30 (H) 04/14/2016       Scheduled Meds: . aspirin EC  81 mg Oral Daily  . darbepoetin (ARANESP) injection - DIALYSIS  60 mcg Intravenous Q Sat-HD  . famotidine  20 mg Oral Daily  . feeding supplement (NEPRO CARB STEADY)  237 mL Oral BID BM  . fluconazole (DIFLUCAN) IV  200 mg Intravenous Q24H  . glipiZIDE  2.5 mg Oral QAC breakfast  . heparin subcutaneous  5,000 Units Subcutaneous Q8H  . ipratropium  2 spray Each Nare BID  . mouth rinse  15 mL Mouth Rinse BID  . multivitamin  1 tablet Oral QHS  . sodium chloride flush  3 mL Intravenous Q12H   Continuous Infusions:     LOS: 5 days    Time spent: >30 MINS    [facility]  [facility] Pager [phone]. If 7PM-7AM, please contact night-coverage at [email or web address], password [facility] 04/14/2016, 8:13 AM  LOS: 5 days

## 2016-04-14 NOTE — Progress Notes (Signed)
Pharmacy Antibiotic Note  Victor Lane is a 70 y.o. male admitted on 03/28/2016 with intra-abdominal infection.  Peritonitis- fluid cx neg but high cell count.  AFeb.  Better than 10/21. Some confusion.  Dr Jonnie Finner PN:  outpt  PD fluid gm stain (+)yeast & cx growing "mold". Plan for LP today. Pt is currently on HD TTS  Antimicrobials this admission:  Zosyn 10/19 >.10/22 Vanc 10/19 >> 10/22 Fluconazole 10/19 >>   Dose adjustments this admission:  N/A  Microbiology results:  10/19 BCx: ngtd  10/19 UCx: ngtd 10/19 GI PCR: neg  10/19 C Diff: neg  10/18 outpt peritoneal cx: + for yeast, mold, PD cath removed 10/20 10/19  Peritoneal dialysate:  sent 10/20 MRSA PCR: neg 10/24 CSF   Plan: Fluconazole 200 IV q24h at night F/u cultures   Height: 5\' 11"  (180.3 cm) Weight: 174 lb 6.1 oz (79.1 kg) IBW/kg (Calculated) : 75.3  Temp (24hrs), Avg:98 F (36.7 C), Min:97.9 F (36.6 C), Max:98.2 F (36.8 C)   Recent Labs Lab 04/15/2016 1623 04/11/2016 1639 04/16/2016 2006 04/03/2016 2244 04/17/2016 0056 04/11/2016 0512 04/11/16 0343 04/13/16 0554 04/14/16 0403 04/14/16 0800  WBC 10.4  --   --   --   --  13.7* 12.5* 10.2  --  10.5  CREATININE 9.50*  --   --   --   --  9.11* 10.24* 8.69* 5.30*  --   LATICACIDVEN  --  3.66* 1.41 1.4 1.1  --   --   --   --   --     Estimated Creatinine Clearance: 13.8 mL/min (by C-G formula based on SCr of 5.3 mg/dL (H)).   ESRD  Allergies  Allergen Reactions  . Fentanyl Other (See Comments)    Confusion when patch applied  . Nsaids Other (See Comments)    Severe kidney disease/ESRD  . Amoxicillin Diarrhea and Nausea And Vomiting  . Moxifloxacin Diarrhea and Nausea And Vomiting    Thank you for allowing pharmacy to be a part of this patient's care.  Maryanna Shape, PharmD, BCPS  Clinical Pharmacist  Pager: [phone]   04/14/2016

## 2016-04-14 NOTE — Progress Notes (Signed)
Pt. Remained in flat position from 1400 to 1900 per MD orders.

## 2016-04-14 NOTE — Progress Notes (Signed)
SLP Cancellation Note  Patient Details Name: Victor Lane MRN: [id number] DOB: 12-Nov-1945   Cancelled treatment:       Reason Eval/Treat Not Completed: Patient's level of consciousness. Pt given a sedative at the end of HD, before LP. Wife reports mentation through the day has not changed, meaning ability for PO intake still poor. Continue to recommend short term alternative method of nutrition and f/u MBS when pt alert.    Sheryll Dymek, Katherene Ponto 04/14/2016, 3:00 PM

## 2016-04-14 NOTE — Progress Notes (Signed)
[facility] Progress Note  Dialysis Orders:  PD to HD -plan to Clip to [facility] TTS   Assessment/Plan: 1. Peritonitis -admit with high cell count -  OP PD culture drawn 04/08/16 gs positive for yeast/ "mold" isolated PD cath removed 04/15/2016  now on Diflucan IV -  IP blood/peritoneal cultures negative to date - abx d/c'd fungal ID pending Get results 2. ESRD - Cont HD on TTS schedule -  3. Anemia - Hgb 10.1 -Aranesp 60 mcg 10/21 ^ESA - plan next dose 10/28 4. Mineral bone disease - Corr Ca 10.4 P 4.7 - OP iPTH 139 / No binders No VDRA  5. HTN/volume -  BP controlled/ Serial HD for volume - Now below OP EDW of 82 kg post HD 79.3 kg  lower EDW at d/c  6. Nutrition - Alb 1.6 with PD and peritonitis poor PO intake during admit/on protein supp 7. AMS/Encephalopahty - brain MRI on 10/23 - no acute findings . Improving 8. CAD - per primary on aspirin 9. DM Type II - per primary   Lynnda Child PA-C [facility] Pager [phone] 04/14/2016,8:31 AM  LOS: 5 days  I have seen and examined this patient and agree with the plan of care seen, examined, eval, discussed with primary, PA .  Calogero Geisen L 04/14/2016, 9:48 AM   Subjective: Seen on HD more alert today but still confused   Objective Vitals:   04/14/16 0525 04/14/16 0801 04/14/16 0806 04/14/16 0812  BP: (!) 149/71 (!) 152/82 (!) 148/72 (!) 142/71  Pulse: (!) 108 (!) 113 (!) 112 (!) 110  Resp: 17 (!) 21    Temp: 98.2 F (36.8 C) 97.9 F (36.6 C)    TempSrc: Oral Oral    SpO2: 97% 96%    Weight:  79.1 kg (174 lb 6.1 oz)    Height:       Physical Exam General: Frail appearing elderly WM more alert today NAD still confused Heart: RRR Gr 2/6 M Lungs: CTAB breathing non labored  Abdomen: soft ND NT PD site clean, no drainage Extremities: no edema Dialysis Access: LUE AVF cannulated on HD   Additional Objective Labs: Basic Metabolic Panel:  Recent Labs Lab 04/11/16 0343 04/13/16 0554  04/14/16 0403  NA 135 138 138  K 3.8 4.4 3.9  CL 104 102 100*  CO2 18* 18* 21*  GLUCOSE 90 135* 112*  BUN 42* 40* 22*  CREATININE 10.24* 8.69* 5.30*  CALCIUM 7.9* 8.5* 8.3*  PHOS  --  4.7*  --    Liver Function Tests:  Recent Labs Lab 04/09/2016 0512 04/11/16 0343 04/13/16 0554  AST 63* 52* 41  ALT 31 30 32  ALKPHOS 69 92 79  BILITOT 0.6 0.9 1.2  PROT 4.8* 5.4* 5.7*  ALBUMIN 1.5* 1.4* 1.6*    Recent Labs Lab 03/25/2016 1623  LIPASE 22   CBC:  Recent Labs Lab 03/25/2016 1623 04/13/2016 0512 04/11/16 0343 04/13/16 0554 04/14/16 0800  WBC 10.4 13.7* 12.5* 10.2 10.5  NEUTROABS 7.6  --   --   --   --   HGB 8.5* 8.1* 8.4* 9.4* 10.1*  HCT 27.8* 26.7* 27.9* 31.2* 34.3*  MCV 104.9* 105.1* 104.9* 106.1* 106.9*  PLT 236 207 219 259 289   Blood Culture    Component Value Date/Time   SDES BLOOD LEFT ANTECUBITAL 04/13/2016 1623   SPECREQUEST BOTTLES DRAWN AEROBIC AND ANAEROBIC 5CC 03/22/2016 1623   CULT NO GROWTH 4 DAYS 04/15/2016 1623   REPTSTATUS PENDING  04/04/2016 1623    Cardiac Enzymes: No results for input(s): CKTOTAL, CKMB, CKMBINDEX, TROPONINI in the last 168 hours. CBG:  Recent Labs Lab 04/11/16 0810 04/11/16 1315 04/11/16 1756 04/11/16 2158 04/12/16 0747  GLUCAP 82 88 94 112* 126*   Iron Studies:  Recent Labs  04/11/16 1408  IRON 17*  TIBC NOT CALCULATED   Lab Results  Component Value Date   INR 1.34 03/29/2016   INR 1.23 04/02/2016   INR 1.01 08/21/2014   Medications:   . aspirin EC  81 mg Oral Daily  . darbepoetin (ARANESP) injection - DIALYSIS  60 mcg Intravenous Q Sat-HD  . famotidine  20 mg Oral Daily  . feeding supplement (NEPRO CARB STEADY)  237 mL Oral BID BM  . fluconazole (DIFLUCAN) IV  200 mg Intravenous Q24H  . glipiZIDE  2.5 mg Oral QAC breakfast  . heparin subcutaneous  5,000 Units Subcutaneous Q8H  . ipratropium  2 spray Each Nare BID  . mouth rinse  15 mL Mouth Rinse BID  . multivitamin  1 tablet Oral QHS  . sodium  chloride flush  3 mL Intravenous Q12H

## 2016-04-14 NOTE — Procedures (Signed)
I was present at this session.  I have reviewed the session itself and made appropriate changes.  Hd via LUA AVF.  2 needles up and <2 FBs apart, discussed.  Flows 200s.  bp tol HD.  Viveka Wilmeth L 10/24/20178:40 AM

## 2016-04-15 ENCOUNTER — Encounter (HOSPITAL_COMMUNITY): Payer: Self-pay | Admitting: General Practice

## 2016-04-15 DIAGNOSIS — M545 Low back pain: Secondary | ICD-10-CM

## 2016-04-15 DIAGNOSIS — Z515 Encounter for palliative care: Secondary | ICD-10-CM

## 2016-04-15 DIAGNOSIS — R100 Acute abdomen: Secondary | ICD-10-CM

## 2016-04-15 DIAGNOSIS — R1013 Epigastric pain: Secondary | ICD-10-CM

## 2016-04-15 LAB — PROTIME-INR
INR: 1.19
Prothrombin Time: 15.2 seconds (ref 11.4–15.2)

## 2016-04-15 LAB — HERPES SIMPLEX VIRUS(HSV) DNA BY PCR
HSV 1 DNA: NEGATIVE
HSV 2 DNA: NEGATIVE

## 2016-04-15 LAB — VDRL, CSF: VDRL Quant, CSF: NONREACTIVE

## 2016-04-15 MED ORDER — MORPHINE SULFATE (PF) 2 MG/ML IV SOLN
2.0000 mg | Freq: Once | INTRAVENOUS | Status: DC
  Filled 2016-04-15: qty 1

## 2016-04-15 MED ORDER — LORAZEPAM 2 MG/ML IJ SOLN
2.0000 mg | Freq: Once | INTRAMUSCULAR | Status: AC
  Administered 2016-04-15: 2 mg via INTRAMUSCULAR
  Filled 2016-04-15: qty 1

## 2016-04-15 NOTE — Progress Notes (Signed)
[facility] PROGRESS NOTE  ABDELAZIZ BURTS [id number] DOB: 04-04-1946 DOA: 03/23/2016   PCP: [name], MD     Assessment/Plan: Principal Problem:   Peritonitis ([place]) Active Problems:   HTN (hypertension)   Anemia of chronic disease   Continuous ambulatory peritoneal dialysis status ([place])   ESRD on dialysis ([place])   Dilated aortic root (HCC)   CAD (coronary artery disease), native coronary artery   Dyslipidemia   Chronic systolic congestive heart failure (HCC)   Carotid stenosis   Abdominal pain   Diabetes mellitus with complication (HCC)   Hypokalemia   Acute encephalopathy     70 y.o. 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  He presents to ED sent by renal MD for PD cath removal for refractory culture negative peritonitis. Per chart last ECHO shows EF 30%. Hypotensive on arrival. Possible small left lower lobe infiltrate.    Assessment and plan Peritonitis (HCC)/left lower lobe pneumonia / sepsis:  his ascites fluid cultures from 10/11 was negative, but has high cell count in fluid, indicating persistent or recurrent peritonitis. Pt was suspected to have sepsis on presentation with elevated lactate, tachycardia, tachypnea and hypotension. Blood pressure responded to IV fluid resuscitation,   Nephrology, Roney Jaffe, MD  was consulted.  Status pos removal of PD cath by general surgery  10/20   Hemodynamics have improved Currently on vancomycin, Zosyn with hemodialysis since 10/19, along  with  Diflucan [outpatient culture suspicious for   Yeast , identification pending] Duration of treatment-may need infectious disease consult Blood culture from 10/19 negative, culture from peritoneal washings 10/11, 10/8  Negative     Abdominal pain Severe abdominal pain with HD  10/25 ,   CT 10/24 Spleen shows  9 mm somewhat wedge-shaped peripheral opacity,question small infarct. Patent splenic and portal veins, nl LFT's , lipase  -Etiology unclear, but suspect this is multifactorial including recent infectious gastroenteritis, constipation, peritonitis, possible mesenteric ischemia Evaluated by  vascular surgery 10/11 --low suspicion of mesenteric ischemia causing abdominal pain -04/01/2016 CT angiogram abdomen--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, celiac origin, IMA -03/28/2016 and 04/01/2016 CTs of the abdomen negative for abscess -04/02/16 CT angio abd--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, celiac origin, IMA unchanged--?SB thickening related to PD fluid/cirrhosis   seen by GI ([facility]) 10/13- did not feel there was any intraabd castastrophe--continue medical therapy  seen by general surgery consult--no indication for exploratory laparoscopy   -04/04/16--pt and wife wanted to leave AMA--discussed with Dr. Jobie Quaker ceftazidime 2g and follow up 04/06/16 with home teaching at dialysis center  given findings of a small infarct in the spleen , will order 2 d echo   Acute metabolic encephalopathy:  continues to have intermittent confusion Need to rule out ongoing infection, repeat chest x-ray to evaluate left pleural effusion Continue to treat underlying infection MRI of the brain to rule out CVA, patient needs a lumbar puncture , as per nephrology Ammonia level within normal limits LP negative so far , await CSF fluid cx    ESRD-on PD: None hemodialysis per Dr. Jonnie Finner,   Transitioned PD to HD,AVF functional L arm. HD as per nephrology recommendations  clipped to [facility]   Hypokalemia: K= 2.7, resolved Repleted   CAD: s/p of stent. No CP -  continue aspirin per rectal, IV metoprolol  HTN: -hold oral meds -IV hydralazine when necessary and IV metoprolol with holding parameters.  Chronic systolic congestive heart failure: 2-D echo on 10/08/14 showed EF 30-35 percent.  Patient does not have leg edema or JVD. CHF seems to be compensated on admission. Pt is not on diuretics at home. Volume status being managed with hemodialysis Repeat echo to r/o embolic source    DM-II: Last A1c 5.5, well controled. Patient is taking glipizide at home CBGs have remained stable, will DC Accu-Cheks   Back pain-patient's wife states that patient had a recent fall Plain films show spondylosis of the lumbar spine, no fracture   Dysphagia-patient deemed to be high risk for aspiration, will keep the patient NPO except meds, patient has had difficulty swallowing medications,MBS swallow pending. Previous history of radiation therapy to vocal cords     DVT prophylaxsis heparin  Code Status:  Full code    Family Communication: Discussed in detail with the patient's wife , all imaging results, lab results explained to the patient   Disposition   remains inpatient, palliative care consult for goals of care given   multiple comorbidities and no signs of improvement     Consultants:  General surgery  Nephrology  Procedures:  None  Antibiotics: Anti-infectives    Start     Dose/Rate Route Frequency Ordered Stop   04/14/16 1200  vancomycin (VANCOCIN) IVPB 1000 mg/200 mL premix  Status:  Discontinued     1,000 mg 200 mL/hr over 60 Minutes Intravenous Every T-Th-Sa (Hemodialysis) 04/11/16 1447 04/12/16 1259   04/11/16 1800  vancomycin (VANCOCIN) IVPB 1000 mg/200 mL premix     1,000 mg 200 mL/hr over 60 Minutes Intravenous  Once 04/11/16 1447 04/11/16 2022   04/11/16 1330  piperacillin-tazobactam (ZOSYN) IVPB 3.375 g  Status:  Discontinued     3.375 g 12.5 mL/hr over 240 Minutes Intravenous Every 12 hours 04/11/16 1315 04/12/16 1259   04/11/16 1000  piperacillin-tazobactam (ZOSYN) IVPB 3.375 g  Status:  Discontinued     3.375 g 12.5 mL/hr over 240 Minutes Intravenous Every 12 hours 04/11/16 0353 04/11/16 1315   04/08/2016 2300  fluconazole (DIFLUCAN) IVPB 200 mg      200 mg 100 mL/hr over 60 Minutes Intravenous Every 24 hours 04/12/2016 2232     03/26/2016 2200  piperacillin-tazobactam (ZOSYN) IVPB 2.25 g  Status:  Discontinued     2.25 g 100 mL/hr over 30 Minutes Intravenous Every 6 hours 03/31/2016 1622 04/11/16 0353   04/17/2016 1615  piperacillin-tazobactam (ZOSYN) IVPB 3.375 g     3.375 g 100 mL/hr over 30 Minutes Intravenous  Once 04/07/2016 1609 04/02/2016 1756   04/12/2016 1615  vancomycin (VANCOCIN) 1,500 mg in sodium chloride 0.9 % 500 mL IVPB     1,500 mg 250 mL/hr over 120 Minutes Intravenous  Once 04/07/2016 1609 04/07/2016 2140          HPI/Subjective:  has no complaint, a little better, wife assesses also, knows day.   Objective: Vitals:   04/14/16 1213 04/14/16 1442 04/14/16 2044 04/15/16 0450  BP: 133/69 (!) 142/75 (!) 141/64 137/60  Pulse: (!) 105 98 (!) 102 (!) 118  Resp: 18 18 18 18   Temp: 98 F (36.7 C) 98.7 F (37.1 C) 98.8 F (37.1 C) 98.6 F (37 C)  TempSrc: Axillary Axillary Oral Oral  SpO2: 100% 100% 100% 100%  Weight: 77 kg (169 lb 12.1 oz)     Height:  Intake/Output Summary (Last 24 hours) at 04/15/16 0802 Last data filed at 04/14/16 1213  Gross per 24 hour  Intake                0 ml  Output             1878 ml  Net            -1878 ml    Exam:  Examination:  General exam: Confused  Respiratory system: Clear to auscultation. Respiratory effort normal. Cardiovascular system: S1 & S2 heard, RRR. No JVD, murmurs, rubs, gallops or clicks. No pedal edema. Gastrointestinal system: Abdomen is nondistended, soft and nontender. No organomegaly or masses felt. Normal bowel sounds heard. Central nervous system:  . No focal neurological deficits. Extremities: Symmetric 5 x 5 power. Skin: No rashes, lesions or ulcers Psychiatry: Judgement and insight appear normal. Mood & affect appropriate.     Data Reviewed: I have personally reviewed following labs and imaging studies  Micro Results Recent Results (from  the past 240 hour(s))  C difficile quick scan w PCR reflex     Status: None   Collection Time: 04/01/2016  4:07 PM  Result Value Ref Range Status   C Diff antigen NEGATIVE NEGATIVE Final   C Diff toxin NEGATIVE NEGATIVE Final   C Diff interpretation No C. difficile detected.  Final  Gastrointestinal Panel by PCR , Stool     Status: None   Collection Time: 04/07/2016  4:07 PM  Result Value Ref Range Status   Campylobacter species NOT DETECTED NOT DETECTED Final   Plesimonas shigelloides NOT DETECTED NOT DETECTED Final   Salmonella species NOT DETECTED NOT DETECTED Final   Yersinia enterocolitica NOT DETECTED NOT DETECTED Final   Vibrio species NOT DETECTED NOT DETECTED Final   Vibrio cholerae NOT DETECTED NOT DETECTED Final   Enteroaggregative E coli (EAEC) NOT DETECTED NOT DETECTED Final   Enteropathogenic E coli (EPEC) NOT DETECTED NOT DETECTED Final   Enterotoxigenic E coli (ETEC) NOT DETECTED NOT DETECTED Final   Shiga like toxin producing E coli (STEC) NOT DETECTED NOT DETECTED Final   Shigella/Enteroinvasive E coli (EIEC) NOT DETECTED NOT DETECTED Final   Cryptosporidium NOT DETECTED NOT DETECTED Final   Cyclospora cayetanensis NOT DETECTED NOT DETECTED Final   Entamoeba histolytica NOT DETECTED NOT DETECTED Final   Giardia lamblia NOT DETECTED NOT DETECTED Final   Adenovirus F40/41 NOT DETECTED NOT DETECTED Final   Astrovirus NOT DETECTED NOT DETECTED Final   Norovirus GI/GII NOT DETECTED NOT DETECTED Final   Rotavirus A NOT DETECTED NOT DETECTED Final   Sapovirus (I, II, IV, and V) NOT DETECTED NOT DETECTED Final  Blood Culture (routine x 2)     Status: None   Collection Time: 04/08/2016  4:17 PM  Result Value Ref Range Status   Specimen Description BLOOD RIGHT HAND  Final   Special Requests BOTTLES DRAWN AEROBIC AND ANAEROBIC 5CC  Final   Culture NO GROWTH 5 DAYS  Final   Report Status 04/14/2016 FINAL  Final  Blood Culture (routine x 2)     Status: None   Collection Time:  04/17/2016  4:23 PM  Result Value Ref Range Status   Specimen Description BLOOD LEFT ANTECUBITAL  Final   Special Requests BOTTLES DRAWN AEROBIC AND ANAEROBIC 5CC  Final   Culture NO GROWTH 5 DAYS  Final   Report Status 04/14/2016 FINAL  Final  MRSA PCR Screening     Status: None   Collection Time: 04/17/2016  10:29 AM  Result Value Ref Range Status   MRSA by PCR NEGATIVE NEGATIVE Final    Comment:        The GeneXpert MRSA Assay (FDA approved for NASAL specimens only), is one component of a comprehensive MRSA colonization surveillance program. It is not intended to diagnose MRSA infection nor to guide or monitor treatment for MRSA infections.   CSF culture     Status: None (Preliminary result)   Collection Time: 04/14/16 12:54 PM  Result Value Ref Range Status   Specimen Description CSF  Final   Special Requests TUBE 2  Final   Gram Stain   Final    WBC PRESENT, PREDOMINANTLY MONONUCLEAR NO ORGANISMS SEEN CYTOSPIN    Culture PENDING  Incomplete   Report Status PENDING  Incomplete    Radiology Reports Dg Chest 1 View  Result Date: 04/13/2016 CLINICAL DATA:  Sharp chest pain EXAM: CHEST 1 VIEW COMPARISON:  03/28/2016 FINDINGS: Cardiomegaly and congested vascular appearance. Trace bilateral pleural fluid or thickening. Extensive atherosclerosis of the aorta. Multiple stents of the left axilla. IMPRESSION: Cardiomegaly and pulmonary venous congestion. Electronically Signed   By: Monte Fantasia M.D.   On: 04/13/2016 10:05   Dg Chest 2 View  Result Date: 04/01/2016 CLINICAL DATA:  Suspected sepsis.  Peritoneal dialysis patient. EXAM: CHEST  2 VIEW COMPARISON:  Chest radiograph 07/19/2015.  Abdominal CT 03/28/2016 FINDINGS: Stable cardiomegaly and mediastinal contours. There is thoracic aortic atherosclerosis. Small bilateral pleural effusions and bibasilar airspace disease. No pulmonary edema. No pneumothorax. Vascular stent in the left axilla. Free air under the right  hemidiaphragm, likely related to peritoneal dialysis. Resorption of the distal right clavicle. IMPRESSION: Chronic cardiomegaly, stable.  Thoracic aortic atherosclerosis. Small bilateral pleural effusions with bibasilar airspace disease, likely atelectasis. Electronically Signed   By: Jeb Levering M.D.   On: 04/01/2016 05:40   Dg Thoracic Spine 2 View  Result Date: 04/12/2016 CLINICAL DATA:  70 year old male with history of back pain and weight loss. EXAM: THORACIC SPINE 2 VIEWS COMPARISON:  No priors. FINDINGS: There is no evidence of thoracic spine fracture. Alignment is normal. Mild multilevel degenerative disc disease, typical for the patient's age. No other significant bone abnormalities are identified. Aortic atherosclerosis. Vascular stent projecting over the left axillary region, incompletely visualized. IMPRESSION: 1. No acute radiographic abnormality of the thoracic spine. 2. Aortic atherosclerosis. Electronically Signed   By: Vinnie Langton M.D.   On: 04/12/2016 15:19   Dg Lumbar Spine 2-3 Views  Result Date: 04/12/2016 CLINICAL DATA:  Back pain lumbar region. EXAM: LUMBAR SPINE - 2-3 VIEW COMPARISON:  04/04/2013 FINDINGS: Vertebral body alignment, heights and disc space heights are normal. There is mild spondylosis throughout the lumbar spine to include facet arthropathy over the lower lumbar spine. No compression fracture or subluxation. Moderate calcified plaque over the abdominal aorta and iliac arteries IMPRESSION: Mild spondylosis of the lumbar spine to include facet arthropathy. Electronically Signed   By: Marin Olp M.D.   On: 04/12/2016 15:25   Dg Pelvis 1-2 Views  Result Date: 04/12/2016 CLINICAL DATA:  Low back pain. Unable to obtain history from patient. EXAM: PELVIS - 1-2 VIEW COMPARISON:  None. FINDINGS: Mild symmetric degenerative change of the hips. Mild diffuse decreased bone mineralization is present. There is no acute fracture or dislocation. Skin staples are  present over the soft tissues of the left hip region. Several pelvic phleboliths. Calcified plaque over the iliac and femoral arteries. IMPRESSION: No acute findings. Electronically Signed   By:  Marin Olp M.D.   On: 04/12/2016 15:26   Mr Brain Wo Contrast  Result Date: 04/13/2016 CLINICAL DATA:  Altered mental status. EXAM: MRI HEAD WITHOUT CONTRAST TECHNIQUE: Multiplanar, multiecho pulse sequences of the brain and surrounding structures were obtained without intravenous contrast. COMPARISON:  None FINDINGS: Brain: Motion degraded exam. Only sagittal T1 weighted imaging, axial diffusion, axial T2, and axial FLAIR sequences could be obtained before patient refused to continue. No acute infarct, hemorrhage, hydrocephalus, or swelling. Small-vessel ischemic change in the deep cerebral white matter, mild for age. Remote lacunar infarct in the left caudate. Mild generalized cerebral volume loss. Vascular: Normal flow voids. Skull and upper cervical spine: Normal marrow signal. Sinuses/Orbits: Chronic sinusitis including complete opacification of the right maxillary antrum with inspissated secretions. Mild mucosal thickening in the mastoids. IMPRESSION: 1. No acute finding, including infarct. 2. Generalized atrophy and mild chronic microvascular ischemic change. 3. Partial, motion degraded exam. Electronically Signed   By: Monte Fantasia M.D.   On: 04/13/2016 10:41   Dg Chest Left Decubitus  Result Date: 04/13/2016 CLINICAL DATA:  Sharp chest pain. EXAM: CHEST - LEFT DECUBITUS COMPARISON:  Chest x-ray from earlier today FINDINGS: Trace bilateral pleural fluid or thickening. No significant layering fluid on left side down decubitus. No right-sided pneumothorax is noted. Cardiomegaly and vascular congestion. IMPRESSION: Left side down decubitus shows no significant layering pleural fluid or right-sided pneumothorax. Electronically Signed   By: Monte Fantasia M.D.   On: 04/13/2016 10:04   Ct Abdomen Pelvis  W Contrast  Result Date: 04/14/2016 CLINICAL DATA:  Patient is poor historian. History of ascites. Sepsis on present a shin. Tachycardia, tachypnea and hypotension. EXAM: CT ABDOMEN AND PELVIS WITH CONTRAST TECHNIQUE: Multidetector CT imaging of the abdomen and pelvis was performed using the standard protocol following bolus administration of intravenous contrast. CONTRAST:  123mL ISOVUE-300 IOPAMIDOL (ISOVUE-300) INJECTION 61% COMPARISON:  04/02/2016, 04/01/2016, 11/26/2014, 08/06/2013 FINDINGS: Lower chest: Stable appearance of mild right middle lobe subpleural consolidation and fibrosis. Moderate emphysematous disease involving the inferior aspects of the upper lobes and the bilateral lower lobes. Persistent oblong areas of masslike consolidation along the left lower lobe posterior pleural surface and the pleural surface of the lingula laterally. These are similar in appearance compared with 04/02/2016, slightly decreased from 11/26/2014. Dense atherosclerotic vascular disease of the imaged portions of the aorta. Mildly ectatic ascending aorta, measuring up to 3.9 cm, unchanged. Marked coronary artery calcification. Heart size is slightly enlarged. No large pericardial effusion. Mitral valvular calcifications are also noted. Small amount of left greater than right gynecomastia. Hepatobiliary: Nodular contour of the liver, consistent with cirrhosis. No gross focal hepatic abnormalities. Surgical clips in the gallbladder fossa consistent with prior cholecystectomy. No biliary dilatation. Pancreas: Unremarkable. No pancreatic ductal dilatation or surrounding inflammatory changes. Marked vascular calcifications. Spleen: Borderline enlargement of the spleen measuring 12 cm, unchanged. Now seen is a tiny slightly wedge-shaped relatively peripheral hypodensity in the posterior spleen measuring 9 mm. Normal splenic vein and portal vein enhancement. Adrenals/Urinary Tract: Mild nodular thickening of the left adrenal  gland is unchanged. Kidneys are atrophic. No hydronephrosis. Stable hypodense lesion in the mid cortex of right kidney. Stable dominant cyst in the midpole of left kidney. Additional hypodense lesions in the left kidney as before. Stomach/Bowel: Stomach contains fluid. There is no dilated small bowel to suggest an obstruction. There is mild thickening of small bowel loops in the left hemi abdomen, series 2, image number 56. This does not appear significantly changed. No definite intramural  air present within the thickened small bowel loop. There is diverticular disease of the sigmoid colon. Minimal wall thickening. Vascular/Lymphatic: Dense vascular calcifications within the aorta. No aneurysm. No pathologically enlarged mesenteric or pelvic nodes. Reproductive: Prostate is unremarkable. Other: Small amount of abdominal and pelvic ascites, decreased compared to the previous exam. The peritoneal pelvic dialysis catheter has been removed. No free air. Musculoskeletal: Degenerative changes of the spine. No acute osseous abnormality. IMPRESSION: 1. Moderate emphysematous disease at the bilateral lung bases with persistent ovoid areas of consolidation at the pleural surface of the lingula and left lower lobe posteriorly, findings were present dating back to 11/26/2014, query chronic consolidation/atelectasis with pleural based mass(es) also remaining in the differential. 2. Liver cirrhosis. Borderline enlarged spleen. Spleen now demonstrates a 9 mm somewhat wedge-shaped peripheral opacity, question small infarct. Patent splenic and portal veins. 3. Removal of pelvic peritoneal dialysis catheter. There is decreased abdominal and pelvic ascites. 4. Persistent thickened fluid-filled small bowel loops in the left hemi abdomen, possible inflammatory change. No definite intramural air or portal venous gas is present. 5. Atrophic kidney with bilateral hypodense lesions probable cyst as before. 6. Sigmoid diverticulosis with  slight wall thickening of the sigmoid colon. Electronically Signed   By: Donavan Foil M.D.   On: 04/14/2016 18:20   Ct Abdomen Pelvis W Contrast  Result Date: 03/29/2016 CLINICAL DATA:  Acute onset of generalized abdominal pain. Recent peritoneal dialysis. Vomiting and diarrhea. Initial encounter. EXAM: CT ABDOMEN AND PELVIS WITH CONTRAST TECHNIQUE: Multidetector CT imaging of the abdomen and pelvis was performed using the standard protocol following bolus administration of intravenous contrast. CONTRAST:  180mL ISOVUE-300 IOPAMIDOL (ISOVUE-300) INJECTION 61% COMPARISON:  CT of the abdomen and pelvis from 11/26/2014 FINDINGS: Lower chest: Trace right-sided pleural fluid is noted. Bibasilar atelectasis or scarring is seen. The visualized portions of the mediastinum are unremarkable. Hepatobiliary: A small amount of ascites is seen tracking about the liver. The liver is unremarkable in appearance. The patient is status post cholecystectomy, with clips noted at the gallbladder fossa. The common bile duct remains normal in caliber. Pancreas: The pancreas is within normal limits. Spleen: The spleen is unremarkable in appearance. Adrenals/Urinary Tract: The adrenal glands are grossly unremarkable in appearance. Severe chronic bilateral renal atrophy is noted, with scattered bilateral renal cysts. There is no evidence of hydronephrosis. No renal or ureteral stones are identified. No significant perinephric stranding is seen. Stomach/Bowel: The stomach is unremarkable in appearance. The small bowel is within normal limits. The appendix is normal in caliber, without evidence of appendicitis. Scattered diverticulosis is noted along the distal descending and sigmoid colon, without definite evidence of diverticulitis. Vascular/Lymphatic: Diffuse calcification is seen along the abdominal aorta and its branches. There is relatively severe luminal narrowing at the proximal right common iliac artery, and moderate luminal  narrowing along the left common iliac artery. Relatively severe luminal narrowing is also noted along the proximal left superficial femoral artery. No retroperitoneal or pelvic sidewall lymphadenopathy is seen. Reproductive: The bladder is mildly distended and grossly unremarkable. The prostate remains normal in size. Other: The patient's peritoneal dialysis catheter is noted within the pelvis, with a small to moderate amount of free fluid in the pelvis and along both paracolic gutters. Musculoskeletal: No acute osseous abnormalities are identified. The visualized musculature is unremarkable in appearance. IMPRESSION: 1. No acute abnormality seen to explain the patient's symptoms. 2. Status post recent peritoneal dialysis. Small to moderate volume ascites noted within the abdomen and pelvis. 3. Diffuse aortic atherosclerosis noted.  Relatively severe luminal narrowing at the proximal right common iliac artery, and moderate luminal narrowing along the left common iliac artery. Relatively severe luminal narrowing also noted along the proximal left superficial femoral artery. 4. Severe chronic bilateral renal atrophy, with scattered bilateral renal cysts. 5. Scattered diverticulosis along the distal descending and sigmoid colon, without definite evidence of diverticulitis. 6. Trace right-sided pleural fluid. Bibasilar atelectasis or scarring noted. Electronically Signed   By: Garald Balding M.D.   On: 03/29/2016 00:10   Dg Chest Port 1 View  Result Date: 04/17/2016 CLINICAL DATA:  Sepsis.  History of head and neck cancer. EXAM: PORTABLE CHEST 1 VIEW COMPARISON:  Chest radiograph April 01, 2016 FINDINGS: Cardiac silhouette is mildly enlarged unchanged. Heavily calcified aortic. Diffuse interstitial prominence. Worsening retrocardiac consolidation and small LEFT pleural effusion. RIGHT lung base strandy densities. No pneumothorax. Surgical clips LEFT neck, stent projects in RIGHT neck and LEFT subclavian vessels.  Postsurgical changes RIGHT distal clavicle. Bibasilar strandy densities IMPRESSION: Stable cardiomegaly. Worsening retrocardiac consolidation with small LEFT pleural effusion. RIGHT lung base atelectasis/ scarring. Worsening bronchitic changes. Electronically Signed   By: Elon Alas M.D.   On: 04/20/2016 19:40   Ct Angio Abdomen Pelvis  W &/or Wo Contrast  Result Date: 04/03/2016 CLINICAL DATA:  70 year old male with abdominal pain. Negative lactic acidosis. Renal failure with peritoneal dialysis catheter. EXAM: CT ANGIOGRAPHY  ABDOMEN AND PELVIS TECHNIQUE: Multidetector CT imaging through the abdomen and pelvis was performed using the standard protocol during bolus administration of intravenous contrast. Delayed images included. Multiplanar reconstructed images including MIPs were obtained and reviewed to evaluate the vascular anatomy. CONTRAST:  100 cc Isovue 370 COMPARISON:  CT angiogram done the previous day, CT 03/28/2016, CT 11/26/2014 FINDINGS: VASCULAR Aorta: Extensive mixed calcified and soft plaque of the lower thoracic and abdominal aorta. No aneurysm. No dissection. No periaortic fluid. Celiac: Similar appearance of atherosclerotic changes at the origin of the celiac artery, which is patent though perhaps with 50% or greater stenosis. Post stenotic dilation. Typical branch pattern again noted, discussed on the prior CT. SMA: Similar stenotic appearance of the superior mesenteric artery with mixed calcified and soft plaque. Greater than 50% stenosis. Distal branches appear patent. Renals: Calcified and soft plaque at the origin the bilateral renal arteries. Single bilateral renal arteries. IMA: Patent IMA. Likely stenosis at the origin. Difficult to quantify degree of stenosis. Right lower extremity: Mixed calcified and soft plaque of the right iliac system. Again, likely greater than 50% stenosis in the proximal right common iliac artery. Hypogastric artery and external iliac artery patent.  Atherosclerotic disease throughout the external iliac artery. Common femoral artery in the proximal full more all vessels patent. Left lower extremity: Mixed calcified and soft plaque of the left iliac system. Likely stenosis at the distal common iliac artery. Hypogastric artery and external iliac artery patent. Disease through the length of the external iliac artery. Common femoral artery in the proximal fill more 0 vasculature patent on today's exam. Veins: Portal venous system remains patent. Unremarkable appearance of the systemic venous vasculature. Review of the MIP images confirms the above findings. NON-VASCULAR Lower chest: Respiratory motion somewhat limits evaluation of the lung bases. Coverage of the lower chest/ field-of-view larger on the current study. Plaque like thickening of the left sided pleura at the lung base. This has been present since the CT 11/26/2014 Coronary calcifications.  Aortic valve calcifications. Hepatobiliary: Morphology of the liver compatible with cirrhosis with enlarged left lobe, caudate lobe, and nodular configuration. No  focal lesion identified. Pancreas: Unremarkable appearance of pancreas Spleen: Borderline enlarged spleen Adrenals/Urinary Tract: Unremarkable appearance of adrenal glands. Right: Severe renal cortical thinning. No hydronephrosis or nephrolithiasis. Hypodense lesion on the lateral cortex and at the superior cortex too small to characterize. Left: Severe renal cortical thinning. No hydronephrosis. Similar appearance of low-density cystic structures, the larger of which are compatible with simple cysts. Smaller are too small to characterize. Excreted contrast within the urinary bladder. Stomach/Bowel: Unremarkable stomach. Small hiatal hernia. No evidence of small bowel obstruction or transition point. Minimal thickening of small bowel wall, persists after the prior CT. Small bowel wall enhances on the arterial and venous phase, with no CT evidence of  ischemia. Retained oral contrast within colon, limiting evaluation of the mucosal surface. No evidence of obstruction. Small stool burden. Diverticular disease. Normal appendix. Lymphatic: Low-density ascites within the pericolic gutter, right upper quadrant, and within the dependent pelvis associated with the peritoneal dialysis catheter. No enhancement of the surface is the peritoneum and. Vascular/lymphatic congestion within small bowel mesenteric, similar to prior. Small lymph nodes, none of which are enlarged. Mesenteric: Fluid throughout the peritoneal of small volume. Small amount of anti dependent gas, similar to prior. Peritoneal dialysis catheter terminates within the low pelvis, adjacent to the rectum. Reproductive: Unremarkable appearance of the pelvic organs. Other: Small fat containing umbilical hernia. Musculoskeletal: Sclerotic appearance of the skeletal elements. No acute bony abnormality. IMPRESSION: Vascular: No acute vascular abnormality. Similar to yesterday's study there is diffuse atherosclerotic changes of the abdominal aorta, with involvement of the mesenteric vasculature. Again, there is likely greater than 50% stenosis at the celiac artery and IMA artery, however, CTA resolution difficult to make precise assessment. The degree of stenosis at the SMA origin is likely high-grade given the post stenotic dilation and degree of disease. Distal branches of the mesenteric vessels remain patent. Bilateral renal artery stenosis. Portal venous system unremarkable. Systemic venous system unremarkable. Nonvascular: Morphology of the liver compatible with cirrhosis. Associated borderline splenomegaly, indicating developing portal hypertension. No evidence of bowel ischemia, with uniform enhancement of stomach and small bowel. Although the colon wall cannot be assessed with the presence of positive enteric contrast, there are no focal findings to suggest colonic ischemia. Temporal resolution of mild  small bowel thickening is improved with 24 hour follow-up CT, with mild thickening of the small bowel in the mid abdomen. There is associated lymphatic congestion of the small bowel mesenteric with associated small lymph nodes. Finding is nonspecific. There is no evidence of venous obstruction per se, although these findings can be seen with portal colopathy/enteropathy in the setting of cirrhosis and developing portal hypertension. Alternative consideration would be an nonspecific inflammatory or infectious enteritis. These results were called by telephone at the time of interpretation on 04/03/2016 at 8:56 am to Dr. Carles Collet, who verbally acknowledged these results. Small volume of low-density ascites, likely related to PD catheter. PD catheter within the dependent pelvis adjacent to rectum. No evidence of peritonitis on the contrast-enhanced exam. Persistent thickening of the left posterior lower pleura, unchanged from the CT 11/26/2014. This may reflect rounded atelectasis/scarring. Further evaluation with chest CT may be considered if the patient has a history of carcinoma. Coronary artery disease.  Aortic valve calcifications. Signed, Dulcy Fanny. Earleen Newport, DO [facility] Electronically Signed   By: Corrie Mckusick D.O.   On: 04/03/2016 08:59   Ct Angio Abd/pel W And/or Wo Contrast  Result Date: 04/01/2016 CLINICAL DATA:  70 year old male with a  history of lower abdominal pain and nausea vomiting. Constipation. EXAM: CTA ABDOMEN AND PELVIS WITH CONTRAST TECHNIQUE: Multidetector CT imaging of the abdomen and pelvis was performed using the standard protocol during bolus administration of intravenous contrast. Multiplanar reconstructed images and MIPs were obtained and reviewed to evaluate the vascular anatomy. CONTRAST:  75 cc Isovue 370 COMPARISON:  CT 03/28/2016, 11/26/2014, 08/06/2013 FINDINGS: VASCULAR Aorta: Diffuse atherosclerosis of the abdominal aorta  with soft plaque and calcified plaque. No dissection. No aneurysm. No periaortic fluid. Celiac: Atherosclerotic changes at the origin of the celiac artery which is patent. Post stenotic dilation. Typical branch pattern of the celiac artery, with left gastric, common hepatic, and splenic artery identified. SMA: Atherosclerotic changes of the superior mesenteric artery, with mixed calcified and soft plaque at the origin. The SMA appears patent, with greater than 50% stenosis. Renals: Dense calcified and soft plaque at the origin the bilateral renal arteries. Renal arteries are patent. Single left and single right renal artery. IMA: Inferior mesenteric artery is patent, though appears stenotic at the origin. Left colic artery patent. Superior rectal artery patent. Right lower extremity: Dense calcified and soft plaque involving the right iliac system, including common iliac artery and external iliac artery. Small caliber vessels, with likely multifocal stenoses. Greatest degree of stenosis may be at the distal common iliac artery above the hypogastric origin. Hypogastric artery is patent. Common femoral artery patent. Proximal SFA and profunda femoris patent. Left lower extremity: Mixed calcified and soft plaque of the left iliac system. Greatest degree of stenosis at the origin of the external iliac artery. Hypogastric artery patent. Common femoral artery patent. Proximal SFA and profunda femoris patent. Veins: Unremarkable appearance of the venous system. Review of the MIP images confirms the above findings. NON-VASCULAR Lower chest: Atelectatic changes at the lung bases. Small hiatal hernia. Hepatobiliary: Morphologic changes of the liver compatible with cirrhosis with developing left hepatic enlargement. Nodular contour. No focal lesion identified on the current CT. No intrahepatic or extrahepatic biliary ductal dilatation. Cholecystectomy Pancreas: Unremarkable appearance of the pancreas. No pericholecystic fluid  or inflammatory changes. Unremarkable ductal system. Spleen: Borderline splenomegaly. Adrenals/Urinary Tract: Unremarkable appearance of adrenal glands. Right: No hydronephrosis. Advanced cortical renal thinning. Symmetric perfusion to the left. Hypodense lesions within the cortex of the right kidney, the majority are too small to characterize though arm most likely benign cysts. Left: No hydronephrosis. Advanced cortical renal thinning come symmetric to the right. Symmetric perfusion. Cystic lesions in the cortex of the left kidney, most likely all benign cysts although the majority are too small to characterize. Unremarkable appearance of the urinary bladder with high density excreted contrast. Stomach/Bowel: Unremarkable appearance of the stomach. Small hiatal hernia. Unremarkable appearance of small bowel. Venous phase demonstrates enhancement of the small bowel and colon, although the colon somewhat limited by the presence of enteric contrast. Colonic diverticula. Lymphatic: Multiple lymph nodes in the para-aortic nodal station, none of which are enlarged. Mesenteric: Low-density free fluid within the abdomen, associated with peritoneal dialysis catheter. Small volume of free air in the anti dependent aspect. Reproductive: Unremarkable appearance of the prostate and pelvic organs. Other: Small fat containing umbilical hernia. Unremarkable appearance of the tract of the dialysis catheter. Musculoskeletal: No evidence of acute fracture. Developing sclerotic appearance of the skeletal structures, potentially early renal osteodystrophy. No bony canal narrowing. No significant degenerative changes of the hips. IMPRESSION: VASCULAR No acute vascular abnormality identified. Advanced aortic atherosclerosis. There are associated changes at the origin of all mesenteric vessel, though they remain  patent. There may be 50 percent stenosis at the celiac artery origin, with greater than 50 percent stenosis of the superior  mesenteric artery origin, and potentially greater than 50 percent stenosis at the origin of the IMA, although degree of stenosis cannot be determined by CTA. This pattern of disease certainly places the patient at risk for chronic mesenteric ischemia. Correlation with patient's history may be useful, and potentially mesenteric duplex exam if there is concern for compromise. Bilateral renal artery stenosis, potentially contributing to advanced renal cortical thinning. NON-VASCULAR Morphologic changes of the liver compatible with cirrhosis. Referral for establishing gastroenterology care may be considered if not already established, as well as consideration of initiating surveillance for hepatocellular carcinoma. Peritoneal dialysis catheter with free fluid likely associated with treatment. Tiny amount of anti dependent gas within the abdomen, again likely associated with the peritoneal dialysis. Again, bilateral renal cortical cysts. While these are most likely benign, there is incomplete characterization given the size. Developing renal osteodystrophy. Signed, Dulcy Fanny. Earleen Newport, DO [facility] Electronically Signed   By: Corrie Mckusick D.O.   On: 04/01/2016 09:00     CBC  Recent Labs Lab 03/27/2016 1623 03/24/2016 0512 04/11/16 0343 04/13/16 0554 04/14/16 0800  WBC 10.4 13.7* 12.5* 10.2 10.5  HGB 8.5* 8.1* 8.4* 9.4* 10.1*  HCT 27.8* 26.7* 27.9* 31.2* 34.3*  PLT 236 207 219 259 289  MCV 104.9* 105.1* 104.9* 106.1* 106.9*  MCH 32.1 31.9 31.6 32.0 31.5  MCHC 30.6 30.3 30.1 30.1 29.4*  RDW 17.5* 17.3* 17.4* 17.4* 17.8*  LYMPHSABS 0.7  --   --   --   --   MONOABS 0.5  --   --   --   --   EOSABS 1.5*  --   --   --   --   BASOSABS 0.1  --   --   --   --     Chemistries   Recent Labs Lab 04/20/2016 1623 04/06/2016 2244 04/10/16 0512 04/11/16 0343 04/13/16 0554 04/14/16 0403 04/14/16 1209  NA 132*  --  132* 135 138 138  --   K 2.7*  --  3.5  3.8 4.4 3.9  --   CL 93*  --  103 104 102 100*  --   CO2 23  --  18* 18* 18* 21*  --   GLUCOSE 71  --  61* 90 135* 112*  --   BUN 35*  --  33* 42* 40* 22*  --   CREATININE 9.50*  --  9.11* 10.24* 8.69* 5.30*  --   CALCIUM 8.9  --  7.8* 7.9* 8.5* 8.3*  --   MG  --  1.6*  --   --  2.0  --   --   AST 62*  --  63* 52* 41  --  55*  ALT 33  --  31 30 32  --  35  ALKPHOS 77  --  69 92 79  --  92  BILITOT 0.4  --  0.6 0.9 1.2  --  0.8   ------------------------------------------------------------------------------------------------------------------ estimated creatinine clearance is 13.8 mL/min (by C-G formula based on SCr of 5.3 mg/dL (H)). ------------------------------------------------------------------------------------------------------------------ No results for input(s): HGBA1C in the last 72 hours. ------------------------------------------------------------------------------------------------------------------ No results for input(s): CHOL, HDL, LDLCALC, TRIG, CHOLHDL, LDLDIRECT in the last 72 hours. ------------------------------------------------------------------------------------------------------------------ No results for input(s): TSH, T4TOTAL, T3FREE, THYROIDAB in the last 72 hours.  Invalid input(s): FREET3 ------------------------------------------------------------------------------------------------------------------ No results for input(s): VITAMINB12, FOLATE, FERRITIN,  TIBC, IRON, RETICCTPCT in the last 72 hours.  Coagulation profile  Recent Labs Lab 04/03/2016 2244 04/15/16 0340  INR 1.34 1.19    No results for input(s): DDIMER in the last 72 hours.  Cardiac Enzymes No results for input(s): CKMB, TROPONINI, MYOGLOBIN in the last 168 hours.  Invalid input(s): CK ------------------------------------------------------------------------------------------------------------------ Invalid input(s): POCBNP   CBG:  Recent Labs Lab 04/11/16 0810 04/11/16 1315  04/11/16 1756 04/11/16 2158 04/12/16 0747  GLUCAP 82 88 94 112* 126*       Studies: Dg Chest 1 View  Result Date: 04/13/2016 CLINICAL DATA:  Sharp chest pain EXAM: CHEST 1 VIEW COMPARISON:  04/11/2016 FINDINGS: Cardiomegaly and congested vascular appearance. Trace bilateral pleural fluid or thickening. Extensive atherosclerosis of the aorta. Multiple stents of the left axilla. IMPRESSION: Cardiomegaly and pulmonary venous congestion. Electronically Signed   By: Monte Fantasia M.D.   On: 04/13/2016 10:05   Mr Brain Wo Contrast  Result Date: 04/13/2016 CLINICAL DATA:  Altered mental status. EXAM: MRI HEAD WITHOUT CONTRAST TECHNIQUE: Multiplanar, multiecho pulse sequences of the brain and surrounding structures were obtained without intravenous contrast. COMPARISON:  None FINDINGS: Brain: Motion degraded exam. Only sagittal T1 weighted imaging, axial diffusion, axial T2, and axial FLAIR sequences could be obtained before patient refused to continue. No acute infarct, hemorrhage, hydrocephalus, or swelling. Small-vessel ischemic change in the deep cerebral white matter, mild for age. Remote lacunar infarct in the left caudate. Mild generalized cerebral volume loss. Vascular: Normal flow voids. Skull and upper cervical spine: Normal marrow signal. Sinuses/Orbits: Chronic sinusitis including complete opacification of the right maxillary antrum with inspissated secretions. Mild mucosal thickening in the mastoids. IMPRESSION: 1. No acute finding, including infarct. 2. Generalized atrophy and mild chronic microvascular ischemic change. 3. Partial, motion degraded exam. Electronically Signed   By: Monte Fantasia M.D.   On: 04/13/2016 10:41   Dg Chest Left Decubitus  Result Date: 04/13/2016 CLINICAL DATA:  Sharp chest pain. EXAM: CHEST - LEFT DECUBITUS COMPARISON:  Chest x-ray from earlier today FINDINGS: Trace bilateral pleural fluid or thickening. No significant layering fluid on left side down  decubitus. No right-sided pneumothorax is noted. Cardiomegaly and vascular congestion. IMPRESSION: Left side down decubitus shows no significant layering pleural fluid or right-sided pneumothorax. Electronically Signed   By: Monte Fantasia M.D.   On: 04/13/2016 10:04   Ct Abdomen Pelvis W Contrast  Result Date: 04/14/2016 CLINICAL DATA:  Patient is poor historian. History of ascites. Sepsis on present a shin. Tachycardia, tachypnea and hypotension. EXAM: CT ABDOMEN AND PELVIS WITH CONTRAST TECHNIQUE: Multidetector CT imaging of the abdomen and pelvis was performed using the standard protocol following bolus administration of intravenous contrast. CONTRAST:  134mL ISOVUE-300 IOPAMIDOL (ISOVUE-300) INJECTION 61% COMPARISON:  04/02/2016, 04/01/2016, 11/26/2014, 08/06/2013 FINDINGS: Lower chest: Stable appearance of mild right middle lobe subpleural consolidation and fibrosis. Moderate emphysematous disease involving the inferior aspects of the upper lobes and the bilateral lower lobes. Persistent oblong areas of masslike consolidation along the left lower lobe posterior pleural surface and the pleural surface of the lingula laterally. These are similar in appearance compared with 04/02/2016, slightly decreased from 11/26/2014. Dense atherosclerotic vascular disease of the imaged portions of the aorta. Mildly ectatic ascending aorta, measuring up to 3.9 cm, unchanged. Marked coronary artery calcification. Heart size is slightly enlarged. No large pericardial effusion. Mitral valvular calcifications are also noted. Small amount of left greater than right gynecomastia. Hepatobiliary: Nodular contour of the liver, consistent with cirrhosis. No gross focal hepatic abnormalities. Surgical  clips in the gallbladder fossa consistent with prior cholecystectomy. No biliary dilatation. Pancreas: Unremarkable. No pancreatic ductal dilatation or surrounding inflammatory changes. Marked vascular calcifications. Spleen:  Borderline enlargement of the spleen measuring 12 cm, unchanged. Now seen is a tiny slightly wedge-shaped relatively peripheral hypodensity in the posterior spleen measuring 9 mm. Normal splenic vein and portal vein enhancement. Adrenals/Urinary Tract: Mild nodular thickening of the left adrenal gland is unchanged. Kidneys are atrophic. No hydronephrosis. Stable hypodense lesion in the mid cortex of right kidney. Stable dominant cyst in the midpole of left kidney. Additional hypodense lesions in the left kidney as before. Stomach/Bowel: Stomach contains fluid. There is no dilated small bowel to suggest an obstruction. There is mild thickening of small bowel loops in the left hemi abdomen, series 2, image number 56. This does not appear significantly changed. No definite intramural air present within the thickened small bowel loop. There is diverticular disease of the sigmoid colon. Minimal wall thickening. Vascular/Lymphatic: Dense vascular calcifications within the aorta. No aneurysm. No pathologically enlarged mesenteric or pelvic nodes. Reproductive: Prostate is unremarkable. Other: Small amount of abdominal and pelvic ascites, decreased compared to the previous exam. The peritoneal pelvic dialysis catheter has been removed. No free air. Musculoskeletal: Degenerative changes of the spine. No acute osseous abnormality. IMPRESSION: 1. Moderate emphysematous disease at the bilateral lung bases with persistent ovoid areas of consolidation at the pleural surface of the lingula and left lower lobe posteriorly, findings were present dating back to 11/26/2014, query chronic consolidation/atelectasis with pleural based mass(es) also remaining in the differential. 2. Liver cirrhosis. Borderline enlarged spleen. Spleen now demonstrates a 9 mm somewhat wedge-shaped peripheral opacity, question small infarct. Patent splenic and portal veins. 3. Removal of pelvic peritoneal dialysis catheter. There is decreased abdominal and  pelvic ascites. 4. Persistent thickened fluid-filled small bowel loops in the left hemi abdomen, possible inflammatory change. No definite intramural air or portal venous gas is present. 5. Atrophic kidney with bilateral hypodense lesions probable cyst as before. 6. Sigmoid diverticulosis with slight wall thickening of the sigmoid colon. Electronically Signed   By: Donavan Foil M.D.   On: 04/14/2016 18:20      Lab Results  Component Value Date   HGBA1C 5.5 04/01/2016   HGBA1C 6.3 (H) 08/11/2013   Lab Results  Component Value Date   CREATININE 5.30 (H) 04/14/2016       Scheduled Meds: . aspirin EC  81 mg Oral Daily  . darbepoetin (ARANESP) injection - DIALYSIS  60 mcg Intravenous Q Sat-HD  . famotidine  20 mg Oral Daily  . feeding supplement (NEPRO CARB STEADY)  237 mL Oral BID BM  . fluconazole (DIFLUCAN) IV  200 mg Intravenous Q24H  . glipiZIDE  2.5 mg Oral QAC breakfast  . heparin subcutaneous  5,000 Units Subcutaneous Q8H  . ipratropium  2 spray Each Nare BID  . mouth rinse  15 mL Mouth Rinse BID  . multivitamin  1 tablet Oral QHS  . pantoprazole  40 mg Intravenous QHS  . sodium chloride flush  3 mL Intravenous Q12H   Continuous Infusions:     LOS: 6 days    Time spent: >30 MINS    [facility]  [facility] Pager [phone]. If 7PM-7AM, please contact night-coverage at [email or web address], password [facility] 04/15/2016, 8:02 AM  LOS: 6 days

## 2016-04-15 NOTE — Progress Notes (Signed)
SLP Cancellation Note  Patient Details Name: Victor Lane MRN: [id number] DOB: [birth year]   Cancelled treatment:        Attempted to see for po trials. Palliative present and getting ready to meet with pt and wife. Will continue efforts next date.    Houston Siren 04/15/2016, 3:17 PM   [name] Colvin Caroli.[employer] [phone]

## 2016-04-15 NOTE — Consult Note (Signed)
Consultation Note Date: 04/15/2016   Patient Name: Victor Lane  DOB: 1945/09/27  MRN: [id number]  Age / Sex: 70 y.o., male  PCP: Victor Shelter, PA-C Referring Physician: Reyne Dumas, MD  Reason for Consultation: Establishing goals of care  HPI/Patient Profile: 70 y.o. male  with past medical history of vocal cord cancer (s/p radiation therapy 2014, required temp trach), ESRD on PD (started w HD in 2014), carotid art disease, R carotid stent, CAD, ICM with EF 30%, HTN, L arm AVF, diabetes mellitus, recent admission and left AMA 04/04/2016 with abd pain and treated for assumed peritonis although cultures were negative admitted on 04/02/2016 with abdominal pain, AMS, and on renal advice for removal of PD catheter.   Clinical Assessment and Goals of Care: Myself and Victor Court, NP arrived at Victor Lane bedside. He has had a bowel movement and we assist with having him cleaned up. As we were cleaning his wife came to his bedside. We began to discuss with Victor Lane his health status and decline. She says that the past 3 years have been a struggle since his throat cancer but he was doing quite well in her opinion until the past month. She tells [place] that she believes he is improving a little each day. He certainly is speaking and answering questions clearly and appropriately today. He is even making facial expressions and jokes appropriately in conversation which seems like an improvement from what I have reviewed in the chart.   Victor Lane is very much focused on getting him nutrition today. She does mention NGT feeding although he appears more alert and we would like to avoid this if possible. He is still slightly confused. I did call SLP Victor Lane who will come and help evaluate him. I did explain risk of dysphagia and even silent aspiration to wife and reinforced that we should wait for SLP recommendations.   Unclear  if he had painful peritonitis if he was adequately dialyzing at home - could this be causing his decline and possibly could continue to improve with hemodialysis?? Either way our conversation with his wife was clear that she is hopeful for improvement and even expecting of improvement. We will continue to follow and support as we give him and his wife more time to see if he will continue to decline or will improve.   Primary Decision Maker HCPOA wife    Code Status/Advance Care Planning:  Full code - did not address today specifically    Symptom Management:   Agitation: I would recommend haldol 2 mg IV every 6 hours prn instead of Ativan which may sedate him transiently but also contribute to his confusion/delirium.   Pain: Seems restless but also complains of low back pain. Will reassess needs for treating pain tomorrow. This may be contributing to his agitation as well.   Palliative Prophylaxis:   Aspiration, Bowel Regimen, Delirium Protocol and Frequent Pain Assessment  Additional Recommendations (Limitations, Scope, Preferences):  Full Scope Treatment  Psycho-social/Spiritual:   Desire for further Chaplaincy support:no  Additional Recommendations: Caregiving  Support/Resources  Prognosis:   Unable to determine  Discharge Planning: To Be Determined      Primary Diagnoses: Present on Admission: . Peritonitis ([place]) . Abdominal pain . Anemia of chronic disease . CAD (coronary artery disease), native coronary artery . Carotid stenosis . Chronic systolic congestive heart failure ([place]) . Diabetes mellitus with complication ([place]) . Dilated aortic root ([place]) . Dyslipidemia . HTN (hypertension) . Hypokalemia . Acute encephalopathy   I have reviewed the medical record, interviewed the patient and family, and examined the patient. The following aspects are pertinent.  Past Medical History:  Diagnosis Date  . Aortic regurgitation   . Aortic valve sclerosis   .  Arthritis    "just age related" (04/15/2016)  . Carotid artery occlusion    s/p left CEA with 40-59% restenosis and >80% stenosis of the Right ICA - followed by Dr. Kellie Lane  . Cataracts, bilateral   . Chronic combined systolic and diastolic CHF (congestive heart failure) ([place])   . Chronic lower back pain   . Complication of anesthesia    hard to wake up and then panics waking up  . Coronary artery disease    severe CAD with mid RCA occlusion, distal LAD stenosis, moderate disease of the left circ. Repeat cath 07/2014 for worsening LVF showed severe disease of the mid LAD, mild to mod LCX disease, occluded PL of the RCA with left to right collaterals to distal RCA territory s/p PCI of the LAD with DES.  It was noted that if patient develops CP resistant to medical therapy could consider PCI of RCA  . Diastolic dysfunction   . Dilated aortic root ([place])   . ESRD (end stage renal disease) on dialysis [facility])    "will start hemodialysis @ [facility] when discharged; he's having it @ [facility] now" (04/15/2016)  . ESRD on peritoneal dialysis ([place]) 09/2013   "followed by Dr. Moshe Lane; [facility]" (04/15/2016)  . Fibromyalgia    takes tramadol daily as needed  . GERD (gastroesophageal reflux disease)   . H/O vitamin D deficiency   . Hearing loss   . History of blood transfusion 2017   "low HgB" (04/15/2016)  . History of colon polyps   . Hypercholesteremia   . Hypertension   . Myalgia and myositis   . Neuropathy ([place])   . Peripheral neuropathy ([place])   . S/P radiation therapy 01/24/2013-03/14/2013   Larynx/glottis / 63 Gy in 28 fractions  . Sleep apnea    "lost weight and it went away" (04/15/2016)  . Type II diabetes mellitus ([place])   . 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 Marital status: Married    Spouse name: Victor Lane  . Number of children: 1  . Years of education: HS   Occupational  History  .      Self-Employed   Social History Main Topics  . Smoking status: Former Smoker    Packs/day: 2.00    Years: 31.00    Types: Cigarettes    Quit date: 08/07/1995  . Smokeless tobacco: Never Used     Comment: quit smoking in 1997  . Alcohol use No  . Drug use: No  . Sexual activity: Yes   Other Topics Concern  . None   Social History Narrative   Patient lives at home with spouse.   Caffiene Use: 1 cup daily  Family History  Problem Relation Age of Onset  . Adopted: Yes  . Family history unknown: Yes   Scheduled Meds: . aspirin EC  81 mg Oral Daily  . darbepoetin (ARANESP) injection - DIALYSIS  60 mcg Intravenous Q Sat-HD  . famotidine  20 mg Oral Daily  . feeding supplement (NEPRO CARB STEADY)  237 mL Oral BID BM  . fluconazole (DIFLUCAN) IV  200 mg Intravenous Q24H  . glipiZIDE  2.5 mg Oral QAC breakfast  . heparin subcutaneous  5,000 Units Subcutaneous Q8H  . ipratropium  2 spray Each Nare BID  . mouth rinse  15 mL Mouth Rinse BID  . multivitamin  1 tablet Oral QHS  . sodium chloride flush  3 mL Intravenous Q12H   Continuous Infusions:  PRN Meds:.sodium chloride, sodium chloride, acetaminophen, alteplase, heparin, lidocaine (PF), lidocaine-prilocaine, mupirocin ointment, ondansetron, pentafluoroprop-tetrafluoroeth, triamcinolone cream Allergies  Allergen Reactions  . Fentanyl Other (See Comments)    Confusion when patch applied  . Nsaids Other (See Comments)    Severe kidney disease/ESRD  . Amoxicillin Diarrhea and Nausea And Vomiting  . Moxifloxacin Diarrhea and Nausea And Vomiting   Review of Systems  Unable to perform ROS: Mental status change    Physical Exam  Constitutional: He appears well-developed.  HENT:  Head: Normocephalic and atraumatic.  Cardiovascular: Normal rate.   Pulmonary/Chest: Effort normal. No accessory muscle usage. No tachypnea. No respiratory distress.  Abdominal: Soft. Normal appearance.  Neurological: He is alert.  He is disoriented.  Psychiatric: He is agitated. He is not combative. He expresses impulsivity.  Nursing note and vitals reviewed.   Vital Signs: BP 136/62 (BP Location: Left Leg)   Pulse (!) 106   Temp 98.6 F (37 C) (Oral)   Resp 18   Ht 5\' 11"  (1.803 m)   Wt 77 kg (169 lb 12.1 oz)   SpO2 100%   BMI 23.68 kg/m  Pain Assessment: PAINAD   Pain Score: 2    SpO2: SpO2: 100 % O2 Device:SpO2: 100 % O2 Flow Rate: .O2 Flow Rate (L/min): 2 L/min  IO: Intake/output summary:  Intake/Output Summary (Last 24 hours) at 04/15/16 1632 Last data filed at 04/15/16 1428  Gross per 24 hour  Intake                0 ml  Output                0 ml  Net                0 ml    LBM: Last BM Date: 04/15/16 Baseline Weight: Weight: 78 kg (172 lb) Most recent weight: Weight: 77 kg (169 lb 12.1 oz)     Palliative Assessment/Data: PPS: 40%     Time In: 1400 Time Out: 1520 Time Total: 75min Greater than 50%  of this time was spent counseling and coordinating care related to the above assessment and plan.  Signed by: Vinie Sill, NP Palliative Medicine Team Pager # [phone] (M-F 8a-5p) Team Phone # [phone] (Nights/Weekends)

## 2016-04-15 NOTE — Progress Notes (Addendum)
Speech Language Pathology Treatment: Dysphagia  Patient Details Name: Victor Lane MRN: [id number] DOB: 06-27-1945 Today's Date: 04/15/2016 Time: [id number] SLP Time Calculation (min) (ACUTE ONLY): 22 min  Assessment / Plan / Recommendation Clinical Impression  Paged by Palliative NP that wife would like to see if pt able to have po's. SLP discussed/reviewed history of dysphagia following vocal cord cancer s/p radiation 2014. Wife reported pt had not experienced difficulty in the recent past and just started coughing today with liquids. SLP suspected a delayed swallow initiation with thin liquid. No coughing present via cup or straw however need to observe swallow via objective means with MBS with history. Pt has dialysis tomorrow am. SLP will plan on MBS after HD if pt able and adequately alert (wife says he has been this week following HD. Pt may have sips water and ice chips if ok with MD.   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 He presents to ED sent by renal MD for PD cath removal for refractory culture negative peritonitis.Per chart last ECHO shows EF 30%. Hypotensive on arrival. Possible small left lower lobe infiltrate. Found to have recurrent peritonitis, sepsis. Peritoneal catheter removed under surgery on 10/20. Pt has reported dysphagia to family and providers.       SLP Plan  MBS     Recommendations  Diet recommendations:  (sips water, ice chips)                Oral Care Recommendations: Oral care QID Follow up Recommendations: 24 hour supervision/assistance Plan: MBS       GO                Houston Siren 04/15/2016, 4:43 PM   [name] Naevia Unterreiner M.Ed [employer] [phone]

## 2016-04-15 NOTE — Progress Notes (Signed)
Subjective: Interval History: has no complaint, a little better, wife assesses also, knows day.  Objective: Vital signs in last 24 hours: Temp:  [98 F (36.7 C)-98.8 F (37.1 C)] 98.6 F (37 C) (10/25 0450) Pulse Rate:  [98-118] 118 (10/25 0450) Resp:  [18] 18 (10/25 0450) BP: (98-174)/(60-103) 137/60 (10/25 0450) SpO2:  [100 %] 100 % (10/25 0450) Weight:  [77 kg (169 lb 12.1 oz)] 77 kg (169 lb 12.1 oz) (10/24 1213) Weight change: -1.2 kg (-2 lb 10.3 oz)  Intake/Output from previous day: 10/24 0701 - 10/25 0700 In: -  Out: 1878  Intake/Output this shift: No intake/output data recorded.  General appearance: moderately obese, pale and confused Resp: diminished breath sounds bilaterally and rales bibasilar Cardio: S1, S2 normal and systolic murmur: systolic ejection 2/6, decrescendo at 2nd left intercostal space GI: pos bs, bruits, incision lower abd, and PD cath site Lane mid abdm Extremities: edema 1+ and AVF LUA B&T  Lab Results:  Recent Labs  04/13/16 0554 04/14/16 0800  WBC 10.2 10.5  HGB 9.4* 10.1*  HCT 31.2* 34.3*  PLT 259 289   BMET:  Recent Labs  04/13/16 0554 04/14/16 0403  NA 138 138  K 4.4 3.9  CL 102 100*  CO2 18* 21*  GLUCOSE 135* 112*  BUN 40* 22*  CREATININE 8.69* 5.30*  CALCIUM 8.5* 8.3*   No results for input(s): PTH in the last 72 hours. Iron Studies: No results for input(s): IRON, TIBC, TRANSFERRIN, FERRITIN in the last 72 hours.  Studies/Results: Dg Chest 1 View  Result Date: 04/13/2016 CLINICAL DATA:  Sharp chest pain EXAM: CHEST 1 VIEW COMPARISON:  04/05/2016 FINDINGS: Cardiomegaly and congested vascular appearance. Trace bilateral pleural fluid or thickening. Extensive atherosclerosis of the aorta. Multiple stents of the left axilla. IMPRESSION: Cardiomegaly and pulmonary venous congestion. Electronically Signed   By: Monte Fantasia M.D.   On: 04/13/2016 10:05   Mr Brain Wo Contrast  Result Date: 04/13/2016 CLINICAL DATA:  Altered  mental status. EXAM: MRI HEAD WITHOUT CONTRAST TECHNIQUE: Multiplanar, multiecho pulse sequences of the brain and surrounding structures were obtained without intravenous contrast. COMPARISON:  None FINDINGS: Brain: Motion degraded exam. Only sagittal T1 weighted imaging, axial diffusion, axial T2, and axial FLAIR sequences could be obtained before patient refused to continue. No acute infarct, hemorrhage, hydrocephalus, or swelling. Small-vessel ischemic change in the deep cerebral white matter, mild for age. Remote lacunar infarct in the left caudate. Mild generalized cerebral volume loss. Vascular: Normal flow voids. Skull and upper cervical spine: Normal marrow signal. Sinuses/Orbits: Chronic sinusitis including complete opacification of the right maxillary antrum with inspissated secretions. Mild mucosal thickening in the mastoids. IMPRESSION: 1. No acute finding, including infarct. 2. Generalized atrophy and mild chronic microvascular ischemic change. 3. Partial, motion degraded exam. Electronically Signed   By: Monte Fantasia M.D.   On: 04/13/2016 10:41   Dg Chest Left Decubitus  Result Date: 04/13/2016 CLINICAL DATA:  Sharp chest pain. EXAM: CHEST - LEFT DECUBITUS COMPARISON:  Chest x-ray from earlier today FINDINGS: Trace bilateral pleural fluid or thickening. No significant layering fluid on left side down decubitus. No right-sided pneumothorax is noted. Cardiomegaly and vascular congestion. IMPRESSION: Left side down decubitus shows no significant layering pleural fluid or right-sided pneumothorax. Electronically Signed   By: Monte Fantasia M.D.   On: 04/13/2016 10:04   Ct Abdomen Pelvis W Contrast  Result Date: 04/14/2016 CLINICAL DATA:  Patient is poor historian. History of ascites. Sepsis on present a shin. Tachycardia, tachypnea  and hypotension. EXAM: CT ABDOMEN AND PELVIS WITH CONTRAST TECHNIQUE: Multidetector CT imaging of the abdomen and pelvis was performed using the standard protocol  following bolus administration of intravenous contrast. CONTRAST:  151mL ISOVUE-300 IOPAMIDOL (ISOVUE-300) INJECTION 61% COMPARISON:  04/02/2016, 04/01/2016, 11/26/2014, 08/06/2013 FINDINGS: Lower chest: Stable appearance of mild right middle lobe subpleural consolidation and fibrosis. Moderate emphysematous disease involving the inferior aspects of the upper lobes and the bilateral lower lobes. Persistent oblong areas of masslike consolidation along the left lower lobe posterior pleural surface and the pleural surface of the lingula laterally. These are similar in appearance compared with 04/02/2016, slightly decreased from 11/26/2014. Dense atherosclerotic vascular disease of the imaged portions of the aorta. Mildly ectatic ascending aorta, measuring up to 3.9 cm, unchanged. Marked coronary artery calcification. Heart size is slightly enlarged. No large pericardial effusion. Mitral valvular calcifications are also noted. Small amount of left greater than right gynecomastia. Hepatobiliary: Nodular contour of the liver, consistent with cirrhosis. No gross focal hepatic abnormalities. Surgical clips in the gallbladder fossa consistent with prior cholecystectomy. No biliary dilatation. Pancreas: Unremarkable. No pancreatic ductal dilatation or surrounding inflammatory changes. Marked vascular calcifications. Spleen: Borderline enlargement of the spleen measuring 12 cm, unchanged. Now seen is a tiny slightly wedge-shaped relatively peripheral hypodensity in the posterior spleen measuring 9 mm. Normal splenic vein and portal vein enhancement. Adrenals/Urinary Tract: Mild nodular thickening of the left adrenal gland is unchanged. Kidneys are atrophic. No hydronephrosis. Stable hypodense lesion in the mid cortex of right kidney. Stable dominant cyst in the midpole of left kidney. Additional hypodense lesions in the left kidney as before. Stomach/Bowel: Stomach contains fluid. There is no dilated small bowel to suggest an  obstruction. There is mild thickening of small bowel loops in the left hemi abdomen, series 2, image number 56. This does not appear significantly changed. No definite intramural air present within the thickened small bowel loop. There is diverticular disease of the sigmoid colon. Minimal wall thickening. Vascular/Lymphatic: Dense vascular calcifications within the aorta. No aneurysm. No pathologically enlarged mesenteric or pelvic nodes. Reproductive: Prostate is unremarkable. Other: Small amount of abdominal and pelvic ascites, decreased compared to the previous exam. The peritoneal pelvic dialysis catheter has been removed. No free air. Musculoskeletal: Degenerative changes of the spine. No acute osseous abnormality. IMPRESSION: 1. Moderate emphysematous disease at the bilateral lung bases with persistent ovoid areas of consolidation at the pleural surface of the lingula and left lower lobe posteriorly, findings were present dating back to 11/26/2014, query chronic consolidation/atelectasis with pleural based mass(es) also remaining in the differential. 2. Liver cirrhosis. Borderline enlarged spleen. Spleen now demonstrates a 9 mm somewhat wedge-shaped peripheral opacity, question small infarct. Patent splenic and portal veins. 3. Removal of pelvic peritoneal dialysis catheter. There is decreased abdominal and pelvic ascites. 4. Persistent thickened fluid-filled small bowel loops in the left hemi abdomen, possible inflammatory change. No definite intramural air or portal venous gas is present. 5. Atrophic kidney with bilateral hypodense lesions probable cyst as before. 6. Sigmoid diverticulosis with slight wall thickening of the sigmoid colon. Electronically Signed   By: Donavan Foil M.D.   On: 04/14/2016 18:20    I have reviewed the patient's current medications.  Assessment/Plan: 1 ESRD for HD tomorrow.  Vol xs mild. tol HD well 2 Fungal peritonitis awaiting ID of fungus 3 Anemia stable  On esa 4 HPTH  vit D 5 Encephalopathy  ?? Slowly better, related to infx, ? Some role liver dz 6 Nutrition P HD, Difluc, mobilize,  LOS: 6 days   Victor Lane 04/15/2016,9:22 AM

## 2016-04-16 ENCOUNTER — Inpatient Hospital Stay (HOSPITAL_COMMUNITY): Payer: Medicare Other

## 2016-04-16 DIAGNOSIS — Z515 Encounter for palliative care: Secondary | ICD-10-CM

## 2016-04-16 DIAGNOSIS — M549 Dorsalgia, unspecified: Secondary | ICD-10-CM

## 2016-04-16 LAB — COMPREHENSIVE METABOLIC PANEL
ALBUMIN: 1.9 g/dL — AB (ref 3.5–5.0)
ALK PHOS: 84 U/L (ref 38–126)
ALT: 33 U/L (ref 17–63)
AST: 47 U/L — ABNORMAL HIGH (ref 15–41)
Anion gap: 16 — ABNORMAL HIGH (ref 5–15)
BILIRUBIN TOTAL: 1 mg/dL (ref 0.3–1.2)
BUN: 36 mg/dL — AB (ref 6–20)
CALCIUM: 8.7 mg/dL — AB (ref 8.9–10.3)
CO2: 23 mmol/L (ref 22–32)
CREATININE: 6.86 mg/dL — AB (ref 0.61–1.24)
Chloride: 104 mmol/L (ref 101–111)
GFR calc Af Amer: 8 mL/min — ABNORMAL LOW (ref >60)
GFR calc non Af Amer: 7 mL/min — ABNORMAL LOW (ref >60)
GLUCOSE: 66 mg/dL (ref 65–99)
Potassium: 4 mmol/L (ref 3.5–5.1)
SODIUM: 143 mmol/L (ref 135–145)
TOTAL PROTEIN: 5.5 g/dL — AB (ref 6.5–8.1)

## 2016-04-16 LAB — CBC
HCT: 34.7 % — ABNORMAL LOW (ref 39.0–52.0)
Hemoglobin: 9.8 g/dL — ABNORMAL LOW (ref 13.0–17.0)
MCH: 31.4 pg (ref 26.0–34.0)
MCHC: 28.2 g/dL — AB (ref 30.0–36.0)
MCV: 111.2 fL — AB (ref 78.0–100.0)
PLATELETS: 234 10*3/uL (ref 150–400)
RBC: 3.12 MIL/uL — ABNORMAL LOW (ref 4.22–5.81)
RDW: 18.5 % — AB (ref 11.5–15.5)
WBC: 13.3 10*3/uL — ABNORMAL HIGH (ref 4.0–10.5)

## 2016-04-16 LAB — MAGNESIUM: Magnesium: 2.5 mg/dL — ABNORMAL HIGH (ref 1.7–2.4)

## 2016-04-16 LAB — PHOSPHORUS: Phosphorus: 5.2 mg/dL — ABNORMAL HIGH (ref 2.5–4.6)

## 2016-04-16 MED ORDER — METOPROLOL TARTRATE 5 MG/5ML IV SOLN: 5.0000 mg | Freq: Four times a day (QID) | INTRAVENOUS | Status: DC | PRN

## 2016-04-16 MED ORDER — FLUCONAZOLE 100 MG PO TABS
200.0000 mg | ORAL_TABLET | Freq: Every day | ORAL | Status: DC
  Administered 2016-04-16 – 2016-04-19 (×3): 200 mg via ORAL
  Filled 2016-04-16: qty 1
  Filled 2016-04-16 (×3): qty 2

## 2016-04-16 MED ORDER — LIDOCAINE 5 % EX PTCH
1.0000 | MEDICATED_PATCH | CUTANEOUS | Status: DC
Start: 2016-04-16 — End: 2016-04-19
  Administered 2016-04-16 – 2016-04-19 (×4): 1 via TRANSDERMAL
  Filled 2016-04-16 (×4): qty 1

## 2016-04-16 NOTE — Progress Notes (Signed)
Pharmacy Antibiotic Note  Victor Lane is a 70 y.o. male admitted on 04/15/2016 with peritonitis.  PD cath removed 10/20. Fluid cx neg but high cell count. ESRD now on HD TTS therefore dose will not change.  Plan: Change fluconazole to 200 PO q24h at night - ok per Dr. Allyson Sabal Pharmacy signing off, please re-consult if needed   Height: 5\' 11"  (180.3 cm) Weight: 167 lb 1.7 oz (75.8 kg) IBW/kg (Calculated) : 75.3  Temp (24hrs), Avg:97.8 F (36.6 C), Min:97 F (36.1 C), Max:98.8 F (37.1 C)   Recent Labs Lab 04/01/2016 1639 04/17/2016 2006 03/29/2016 2244 04/07/2016 0056 03/31/2016 0512 04/11/16 0343 04/13/16 0554 04/14/16 0403 04/14/16 0800 04/16/16 1253  WBC  --   --   --   --  13.7* 12.5* 10.2  --  10.5 13.3*  CREATININE  --   --   --   --  9.11* 10.24* 8.69* 5.30*  --  6.86*  LATICACIDVEN 3.66* 1.41 1.4 1.1  --   --   --   --   --   --     Estimated Creatinine Clearance: 10.7 mL/min (by C-G formula based on SCr of 6.86 mg/dL (H)).   ESRD  Allergies  Allergen Reactions  . Fentanyl Other (See Comments)    Confusion when patch applied  . Nsaids Other (See Comments)    Severe kidney disease/ESRD  . Amoxicillin Diarrhea and Nausea And Vomiting  . Moxifloxacin Diarrhea and Nausea And Vomiting    Antimicrobials this admission:  Zosyn 10/19 >.10/22 Vanc 10/19 >> 10/22 Fluconazole 10/19 >>   Dose adjustments this admission:  N/A  Microbiology results:  10/19 BCx: ngF 10/19 UCx: ngtd 10/19 GI PCR: neg  10/19 C Diff: neg  10/18 outpt peritoneal cx: + for yeast, mold, PD cath removed 10/20 10/19  Peritoneal dialysate:  sent 10/20 MRSA PCR: neg 10/24 CSF NGTD  Thank you for allowing pharmacy to be a part of this patient's care.  Renold Genta, PharmD, BCPS Clinical Pharmacist Phone for today - [place] - [phone] 04/16/2016 2:43 PM

## 2016-04-16 NOTE — Progress Notes (Signed)
[facility] PROGRESS NOTE  Victor Lane [id number] DOB: 10/01/1945 DOA: 04/03/2016   PCP: Beatris Si     Assessment/Plan: Principal Problem:   Peritonitis ([place]) Active Problems:   HTN (hypertension)   Anemia of chronic disease   Continuous ambulatory peritoneal dialysis status ([place])   ESRD on dialysis ([place])   Dilated aortic root (HCC)   CAD (coronary artery disease), native coronary artery   Dyslipidemia   Chronic systolic congestive heart failure (HCC)   Carotid stenosis   Abdominal pain   Diabetes mellitus with complication (HCC)   Hypokalemia   Acute encephalopathy   Acute abdomen   Back pain   Palliative care encounter     70 y.o. 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  He presents to ED sent by renal MD for PD cath removal for refractory culture negative peritonitis. Per chart last ECHO shows EF 30%. Hypotensive on arrival. Possible small left lower lobe infiltrate.    Assessment and plan Peritonitis (HCC)/left lower lobe pneumonia / sepsis:  his ascites fluid cultures from 10/11 was negative, but has high cell count in fluid, indicating persistent or recurrent peritonitis. Pt was suspected to have sepsis on presentation with elevated lactate, tachycardia, tachypnea and hypotension. Blood pressure responded to IV fluid resuscitation,   Nephrology following Status pos removal of PD cath by general surgery  10/20   Hemodynamics have improved with confusion has not Currently on vancomycin, Zosyn with hemodialysis since 10/19, along  with  Diflucan [outpatient culture suspicious for   Yeast , identification pending] Duration of treatment-may need infectious disease consult Blood culture from 10/19 negative, culture from peritoneal washings 10/11, 10/8  Negative     Nonsustained VT Patient has a very low EF, 30-35%, repeat 2-D echo pending Patient is already on Diflucan, should not add any other medication to prolong QTC Haldol discontinued  Abdominal pain Severe abdominal pain with HD 10/25 ,   CT 10/24 Spleen shows  9 mm somewhat wedge-shaped peripheral opacity,question small infarct. Patent splenic and portal veins, nl LFT's , lipase  -Etiology unclear, but suspect this is multifactorial including recent infectious gastroenteritis, constipation, peritonitis, possible mesenteric ischemia Evaluated by  vascular surgery 10/11 --low suspicion of mesenteric ischemia causing abdominal pain -04/01/2016 CT angiogram abdomen--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, celiac origin, IMA -03/28/2016 and 04/01/2016 CTs of the abdomen negative for abscess -04/02/16 CT angio abd--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, celiac origin, IMA unchanged--?SB thickening related to PD fluid/cirrhosis   seen by GI ([facility]) 10/13- did not feel there was any intraabd castastrophe--continue medical therapy  seen by general surgery consult--no indication for exploratory laparoscopy   -04/04/16--pt and wife wanted to leave AMA--discussed with Dr. Jobie Quaker ceftazidime 2g and follow up 04/06/16 with home teaching at dialysis center  given findings of a small infarct in the spleen , repeat 2-D echo ordered and pending    Acute metabolic encephalopathy:  continues to have intermittent confusion Need to rule out ongoing infection, repeat chest x-ray to evaluate left pleural effusion Continue to treat underlying infection MRI of the brain to rule out CVA, patient needs a lumbar puncture , as per nephrology Ammonia level within normal limits LP negative so far , await CSF fluid cx  ESRD-on PD: None hemodialysis per Dr. Jonnie Finner,   Transitioned PD to HD,AVF functional L arm. HD as per nephrology recommendations  clipped to [facility]   Hypokalemia:  , resolved Repleted   CAD: s/p of  stent. No CP -continue aspirin per rectal, IV metoprolol  HTN: -hold oral meds -IV hydralazine when necessary and IV metoprolol with holding parameters.  Chronic systolic congestive heart failure: 2-D echo on 10/08/14 showed EF 30-35 percent. Patient does not have leg edema or JVD. CHF seems to be compensated on admission. Pt is not on diuretics at home. Volume status being managed with hemodialysis Repeat echo to r/o embolic source    DM-II: Last A1c 5.5, well controled. Patient is taking glipizide at home CBGs have remained stable, will DC Accu-Cheks   Back pain-patient's wife states that patient had a recent fall Plain films show spondylosis of the lumbar spine, no fracture   Dysphagia-patient deemed to be high risk for aspiration, will keep the patient NPO except meds, patient has had difficulty swallowing medications,MBS swallow pending, patient is confused to participate. Previous history of radiation therapy to vocal cords     DVT prophylaxsis heparin  Code Status:  Full code    Family Communication: Discussed in detail with the patient's wife , all imaging results, lab results explained to the patient   Disposition   remains inpatient, palliative care consult for goals of care given   multiple comorbidities and no signs of improvement     Consultants:  General surgery  Nephrology  Procedures:  None  Antibiotics: Anti-infectives    Start     Dose/Rate Route Frequency Ordered Stop   04/14/16 1200  vancomycin (VANCOCIN) IVPB 1000 mg/200 mL premix  Status:  Discontinued     1,000 mg 200 mL/hr over 60 Minutes Intravenous Every T-Th-Sa (Hemodialysis) 04/11/16 1447 04/12/16 1259   04/11/16 1800  vancomycin (VANCOCIN) IVPB 1000 mg/200 mL premix     1,000 mg 200 mL/hr over 60 Minutes Intravenous  Once 04/11/16 1447 04/11/16 2022   04/11/16 1330  piperacillin-tazobactam (ZOSYN) IVPB 3.375 g  Status:  Discontinued     3.375 g 12.5 mL/hr over 240 Minutes  Intravenous Every 12 hours 04/11/16 1315 04/12/16 1259   04/11/16 1000  piperacillin-tazobactam (ZOSYN) IVPB 3.375 g  Status:  Discontinued     3.375 g 12.5 mL/hr over 240 Minutes Intravenous Every 12 hours 04/11/16 0353 04/11/16 1315   04/18/2016 2300  fluconazole (DIFLUCAN) IVPB 200 mg     200 mg 100 mL/hr over 60 Minutes Intravenous Every 24 hours 04/11/2016 2232     04/08/2016 2200  piperacillin-tazobactam (ZOSYN) IVPB 2.25 g  Status:  Discontinued     2.25 g 100 mL/hr over 30 Minutes Intravenous Every 6 hours 04/11/2016 1622 04/11/16 0353   04/01/2016 1615  piperacillin-tazobactam (ZOSYN) IVPB 3.375 g     3.375 g 100 mL/hr over 30 Minutes Intravenous  Once 04/16/2016 1609 03/22/2016 1756   04/20/2016 1615  vancomycin (VANCOCIN) 1,500 mg in sodium chloride 0.9 % 500 mL IVPB     1,500 mg 250 mL/hr over 120 Minutes Intravenous  Once 03/23/2016 1609 04/02/2016 2140          HPI/Subjective: Very restless and agitated last night, had nonsustained VT   Objective: Vitals:   04/15/16 2330 04/15/16 2339 04/16/16 0242 04/16/16 0452  BP:    133/66  Pulse:    (!) 115  Resp:    (!) 22  Temp:    97.5 F (36.4 C)  TempSrc:    Oral  SpO2: (!) 88% 100% 100% 100%  Weight:      Height:        Intake/Output Summary (Last 24 hours) at 04/16/16 1141 Last data filed at 04/16/16 0846  Gross per 24 hour  Intake              100 ml  Output                0 ml  Net              100 ml    Exam:  Examination:  General exam: Confused  Respiratory system: Clear to auscultation. Respiratory effort normal. Cardiovascular system: S1 & S2 heard, RRR. No JVD, murmurs, rubs, gallops or clicks. No pedal edema. Gastrointestinal system: Abdomen is nondistended, soft and nontender. No organomegaly or masses felt. Normal bowel sounds heard. Central nervous system:  . No focal neurological deficits. Extremities: Symmetric 5 x 5 power. Skin: No rashes, lesions or ulcers Psychiatry: Judgement and insight appear  normal. Mood & affect appropriate.     Data Reviewed: I have personally reviewed following labs and imaging studies  Micro Results Recent Results (from the past 240 hour(s))  C difficile quick scan w PCR reflex     Status: None   Collection Time: 03/22/2016  4:07 PM  Result Value Ref Range Status   C Diff antigen NEGATIVE NEGATIVE Final   C Diff toxin NEGATIVE NEGATIVE Final   C Diff interpretation No C. difficile detected.  Final  Gastrointestinal Panel by PCR , Stool     Status: None   Collection Time: 03/31/2016  4:07 PM  Result Value Ref Range Status   Campylobacter species NOT DETECTED NOT DETECTED Final   Plesimonas shigelloides NOT DETECTED NOT DETECTED Final   Salmonella species NOT DETECTED NOT DETECTED Final   Yersinia enterocolitica NOT DETECTED NOT DETECTED Final   Vibrio species NOT DETECTED NOT DETECTED Final   Vibrio cholerae NOT DETECTED NOT DETECTED Final   Enteroaggregative E coli (EAEC) NOT DETECTED NOT DETECTED Final   Enteropathogenic E coli (EPEC) NOT DETECTED NOT DETECTED Final   Enterotoxigenic E coli (ETEC) NOT DETECTED NOT DETECTED Final   Shiga like toxin producing E coli (STEC) NOT DETECTED NOT DETECTED Final   Shigella/Enteroinvasive E coli (EIEC) NOT DETECTED NOT DETECTED Final   Cryptosporidium NOT DETECTED NOT DETECTED Final   Cyclospora cayetanensis NOT DETECTED NOT DETECTED Final   Entamoeba histolytica NOT DETECTED NOT DETECTED Final   Giardia lamblia NOT DETECTED NOT DETECTED Final   Adenovirus F40/41 NOT DETECTED NOT DETECTED Final   Astrovirus NOT DETECTED NOT DETECTED Final   Norovirus GI/GII NOT DETECTED NOT DETECTED Final   Rotavirus A NOT DETECTED NOT DETECTED Final   Sapovirus (I, II, IV, and V) NOT DETECTED NOT DETECTED Final  Blood Culture (routine x 2)     Status: None   Collection Time: 04/01/2016  4:17 PM  Result Value Ref Range Status   Specimen Description BLOOD RIGHT HAND  Final   Special Requests BOTTLES DRAWN AEROBIC AND  ANAEROBIC 5CC  Final   Culture NO GROWTH 5 DAYS  Final   Report Status 04/14/2016 FINAL  Final  Blood Culture (routine x 2)     Status: None   Collection Time: 04/12/2016  4:23 PM  Result Value Ref Range Status   Specimen Description BLOOD LEFT ANTECUBITAL  Final   Special Requests BOTTLES DRAWN AEROBIC AND ANAEROBIC 5CC  Final  Culture NO GROWTH 5 DAYS  Final   Report Status 04/14/2016 FINAL  Final  MRSA PCR Screening     Status: None   Collection Time: 04/09/2016 10:29 AM  Result Value Ref Range Status   MRSA by PCR NEGATIVE NEGATIVE Final    Comment:        The GeneXpert MRSA Assay (FDA approved for NASAL specimens only), is one component of a comprehensive MRSA colonization surveillance program. It is not intended to diagnose MRSA infection nor to guide or monitor treatment for MRSA infections.   CSF culture     Status: None (Preliminary result)   Collection Time: 04/14/16 12:54 PM  Result Value Ref Range Status   Specimen Description CSF  Final   Special Requests TUBE 2  Final   Gram Stain   Final    WBC PRESENT, PREDOMINANTLY MONONUCLEAR NO ORGANISMS SEEN CYTOSPIN    Culture NO GROWTH 2 DAYS  Final   Report Status PENDING  Incomplete    Radiology Reports Dg Chest 1 View  Result Date: 04/13/2016 CLINICAL DATA:  Sharp chest pain EXAM: CHEST 1 VIEW COMPARISON:  04/07/2016 FINDINGS: Cardiomegaly and congested vascular appearance. Trace bilateral pleural fluid or thickening. Extensive atherosclerosis of the aorta. Multiple stents of the left axilla. IMPRESSION: Cardiomegaly and pulmonary venous congestion. Electronically Signed   By: Monte Fantasia M.D.   On: 04/13/2016 10:05   Dg Chest 2 View  Result Date: 04/01/2016 CLINICAL DATA:  Suspected sepsis.  Peritoneal dialysis patient. EXAM: CHEST  2 VIEW COMPARISON:  Chest radiograph 07/19/2015.  Abdominal CT 03/28/2016 FINDINGS: Stable cardiomegaly and mediastinal contours. There is thoracic aortic atherosclerosis. Small  bilateral pleural effusions and bibasilar airspace disease. No pulmonary edema. No pneumothorax. Vascular stent in the left axilla. Free air under the right hemidiaphragm, likely related to peritoneal dialysis. Resorption of the distal right clavicle. IMPRESSION: Chronic cardiomegaly, stable.  Thoracic aortic atherosclerosis. Small bilateral pleural effusions with bibasilar airspace disease, likely atelectasis. Electronically Signed   By: Jeb Levering M.D.   On: 04/01/2016 05:40   Dg Thoracic Spine 2 View  Result Date: 04/12/2016 CLINICAL DATA:  70 year old male with history of back pain and weight loss. EXAM: THORACIC SPINE 2 VIEWS COMPARISON:  No priors. FINDINGS: There is no evidence of thoracic spine fracture. Alignment is normal. Mild multilevel degenerative disc disease, typical for the patient's age. No other significant bone abnormalities are identified. Aortic atherosclerosis. Vascular stent projecting over the left axillary region, incompletely visualized. IMPRESSION: 1. No acute radiographic abnormality of the thoracic spine. 2. Aortic atherosclerosis. Electronically Signed   By: Vinnie Langton M.D.   On: 04/12/2016 15:19   Dg Lumbar Spine 2-3 Views  Result Date: 04/12/2016 CLINICAL DATA:  Back pain lumbar region. EXAM: LUMBAR SPINE - 2-3 VIEW COMPARISON:  04/04/2013 FINDINGS: Vertebral body alignment, heights and disc space heights are normal. There is mild spondylosis throughout the lumbar spine to include facet arthropathy over the lower lumbar spine. No compression fracture or subluxation. Moderate calcified plaque over the abdominal aorta and iliac arteries IMPRESSION: Mild spondylosis of the lumbar spine to include facet arthropathy. Electronically Signed   By: Marin Olp M.D.   On: 04/12/2016 15:25   Dg Pelvis 1-2 Views  Result Date: 04/12/2016 CLINICAL DATA:  Low back pain. Unable to obtain history from patient. EXAM: PELVIS - 1-2 VIEW COMPARISON:  None. FINDINGS: Mild  symmetric degenerative change of the hips. Mild diffuse decreased bone mineralization is present. There is no acute fracture or dislocation.  Skin staples are present over the soft tissues of the left hip region. Several pelvic phleboliths. Calcified plaque over the iliac and femoral arteries. IMPRESSION: No acute findings. Electronically Signed   By: Marin Olp M.D.   On: 04/12/2016 15:26   Mr Brain Wo Contrast  Result Date: 04/13/2016 CLINICAL DATA:  Altered mental status. EXAM: MRI HEAD WITHOUT CONTRAST TECHNIQUE: Multiplanar, multiecho pulse sequences of the brain and surrounding structures were obtained without intravenous contrast. COMPARISON:  None FINDINGS: Brain: Motion degraded exam. Only sagittal T1 weighted imaging, axial diffusion, axial T2, and axial FLAIR sequences could be obtained before patient refused to continue. No acute infarct, hemorrhage, hydrocephalus, or swelling. Small-vessel ischemic change in the deep cerebral white matter, mild for age. Remote lacunar infarct in the left caudate. Mild generalized cerebral volume loss. Vascular: Normal flow voids. Skull and upper cervical spine: Normal marrow signal. Sinuses/Orbits: Chronic sinusitis including complete opacification of the right maxillary antrum with inspissated secretions. Mild mucosal thickening in the mastoids. IMPRESSION: 1. No acute finding, including infarct. 2. Generalized atrophy and mild chronic microvascular ischemic change. 3. Partial, motion degraded exam. Electronically Signed   By: Monte Fantasia M.D.   On: 04/13/2016 10:41   Dg Chest Left Decubitus  Result Date: 04/13/2016 CLINICAL DATA:  Sharp chest pain. EXAM: CHEST - LEFT DECUBITUS COMPARISON:  Chest x-ray from earlier today FINDINGS: Trace bilateral pleural fluid or thickening. No significant layering fluid on left side down decubitus. No right-sided pneumothorax is noted. Cardiomegaly and vascular congestion. IMPRESSION: Left side down decubitus shows no  significant layering pleural fluid or right-sided pneumothorax. Electronically Signed   By: Monte Fantasia M.D.   On: 04/13/2016 10:04   Ct Abdomen Pelvis W Contrast  Result Date: 04/14/2016 CLINICAL DATA:  Patient is poor historian. History of ascites. Sepsis on present a shin. Tachycardia, tachypnea and hypotension. EXAM: CT ABDOMEN AND PELVIS WITH CONTRAST TECHNIQUE: Multidetector CT imaging of the abdomen and pelvis was performed using the standard protocol following bolus administration of intravenous contrast. CONTRAST:  15mL ISOVUE-300 IOPAMIDOL (ISOVUE-300) INJECTION 61% COMPARISON:  04/02/2016, 04/01/2016, 11/26/2014, 08/06/2013 FINDINGS: Lower chest: Stable appearance of mild right middle lobe subpleural consolidation and fibrosis. Moderate emphysematous disease involving the inferior aspects of the upper lobes and the bilateral lower lobes. Persistent oblong areas of masslike consolidation along the left lower lobe posterior pleural surface and the pleural surface of the lingula laterally. These are similar in appearance compared with 04/02/2016, slightly decreased from 11/26/2014. Dense atherosclerotic vascular disease of the imaged portions of the aorta. Mildly ectatic ascending aorta, measuring up to 3.9 cm, unchanged. Marked coronary artery calcification. Heart size is slightly enlarged. No large pericardial effusion. Mitral valvular calcifications are also noted. Small amount of left greater than right gynecomastia. Hepatobiliary: Nodular contour of the liver, consistent with cirrhosis. No gross focal hepatic abnormalities. Surgical clips in the gallbladder fossa consistent with prior cholecystectomy. No biliary dilatation. Pancreas: Unremarkable. No pancreatic ductal dilatation or surrounding inflammatory changes. Marked vascular calcifications. Spleen: Borderline enlargement of the spleen measuring 12 cm, unchanged. Now seen is a tiny slightly wedge-shaped relatively peripheral hypodensity  in the posterior spleen measuring 9 mm. Normal splenic vein and portal vein enhancement. Adrenals/Urinary Tract: Mild nodular thickening of the left adrenal gland is unchanged. Kidneys are atrophic. No hydronephrosis. Stable hypodense lesion in the mid cortex of right kidney. Stable dominant cyst in the midpole of left kidney. Additional hypodense lesions in the left kidney as before. Stomach/Bowel: Stomach contains fluid. There is no dilated  small bowel to suggest an obstruction. There is mild thickening of small bowel loops in the left hemi abdomen, series 2, image number 56. This does not appear significantly changed. No definite intramural air present within the thickened small bowel loop. There is diverticular disease of the sigmoid colon. Minimal wall thickening. Vascular/Lymphatic: Dense vascular calcifications within the aorta. No aneurysm. No pathologically enlarged mesenteric or pelvic nodes. Reproductive: Prostate is unremarkable. Other: Small amount of abdominal and pelvic ascites, decreased compared to the previous exam. The peritoneal pelvic dialysis catheter has been removed. No free air. Musculoskeletal: Degenerative changes of the spine. No acute osseous abnormality. IMPRESSION: 1. Moderate emphysematous disease at the bilateral lung bases with persistent ovoid areas of consolidation at the pleural surface of the lingula and left lower lobe posteriorly, findings were present dating back to 11/26/2014, query chronic consolidation/atelectasis with pleural based mass(es) also remaining in the differential. 2. Liver cirrhosis. Borderline enlarged spleen. Spleen now demonstrates a 9 mm somewhat wedge-shaped peripheral opacity, question small infarct. Patent splenic and portal veins. 3. Removal of pelvic peritoneal dialysis catheter. There is decreased abdominal and pelvic ascites. 4. Persistent thickened fluid-filled small bowel loops in the left hemi abdomen, possible inflammatory change. No definite  intramural air or portal venous gas is present. 5. Atrophic kidney with bilateral hypodense lesions probable cyst as before. 6. Sigmoid diverticulosis with slight wall thickening of the sigmoid colon. Electronically Signed   By: Donavan Foil M.D.   On: 04/14/2016 18:20   Ct Abdomen Pelvis W Contrast  Result Date: 03/29/2016 CLINICAL DATA:  Acute onset of generalized abdominal pain. Recent peritoneal dialysis. Vomiting and diarrhea. Initial encounter. EXAM: CT ABDOMEN AND PELVIS WITH CONTRAST TECHNIQUE: Multidetector CT imaging of the abdomen and pelvis was performed using the standard protocol following bolus administration of intravenous contrast. CONTRAST:  116mL ISOVUE-300 IOPAMIDOL (ISOVUE-300) INJECTION 61% COMPARISON:  CT of the abdomen and pelvis from 11/26/2014 FINDINGS: Lower chest: Trace right-sided pleural fluid is noted. Bibasilar atelectasis or scarring is seen. The visualized portions of the mediastinum are unremarkable. Hepatobiliary: A small amount of ascites is seen tracking about the liver. The liver is unremarkable in appearance. The patient is status post cholecystectomy, with clips noted at the gallbladder fossa. The common bile duct remains normal in caliber. Pancreas: The pancreas is within normal limits. Spleen: The spleen is unremarkable in appearance. Adrenals/Urinary Tract: The adrenal glands are grossly unremarkable in appearance. Severe chronic bilateral renal atrophy is noted, with scattered bilateral renal cysts. There is no evidence of hydronephrosis. No renal or ureteral stones are identified. No significant perinephric stranding is seen. Stomach/Bowel: The stomach is unremarkable in appearance. The small bowel is within normal limits. The appendix is normal in caliber, without evidence of appendicitis. Scattered diverticulosis is noted along the distal descending and sigmoid colon, without definite evidence of diverticulitis. Vascular/Lymphatic: Diffuse calcification is seen  along the abdominal aorta and its branches. There is relatively severe luminal narrowing at the proximal right common iliac artery, and moderate luminal narrowing along the left common iliac artery. Relatively severe luminal narrowing is also noted along the proximal left superficial femoral artery. No retroperitoneal or pelvic sidewall lymphadenopathy is seen. Reproductive: The bladder is mildly distended and grossly unremarkable. The prostate remains normal in size. Other: The patient's peritoneal dialysis catheter is noted within the pelvis, with a small to moderate amount of free fluid in the pelvis and along both paracolic gutters. Musculoskeletal: No acute osseous abnormalities are identified. The visualized musculature is unremarkable in appearance.  IMPRESSION: 1. No acute abnormality seen to explain the patient's symptoms. 2. Status post recent peritoneal dialysis. Small to moderate volume ascites noted within the abdomen and pelvis. 3. Diffuse aortic atherosclerosis noted. Relatively severe luminal narrowing at the proximal right common iliac artery, and moderate luminal narrowing along the left common iliac artery. Relatively severe luminal narrowing also noted along the proximal left superficial femoral artery. 4. Severe chronic bilateral renal atrophy, with scattered bilateral renal cysts. 5. Scattered diverticulosis along the distal descending and sigmoid colon, without definite evidence of diverticulitis. 6. Trace right-sided pleural fluid. Bibasilar atelectasis or scarring noted. Electronically Signed   By: Garald Balding M.D.   On: 03/29/2016 00:10   Dg Chest Port 1 View  Result Date: 04/03/2016 CLINICAL DATA:  Sepsis.  History of head and neck cancer. EXAM: PORTABLE CHEST 1 VIEW COMPARISON:  Chest radiograph April 01, 2016 FINDINGS: Cardiac silhouette is mildly enlarged unchanged. Heavily calcified aortic. Diffuse interstitial prominence. Worsening retrocardiac consolidation and small LEFT  pleural effusion. RIGHT lung base strandy densities. No pneumothorax. Surgical clips LEFT neck, stent projects in RIGHT neck and LEFT subclavian vessels. Postsurgical changes RIGHT distal clavicle. Bibasilar strandy densities IMPRESSION: Stable cardiomegaly. Worsening retrocardiac consolidation with small LEFT pleural effusion. RIGHT lung base atelectasis/ scarring. Worsening bronchitic changes. Electronically Signed   By: Elon Alas M.D.   On: 03/27/2016 19:40   Ct Angio Abdomen Pelvis  W &/or Wo Contrast  Result Date: 04/03/2016 CLINICAL DATA:  70 year old male with abdominal pain. Negative lactic acidosis. Renal failure with peritoneal dialysis catheter. EXAM: CT ANGIOGRAPHY  ABDOMEN AND PELVIS TECHNIQUE: Multidetector CT imaging through the abdomen and pelvis was performed using the standard protocol during bolus administration of intravenous contrast. Delayed images included. Multiplanar reconstructed images including MIPs were obtained and reviewed to evaluate the vascular anatomy. CONTRAST:  100 cc Isovue 370 COMPARISON:  CT angiogram done the previous day, CT 03/28/2016, CT 11/26/2014 FINDINGS: VASCULAR Aorta: Extensive mixed calcified and soft plaque of the lower thoracic and abdominal aorta. No aneurysm. No dissection. No periaortic fluid. Celiac: Similar appearance of atherosclerotic changes at the origin of the celiac artery, which is patent though perhaps with 50% or greater stenosis. Post stenotic dilation. Typical branch pattern again noted, discussed on the prior CT. SMA: Similar stenotic appearance of the superior mesenteric artery with mixed calcified and soft plaque. Greater than 50% stenosis. Distal branches appear patent. Renals: Calcified and soft plaque at the origin the bilateral renal arteries. Single bilateral renal arteries. IMA: Patent IMA. Likely stenosis at the origin. Difficult to quantify degree of stenosis. Right lower extremity: Mixed calcified and soft plaque of the  right iliac system. Again, likely greater than 50% stenosis in the proximal right common iliac artery. Hypogastric artery and external iliac artery patent. Atherosclerotic disease throughout the external iliac artery. Common femoral artery in the proximal full more all vessels patent. Left lower extremity: Mixed calcified and soft plaque of the left iliac system. Likely stenosis at the distal common iliac artery. Hypogastric artery and external iliac artery patent. Disease through the length of the external iliac artery. Common femoral artery in the proximal fill more 0 vasculature patent on today's exam. Veins: Portal venous system remains patent. Unremarkable appearance of the systemic venous vasculature. Review of the MIP images confirms the above findings. NON-VASCULAR Lower chest: Respiratory motion somewhat limits evaluation of the lung bases. Coverage of the lower chest/ field-of-view larger on the current study. Plaque like thickening of the left sided pleura at the lung  base. This has been present since the CT 11/26/2014 Coronary calcifications.  Aortic valve calcifications. Hepatobiliary: Morphology of the liver compatible with cirrhosis with enlarged left lobe, caudate lobe, and nodular configuration. No focal lesion identified. Pancreas: Unremarkable appearance of pancreas Spleen: Borderline enlarged spleen Adrenals/Urinary Tract: Unremarkable appearance of adrenal glands. Right: Severe renal cortical thinning. No hydronephrosis or nephrolithiasis. Hypodense lesion on the lateral cortex and at the superior cortex too small to characterize. Left: Severe renal cortical thinning. No hydronephrosis. Similar appearance of low-density cystic structures, the larger of which are compatible with simple cysts. Smaller are too small to characterize. Excreted contrast within the urinary bladder. Stomach/Bowel: Unremarkable stomach. Small hiatal hernia. No evidence of small bowel obstruction or transition point.  Minimal thickening of small bowel wall, persists after the prior CT. Small bowel wall enhances on the arterial and venous phase, with no CT evidence of ischemia. Retained oral contrast within colon, limiting evaluation of the mucosal surface. No evidence of obstruction. Small stool burden. Diverticular disease. Normal appendix. Lymphatic: Low-density ascites within the pericolic gutter, right upper quadrant, and within the dependent pelvis associated with the peritoneal dialysis catheter. No enhancement of the surface is the peritoneum and. Vascular/lymphatic congestion within small bowel mesenteric, similar to prior. Small lymph nodes, none of which are enlarged. Mesenteric: Fluid throughout the peritoneal of small volume. Small amount of anti dependent gas, similar to prior. Peritoneal dialysis catheter terminates within the low pelvis, adjacent to the rectum. Reproductive: Unremarkable appearance of the pelvic organs. Other: Small fat containing umbilical hernia. Musculoskeletal: Sclerotic appearance of the skeletal elements. No acute bony abnormality. IMPRESSION: Vascular: No acute vascular abnormality. Similar to yesterday's study there is diffuse atherosclerotic changes of the abdominal aorta, with involvement of the mesenteric vasculature. Again, there is likely greater than 50% stenosis at the celiac artery and IMA artery, however, CTA resolution difficult to make precise assessment. The degree of stenosis at the SMA origin is likely high-grade given the post stenotic dilation and degree of disease. Distal branches of the mesenteric vessels remain patent. Bilateral renal artery stenosis. Portal venous system unremarkable. Systemic venous system unremarkable. Nonvascular: Morphology of the liver compatible with cirrhosis. Associated borderline splenomegaly, indicating developing portal hypertension. No evidence of bowel ischemia, with uniform enhancement of stomach and small bowel. Although the colon wall  cannot be assessed with the presence of positive enteric contrast, there are no focal findings to suggest colonic ischemia. Temporal resolution of mild small bowel thickening is improved with 24 hour follow-up CT, with mild thickening of the small bowel in the mid abdomen. There is associated lymphatic congestion of the small bowel mesenteric with associated small lymph nodes. Finding is nonspecific. There is no evidence of venous obstruction per se, although these findings can be seen with portal colopathy/enteropathy in the setting of cirrhosis and developing portal hypertension. Alternative consideration would be an nonspecific inflammatory or infectious enteritis. These results were called by telephone at the time of interpretation on 04/03/2016 at 8:56 am to Dr. Carles Collet, who verbally acknowledged these results. Small volume of low-density ascites, likely related to PD catheter. PD catheter within the dependent pelvis adjacent to rectum. No evidence of peritonitis on the contrast-enhanced exam. Persistent thickening of the left posterior lower pleura, unchanged from the CT 11/26/2014. This may reflect rounded atelectasis/scarring. Further evaluation with chest CT may be considered if the patient has a history of carcinoma. Coronary artery disease.  Aortic valve calcifications. Signed, Dulcy Fanny. Earleen Newport, DO [facility] Radiology Electronically  Signed   By: Corrie Mckusick D.O.   On: 04/03/2016 08:59   Ct Angio Abd/pel W And/or Wo Contrast  Result Date: 04/01/2016 CLINICAL DATA:  70 year old male with a history of lower abdominal pain and nausea vomiting. Constipation. EXAM: CTA ABDOMEN AND PELVIS WITH CONTRAST TECHNIQUE: Multidetector CT imaging of the abdomen and pelvis was performed using the standard protocol during bolus administration of intravenous contrast. Multiplanar reconstructed images and MIPs were obtained and reviewed to evaluate the vascular anatomy.  CONTRAST:  75 cc Isovue 370 COMPARISON:  CT 03/28/2016, 11/26/2014, 08/06/2013 FINDINGS: VASCULAR Aorta: Diffuse atherosclerosis of the abdominal aorta with soft plaque and calcified plaque. No dissection. No aneurysm. No periaortic fluid. Celiac: Atherosclerotic changes at the origin of the celiac artery which is patent. Post stenotic dilation. Typical branch pattern of the celiac artery, with left gastric, common hepatic, and splenic artery identified. SMA: Atherosclerotic changes of the superior mesenteric artery, with mixed calcified and soft plaque at the origin. The SMA appears patent, with greater than 50% stenosis. Renals: Dense calcified and soft plaque at the origin the bilateral renal arteries. Renal arteries are patent. Single left and single right renal artery. IMA: Inferior mesenteric artery is patent, though appears stenotic at the origin. Left colic artery patent. Superior rectal artery patent. Right lower extremity: Dense calcified and soft plaque involving the right iliac system, including common iliac artery and external iliac artery. Small caliber vessels, with likely multifocal stenoses. Greatest degree of stenosis may be at the distal common iliac artery above the hypogastric origin. Hypogastric artery is patent. Common femoral artery patent. Proximal SFA and profunda femoris patent. Left lower extremity: Mixed calcified and soft plaque of the left iliac system. Greatest degree of stenosis at the origin of the external iliac artery. Hypogastric artery patent. Common femoral artery patent. Proximal SFA and profunda femoris patent. Veins: Unremarkable appearance of the venous system. Review of the MIP images confirms the above findings. NON-VASCULAR Lower chest: Atelectatic changes at the lung bases. Small hiatal hernia. Hepatobiliary: Morphologic changes of the liver compatible with cirrhosis with developing left hepatic enlargement. Nodular contour. No focal lesion identified on the current CT.  No intrahepatic or extrahepatic biliary ductal dilatation. Cholecystectomy Pancreas: Unremarkable appearance of the pancreas. No pericholecystic fluid or inflammatory changes. Unremarkable ductal system. Spleen: Borderline splenomegaly. Adrenals/Urinary Tract: Unremarkable appearance of adrenal glands. Right: No hydronephrosis. Advanced cortical renal thinning. Symmetric perfusion to the left. Hypodense lesions within the cortex of the right kidney, the majority are too small to characterize though arm most likely benign cysts. Left: No hydronephrosis. Advanced cortical renal thinning come symmetric to the right. Symmetric perfusion. Cystic lesions in the cortex of the left kidney, most likely all benign cysts although the majority are too small to characterize. Unremarkable appearance of the urinary bladder with high density excreted contrast. Stomach/Bowel: Unremarkable appearance of the stomach. Small hiatal hernia. Unremarkable appearance of small bowel. Venous phase demonstrates enhancement of the small bowel and colon, although the colon somewhat limited by the presence of enteric contrast. Colonic diverticula. Lymphatic: Multiple lymph nodes in the para-aortic nodal station, none of which are enlarged. Mesenteric: Low-density free fluid within the abdomen, associated with peritoneal dialysis catheter. Small volume of free air in the anti dependent aspect. Reproductive: Unremarkable appearance of the prostate and pelvic organs. Other: Small fat containing umbilical hernia. Unremarkable appearance of the tract of the dialysis catheter. Musculoskeletal: No evidence of acute fracture. Developing sclerotic appearance of the skeletal structures, potentially early renal osteodystrophy. No bony  canal narrowing. No significant degenerative changes of the hips. IMPRESSION: VASCULAR No acute vascular abnormality identified. Advanced aortic atherosclerosis. There are associated changes at the origin of all mesenteric  vessel, though they remain patent. There may be 50 percent stenosis at the celiac artery origin, with greater than 50 percent stenosis of the superior mesenteric artery origin, and potentially greater than 50 percent stenosis at the origin of the IMA, although degree of stenosis cannot be determined by CTA. This pattern of disease certainly places the patient at risk for chronic mesenteric ischemia. Correlation with patient's history may be useful, and potentially mesenteric duplex exam if there is concern for compromise. Bilateral renal artery stenosis, potentially contributing to advanced renal cortical thinning. NON-VASCULAR Morphologic changes of the liver compatible with cirrhosis. Referral for establishing gastroenterology care may be considered if not already established, as well as consideration of initiating surveillance for hepatocellular carcinoma. Peritoneal dialysis catheter with free fluid likely associated with treatment. Tiny amount of anti dependent gas within the abdomen, again likely associated with the peritoneal dialysis. Again, bilateral renal cortical cysts. While these are most likely benign, there is incomplete characterization given the size. Developing renal osteodystrophy. Signed, Dulcy Fanny. Earleen Newport, DO [facility] Radiology Electronically Signed   By: Corrie Mckusick D.O.   On: 04/01/2016 09:00     CBC  Recent Labs Lab 04/02/2016 1623 04/06/2016 0512 04/11/16 0343 04/13/16 0554 04/14/16 0800  WBC 10.4 13.7* 12.5* 10.2 10.5  HGB 8.5* 8.1* 8.4* 9.4* 10.1*  HCT 27.8* 26.7* 27.9* 31.2* 34.3*  PLT 236 207 219 259 289  MCV 104.9* 105.1* 104.9* 106.1* 106.9*  MCH 32.1 31.9 31.6 32.0 31.5  MCHC 30.6 30.3 30.1 30.1 29.4*  RDW 17.5* 17.3* 17.4* 17.4* 17.8*  LYMPHSABS 0.7  --   --   --   --   MONOABS 0.5  --   --   --   --   EOSABS 1.5*  --   --   --   --   BASOSABS 0.1  --   --   --   --     Chemistries   Recent Labs Lab  04/11/2016 1623 03/26/2016 2244 04/18/2016 0512 04/11/16 0343 04/13/16 0554 04/14/16 0403 04/14/16 1209  NA 132*  --  132* 135 138 138  --   K 2.7*  --  3.5 3.8 4.4 3.9  --   CL 93*  --  103 104 102 100*  --   CO2 23  --  18* 18* 18* 21*  --   GLUCOSE 71  --  61* 90 135* 112*  --   BUN 35*  --  33* 42* 40* 22*  --   CREATININE 9.50*  --  9.11* 10.24* 8.69* 5.30*  --   CALCIUM 8.9  --  7.8* 7.9* 8.5* 8.3*  --   MG  --  1.6*  --   --  2.0  --   --   AST 62*  --  63* 52* 41  --  55*  ALT 33  --  31 30 32  --  35  ALKPHOS 77  --  69 92 79  --  92  BILITOT 0.4  --  0.6 0.9 1.2  --  0.8   ------------------------------------------------------------------------------------------------------------------ estimated creatinine clearance is 13.8 mL/min (by C-G formula based on SCr of 5.3 mg/dL (H)). ------------------------------------------------------------------------------------------------------------------ No results for input(s): HGBA1C in the last 72 hours. ------------------------------------------------------------------------------------------------------------------ No results for input(s): CHOL, HDL, LDLCALC, TRIG,  CHOLHDL, LDLDIRECT in the last 72 hours. ------------------------------------------------------------------------------------------------------------------ No results for input(s): TSH, T4TOTAL, T3FREE, THYROIDAB in the last 72 hours.  Invalid input(s): FREET3 ------------------------------------------------------------------------------------------------------------------ No results for input(s): VITAMINB12, FOLATE, FERRITIN, TIBC, IRON, RETICCTPCT in the last 72 hours.  Coagulation profile  Recent Labs Lab 04/03/2016 2244 04/15/16 0340  INR 1.34 1.19    No results for input(s): DDIMER in the last 72 hours.  Cardiac Enzymes No results for input(s): CKMB, TROPONINI, MYOGLOBIN in the last 168 hours.  Invalid input(s):  CK ------------------------------------------------------------------------------------------------------------------ Invalid input(s): POCBNP   CBG:  Recent Labs Lab 04/11/16 0810 04/11/16 1315 04/11/16 1756 04/11/16 2158 04/12/16 0747  GLUCAP 82 88 94 112* 126*       Studies: Ct Abdomen Pelvis W Contrast  Result Date: 04/14/2016 CLINICAL DATA:  Patient is poor historian. History of ascites. Sepsis on present a shin. Tachycardia, tachypnea and hypotension. EXAM: CT ABDOMEN AND PELVIS WITH CONTRAST TECHNIQUE: Multidetector CT imaging of the abdomen and pelvis was performed using the standard protocol following bolus administration of intravenous contrast. CONTRAST:  17mL ISOVUE-300 IOPAMIDOL (ISOVUE-300) INJECTION 61% COMPARISON:  04/02/2016, 04/01/2016, 11/26/2014, 08/06/2013 FINDINGS: Lower chest: Stable appearance of mild right middle lobe subpleural consolidation and fibrosis. Moderate emphysematous disease involving the inferior aspects of the upper lobes and the bilateral lower lobes. Persistent oblong areas of masslike consolidation along the left lower lobe posterior pleural surface and the pleural surface of the lingula laterally. These are similar in appearance compared with 04/02/2016, slightly decreased from 11/26/2014. Dense atherosclerotic vascular disease of the imaged portions of the aorta. Mildly ectatic ascending aorta, measuring up to 3.9 cm, unchanged. Marked coronary artery calcification. Heart size is slightly enlarged. No large pericardial effusion. Mitral valvular calcifications are also noted. Small amount of left greater than right gynecomastia. Hepatobiliary: Nodular contour of the liver, consistent with cirrhosis. No gross focal hepatic abnormalities. Surgical clips in the gallbladder fossa consistent with prior cholecystectomy. No biliary dilatation. Pancreas: Unremarkable. No pancreatic ductal dilatation or surrounding inflammatory changes. Marked vascular  calcifications. Spleen: Borderline enlargement of the spleen measuring 12 cm, unchanged. Now seen is a tiny slightly wedge-shaped relatively peripheral hypodensity in the posterior spleen measuring 9 mm. Normal splenic vein and portal vein enhancement. Adrenals/Urinary Tract: Mild nodular thickening of the left adrenal gland is unchanged. Kidneys are atrophic. No hydronephrosis. Stable hypodense lesion in the mid cortex of right kidney. Stable dominant cyst in the midpole of left kidney. Additional hypodense lesions in the left kidney as before. Stomach/Bowel: Stomach contains fluid. There is no dilated small bowel to suggest an obstruction. There is mild thickening of small bowel loops in the left hemi abdomen, series 2, image number 56. This does not appear significantly changed. No definite intramural air present within the thickened small bowel loop. There is diverticular disease of the sigmoid colon. Minimal wall thickening. Vascular/Lymphatic: Dense vascular calcifications within the aorta. No aneurysm. No pathologically enlarged mesenteric or pelvic nodes. Reproductive: Prostate is unremarkable. Other: Small amount of abdominal and pelvic ascites, decreased compared to the previous exam. The peritoneal pelvic dialysis catheter has been removed. No free air. Musculoskeletal: Degenerative changes of the spine. No acute osseous abnormality. IMPRESSION: 1. Moderate emphysematous disease at the bilateral lung bases with persistent ovoid areas of consolidation at the pleural surface of the lingula and left lower lobe posteriorly, findings were present dating back to 11/26/2014, query chronic consolidation/atelectasis with pleural based mass(es) also remaining in the differential. 2. Liver cirrhosis. Borderline enlarged spleen. Spleen now demonstrates a 9 mm  somewhat wedge-shaped peripheral opacity, question small infarct. Patent splenic and portal veins. 3. Removal of pelvic peritoneal dialysis catheter. There is  decreased abdominal and pelvic ascites. 4. Persistent thickened fluid-filled small bowel loops in the left hemi abdomen, possible inflammatory change. No definite intramural air or portal venous gas is present. 5. Atrophic kidney with bilateral hypodense lesions probable cyst as before. 6. Sigmoid diverticulosis with slight wall thickening of the sigmoid colon. Electronically Signed   By: Donavan Foil M.D.   On: 04/14/2016 18:20      Lab Results  Component Value Date   HGBA1C 5.5 04/01/2016   HGBA1C 6.3 (H) 08/11/2013   Lab Results  Component Value Date   CREATININE 5.30 (H) 04/14/2016       Scheduled Meds: . aspirin EC  81 mg Oral Daily  . darbepoetin (ARANESP) injection - DIALYSIS  60 mcg Intravenous Q Sat-HD  . famotidine  20 mg Oral Daily  . feeding supplement (NEPRO CARB STEADY)  237 mL Oral BID BM  . fluconazole (DIFLUCAN) IV  200 mg Intravenous Q24H  . heparin subcutaneous  5,000 Units Subcutaneous Q8H  . ipratropium  2 spray Each Nare BID  . lidocaine  1 patch Transdermal Q24H  . mouth rinse  15 mL Mouth Rinse BID  .  morphine injection  2 mg Intravenous Once  . multivitamin  1 tablet Oral QHS  . sodium chloride flush  3 mL Intravenous Q12H   Continuous Infusions:     LOS: 7 days    Time spent: >30 MINS    [facility]  [facility] Pager [phone]. If 7PM-7AM, please contact night-coverage at [email or web address], password [facility] 04/16/2016, 11:41 AM  LOS: 7 days

## 2016-04-16 NOTE — Progress Notes (Signed)
SLP Cancellation Note  Patient Details Name: Victor Lane MRN: [id number] DOB: December 02, 1945   Cancelled treatment:       Reason Eval/Treat Not Completed: Patient's level of consciousness. Attempted modified barium swallow, pt unable to participate. He was extremely lethargic and when staff tried to wake him he began thrashing around. Will f/u as able.   Herbie [place], [place] CCC-SLP [phone]  Lynann Beaver 04/16/2016, 10:29 AM

## 2016-04-16 NOTE — Progress Notes (Addendum)
Palliative:  Mr. Heaster is having MBS and then HD when I attempted to visit. I have ordered Lidoderm patch for low back pain. May need prn low dose fentanyl if still NPO to assist with pain if this is contributing to his agitation.   Per conversation with wife, she remains very hopeful and does desire nutrition and is hopeful for to be able to have po soon but did seem open to temporary feeding tube as well (even though I know he would likely pull this out). Full scope treatment is desired. Please call [phone] for palliative follow up over the weekend.   No Charge  Vinie Sill, NP Palliative Medicine Team Pager # [phone] (M-F 8a-5p) Team Phone # [phone] (Nights/Weekends)

## 2016-04-16 NOTE — Progress Notes (Signed)
Pt noted very restless, agitated, yelling out help and several attempts to get out of bed. Unable to redirect patient. Pt noted hyperventilating O2sats at 88% to room air. Respiratory therapists made aware. Placed a nonrebreather mask with rechecked O2 sats at 100%. Also noted with nonsustained Vtach. K.Schorr,NP made aware with new order to give ativan IM. Pt calmed down after Ativan given at 2339. Pt currently resting in bed with eyes closed. Bed alarm on. Will cont. to monitor.

## 2016-04-16 NOTE — Progress Notes (Signed)
Subjective: Interval History: has complaints not resting well but does not say much.  Objective: Vital signs in last 24 hours: Temp:  [97.5 F (36.4 C)-98.8 F (37.1 C)] 97.5 F (36.4 C) (10/26 0452) Pulse Rate:  [106-135] 115 (10/26 0452) Resp:  [18-22] 22 (10/26 0452) BP: (116-176)/(51-85) 133/66 (10/26 0452) SpO2:  [88 %-100 %] 100 % (10/26 0452) Weight change:   Intake/Output from previous day: 10/25 0701 - 10/26 0700 In: 100 [IV Piggyback:100] Out: -  Intake/Output this shift: No intake/output data recorded.  General appearance: delirious, no distress and aggitated Resp: diminished breath sounds bilaterally Cardio: S1, S2 normal and systolic murmur: systolic ejection 2/6, decrescendo at 2nd left intercostal space GI: pos bs, mild tender, incision lower abdm clean,  smaller on in Lmid abdm clean Extremities: AVF LUA b&t  Lab Results:  Recent Labs  04/14/16 0800  WBC 10.5  HGB 10.1*  HCT 34.3*  PLT 289   BMET:  Recent Labs  04/14/16 0403  NA 138  K 3.9  CL 100*  CO2 21*  GLUCOSE 112*  BUN 22*  CREATININE 5.30*  CALCIUM 8.3*   No results for input(s): PTH in the last 72 hours. Iron Studies: No results for input(s): IRON, TIBC, TRANSFERRIN, FERRITIN in the last 72 hours.  Studies/Results: Ct Abdomen Pelvis W Contrast  Result Date: 04/14/2016 CLINICAL DATA:  Patient is poor historian. History of ascites. Sepsis on present a shin. Tachycardia, tachypnea and hypotension. EXAM: CT ABDOMEN AND PELVIS WITH CONTRAST TECHNIQUE: Multidetector CT imaging of the abdomen and pelvis was performed using the standard protocol following bolus administration of intravenous contrast. CONTRAST:  11mL ISOVUE-300 IOPAMIDOL (ISOVUE-300) INJECTION 61% COMPARISON:  04/02/2016, 04/01/2016, 11/26/2014, 08/06/2013 FINDINGS: Lower chest: Stable appearance of mild right middle lobe subpleural consolidation and fibrosis. Moderate emphysematous disease involving the inferior aspects of  the upper lobes and the bilateral lower lobes. Persistent oblong areas of masslike consolidation along the left lower lobe posterior pleural surface and the pleural surface of the lingula laterally. These are similar in appearance compared with 04/02/2016, slightly decreased from 11/26/2014. Dense atherosclerotic vascular disease of the imaged portions of the aorta. Mildly ectatic ascending aorta, measuring up to 3.9 cm, unchanged. Marked coronary artery calcification. Heart size is slightly enlarged. No large pericardial effusion. Mitral valvular calcifications are also noted. Small amount of left greater than right gynecomastia. Hepatobiliary: Nodular contour of the liver, consistent with cirrhosis. No gross focal hepatic abnormalities. Surgical clips in the gallbladder fossa consistent with prior cholecystectomy. No biliary dilatation. Pancreas: Unremarkable. No pancreatic ductal dilatation or surrounding inflammatory changes. Marked vascular calcifications. Spleen: Borderline enlargement of the spleen measuring 12 cm, unchanged. Now seen is a tiny slightly wedge-shaped relatively peripheral hypodensity in the posterior spleen measuring 9 mm. Normal splenic vein and portal vein enhancement. Adrenals/Urinary Tract: Mild nodular thickening of the left adrenal gland is unchanged. Kidneys are atrophic. No hydronephrosis. Stable hypodense lesion in the mid cortex of right kidney. Stable dominant cyst in the midpole of left kidney. Additional hypodense lesions in the left kidney as before. Stomach/Bowel: Stomach contains fluid. There is no dilated small bowel to suggest an obstruction. There is mild thickening of small bowel loops in the left hemi abdomen, series 2, image number 56. This does not appear significantly changed. No definite intramural air present within the thickened small bowel loop. There is diverticular disease of the sigmoid colon. Minimal wall thickening. Vascular/Lymphatic: Dense vascular  calcifications within the aorta. No aneurysm. No pathologically enlarged mesenteric or pelvic  nodes. Reproductive: Prostate is unremarkable. Other: Small amount of abdominal and pelvic ascites, decreased compared to the previous exam. The peritoneal pelvic dialysis catheter has been removed. No free air. Musculoskeletal: Degenerative changes of the spine. No acute osseous abnormality. IMPRESSION: 1. Moderate emphysematous disease at the bilateral lung bases with persistent ovoid areas of consolidation at the pleural surface of the lingula and left lower lobe posteriorly, findings were present dating back to 11/26/2014, query chronic consolidation/atelectasis with pleural based mass(es) also remaining in the differential. 2. Liver cirrhosis. Borderline enlarged spleen. Spleen now demonstrates a 9 mm somewhat wedge-shaped peripheral opacity, question small infarct. Patent splenic and portal veins. 3. Removal of pelvic peritoneal dialysis catheter. There is decreased abdominal and pelvic ascites. 4. Persistent thickened fluid-filled small bowel loops in the left hemi abdomen, possible inflammatory change. No definite intramural air or portal venous gas is present. 5. Atrophic kidney with bilateral hypodense lesions probable cyst as before. 6. Sigmoid diverticulosis with slight wall thickening of the sigmoid colon. Electronically Signed   By: Donavan Foil M.D.   On: 04/14/2016 18:20    I have reviewed the patient's current medications.  Assessment/Plan: 1 ESRD for HD.   2 Fungal peritonitis clinically improving, porbably can use po diflucan 3 anemia  esa 4 HPTH vit d when can take 5 Encephalopathy ??? Medication.  This is not his baseline,  May be slow to resolve P Hd, esa, diflucan    LOS: 7 days   Bradi Arbuthnot L 04/16/2016,11:17 AM

## 2016-04-17 ENCOUNTER — Inpatient Hospital Stay (HOSPITAL_COMMUNITY): Payer: Medicare Other

## 2016-04-17 DIAGNOSIS — I517 Cardiomegaly: Secondary | ICD-10-CM

## 2016-04-17 LAB — ECHOCARDIOGRAM COMPLETE
HEIGHTINCHES: 71 in
Weight: 2617.3 oz

## 2016-04-17 LAB — COMPREHENSIVE METABOLIC PANEL
ALK PHOS: 90 U/L (ref 38–126)
ALT: 35 U/L (ref 17–63)
AST: 56 U/L — ABNORMAL HIGH (ref 15–41)
Albumin: 2 g/dL — ABNORMAL LOW (ref 3.5–5.0)
Anion gap: 16 — ABNORMAL HIGH (ref 5–15)
BILIRUBIN TOTAL: 1.3 mg/dL — AB (ref 0.3–1.2)
BUN: 20 mg/dL (ref 6–20)
CALCIUM: 8.9 mg/dL (ref 8.9–10.3)
CO2: 25 mmol/L (ref 22–32)
CREATININE: 4.46 mg/dL — AB (ref 0.61–1.24)
Chloride: 98 mmol/L — ABNORMAL LOW (ref 101–111)
GFR, EST AFRICAN AMERICAN: 14 mL/min — AB (ref >60)
GFR, EST NON AFRICAN AMERICAN: 12 mL/min — AB (ref >60)
Glucose, Bld: 73 mg/dL (ref 65–99)
Potassium: 4.6 mmol/L (ref 3.5–5.1)
Sodium: 139 mmol/L (ref 135–145)
TOTAL PROTEIN: 5.9 g/dL — AB (ref 6.5–8.1)

## 2016-04-17 LAB — CBC
HCT: 36.8 % — ABNORMAL LOW (ref 39.0–52.0)
HEMOGLOBIN: 10.5 g/dL — AB (ref 13.0–17.0)
MCH: 31.3 pg (ref 26.0–34.0)
MCHC: 28.5 g/dL — ABNORMAL LOW (ref 30.0–36.0)
MCV: 109.5 fL — AB (ref 78.0–100.0)
Platelets: 235 10*3/uL (ref 150–400)
RBC: 3.36 MIL/uL — AB (ref 4.22–5.81)
RDW: 18.1 % — ABNORMAL HIGH (ref 11.5–15.5)
WBC: 13.5 10*3/uL — ABNORMAL HIGH (ref 4.0–10.5)

## 2016-04-17 LAB — C DIFFICILE QUICK SCREEN W PCR REFLEX
C DIFFICILE (CDIFF) INTERP: NOT DETECTED
C DIFFICILE (CDIFF) TOXIN: NEGATIVE
C Diff antigen: NEGATIVE

## 2016-04-17 LAB — HEPATITIS B SURFACE ANTIGEN: Hepatitis B Surface Ag: NEGATIVE

## 2016-04-17 MED ORDER — HYDROMORPHONE HCL 2 MG/ML IJ SOLN
0.5000 mg | Freq: Once | INTRAMUSCULAR | Status: AC
  Administered 2016-04-17: 0.5 mg via INTRAVENOUS
  Filled 2016-04-17: qty 1

## 2016-04-17 MED ORDER — RESOURCE THICKENUP CLEAR PO POWD
ORAL | Status: DC | PRN
  Filled 2016-04-17: qty 125

## 2016-04-17 MED ORDER — SACCHAROMYCES BOULARDII 250 MG PO CAPS
250.0000 mg | ORAL_CAPSULE | Freq: Two times a day (BID) | ORAL | Status: DC
  Administered 2016-04-17 – 2016-04-19 (×4): 250 mg via ORAL
  Filled 2016-04-17 (×4): qty 1

## 2016-04-17 NOTE — Progress Notes (Signed)
  Echocardiogram 2D Echocardiogram has been performed.  Victor Lane 04/17/2016, 10:07 AM

## 2016-04-17 NOTE — Progress Notes (Signed)
[facility] PROGRESS NOTE  Victor Lane [id number] DOB: 13-Feb-1946 DOA: 03/22/2016   PCP: Beatris Si     Assessment/Plan: Principal Problem:   Peritonitis ([place]) Active Problems:   HTN (hypertension)   Anemia of chronic disease   Continuous ambulatory peritoneal dialysis status ([place])   ESRD on dialysis ([place])   Dilated aortic root (HCC)   CAD (coronary artery disease), native coronary artery   Dyslipidemia   Chronic systolic congestive heart failure (HCC)   Carotid stenosis   Abdominal pain   Diabetes mellitus with complication (HCC)   Hypokalemia   Acute encephalopathy   Acute abdomen   Back pain   Palliative care encounter     70 y.o. 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  He presents to ED sent by renal MD for PD cath removal for refractory culture negative peritonitis. Per chart last ECHO shows EF 30%. Hypotensive on arrival. Possible small left lower lobe infiltrate. Continues to complain of abdominal pain.Also noted to have dysphagia due to hx of radiation to upper airway(vocal cord cancer ).MBS ,now on modified diet . Hospitalization prolonged due to encephalopathy     Assessment and plan Peritonitis (HCC)/left lower lobe pneumonia / sepsis:  his ascites fluid cultures from 10/11 was negative, but has high cell count in fluid, indicating persistent or recurrent peritonitis. Pt was suspected to have sepsis on presentation with elevated lactate, tachycardia, tachypnea and hypotension. Blood pressure responded to IV fluid resuscitation,   Nephrology following Status pos removal of PD cath by general surgery  10/20   Hemodynamics have improved with confusion has not Currently on vancomycin, Zosyn with hemodialysis since 10/19, along  with  Diflucan [outpatient  culture suspicious for   Yeast , identification pending] Duration of treatment-may need infectious disease consult Blood culture from 10/19 negative, culture from peritoneal washings 10/11, 10/8  Negative    Nonsustained VT Patient has a very low EF, 30-35%, repeat 2-D echo shows EF is 20-25% Patient is already on Diflucan, should not add any other medication to prolong QTC Haldol discontinued  Abdominal pain Severe abdominal pain with HD 10/25 ,   CT 10/24 Spleen shows  9 mm somewhat wedge-shaped peripheral opacity,question small infarct. Patent splenic and portal veins, nl LFT's , lipase  -Etiology unclear, but suspect this is multifactorial including recent infectious gastroenteritis, constipation, peritonitis, possible mesenteric ischemia Evaluated by  vascular surgery 10/11 --low suspicion of mesenteric ischemia causing abdominal pain -04/01/2016 CT angiogram abdomen--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, celiac origin, IMA -03/28/2016 and 04/01/2016 CTs of the abdomen negative for abscess -04/02/16 CT angio abd--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, celiac origin, IMA unchanged--?SB thickening related to PD fluid/cirrhosis   seen by GI ([place]) 10/13- did not feel there was any intraabd castastrophe--continue medical therapy  seen by general surgery consult--no indication for exploratory laparoscopy   -04/04/16--pt and wife wanted to leave AMA--discussed with Dr. Jobie Quaker ceftazidime 2g and follow up 04/06/16 with home teaching at dialysis center  given findings of a small infarct in the spleen , repeat 2-D echo  EF: 20% -   25%,Akinesis of the  apicalanteroseptal and apical myocardium , probably will need TEE to r/o embolic source , to risky for him to undergo sedation  Acute metabolic encephalopathy:  continues to have intermittent confusion Need to rule out ongoing infection, repeat chest x-ray to evaluate left pleural effusion Continue to treat underlying infection MRI of the brain  to rule out CVA, patient needs a lumbar puncture , as per nephrology Ammonia level within normal limits LP negative so far ,  CSF fluid cx since 10/24 NGSF    ESRD-on PD: None hemodialysis per Dr. Jonnie Finner,   Transitioned PD to HD,AVF functional L arm. HD as per nephrology recommendations  clipped to [facility]   Hypokalemia:  , resolved Repleted   CAD: s/p of stent. No CP -continue aspirin per rectal, IV metoprolol  HTN: -hold oral meds -IV hydralazine when necessary and IV metoprolol with holding parameters.  Chronic systolic congestive heart failure:  . Patient does not have leg edema or JVD. CHF seems to be compensated on admission. Pt is not on diuretics at home. Volume status being managed with hemodialysis Repeat echo results as above    DM-II: Last A1c 5.5, well controled. Patient is taking glipizide at home CBGs have remained stable, will DC Accu-Cheks   Back pain-patient's wife states that patient had a recent fall Plain films show spondylosis of the lumbar spine, no fracture   Dysphagia-patient deemed to be high risk for aspiration,now on Dysphagia 1 (puree);Nectar-thick liquid patient has had difficulty swallowing medications,MBS swallow pending, patient is confused to participate. Previous history of radiation therapy to vocal cords     DVT prophylaxsis heparin  Code Status:  Full code    Family Communication: Discussed in detail with the patient's wife , all imaging results, lab results explained to the patient   Disposition   remains inpatient, palliative care consult for goals of care given   multiple comorbidities and no signs of improvement, they want full scope of treatment      Consultants:  General surgery  Nephrology  Procedures:  None  Antibiotics: Anti-infectives    Start     Dose/Rate Route Frequency Ordered Stop   04/16/16 2200  fluconazole (DIFLUCAN) tablet 200 mg     200 mg Oral Daily at bedtime 04/16/16 1439     04/14/16 1200   vancomycin (VANCOCIN) IVPB 1000 mg/200 mL premix  Status:  Discontinued     1,000 mg 200 mL/hr over 60 Minutes Intravenous Every T-Th-Sa (Hemodialysis) 04/11/16 1447 04/12/16 1259   04/11/16 1800  vancomycin (VANCOCIN) IVPB 1000 mg/200 mL premix     1,000 mg 200 mL/hr over 60 Minutes Intravenous  Once 04/11/16 1447 04/11/16 2022   04/11/16 1330  piperacillin-tazobactam (ZOSYN) IVPB 3.375 g  Status:  Discontinued     3.375 g 12.5 mL/hr over 240 Minutes Intravenous Every 12 hours 04/11/16 1315 04/12/16 1259   04/11/16 1000  piperacillin-tazobactam (ZOSYN) IVPB 3.375 g  Status:  Discontinued     3.375 g 12.5 mL/hr over 240 Minutes Intravenous Every 12 hours 04/11/16 0353 04/11/16 1315   04/04/2016 2300  fluconazole (DIFLUCAN) IVPB 200 mg  Status:  Discontinued     200 mg 100 mL/hr over 60 Minutes Intravenous Every 24 hours 04/21/2016 2232 04/16/16 1439   04/01/2016 2200  piperacillin-tazobactam (ZOSYN) IVPB 2.25 g  Status:  Discontinued     2.25 g 100 mL/hr over 30 Minutes Intravenous Every 6 hours 03/31/2016 1622 04/11/16 0353   03/22/2016 1615  piperacillin-tazobactam (ZOSYN) IVPB 3.375 g     3.375 g 100 mL/hr over 30 Minutes Intravenous  Once 03/30/2016 1609 04/21/2016 1756   04/20/2016 1615  vancomycin (  VANCOCIN) 1,500 mg in sodium chloride 0.9 % 500 mL IVPB     1,500 mg 250 mL/hr over 120 Minutes Intravenous  Once 04/08/2016 1609 03/30/2016 2140          HPI/Subjective:   No NSVT since 10/25  Objective: Vitals:   04/16/16 1630 04/16/16 1640 04/16/16 2102 04/17/16 0552  BP: 105/80 123/88 (!) 157/58 131/64  Pulse: 95 100 96 97  Resp: (!) 21 16 15 16   Temp:  97.4 F (36.3 C) 97.1 F (36.2 C) 97.4 F (36.3 C)  TempSrc:  Oral Axillary Oral  SpO2:  100% 97% 100%  Weight:  74.2 kg (163 lb 9.3 oz)    Height:        Intake/Output Summary (Last 24 hours) at 04/17/16 1235 Last data filed at 04/17/16 0600  Gross per 24 hour  Intake                0 ml  Output             1000 ml  Net             -1000 ml    Exam:  Examination:  General exam: Confused  Respiratory system: Clear to auscultation. Respiratory effort normal. Cardiovascular system: S1 & S2 heard, RRR. No JVD, murmurs, rubs, gallops or clicks. No pedal edema. Gastrointestinal system: Abdomen is nondistended, soft and nontender. No organomegaly or masses felt. Normal bowel sounds heard. Central nervous system:  . No focal neurological deficits. Extremities: Symmetric 5 x 5 power. Skin: No rashes, lesions or ulcers Psychiatry: Judgement and insight appear normal. Mood & affect appropriate.     Data Reviewed: I have personally reviewed following labs and imaging studies  Micro Results Recent Results (from the past 240 hour(s))  C difficile quick scan w PCR reflex     Status: None   Collection Time: 04/02/2016  4:07 PM  Result Value Ref Range Status   C Diff antigen NEGATIVE NEGATIVE Final   C Diff toxin NEGATIVE NEGATIVE Final   C Diff interpretation No C. difficile detected.  Final  Gastrointestinal Panel by PCR , Stool     Status: None   Collection Time: 03/26/2016  4:07 PM  Result Value Ref Range Status   Campylobacter species NOT DETECTED NOT DETECTED Final   Plesimonas shigelloides NOT DETECTED NOT DETECTED Final   Salmonella species NOT DETECTED NOT DETECTED Final   Yersinia enterocolitica NOT DETECTED NOT DETECTED Final   Vibrio species NOT DETECTED NOT DETECTED Final   Vibrio cholerae NOT DETECTED NOT DETECTED Final   Enteroaggregative E coli (EAEC) NOT DETECTED NOT DETECTED Final   Enteropathogenic E coli (EPEC) NOT DETECTED NOT DETECTED Final   Enterotoxigenic E coli (ETEC) NOT DETECTED NOT DETECTED Final   Shiga like toxin producing E coli (STEC) NOT DETECTED NOT DETECTED Final   Shigella/Enteroinvasive E coli (EIEC) NOT DETECTED NOT DETECTED Final   Cryptosporidium NOT DETECTED NOT DETECTED Final   Cyclospora cayetanensis NOT DETECTED NOT DETECTED Final   Entamoeba histolytica NOT DETECTED NOT  DETECTED Final   Giardia lamblia NOT DETECTED NOT DETECTED Final   Adenovirus F40/41 NOT DETECTED NOT DETECTED Final   Astrovirus NOT DETECTED NOT DETECTED Final   Norovirus GI/GII NOT DETECTED NOT DETECTED Final   Rotavirus A NOT DETECTED NOT DETECTED Final   Sapovirus (I, II, IV, and V) NOT DETECTED NOT DETECTED Final  Blood Culture (routine x 2)     Status: None   Collection Time: 04/18/2016  4:17 PM  Result Value Ref Range Status   Specimen Description BLOOD RIGHT HAND  Final   Special Requests BOTTLES DRAWN AEROBIC AND ANAEROBIC 5CC  Final   Culture NO GROWTH 5 DAYS  Final   Report Status 04/14/2016 FINAL  Final  Blood Culture (routine x 2)     Status: None   Collection Time: 03/31/2016  4:23 PM  Result Value Ref Range Status   Specimen Description BLOOD LEFT ANTECUBITAL  Final   Special Requests BOTTLES DRAWN AEROBIC AND ANAEROBIC 5CC  Final   Culture NO GROWTH 5 DAYS  Final   Report Status 04/14/2016 FINAL  Final  MRSA PCR Screening     Status: None   Collection Time: 04/04/2016 10:29 AM  Result Value Ref Range Status   MRSA by PCR NEGATIVE NEGATIVE Final    Comment:        The GeneXpert MRSA Assay (FDA approved for NASAL specimens only), is one component of a comprehensive MRSA colonization surveillance program. It is not intended to diagnose MRSA infection nor to guide or monitor treatment for MRSA infections.   CSF culture     Status: None (Preliminary result)   Collection Time: 04/14/16 12:54 PM  Result Value Ref Range Status   Specimen Description CSF  Final   Special Requests TUBE 2  Final   Gram Stain   Final    WBC PRESENT, PREDOMINANTLY MONONUCLEAR NO ORGANISMS SEEN CYTOSPIN    Culture NO GROWTH 3 DAYS  Final   Report Status PENDING  Incomplete  C difficile quick scan w PCR reflex     Status: None   Collection Time: 04/17/16  4:11 AM  Result Value Ref Range Status   C Diff antigen NEGATIVE NEGATIVE Final   C Diff toxin NEGATIVE NEGATIVE Final   C Diff  interpretation No C. difficile detected.  Final    Radiology Reports Dg Chest 1 View  Result Date: 04/13/2016 CLINICAL DATA:  Sharp chest pain EXAM: CHEST 1 VIEW COMPARISON:  04/18/2016 FINDINGS: Cardiomegaly and congested vascular appearance. Trace bilateral pleural fluid or thickening. Extensive atherosclerosis of the aorta. Multiple stents of the left axilla. IMPRESSION: Cardiomegaly and pulmonary venous congestion. Electronically Signed   By: Monte Fantasia M.D.   On: 04/13/2016 10:05   Dg Chest 2 View  Result Date: 04/01/2016 CLINICAL DATA:  Suspected sepsis.  Peritoneal dialysis patient. EXAM: CHEST  2 VIEW COMPARISON:  Chest radiograph 07/19/2015.  Abdominal CT 03/28/2016 FINDINGS: Stable cardiomegaly and mediastinal contours. There is thoracic aortic atherosclerosis. Small bilateral pleural effusions and bibasilar airspace disease. No pulmonary edema. No pneumothorax. Vascular stent in the left axilla. Free air under the right hemidiaphragm, likely related to peritoneal dialysis. Resorption of the distal right clavicle. IMPRESSION: Chronic cardiomegaly, stable.  Thoracic aortic atherosclerosis. Small bilateral pleural effusions with bibasilar airspace disease, likely atelectasis. Electronically Signed   By: Jeb Levering M.D.   On: 04/01/2016 05:40   Dg Thoracic Spine 2 View  Result Date: 04/12/2016 CLINICAL DATA:  70 year old male with history of back pain and weight loss. EXAM: THORACIC SPINE 2 VIEWS COMPARISON:  No priors. FINDINGS: There is no evidence of thoracic spine fracture. Alignment is normal. Mild multilevel degenerative disc disease, typical for the patient's age. No other significant bone abnormalities are identified. Aortic atherosclerosis. Vascular stent projecting over the left axillary region, incompletely visualized. IMPRESSION: 1. No acute radiographic abnormality of the thoracic spine. 2. Aortic atherosclerosis. Electronically Signed   By: Vinnie Langton M.D.   On:  04/12/2016 15:19   Dg Lumbar Spine 2-3 Views  Result Date: 04/12/2016 CLINICAL DATA:  Back pain lumbar region. EXAM: LUMBAR SPINE - 2-3 VIEW COMPARISON:  04/04/2013 FINDINGS: Vertebral body alignment, heights and disc space heights are normal. There is mild spondylosis throughout the lumbar spine to include facet arthropathy over the lower lumbar spine. No compression fracture or subluxation. Moderate calcified plaque over the abdominal aorta and iliac arteries IMPRESSION: Mild spondylosis of the lumbar spine to include facet arthropathy. Electronically Signed   By: Marin Olp M.D.   On: 04/12/2016 15:25   Dg Pelvis 1-2 Views  Result Date: 04/12/2016 CLINICAL DATA:  Low back pain. Unable to obtain history from patient. EXAM: PELVIS - 1-2 VIEW COMPARISON:  None. FINDINGS: Mild symmetric degenerative change of the hips. Mild diffuse decreased bone mineralization is present. There is no acute fracture or dislocation. Skin staples are present over the soft tissues of the left hip region. Several pelvic phleboliths. Calcified plaque over the iliac and femoral arteries. IMPRESSION: No acute findings. Electronically Signed   By: Marin Olp M.D.   On: 04/12/2016 15:26   Mr Brain Wo Contrast  Result Date: 04/13/2016 CLINICAL DATA:  Altered mental status. EXAM: MRI HEAD WITHOUT CONTRAST TECHNIQUE: Multiplanar, multiecho pulse sequences of the brain and surrounding structures were obtained without intravenous contrast. COMPARISON:  None FINDINGS: Brain: Motion degraded exam. Only sagittal T1 weighted imaging, axial diffusion, axial T2, and axial FLAIR sequences could be obtained before patient refused to continue. No acute infarct, hemorrhage, hydrocephalus, or swelling. Small-vessel ischemic change in the deep cerebral white matter, mild for age. Remote lacunar infarct in the left caudate. Mild generalized cerebral volume loss. Vascular: Normal flow voids. Skull and upper cervical spine: Normal marrow  signal. Sinuses/Orbits: Chronic sinusitis including complete opacification of the right maxillary antrum with inspissated secretions. Mild mucosal thickening in the mastoids. IMPRESSION: 1. No acute finding, including infarct. 2. Generalized atrophy and mild chronic microvascular ischemic change. 3. Partial, motion degraded exam. Electronically Signed   By: Monte Fantasia M.D.   On: 04/13/2016 10:41   Dg Chest Left Decubitus  Result Date: 04/13/2016 CLINICAL DATA:  Sharp chest pain. EXAM: CHEST - LEFT DECUBITUS COMPARISON:  Chest x-ray from earlier today FINDINGS: Trace bilateral pleural fluid or thickening. No significant layering fluid on left side down decubitus. No right-sided pneumothorax is noted. Cardiomegaly and vascular congestion. IMPRESSION: Left side down decubitus shows no significant layering pleural fluid or right-sided pneumothorax. Electronically Signed   By: Monte Fantasia M.D.   On: 04/13/2016 10:04   Ct Abdomen Pelvis W Contrast  Result Date: 04/14/2016 CLINICAL DATA:  Patient is poor historian. History of ascites. Sepsis on present a shin. Tachycardia, tachypnea and hypotension. EXAM: CT ABDOMEN AND PELVIS WITH CONTRAST TECHNIQUE: Multidetector CT imaging of the abdomen and pelvis was performed using the standard protocol following bolus administration of intravenous contrast. CONTRAST:  166mL ISOVUE-300 IOPAMIDOL (ISOVUE-300) INJECTION 61% COMPARISON:  04/02/2016, 04/01/2016, 11/26/2014, 08/06/2013 FINDINGS: Lower chest: Stable appearance of mild right middle lobe subpleural consolidation and fibrosis. Moderate emphysematous disease involving the inferior aspects of the upper lobes and the bilateral lower lobes. Persistent oblong areas of masslike consolidation along the left lower lobe posterior pleural surface and the pleural surface of the lingula laterally. These are similar in appearance compared with 04/02/2016, slightly decreased from 11/26/2014. Dense atherosclerotic  vascular disease of the imaged portions of the aorta. Mildly ectatic ascending aorta, measuring up to 3.9 cm, unchanged. Marked coronary artery calcification. Heart  size is slightly enlarged. No large pericardial effusion. Mitral valvular calcifications are also noted. Small amount of left greater than right gynecomastia. Hepatobiliary: Nodular contour of the liver, consistent with cirrhosis. No gross focal hepatic abnormalities. Surgical clips in the gallbladder fossa consistent with prior cholecystectomy. No biliary dilatation. Pancreas: Unremarkable. No pancreatic ductal dilatation or surrounding inflammatory changes. Marked vascular calcifications. Spleen: Borderline enlargement of the spleen measuring 12 cm, unchanged. Now seen is a tiny slightly wedge-shaped relatively peripheral hypodensity in the posterior spleen measuring 9 mm. Normal splenic vein and portal vein enhancement. Adrenals/Urinary Tract: Mild nodular thickening of the left adrenal gland is unchanged. Kidneys are atrophic. No hydronephrosis. Stable hypodense lesion in the mid cortex of right kidney. Stable dominant cyst in the midpole of left kidney. Additional hypodense lesions in the left kidney as before. Stomach/Bowel: Stomach contains fluid. There is no dilated small bowel to suggest an obstruction. There is mild thickening of small bowel loops in the left hemi abdomen, series 2, image number 56. This does not appear significantly changed. No definite intramural air present within the thickened small bowel loop. There is diverticular disease of the sigmoid colon. Minimal wall thickening. Vascular/Lymphatic: Dense vascular calcifications within the aorta. No aneurysm. No pathologically enlarged mesenteric or pelvic nodes. Reproductive: Prostate is unremarkable. Other: Small amount of abdominal and pelvic ascites, decreased compared to the previous exam. The peritoneal pelvic dialysis catheter has been removed. No free air. Musculoskeletal:  Degenerative changes of the spine. No acute osseous abnormality. IMPRESSION: 1. Moderate emphysematous disease at the bilateral lung bases with persistent ovoid areas of consolidation at the pleural surface of the lingula and left lower lobe posteriorly, findings were present dating back to 11/26/2014, query chronic consolidation/atelectasis with pleural based mass(es) also remaining in the differential. 2. Liver cirrhosis. Borderline enlarged spleen. Spleen now demonstrates a 9 mm somewhat wedge-shaped peripheral opacity, question small infarct. Patent splenic and portal veins. 3. Removal of pelvic peritoneal dialysis catheter. There is decreased abdominal and pelvic ascites. 4. Persistent thickened fluid-filled small bowel loops in the left hemi abdomen, possible inflammatory change. No definite intramural air or portal venous gas is present. 5. Atrophic kidney with bilateral hypodense lesions probable cyst as before. 6. Sigmoid diverticulosis with slight wall thickening of the sigmoid colon. Electronically Signed   By: Donavan Foil M.D.   On: 04/14/2016 18:20   Ct Abdomen Pelvis W Contrast  Result Date: 03/29/2016 CLINICAL DATA:  Acute onset of generalized abdominal pain. Recent peritoneal dialysis. Vomiting and diarrhea. Initial encounter. EXAM: CT ABDOMEN AND PELVIS WITH CONTRAST TECHNIQUE: Multidetector CT imaging of the abdomen and pelvis was performed using the standard protocol following bolus administration of intravenous contrast. CONTRAST:  185mL ISOVUE-300 IOPAMIDOL (ISOVUE-300) INJECTION 61% COMPARISON:  CT of the abdomen and pelvis from 11/26/2014 FINDINGS: Lower chest: Trace right-sided pleural fluid is noted. Bibasilar atelectasis or scarring is seen. The visualized portions of the mediastinum are unremarkable. Hepatobiliary: A small amount of ascites is seen tracking about the liver. The liver is unremarkable in appearance. The patient is status post cholecystectomy, with clips noted at the  gallbladder fossa. The common bile duct remains normal in caliber. Pancreas: The pancreas is within normal limits. Spleen: The spleen is unremarkable in appearance. Adrenals/Urinary Tract: The adrenal glands are grossly unremarkable in appearance. Severe chronic bilateral renal atrophy is noted, with scattered bilateral renal cysts. There is no evidence of hydronephrosis. No renal or ureteral stones are identified. No significant perinephric stranding is seen. Stomach/Bowel: The stomach is unremarkable  in appearance. The small bowel is within normal limits. The appendix is normal in caliber, without evidence of appendicitis. Scattered diverticulosis is noted along the distal descending and sigmoid colon, without definite evidence of diverticulitis. Vascular/Lymphatic: Diffuse calcification is seen along the abdominal aorta and its branches. There is relatively severe luminal narrowing at the proximal right common iliac artery, and moderate luminal narrowing along the left common iliac artery. Relatively severe luminal narrowing is also noted along the proximal left superficial femoral artery. No retroperitoneal or pelvic sidewall lymphadenopathy is seen. Reproductive: The bladder is mildly distended and grossly unremarkable. The prostate remains normal in size. Other: The patient's peritoneal dialysis catheter is noted within the pelvis, with a small to moderate amount of free fluid in the pelvis and along both paracolic gutters. Musculoskeletal: No acute osseous abnormalities are identified. The visualized musculature is unremarkable in appearance. IMPRESSION: 1. No acute abnormality seen to explain the patient's symptoms. 2. Status post recent peritoneal dialysis. Small to moderate volume ascites noted within the abdomen and pelvis. 3. Diffuse aortic atherosclerosis noted. Relatively severe luminal narrowing at the proximal right common iliac artery, and moderate luminal narrowing along the left common iliac  artery. Relatively severe luminal narrowing also noted along the proximal left superficial femoral artery. 4. Severe chronic bilateral renal atrophy, with scattered bilateral renal cysts. 5. Scattered diverticulosis along the distal descending and sigmoid colon, without definite evidence of diverticulitis. 6. Trace right-sided pleural fluid. Bibasilar atelectasis or scarring noted. Electronically Signed   By: Garald Balding M.D.   On: 03/29/2016 00:10   Dg Chest Port 1 View  Result Date: 03/27/2016 CLINICAL DATA:  Sepsis.  History of head and neck cancer. EXAM: PORTABLE CHEST 1 VIEW COMPARISON:  Chest radiograph April 01, 2016 FINDINGS: Cardiac silhouette is mildly enlarged unchanged. Heavily calcified aortic. Diffuse interstitial prominence. Worsening retrocardiac consolidation and small LEFT pleural effusion. RIGHT lung base strandy densities. No pneumothorax. Surgical clips LEFT neck, stent projects in RIGHT neck and LEFT subclavian vessels. Postsurgical changes RIGHT distal clavicle. Bibasilar strandy densities IMPRESSION: Stable cardiomegaly. Worsening retrocardiac consolidation with small LEFT pleural effusion. RIGHT lung base atelectasis/ scarring. Worsening bronchitic changes. Electronically Signed   By: Elon Alas M.D.   On: 03/24/2016 19:40   Ct Angio Abdomen Pelvis  W &/or Wo Contrast  Result Date: 04/03/2016 CLINICAL DATA:  70 year old male with abdominal pain. Negative lactic acidosis. Renal failure with peritoneal dialysis catheter. EXAM: CT ANGIOGRAPHY  ABDOMEN AND PELVIS TECHNIQUE: Multidetector CT imaging through the abdomen and pelvis was performed using the standard protocol during bolus administration of intravenous contrast. Delayed images included. Multiplanar reconstructed images including MIPs were obtained and reviewed to evaluate the vascular anatomy. CONTRAST:  100 cc Isovue 370 COMPARISON:  CT angiogram done the previous day, CT 03/28/2016, CT 11/26/2014 FINDINGS:  VASCULAR Aorta: Extensive mixed calcified and soft plaque of the lower thoracic and abdominal aorta. No aneurysm. No dissection. No periaortic fluid. Celiac: Similar appearance of atherosclerotic changes at the origin of the celiac artery, which is patent though perhaps with 50% or greater stenosis. Post stenotic dilation. Typical branch pattern again noted, discussed on the prior CT. SMA: Similar stenotic appearance of the superior mesenteric artery with mixed calcified and soft plaque. Greater than 50% stenosis. Distal branches appear patent. Renals: Calcified and soft plaque at the origin the bilateral renal arteries. Single bilateral renal arteries. IMA: Patent IMA. Likely stenosis at the origin. Difficult to quantify degree of stenosis. Right lower extremity: Mixed calcified and soft plaque of  the right iliac system. Again, likely greater than 50% stenosis in the proximal right common iliac artery. Hypogastric artery and external iliac artery patent. Atherosclerotic disease throughout the external iliac artery. Common femoral artery in the proximal full more all vessels patent. Left lower extremity: Mixed calcified and soft plaque of the left iliac system. Likely stenosis at the distal common iliac artery. Hypogastric artery and external iliac artery patent. Disease through the length of the external iliac artery. Common femoral artery in the proximal fill more 0 vasculature patent on today's exam. Veins: Portal venous system remains patent. Unremarkable appearance of the systemic venous vasculature. Review of the MIP images confirms the above findings. NON-VASCULAR Lower chest: Respiratory motion somewhat limits evaluation of the lung bases. Coverage of the lower chest/ field-of-view larger on the current study. Plaque like thickening of the left sided pleura at the lung base. This has been present since the CT 11/26/2014 Coronary calcifications.  Aortic valve calcifications. Hepatobiliary: Morphology of the  liver compatible with cirrhosis with enlarged left lobe, caudate lobe, and nodular configuration. No focal lesion identified. Pancreas: Unremarkable appearance of pancreas Spleen: Borderline enlarged spleen Adrenals/Urinary Tract: Unremarkable appearance of adrenal glands. Right: Severe renal cortical thinning. No hydronephrosis or nephrolithiasis. Hypodense lesion on the lateral cortex and at the superior cortex too small to characterize. Left: Severe renal cortical thinning. No hydronephrosis. Similar appearance of low-density cystic structures, the larger of which are compatible with simple cysts. Smaller are too small to characterize. Excreted contrast within the urinary bladder. Stomach/Bowel: Unremarkable stomach. Small hiatal hernia. No evidence of small bowel obstruction or transition point. Minimal thickening of small bowel wall, persists after the prior CT. Small bowel wall enhances on the arterial and venous phase, with no CT evidence of ischemia. Retained oral contrast within colon, limiting evaluation of the mucosal surface. No evidence of obstruction. Small stool burden. Diverticular disease. Normal appendix. Lymphatic: Low-density ascites within the pericolic gutter, right upper quadrant, and within the dependent pelvis associated with the peritoneal dialysis catheter. No enhancement of the surface is the peritoneum and. Vascular/lymphatic congestion within small bowel mesenteric, similar to prior. Small lymph nodes, none of which are enlarged. Mesenteric: Fluid throughout the peritoneal of small volume. Small amount of anti dependent gas, similar to prior. Peritoneal dialysis catheter terminates within the low pelvis, adjacent to the rectum. Reproductive: Unremarkable appearance of the pelvic organs. Other: Small fat containing umbilical hernia. Musculoskeletal: Sclerotic appearance of the skeletal elements. No acute bony abnormality. IMPRESSION: Vascular: No acute vascular abnormality. Similar to  yesterday's study there is diffuse atherosclerotic changes of the abdominal aorta, with involvement of the mesenteric vasculature. Again, there is likely greater than 50% stenosis at the celiac artery and IMA artery, however, CTA resolution difficult to make precise assessment. The degree of stenosis at the SMA origin is likely high-grade given the post stenotic dilation and degree of disease. Distal branches of the mesenteric vessels remain patent. Bilateral renal artery stenosis. Portal venous system unremarkable. Systemic venous system unremarkable. Nonvascular: Morphology of the liver compatible with cirrhosis. Associated borderline splenomegaly, indicating developing portal hypertension. No evidence of bowel ischemia, with uniform enhancement of stomach and small bowel. Although the colon wall cannot be assessed with the presence of positive enteric contrast, there are no focal findings to suggest colonic ischemia. Temporal resolution of mild small bowel thickening is improved with 24 hour follow-up CT, with mild thickening of the small bowel in the mid abdomen. There is associated lymphatic congestion of the small bowel mesenteric with  associated small lymph nodes. Finding is nonspecific. There is no evidence of venous obstruction per se, although these findings can be seen with portal colopathy/enteropathy in the setting of cirrhosis and developing portal hypertension. Alternative consideration would be an nonspecific inflammatory or infectious enteritis. These results were called by telephone at the time of interpretation on 04/03/2016 at 8:56 am to Dr. Carles Collet, who verbally acknowledged these results. Small volume of low-density ascites, likely related to PD catheter. PD catheter within the dependent pelvis adjacent to rectum. No evidence of peritonitis on the contrast-enhanced exam. Persistent thickening of the left posterior lower pleura, unchanged from the CT 11/26/2014. This may reflect rounded  atelectasis/scarring. Further evaluation with chest CT may be considered if the patient has a history of carcinoma. Coronary artery disease.  Aortic valve calcifications. Signed, Dulcy Fanny. Earleen Newport, DO [facility] Radiology Electronically Signed   By: Corrie Mckusick D.O.   On: 04/03/2016 08:59   Ct Angio Abd/pel W And/or Wo Contrast  Result Date: 04/01/2016 CLINICAL DATA:  70 year old male with a history of lower abdominal pain and nausea vomiting. Constipation. EXAM: CTA ABDOMEN AND PELVIS WITH CONTRAST TECHNIQUE: Multidetector CT imaging of the abdomen and pelvis was performed using the standard protocol during bolus administration of intravenous contrast. Multiplanar reconstructed images and MIPs were obtained and reviewed to evaluate the vascular anatomy. CONTRAST:  75 cc Isovue 370 COMPARISON:  CT 03/28/2016, 11/26/2014, 08/06/2013 FINDINGS: VASCULAR Aorta: Diffuse atherosclerosis of the abdominal aorta with soft plaque and calcified plaque. No dissection. No aneurysm. No periaortic fluid. Celiac: Atherosclerotic changes at the origin of the celiac artery which is patent. Post stenotic dilation. Typical branch pattern of the celiac artery, with left gastric, common hepatic, and splenic artery identified. SMA: Atherosclerotic changes of the superior mesenteric artery, with mixed calcified and soft plaque at the origin. The SMA appears patent, with greater than 50% stenosis. Renals: Dense calcified and soft plaque at the origin the bilateral renal arteries. Renal arteries are patent. Single left and single right renal artery. IMA: Inferior mesenteric artery is patent, though appears stenotic at the origin. Left colic artery patent. Superior rectal artery patent. Right lower extremity: Dense calcified and soft plaque involving the right iliac system, including common iliac artery and external iliac artery. Small caliber vessels, with likely multifocal stenoses.  Greatest degree of stenosis may be at the distal common iliac artery above the hypogastric origin. Hypogastric artery is patent. Common femoral artery patent. Proximal SFA and profunda femoris patent. Left lower extremity: Mixed calcified and soft plaque of the left iliac system. Greatest degree of stenosis at the origin of the external iliac artery. Hypogastric artery patent. Common femoral artery patent. Proximal SFA and profunda femoris patent. Veins: Unremarkable appearance of the venous system. Review of the MIP images confirms the above findings. NON-VASCULAR Lower chest: Atelectatic changes at the lung bases. Small hiatal hernia. Hepatobiliary: Morphologic changes of the liver compatible with cirrhosis with developing left hepatic enlargement. Nodular contour. No focal lesion identified on the current CT. No intrahepatic or extrahepatic biliary ductal dilatation. Cholecystectomy Pancreas: Unremarkable appearance of the pancreas. No pericholecystic fluid or inflammatory changes. Unremarkable ductal system. Spleen: Borderline splenomegaly. Adrenals/Urinary Tract: Unremarkable appearance of adrenal glands. Right: No hydronephrosis. Advanced cortical renal thinning. Symmetric perfusion to the left. Hypodense lesions within the cortex of the right kidney, the majority are too small to characterize though arm most likely benign cysts. Left: No hydronephrosis. Advanced cortical renal thinning come symmetric to the right. Symmetric perfusion.  Cystic lesions in the cortex of the left kidney, most likely all benign cysts although the majority are too small to characterize. Unremarkable appearance of the urinary bladder with high density excreted contrast. Stomach/Bowel: Unremarkable appearance of the stomach. Small hiatal hernia. Unremarkable appearance of small bowel. Venous phase demonstrates enhancement of the small bowel and colon, although the colon somewhat limited by the presence of enteric contrast. Colonic  diverticula. Lymphatic: Multiple lymph nodes in the para-aortic nodal station, none of which are enlarged. Mesenteric: Low-density free fluid within the abdomen, associated with peritoneal dialysis catheter. Small volume of free air in the anti dependent aspect. Reproductive: Unremarkable appearance of the prostate and pelvic organs. Other: Small fat containing umbilical hernia. Unremarkable appearance of the tract of the dialysis catheter. Musculoskeletal: No evidence of acute fracture. Developing sclerotic appearance of the skeletal structures, potentially early renal osteodystrophy. No bony canal narrowing. No significant degenerative changes of the hips. IMPRESSION: VASCULAR No acute vascular abnormality identified. Advanced aortic atherosclerosis. There are associated changes at the origin of all mesenteric vessel, though they remain patent. There may be 50 percent stenosis at the celiac artery origin, with greater than 50 percent stenosis of the superior mesenteric artery origin, and potentially greater than 50 percent stenosis at the origin of the IMA, although degree of stenosis cannot be determined by CTA. This pattern of disease certainly places the patient at risk for chronic mesenteric ischemia. Correlation with patient's history may be useful, and potentially mesenteric duplex exam if there is concern for compromise. Bilateral renal artery stenosis, potentially contributing to advanced renal cortical thinning. NON-VASCULAR Morphologic changes of the liver compatible with cirrhosis. Referral for establishing gastroenterology care may be considered if not already established, as well as consideration of initiating surveillance for hepatocellular carcinoma. Peritoneal dialysis catheter with free fluid likely associated with treatment. Tiny amount of anti dependent gas within the abdomen, again likely associated with the peritoneal dialysis. Again, bilateral renal cortical cysts. While these are most likely  benign, there is incomplete characterization given the size. Developing renal osteodystrophy. Signed, Dulcy Fanny. Earleen Newport, DO [facility] Radiology Electronically Signed   By: Corrie Mckusick D.O.   On: 04/01/2016 09:00     CBC  Recent Labs Lab 04/11/16 0343 04/13/16 0554 04/14/16 0800 04/16/16 1253 04/17/16 0530  WBC 12.5* 10.2 10.5 13.3* 13.5*  HGB 8.4* 9.4* 10.1* 9.8* 10.5*  HCT 27.9* 31.2* 34.3* 34.7* 36.8*  PLT 219 259 289 234 235  MCV 104.9* 106.1* 106.9* 111.2* 109.5*  MCH 31.6 32.0 31.5 31.4 31.3  MCHC 30.1 30.1 29.4* 28.2* 28.5*  RDW 17.4* 17.4* 17.8* 18.5* 18.1*    Chemistries   Recent Labs Lab 04/11/16 0343 04/13/16 0554 04/14/16 0403 04/14/16 1209 04/16/16 1253 04/17/16 0530  NA 135 138 138  --  143 139  K 3.8 4.4 3.9  --  4.0 4.6  CL 104 102 100*  --  104 98*  CO2 18* 18* 21*  --  23 25  GLUCOSE 90 135* 112*  --  66 73  BUN 42* 40* 22*  --  36* 20  CREATININE 10.24* 8.69* 5.30*  --  6.86* 4.46*  CALCIUM 7.9* 8.5* 8.3*  --  8.7* 8.9  MG  --  2.0  --   --  2.5*  --   AST 52* 41  --  55* 47* 56*  ALT 30 32  --  35 33 35  ALKPHOS 92 79  --  92 84 90  BILITOT 0.9 1.2  --  0.8 1.0 1.3*   ------------------------------------------------------------------------------------------------------------------ estimated creatinine clearance is 16.2 mL/min (by C-G formula based on SCr of 4.46 mg/dL (H)). ------------------------------------------------------------------------------------------------------------------ No results for input(s): HGBA1C in the last 72 hours. ------------------------------------------------------------------------------------------------------------------ No results for input(s): CHOL, HDL, LDLCALC, TRIG, CHOLHDL, LDLDIRECT in the last 72 hours. ------------------------------------------------------------------------------------------------------------------ No results for input(s): TSH, T4TOTAL,  T3FREE, THYROIDAB in the last 72 hours.  Invalid input(s): FREET3 ------------------------------------------------------------------------------------------------------------------ No results for input(s): VITAMINB12, FOLATE, FERRITIN, TIBC, IRON, RETICCTPCT in the last 72 hours.  Coagulation profile  Recent Labs Lab 04/15/16 0340  INR 1.19    No results for input(s): DDIMER in the last 72 hours.  Cardiac Enzymes No results for input(s): CKMB, TROPONINI, MYOGLOBIN in the last 168 hours.  Invalid input(s): CK ------------------------------------------------------------------------------------------------------------------ Invalid input(s): POCBNP   CBG:  Recent Labs Lab 04/11/16 0810 04/11/16 1315 04/11/16 1756 04/11/16 2158 04/12/16 0747  GLUCAP 82 88 94 112* 126*       Studies: No results found.    Lab Results  Component Value Date   HGBA1C 5.5 04/01/2016   HGBA1C 6.3 (H) 08/11/2013   Lab Results  Component Value Date   CREATININE 4.46 (H) 04/17/2016       Scheduled Meds: . aspirin EC  81 mg Oral Daily  . darbepoetin (ARANESP) injection - DIALYSIS  60 mcg Intravenous Q Sat-HD  . famotidine  20 mg Oral Daily  . feeding supplement (NEPRO CARB STEADY)  237 mL Oral BID BM  . fluconazole  200 mg Oral QHS  . heparin subcutaneous  5,000 Units Subcutaneous Q8H  . ipratropium  2 spray Each Nare BID  . lidocaine  1 patch Transdermal Q24H  . mouth rinse  15 mL Mouth Rinse BID  .  morphine injection  2 mg Intravenous Once  . multivitamin  1 tablet Oral QHS  . sodium chloride flush  3 mL Intravenous Q12H   Continuous Infusions:     LOS: 8 days    Time spent: >30 MINS    [facility]  [facility] Pager [phone]. If 7PM-7AM, please contact night-coverage at [email or web address], password [facility] 04/17/2016, 12:35 PM  LOS: 8 days

## 2016-04-17 NOTE — Progress Notes (Signed)
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 ESRD - has been clipped to MWF2 at [facility] - on TTS here 3. Anemia - Aranesp 60 q Sat hgb 10.5 4. Secondary hyperparathyroidism - Ca/P ok no binders or VDRA 5. HTN/volume - net UF 1 L Thursday with post weight 74.2 bed scale- off all BP meds - titrating volulme 6. Nutrition - alb 2 - has been NPO - ST has just seen - tells me they will start POs Main issue for wife  Victor Jacobson, PA-C [place] [phone] 04/17/2016,9:19 AM  LOS: 8 days  I have seen and examined this patient and agree with the plan of care seen, eval, examined, counseled patient and wife .  Case Vassell L 04/17/2016, 11:32 AM  Subjective:    More alert.  Still poor intake,  Some abdm spasms yet. Having bms.  Objective Vitals:   04/16/16 1630 04/16/16 1640 04/16/16 2102 04/17/16 0552  BP: 105/80 123/88 (!) 157/58 131/64  Pulse: 95 100 96 97  Resp: (!) 21 16 15 16   Temp:  97.4 F (36.3 C) 97.1 F (36.2 C) 97.4 F (36.3 C)  TempSrc:  Oral Axillary Oral  SpO2:  100% 97% 100%  Weight:  74.2 kg (163 lb 9.3 oz)    Height:       Physical Exam  +++pt out of room - finish later General:eyes open responds approg, OX2 Heart: Reg Gr2/6 M, PMI 13 cm lat to MSL,   Lungs:No R,R or W Abdomen: Incision lower abd, and L mid abdm pos bs, soft,not tender. Liver down 4 cm Extremities: no edema, AVF Dialysis Access: left upper AVF   Additional Objective Labs: Basic Metabolic Panel:  Recent Labs Lab 04/13/16 0554 04/14/16 0403 04/16/16 1253 04/17/16 0530  NA 138 138 143 139  K 4.4 3.9 4.0 4.6  CL 102 100* 104 98*  CO2 18* 21* 23 25  GLUCOSE 135* 112* 66 73  BUN 40* 22* 36* 20  CREATININE 8.69* 5.30* 6.86* 4.46*  CALCIUM 8.5* 8.3* 8.7* 8.9  PHOS 4.7*  --  5.2*  --    Liver Function Tests:  Recent Labs Lab  04/14/16 1209 04/16/16 1253 04/17/16 0530  AST 55* 47* 56*  ALT 35 33 35  ALKPHOS 92 84 90  BILITOT 0.8 1.0 1.3*  PROT 5.8* 5.5* 5.9*  ALBUMIN 1.8* 1.9* 2.0*    Recent Labs Lab 04/14/16 1209  LIPASE 51   CBC:  Recent Labs Lab 04/11/16 0343 04/13/16 0554 04/14/16 0800 04/16/16 1253 04/17/16 0530  WBC 12.5* 10.2 10.5 13.3* 13.5*  HGB 8.4* 9.4* 10.1* 9.8* 10.5*  HCT 27.9* 31.2* 34.3* 34.7* 36.8*  MCV 104.9* 106.1* 106.9* 111.2* 109.5*  PLT 219 259 289 234 235   Blood Culture    Component Value Date/Time   SDES CSF 04/14/2016 1254   SPECREQUEST TUBE 2 04/14/2016 1254   CULT NO GROWTH 2 DAYS 04/14/2016 1254   REPTSTATUS PENDING 04/14/2016 1254     CBG:  Recent Labs Lab 04/11/16 0810 04/11/16 1315 04/11/16 1756 04/11/16 2158 04/12/16 0747  GLUCAP 82 88 94 112* 126*    Lab Results  Component Value Date   INR 1.19 04/15/2016   INR 1.34 04/14/2016   INR 1.23 04/02/2016   Studies/Results: No results found. Medications:   . aspirin EC  81 mg Oral Daily  . darbepoetin (ARANESP) injection -  DIALYSIS  60 mcg Intravenous Q Sat-HD  . famotidine  20 mg Oral Daily  . feeding supplement (NEPRO CARB STEADY)  237 mL Oral BID BM  . fluconazole  200 mg Oral QHS  . heparin subcutaneous  5,000 Units Subcutaneous Q8H  . ipratropium  2 spray Each Nare BID  . lidocaine  1 patch Transdermal Q24H  . mouth rinse  15 mL Mouth Rinse BID  .  morphine injection  2 mg Intravenous Once  . multivitamin  1 tablet Oral QHS  . sodium chloride flush  3 mL Intravenous Q12H

## 2016-04-17 NOTE — Progress Notes (Addendum)
Speech Language Pathology Treatment: Dysphagia  Patient Details Name: Victor Lane MRN: [id number] DOB: 1945/09/23 Today's Date: 04/17/2016 Time: [id number] SLP Time Calculation (min) (ACUTE ONLY): 26 min  Assessment / Plan / Recommendation Clinical Impression  Pt demonstrates improved arousal today, able to focus and briefly sustain attention to PO intake with max cues and redirection. Pt continues to demonstrate signs concerning for aspiration with thin liquids, delayed coughing and throat clearing in setting of reduced hyolaryngeal mobility after radiation to the throat. Suspect reduced sensation of aspiration. Trials of nectar thick liquids are better tolerated with no coughing or throat clearing after multiple trials. Pt likely would not participate in objective test, though this would be beneficial to determine severity of dysphagia and best diet. For now, will rely on subjective findings and initiate a modified diet to reduce risk but also allow PO intake. Instructed wife in thickening and feeding. Will initiate a puree diet and nectar thick liquids.    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 He presents to ED sent by renal MD for PD cath removal for refractory culture negative peritonitis.Per chart last ECHO shows EF 30%. Hypotensive on arrival. Possible small left lower lobe infiltrate. Found to have recurrent peritonitis, sepsis. Peritoneal catheter removed under surgery on 10/20. Pt has reported dysphagia to family and providers.       SLP Plan  Continue with current plan of care     Recommendations  Diet recommendations: Dysphagia 1 (puree);Nectar-thick liquid Liquids provided via: Cup;Straw Medication Administration: Crushed with puree Supervision: Full supervision/cueing for compensatory strategies;Trained caregiver to feed  patient Compensations: Minimize environmental distractions Postural Changes and/or Swallow Maneuvers: Seated upright 90 degrees;Upright 30-60 min after meal                General recommendations: Rehab consult Follow up Recommendations: Inpatient Rehab Plan: Continue with current plan of care       GO               [facility], MA CCC-SLP [id number]  Lynann Beaver 04/17/2016, 9:34 AM

## 2016-04-17 NOTE — Evaluation (Addendum)
Physical Therapy Evaluation Patient Details Name: Victor Lane MRN: [id number] DOB: Feb 17, 1946 Today's Date: 04/17/2016   History of Present Illness  70 year old male with a history of ESRD on peritoneal dialysis, CAD, diabetes mellitus type 2, carotid stenosis, aortic stenosis, GERD, laryngeal cancer admitted with acute encephalopathy,  peritonitis, s/p removal of PD cath by general surgery 04/09/2016  .   Clinical Impression  Pt admitted with above diagnosis. Pt currently with functional limitations due to the deficits listed below (see PT Problem List). Pt quite confused which limited assessment of mobility. Mod assist for supine to sit, pt then refused to attempt to stand. At present functional level, SNF recommended.  Pt will benefit from skilled PT to increase their independence and safety with mobility to allow discharge to the venue listed below.       Follow Up Recommendations SNF    Equipment Recommendations  None recommended by PT    Recommendations for Other Services       Precautions / Restrictions Precautions Precautions: Fall Precaution Comments: pt reports he's had falls at home, cannot provide details due to confusion, no family present to clarify Restrictions Weight Bearing Restrictions: No      Mobility  Bed Mobility   Bed Mobility: Sidelying to Sit;Rolling Rolling: Min assist Sidelying to sit: Mod assist       General bed mobility comments: MOD A to raise trunk, +2 total to scoot up in bed, confusion may be masking pt's true abilities  Transfers                 General transfer comment: pt refused to attempt to stand, stated he was too tired and he returned to lying  Ambulation/Gait                Stairs            Wheelchair Mobility    Modified Rankin (Stroke Patients Only)       Balance Overall balance assessment: History of Falls;Needs assistance Sitting-balance support: Feet supported Sitting balance-Leahy Scale:  Fair                                       Pertinent Vitals/Pain Pain Assessment: No/denies pain    Home Living Family/patient expects to be discharged to:: Private residence Living Arrangements: Spouse/significant other Available Help at Discharge: Family Type of Home: House Home Access: Stairs to enter Entrance Stairs-Rails: Right Entrance Stairs-Number of Steps: 5 Home Layout: One level Home Equipment: Environmental consultant - 2 wheels;Cane - single point;Toilet riser;Grab bars - tub/shower Additional Comments: information obtained from chart, due to confusion pt not able to provide PLOF    Prior Function Level of Independence: Needs assistance   Gait / Transfers Assistance Needed: requires intermittent assist from wife for mobility           Hand Dominance   Dominant Hand: Right    Extremity/Trunk Assessment   Upper Extremity Assessment: Difficult to assess due to impaired cognition           Lower Extremity Assessment: Generalized weakness;Difficult to assess due to impaired cognition (able to actively extend L knee to -30*, could not follow commands to perform MMT, would not actively extend R knee)      Cervical / Trunk Assessment: Kyphotic  Communication   Communication: No difficulties  Cognition Arousal/Alertness: Awake/alert Behavior During Therapy: Flat affect Overall Cognitive Status: Impaired/Different from baseline  Area of Impairment: Awareness;Safety/judgement;Problem solving;Memory;Following commands;Orientation Orientation Level: Place;Time;Situation (pt could not state his birthdate, stated he's at his son-in-law's place)   Memory: Decreased short-term memory Following Commands: Follows one step commands inconsistently Safety/Judgement: Decreased awareness of safety;Decreased awareness of deficits   Problem Solving: Slow processing;Difficulty sequencing;Requires verbal cues;Requires tactile cues      General Comments      Exercises      Assessment/Plan    PT Assessment Patient needs continued PT services  PT Problem List Decreased strength;Decreased activity tolerance;Decreased balance;Decreased mobility;Decreased cognition;Cardiopulmonary status limiting activity;Decreased safety awareness          PT Treatment Interventions DME instruction;Gait training;Functional mobility training;Therapeutic activities;Therapeutic exercise;Balance training;Cognitive remediation;Patient/family education    PT Goals (Current goals can be found in the Care Plan section)  Acute Rehab PT Goals Patient Stated Goal: none stated PT Goal Formulation: Patient unable to participate in goal setting Time For Goal Achievement: 05/01/16 Potential to Achieve Goals: Fair    Frequency Min 3X/week   Barriers to discharge        Co-evaluation               End of Session   Activity Tolerance: Patient limited by fatigue Patient left: with call bell/phone within reach;in bed;with nursing/sitter in room;with bed alarm set (chair alarm pad in position, no alarm box available nsg awar) Nurse Communication: Mobility status (no alarm box)         Time: [id number] PT Time Calculation (min) (ACUTE ONLY): 16 min   Charges:   PT Evaluation $PT Eval Moderate Complexity: 1 Procedure     PT G CodesPhilomena Doheny 04/17/2016, 1:42 PM [phone]

## 2016-04-17 NOTE — Care Management Important Message (Signed)
Important Message  Patient Details  Name: Victor Lane MRN: [id number] Date of Birth: Aug 06, 1945   Medicare Important Message Given:  Yes    [name] 04/17/2016, 3:41 PM

## 2016-04-18 DIAGNOSIS — G9340 Encephalopathy, unspecified: Secondary | ICD-10-CM

## 2016-04-18 DIAGNOSIS — D638 Anemia in other chronic diseases classified elsewhere: Secondary | ICD-10-CM

## 2016-04-18 LAB — CSF CULTURE: CULTURE: NO GROWTH

## 2016-04-18 LAB — CSF CULTURE W GRAM STAIN

## 2016-04-18 MED ORDER — DARBEPOETIN ALFA 60 MCG/0.3ML IJ SOSY
PREFILLED_SYRINGE | INTRAMUSCULAR | Status: AC
  Filled 2016-04-18: qty 0.3

## 2016-04-18 MED ORDER — SODIUM CHLORIDE 0.9 % IV SOLN: 100.0000 mL | INTRAVENOUS | Status: DC | PRN

## 2016-04-18 MED ORDER — LIDOCAINE HCL (PF) 1 % IJ SOLN: 5.0000 mL | INTRAMUSCULAR | Status: DC | PRN

## 2016-04-18 MED ORDER — LIDOCAINE-PRILOCAINE 2.5-2.5 % EX CREA
1.0000 "application " | TOPICAL_CREAM | CUTANEOUS | Status: DC | PRN
  Filled 2016-04-18: qty 5

## 2016-04-18 MED ORDER — HYDROMORPHONE HCL 2 MG/ML IJ SOLN
0.5000 mg | Freq: Once | INTRAMUSCULAR | Status: AC
  Administered 2016-04-18: 0.5 mg via INTRAVENOUS
  Filled 2016-04-18: qty 1

## 2016-04-18 MED ORDER — ALTEPLASE 2 MG IJ SOLR: 2.0000 mg | Freq: Once | INTRAMUSCULAR | Status: DC | PRN

## 2016-04-18 MED ORDER — BOOST / RESOURCE BREEZE PO LIQD
1.0000 | Freq: Three times a day (TID) | ORAL | Status: DC
  Administered 2016-04-19: 10:00:00 via ORAL

## 2016-04-18 MED ORDER — PENTAFLUOROPROP-TETRAFLUOROETH EX AERO: 1.0000 "application " | INHALATION_SPRAY | CUTANEOUS | Status: DC | PRN

## 2016-04-18 MED ORDER — HEPARIN SODIUM (PORCINE) 1000 UNIT/ML DIALYSIS: 1000.0000 [IU] | INTRAMUSCULAR | Status: DC | PRN

## 2016-04-18 MED ORDER — HEPARIN SODIUM (PORCINE) 1000 UNIT/ML DIALYSIS: 20.0000 [IU]/kg | INTRAMUSCULAR | Status: DC | PRN

## 2016-04-18 NOTE — Progress Notes (Signed)
[facility] PROGRESS NOTE  Victor Lane [id number] DOB: 11-08-1945 DOA: 03/26/2016   PCP: Beatris Si     Assessment/Plan: Principal Problem:   Peritonitis ([place]) Active Problems:   HTN (hypertension)   Anemia of chronic disease   Continuous ambulatory peritoneal dialysis status ([place])   ESRD on dialysis ([place])   Dilated aortic root (HCC)   CAD (coronary artery disease), native coronary artery   Dyslipidemia   Chronic systolic congestive heart failure (HCC)   Carotid stenosis   Abdominal pain   Diabetes mellitus with complication (HCC)   Hypokalemia   Acute encephalopathy   Acute abdomen   Back pain   Palliative care encounter     70 y.o. 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  He presents to ED sent by renal MD for PD cath removal for refractory culture negative peritonitis. Per chart last ECHO shows EF 30%. Hypotensive on arrival. Possible small left lower lobe infiltrate. Continues to complain of abdominal pain.Also noted to have dysphagia due to hx of radiation to upper airway(vocal cord cancer ).MBS ,now on modified diet . Hospitalization prolonged due to encephalopathy   Assessment and plan Peritonitis ([place])- Fungal/left lower lobe pneumonia-Treated / sepsis- resolved:  his ascites fluid cultures from 10/11 was negative, but has high cell count in fluid, indicating persistent or recurrent peritonitis. Pt was suspected to have sepsis on presentation with elevated lactate, tachycardia, tachypnea and hypotension. Blood pressure responded to IV fluid resuscitation,   Nephrology following Status pos removal of PD cath by general surgery 10/20   Hemodynamics have improved with confusion has not Completed Vancomycin & Zosyn on 10/21 and 10/22 respectively. Currently on  oral Diflucan [outpatient culture suspicious for  Yeast , identification pending] Duration of treatment-may need infectious disease consult Blood culture from 10/11 & 10/19 negative, culture from peritoneal washings 10/11, 10/8  Negative    Nonsustained VT Patient has a very low EF, 30-35%, repeat 2-D echo shows EF is 20-25% Patient is already on Diflucan, should not add any other medication to prolong QTC Haldol discontinued Consider repeating 2-D echo in a month or 2.? Consideration for ICD if persistent low EF.  Abdominal pain Severe abdominal pain with HD 10/25 ,   CT 10/24 Spleen shows  9 mm somewhat wedge-shaped peripheral opacity,question small infarct. Patent splenic and portal veins, nl LFT's , lipase  -Etiology unclear, but suspect this is multifactorial including recent infectious gastroenteritis, constipation, peritonitis, possible mesenteric ischemia Evaluated by  vascular surgery 10/11 --low suspicion of mesenteric ischemia causing abdominal pain -04/01/2016 CT angiogram abdomen--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, celiac origin, IMA -03/28/2016 and 04/01/2016 CTs of the abdomen negative for abscess -04/02/16 CT angio abd--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, celiac origin, IMA unchanged--?SB thickening related to PD fluid/cirrhosis   seen by GI ([facility]) 10/13- did not feel there was any intraabd castastrophe--continue medical therapy  seen by general surgery consult--no indication for exploratory laparoscopy   -04/04/16--pt and wife wanted to leave AMA--discussed with Dr. Jobie Quaker ceftazidime 2g and follow up 04/06/16 with home teaching at dialysis center  given findings of a small infarct in the spleen , repeat 2-D echo  EF: 20% -   25%,Akinesis of the  apicalanteroseptal and apical myocardium , probably will  need TEE to r/o embolic source , to risky for him to undergo sedation    Acute metabolic encephalopathy:  continues to have intermittent confusion ? Etiology Continue to  treat underlying infection MRI brain 10/23: No acute finding, including infarct. Generalized atrophy and mild chronic microvascular ischemic change. Ammonia level within normal limits LP negative so far ,  CSF fluid cx 10/24: Negative   ESRD- was on PD: Now hemodialysis per Dr. Jonnie Finner,   Transitioned PD to HD,AVF functional L arm. HD as per nephrology recommendations  clipped to [facility]   Hypokalemia:  , resolved Repleted   CAD: s/p of stent. No CP -continue aspirin per rectal, IV metoprolol  HTN: -hold oral meds -IV hydralazine when necessary and IV metoprolol with holding parameters.  Chronic systolic congestive heart failure:  . Patient does not have leg edema or JVD. CHF seems to be compensated on admission. Pt is not on diuretics at home. Volume status being managed with hemodialysis Repeat echo results as above    DM-II: Last A1c 5.5, well controled. Patient is taking glipizide at home CBGs have remained stable, DC Accu-Cheks   Back pain-patient's wife states that patient had a recent fall Plain films show spondylosis of the lumbar spine, no fracture   Dysphagia-patient deemed to be high risk for aspiration,now on Dysphagia 1 (puree);Nectar-thick liquid patient has had difficulty swallowing medications,MBS swallow pending, patient is confused to participate. Previous history of radiation therapy to vocal cords     DVT prophylaxsis heparin  Code Status:  Full code    Family Communication: No family at bedside. Disposition   remains inpatient, palliative care consult for goals of care given multiple comorbidities and no signs of improvement, they want full scope of treatment      Consultants:  General surgery  Nephrology  Procedures:  HD  Antibiotics: Anti-infectives    Start     Dose/Rate Route Frequency Ordered Stop   04/16/16 2200  fluconazole (DIFLUCAN) tablet 200 mg     200 mg Oral Daily at bedtime 04/16/16 1439     04/14/16 1200  vancomycin  (VANCOCIN) IVPB 1000 mg/200 mL premix  Status:  Discontinued     1,000 mg 200 mL/hr over 60 Minutes Intravenous Every T-Th-Sa (Hemodialysis) 04/11/16 1447 04/12/16 1259   04/11/16 1800  vancomycin (VANCOCIN) IVPB 1000 mg/200 mL premix     1,000 mg 200 mL/hr over 60 Minutes Intravenous  Once 04/11/16 1447 04/11/16 2022   04/11/16 1330  piperacillin-tazobactam (ZOSYN) IVPB 3.375 g  Status:  Discontinued     3.375 g 12.5 mL/hr over 240 Minutes Intravenous Every 12 hours 04/11/16 1315 04/12/16 1259   04/11/16 1000  piperacillin-tazobactam (ZOSYN) IVPB 3.375 g  Status:  Discontinued     3.375 g 12.5 mL/hr over 240 Minutes Intravenous Every 12 hours 04/11/16 0353 04/11/16 1315   04/11/2016 2300  fluconazole (DIFLUCAN) IVPB 200 mg  Status:  Discontinued     200 mg 100 mL/hr over 60 Minutes Intravenous Every 24 hours 03/26/2016 2232 04/16/16 1439   03/27/2016 2200  piperacillin-tazobactam (ZOSYN) IVPB 2.25 g  Status:  Discontinued     2.25 g 100 mL/hr over 30 Minutes Intravenous Every 6 hours 03/31/2016 1622 04/11/16 0353   04/12/2016 1615  piperacillin-tazobactam (ZOSYN) IVPB 3.375 g     3.375 g 100 mL/hr over 30 Minutes Intravenous  Once 03/22/2016 1609 04/03/2016 1756   04/16/2016 1615  vancomycin (VANCOCIN) 1,500 mg in sodium chloride 0.9 % 500 mL IVPB  1,500 mg 250 mL/hr over 120 Minutes Intravenous  Once 03/27/2016 1609 03/29/2016 2140          HPI/Subjective: Seen at hemodialysis this morning. Appeared pleasantly confused without any specific complaints. As per RN, apparently had rectal tube placed yesterday for diarrhea although no significant stool in the tube. Stool C. difficile testing was negative.  Objective: Vitals:   04/18/16 1000 04/18/16 1005 04/18/16 1030 04/18/16 1155  BP: (!) 86/67 (!) 92/56 (!) 90/50 120/70  Pulse: (!) 109 (!) 106 (!) 104 (!) 104  Resp: (!) 24 17 18 18   Temp:    98 F (36.7 C)  TempSrc:    Oral  SpO2:    100%  Weight:    74 kg (163 lb 2.3 oz)  Height:         Intake/Output Summary (Last 24 hours) at 04/18/16 1429 Last data filed at 04/18/16 1155  Gross per 24 hour  Intake               60 ml  Output                0 ml  Net               60 ml    Exam:  Examination:  General exam: Confused . Lying comfortably supine in bed. Respiratory system: Clear to auscultation. Respiratory effort normal. Cardiovascular system: S1 & S2 heard, RRR. No JVD, murmurs, rubs, gallops or clicks. No pedal edema. Telemetry: Sinus rhythm. Gastrointestinal system: Abdomen is nondistended, soft and nontender. No organomegaly or masses felt. Normal bowel sounds heard. Central nervous system:  Alert and and oriented to self and place only. No focal neurological deficits. Extremities: Symmetric 5 x 5 power. Skin: No rashes, lesions or ulcers Psychiatry: Judgement and insight appear impaired. Mood & affect appropriate.     Data Reviewed: I have personally reviewed following labs and imaging studies  Micro Results Recent Results (from the past 240 hour(s))  C difficile quick scan w PCR reflex     Status: None   Collection Time: 04/18/2016  4:07 PM  Result Value Ref Range Status   C Diff antigen NEGATIVE NEGATIVE Final   C Diff toxin NEGATIVE NEGATIVE Final   C Diff interpretation No C. difficile detected.  Final  Gastrointestinal Panel by PCR , Stool     Status: None   Collection Time: 03/27/2016  4:07 PM  Result Value Ref Range Status   Campylobacter species NOT DETECTED NOT DETECTED Final   Plesimonas shigelloides NOT DETECTED NOT DETECTED Final   Salmonella species NOT DETECTED NOT DETECTED Final   Yersinia enterocolitica NOT DETECTED NOT DETECTED Final   Vibrio species NOT DETECTED NOT DETECTED Final   Vibrio cholerae NOT DETECTED NOT DETECTED Final   Enteroaggregative E coli (EAEC) NOT DETECTED NOT DETECTED Final   Enteropathogenic E coli (EPEC) NOT DETECTED NOT DETECTED Final   Enterotoxigenic E coli (ETEC) NOT DETECTED NOT DETECTED Final   Shiga  like toxin producing E coli (STEC) NOT DETECTED NOT DETECTED Final   Shigella/Enteroinvasive E coli (EIEC) NOT DETECTED NOT DETECTED Final   Cryptosporidium NOT DETECTED NOT DETECTED Final   Cyclospora cayetanensis NOT DETECTED NOT DETECTED Final   Entamoeba histolytica NOT DETECTED NOT DETECTED Final   Giardia lamblia NOT DETECTED NOT DETECTED Final   Adenovirus F40/41 NOT DETECTED NOT DETECTED Final   Astrovirus NOT DETECTED NOT DETECTED Final   Norovirus GI/GII NOT DETECTED NOT DETECTED Final   Rotavirus A  NOT DETECTED NOT DETECTED Final   Sapovirus (I, II, IV, and V) NOT DETECTED NOT DETECTED Final  Blood Culture (routine x 2)     Status: None   Collection Time: 03/31/2016  4:17 PM  Result Value Ref Range Status   Specimen Description BLOOD RIGHT HAND  Final   Special Requests BOTTLES DRAWN AEROBIC AND ANAEROBIC 5CC  Final   Culture NO GROWTH 5 DAYS  Final   Report Status 04/14/2016 FINAL  Final  Blood Culture (routine x 2)     Status: None   Collection Time: 03/28/2016  4:23 PM  Result Value Ref Range Status   Specimen Description BLOOD LEFT ANTECUBITAL  Final   Special Requests BOTTLES DRAWN AEROBIC AND ANAEROBIC 5CC  Final   Culture NO GROWTH 5 DAYS  Final   Report Status 04/14/2016 FINAL  Final  MRSA PCR Screening     Status: None   Collection Time: 04/10/16 10:29 AM  Result Value Ref Range Status   MRSA by PCR NEGATIVE NEGATIVE Final    Comment:        The GeneXpert MRSA Assay (FDA approved for NASAL specimens only), is one component of a comprehensive MRSA colonization surveillance program. It is not intended to diagnose MRSA infection nor to guide or monitor treatment for MRSA infections.   CSF culture     Status: None   Collection Time: 04/14/16 12:54 PM  Result Value Ref Range Status   Specimen Description CSF  Final   Special Requests TUBE 2  Final   Gram Stain   Final    WBC PRESENT, PREDOMINANTLY MONONUCLEAR NO ORGANISMS SEEN CYTOSPIN    Culture NO GROWTH  3 DAYS  Final   Report Status 04/18/2016 FINAL  Final  C difficile quick scan w PCR reflex     Status: None   Collection Time: 04/17/16  4:11 AM  Result Value Ref Range Status   C Diff antigen NEGATIVE NEGATIVE Final   C Diff toxin NEGATIVE NEGATIVE Final   C Diff interpretation No C. difficile detected.  Final    Radiology Reports Dg Chest 1 View  Result Date: 04/13/2016 CLINICAL DATA:  Sharp chest pain EXAM: CHEST 1 VIEW COMPARISON:  04/15/2016 FINDINGS: Cardiomegaly and congested vascular appearance. Trace bilateral pleural fluid or thickening. Extensive atherosclerosis of the aorta. Multiple stents of the left axilla. IMPRESSION: Cardiomegaly and pulmonary venous congestion. Electronically Signed   By: Monte Fantasia M.D.   On: 04/13/2016 10:05   Dg Chest 2 View  Result Date: 04/01/2016 CLINICAL DATA:  Suspected sepsis.  Peritoneal dialysis patient. EXAM: CHEST  2 VIEW COMPARISON:  Chest radiograph 07/19/2015.  Abdominal CT 03/28/2016 FINDINGS: Stable cardiomegaly and mediastinal contours. There is thoracic aortic atherosclerosis. Small bilateral pleural effusions and bibasilar airspace disease. No pulmonary edema. No pneumothorax. Vascular stent in the left axilla. Free air under the right hemidiaphragm, likely related to peritoneal dialysis. Resorption of the distal right clavicle. IMPRESSION: Chronic cardiomegaly, stable.  Thoracic aortic atherosclerosis. Small bilateral pleural effusions with bibasilar airspace disease, likely atelectasis. Electronically Signed   By: Jeb Levering M.D.   On: 04/01/2016 05:40   Dg Thoracic Spine 2 View  Result Date: 04/12/2016 CLINICAL DATA:  70 year old male with history of back pain and weight loss. EXAM: THORACIC SPINE 2 VIEWS COMPARISON:  No priors. FINDINGS: There is no evidence of thoracic spine fracture. Alignment is normal. Mild multilevel degenerative disc disease, typical for the patient's age. No other significant bone abnormalities are  identified. Aortic  atherosclerosis. Vascular stent projecting over the left axillary region, incompletely visualized. IMPRESSION: 1. No acute radiographic abnormality of the thoracic spine. 2. Aortic atherosclerosis. Electronically Signed   By: Vinnie Langton M.D.   On: 04/12/2016 15:19   Dg Lumbar Spine 2-3 Views  Result Date: 04/12/2016 CLINICAL DATA:  Back pain lumbar region. EXAM: LUMBAR SPINE - 2-3 VIEW COMPARISON:  04/04/2013 FINDINGS: Vertebral body alignment, heights and disc space heights are normal. There is mild spondylosis throughout the lumbar spine to include facet arthropathy over the lower lumbar spine. No compression fracture or subluxation. Moderate calcified plaque over the abdominal aorta and iliac arteries IMPRESSION: Mild spondylosis of the lumbar spine to include facet arthropathy. Electronically Signed   By: Marin Olp M.D.   On: 04/12/2016 15:25   Dg Pelvis 1-2 Views  Result Date: 04/12/2016 CLINICAL DATA:  Low back pain. Unable to obtain history from patient. EXAM: PELVIS - 1-2 VIEW COMPARISON:  None. FINDINGS: Mild symmetric degenerative change of the hips. Mild diffuse decreased bone mineralization is present. There is no acute fracture or dislocation. Skin staples are present over the soft tissues of the left hip region. Several pelvic phleboliths. Calcified plaque over the iliac and femoral arteries. IMPRESSION: No acute findings. Electronically Signed   By: Marin Olp M.D.   On: 04/12/2016 15:26   Mr Brain Wo Contrast  Result Date: 04/13/2016 CLINICAL DATA:  Altered mental status. EXAM: MRI HEAD WITHOUT CONTRAST TECHNIQUE: Multiplanar, multiecho pulse sequences of the brain and surrounding structures were obtained without intravenous contrast. COMPARISON:  None FINDINGS: Brain: Motion degraded exam. Only sagittal T1 weighted imaging, axial diffusion, axial T2, and axial FLAIR sequences could be obtained before patient refused to continue. No acute infarct,  hemorrhage, hydrocephalus, or swelling. Small-vessel ischemic change in the deep cerebral white matter, mild for age. Remote lacunar infarct in the left caudate. Mild generalized cerebral volume loss. Vascular: Normal flow voids. Skull and upper cervical spine: Normal marrow signal. Sinuses/Orbits: Chronic sinusitis including complete opacification of the right maxillary antrum with inspissated secretions. Mild mucosal thickening in the mastoids. IMPRESSION: 1. No acute finding, including infarct. 2. Generalized atrophy and mild chronic microvascular ischemic change. 3. Partial, motion degraded exam. Electronically Signed   By: Monte Fantasia M.D.   On: 04/13/2016 10:41   Dg Chest Left Decubitus  Result Date: 04/13/2016 CLINICAL DATA:  Sharp chest pain. EXAM: CHEST - LEFT DECUBITUS COMPARISON:  Chest x-ray from earlier today FINDINGS: Trace bilateral pleural fluid or thickening. No significant layering fluid on left side down decubitus. No right-sided pneumothorax is noted. Cardiomegaly and vascular congestion. IMPRESSION: Left side down decubitus shows no significant layering pleural fluid or right-sided pneumothorax. Electronically Signed   By: Monte Fantasia M.D.   On: 04/13/2016 10:04   Ct Abdomen Pelvis W Contrast  Result Date: 04/14/2016 CLINICAL DATA:  Patient is poor historian. History of ascites. Sepsis on present a shin. Tachycardia, tachypnea and hypotension. EXAM: CT ABDOMEN AND PELVIS WITH CONTRAST TECHNIQUE: Multidetector CT imaging of the abdomen and pelvis was performed using the standard protocol following bolus administration of intravenous contrast. CONTRAST:  151mL ISOVUE-300 IOPAMIDOL (ISOVUE-300) INJECTION 61% COMPARISON:  04/02/2016, 04/01/2016, 11/26/2014, 08/06/2013 FINDINGS: Lower chest: Stable appearance of mild right middle lobe subpleural consolidation and fibrosis. Moderate emphysematous disease involving the inferior aspects of the upper lobes and the bilateral lower lobes.  Persistent oblong areas of masslike consolidation along the left lower lobe posterior pleural surface and the pleural surface of the lingula laterally. These are  similar in appearance compared with 04/02/2016, slightly decreased from 11/26/2014. Dense atherosclerotic vascular disease of the imaged portions of the aorta. Mildly ectatic ascending aorta, measuring up to 3.9 cm, unchanged. Marked coronary artery calcification. Heart size is slightly enlarged. No large pericardial effusion. Mitral valvular calcifications are also noted. Small amount of left greater than right gynecomastia. Hepatobiliary: Nodular contour of the liver, consistent with cirrhosis. No gross focal hepatic abnormalities. Surgical clips in the gallbladder fossa consistent with prior cholecystectomy. No biliary dilatation. Pancreas: Unremarkable. No pancreatic ductal dilatation or surrounding inflammatory changes. Marked vascular calcifications. Spleen: Borderline enlargement of the spleen measuring 12 cm, unchanged. Now seen is a tiny slightly wedge-shaped relatively peripheral hypodensity in the posterior spleen measuring 9 mm. Normal splenic vein and portal vein enhancement. Adrenals/Urinary Tract: Mild nodular thickening of the left adrenal gland is unchanged. Kidneys are atrophic. No hydronephrosis. Stable hypodense lesion in the mid cortex of right kidney. Stable dominant cyst in the midpole of left kidney. Additional hypodense lesions in the left kidney as before. Stomach/Bowel: Stomach contains fluid. There is no dilated small bowel to suggest an obstruction. There is mild thickening of small bowel loops in the left hemi abdomen, series 2, image number 56. This does not appear significantly changed. No definite intramural air present within the thickened small bowel loop. There is diverticular disease of the sigmoid colon. Minimal wall thickening. Vascular/Lymphatic: Dense vascular calcifications within the aorta. No aneurysm. No  pathologically enlarged mesenteric or pelvic nodes. Reproductive: Prostate is unremarkable. Other: Small amount of abdominal and pelvic ascites, decreased compared to the previous exam. The peritoneal pelvic dialysis catheter has been removed. No free air. Musculoskeletal: Degenerative changes of the spine. No acute osseous abnormality. IMPRESSION: 1. Moderate emphysematous disease at the bilateral lung bases with persistent ovoid areas of consolidation at the pleural surface of the lingula and left lower lobe posteriorly, findings were present dating back to 11/26/2014, query chronic consolidation/atelectasis with pleural based mass(es) also remaining in the differential. 2. Liver cirrhosis. Borderline enlarged spleen. Spleen now demonstrates a 9 mm somewhat wedge-shaped peripheral opacity, question small infarct. Patent splenic and portal veins. 3. Removal of pelvic peritoneal dialysis catheter. There is decreased abdominal and pelvic ascites. 4. Persistent thickened fluid-filled small bowel loops in the left hemi abdomen, possible inflammatory change. No definite intramural air or portal venous gas is present. 5. Atrophic kidney with bilateral hypodense lesions probable cyst as before. 6. Sigmoid diverticulosis with slight wall thickening of the sigmoid colon. Electronically Signed   By: Donavan Foil M.D.   On: 04/14/2016 18:20   Ct Abdomen Pelvis W Contrast  Result Date: 03/29/2016 CLINICAL DATA:  Acute onset of generalized abdominal pain. Recent peritoneal dialysis. Vomiting and diarrhea. Initial encounter. EXAM: CT ABDOMEN AND PELVIS WITH CONTRAST TECHNIQUE: Multidetector CT imaging of the abdomen and pelvis was performed using the standard protocol following bolus administration of intravenous contrast. CONTRAST:  142mL ISOVUE-300 IOPAMIDOL (ISOVUE-300) INJECTION 61% COMPARISON:  CT of the abdomen and pelvis from 11/26/2014 FINDINGS: Lower chest: Trace right-sided pleural fluid is noted. Bibasilar  atelectasis or scarring is seen. The visualized portions of the mediastinum are unremarkable. Hepatobiliary: A small amount of ascites is seen tracking about the liver. The liver is unremarkable in appearance. The patient is status post cholecystectomy, with clips noted at the gallbladder fossa. The common bile duct remains normal in caliber. Pancreas: The pancreas is within normal limits. Spleen: The spleen is unremarkable in appearance. Adrenals/Urinary Tract: The adrenal glands are grossly unremarkable in appearance.  Severe chronic bilateral renal atrophy is noted, with scattered bilateral renal cysts. There is no evidence of hydronephrosis. No renal or ureteral stones are identified. No significant perinephric stranding is seen. Stomach/Bowel: The stomach is unremarkable in appearance. The small bowel is within normal limits. The appendix is normal in caliber, without evidence of appendicitis. Scattered diverticulosis is noted along the distal descending and sigmoid colon, without definite evidence of diverticulitis. Vascular/Lymphatic: Diffuse calcification is seen along the abdominal aorta and its branches. There is relatively severe luminal narrowing at the proximal right common iliac artery, and moderate luminal narrowing along the left common iliac artery. Relatively severe luminal narrowing is also noted along the proximal left superficial femoral artery. No retroperitoneal or pelvic sidewall lymphadenopathy is seen. Reproductive: The bladder is mildly distended and grossly unremarkable. The prostate remains normal in size. Other: The patient's peritoneal dialysis catheter is noted within the pelvis, with a small to moderate amount of free fluid in the pelvis and along both paracolic gutters. Musculoskeletal: No acute osseous abnormalities are identified. The visualized musculature is unremarkable in appearance. IMPRESSION: 1. No acute abnormality seen to explain the patient's symptoms. 2. Status post  recent peritoneal dialysis. Small to moderate volume ascites noted within the abdomen and pelvis. 3. Diffuse aortic atherosclerosis noted. Relatively severe luminal narrowing at the proximal right common iliac artery, and moderate luminal narrowing along the left common iliac artery. Relatively severe luminal narrowing also noted along the proximal left superficial femoral artery. 4. Severe chronic bilateral renal atrophy, with scattered bilateral renal cysts. 5. Scattered diverticulosis along the distal descending and sigmoid colon, without definite evidence of diverticulitis. 6. Trace right-sided pleural fluid. Bibasilar atelectasis or scarring noted. Electronically Signed   By: Garald Balding M.D.   On: 03/29/2016 00:10   Dg Chest Port 1 View  Result Date: 03/30/2016 CLINICAL DATA:  Sepsis.  History of head and neck cancer. EXAM: PORTABLE CHEST 1 VIEW COMPARISON:  Chest radiograph April 01, 2016 FINDINGS: Cardiac silhouette is mildly enlarged unchanged. Heavily calcified aortic. Diffuse interstitial prominence. Worsening retrocardiac consolidation and small LEFT pleural effusion. RIGHT lung base strandy densities. No pneumothorax. Surgical clips LEFT neck, stent projects in RIGHT neck and LEFT subclavian vessels. Postsurgical changes RIGHT distal clavicle. Bibasilar strandy densities IMPRESSION: Stable cardiomegaly. Worsening retrocardiac consolidation with small LEFT pleural effusion. RIGHT lung base atelectasis/ scarring. Worsening bronchitic changes. Electronically Signed   By: Elon Alas M.D.   On: 03/28/2016 19:40   Ct Angio Abdomen Pelvis  W &/or Wo Contrast  Result Date: 04/03/2016 CLINICAL DATA:  70 year old male with abdominal pain. Negative lactic acidosis. Renal failure with peritoneal dialysis catheter. EXAM: CT ANGIOGRAPHY  ABDOMEN AND PELVIS TECHNIQUE: Multidetector CT imaging through the abdomen and pelvis was performed using the standard protocol during bolus administration of  intravenous contrast. Delayed images included. Multiplanar reconstructed images including MIPs were obtained and reviewed to evaluate the vascular anatomy. CONTRAST:  100 cc Isovue 370 COMPARISON:  CT angiogram done the previous day, CT 03/28/2016, CT 11/26/2014 FINDINGS: VASCULAR Aorta: Extensive mixed calcified and soft plaque of the lower thoracic and abdominal aorta. No aneurysm. No dissection. No periaortic fluid. Celiac: Similar appearance of atherosclerotic changes at the origin of the celiac artery, which is patent though perhaps with 50% or greater stenosis. Post stenotic dilation. Typical branch pattern again noted, discussed on the prior CT. SMA: Similar stenotic appearance of the superior mesenteric artery with mixed calcified and soft plaque. Greater than 50% stenosis. Distal branches appear patent. Renals: Calcified and  soft plaque at the origin the bilateral renal arteries. Single bilateral renal arteries. IMA: Patent IMA. Likely stenosis at the origin. Difficult to quantify degree of stenosis. Right lower extremity: Mixed calcified and soft plaque of the right iliac system. Again, likely greater than 50% stenosis in the proximal right common iliac artery. Hypogastric artery and external iliac artery patent. Atherosclerotic disease throughout the external iliac artery. Common femoral artery in the proximal full more all vessels patent. Left lower extremity: Mixed calcified and soft plaque of the left iliac system. Likely stenosis at the distal common iliac artery. Hypogastric artery and external iliac artery patent. Disease through the length of the external iliac artery. Common femoral artery in the proximal fill more 0 vasculature patent on today's exam. Veins: Portal venous system remains patent. Unremarkable appearance of the systemic venous vasculature. Review of the MIP images confirms the above findings. NON-VASCULAR Lower chest: Respiratory motion somewhat limits evaluation of the lung bases.  Coverage of the lower chest/ field-of-view larger on the current study. Plaque like thickening of the left sided pleura at the lung base. This has been present since the CT 11/26/2014 Coronary calcifications.  Aortic valve calcifications. Hepatobiliary: Morphology of the liver compatible with cirrhosis with enlarged left lobe, caudate lobe, and nodular configuration. No focal lesion identified. Pancreas: Unremarkable appearance of pancreas Spleen: Borderline enlarged spleen Adrenals/Urinary Tract: Unremarkable appearance of adrenal glands. Right: Severe renal cortical thinning. No hydronephrosis or nephrolithiasis. Hypodense lesion on the lateral cortex and at the superior cortex too small to characterize. Left: Severe renal cortical thinning. No hydronephrosis. Similar appearance of low-density cystic structures, the larger of which are compatible with simple cysts. Smaller are too small to characterize. Excreted contrast within the urinary bladder. Stomach/Bowel: Unremarkable stomach. Small hiatal hernia. No evidence of small bowel obstruction or transition point. Minimal thickening of small bowel wall, persists after the prior CT. Small bowel wall enhances on the arterial and venous phase, with no CT evidence of ischemia. Retained oral contrast within colon, limiting evaluation of the mucosal surface. No evidence of obstruction. Small stool burden. Diverticular disease. Normal appendix. Lymphatic: Low-density ascites within the pericolic gutter, right upper quadrant, and within the dependent pelvis associated with the peritoneal dialysis catheter. No enhancement of the surface is the peritoneum and. Vascular/lymphatic congestion within small bowel mesenteric, similar to prior. Small lymph nodes, none of which are enlarged. Mesenteric: Fluid throughout the peritoneal of small volume. Small amount of anti dependent gas, similar to prior. Peritoneal dialysis catheter terminates within the low pelvis, adjacent to the  rectum. Reproductive: Unremarkable appearance of the pelvic organs. Other: Small fat containing umbilical hernia. Musculoskeletal: Sclerotic appearance of the skeletal elements. No acute bony abnormality. IMPRESSION: Vascular: No acute vascular abnormality. Similar to yesterday's study there is diffuse atherosclerotic changes of the abdominal aorta, with involvement of the mesenteric vasculature. Again, there is likely greater than 50% stenosis at the celiac artery and IMA artery, however, CTA resolution difficult to make precise assessment. The degree of stenosis at the SMA origin is likely high-grade given the post stenotic dilation and degree of disease. Distal branches of the mesenteric vessels remain patent. Bilateral renal artery stenosis. Portal venous system unremarkable. Systemic venous system unremarkable. Nonvascular: Morphology of the liver compatible with cirrhosis. Associated borderline splenomegaly, indicating developing portal hypertension. No evidence of bowel ischemia, with uniform enhancement of stomach and small bowel. Although the colon wall cannot be assessed with the presence of positive enteric contrast, there are no focal findings to suggest colonic ischemia.  Temporal resolution of mild small bowel thickening is improved with 24 hour follow-up CT, with mild thickening of the small bowel in the mid abdomen. There is associated lymphatic congestion of the small bowel mesenteric with associated small lymph nodes. Finding is nonspecific. There is no evidence of venous obstruction per se, although these findings can be seen with portal colopathy/enteropathy in the setting of cirrhosis and developing portal hypertension. Alternative consideration would be an nonspecific inflammatory or infectious enteritis. These results were called by telephone at the time of interpretation on 04/03/2016 at 8:56 am to Dr. Carles Collet, who verbally acknowledged these results. Small volume of low-density ascites, likely  related to PD catheter. PD catheter within the dependent pelvis adjacent to rectum. No evidence of peritonitis on the contrast-enhanced exam. Persistent thickening of the left posterior lower pleura, unchanged from the CT 11/26/2014. This may reflect rounded atelectasis/scarring. Further evaluation with chest CT may be considered if the patient has a history of carcinoma. Coronary artery disease.  Aortic valve calcifications. Signed, Dulcy Fanny. Earleen Newport, DO [facility] Radiology Electronically Signed   By: Corrie Mckusick D.O.   On: 04/03/2016 08:59   Ct Angio Abd/pel W And/or Wo Contrast  Result Date: 04/01/2016 CLINICAL DATA:  70 year old male with a history of lower abdominal pain and nausea vomiting. Constipation. EXAM: CTA ABDOMEN AND PELVIS WITH CONTRAST TECHNIQUE: Multidetector CT imaging of the abdomen and pelvis was performed using the standard protocol during bolus administration of intravenous contrast. Multiplanar reconstructed images and MIPs were obtained and reviewed to evaluate the vascular anatomy. CONTRAST:  75 cc Isovue 370 COMPARISON:  CT 03/28/2016, 11/26/2014, 08/06/2013 FINDINGS: VASCULAR Aorta: Diffuse atherosclerosis of the abdominal aorta with soft plaque and calcified plaque. No dissection. No aneurysm. No periaortic fluid. Celiac: Atherosclerotic changes at the origin of the celiac artery which is patent. Post stenotic dilation. Typical branch pattern of the celiac artery, with left gastric, common hepatic, and splenic artery identified. SMA: Atherosclerotic changes of the superior mesenteric artery, with mixed calcified and soft plaque at the origin. The SMA appears patent, with greater than 50% stenosis. Renals: Dense calcified and soft plaque at the origin the bilateral renal arteries. Renal arteries are patent. Single left and single right renal artery. IMA: Inferior mesenteric artery is patent, though appears stenotic at the origin.  Left colic artery patent. Superior rectal artery patent. Right lower extremity: Dense calcified and soft plaque involving the right iliac system, including common iliac artery and external iliac artery. Small caliber vessels, with likely multifocal stenoses. Greatest degree of stenosis may be at the distal common iliac artery above the hypogastric origin. Hypogastric artery is patent. Common femoral artery patent. Proximal SFA and profunda femoris patent. Left lower extremity: Mixed calcified and soft plaque of the left iliac system. Greatest degree of stenosis at the origin of the external iliac artery. Hypogastric artery patent. Common femoral artery patent. Proximal SFA and profunda femoris patent. Veins: Unremarkable appearance of the venous system. Review of the MIP images confirms the above findings. NON-VASCULAR Lower chest: Atelectatic changes at the lung bases. Small hiatal hernia. Hepatobiliary: Morphologic changes of the liver compatible with cirrhosis with developing left hepatic enlargement. Nodular contour. No focal lesion identified on the current CT. No intrahepatic or extrahepatic biliary ductal dilatation. Cholecystectomy Pancreas: Unremarkable appearance of the pancreas. No pericholecystic fluid or inflammatory changes. Unremarkable ductal system. Spleen: Borderline splenomegaly. Adrenals/Urinary Tract: Unremarkable appearance of adrenal glands. Right: No hydronephrosis. Advanced cortical renal thinning. Symmetric perfusion to the left.  Hypodense lesions within the cortex of the right kidney, the majority are too small to characterize though arm most likely benign cysts. Left: No hydronephrosis. Advanced cortical renal thinning come symmetric to the right. Symmetric perfusion. Cystic lesions in the cortex of the left kidney, most likely all benign cysts although the majority are too small to characterize. Unremarkable appearance of the urinary bladder with high density excreted contrast.  Stomach/Bowel: Unremarkable appearance of the stomach. Small hiatal hernia. Unremarkable appearance of small bowel. Venous phase demonstrates enhancement of the small bowel and colon, although the colon somewhat limited by the presence of enteric contrast. Colonic diverticula. Lymphatic: Multiple lymph nodes in the para-aortic nodal station, none of which are enlarged. Mesenteric: Low-density free fluid within the abdomen, associated with peritoneal dialysis catheter. Small volume of free air in the anti dependent aspect. Reproductive: Unremarkable appearance of the prostate and pelvic organs. Other: Small fat containing umbilical hernia. Unremarkable appearance of the tract of the dialysis catheter. Musculoskeletal: No evidence of acute fracture. Developing sclerotic appearance of the skeletal structures, potentially early renal osteodystrophy. No bony canal narrowing. No significant degenerative changes of the hips. IMPRESSION: VASCULAR No acute vascular abnormality identified. Advanced aortic atherosclerosis. There are associated changes at the origin of all mesenteric vessel, though they remain patent. There may be 50 percent stenosis at the celiac artery origin, with greater than 50 percent stenosis of the superior mesenteric artery origin, and potentially greater than 50 percent stenosis at the origin of the IMA, although degree of stenosis cannot be determined by CTA. This pattern of disease certainly places the patient at risk for chronic mesenteric ischemia. Correlation with patient's history may be useful, and potentially mesenteric duplex exam if there is concern for compromise. Bilateral renal artery stenosis, potentially contributing to advanced renal cortical thinning. NON-VASCULAR Morphologic changes of the liver compatible with cirrhosis. Referral for establishing gastroenterology care may be considered if not already established, as well as consideration of initiating surveillance for hepatocellular  carcinoma. Peritoneal dialysis catheter with free fluid likely associated with treatment. Tiny amount of anti dependent gas within the abdomen, again likely associated with the peritoneal dialysis. Again, bilateral renal cortical cysts. While these are most likely benign, there is incomplete characterization given the size. Developing renal osteodystrophy. Signed, Dulcy Fanny. Earleen Newport, DO [facility] Radiology Electronically Signed   By: Corrie Mckusick D.O.   On: 04/01/2016 09:00     CBC  Recent Labs Lab 04/13/16 0554 04/14/16 0800 04/16/16 1253 04/17/16 0530  WBC 10.2 10.5 13.3* 13.5*  HGB 9.4* 10.1* 9.8* 10.5*  HCT 31.2* 34.3* 34.7* 36.8*  PLT 259 289 234 235  MCV 106.1* 106.9* 111.2* 109.5*  MCH 32.0 31.5 31.4 31.3  MCHC 30.1 29.4* 28.2* 28.5*  RDW 17.4* 17.8* 18.5* 18.1*    Chemistries   Recent Labs Lab 04/13/16 0554 04/14/16 0403 04/14/16 1209 04/16/16 1253 04/17/16 0530  NA 138 138  --  143 139  K 4.4 3.9  --  4.0 4.6  CL 102 100*  --  104 98*  CO2 18* 21*  --  23 25  GLUCOSE 135* 112*  --  66 73  BUN 40* 22*  --  36* 20  CREATININE 8.69* 5.30*  --  6.86* 4.46*  CALCIUM 8.5* 8.3*  --  8.7* 8.9  MG 2.0  --   --  2.5*  --   AST 41  --  55* 47* 56*  ALT 32  --  35 33 35  ALKPHOS 79  --  92 84 90  BILITOT 1.2  --  0.8 1.0 1.3*   ------------------------------------------------------------------------------------------------------------------ estimated creatinine clearance is 16.1 mL/min (by C-G formula based on SCr of 4.46 mg/dL (H)). ------------------------------------------------------------------------------------------------------------------ No results for input(s): HGBA1C in the last 72 hours. ------------------------------------------------------------------------------------------------------------------ No results for input(s): CHOL, HDL, LDLCALC, TRIG, CHOLHDL, LDLDIRECT in the last 72  hours. ------------------------------------------------------------------------------------------------------------------ No results for input(s): TSH, T4TOTAL, T3FREE, THYROIDAB in the last 72 hours.  Invalid input(s): FREET3 ------------------------------------------------------------------------------------------------------------------ No results for input(s): VITAMINB12, FOLATE, FERRITIN, TIBC, IRON, RETICCTPCT in the last 72 hours.  Coagulation profile  Recent Labs Lab 04/15/16 0340  INR 1.19    No results for input(s): DDIMER in the last 72 hours.  Cardiac Enzymes No results for input(s): CKMB, TROPONINI, MYOGLOBIN in the last 168 hours.  Invalid input(s): CK ------------------------------------------------------------------------------------------------------------------ Invalid input(s): POCBNP   CBG:  Recent Labs Lab 04/11/16 1756 04/11/16 2158 04/12/16 0747  GLUCAP 94 112* 126*       Studies: No results found.    Lab Results  Component Value Date   HGBA1C 5.5 04/01/2016   HGBA1C 6.3 (H) 08/11/2013   Lab Results  Component Value Date   CREATININE 4.46 (H) 04/17/2016       Scheduled Meds: . aspirin EC  81 mg Oral Daily  . darbepoetin (ARANESP) injection - DIALYSIS  60 mcg Intravenous Q Sat-HD  . famotidine  20 mg Oral Daily  . feeding supplement (NEPRO CARB STEADY)  237 mL Oral BID BM  . fluconazole  200 mg Oral QHS  . heparin subcutaneous  5,000 Units Subcutaneous Q8H  . ipratropium  2 spray Each Nare BID  . lidocaine  1 patch Transdermal Q24H  . mouth rinse  15 mL Mouth Rinse BID  .  morphine injection  2 mg Intravenous Once  . multivitamin  1 tablet Oral QHS  . saccharomyces boulardii  250 mg Oral BID  . sodium chloride flush  3 mL Intravenous Q12H   Continuous Infusions:     LOS: 9 days    Apollo Timothy, MD, FACP, FHM. [facility] Pager [phone]  If 7PM-7AM, please contact night-coverage [email or web address] Password  [email or web address] 04/18/2016, 2:30 PM

## 2016-04-18 NOTE — Progress Notes (Signed)
SLP Cancellation Note  Patient Details Name: Victor Lane MRN: [id number] DOB: 04/01/1946   Cancelled treatment:       Reason Eval/Treat Not Completed: Patient at procedure or test/unavailable; pt in HD   ADAMS,PAT, M.S., CCC-SLP 04/18/2016, 12:17 PM

## 2016-04-18 NOTE — Procedures (Signed)
I was present at this session.  I have reviewed the session itself and made appropriate changes.  Hd via [place].  bp ok,minimal vol . Confused  Shaquile Lutze L 10/28/20178:18 AM

## 2016-04-18 NOTE — Progress Notes (Signed)
Nutrition Brief Note  RD paged by RN regarding patient's diarrhea. He is drinking Nepro BID and RN unsure if Nepro may be worsening diarrhea. Patient is on fluconazole, which may be causing diarrhea. RN asking if can switch to [employer] instead of Nepro. Reviewed labs, last Hgb A1c was 5.5 on 04/01/2016. Will discontinue Nepro BID and order patient for Boost Breeze TID. Needs to be thickened to appropriate consistency as was being done with Nepro.  Body mass index is 22.75 kg/m. Patient meets criteria for Normal Weight based on current BMI.   Current diet order is Dysphagia 1 with Nectar Thick Liquids, patient is consuming approximately 10-50% of meals at this time. Labs and medications reviewed.    Willey Blade, MS, RD, LDN Pager: [phone] After Hours Pager: [phone]

## 2016-04-18 NOTE — Progress Notes (Signed)
[facility] Progress Note  Dialysis Orders: PD transitioning  to HD  Assessment/Plan: 1. Peritonitis, fungal - PD cath out 10/20 on PO Diflucan  2. ESRD - clipped to MWF2 at [facility] - on TTS here - order for chair next HD - need to make sure he can transition to OP HD 3. Anemia - Hgb 10.5 Aranesp 60 q Sat (given 04/18/16)  4. Secondary hyperparathyroidism -  5. HTN/volume - BP controlled HD today UF goal 1L  6. Nutrition - Alb 2.0 ST following - Now on dys diet Very poor intake 7 Encephalopathy still very confused but higher level alertness  Ogechi Larina Earthly PA-C [facility] Pager [phone] 04/18/2016,8:07 AM  LOS: 9 days  I have seen and examined this patient and agree with the plan of care seen, examined, eval. Discussed with staff.  Confusion biggest issue ?? Cause. .  Aashrith Eves L 04/18/2016, 9:41 AM   Subjective: Seen on HD. More alert oriented to time and place.  Objective Vitals:   04/17/16 0552 04/17/16 1449 04/17/16 2247 04/18/16 0634  BP: 131/64 (!) 152/55 (!) 128/59 135/69  Pulse: 97 (!) 107 (!) 108 (!) 109  Resp: 16 18 17 18   Temp: 97.4 F (36.3 C) 97.5 F (36.4 C) 97.7 F (36.5 C) 97.6 F (36.4 C)  TempSrc: Oral Oral Oral Oral  SpO2: 100% 97% 97% 100%  Weight:      Height:       Physical Exam General: Frail elderly appearing WM speech, responsive  Confused, aggitated at times Heart: RRR mild systolic ejection murmur  Lungs: Lungs CTAB  Abdomen: soft mild tenderness to palpation PD site clean incision lower abdm Extremities: no LE edema  Dialysis Access: L AVF cannulated   Additional Objective Labs: Basic Metabolic Panel:  Recent Labs Lab 04/13/16 0554 04/14/16 0403 04/16/16 1253 04/17/16 0530  NA 138 138 143 139  K 4.4 3.9 4.0 4.6  CL 102 100* 104 98*  CO2 18* 21* 23 25  GLUCOSE 135* 112* 66 73  BUN 40* 22* 36* 20  CREATININE 8.69* 5.30* 6.86* 4.46*  CALCIUM 8.5* 8.3* 8.7* 8.9  PHOS 4.7*  --  5.2*  --     Liver Function Tests:  Recent Labs Lab 04/14/16 1209 04/16/16 1253 04/17/16 0530  AST 55* 47* 56*  ALT 35 33 35  ALKPHOS 92 84 90  BILITOT 0.8 1.0 1.3*  PROT 5.8* 5.5* 5.9*  ALBUMIN 1.8* 1.9* 2.0*    Recent Labs Lab 04/14/16 1209  LIPASE 51   CBC:  Recent Labs Lab 04/13/16 0554 04/14/16 0800 04/16/16 1253 04/17/16 0530  WBC 10.2 10.5 13.3* 13.5*  HGB 9.4* 10.1* 9.8* 10.5*  HCT 31.2* 34.3* 34.7* 36.8*  MCV 106.1* 106.9* 111.2* 109.5*  PLT 259 289 234 235   Blood Culture    Component Value Date/Time   SDES CSF 04/14/2016 1254   SPECREQUEST TUBE 2 04/14/2016 1254   CULT NO GROWTH 3 DAYS 04/14/2016 1254   REPTSTATUS PENDING 04/14/2016 1254    Cardiac Enzymes: No results for input(s): CKTOTAL, CKMB, CKMBINDEX, TROPONINI in the last 168 hours. CBG:  Recent Labs Lab 04/11/16 0810 04/11/16 1315 04/11/16 1756 04/11/16 2158 04/12/16 0747  GLUCAP 82 88 94 112* 126*   Iron Studies: No results for input(s): IRON, TIBC, TRANSFERRIN, FERRITIN in the last 72 hours. Lab Results  Component Value Date   INR 1.19 04/15/2016   INR 1.34 04/08/2016   INR 1.23 04/02/2016   Medications:   .  aspirin EC  81 mg Oral Daily  . darbepoetin (ARANESP) injection - DIALYSIS  60 mcg Intravenous Q Sat-HD  . famotidine  20 mg Oral Daily  . feeding supplement (NEPRO CARB STEADY)  237 mL Oral BID BM  . fluconazole  200 mg Oral QHS  . heparin subcutaneous  5,000 Units Subcutaneous Q8H  . ipratropium  2 spray Each Nare BID  . lidocaine  1 patch Transdermal Q24H  . mouth rinse  15 mL Mouth Rinse BID  .  morphine injection  2 mg Intravenous Once  . multivitamin  1 tablet Oral QHS  . saccharomyces boulardii  250 mg Oral BID  . sodium chloride flush  3 mL Intravenous Q12H

## 2016-04-19 DIAGNOSIS — N186 End stage renal disease: Secondary | ICD-10-CM

## 2016-04-19 DIAGNOSIS — Z992 Dependence on renal dialysis: Secondary | ICD-10-CM

## 2016-04-19 DIAGNOSIS — I5022 Chronic systolic (congestive) heart failure: Secondary | ICD-10-CM

## 2016-04-19 LAB — GLUCOSE, CAPILLARY: GLUCOSE-CAPILLARY: 171 mg/dL — AB (ref 65–99)

## 2016-04-19 MED ORDER — SODIUM CHLORIDE 0.9 % IV SOLN
INTRAVENOUS | Status: DC
  Administered 2016-04-19: 10:00:00 via INTRAVENOUS

## 2016-04-19 MED ORDER — RISPERIDONE 0.25 MG PO TABS
0.2500 mg | ORAL_TABLET | Freq: Two times a day (BID) | ORAL | Status: DC | PRN
  Filled 2016-04-19: qty 1

## 2016-04-22 MED FILL — Medication: Qty: 1 | Status: AC

## 2016-04-22 NOTE — Progress Notes (Signed)
Late entry; note from 0741.the patient. Yells and screams out in pain most of the night, and Tylenol ineffective,as well as his po meds.He refuses to eat. Bed alarm and camera active. He continues to climb over side rails. Pt. States his stomach hurts and also c/o of nausea. Remains incontinent of stool. 1st. Shift states this type of behavior is not displayed during the day,because wife is able to console pt.

## 2016-04-22 NOTE — Progress Notes (Signed)
Subjective: Interval History: still freq confused, halluc.  Objective: Vital signs in last 24 hours: Temp:  [97.4 F (36.3 C)-98 F (36.7 C)] 97.4 F (36.3 C) (10/28 2030) Pulse Rate:  [104-117] 109 (10/28 2030) Resp:  [17-24] 22 (10/28 2030) BP: (86-152)/(50-70) 123/62 (10/28 2030) SpO2:  [100 %] 100 % (10/28 2030) Weight:  [74 kg (163 lb 2.3 oz)] 74 kg (163 lb 2.3 oz) (10/28 1155) Weight change:   Intake/Output from previous day: No intake/output data recorded. Intake/Output this shift: No intake/output data recorded.  General appearance: pale and confused, calling out Resp: clear to auscultation bilaterally Cardio: S1, S2 normal and systolic murmur: holosystolic 2/6, blowing at apex GI: mildly tender, pos bs, soft, incision lower abdm ES healing Extremities: AVF LUA  Lab Results:  Recent Labs  04/16/16 1253 04/17/16 0530  WBC 13.3* 13.5*  HGB 9.8* 10.5*  HCT 34.7* 36.8*  PLT 234 235   BMET:  Recent Labs  04/16/16 1253 04/17/16 0530  NA 143 139  K 4.0 4.6  CL 104 98*  CO2 23 25  GLUCOSE 66 73  BUN 36* 20  CREATININE 6.86* 4.46*  CALCIUM 8.7* 8.9   No results for input(s): PTH in the last 72 hours. Iron Studies: No results for input(s): IRON, TIBC, TRANSFERRIN, FERRITIN in the last 72 hours.  Studies/Results: No results found.  I have reviewed the patient's current medications.  Assessment/Plan: 1 ESRD Hd going well, acid/base/K 2 Anemia improving 3 Confusion  Persists  ??psychiatric vs post severe illness. No other evident cause. 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

## 2016-04-22 NOTE — Progress Notes (Addendum)
SLP Cancellation Note  Patient Details Name: DEVINE SCURTO MRN: [id number] DOB: 1945/08/04   Cancelled treatment:       Reason Eval/Treat Not Completed: Medical issues which prohibited therapy. Pt appeared short of breath at bedside, minimally responsive but did initially turn to look at me, shrugged shoulders when asked how he was feeling. Wife at bedside stated that pt "not doing well" today, she reported that RN had recently checked O2s after SOB was evident but that O2s were normal at that time. Elevated HOB and provided oral care with suction (it was evident that pt had bits of food at base of tongue and some in buccal cavities) but did not provide any PO intake. Shortly after this while talking with wife, noticed that pt continued SOB and then had a blank stare, did not turn or respond. Informed RN and crash cart was called shortly after.   Kern Reap, [place], CCC-SLP 2016/05/01, 11:29 AM [phone]

## 2016-04-22 NOTE — Discharge Summary (Signed)
Monroe Qin [name] HWE:[id number] DOB: 1946-03-19   PCP: Beatris Si  Admit date: 2016/05/07 Date of Death: 05-17-2016 Time of Death: 11:57 AM Notification: Corine Shelter, PA-C notified of death of 17-May-2016   History of present illness and Hospital course:  70 y.o. 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 He had been recently hospitalized 10/11-10/14 due to peritonitis. His nephrologist advised him to come to the ED due to refractory peritonitis and for removal of PD cath. Patient was confused on arrival and complained of abdominal pain.  Refractory peritonitis/fungal peritonitis/HCAP/sepsis: He met sepsis criteria on admission and sepsis was felt to be secondary to persistent or recurrent peritonitis. He was briefly resuscitated with IV fluids. Surgery was consulted. Peritoneal dialysis catheter was removed. Initially treated empirically with IV vancomycin and Zosyn. Subsequently Diflucan was added due to outpatient cultures showing Candida in peritoneal fluid.  Acute encephalopathy: Exact etiology could not be determined. Patient underwent extensive workup.? Multifactorial related to infection i.e. peritonitis, ESRD, pain, hospital delirium and unclear if he had some underlying mild cognitive impairment or dementia. This did not improve during the course of hospitalization and worsened briefly prior to demise.  ESRD: Peritoneal dialysis catheter was removed and HD was started. Nephrology continue to see patient this admission  Patient was seen and evaluated earlier this morning. He was not doing well. He had been having diarrhea overnight which was felt to be related to Nepro which had been discontinued. Had  been restless. Mental status changes also worsened associated with hallucinations. I discussed extensively with patient's spouse at bedside this morning and made some medication changes. Discussed with nephrology. Subsequently during late morning hours, patient went into a PEA arrest. Immediately arrived to bedside. Rapid response/code team was running the code. Despite approximately 22 minutes of resuscitation, no [place]. Discussed several times during the course of the code with patient's spouse at bedside. Updated ongoing resuscitation and poor prognosis at that time. Spouse advised stopping resuscitation. Resuscitation was discontinued and patient was pronounced dead at 11:57 AM on 2016-05-17. Subsequently met spouse and patient's son along with chaplain. Offered condolences and comforted them.  Other issues that were addressed during this admission are as follows:  Hypokalemia CAD status post stent Essential hypertension Chronic systolic CHF DM 2 Cardiomyopathy/NSVT: EF worsened this admission to 20-25 percent.  Diarrhea Anemia  Final Diagnoses:  1. Sepsis secondary to fungal peritonitis in the context of peritoneal dialysis 2. ESRD-newly on HD 3. Acute encephalopathy 4. Diarrhea 5. Cardiomyopathy  The results of significant diagnostics from this hospitalization (including imaging, microbiology, ancillary and laboratory) are listed below for reference.    Significant Diagnostic Studies: Dg Chest 1 View  Result Date: 04/13/2016 CLINICAL DATA:  Sharp chest pain EXAM: CHEST 1 VIEW COMPARISON:  2016/05/07 FINDINGS: Cardiomegaly and congested vascular appearance. Trace bilateral pleural fluid or thickening. Extensive atherosclerosis of the aorta. Multiple stents of the left axilla. IMPRESSION: Cardiomegaly and pulmonary venous congestion. Electronically Signed   By: Monte Fantasia M.D.   On: 04/13/2016 10:05   Dg Chest 2 View  Result Date: 04/01/2016 CLINICAL DATA:  Suspected sepsis.   Peritoneal dialysis patient. EXAM: CHEST  2 VIEW COMPARISON:  Chest radiograph 07/19/2015.  Abdominal CT 03/28/2016 FINDINGS: Stable  cardiomegaly and mediastinal contours. There is thoracic aortic atherosclerosis. Small bilateral pleural effusions and bibasilar airspace disease. No pulmonary edema. No pneumothorax. Vascular stent in the left axilla. Free air under the right hemidiaphragm, likely related to peritoneal dialysis. Resorption of the distal right clavicle. IMPRESSION: Chronic cardiomegaly, stable.  Thoracic aortic atherosclerosis. Small bilateral pleural effusions with bibasilar airspace disease, likely atelectasis. Electronically Signed   By: Jeb Levering M.D.   On: 04/01/2016 05:40   Dg Thoracic Spine 2 View  Result Date: 04/12/2016 CLINICAL DATA:  70 year old male with history of back pain and weight loss. EXAM: THORACIC SPINE 2 VIEWS COMPARISON:  No priors. FINDINGS: There is no evidence of thoracic spine fracture. Alignment is normal. Mild multilevel degenerative disc disease, typical for the patient's age. No other significant bone abnormalities are identified. Aortic atherosclerosis. Vascular stent projecting over the left axillary region, incompletely visualized. IMPRESSION: 1. No acute radiographic abnormality of the thoracic spine. 2. Aortic atherosclerosis. Electronically Signed   By: Vinnie Langton M.D.   On: 04/12/2016 15:19   Dg Lumbar Spine 2-3 Views  Result Date: 04/12/2016 CLINICAL DATA:  Back pain lumbar region. EXAM: LUMBAR SPINE - 2-3 VIEW COMPARISON:  04/04/2013 FINDINGS: Vertebral body alignment, heights and disc space heights are normal. There is mild spondylosis throughout the lumbar spine to include facet arthropathy over the lower lumbar spine. No compression fracture or subluxation. Moderate calcified plaque over the abdominal aorta and iliac arteries IMPRESSION: Mild spondylosis of the lumbar spine to include facet arthropathy. Electronically Signed   By: Marin Olp M.D.   On: 04/12/2016 15:25   Dg Pelvis 1-2 Views  Result Date: 04/12/2016 CLINICAL DATA:  Low back pain. Unable to obtain history from patient. EXAM: PELVIS - 1-2 VIEW COMPARISON:  None. FINDINGS: Mild symmetric degenerative change of the hips. Mild diffuse decreased bone mineralization is present. There is no acute fracture or dislocation. Skin staples are present over the soft tissues of the left hip region. Several pelvic phleboliths. Calcified plaque over the iliac and femoral arteries. IMPRESSION: No acute findings. Electronically Signed   By: Marin Olp M.D.   On: 04/12/2016 15:26   Mr Brain Wo Contrast  Result Date: 04/13/2016 CLINICAL DATA:  Altered mental status. EXAM: MRI HEAD WITHOUT CONTRAST TECHNIQUE: Multiplanar, multiecho pulse sequences of the brain and surrounding structures were obtained without intravenous contrast. COMPARISON:  None FINDINGS: Brain: Motion degraded exam. Only sagittal T1 weighted imaging, axial diffusion, axial T2, and axial FLAIR sequences could be obtained before patient refused to continue. No acute infarct, hemorrhage, hydrocephalus, or swelling. Small-vessel ischemic change in the deep cerebral white matter, mild for age. Remote lacunar infarct in the left caudate. Mild generalized cerebral volume loss. Vascular: Normal flow voids. Skull and upper cervical spine: Normal marrow signal. Sinuses/Orbits: Chronic sinusitis including complete opacification of the right maxillary antrum with inspissated secretions. Mild mucosal thickening in the mastoids. IMPRESSION: 1. No acute finding, including infarct. 2. Generalized atrophy and mild chronic microvascular ischemic change. 3. Partial, motion degraded exam. Electronically Signed   By: Monte Fantasia M.D.   On: 04/13/2016 10:41   Dg Chest Left Decubitus  Result Date: 04/13/2016 CLINICAL DATA:  Sharp chest pain. EXAM: CHEST - LEFT DECUBITUS COMPARISON:  Chest x-ray from earlier today FINDINGS: Trace  bilateral pleural fluid or thickening. No significant layering fluid on left side down decubitus. No right-sided pneumothorax is noted. Cardiomegaly and vascular congestion. IMPRESSION: Left side down decubitus shows no significant layering pleural fluid or right-sided pneumothorax.  Electronically Signed   By: Monte Fantasia M.D.   On: 04/13/2016 10:04   Ct Abdomen Pelvis W Contrast  Result Date: 04/14/2016 CLINICAL DATA:  Patient is poor historian. History of ascites. Sepsis on present a shin. Tachycardia, tachypnea and hypotension. EXAM: CT ABDOMEN AND PELVIS WITH CONTRAST TECHNIQUE: Multidetector CT imaging of the abdomen and pelvis was performed using the standard protocol following bolus administration of intravenous contrast. CONTRAST:  157m ISOVUE-300 IOPAMIDOL (ISOVUE-300) INJECTION 61% COMPARISON:  04/02/2016, 04/01/2016, 11/26/2014, 08/06/2013 FINDINGS: Lower chest: Stable appearance of mild right middle lobe subpleural consolidation and fibrosis. Moderate emphysematous disease involving the inferior aspects of the upper lobes and the bilateral lower lobes. Persistent oblong areas of masslike consolidation along the left lower lobe posterior pleural surface and the pleural surface of the lingula laterally. These are similar in appearance compared with 04/02/2016, slightly decreased from 11/26/2014. Dense atherosclerotic vascular disease of the imaged portions of the aorta. Mildly ectatic ascending aorta, measuring up to 3.9 cm, unchanged. Marked coronary artery calcification. Heart size is slightly enlarged. No large pericardial effusion. Mitral valvular calcifications are also noted. Small amount of left greater than right gynecomastia. Hepatobiliary: Nodular contour of the liver, consistent with cirrhosis. No gross focal hepatic abnormalities. Surgical clips in the gallbladder fossa consistent with prior cholecystectomy. No biliary dilatation. Pancreas: Unremarkable. No pancreatic ductal  dilatation or surrounding inflammatory changes. Marked vascular calcifications. Spleen: Borderline enlargement of the spleen measuring 12 cm, unchanged. Now seen is a tiny slightly wedge-shaped relatively peripheral hypodensity in the posterior spleen measuring 9 mm. Normal splenic vein and portal vein enhancement. Adrenals/Urinary Tract: Mild nodular thickening of the left adrenal gland is unchanged. Kidneys are atrophic. No hydronephrosis. Stable hypodense lesion in the mid cortex of right kidney. Stable dominant cyst in the midpole of left kidney. Additional hypodense lesions in the left kidney as before. Stomach/Bowel: Stomach contains fluid. There is no dilated small bowel to suggest an obstruction. There is mild thickening of small bowel loops in the left hemi abdomen, series 2, image number 56. This does not appear significantly changed. No definite intramural air present within the thickened small bowel loop. There is diverticular disease of the sigmoid colon. Minimal wall thickening. Vascular/Lymphatic: Dense vascular calcifications within the aorta. No aneurysm. No pathologically enlarged mesenteric or pelvic nodes. Reproductive: Prostate is unremarkable. Other: Small amount of abdominal and pelvic ascites, decreased compared to the previous exam. The peritoneal pelvic dialysis catheter has been removed. No free air. Musculoskeletal: Degenerative changes of the spine. No acute osseous abnormality. IMPRESSION: 1. Moderate emphysematous disease at the bilateral lung bases with persistent ovoid areas of consolidation at the pleural surface of the lingula and left lower lobe posteriorly, findings were present dating back to 11/26/2014, query chronic consolidation/atelectasis with pleural based mass(es) also remaining in the differential. 2. Liver cirrhosis. Borderline enlarged spleen. Spleen now demonstrates a 9 mm somewhat wedge-shaped peripheral opacity, question small infarct. Patent splenic and portal  veins. 3. Removal of pelvic peritoneal dialysis catheter. There is decreased abdominal and pelvic ascites. 4. Persistent thickened fluid-filled small bowel loops in the left hemi abdomen, possible inflammatory change. No definite intramural air or portal venous gas is present. 5. Atrophic kidney with bilateral hypodense lesions probable cyst as before. 6. Sigmoid diverticulosis with slight wall thickening of the sigmoid colon. Electronically Signed   By: KDonavan FoilM.D.   On: 04/14/2016 18:20   Ct Abdomen Pelvis W Contrast  Result Date: 03/29/2016 CLINICAL DATA:  Acute onset of generalized abdominal pain.  Recent peritoneal dialysis. Vomiting and diarrhea. Initial encounter. EXAM: CT ABDOMEN AND PELVIS WITH CONTRAST TECHNIQUE: Multidetector CT imaging of the abdomen and pelvis was performed using the standard protocol following bolus administration of intravenous contrast. CONTRAST:  135m ISOVUE-300 IOPAMIDOL (ISOVUE-300) INJECTION 61% COMPARISON:  CT of the abdomen and pelvis from 11/26/2014 FINDINGS: Lower chest: Trace right-sided pleural fluid is noted. Bibasilar atelectasis or scarring is seen. The visualized portions of the mediastinum are unremarkable. Hepatobiliary: A small amount of ascites is seen tracking about the liver. The liver is unremarkable in appearance. The patient is status post cholecystectomy, with clips noted at the gallbladder fossa. The common bile duct remains normal in caliber. Pancreas: The pancreas is within normal limits. Spleen: The spleen is unremarkable in appearance. Adrenals/Urinary Tract: The adrenal glands are grossly unremarkable in appearance. Severe chronic bilateral renal atrophy is noted, with scattered bilateral renal cysts. There is no evidence of hydronephrosis. No renal or ureteral stones are identified. No significant perinephric stranding is seen. Stomach/Bowel: The stomach is unremarkable in appearance. The small bowel is within normal limits. The appendix is  normal in caliber, without evidence of appendicitis. Scattered diverticulosis is noted along the distal descending and sigmoid colon, without definite evidence of diverticulitis. Vascular/Lymphatic: Diffuse calcification is seen along the abdominal aorta and its branches. There is relatively severe luminal narrowing at the proximal right common iliac artery, and moderate luminal narrowing along the left common iliac artery. Relatively severe luminal narrowing is also noted along the proximal left superficial femoral artery. No retroperitoneal or pelvic sidewall lymphadenopathy is seen. Reproductive: The bladder is mildly distended and grossly unremarkable. The prostate remains normal in size. Other: The patient's peritoneal dialysis catheter is noted within the pelvis, with a small to moderate amount of free fluid in the pelvis and along both paracolic gutters. Musculoskeletal: No acute osseous abnormalities are identified. The visualized musculature is unremarkable in appearance. IMPRESSION: 1. No acute abnormality seen to explain the patient's symptoms. 2. Status post recent peritoneal dialysis. Small to moderate volume ascites noted within the abdomen and pelvis. 3. Diffuse aortic atherosclerosis noted. Relatively severe luminal narrowing at the proximal right common iliac artery, and moderate luminal narrowing along the left common iliac artery. Relatively severe luminal narrowing also noted along the proximal left superficial femoral artery. 4. Severe chronic bilateral renal atrophy, with scattered bilateral renal cysts. 5. Scattered diverticulosis along the distal descending and sigmoid colon, without definite evidence of diverticulitis. 6. Trace right-sided pleural fluid. Bibasilar atelectasis or scarring noted. Electronically Signed   By: JGarald BaldingM.D.   On: 03/29/2016 00:10   Dg Chest Port 1 View  Result Date: 04/06/2016 CLINICAL DATA:  Sepsis.  History of head and neck cancer. EXAM: PORTABLE  CHEST 1 VIEW COMPARISON:  Chest radiograph April 01, 2016 FINDINGS: Cardiac silhouette is mildly enlarged unchanged. Heavily calcified aortic. Diffuse interstitial prominence. Worsening retrocardiac consolidation and small LEFT pleural effusion. RIGHT lung base strandy densities. No pneumothorax. Surgical clips LEFT neck, stent projects in RIGHT neck and LEFT subclavian vessels. Postsurgical changes RIGHT distal clavicle. Bibasilar strandy densities IMPRESSION: Stable cardiomegaly. Worsening retrocardiac consolidation with small LEFT pleural effusion. RIGHT lung base atelectasis/ scarring. Worsening bronchitic changes. Electronically Signed   By: CElon AlasM.D.   On: 03/25/2016 19:40   Ct Angio Abdomen Pelvis  W &/or Wo Contrast  Result Date: 04/03/2016 CLINICAL DATA:  70year old male with abdominal pain. Negative lactic acidosis. Renal failure with peritoneal dialysis catheter. EXAM: CT ANGIOGRAPHY  ABDOMEN AND PELVIS TECHNIQUE:  Multidetector CT imaging through the abdomen and pelvis was performed using the standard protocol during bolus administration of intravenous contrast. Delayed images included. Multiplanar reconstructed images including MIPs were obtained and reviewed to evaluate the vascular anatomy. CONTRAST:  100 cc Isovue 370 COMPARISON:  CT angiogram done the previous day, CT 03/28/2016, CT 11/26/2014 FINDINGS: VASCULAR Aorta: Extensive mixed calcified and soft plaque of the lower thoracic and abdominal aorta. No aneurysm. No dissection. No periaortic fluid. Celiac: Similar appearance of atherosclerotic changes at the origin of the celiac artery, which is patent though perhaps with 50% or greater stenosis. Post stenotic dilation. Typical branch pattern again noted, discussed on the prior CT. SMA: Similar stenotic appearance of the superior mesenteric artery with mixed calcified and soft plaque. Greater than 50% stenosis. Distal branches appear patent. Renals: Calcified and soft plaque at  the origin the bilateral renal arteries. Single bilateral renal arteries. IMA: Patent IMA. Likely stenosis at the origin. Difficult to quantify degree of stenosis. Right lower extremity: Mixed calcified and soft plaque of the right iliac system. Again, likely greater than 50% stenosis in the proximal right common iliac artery. Hypogastric artery and external iliac artery patent. Atherosclerotic disease throughout the external iliac artery. Common femoral artery in the proximal full more all vessels patent. Left lower extremity: Mixed calcified and soft plaque of the left iliac system. Likely stenosis at the distal common iliac artery. Hypogastric artery and external iliac artery patent. Disease through the length of the external iliac artery. Common femoral artery in the proximal fill more 0 vasculature patent on today's exam. Veins: Portal venous system remains patent. Unremarkable appearance of the systemic venous vasculature. Review of the MIP images confirms the above findings. NON-VASCULAR Lower chest: Respiratory motion somewhat limits evaluation of the lung bases. Coverage of the lower chest/ field-of-view larger on the current study. Plaque like thickening of the left sided pleura at the lung base. This has been present since the CT 11/26/2014 Coronary calcifications.  Aortic valve calcifications. Hepatobiliary: Morphology of the liver compatible with cirrhosis with enlarged left lobe, caudate lobe, and nodular configuration. No focal lesion identified. Pancreas: Unremarkable appearance of pancreas Spleen: Borderline enlarged spleen Adrenals/Urinary Tract: Unremarkable appearance of adrenal glands. Right: Severe renal cortical thinning. No hydronephrosis or nephrolithiasis. Hypodense lesion on the lateral cortex and at the superior cortex too small to characterize. Left: Severe renal cortical thinning. No hydronephrosis. Similar appearance of low-density cystic structures, the larger of which are compatible  with simple cysts. Smaller are too small to characterize. Excreted contrast within the urinary bladder. Stomach/Bowel: Unremarkable stomach. Small hiatal hernia. No evidence of small bowel obstruction or transition point. Minimal thickening of small bowel wall, persists after the prior CT. Small bowel wall enhances on the arterial and venous phase, with no CT evidence of ischemia. Retained oral contrast within colon, limiting evaluation of the mucosal surface. No evidence of obstruction. Small stool burden. Diverticular disease. Normal appendix. Lymphatic: Low-density ascites within the pericolic gutter, right upper quadrant, and within the dependent pelvis associated with the peritoneal dialysis catheter. No enhancement of the surface is the peritoneum and. Vascular/lymphatic congestion within small bowel mesenteric, similar to prior. Small lymph nodes, none of which are enlarged. Mesenteric: Fluid throughout the peritoneal of small volume. Small amount of anti dependent gas, similar to prior. Peritoneal dialysis catheter terminates within the low pelvis, adjacent to the rectum. Reproductive: Unremarkable appearance of the pelvic organs. Other: Small fat containing umbilical hernia. Musculoskeletal: Sclerotic appearance of the skeletal elements. No acute bony  abnormality. IMPRESSION: Vascular: No acute vascular abnormality. Similar to yesterday's study there is diffuse atherosclerotic changes of the abdominal aorta, with involvement of the mesenteric vasculature. Again, there is likely greater than 50% stenosis at the celiac artery and IMA artery, however, CTA resolution difficult to make precise assessment. The degree of stenosis at the SMA origin is likely high-grade given the post stenotic dilation and degree of disease. Distal branches of the mesenteric vessels remain patent. Bilateral renal artery stenosis. Portal venous system unremarkable. Systemic venous system unremarkable. Nonvascular: Morphology of the  liver compatible with cirrhosis. Associated borderline splenomegaly, indicating developing portal hypertension. No evidence of bowel ischemia, with uniform enhancement of stomach and small bowel. Although the colon wall cannot be assessed with the presence of positive enteric contrast, there are no focal findings to suggest colonic ischemia. Temporal resolution of mild small bowel thickening is improved with 24 hour follow-up CT, with mild thickening of the small bowel in the mid abdomen. There is associated lymphatic congestion of the small bowel mesenteric with associated small lymph nodes. Finding is nonspecific. There is no evidence of venous obstruction per se, although these findings can be seen with portal colopathy/enteropathy in the setting of cirrhosis and developing portal hypertension. Alternative consideration would be an nonspecific inflammatory or infectious enteritis. These results were called by telephone at the time of interpretation on 04/03/2016 at 8:56 am to Dr. Carles Collet, who verbally acknowledged these results. Small volume of low-density ascites, likely related to PD catheter. PD catheter within the dependent pelvis adjacent to rectum. No evidence of peritonitis on the contrast-enhanced exam. Persistent thickening of the left posterior lower pleura, unchanged from the CT 11/26/2014. This may reflect rounded atelectasis/scarring. Further evaluation with chest CT may be considered if the patient has a history of carcinoma. Coronary artery disease.  Aortic valve calcifications. Signed, Dulcy Fanny. Earleen Newport, DO [facility] Electronically Signed   By: Corrie Mckusick D.O.   On: 04/03/2016 08:59   Ct Angio Abd/pel W And/or Wo Contrast  Result Date: 04/01/2016 CLINICAL DATA:  70 year old male with a history of lower abdominal pain and nausea vomiting. Constipation. EXAM: CTA ABDOMEN AND PELVIS WITH CONTRAST TECHNIQUE: Multidetector CT imaging of the  abdomen and pelvis was performed using the standard protocol during bolus administration of intravenous contrast. Multiplanar reconstructed images and MIPs were obtained and reviewed to evaluate the vascular anatomy. CONTRAST:  75 cc Isovue 370 COMPARISON:  CT 03/28/2016, 11/26/2014, 08/06/2013 FINDINGS: VASCULAR Aorta: Diffuse atherosclerosis of the abdominal aorta with soft plaque and calcified plaque. No dissection. No aneurysm. No periaortic fluid. Celiac: Atherosclerotic changes at the origin of the celiac artery which is patent. Post stenotic dilation. Typical branch pattern of the celiac artery, with left gastric, common hepatic, and splenic artery identified. SMA: Atherosclerotic changes of the superior mesenteric artery, with mixed calcified and soft plaque at the origin. The SMA appears patent, with greater than 50% stenosis. Renals: Dense calcified and soft plaque at the origin the bilateral renal arteries. Renal arteries are patent. Single left and single right renal artery. IMA: Inferior mesenteric artery is patent, though appears stenotic at the origin. Left colic artery patent. Superior rectal artery patent. Right lower extremity: Dense calcified and soft plaque involving the right iliac system, including common iliac artery and external iliac artery. Small caliber vessels, with likely multifocal stenoses. Greatest degree of stenosis may be at the distal common iliac artery above the hypogastric origin. Hypogastric artery is patent. Common femoral artery patent. Proximal  SFA and profunda femoris patent. Left lower extremity: Mixed calcified and soft plaque of the left iliac system. Greatest degree of stenosis at the origin of the external iliac artery. Hypogastric artery patent. Common femoral artery patent. Proximal SFA and profunda femoris patent. Veins: Unremarkable appearance of the venous system. Review of the MIP images confirms the above findings. NON-VASCULAR Lower chest: Atelectatic changes  at the lung bases. Small hiatal hernia. Hepatobiliary: Morphologic changes of the liver compatible with cirrhosis with developing left hepatic enlargement. Nodular contour. No focal lesion identified on the current CT. No intrahepatic or extrahepatic biliary ductal dilatation. Cholecystectomy Pancreas: Unremarkable appearance of the pancreas. No pericholecystic fluid or inflammatory changes. Unremarkable ductal system. Spleen: Borderline splenomegaly. Adrenals/Urinary Tract: Unremarkable appearance of adrenal glands. Right: No hydronephrosis. Advanced cortical renal thinning. Symmetric perfusion to the left. Hypodense lesions within the cortex of the right kidney, the majority are too small to characterize though arm most likely benign cysts. Left: No hydronephrosis. Advanced cortical renal thinning come symmetric to the right. Symmetric perfusion. Cystic lesions in the cortex of the left kidney, most likely all benign cysts although the majority are too small to characterize. Unremarkable appearance of the urinary bladder with high density excreted contrast. Stomach/Bowel: Unremarkable appearance of the stomach. Small hiatal hernia. Unremarkable appearance of small bowel. Venous phase demonstrates enhancement of the small bowel and colon, although the colon somewhat limited by the presence of enteric contrast. Colonic diverticula. Lymphatic: Multiple lymph nodes in the para-aortic nodal station, none of which are enlarged. Mesenteric: Low-density free fluid within the abdomen, associated with peritoneal dialysis catheter. Small volume of free air in the anti dependent aspect. Reproductive: Unremarkable appearance of the prostate and pelvic organs. Other: Small fat containing umbilical hernia. Unremarkable appearance of the tract of the dialysis catheter. Musculoskeletal: No evidence of acute fracture. Developing sclerotic appearance of the skeletal structures, potentially early renal osteodystrophy. No bony canal  narrowing. No significant degenerative changes of the hips. IMPRESSION: VASCULAR No acute vascular abnormality identified. Advanced aortic atherosclerosis. There are associated changes at the origin of all mesenteric vessel, though they remain patent. There may be 50 percent stenosis at the celiac artery origin, with greater than 50 percent stenosis of the superior mesenteric artery origin, and potentially greater than 50 percent stenosis at the origin of the IMA, although degree of stenosis cannot be determined by CTA. This pattern of disease certainly places the patient at risk for chronic mesenteric ischemia. Correlation with patient's history may be useful, and potentially mesenteric duplex exam if there is concern for compromise. Bilateral renal artery stenosis, potentially contributing to advanced renal cortical thinning. NON-VASCULAR Morphologic changes of the liver compatible with cirrhosis. Referral for establishing gastroenterology care may be considered if not already established, as well as consideration of initiating surveillance for hepatocellular carcinoma. Peritoneal dialysis catheter with free fluid likely associated with treatment. Tiny amount of anti dependent gas within the abdomen, again likely associated with the peritoneal dialysis. Again, bilateral renal cortical cysts. While these are most likely benign, there is incomplete characterization given the size. Developing renal osteodystrophy. Signed, Dulcy Fanny. Earleen Newport, DO [facility] Electronically Signed   By: Corrie Mckusick D.O.   On: 04/01/2016 09:00    Microbiology: Recent Results (from the past 240 hour(s))  MRSA PCR Screening     Status: None   Collection Time: 03/22/2016 10:29 AM  Result Value Ref Range Status   MRSA by PCR NEGATIVE NEGATIVE Final    Comment:  The GeneXpert MRSA Assay (FDA approved for NASAL specimens only), is one component of a comprehensive MRSA  colonization surveillance program. It is not intended to diagnose MRSA infection nor to guide or monitor treatment for MRSA infections.   CSF culture     Status: None   Collection Time: 04/14/16 12:54 PM  Result Value Ref Range Status   Specimen Description CSF  Final   Special Requests TUBE 2  Final   Gram Stain   Final    WBC PRESENT, PREDOMINANTLY MONONUCLEAR NO ORGANISMS SEEN CYTOSPIN    Culture NO GROWTH 3 DAYS  Final   Report Status 04/18/2016 FINAL  Final  C difficile quick scan w PCR reflex     Status: None   Collection Time: 04/17/16  4:11 AM  Result Value Ref Range Status   C Diff antigen NEGATIVE NEGATIVE Final   C Diff toxin NEGATIVE NEGATIVE Final   C Diff interpretation No C. difficile detected.  Final     Labs: Basic Metabolic Panel:  Recent Labs Lab 04/13/16 0554 04/14/16 0403 04/16/16 1253 04/17/16 0530  NA 138 138 143 139  K 4.4 3.9 4.0 4.6  CL 102 100* 104 98*  CO2 18* 21* 23 25  GLUCOSE 135* 112* 66 73  BUN 40* 22* 36* 20  CREATININE 8.69* 5.30* 6.86* 4.46*  CALCIUM 8.5* 8.3* 8.7* 8.9  MG 2.0  --  2.5*  --   PHOS 4.7*  --  5.2*  --    Liver Function Tests:  Recent Labs Lab 04/13/16 0554 04/14/16 1209 04/16/16 1253 04/17/16 0530  AST 41 55* 47* 56*  ALT 32 35 33 35  ALKPHOS 79 92 84 90  BILITOT 1.2 0.8 1.0 1.3*  PROT 5.7* 5.8* 5.5* 5.9*  ALBUMIN 1.6* 1.8* 1.9* 2.0*    Recent Labs Lab 04/14/16 1209  LIPASE 51    Recent Labs Lab 04/12/16 1947  AMMONIA 20   CBC:  Recent Labs Lab 04/13/16 0554 04/14/16 0800 04/16/16 1253 04/17/16 0530  WBC 10.2 10.5 13.3* 13.5*  HGB 9.4* 10.1* 9.8* 10.5*  HCT 31.2* 34.3* 34.7* 36.8*  MCV 106.1* 106.9* 111.2* 109.5*  PLT 259 289 234 235   Cardiac Enzymes: No results for input(s): CKTOTAL, CKMB, CKMBINDEX, TROPONINI in the last 168 hours. D-Dimer No results for input(s): DDIMER in the last 72 hours. BNP: Invalid input(s): POCBNP CBG:  Recent Labs Lab 2016-05-07 1134  GLUCAP  171*   Anemia work up No results for input(s): VITAMINB12, FOLATE, FERRITIN, TIBC, IRON, RETICCTPCT in the last 72 hours. Urinalysis    Component Value Date/Time   COLORURINE YELLOW 08/07/2013 1855   APPEARANCEUR CLOUDY (A) 08/07/2013 1855   LABSPEC 1.020 08/07/2013 1855   PHURINE 7.5 08/07/2013 1855   GLUCOSEU 100 (A) 08/07/2013 1855   HGBUR MODERATE (A) 08/07/2013 1855   BILIRUBINUR SMALL (A) 08/07/2013 1855   KETONESUR NEGATIVE 08/07/2013 1855   PROTEINUR >300 (A) 08/07/2013 1855   UROBILINOGEN 1.0 08/07/2013 1855   NITRITE NEGATIVE 08/07/2013 1855   LEUKOCYTESUR TRACE (A) 08/07/2013 1855   Sepsis Labs Invalid input(s): PROCALCITONIN,  WBC,  LACTICIDVEN     SIGNED:  Vernell Leep, MD, FACP, FHM. [facility] Pager [phone]  If 7PM-7AM, please contact night-coverage [email or web address] Password [email or web address] 07-May-2016, 5:28 PM

## 2016-04-22 NOTE — Progress Notes (Signed)
Pt was intubated by CRNA, Positive color change on ETC02. Pt had good chest rise.

## 2016-04-22 NOTE — Progress Notes (Signed)
Chaplain responded to Code [employer]. Found pt's wife and son in consult room after the code had been called. Doctor and I entered together. After he consulted with them I provided emotional and spiritual support for which they expressed gratitude. We had prayer together. I gave them a card with the  Patient Placement number to call to provide funeral home name.

## 2016-04-22 NOTE — Code Documentation (Signed)
CODE BLUE NOTE  Patient Name: Victor Lane   MRN: [id number]   Date of Birth/ Sex: 06/30/1945 , male      Admission Date: 04/05/2016  Attending Provider: Modena Jansky, MD  Primary Diagnosis: Sepsis, due to unspecified organism [facility]) [A41.9]    Indication: Pt was in his usual state of health until this AM, when he was noted to be unresponsive and in PEA. Code blue was subsequently called. At the time of arrival on scene, ACLS protocol was underway.   Technical Description:  - CPR performance duration:  20 minutes  - Was defibrillation or cardioversion used? No   - Was external pacer placed? No  - Was patient intubated pre/post CPR? Yes    Medications Administered: Y = Yes; Blank = No Amiodarone    Atropine    Calcium  y  Epinephrine  y  Lidocaine    Magnesium    Norepinephrine    Phenylephrine    Sodium bicarbonate  y  Vasopressin    Other y    Post CPR evaluation:  - Final Status - Was patient successfully resuscitated ? No   Miscellaneous Information:  - Time of death:  11.57 AM  - Primary team notified?  Yes  - Family Notified? Yes      Lovenia Kim, MD   May 16, 2016, 3:49 PM

## 2016-04-22 NOTE — Progress Notes (Addendum)
0800 Pt is awake, disoriented. Loose stools noted at this time. Dr Algis Liming came in, seen pt and able to talk to pt's wife.  Placed pt on high back rest for breakfast, very poor appetite. Had 2 spoons of magic cup and was able to swallow nectar thick water w/o difficulty. [name] is present and trying to feed pt. 1130 I was called to the room by the speech therapist, saying pt is just staring blankly. When I came, Pt was unresponsive, labored breathing noted.  V/S checked, breathing stopped, no pulse, Called for code team. CPR started. Code team came in.  Dr Algis Liming came and present during the code, talking and updating pt's wife.  0 vital signs after cont CPR. Pronounced by Dr Algis Liming at 1157. [facility] notified.  Post mortem care completed. [name] and son present, emotional support given. Chaplain came in. Post mortem checklist completed. 1530 Body picked up by Peter Garter and [employer] funeral home [facility]).

## 2016-04-22 NOTE — Progress Notes (Signed)
[facility] PROGRESS NOTE  Victor Lane [id number] DOB: August 17, 1945 DOA: 04/04/2016   PCP: Beatris Si     Assessment/Plan: Principal Problem:   Peritonitis ([place]) Active Problems:   HTN (hypertension)   Anemia of chronic disease   Continuous ambulatory peritoneal dialysis status ([place])   ESRD on dialysis ([place])   Dilated aortic root (HCC)   CAD (coronary artery disease), native coronary artery   Dyslipidemia   Chronic systolic congestive heart failure (HCC)   Carotid stenosis   Abdominal pain   Diabetes mellitus with complication (HCC)   Hypokalemia   Acute encephalopathy   Acute abdomen   Back pain   Palliative care encounter     70 y.o. 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  He presents to ED sent by renal MD for PD cath removal for refractory culture negative peritonitis. Per chart last ECHO shows EF 30%. Hypotensive on arrival. Possible small left lower lobe infiltrate. Continues to complain of abdominal pain.Also noted to have dysphagia due to hx of radiation to upper airway(vocal cord cancer ).MBS ,now on modified diet . Hospitalization prolonged due to encephalopathy   Assessment and plan Peritonitis ([place])- Fungal/left lower lobe pneumonia-Treated / sepsis- resolved:  his ascites fluid cultures from 10/11 was negative, but has high cell count in fluid, indicating persistent or recurrent peritonitis. Pt was suspected to have sepsis on presentation with elevated lactate, tachycardia, tachypnea and hypotension. Blood pressure responded to IV fluid resuscitation,   Nephrology following Status pos removal of PD cath by general surgery 10/20   Hemodynamics have improved with confusion has not Completed Vancomycin & Zosyn on 10/21 and 10/22 respectively. Currently on  oral Diflucan [outpatient culture suspicious for  Yeast , identification pending] Duration of treatment-may need infectious disease consult. As discussed with Dr. Jimmy Footman- 3-4 weeks total. Blood culture from 10/11 & 10/19 negative, culture from peritoneal washings 10/11, 10/8  Negative - As per patient's RN report and patient's spouse, patient has been restless all night due to unclear reasons. Unable to objectively tell if it's due to pain. As per spouse, also noted to be hallucinating. Diarrhea apparently gets worse when he takes Nepro which was changed to boost by dietitian on 10/28. - Discussed with Dr. Jimmy Footman: If patient continues to complain of abdominal pain then may consider reimaging i.e. CT abdomen with oral contrast only early next week.    Nonsustained VT Patient has a very low EF, 30-35%, repeat 2-D echo shows EF is 20-25% Patient is already on Diflucan, should not add any other medication to prolong QTC Haldol discontinued Consider repeating 2-D echo in a month or 2.? Consideration for ICD if persistent low EF.  Abdominal pain Severe abdominal pain with HD 10/25 ,   CT 10/24 Spleen shows  9 mm somewhat wedge-shaped peripheral opacity,question small infarct. 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 Jobie Quaker ceftazidime 2g and follow up 04/06/16 with home teaching at dialysis center  given findings of a small infarct in the spleen , repeat 2-D echo  EF: 20% -   25%,Akinesis of the  apicalanteroseptal and apical myocardium , probably will need TEE to r/o embolic source , to risky for him to undergo sedation    Acute metabolic encephalopathy:  continues to have intermittent confusion ? Etiology Continue to treat underlying infection MRI brain 10/23: No acute finding, including infarct. Generalized atrophy and mild chronic microvascular ischemic change. Ammonia level within normal limits LP negative so far ,  CSF fluid cx 10/24: Negative  Unclear etiology. Extensively worked up. As per spouse, mental status was slightly better over the last 1 or 2 days until last night and since then seems to have worsened with hallucinations. Reviewed most recent EKG and QTC 459 ms. After discussing risks and benefits, started Risperdal 0.25 MG by mouth twice a day when necessary which the spouse was agreeable to. If this doesn't help then consider psychiatry consultation.   ESRD- was on PD: Now hemodialysis per Dr. Jonnie Finner,   Transitioned PD to HD,AVF functional L arm. HD as per nephrology recommendations  clipped to [facility]   Hypokalemia:  , resolved Repleted   CAD: s/p of stent. No CP -continue aspirin per rectal, IV metoprolol  HTN: -hold oral meds -IV hydralazine when necessary and IV metoprolol with holding parameters.  Chronic systolic congestive heart failure:  . Patient does not have leg edema or JVD. CHF seems to be compensated on admission. Pt is not on diuretics at home. Volume status being managed with hemodialysis Repeat echo results as above    DM-II: Last A1c 5.5, well controled. Patient is taking glipizide at home CBGs have remained stable, DC Accu-Cheks   Back  pain-patient's wife states that patient had a recent fall Plain films show spondylosis of the lumbar spine, no fracture   Dysphagia-patient deemed to be high risk for aspiration,now on Dysphagia 1 (puree);Nectar-thick liquid patient has had difficulty swallowing medications,MBS swallow pending, patient is confused to participate. Previous history of radiation therapy to vocal cords  Diarrhea ? Related to Nepro which was stopped on d/t peritonitis. Recent C. difficile testing negative. Monitor.      DVT prophylaxsis heparin  Code Status:  Full code    Family Communication: Discussed extensively with patient's spouse at bedside in the presence of patient's nurses and nephrologist. Updated care and answered all questions. Advised her that patient is quite sick which she stated that she was aware of. Disposition   remains inpatient, palliative care consult for goals of care given multiple comorbidities and no signs of improvement, they want full scope of treatment      Consultants:  General surgery  Nephrology  Procedures:  HD  Antibiotics: Anti-infectives    Start     Dose/Rate Route Frequency Ordered Stop   04/16/16 2200  fluconazole (DIFLUCAN) tablet 200 mg     200 mg Oral Daily at bedtime 04/16/16 1439     04/14/16 1200  vancomycin (VANCOCIN) IVPB 1000 mg/200 mL premix  Status:  Discontinued     1,000 mg 200 mL/hr over 60 Minutes Intravenous Every T-Th-Sa (Hemodialysis) 04/11/16 1447 04/12/16 1259  04/11/16 1800  vancomycin (VANCOCIN) IVPB 1000 mg/200 mL premix     1,000 mg 200 mL/hr over 60 Minutes Intravenous  Once 04/11/16 1447 04/11/16 2022   04/11/16 1330  piperacillin-tazobactam (ZOSYN) IVPB 3.375 g  Status:  Discontinued     3.375 g 12.5 mL/hr over 240 Minutes Intravenous Every 12 hours 04/11/16 1315 04/12/16 1259   04/11/16 1000  piperacillin-tazobactam (ZOSYN) IVPB 3.375 g  Status:  Discontinued     3.375 g 12.5 mL/hr over 240 Minutes Intravenous Every 12  hours 04/11/16 0353 04/11/16 1315   04/10/2016 2300  fluconazole (DIFLUCAN) IVPB 200 mg  Status:  Discontinued     200 mg 100 mL/hr over 60 Minutes Intravenous Every 24 hours 04/08/2016 2232 04/16/16 1439   04/05/2016 2200  piperacillin-tazobactam (ZOSYN) IVPB 2.25 g  Status:  Discontinued     2.25 g 100 mL/hr over 30 Minutes Intravenous Every 6 hours 03/30/2016 1622 04/11/16 0353   03/25/2016 1615  piperacillin-tazobactam (ZOSYN) IVPB 3.375 g     3.375 g 100 mL/hr over 30 Minutes Intravenous  Once 04/12/2016 1609 03/22/2016 1756   03/26/2016 1615  vancomycin (VANCOCIN) 1,500 mg in sodium chloride 0.9 % 500 mL IVPB     1,500 mg 250 mL/hr over 120 Minutes Intravenous  Once 04/03/2016 1609 04/12/2016 2140          HPI/Subjective: History as documented above. Patient unable to provide any history. Same was obtained from nurses and spouse at bedside. Multiple episodes of diarrhea since last night, worsening mental status and hallucinations.  patient was seen early this morning. Subsequently he went into cardiac arrest and demised. Please see death/discharge summary  Objective: Vitals:   04/18/16 1030 04/18/16 1155 04/18/16 1504 04/18/16 2030  BP: (!) 90/50 120/70 (!) 152/59 123/62  Pulse: (!) 104 (!) 104 (!) 113 (!) 109  Resp: 18 18 18  (!) 22  Temp:  98 F (36.7 C)  97.4 F (36.3 C)  TempSrc:  Oral    SpO2:  100% 100% 100%  Weight:  74 kg (163 lb 2.3 oz)    Height:        Intake/Output Summary (Last 24 hours) at May 01, 2016 0748 Last data filed at 04/18/16 1155  Gross per 24 hour  Intake                0 ml  Output                0 ml  Net                0 ml    Exam:  Examination:  General exam: Confused and restless. Respiratory system: Clear to auscultation. Respiratory effort normal. Cardiovascular system: S1 & S2 heard, RRR. No JVD, murmurs, rubs, gallops or clicks. No pedal edema.  Gastrointestinal system: Abdomen is nondistended, soft and mild patchy areas of tenderness without  rigidity, guarding or rebound. No organomegaly or masses felt. Normal bowel sounds heard. Central nervous system:  Alert but not oriented and does not follow instructions. No focal neurological deficits. Extremities: Symmetric 5 x 5 power-actively moving all limbs . Skin: No rashes, lesions or ulcers Psychiatry: Judgement and insight appear impaired. Mood & affect appropriate.     Data Reviewed: I have personally reviewed following labs and imaging studies  Micro Results Recent Results (from the past 240 hour(s))  C difficile quick scan w PCR reflex     Status: None   Collection Time: 03/25/2016  4:07 PM  Result Value  Ref Range Status   C Diff antigen NEGATIVE NEGATIVE Final   C Diff toxin NEGATIVE NEGATIVE Final   C Diff interpretation No C. difficile detected.  Final  Gastrointestinal Panel by PCR , Stool     Status: None   Collection Time: 04/02/2016  4:07 PM  Result Value Ref Range Status   Campylobacter species NOT DETECTED NOT DETECTED Final   Plesimonas shigelloides NOT DETECTED NOT DETECTED Final   Salmonella species NOT DETECTED NOT DETECTED Final   Yersinia enterocolitica NOT DETECTED NOT DETECTED Final   Vibrio species NOT DETECTED NOT DETECTED Final   Vibrio cholerae NOT DETECTED NOT DETECTED Final   Enteroaggregative E coli (EAEC) NOT DETECTED NOT DETECTED Final   Enteropathogenic E coli (EPEC) NOT DETECTED NOT DETECTED Final   Enterotoxigenic E coli (ETEC) NOT DETECTED NOT DETECTED Final   Shiga like toxin producing E coli (STEC) NOT DETECTED NOT DETECTED Final   Shigella/Enteroinvasive E coli (EIEC) NOT DETECTED NOT DETECTED Final   Cryptosporidium NOT DETECTED NOT DETECTED Final   Cyclospora cayetanensis NOT DETECTED NOT DETECTED Final   Entamoeba histolytica NOT DETECTED NOT DETECTED Final   Giardia lamblia NOT DETECTED NOT DETECTED Final   Adenovirus F40/41 NOT DETECTED NOT DETECTED Final   Astrovirus NOT DETECTED NOT DETECTED Final   Norovirus GI/GII NOT DETECTED  NOT DETECTED Final   Rotavirus A NOT DETECTED NOT DETECTED Final   Sapovirus (I, II, IV, and V) NOT DETECTED NOT DETECTED Final  Blood Culture (routine x 2)     Status: None   Collection Time: 03/26/2016  4:17 PM  Result Value Ref Range Status   Specimen Description BLOOD RIGHT HAND  Final   Special Requests BOTTLES DRAWN AEROBIC AND ANAEROBIC 5CC  Final   Culture NO GROWTH 5 DAYS  Final   Report Status 04/14/2016 FINAL  Final  Blood Culture (routine x 2)     Status: None   Collection Time: 04/04/2016  4:23 PM  Result Value Ref Range Status   Specimen Description BLOOD LEFT ANTECUBITAL  Final   Special Requests BOTTLES DRAWN AEROBIC AND ANAEROBIC 5CC  Final   Culture NO GROWTH 5 DAYS  Final   Report Status 04/14/2016 FINAL  Final  MRSA PCR Screening     Status: None   Collection Time: 03/26/2016 10:29 AM  Result Value Ref Range Status   MRSA by PCR NEGATIVE NEGATIVE Final    Comment:        The GeneXpert MRSA Assay (FDA approved for NASAL specimens only), is one component of a comprehensive MRSA colonization surveillance program. It is not intended to diagnose MRSA infection nor to guide or monitor treatment for MRSA infections.   CSF culture     Status: None   Collection Time: 04/14/16 12:54 PM  Result Value Ref Range Status   Specimen Description CSF  Final   Special Requests TUBE 2  Final   Gram Stain   Final    WBC PRESENT, PREDOMINANTLY MONONUCLEAR NO ORGANISMS SEEN CYTOSPIN    Culture NO GROWTH 3 DAYS  Final   Report Status 04/18/2016 FINAL  Final  C difficile quick scan w PCR reflex     Status: None   Collection Time: 04/17/16  4:11 AM  Result Value Ref Range Status   C Diff antigen NEGATIVE NEGATIVE Final   C Diff toxin NEGATIVE NEGATIVE Final   C Diff interpretation No C. difficile detected.  Final    Radiology Reports Dg Chest 1 View  Result  Date: 04/13/2016 CLINICAL DATA:  Sharp chest pain EXAM: CHEST 1 VIEW COMPARISON:  04/11/2016 FINDINGS: Cardiomegaly  and congested vascular appearance. Trace bilateral pleural fluid or thickening. Extensive atherosclerosis of the aorta. Multiple stents of the left axilla. IMPRESSION: Cardiomegaly and pulmonary venous congestion. Electronically Signed   By: Monte Fantasia M.D.   On: 04/13/2016 10:05   Dg Chest 2 View  Result Date: 04/01/2016 CLINICAL DATA:  Suspected sepsis.  Peritoneal dialysis patient. EXAM: CHEST  2 VIEW COMPARISON:  Chest radiograph 07/19/2015.  Abdominal CT 03/28/2016 FINDINGS: Stable cardiomegaly and mediastinal contours. There is thoracic aortic atherosclerosis. Small bilateral pleural effusions and bibasilar airspace disease. No pulmonary edema. No pneumothorax. Vascular stent in the left axilla. Free air under the right hemidiaphragm, likely related to peritoneal dialysis. Resorption of the distal right clavicle. IMPRESSION: Chronic cardiomegaly, stable.  Thoracic aortic atherosclerosis. Small bilateral pleural effusions with bibasilar airspace disease, likely atelectasis. Electronically Signed   By: Jeb Levering M.D.   On: 04/01/2016 05:40   Dg Thoracic Spine 2 View  Result Date: 04/12/2016 CLINICAL DATA:  70 year old male with history of back pain and weight loss. EXAM: THORACIC SPINE 2 VIEWS COMPARISON:  No priors. FINDINGS: There is no evidence of thoracic spine fracture. Alignment is normal. Mild multilevel degenerative disc disease, typical for the patient's age. No other significant bone abnormalities are identified. Aortic atherosclerosis. Vascular stent projecting over the left axillary region, incompletely visualized. IMPRESSION: 1. No acute radiographic abnormality of the thoracic spine. 2. Aortic atherosclerosis. Electronically Signed   By: Vinnie Langton M.D.   On: 04/12/2016 15:19   Dg Lumbar Spine 2-3 Views  Result Date: 04/12/2016 CLINICAL DATA:  Back pain lumbar region. EXAM: LUMBAR SPINE - 2-3 VIEW COMPARISON:  04/04/2013 FINDINGS: Vertebral body alignment, heights  and disc space heights are normal. There is mild spondylosis throughout the lumbar spine to include facet arthropathy over the lower lumbar spine. No compression fracture or subluxation. Moderate calcified plaque over the abdominal aorta and iliac arteries IMPRESSION: Mild spondylosis of the lumbar spine to include facet arthropathy. Electronically Signed   By: Marin Olp M.D.   On: 04/12/2016 15:25   Dg Pelvis 1-2 Views  Result Date: 04/12/2016 CLINICAL DATA:  Low back pain. Unable to obtain history from patient. EXAM: PELVIS - 1-2 VIEW COMPARISON:  None. FINDINGS: Mild symmetric degenerative change of the hips. Mild diffuse decreased bone mineralization is present. There is no acute fracture or dislocation. Skin staples are present over the soft tissues of the left hip region. Several pelvic phleboliths. Calcified plaque over the iliac and femoral arteries. IMPRESSION: No acute findings. Electronically Signed   By: Marin Olp M.D.   On: 04/12/2016 15:26   Mr Brain Wo Contrast  Result Date: 04/13/2016 CLINICAL DATA:  Altered mental status. EXAM: MRI HEAD WITHOUT CONTRAST TECHNIQUE: Multiplanar, multiecho pulse sequences of the brain and surrounding structures were obtained without intravenous contrast. COMPARISON:  None FINDINGS: Brain: Motion degraded exam. Only sagittal T1 weighted imaging, axial diffusion, axial T2, and axial FLAIR sequences could be obtained before patient refused to continue. No acute infarct, hemorrhage, hydrocephalus, or swelling. Small-vessel ischemic change in the deep cerebral white matter, mild for age. Remote lacunar infarct in the left caudate. Mild generalized cerebral volume loss. Vascular: Normal flow voids. Skull and upper cervical spine: Normal marrow signal. Sinuses/Orbits: Chronic sinusitis including complete opacification of the right maxillary antrum with inspissated secretions. Mild mucosal thickening in the mastoids. IMPRESSION: 1. No acute finding, including  infarct. 2.  Generalized atrophy and mild chronic microvascular ischemic change. 3. Partial, motion degraded exam. Electronically Signed   By: Monte Fantasia M.D.   On: 04/13/2016 10:41   Dg Chest Left Decubitus  Result Date: 04/13/2016 CLINICAL DATA:  Sharp chest pain. EXAM: CHEST - LEFT DECUBITUS COMPARISON:  Chest x-ray from earlier today FINDINGS: Trace bilateral pleural fluid or thickening. No significant layering fluid on left side down decubitus. No right-sided pneumothorax is noted. Cardiomegaly and vascular congestion. IMPRESSION: Left side down decubitus shows no significant layering pleural fluid or right-sided pneumothorax. Electronically Signed   By: Monte Fantasia M.D.   On: 04/13/2016 10:04   Ct Abdomen Pelvis W Contrast  Result Date: 04/14/2016 CLINICAL DATA:  Patient is poor historian. History of ascites. Sepsis on present a shin. Tachycardia, tachypnea and hypotension. EXAM: CT ABDOMEN AND PELVIS WITH CONTRAST TECHNIQUE: Multidetector CT imaging of the abdomen and pelvis was performed using the standard protocol following bolus administration of intravenous contrast. CONTRAST:  130mL ISOVUE-300 IOPAMIDOL (ISOVUE-300) INJECTION 61% COMPARISON:  04/02/2016, 04/01/2016, 11/26/2014, 08/06/2013 FINDINGS: Lower chest: Stable appearance of mild right middle lobe subpleural consolidation and fibrosis. Moderate emphysematous disease involving the inferior aspects of the upper lobes and the bilateral lower lobes. Persistent oblong areas of masslike consolidation along the left lower lobe posterior pleural surface and the pleural surface of the lingula laterally. These are similar in appearance compared with 04/02/2016, slightly decreased from 11/26/2014. Dense atherosclerotic vascular disease of the imaged portions of the aorta. Mildly ectatic ascending aorta, measuring up to 3.9 cm, unchanged. Marked coronary artery calcification. Heart size is slightly enlarged. No large pericardial effusion.  Mitral valvular calcifications are also noted. Small amount of left greater than right gynecomastia. Hepatobiliary: Nodular contour of the liver, consistent with cirrhosis. No gross focal hepatic abnormalities. Surgical clips in the gallbladder fossa consistent with prior cholecystectomy. No biliary dilatation. Pancreas: Unremarkable. No pancreatic ductal dilatation or surrounding inflammatory changes. Marked vascular calcifications. Spleen: Borderline enlargement of the spleen measuring 12 cm, unchanged. Now seen is a tiny slightly wedge-shaped relatively peripheral hypodensity in the posterior spleen measuring 9 mm. Normal splenic vein and portal vein enhancement. Adrenals/Urinary Tract: Mild nodular thickening of the left adrenal gland is unchanged. Kidneys are atrophic. No hydronephrosis. Stable hypodense lesion in the mid cortex of right kidney. Stable dominant cyst in the midpole of left kidney. Additional hypodense lesions in the left kidney as before. Stomach/Bowel: Stomach contains fluid. There is no dilated small bowel to suggest an obstruction. There is mild thickening of small bowel loops in the left hemi abdomen, series 2, image number 56. This does not appear significantly changed. No definite intramural air present within the thickened small bowel loop. There is diverticular disease of the sigmoid colon. Minimal wall thickening. Vascular/Lymphatic: Dense vascular calcifications within the aorta. No aneurysm. No pathologically enlarged mesenteric or pelvic nodes. Reproductive: Prostate is unremarkable. Other: Small amount of abdominal and pelvic ascites, decreased compared to the previous exam. The peritoneal pelvic dialysis catheter has been removed. No free air. Musculoskeletal: Degenerative changes of the spine. No acute osseous abnormality. IMPRESSION: 1. Moderate emphysematous disease at the bilateral lung bases with persistent ovoid areas of consolidation at the pleural surface of the lingula and  left lower lobe posteriorly, findings were present dating back to 11/26/2014, query chronic consolidation/atelectasis with pleural based mass(es) also remaining in the differential. 2. Liver cirrhosis. Borderline enlarged spleen. Spleen now demonstrates a 9 mm somewhat wedge-shaped peripheral opacity, question small infarct. Patent splenic and portal veins. 3.  Removal of pelvic peritoneal dialysis catheter. There is decreased abdominal and pelvic ascites. 4. Persistent thickened fluid-filled small bowel loops in the left hemi abdomen, possible inflammatory change. No definite intramural air or portal venous gas is present. 5. Atrophic kidney with bilateral hypodense lesions probable cyst as before. 6. Sigmoid diverticulosis with slight wall thickening of the sigmoid colon. Electronically Signed   By: Donavan Foil M.D.   On: 04/14/2016 18:20   Ct Abdomen Pelvis W Contrast  Result Date: 03/29/2016 CLINICAL DATA:  Acute onset of generalized abdominal pain. Recent peritoneal dialysis. Vomiting and diarrhea. Initial encounter. EXAM: CT ABDOMEN AND PELVIS WITH CONTRAST TECHNIQUE: Multidetector CT imaging of the abdomen and pelvis was performed using the standard protocol following bolus administration of intravenous contrast. CONTRAST:  1108mL ISOVUE-300 IOPAMIDOL (ISOVUE-300) INJECTION 61% COMPARISON:  CT of the abdomen and pelvis from 11/26/2014 FINDINGS: Lower chest: Trace right-sided pleural fluid is noted. Bibasilar atelectasis or scarring is seen. The visualized portions of the mediastinum are unremarkable. Hepatobiliary: A small amount of ascites is seen tracking about the liver. The liver is unremarkable in appearance. The patient is status post cholecystectomy, with clips noted at the gallbladder fossa. The common bile duct remains normal in caliber. Pancreas: The pancreas is within normal limits. Spleen: The spleen is unremarkable in appearance. Adrenals/Urinary Tract: The adrenal glands are grossly  unremarkable in appearance. Severe chronic bilateral renal atrophy is noted, with scattered bilateral renal cysts. There is no evidence of hydronephrosis. No renal or ureteral stones are identified. No significant perinephric stranding is seen. Stomach/Bowel: The stomach is unremarkable in appearance. The small bowel is within normal limits. The appendix is normal in caliber, without evidence of appendicitis. Scattered diverticulosis is noted along the distal descending and sigmoid colon, without definite evidence of diverticulitis. Vascular/Lymphatic: Diffuse calcification is seen along the abdominal aorta and its branches. There is relatively severe luminal narrowing at the proximal right common iliac artery, and moderate luminal narrowing along the left common iliac artery. Relatively severe luminal narrowing is also noted along the proximal left superficial femoral artery. No retroperitoneal or pelvic sidewall lymphadenopathy is seen. Reproductive: The bladder is mildly distended and grossly unremarkable. The prostate remains normal in size. Other: The patient's peritoneal dialysis catheter is noted within the pelvis, with a small to moderate amount of free fluid in the pelvis and along both paracolic gutters. Musculoskeletal: No acute osseous abnormalities are identified. The visualized musculature is unremarkable in appearance. IMPRESSION: 1. No acute abnormality seen to explain the patient's symptoms. 2. Status post recent peritoneal dialysis. Small to moderate volume ascites noted within the abdomen and pelvis. 3. Diffuse aortic atherosclerosis noted. Relatively severe luminal narrowing at the proximal right common iliac artery, and moderate luminal narrowing along the left common iliac artery. Relatively severe luminal narrowing also noted along the proximal left superficial femoral artery. 4. Severe chronic bilateral renal atrophy, with scattered bilateral renal cysts. 5. Scattered diverticulosis along the  distal descending and sigmoid colon, without definite evidence of diverticulitis. 6. Trace right-sided pleural fluid. Bibasilar atelectasis or scarring noted. Electronically Signed   By: Garald Balding M.D.   On: 03/29/2016 00:10   Dg Chest Port 1 View  Result Date: 04/18/2016 CLINICAL DATA:  Sepsis.  History of head and neck cancer. EXAM: PORTABLE CHEST 1 VIEW COMPARISON:  Chest radiograph April 01, 2016 FINDINGS: Cardiac silhouette is mildly enlarged unchanged. Heavily calcified aortic. Diffuse interstitial prominence. Worsening retrocardiac consolidation and small LEFT pleural effusion. RIGHT lung base strandy densities. No pneumothorax.  Surgical clips LEFT neck, stent projects in RIGHT neck and LEFT subclavian vessels. Postsurgical changes RIGHT distal clavicle. Bibasilar strandy densities IMPRESSION: Stable cardiomegaly. Worsening retrocardiac consolidation with small LEFT pleural effusion. RIGHT lung base atelectasis/ scarring. Worsening bronchitic changes. Electronically Signed   By: Elon Alas M.D.   On: 03/27/2016 19:40   Ct Angio Abdomen Pelvis  W &/or Wo Contrast  Result Date: 04/03/2016 CLINICAL DATA:  70 year old male with abdominal pain. Negative lactic acidosis. Renal failure with peritoneal dialysis catheter. EXAM: CT ANGIOGRAPHY  ABDOMEN AND PELVIS TECHNIQUE: Multidetector CT imaging through the abdomen and pelvis was performed using the standard protocol during bolus administration of intravenous contrast. Delayed images included. Multiplanar reconstructed images including MIPs were obtained and reviewed to evaluate the vascular anatomy. CONTRAST:  100 cc Isovue 370 COMPARISON:  CT angiogram done the previous day, CT 03/28/2016, CT 11/26/2014 FINDINGS: VASCULAR Aorta: Extensive mixed calcified and soft plaque of the lower thoracic and abdominal aorta. No aneurysm. No dissection. No periaortic fluid. Celiac: Similar appearance of atherosclerotic changes at the origin of the celiac  artery, which is patent though perhaps with 50% or greater stenosis. Post stenotic dilation. Typical branch pattern again noted, discussed on the prior CT. SMA: Similar stenotic appearance of the superior mesenteric artery with mixed calcified and soft plaque. Greater than 50% stenosis. Distal branches appear patent. Renals: Calcified and soft plaque at the origin the bilateral renal arteries. Single bilateral renal arteries. IMA: Patent IMA. Likely stenosis at the origin. Difficult to quantify degree of stenosis. Right lower extremity: Mixed calcified and soft plaque of the right iliac system. Again, likely greater than 50% stenosis in the proximal right common iliac artery. Hypogastric artery and external iliac artery patent. Atherosclerotic disease throughout the external iliac artery. Common femoral artery in the proximal full more all vessels patent. Left lower extremity: Mixed calcified and soft plaque of the left iliac system. Likely stenosis at the distal common iliac artery. Hypogastric artery and external iliac artery patent. Disease through the length of the external iliac artery. Common femoral artery in the proximal fill more 0 vasculature patent on today's exam. Veins: Portal venous system remains patent. Unremarkable appearance of the systemic venous vasculature. Review of the MIP images confirms the above findings. NON-VASCULAR Lower chest: Respiratory motion somewhat limits evaluation of the lung bases. Coverage of the lower chest/ field-of-view larger on the current study. Plaque like thickening of the left sided pleura at the lung base. This has been present since the CT 11/26/2014 Coronary calcifications.  Aortic valve calcifications. Hepatobiliary: Morphology of the liver compatible with cirrhosis with enlarged left lobe, caudate lobe, and nodular configuration. No focal lesion identified. Pancreas: Unremarkable appearance of pancreas Spleen: Borderline enlarged spleen Adrenals/Urinary Tract:  Unremarkable appearance of adrenal glands. Right: Severe renal cortical thinning. No hydronephrosis or nephrolithiasis. Hypodense lesion on the lateral cortex and at the superior cortex too small to characterize. Left: Severe renal cortical thinning. No hydronephrosis. Similar appearance of low-density cystic structures, the larger of which are compatible with simple cysts. Smaller are too small to characterize. Excreted contrast within the urinary bladder. Stomach/Bowel: Unremarkable stomach. Small hiatal hernia. No evidence of small bowel obstruction or transition point. Minimal thickening of small bowel wall, persists after the prior CT. Small bowel wall enhances on the arterial and venous phase, with no CT evidence of ischemia. Retained oral contrast within colon, limiting evaluation of the mucosal surface. No evidence of obstruction. Small stool burden. Diverticular disease. Normal appendix. Lymphatic: Low-density ascites within the  pericolic gutter, right upper quadrant, and within the dependent pelvis associated with the peritoneal dialysis catheter. No enhancement of the surface is the peritoneum and. Vascular/lymphatic congestion within small bowel mesenteric, similar to prior. Small lymph nodes, none of which are enlarged. Mesenteric: Fluid throughout the peritoneal of small volume. Small amount of anti dependent gas, similar to prior. Peritoneal dialysis catheter terminates within the low pelvis, adjacent to the rectum. Reproductive: Unremarkable appearance of the pelvic organs. Other: Small fat containing umbilical hernia. Musculoskeletal: Sclerotic appearance of the skeletal elements. No acute bony abnormality. IMPRESSION: Vascular: No acute vascular abnormality. Similar to yesterday's study there is diffuse atherosclerotic changes of the abdominal aorta, with involvement of the mesenteric vasculature. Again, there is likely greater than 50% stenosis at the celiac artery and IMA artery, however, CTA  resolution difficult to make precise assessment. The degree of stenosis at the SMA origin is likely high-grade given the post stenotic dilation and degree of disease. Distal branches of the mesenteric vessels remain patent. Bilateral renal artery stenosis. Portal venous system unremarkable. Systemic venous system unremarkable. Nonvascular: Morphology of the liver compatible with cirrhosis. Associated borderline splenomegaly, indicating developing portal hypertension. No evidence of bowel ischemia, with uniform enhancement of stomach and small bowel. Although the colon wall cannot be assessed with the presence of positive enteric contrast, there are no focal findings to suggest colonic ischemia. Temporal resolution of mild small bowel thickening is improved with 24 hour follow-up CT, with mild thickening of the small bowel in the mid abdomen. There is associated lymphatic congestion of the small bowel mesenteric with associated small lymph nodes. Finding is nonspecific. There is no evidence of venous obstruction per se, although these findings can be seen with portal colopathy/enteropathy in the setting of cirrhosis and developing portal hypertension. Alternative consideration would be an nonspecific inflammatory or infectious enteritis. These results were called by telephone at the time of interpretation on 04/03/2016 at 8:56 am to Dr. Carles Collet, who verbally acknowledged these results. Small volume of low-density ascites, likely related to PD catheter. PD catheter within the dependent pelvis adjacent to rectum. No evidence of peritonitis on the contrast-enhanced exam. Persistent thickening of the left posterior lower pleura, unchanged from the CT 11/26/2014. This may reflect rounded atelectasis/scarring. Further evaluation with chest CT may be considered if the patient has a history of carcinoma. Coronary artery disease.  Aortic valve calcifications. Signed, Dulcy Fanny. Earleen Newport, DO [facility] Radiology Electronically Signed   By: Corrie Mckusick D.O.   On: 04/03/2016 08:59   Ct Angio Abd/pel W And/or Wo Contrast  Result Date: 04/01/2016 CLINICAL DATA:  70 year old male with a history of lower abdominal pain and nausea vomiting. Constipation. EXAM: CTA ABDOMEN AND PELVIS WITH CONTRAST TECHNIQUE: Multidetector CT imaging of the abdomen and pelvis was performed using the standard protocol during bolus administration of intravenous contrast. Multiplanar reconstructed images and MIPs were obtained and reviewed to evaluate the vascular anatomy. CONTRAST:  75 cc Isovue 370 COMPARISON:  CT 03/28/2016, 11/26/2014, 08/06/2013 FINDINGS: VASCULAR Aorta: Diffuse atherosclerosis of the abdominal aorta with soft plaque and calcified plaque. No dissection. No aneurysm. No periaortic fluid. Celiac: Atherosclerotic changes at the origin of the celiac artery which is patent. Post stenotic dilation. Typical branch pattern of the celiac artery, with left gastric, common hepatic, and splenic artery identified. SMA: Atherosclerotic changes of the superior mesenteric artery, with mixed calcified and soft plaque at the origin. The SMA appears patent, with greater than 50% stenosis. Renals: Dense calcified  and soft plaque at the origin the bilateral renal arteries. Renal arteries are patent. Single left and single right renal artery. IMA: Inferior mesenteric artery is patent, though appears stenotic at the origin. Left colic artery patent. Superior rectal artery patent. Right lower extremity: Dense calcified and soft plaque involving the right iliac system, including common iliac artery and external iliac artery. Small caliber vessels, with likely multifocal stenoses. Greatest degree of stenosis may be at the distal common iliac artery above the hypogastric origin. Hypogastric artery is patent. Common femoral artery patent. Proximal SFA and profunda femoris patent. Left lower extremity: Mixed calcified  and soft plaque of the left iliac system. Greatest degree of stenosis at the origin of the external iliac artery. Hypogastric artery patent. Common femoral artery patent. Proximal SFA and profunda femoris patent. Veins: Unremarkable appearance of the venous system. Review of the MIP images confirms the above findings. NON-VASCULAR Lower chest: Atelectatic changes at the lung bases. Small hiatal hernia. Hepatobiliary: Morphologic changes of the liver compatible with cirrhosis with developing left hepatic enlargement. Nodular contour. No focal lesion identified on the current CT. No intrahepatic or extrahepatic biliary ductal dilatation. Cholecystectomy Pancreas: Unremarkable appearance of the pancreas. No pericholecystic fluid or inflammatory changes. Unremarkable ductal system. Spleen: Borderline splenomegaly. Adrenals/Urinary Tract: Unremarkable appearance of adrenal glands. Right: No hydronephrosis. Advanced cortical renal thinning. Symmetric perfusion to the left. Hypodense lesions within the cortex of the right kidney, the majority are too small to characterize though arm most likely benign cysts. Left: No hydronephrosis. Advanced cortical renal thinning come symmetric to the right. Symmetric perfusion. Cystic lesions in the cortex of the left kidney, most likely all benign cysts although the majority are too small to characterize. Unremarkable appearance of the urinary bladder with high density excreted contrast. Stomach/Bowel: Unremarkable appearance of the stomach. Small hiatal hernia. Unremarkable appearance of small bowel. Venous phase demonstrates enhancement of the small bowel and colon, although the colon somewhat limited by the presence of enteric contrast. Colonic diverticula. Lymphatic: Multiple lymph nodes in the para-aortic nodal station, none of which are enlarged. Mesenteric: Low-density free fluid within the abdomen, associated with peritoneal dialysis catheter. Small volume of free air in the  anti dependent aspect. Reproductive: Unremarkable appearance of the prostate and pelvic organs. Other: Small fat containing umbilical hernia. Unremarkable appearance of the tract of the dialysis catheter. Musculoskeletal: No evidence of acute fracture. Developing sclerotic appearance of the skeletal structures, potentially early renal osteodystrophy. No bony canal narrowing. No significant degenerative changes of the hips. IMPRESSION: VASCULAR No acute vascular abnormality identified. Advanced aortic atherosclerosis. There are associated changes at the origin of all mesenteric vessel, though they remain patent. There may be 50 percent stenosis at the celiac artery origin, with greater than 50 percent stenosis of the superior mesenteric artery origin, and potentially greater than 50 percent stenosis at the origin of the IMA, although degree of stenosis cannot be determined by CTA. This pattern of disease certainly places the patient at risk for chronic mesenteric ischemia. Correlation with patient's history may be useful, and potentially mesenteric duplex exam if there is concern for compromise. Bilateral renal artery stenosis, potentially contributing to advanced renal cortical thinning. NON-VASCULAR Morphologic changes of the liver compatible with cirrhosis. Referral for establishing gastroenterology care may be considered if not already established, as well as consideration of initiating surveillance for hepatocellular carcinoma. Peritoneal dialysis catheter with free fluid likely associated with treatment. Tiny amount of anti dependent gas within the abdomen, again likely associated with the peritoneal dialysis.  Again, bilateral renal cortical cysts. While these are most likely benign, there is incomplete characterization given the size. Developing renal osteodystrophy. Signed, Dulcy Fanny. Earleen Newport, DO [facility] Radiology Electronically Signed   By: Corrie Mckusick D.O.    On: 04/01/2016 09:00     CBC  Recent Labs Lab 04/13/16 0554 04/14/16 0800 04/16/16 1253 04/17/16 0530  WBC 10.2 10.5 13.3* 13.5*  HGB 9.4* 10.1* 9.8* 10.5*  HCT 31.2* 34.3* 34.7* 36.8*  PLT 259 289 234 235  MCV 106.1* 106.9* 111.2* 109.5*  MCH 32.0 31.5 31.4 31.3  MCHC 30.1 29.4* 28.2* 28.5*  RDW 17.4* 17.8* 18.5* 18.1*    Chemistries   Recent Labs Lab 04/13/16 0554 04/14/16 0403 04/14/16 1209 04/16/16 1253 04/17/16 0530  NA 138 138  --  143 139  K 4.4 3.9  --  4.0 4.6  CL 102 100*  --  104 98*  CO2 18* 21*  --  23 25  GLUCOSE 135* 112*  --  66 73  BUN 40* 22*  --  36* 20  CREATININE 8.69* 5.30*  --  6.86* 4.46*  CALCIUM 8.5* 8.3*  --  8.7* 8.9  MG 2.0  --   --  2.5*  --   AST 41  --  55* 47* 56*  ALT 32  --  35 33 35  ALKPHOS 79  --  92 84 90  BILITOT 1.2  --  0.8 1.0 1.3*   ------------------------------------------------------------------------------------------------------------------ estimated creatinine clearance is 16.1 mL/min (by C-G formula based on SCr of 4.46 mg/dL (H)). ------------------------------------------------------------------------------------------------------------------ No results for input(s): HGBA1C in the last 72 hours. ------------------------------------------------------------------------------------------------------------------ No results for input(s): CHOL, HDL, LDLCALC, TRIG, CHOLHDL, LDLDIRECT in the last 72 hours. ------------------------------------------------------------------------------------------------------------------ No results for input(s): TSH, T4TOTAL, T3FREE, THYROIDAB in the last 72 hours.  Invalid input(s): FREET3 ------------------------------------------------------------------------------------------------------------------ No results for input(s): VITAMINB12, FOLATE, FERRITIN, TIBC, IRON, RETICCTPCT in the last 72 hours.  Coagulation profile  Recent Labs Lab 04/15/16 0340  INR 1.19    No  results for input(s): DDIMER in the last 72 hours.  Cardiac Enzymes No results for input(s): CKMB, TROPONINI, MYOGLOBIN in the last 168 hours.  Invalid input(s): CK ------------------------------------------------------------------------------------------------------------------ Invalid input(s): POCBNP   CBG: No results for input(s): GLUCAP in the last 168 hours.     Studies: No results found.    Lab Results  Component Value Date   HGBA1C 5.5 04/01/2016   HGBA1C 6.3 (H) 08/11/2013   Lab Results  Component Value Date   CREATININE 4.46 (H) 04/17/2016       Scheduled Meds: . aspirin EC  81 mg Oral Daily  . darbepoetin (ARANESP) injection - DIALYSIS  60 mcg Intravenous Q Sat-HD  . famotidine  20 mg Oral Daily  . feeding supplement  1 Container Oral TID BM  . fluconazole  200 mg Oral QHS  . heparin subcutaneous  5,000 Units Subcutaneous Q8H  . ipratropium  2 spray Each Nare BID  . lidocaine  1 patch Transdermal Q24H  . mouth rinse  15 mL Mouth Rinse BID  . multivitamin  1 tablet Oral QHS  . saccharomyces boulardii  250 mg Oral BID  . sodium chloride flush  3 mL Intravenous Q12H   Continuous Infusions:     LOS: 10 days    Taiylor Virden, MD, FACP, FHM. [facility] Pager [phone]  If 7PM-7AM, please contact night-coverage [email or web address] Password [email or web address] May 18, 2016, 7:48 AM

## 2016-04-22 DEATH — deceased

## 2016-05-07 ENCOUNTER — Ambulatory Visit: Payer: Medicare Other | Admitting: Oncology

## 2016-07-20 ENCOUNTER — Encounter: Payer: Self-pay | Admitting: Cardiology

## 2016-10-08 ENCOUNTER — Encounter: Payer: Medicare Other | Admitting: Adult Health

## 2016-10-08 ENCOUNTER — Ambulatory Visit: Payer: Medicare Other

## 2016-10-22 ENCOUNTER — Encounter (HOSPITAL_COMMUNITY): Payer: Medicare Other

## 2016-10-22 ENCOUNTER — Ambulatory Visit: Payer: Medicare Other | Admitting: Family

## 2019-02-07 ENCOUNTER — Encounter: Payer: Self-pay | Admitting: *Deleted

## 2019-02-07 NOTE — Progress Notes (Signed)
Oncology Nurse Navigator Documentation  Flowsheet/spreadsheet update.  Rick Jenevieve Kirschbaum, RN, BSN Head & Neck Oncology Nurse Navigator [facility] [place] [phone]
